# Patient Record
Sex: Male | Born: 1950
Health system: Southern US, Community
[De-identification: ages and names within clinical notes are randomized; demographics above are authoritative.]

## PROBLEM LIST (undated history)

## (undated) DIAGNOSIS — F329 Major depressive disorder, single episode, unspecified: Secondary | ICD-10-CM

## (undated) DIAGNOSIS — N189 Chronic kidney disease, unspecified: Secondary | ICD-10-CM

## (undated) DIAGNOSIS — Z9289 Personal history of other medical treatment: Secondary | ICD-10-CM

## (undated) DIAGNOSIS — E785 Hyperlipidemia, unspecified: Secondary | ICD-10-CM

## (undated) DIAGNOSIS — D472 Monoclonal gammopathy: Secondary | ICD-10-CM

## (undated) DIAGNOSIS — E669 Obesity, unspecified: Secondary | ICD-10-CM

## (undated) DIAGNOSIS — K589 Irritable bowel syndrome without diarrhea: Secondary | ICD-10-CM

## (undated) DIAGNOSIS — B009 Herpesviral infection, unspecified: Secondary | ICD-10-CM

## (undated) DIAGNOSIS — I10 Essential (primary) hypertension: Secondary | ICD-10-CM

## (undated) DIAGNOSIS — F419 Anxiety disorder, unspecified: Secondary | ICD-10-CM

## (undated) DIAGNOSIS — K219 Gastro-esophageal reflux disease without esophagitis: Secondary | ICD-10-CM

## (undated) DIAGNOSIS — C801 Malignant (primary) neoplasm, unspecified: Secondary | ICD-10-CM

## (undated) DIAGNOSIS — R519 Headache, unspecified: Secondary | ICD-10-CM

## (undated) DIAGNOSIS — R413 Other amnesia: Secondary | ICD-10-CM

## (undated) DIAGNOSIS — M109 Gout, unspecified: Secondary | ICD-10-CM

## (undated) DIAGNOSIS — F32A Depression, unspecified: Secondary | ICD-10-CM

## (undated) DIAGNOSIS — R350 Frequency of micturition: Secondary | ICD-10-CM

## (undated) DIAGNOSIS — I5030 Unspecified diastolic (congestive) heart failure: Secondary | ICD-10-CM

## (undated) DIAGNOSIS — T7840XA Allergy, unspecified, initial encounter: Secondary | ICD-10-CM

## (undated) DIAGNOSIS — I639 Cerebral infarction, unspecified: Secondary | ICD-10-CM

## (undated) DIAGNOSIS — R7303 Prediabetes: Secondary | ICD-10-CM

## (undated) DIAGNOSIS — R3915 Urgency of urination: Secondary | ICD-10-CM

## (undated) DIAGNOSIS — M1990 Unspecified osteoarthritis, unspecified site: Secondary | ICD-10-CM

## (undated) HISTORY — PX: VASECTOMY: SHX75

## (undated) HISTORY — PX: TONSILLECTOMY: SUR1361

## (undated) HISTORY — DX: Chronic kidney disease, unspecified: N18.9

## (undated) HISTORY — DX: Anxiety disorder, unspecified: F41.9

## (undated) HISTORY — DX: Gastro-esophageal reflux disease without esophagitis: K21.9

## (undated) HISTORY — DX: Hyperlipidemia, unspecified: E78.5

## (undated) HISTORY — PX: SPINAL FUSION: SHX223

## (undated) HISTORY — PX: WISDOM TOOTH EXTRACTION: SHX21

## (undated) HISTORY — PX: OTHER SURGICAL HISTORY: SHX169

## (undated) HISTORY — DX: Allergy, unspecified, initial encounter: T78.40XA

## (undated) HISTORY — DX: Obesity, unspecified: E66.9

## (undated) HISTORY — DX: Prediabetes: R73.03

## (undated) HISTORY — DX: Personal history of other medical treatment: Z92.89

## (undated) HISTORY — DX: Other amnesia: R41.3

## (undated) HISTORY — DX: Urgency of urination: R39.15

## (undated) HISTORY — DX: Frequency of micturition: R35.0

## (undated) HISTORY — DX: Monoclonal gammopathy: D47.2

## (undated) HISTORY — DX: Gout, unspecified: M10.9

---

## 2007-12-19 ENCOUNTER — Encounter: Admission: RE | Admit: 2007-12-19 | Discharge: 2007-12-19 | Payer: Self-pay | Admitting: Orthopedic Surgery

## 2008-01-30 ENCOUNTER — Ambulatory Visit: Payer: Self-pay | Admitting: *Deleted

## 2008-01-30 ENCOUNTER — Inpatient Hospital Stay (HOSPITAL_COMMUNITY): Admission: RE | Admit: 2008-01-30 | Discharge: 2008-02-01 | Payer: Self-pay | Admitting: Orthopedic Surgery

## 2008-01-31 ENCOUNTER — Encounter (INDEPENDENT_AMBULATORY_CARE_PROVIDER_SITE_OTHER): Payer: Self-pay | Admitting: Orthopedic Surgery

## 2009-12-19 ENCOUNTER — Inpatient Hospital Stay (HOSPITAL_COMMUNITY)
Admission: EM | Admit: 2009-12-19 | Discharge: 2009-12-23 | Payer: Self-pay | Source: Home / Self Care | Attending: Neurology | Admitting: Neurology

## 2009-12-21 ENCOUNTER — Encounter (INDEPENDENT_AMBULATORY_CARE_PROVIDER_SITE_OTHER): Payer: Self-pay | Admitting: Neurology

## 2010-02-08 ENCOUNTER — Encounter: Payer: Self-pay | Admitting: Orthopedic Surgery

## 2010-03-30 LAB — CK TOTAL AND CKMB (NOT AT ARMC)
CK, MB: 1.4 ng/mL (ref 0.3–4.0)
Relative Index: 0.8 (ref 0.0–2.5)
Total CK: 175 U/L (ref 7–232)

## 2010-03-30 LAB — GLUCOSE, CAPILLARY
Glucose-Capillary: 113 mg/dL — ABNORMAL HIGH (ref 70–99)
Glucose-Capillary: 94 mg/dL (ref 70–99)
Glucose-Capillary: 99 mg/dL (ref 70–99)

## 2010-03-30 LAB — COMPREHENSIVE METABOLIC PANEL
ALT: 32 U/L (ref 0–53)
AST: 28 U/L (ref 0–37)
Albumin: 4.2 g/dL (ref 3.5–5.2)
Alkaline Phosphatase: 45 U/L (ref 39–117)
BUN: 7 mg/dL (ref 6–23)
CO2: 22 mEq/L (ref 19–32)
Calcium: 8.7 mg/dL (ref 8.4–10.5)
Chloride: 100 mEq/L (ref 96–112)
Creatinine, Ser: 0.9 mg/dL (ref 0.4–1.5)
GFR calc Af Amer: >60 mL/min (ref >60)
GFR calc non Af Amer: >60 mL/min (ref >60)
Glucose, Bld: 100 mg/dL — ABNORMAL HIGH (ref 70–99)
Potassium: 3.7 mEq/L (ref 3.5–5.1)
Sodium: 136 mEq/L (ref 135–145)
Total Bilirubin: 1.1 mg/dL (ref 0.3–1.2)
Total Protein: 7.1 g/dL (ref 6.0–8.3)

## 2010-03-30 LAB — PROTIME-INR
INR: 1.04 (ref 0.00–1.49)
Prothrombin Time: 13.8 seconds (ref 11.6–15.2)

## 2010-03-30 LAB — LIPID PANEL
Cholesterol: 226 mg/dL — ABNORMAL HIGH (ref 0–200)
HDL: 28 mg/dL — ABNORMAL LOW (ref >39)
LDL Cholesterol: 149 mg/dL — ABNORMAL HIGH (ref 0–99)
Total CHOL/HDL Ratio: 8.1 RATIO
Triglycerides: 244 mg/dL — ABNORMAL HIGH (ref <150)
VLDL: 49 mg/dL — ABNORMAL HIGH (ref 0–40)

## 2010-03-30 LAB — CBC
HCT: 41.9 % (ref 39.0–52.0)
Hemoglobin: 14.7 g/dL (ref 13.0–17.0)
MCH: 30.6 pg (ref 26.0–34.0)
MCHC: 35.1 g/dL (ref 30.0–36.0)
MCV: 87.1 fL (ref 78.0–100.0)
Platelets: 298 10*3/uL (ref 150–400)
RBC: 4.81 MIL/uL (ref 4.22–5.81)
RDW: 12.5 % (ref 11.5–15.5)
WBC: 5.8 10*3/uL (ref 4.0–10.5)

## 2010-03-30 LAB — DIFFERENTIAL
Basophils Absolute: 0 10*3/uL (ref 0.0–0.1)
Basophils Relative: 0 % (ref 0–1)
Eosinophils Absolute: 0.1 10*3/uL (ref 0.0–0.7)
Eosinophils Relative: 2 % (ref 0–5)
Lymphocytes Relative: 34 % (ref 12–46)
Lymphs Abs: 2 10*3/uL (ref 0.7–4.0)
Monocytes Absolute: 0.6 10*3/uL (ref 0.1–1.0)
Monocytes Relative: 10 % (ref 3–12)
Neutro Abs: 3.2 10*3/uL (ref 1.7–7.7)
Neutrophils Relative %: 55 % (ref 43–77)

## 2010-03-30 LAB — MRSA PCR SCREENING: MRSA by PCR: NEGATIVE

## 2010-03-30 LAB — APTT: aPTT: 34 seconds (ref 24–37)

## 2010-03-30 LAB — TROPONIN I: Troponin I: 0.01 ng/mL (ref 0.00–0.06)

## 2010-03-30 LAB — HEMOGLOBIN A1C
Hgb A1c MFr Bld: 5.8 % — ABNORMAL HIGH (ref <5.7)
Mean Plasma Glucose: 120 mg/dL — ABNORMAL HIGH (ref <117)

## 2010-05-03 LAB — BASIC METABOLIC PANEL
BUN: 10 mg/dL (ref 6–23)
BUN: 11 mg/dL (ref 6–23)
BUN: 15 mg/dL (ref 6–23)
CO2: 26 mEq/L (ref 19–32)
CO2: 27 mEq/L (ref 19–32)
CO2: 28 mEq/L (ref 19–32)
Calcium: 8.3 mg/dL — ABNORMAL LOW (ref 8.4–10.5)
Calcium: 8.8 mg/dL (ref 8.4–10.5)
Calcium: 9.4 mg/dL (ref 8.4–10.5)
Chloride: 101 mEq/L (ref 96–112)
Chloride: 105 mEq/L (ref 96–112)
Chloride: 99 mEq/L (ref 96–112)
Creatinine, Ser: 0.93 mg/dL (ref 0.4–1.5)
Creatinine, Ser: 0.97 mg/dL (ref 0.4–1.5)
Creatinine, Ser: 0.98 mg/dL (ref 0.4–1.5)
GFR calc Af Amer: >60 mL/min (ref >60)
GFR calc Af Amer: >60 mL/min (ref >60)
GFR calc Af Amer: >60 mL/min (ref >60)
GFR calc non Af Amer: >60 mL/min (ref >60)
GFR calc non Af Amer: >60 mL/min (ref >60)
GFR calc non Af Amer: >60 mL/min (ref >60)
Glucose, Bld: 113 mg/dL — ABNORMAL HIGH (ref 70–99)
Glucose, Bld: 137 mg/dL — ABNORMAL HIGH (ref 70–99)
Glucose, Bld: 65 mg/dL — ABNORMAL LOW (ref 70–99)
Potassium: 3.5 mEq/L (ref 3.5–5.1)
Potassium: 3.9 mEq/L (ref 3.5–5.1)
Potassium: 4.1 mEq/L (ref 3.5–5.1)
Sodium: 137 mEq/L (ref 135–145)
Sodium: 137 mEq/L (ref 135–145)
Sodium: 138 mEq/L (ref 135–145)

## 2010-05-03 LAB — CBC
HCT: 35.1 % — ABNORMAL LOW (ref 39.0–52.0)
HCT: 35.5 % — ABNORMAL LOW (ref 39.0–52.0)
HCT: 46.7 % (ref 39.0–52.0)
Hemoglobin: 12.3 g/dL — ABNORMAL LOW (ref 13.0–17.0)
Hemoglobin: 12.4 g/dL — ABNORMAL LOW (ref 13.0–17.0)
Hemoglobin: 15.9 g/dL (ref 13.0–17.0)
MCHC: 34.1 g/dL (ref 30.0–36.0)
MCHC: 34.6 g/dL (ref 30.0–36.0)
MCHC: 35.2 g/dL (ref 30.0–36.0)
MCV: 90.1 fL (ref 78.0–100.0)
MCV: 90.9 fL (ref 78.0–100.0)
MCV: 90.9 fL (ref 78.0–100.0)
Platelets: 298 10*3/uL (ref 150–400)
Platelets: 305 10*3/uL (ref 150–400)
Platelets: 368 10*3/uL (ref 150–400)
RBC: 3.9 MIL/uL — ABNORMAL LOW (ref 4.22–5.81)
RBC: 3.9 MIL/uL — ABNORMAL LOW (ref 4.22–5.81)
RBC: 5.14 MIL/uL (ref 4.22–5.81)
RDW: 12.7 % (ref 11.5–15.5)
RDW: 12.7 % (ref 11.5–15.5)
RDW: 12.9 % (ref 11.5–15.5)
WBC: 11.1 10*3/uL — ABNORMAL HIGH (ref 4.0–10.5)
WBC: 5.5 10*3/uL (ref 4.0–10.5)
WBC: 9.9 10*3/uL (ref 4.0–10.5)

## 2010-05-03 LAB — TYPE AND SCREEN
ABO/RH(D): A POS
Antibody Screen: NEGATIVE

## 2010-05-03 LAB — ABO/RH: ABO/RH(D): A POS

## 2010-06-01 NOTE — Op Note (Signed)
[name], TOMPSON NO.:  [id number]   MEDICAL RECORD NO.:  [id number]          PATIENT TYPE:  INP   LOCATION:  5017                         FACILITY:  [facility]   PHYSICIAN:  Alvy Beal, MD    DATE OF BIRTH:  [birth year]   DATE OF PROCEDURE:  01/30/2008  DATE OF DISCHARGE:                               OPERATIVE REPORT   PREOPERATIVE DIAGNOSIS:  Degenerative lumbar disk disease, L5-S1.   POSTOPERATIVE DIAGNOSIS:  Degenerative lumbar disk disease, L5-S1.   OPERATIVE PROCEDURE:  Anterior lumbar interbody fusion.   APPROACH SURGEON:  Balinda Quails, MD   FIRST ASSISTANT:  Crissie Reese, PA   HISTORY:  This is a very pleasant 60 year old gentleman who presents to  my care suffering with longstanding low back pain.  Attempts at  conservative management consisting of various types of injection  therapy, chiropractic treatment, and physical therapy had failed to  alleviate his symptoms.  As a result of progression of his pain, he  elected to proceed with surgery.  All appropriate risks, benefits, and  alternatives of surgery were discussed with the patient and consent was  obtained.   OPERATIVE NOTE:  The patient was brought to the operating room and  placed supine on the operating table.  After successful induction of  general anesthesia and endotracheal intubation, TEDs, SCDs, and Foley  were applied.  The anterior abdomen was then properly positioned and the  abdomen was prepped and draped in appropriate standard fashion.  Please  refer to Dr. Madilyn Fireman' note for specifics on the approach.   Once the self-retaining Thompson retractor blades were positioned and  the retroperitoneal approach was completed, I scrubbed into the case.  At this point, the iliac vessels were properly mobilized and I could  clearly visualize the L5-S1 disk space.  I confirmed with lateral x-ray  that this was the appropriate level.  I then incised the annulus with a  10-blade  scalpel.  I then used a curette, Kerrison rongeurs, and  pituitary rongeurs to remove the bulk of the disk material.  Once I had  mostly diskectomy completed, I then distracted the interbody space and  used fine curettes to continue my diskectomy work posteriorly.  I then  identified the posterior annulus and the posterior aspect of the  vertebral bodies.  At this point, I used a fine curette to get behind  the posterior body of L5 and S1 and stripped the annulus.  I then used a  3-mm long-handle Kerrison rongeur to resect the posterior osteophytes  from the vertebral bodies of L5 and S1.  I then went into the lateral  gutter to ensure that I had a complete diskectomy and I resected any of  the overhanging osteophytes.  At this point with the diskectomy  complete, I irrigated copiously with normal saline.   At this point, I rasped the endplates appropriately and measured the  interbody space.  I elected to use based on the trial prosthesis a 14, 8-  degree lordotic Stryker interbody PEEK cage.  I then obtained the  appropriate size cage, packed with Actifuse and used the atraumatic  pusher to insert the device.  I was able to countersink at approximately  6 mm.  I then checked the posterior depth to ensure that it was not too  far posteriorly.   At this point with the graft properly positioned, I then irrigated  copiously with normal saline.  I then took the RSB size 15 anterior zero  profile plating system and trialed it over the prosthesis.  I then took  the remaining portion of Actifuse, packed it anterior to the graft, and  then put the plate on.  I then used an awl to pop through the endplate  and I placed a 2.5 screw.  I placed 2 superior and 1 inferiorly.  I had  excellent fixation and all screws had excellent purchase.  I then took  the locking caps and secured it over.   At this point in time, I irrigated it copiously and then sequentially  removed the self-retaining  retractors to ensure there was no  hemorrhaging.  With the wound being dry, I then closed the fascia of the  rectus sheath with a running #1 Vicryl suture, superficial with 2-0, and  a 3-0 Monocryl for the skin.  Steri-Strips and a dry dressing were  applied.  Intraoperative final AP and lateral x-rays showed satisfactory  position of the graft and hardware and a final AP digital view indicated  that there was no surgical equipment left in the wound other than the  appropriate hardware that I had placed.   At the end of the case, the patient was extubated and transferred to  PACU without incident.      Alvy Beal, MD  Electronically Signed     DDB/MEDQ  D:  01/30/2008  T:  01/30/2008  Job:  [id number]

## 2010-10-17 ENCOUNTER — Other Ambulatory Visit: Payer: Self-pay

## 2010-10-17 ENCOUNTER — Emergency Department (INDEPENDENT_AMBULATORY_CARE_PROVIDER_SITE_OTHER): Payer: BC Managed Care – PPO

## 2010-10-17 ENCOUNTER — Emergency Department (HOSPITAL_BASED_OUTPATIENT_CLINIC_OR_DEPARTMENT_OTHER)
Admission: EM | Admit: 2010-10-17 | Discharge: 2010-10-17 | Disposition: A | Discharge status: Home / Self Care | Payer: BC Managed Care – PPO | Attending: Emergency Medicine | Admitting: Emergency Medicine

## 2010-10-17 ENCOUNTER — Encounter: Payer: Self-pay | Admitting: *Deleted

## 2010-10-17 DIAGNOSIS — R209 Unspecified disturbances of skin sensation: Secondary | ICD-10-CM

## 2010-10-17 DIAGNOSIS — R4182 Altered mental status, unspecified: Secondary | ICD-10-CM

## 2010-10-17 DIAGNOSIS — I6789 Other cerebrovascular disease: Secondary | ICD-10-CM

## 2010-10-17 DIAGNOSIS — G319 Degenerative disease of nervous system, unspecified: Secondary | ICD-10-CM

## 2010-10-17 DIAGNOSIS — Z79899 Other long term (current) drug therapy: Secondary | ICD-10-CM | POA: Insufficient documentation

## 2010-10-17 DIAGNOSIS — G459 Transient cerebral ischemic attack, unspecified: Secondary | ICD-10-CM

## 2010-10-17 HISTORY — DX: Cerebral infarction, unspecified: I63.9

## 2010-10-17 HISTORY — DX: Depression, unspecified: F32.A

## 2010-10-17 HISTORY — DX: Herpesviral infection, unspecified: B00.9

## 2010-10-17 HISTORY — DX: Major depressive disorder, single episode, unspecified: F32.9

## 2010-10-17 HISTORY — DX: Essential (primary) hypertension: I10

## 2010-10-17 LAB — CBC
HCT: 38.8 % — ABNORMAL LOW (ref 39.0–52.0)
Hemoglobin: 13.5 g/dL (ref 13.0–17.0)
MCH: 30.3 pg (ref 26.0–34.0)
MCHC: 34.8 g/dL (ref 30.0–36.0)
MCV: 87.2 fL (ref 78.0–100.0)
Platelets: 295 10*3/uL (ref 150–400)
RBC: 4.45 MIL/uL (ref 4.22–5.81)
RDW: 11.9 % (ref 11.5–15.5)
WBC: 6.5 10*3/uL (ref 4.0–10.5)

## 2010-10-17 LAB — PROTIME-INR
INR: 1.03 (ref 0.00–1.49)
Prothrombin Time: 13.7 seconds (ref 11.6–15.2)

## 2010-10-17 LAB — BASIC METABOLIC PANEL
BUN: 17 mg/dL (ref 6–23)
CO2: 28 mEq/L (ref 19–32)
Calcium: 9.2 mg/dL (ref 8.4–10.5)
Chloride: 102 mEq/L (ref 96–112)
Creatinine, Ser: 1.2 mg/dL (ref 0.50–1.35)
GFR calc Af Amer: >60 mL/min (ref >60)
GFR calc non Af Amer: >60 mL/min (ref >60)
Glucose, Bld: 102 mg/dL — ABNORMAL HIGH (ref 70–99)
Potassium: 3.3 mEq/L — ABNORMAL LOW (ref 3.5–5.1)
Sodium: 139 mEq/L (ref 135–145)

## 2010-10-17 NOTE — ED Notes (Signed)
Tingling and numbness on right side, but more in his arm. Went to bed at 11pm and woke up appx 1am with symptoms.

## 2010-10-17 NOTE — ED Provider Notes (Signed)
History     CSN: [id number] Arrival date & time: 10/17/2010  2:56 AM  Chief Complaint  Patient presents with  . Numbness    (Consider location/radiation/quality/duration/timing/severity/associated sxs/prior treatment) HPI Patient reports the development of numbness and tingling of his right arm and his right leg 11 PM on 10/16/2010 approximately 4 hours prior to arrival.  He reports a history of a stroke affecting his left side and developed left-sided weakness in December 2011 at which point TPA was given with resolution of his symptoms.  He is an intolerance to aspirin therefore is on 75 mg of Plavix daily and has been tolerating this and taking it as prescribed.  He reports he had similar symptoms approximately 2 days ago they were transient in nature and resolved on their own.  Given the development of his symptoms again this evening he decided to seek care in the emergency department.  He reports a numbness and tingling is still present.  Family with him denies facial asymmetry speech difficulties or confusion.  Patient denies any weakness of his upper extremities or lower extremities.  His wife reports no facial droop.  No ataxia described the patient will report by spouse.  Patient denies headache or chest pain.  He denies shortness of breath or palpitations.  Denies neck pain denies abdominal pain.  Denies fever chills nausea vomiting or diarrhea.   Past Medical History  Diagnosis Date  . Stroke   . Depression   . Hypertension   . Herpes     Past Surgical History  Procedure Date  . Spinal fusion   . Vasectomy     No family history on file.  History  Substance Use Topics  . Smoking status: Never Smoker   . Smokeless tobacco: Not on file  . Alcohol Use: No      Review of Systems  All other systems reviewed and are negative.    Allergies  Aspirin and Oxycodone  Home Medications   Current Outpatient Rx  Name Route Sig Dispense Refill  . AMLODIPINE BESYLATE 5 MG  PO TABS Oral Take 5 mg by mouth daily.      . BUSPIRONE HCL 15 MG PO TABS Oral Take 15 mg by mouth 3 (three) times daily.      Marland Kitchen CLOPIDOGREL BISULFATE 75 MG PO TABS Oral Take 75 mg by mouth daily.      . ETONOGESTREL-ETHINYL ESTRADIOL 0.12-0.015 MG/24HR VA RING Vaginal Place 1 each vaginally every 28 (twenty-eight) days. Insert vaginally and leave in place for 3 consecutive weeks, then remove for 1 week.     Marland Kitchen HYDROXYZINE HCL 25 MG PO TABS Oral Take 25 mg by mouth 3 (three) times daily as needed.      Marland Kitchen OMEPRAZOLE 20 MG PO CPDR Oral Take 20 mg by mouth daily.      Marland Kitchen PAROXETINE HCL 40 MG PO TABS Oral Take 60 mg by mouth every morning.      Marland Kitchen QUETIAPINE FUMARATE 100 MG PO TABS Oral Take 150 mg by mouth at bedtime.      Marland Kitchen VALACYCLOVIR HCL 1 G PO TABS Oral Take 1,000 mg by mouth 2 (two) times daily.        BP 120/73  Pulse 74  Temp(Src) 97.7 F (36.5 C) (Oral)  Resp 19  SpO2 97%  Physical Exam  Nursing note and vitals reviewed. Constitutional: He is oriented to person, place, and time. He appears well-developed and well-nourished.  HENT:  Head: Normocephalic and atraumatic.  Eyes:  EOM are normal. Pupils are equal, round, and reactive to light.  Neck: Normal range of motion.  Cardiovascular: Normal rate and regular rhythm.   Pulmonary/Chest: Effort normal.  Abdominal: Soft.  Neurological: He is alert and oriented to person, place, and time.       5/5 strength in major muscle groups of  bilateral upper and lower extremities. Speech normal. No facial asymetry.  Normal finger to nose bilaterally.  Patient does report decreased sensation to fine touch on his right upper lower extremity  Skin: Skin is warm and dry.  Psychiatric: He has a normal mood and affect.    ED Course  Procedures (including critical care time)  Labs Reviewed  CBC - Abnormal; Notable for the following:    HCT 38.8 (*)    All other components within normal limits  BASIC METABOLIC PANEL - Abnormal; Notable for the  following:    Potassium 3.3 (*)    Glucose, Bld 102 (*)    All other components within normal limits  PROTIME-INR   Ct Head Wo Contrast  10/17/2010  *RADIOLOGY REPORT*  Clinical Data: Altered mental status.  Tingling and numbness on the right side.  CT HEAD WITHOUT CONTRAST  Technique:  Contiguous axial images were obtained from the base of the skull through the vertex without contrast.  Comparison: 12/19/2009  Findings: The ventricles and sulci appear symmetrical.  No mass effect or midline shift.  No significant ventricular dilatation. Mild diffuse cerebral atrophy.  Low attenuation changes in the deep white matter suggesting small vessel ischemia.  Gray-white matter junctions are distinct.  Basal cisterns are not effaced.  No abnormal extra-axial fluid collections.  No evidence of acute intracranial hemorrhage.  No depressed skull fractures.  Visualized paranasal sinuses are not opacified.  IMPRESSION: Mild atrophy and small vessel ischemic change.  No evidence of acute intracranial hemorrhage, mass lesion, or acute infarct.  Original Report Authenticated By: Marlon Pel, M.D.     1. TIA (transient ischemic attack)      Date: 10/17/2010  Rate: 68  Rhythm: normal sinus rhythm  QRS Axis: normal  Intervals: normal  ST/T Wave abnormalities: normal  Conduction Disutrbances:none  Narrative Interpretation:   Old EKG Reviewed: unchanged    MDM  This is just over 4 hours out from the onset of his possible strokelike symptoms.  He is not a candidate for TPA at this time given his NIH stroke scale of 1 and greater than 3 hours.  EKG demonstrates normal sinus rhythm.  We'll obtain labs and head CT.   These are they on Plavix at this time denies smoking.  He reports well controlled hypertension hyperlipidemia.   4:45 AM Labs and head CT normal.  Patient reports resolution of all of his symptoms including numbness tingling.  He has developed no new symptoms since arrival to the emergency  department.  I reviewed the patient's prior hospitalization.  I spoke with the patient and his wife at length regarding the possibility of a TIA.  They understand.  He is currently on Plavix.  His carotid ultrasound during his last hospitalization demonstrated just mild plaque disease.  The patient and his spouse arrange to go home.  He has access to a car and they were both informed and intelligent individuals.  They will return immediately to [facility] emergency department if he develops any new or worsening symptoms.         Lyanne Co, MD 10/17/10 [phone]

## 2010-10-29 ENCOUNTER — Emergency Department (HOSPITAL_COMMUNITY)
Admission: EM | Admit: 2010-10-29 | Discharge: 2010-10-30 | Disposition: A | Discharge status: Other Acute Inpatient Hospital | Payer: BC Managed Care – PPO | Attending: Emergency Medicine | Admitting: Emergency Medicine

## 2010-10-29 ENCOUNTER — Ambulatory Visit (HOSPITAL_COMMUNITY)
Admission: RE | Admit: 2010-10-29 | Discharge: 2010-10-29 | Disposition: A | Discharge status: Home / Self Care | Payer: BC Managed Care – PPO | Source: Ambulatory Visit | Attending: Psychiatry | Admitting: Psychiatry

## 2010-10-29 DIAGNOSIS — Z79899 Other long term (current) drug therapy: Secondary | ICD-10-CM | POA: Insufficient documentation

## 2010-10-29 DIAGNOSIS — F329 Major depressive disorder, single episode, unspecified: Secondary | ICD-10-CM | POA: Insufficient documentation

## 2010-10-29 DIAGNOSIS — F3289 Other specified depressive episodes: Secondary | ICD-10-CM | POA: Insufficient documentation

## 2010-10-29 DIAGNOSIS — Z8673 Personal history of transient ischemic attack (TIA), and cerebral infarction without residual deficits: Secondary | ICD-10-CM | POA: Insufficient documentation

## 2010-10-29 DIAGNOSIS — F39 Unspecified mood [affective] disorder: Secondary | ICD-10-CM | POA: Insufficient documentation

## 2010-10-29 DIAGNOSIS — Z7902 Long term (current) use of antithrombotics/antiplatelets: Secondary | ICD-10-CM | POA: Insufficient documentation

## 2010-10-29 DIAGNOSIS — I10 Essential (primary) hypertension: Secondary | ICD-10-CM | POA: Insufficient documentation

## 2010-10-29 DIAGNOSIS — R45851 Suicidal ideations: Secondary | ICD-10-CM | POA: Insufficient documentation

## 2010-10-29 LAB — COMPREHENSIVE METABOLIC PANEL
ALT: 20 U/L (ref 0–53)
ALT: 17 U/L (ref 0–53)
AST: 19 U/L (ref 0–37)
AST: 16 U/L (ref 0–37)
Albumin: 4.1 g/dL (ref 3.5–5.2)
Albumin: 3.5 g/dL (ref 3.5–5.2)
Alkaline Phosphatase: 38 U/L — ABNORMAL LOW (ref 39–117)
Alkaline Phosphatase: 33 U/L — ABNORMAL LOW (ref 39–117)
BUN: 10 mg/dL (ref 6–23)
BUN: 11 mg/dL (ref 6–23)
CO2: 29 mEq/L (ref 19–32)
CO2: 30 mEq/L (ref 19–32)
Calcium: 9.2 mg/dL (ref 8.4–10.5)
Calcium: 8.8 mg/dL (ref 8.4–10.5)
Chloride: 101 mEq/L (ref 96–112)
Chloride: 102 mEq/L (ref 96–112)
Creatinine, Ser: 1 mg/dL (ref 0.50–1.35)
Creatinine, Ser: 1.04 mg/dL (ref 0.50–1.35)
GFR calc Af Amer: >90 mL/min (ref >90)
GFR calc Af Amer: 90 mL/min — ABNORMAL LOW (ref >90)
GFR calc non Af Amer: 81 mL/min — ABNORMAL LOW (ref >90)
GFR calc non Af Amer: 77 mL/min — ABNORMAL LOW (ref >90)
Glucose, Bld: 91 mg/dL (ref 70–99)
Glucose, Bld: 89 mg/dL (ref 70–99)
Potassium: 3 mEq/L — ABNORMAL LOW (ref 3.5–5.1)
Potassium: 3.1 mEq/L — ABNORMAL LOW (ref 3.5–5.1)
Sodium: 139 mEq/L (ref 135–145)
Sodium: 139 mEq/L (ref 135–145)
Total Bilirubin: 0.2 mg/dL — ABNORMAL LOW (ref 0.3–1.2)
Total Bilirubin: 0.2 mg/dL — ABNORMAL LOW (ref 0.3–1.2)
Total Protein: 7.6 g/dL (ref 6.0–8.3)
Total Protein: 6.7 g/dL (ref 6.0–8.3)

## 2010-10-29 LAB — DIFFERENTIAL
Basophils Absolute: 0 10*3/uL (ref 0.0–0.1)
Basophils Relative: 0 % (ref 0–1)
Eosinophils Absolute: 0.2 10*3/uL (ref 0.0–0.7)
Eosinophils Relative: 2 % (ref 0–5)
Lymphocytes Relative: 22 % (ref 12–46)
Lymphs Abs: 1.6 10*3/uL (ref 0.7–4.0)
Monocytes Absolute: 0.6 10*3/uL (ref 0.1–1.0)
Monocytes Relative: 8 % (ref 3–12)
Neutro Abs: 4.9 10*3/uL (ref 1.7–7.7)
Neutrophils Relative %: 68 % (ref 43–77)

## 2010-10-29 LAB — RAPID URINE DRUG SCREEN, HOSP PERFORMED
Amphetamines: NOT DETECTED
Barbiturates: NOT DETECTED
Benzodiazepines: POSITIVE — AB
Cocaine: NOT DETECTED
Opiates: NOT DETECTED
Tetrahydrocannabinol: NOT DETECTED

## 2010-10-29 LAB — CBC
HCT: 41.9 % (ref 39.0–52.0)
Hemoglobin: 14.2 g/dL (ref 13.0–17.0)
MCH: 29.6 pg (ref 26.0–34.0)
MCHC: 33.9 g/dL (ref 30.0–36.0)
MCV: 87.5 fL (ref 78.0–100.0)
Platelets: 339 10*3/uL (ref 150–400)
RBC: 4.79 MIL/uL (ref 4.22–5.81)
RDW: 12.3 % (ref 11.5–15.5)
WBC: 7.2 10*3/uL (ref 4.0–10.5)

## 2010-10-29 LAB — ETHANOL: Alcohol, Ethyl (B): <11 mg/dL (ref 0–11)

## 2010-10-29 LAB — CARBAMAZEPINE LEVEL, TOTAL: Carbamazepine Lvl: 2 ug/mL — ABNORMAL LOW (ref 4.0–12.0)

## 2010-10-30 ENCOUNTER — Inpatient Hospital Stay (HOSPITAL_COMMUNITY)
Admission: RE | Admit: 2010-10-30 | Discharge: 2010-11-08 | DRG: 430 | Disposition: A | Discharge status: Home / Self Care | Payer: BC Managed Care – PPO | Source: Ambulatory Visit | Attending: Psychiatry | Admitting: Psychiatry

## 2010-10-30 DIAGNOSIS — Z79899 Other long term (current) drug therapy: Secondary | ICD-10-CM

## 2010-10-30 DIAGNOSIS — Z8673 Personal history of transient ischemic attack (TIA), and cerebral infarction without residual deficits: Secondary | ICD-10-CM

## 2010-10-30 DIAGNOSIS — Z7902 Long term (current) use of antithrombotics/antiplatelets: Secondary | ICD-10-CM

## 2010-10-30 DIAGNOSIS — E785 Hyperlipidemia, unspecified: Secondary | ICD-10-CM

## 2010-10-30 DIAGNOSIS — I10 Essential (primary) hypertension: Secondary | ICD-10-CM

## 2010-10-30 DIAGNOSIS — F339 Major depressive disorder, recurrent, unspecified: Principal | ICD-10-CM

## 2010-10-30 DIAGNOSIS — F411 Generalized anxiety disorder: Secondary | ICD-10-CM

## 2010-10-30 DIAGNOSIS — I739 Peripheral vascular disease, unspecified: Secondary | ICD-10-CM

## 2010-10-30 DIAGNOSIS — R45851 Suicidal ideations: Secondary | ICD-10-CM

## 2010-10-31 DIAGNOSIS — F339 Major depressive disorder, recurrent, unspecified: Secondary | ICD-10-CM

## 2010-10-31 DIAGNOSIS — F411 Generalized anxiety disorder: Secondary | ICD-10-CM

## 2010-10-31 LAB — BASIC METABOLIC PANEL
BUN: 9 mg/dL (ref 6–23)
CO2: 30 mEq/L (ref 19–32)
Calcium: 9.2 mg/dL (ref 8.4–10.5)
Chloride: 102 mEq/L (ref 96–112)
Creatinine, Ser: 1.01 mg/dL (ref 0.50–1.35)
GFR calc Af Amer: >90 mL/min (ref >90)
GFR calc non Af Amer: 80 mL/min — ABNORMAL LOW (ref >90)
Glucose, Bld: 94 mg/dL (ref 70–99)
Potassium: 3.4 mEq/L — ABNORMAL LOW (ref 3.5–5.1)
Sodium: 139 mEq/L (ref 135–145)

## 2010-11-01 LAB — CBC
HCT: 39.5 % (ref 39.0–52.0)
Hemoglobin: 13.3 g/dL (ref 13.0–17.0)
MCH: 30 pg (ref 26.0–34.0)
MCHC: 33.7 g/dL (ref 30.0–36.0)
MCV: 89.2 fL (ref 78.0–100.0)
Platelets: 346 10*3/uL (ref 150–400)
RBC: 4.43 MIL/uL (ref 4.22–5.81)
RDW: 12 % (ref 11.5–15.5)
WBC: 7.9 10*3/uL (ref 4.0–10.5)

## 2010-11-01 LAB — DIFFERENTIAL
Basophils Absolute: 0 10*3/uL (ref 0.0–0.1)
Basophils Relative: 0 % (ref 0–1)
Eosinophils Absolute: 0.2 10*3/uL (ref 0.0–0.7)
Eosinophils Relative: 2 % (ref 0–5)
Lymphocytes Relative: 26 % (ref 12–46)
Lymphs Abs: 2.1 10*3/uL (ref 0.7–4.0)
Monocytes Absolute: 0.7 10*3/uL (ref 0.1–1.0)
Monocytes Relative: 9 % (ref 3–12)
Neutro Abs: 5 10*3/uL (ref 1.7–7.7)
Neutrophils Relative %: 63 % (ref 43–77)

## 2010-11-01 LAB — COMPREHENSIVE METABOLIC PANEL
ALT: 15 U/L (ref 0–53)
AST: 14 U/L (ref 0–37)
Albumin: 3.6 g/dL (ref 3.5–5.2)
Alkaline Phosphatase: 38 U/L — ABNORMAL LOW (ref 39–117)
BUN: 8 mg/dL (ref 6–23)
CO2: 29 mEq/L (ref 19–32)
Calcium: 8.9 mg/dL (ref 8.4–10.5)
Chloride: 102 mEq/L (ref 96–112)
Creatinine, Ser: 1.04 mg/dL (ref 0.50–1.35)
GFR calc Af Amer: 90 mL/min — ABNORMAL LOW (ref >90)
GFR calc non Af Amer: 77 mL/min — ABNORMAL LOW (ref >90)
Glucose, Bld: 101 mg/dL — ABNORMAL HIGH (ref 70–99)
Potassium: 3.2 mEq/L — ABNORMAL LOW (ref 3.5–5.1)
Sodium: 140 mEq/L (ref 135–145)
Total Bilirubin: 0.1 mg/dL — ABNORMAL LOW (ref 0.3–1.2)
Total Protein: 7.2 g/dL (ref 6.0–8.3)

## 2010-11-01 LAB — TSH: TSH: 2.46 u[IU]/mL (ref 0.350–4.500)

## 2010-11-01 LAB — T4, FREE: Free T4: 0.99 ng/dL (ref 0.80–1.80)

## 2010-11-01 LAB — T3, FREE: T3, Free: 2.6 pg/mL (ref 2.3–4.2)

## 2010-11-01 NOTE — Assessment & Plan Note (Signed)
NAMEMARQUON, [name] NO.:  [id number]  MEDICAL RECORD NO.:  [id number]  LOCATION:  0504                          [facility]ILITY[facility]  [facility]  PHYSICIAN:  Franchot Gallo, MD     DATE OF BIRTH:  Oct 19, 1950  DATE OF ADMISSION:  10/30/2010 DATE OF DISCHARGE:                      PSYCHIATRIC ADMISSION ASSESSMENT   This is a voluntary admission to the services of Dr. Harvie Heck Fabricio Endsley. This is a 60 year old married African American male.  He has been in outpatient care with Dr. Betti Cruz.  He has had a variety of med changes, especially since he was diagnosed with a stroke, and this would have been December 2011.  He suffered a right basal ganglia and corona radiata infarct secondary to small vessel disease.  He apparently he told his wife "he knew how to do it" and his new plan was to overdose on his meds.  He had already given his firearms to a fraternity brother months ago.  He has been having increased sadness and crying despite med changes and this current exacerbation seems to have been triggered by the purchase of a vehicle that he now regrets.  PAST PSYCHIATRIC HISTORY:  It is a little bit difficult to get him pinned down.  He kind of wanders around and gets lost here, but apparently 10 years ago he was admitted to a hospital in [place] for suicidal threat.  It is unclear what happened after that, but for the past 3 years he has been in outpatient care with Dr. Betti Cruz.  SOCIAL HISTORY:  He does have his masters in education.  He has been married once.  He has a daughter, 49 and a stepson, 87.  He has been receiving SSDI since his stroke and he "hates it.Marland Kitchen  FAMILY HISTORY:  He says his father and brothers and his uncles were all alcoholics.  His own alcohol and drug history is not an issue.  MEDICAL PROBLEMS:  He is status post a right basal ganglia and corona radiata infarct secondary to small vessel disease.  This would have been this past December.  Hypertension,  hyperlipidemia.  He has a history for statin intolerance.  CURRENTLY PRESCRIBED MEDICINES:  He is prescribed: 1. Hydroxyzine 25 mg t.i.d. 2. Hydrochlorothiazide 25 mg p.o. q.a.m. 3. Equetro 300 mg at h.s. 4. Clopidogrel 75 mg 1 tab at h.s. 5. Clomid citrate 50 mg 1 tab p.o. q.a.m. 6. BuSpar 15 mg t.i.d. 7. Omeprazole 20 mg 1 p.o. daily . 8. Crestor 5 mg at h.s. 9. Valium 5 mg t.i.d. p.r.n. 10.Losartan 50 mg 1 p.o. q.a.m. 11.Mirtazapine 45 mg at h.s. 12.Paxil 60 mg at h.s.  DRUG ALLERGIES:  To aspirin; it causes shortness of breath.  POSITIVE PHYSICAL FINDINGS:  He was medically cleared in the ED at [facility].  He was afebrile, 98.1 to 98.7, his pulse was 61 to 75, respirations were 16 to 20, and blood pressure ranged from 103/61 to 112/76.  His UDS was positive for benzos, as expected, as he is prescribed.  His CBC had no abnormalities.  His BMET showed that his potassium was 3.0.  It was repeated; it was still 3.1 despite having been given a dose of potassium chloride.  He had no measurable alcohol.  MENTAL STATUS EXAMINATION:  He is seen in conjunction with Dr. Rogers Blocker.  He was well groomed, dressed, and nourished.  His speech was slow, vacillating with being somewhat hyperverbal at times.  His mood was very depressed.  His affect was congruent.  He was also easy to cry. Thought processes were somewhat clear, rational, goal oriented.  He wants to get better from the depression.  He wants to feel hopeful.  He would like to work again.  Judgment and insight are fair.  Concentration and memory superficially are intact.  Intelligence is at least average. He still feels that everybody would be better off without him.  He would still overdose on his prescribed meds if out of the hospital.  He is not homicidal and he is not having any auditory or visual hallucinations.  DIAGNOSES:  Axis I:  Mood disorder, not otherwise specified. Axis II:  None. Axis III:  History for stroke  December 2011, hypertension, hyperlipidemia, history for statin intolerance, and small vessel disease. Axis IV:  General, medical, and occupational issues. Axis V:  30.  PLAN:  To admit for safety and stabilization with medication adjustments.  Toward that end, Dr. Rogers Blocker wanted to decrease his Paxil to 20 mg p.o. daily, to start Wellbutrin 75 mg p.o. b.i.d., to stop the Equetro, and we will check his thyroid, TSH, T3, T4.  He is on medication for low T and he is not sure if he has ever had his thyroid checked.  Estimated length of stay is at least 4 to 5 days.     Mickie Leonarda Salon, P.A.-C.   ______________________________ Franchot Gallo, MD    MD/MEDQ  D:  10/31/2010  T:  11/01/2010  Job:  [id number]  Electronically Signed by Jaci Lazier ADAMS P.A.-C. on 11/01/2010 11:51:18 AM Electronically Signed by Franchot Gallo MD on 11/01/2010 01:25:44 PM

## 2010-11-02 LAB — TESTOSTERONE: Testosterone: 390.03 ng/dL (ref 250–890)

## 2010-11-06 LAB — COMPREHENSIVE METABOLIC PANEL
ALT: 10 U/L (ref 0–53)
AST: 11 U/L (ref 0–37)
Albumin: 3.2 g/dL — ABNORMAL LOW (ref 3.5–5.2)
Alkaline Phosphatase: 35 U/L — ABNORMAL LOW (ref 39–117)
BUN: 7 mg/dL (ref 6–23)
CO2: 31 mEq/L (ref 19–32)
Calcium: 8.9 mg/dL (ref 8.4–10.5)
Chloride: 104 mEq/L (ref 96–112)
Creatinine, Ser: 1.02 mg/dL (ref 0.50–1.35)
GFR calc Af Amer: >90 mL/min (ref >90)
GFR calc non Af Amer: 79 mL/min — ABNORMAL LOW (ref >90)
Glucose, Bld: 92 mg/dL (ref 70–99)
Potassium: 3.7 mEq/L (ref 3.5–5.1)
Sodium: 141 mEq/L (ref 135–145)
Total Bilirubin: 0.2 mg/dL — ABNORMAL LOW (ref 0.3–1.2)
Total Protein: 6.7 g/dL (ref 6.0–8.3)

## 2010-11-06 LAB — DIFFERENTIAL
Basophils Absolute: 0 10*3/uL (ref 0.0–0.1)
Basophils Relative: 0 % (ref 0–1)
Eosinophils Absolute: 0.2 10*3/uL (ref 0.0–0.7)
Eosinophils Relative: 3 % (ref 0–5)
Lymphocytes Relative: 30 % (ref 12–46)
Lymphs Abs: 2.1 10*3/uL (ref 0.7–4.0)
Monocytes Absolute: 0.7 10*3/uL (ref 0.1–1.0)
Monocytes Relative: 10 % (ref 3–12)
Neutro Abs: 3.9 10*3/uL (ref 1.7–7.7)
Neutrophils Relative %: 56 % (ref 43–77)

## 2010-11-06 LAB — CBC
HCT: 40.8 % (ref 39.0–52.0)
Hemoglobin: 13.7 g/dL (ref 13.0–17.0)
MCH: 30.2 pg (ref 26.0–34.0)
MCHC: 33.6 g/dL (ref 30.0–36.0)
MCV: 90.1 fL (ref 78.0–100.0)
Platelets: 375 10*3/uL (ref 150–400)
RBC: 4.53 MIL/uL (ref 4.22–5.81)
RDW: 11.9 % (ref 11.5–15.5)
WBC: 6.8 10*3/uL (ref 4.0–10.5)

## 2010-11-10 NOTE — Discharge Summary (Signed)
[name], [name] NO.:  [id number]  MEDICAL RECORD NO.:  [id number]  LOCATION:  0504                          [facility]ACILITY:  [facility]  PHYSICIAN:  Franchot Gallo, MD     DATE OF BIRTH:  12-14-1950  DATE OF ADMISSION:  10/30/2010 DATE OF DISCHARGE:  11/08/2010                              DISCHARGE SUMMARY   REASON FOR ADMISSION:  This was a 60 year old male who had recent medication changes and was having suicidal thoughts to overdose on his medications.  FINAL IMPRESSION:  Axis I:  Major depressive disorder, recurrent. Generalized anxiety disorder. Axis II:  Deferred. Axis III:  Status post right basal ganglia and __radiata infarct. Hypertension.  Hyperlipidemia. Axis IV:  Chronic health problems.  Limited social support. Axis V:  GAF at discharge is 70.  LABORATORIES:  Urine drug screen positive for benzodiazepines.  CBC:  No abnormalities.  BMET showed a potassium of 3.  No measurable alcohol.  SIGNIFICANT FINDINGS:  The patient was admitted for safety and stabilization.  Paxil was decreased.  We added Wellbutrin.  We stopped his Equetro and checked further labs.  The patient was participating in groups and reported paying for a car that he really did not want and it triggered his depression.  He was having problems with Wellbutrin as it was increasing his anxiety, and that was discontinued and we started Zoloft for depression and anxiety.  We also decreased his Valium as the patient was becoming over-sedated.  His sleep overall was good as well as his appetite, rating his depression a 3 on a scale of 1 to 10, his hopelessness a 1 on a scale of 1 to 10.  We increased his Zoloft at the time to 75 mg.  We added Neurontin for anxiety and continued to monitor his behavior and increased his Zoloft again to lessen his depression and anxiety.  The patient's potassium was 3.2, and the patient was given potassium supplements, Gatorade and vitamin C for cold  symptoms.  We had contact with the patient's wife to address safety issues and for [place] to provide information on suicide risk factors and prevention and intervention.  He was reporting crazy dreams, rating his hopelessness an 8 on a scale of 1 to 10.  He was also having some GERD symptoms and was started on Protonix.  He was beginning to improve, gaining the weight that he had lost prior.  Denied any suicidal or homicidal thoughts.  We also increased his Zoloft to 150 mg, discontinued his Valium and started him on Klonopin.  On the day of discharge, the patient was seen in the interdisciplinary treatment team.  We addressed his questions.  He had concerns about his insurance and payment.  He was reporting good sleep, however, good appetite, rating depressive symptoms a 4 on a scale of 1 to 10.  He adamantly denied any suicidal or homicidal thoughts or auditory hallucinations or delusional thinking, rating his anxiety a 6 on a scale of 1 to 10.  He was agreeable to going home and starting the IOP program.  His discharge medications included: 1. Klonopin 1 mg at 8:00, 2:00 and 10:00.  The patient was to  take one     last dose of his Valium before starting the Klonopin the following     day. 2. Gabapentin 300 mg t.i.d. 3. Remeron 15 mg q.h.s. 4. Zoloft 100 mg taking 1-1/2 tablets daily. 5. Clomid 50 mg 1 daily. 6. Plavix 75 mg daily. 7. Losartan 50 mg daily. 8. Omeprazole 20 mg for heartburn. 9. Crestor 5 mg daily.  The patient is to stop taking his Paxil and hydroxyzine.  His followup appointment was with Dr. Betti Cruz at [facility], phone number [phone].  He also had a therapy appointment with [facility] at [phone], the mental health IOP program with the outpatient clinic, to start on Wednesday, October 31st, at 8:30.     Landry Corporal, N.P.   ______________________________ Franchot Gallo, MD    JO/MEDQ  D:  11/09/2010  T:  11/09/2010  Job:   [id number]  Electronically Signed by Limmie PatriciaP. on 11/10/2010 10:20:22 AM Electronically Signed by Franchot Gallo MD on 11/10/2010 03:15:04 PM

## 2011-01-24 DIAGNOSIS — F331 Major depressive disorder, recurrent, moderate: Secondary | ICD-10-CM | POA: Diagnosis not present

## 2011-02-01 DIAGNOSIS — F331 Major depressive disorder, recurrent, moderate: Secondary | ICD-10-CM | POA: Diagnosis not present

## 2011-02-07 DIAGNOSIS — F331 Major depressive disorder, recurrent, moderate: Secondary | ICD-10-CM | POA: Diagnosis not present

## 2011-02-21 DIAGNOSIS — F331 Major depressive disorder, recurrent, moderate: Secondary | ICD-10-CM | POA: Diagnosis not present

## 2011-02-23 DIAGNOSIS — E785 Hyperlipidemia, unspecified: Secondary | ICD-10-CM | POA: Diagnosis not present

## 2011-02-23 DIAGNOSIS — F329 Major depressive disorder, single episode, unspecified: Secondary | ICD-10-CM | POA: Diagnosis not present

## 2011-02-23 DIAGNOSIS — F339 Major depressive disorder, recurrent, unspecified: Secondary | ICD-10-CM | POA: Diagnosis not present

## 2011-02-23 DIAGNOSIS — Z8673 Personal history of transient ischemic attack (TIA), and cerebral infarction without residual deficits: Secondary | ICD-10-CM | POA: Diagnosis not present

## 2011-02-23 DIAGNOSIS — I10 Essential (primary) hypertension: Secondary | ICD-10-CM | POA: Diagnosis not present

## 2011-02-23 DIAGNOSIS — I2510 Atherosclerotic heart disease of native coronary artery without angina pectoris: Secondary | ICD-10-CM | POA: Diagnosis not present

## 2011-02-23 DIAGNOSIS — F3289 Other specified depressive episodes: Secondary | ICD-10-CM | POA: Diagnosis not present

## 2011-03-02 DIAGNOSIS — F32A Depression, unspecified: Secondary | ICD-10-CM | POA: Insufficient documentation

## 2011-03-02 DIAGNOSIS — F603 Borderline personality disorder: Secondary | ICD-10-CM | POA: Insufficient documentation

## 2011-03-16 DIAGNOSIS — J019 Acute sinusitis, unspecified: Secondary | ICD-10-CM | POA: Diagnosis not present

## 2011-03-24 DIAGNOSIS — F0390 Unspecified dementia without behavioral disturbance: Secondary | ICD-10-CM | POA: Diagnosis not present

## 2011-03-28 DIAGNOSIS — F0390 Unspecified dementia without behavioral disturbance: Secondary | ICD-10-CM | POA: Diagnosis not present

## 2011-03-29 DIAGNOSIS — F0390 Unspecified dementia without behavioral disturbance: Secondary | ICD-10-CM | POA: Diagnosis not present

## 2011-03-31 DIAGNOSIS — R45851 Suicidal ideations: Secondary | ICD-10-CM | POA: Diagnosis not present

## 2011-03-31 DIAGNOSIS — F3289 Other specified depressive episodes: Secondary | ICD-10-CM | POA: Diagnosis not present

## 2011-03-31 DIAGNOSIS — Z87891 Personal history of nicotine dependence: Secondary | ICD-10-CM | POA: Diagnosis not present

## 2011-03-31 DIAGNOSIS — I10 Essential (primary) hypertension: Secondary | ICD-10-CM | POA: Diagnosis not present

## 2011-03-31 DIAGNOSIS — F329 Major depressive disorder, single episode, unspecified: Secondary | ICD-10-CM | POA: Diagnosis not present

## 2011-03-31 DIAGNOSIS — I2510 Atherosclerotic heart disease of native coronary artery without angina pectoris: Secondary | ICD-10-CM | POA: Diagnosis not present

## 2011-04-01 DIAGNOSIS — I10 Essential (primary) hypertension: Secondary | ICD-10-CM | POA: Diagnosis not present

## 2011-04-08 DIAGNOSIS — M79609 Pain in unspecified limb: Secondary | ICD-10-CM | POA: Diagnosis not present

## 2011-04-12 DIAGNOSIS — G589 Mononeuropathy, unspecified: Secondary | ICD-10-CM | POA: Diagnosis not present

## 2011-04-12 DIAGNOSIS — G9050 Complex regional pain syndrome I, unspecified: Secondary | ICD-10-CM | POA: Diagnosis not present

## 2011-04-21 DIAGNOSIS — J069 Acute upper respiratory infection, unspecified: Secondary | ICD-10-CM | POA: Diagnosis not present

## 2011-04-28 DIAGNOSIS — F411 Generalized anxiety disorder: Secondary | ICD-10-CM | POA: Diagnosis not present

## 2011-04-28 DIAGNOSIS — R259 Unspecified abnormal involuntary movements: Secondary | ICD-10-CM | POA: Diagnosis not present

## 2011-04-28 DIAGNOSIS — M545 Low back pain, unspecified: Secondary | ICD-10-CM | POA: Diagnosis not present

## 2011-04-28 DIAGNOSIS — R61 Generalized hyperhidrosis: Secondary | ICD-10-CM | POA: Diagnosis not present

## 2011-05-02 DIAGNOSIS — J309 Allergic rhinitis, unspecified: Secondary | ICD-10-CM | POA: Diagnosis not present

## 2011-05-02 DIAGNOSIS — J069 Acute upper respiratory infection, unspecified: Secondary | ICD-10-CM | POA: Diagnosis not present

## 2011-05-05 DIAGNOSIS — M545 Low back pain, unspecified: Secondary | ICD-10-CM | POA: Diagnosis not present

## 2011-05-05 DIAGNOSIS — G250 Essential tremor: Secondary | ICD-10-CM | POA: Diagnosis not present

## 2011-05-05 DIAGNOSIS — M542 Cervicalgia: Secondary | ICD-10-CM | POA: Diagnosis not present

## 2011-05-05 DIAGNOSIS — F411 Generalized anxiety disorder: Secondary | ICD-10-CM | POA: Diagnosis not present

## 2011-05-05 DIAGNOSIS — G252 Other specified forms of tremor: Secondary | ICD-10-CM | POA: Diagnosis not present

## 2011-05-05 DIAGNOSIS — R5383 Other fatigue: Secondary | ICD-10-CM | POA: Diagnosis not present

## 2011-05-05 DIAGNOSIS — R61 Generalized hyperhidrosis: Secondary | ICD-10-CM | POA: Diagnosis not present

## 2011-05-05 DIAGNOSIS — G609 Hereditary and idiopathic neuropathy, unspecified: Secondary | ICD-10-CM | POA: Diagnosis not present

## 2011-05-05 DIAGNOSIS — R5381 Other malaise: Secondary | ICD-10-CM | POA: Diagnosis not present

## 2011-05-09 DIAGNOSIS — I10 Essential (primary) hypertension: Secondary | ICD-10-CM | POA: Diagnosis not present

## 2011-05-09 DIAGNOSIS — K219 Gastro-esophageal reflux disease without esophagitis: Secondary | ICD-10-CM | POA: Diagnosis not present

## 2011-05-09 DIAGNOSIS — F329 Major depressive disorder, single episode, unspecified: Secondary | ICD-10-CM | POA: Diagnosis not present

## 2011-05-09 DIAGNOSIS — F3289 Other specified depressive episodes: Secondary | ICD-10-CM | POA: Diagnosis not present

## 2011-05-09 DIAGNOSIS — E785 Hyperlipidemia, unspecified: Secondary | ICD-10-CM | POA: Diagnosis not present

## 2011-05-23 DIAGNOSIS — D472 Monoclonal gammopathy: Secondary | ICD-10-CM | POA: Diagnosis not present

## 2011-08-04 DIAGNOSIS — F411 Generalized anxiety disorder: Secondary | ICD-10-CM | POA: Diagnosis not present

## 2011-08-04 DIAGNOSIS — G250 Essential tremor: Secondary | ICD-10-CM | POA: Diagnosis not present

## 2011-08-04 DIAGNOSIS — G609 Hereditary and idiopathic neuropathy, unspecified: Secondary | ICD-10-CM | POA: Diagnosis not present

## 2011-08-04 DIAGNOSIS — G252 Other specified forms of tremor: Secondary | ICD-10-CM | POA: Diagnosis not present

## 2011-08-04 DIAGNOSIS — M542 Cervicalgia: Secondary | ICD-10-CM | POA: Diagnosis not present

## 2011-08-31 DIAGNOSIS — F331 Major depressive disorder, recurrent, moderate: Secondary | ICD-10-CM | POA: Diagnosis not present

## 2011-09-07 DIAGNOSIS — F331 Major depressive disorder, recurrent, moderate: Secondary | ICD-10-CM | POA: Diagnosis not present

## 2011-09-21 DIAGNOSIS — F331 Major depressive disorder, recurrent, moderate: Secondary | ICD-10-CM | POA: Diagnosis not present

## 2011-09-28 DIAGNOSIS — F331 Major depressive disorder, recurrent, moderate: Secondary | ICD-10-CM | POA: Diagnosis not present

## 2011-10-06 DIAGNOSIS — E785 Hyperlipidemia, unspecified: Secondary | ICD-10-CM | POA: Diagnosis not present

## 2011-10-06 DIAGNOSIS — I10 Essential (primary) hypertension: Secondary | ICD-10-CM | POA: Diagnosis not present

## 2011-10-06 DIAGNOSIS — R259 Unspecified abnormal involuntary movements: Secondary | ICD-10-CM | POA: Diagnosis not present

## 2011-10-06 DIAGNOSIS — R498 Other voice and resonance disorders: Secondary | ICD-10-CM | POA: Diagnosis not present

## 2011-10-06 DIAGNOSIS — Z8673 Personal history of transient ischemic attack (TIA), and cerebral infarction without residual deficits: Secondary | ICD-10-CM | POA: Diagnosis not present

## 2011-10-06 DIAGNOSIS — G609 Hereditary and idiopathic neuropathy, unspecified: Secondary | ICD-10-CM | POA: Diagnosis not present

## 2011-10-10 DIAGNOSIS — F331 Major depressive disorder, recurrent, moderate: Secondary | ICD-10-CM | POA: Diagnosis not present

## 2011-10-19 DIAGNOSIS — I6350 Cerebral infarction due to unspecified occlusion or stenosis of unspecified cerebral artery: Secondary | ICD-10-CM | POA: Diagnosis not present

## 2011-10-19 DIAGNOSIS — Z8669 Personal history of other diseases of the nervous system and sense organs: Secondary | ICD-10-CM | POA: Diagnosis not present

## 2011-10-19 DIAGNOSIS — G250 Essential tremor: Secondary | ICD-10-CM | POA: Diagnosis not present

## 2011-10-24 DIAGNOSIS — F331 Major depressive disorder, recurrent, moderate: Secondary | ICD-10-CM | POA: Diagnosis not present

## 2011-10-31 DIAGNOSIS — F331 Major depressive disorder, recurrent, moderate: Secondary | ICD-10-CM | POA: Diagnosis not present

## 2011-11-16 DIAGNOSIS — F331 Major depressive disorder, recurrent, moderate: Secondary | ICD-10-CM | POA: Diagnosis not present

## 2011-11-30 DIAGNOSIS — F331 Major depressive disorder, recurrent, moderate: Secondary | ICD-10-CM | POA: Diagnosis not present

## 2011-12-06 DIAGNOSIS — M545 Low back pain, unspecified: Secondary | ICD-10-CM | POA: Diagnosis not present

## 2011-12-06 DIAGNOSIS — F329 Major depressive disorder, single episode, unspecified: Secondary | ICD-10-CM | POA: Diagnosis not present

## 2011-12-06 DIAGNOSIS — F3289 Other specified depressive episodes: Secondary | ICD-10-CM | POA: Diagnosis not present

## 2011-12-07 DIAGNOSIS — F331 Major depressive disorder, recurrent, moderate: Secondary | ICD-10-CM | POA: Diagnosis not present

## 2011-12-08 DIAGNOSIS — G252 Other specified forms of tremor: Secondary | ICD-10-CM | POA: Diagnosis not present

## 2011-12-08 DIAGNOSIS — G250 Essential tremor: Secondary | ICD-10-CM | POA: Diagnosis not present

## 2011-12-19 DIAGNOSIS — F331 Major depressive disorder, recurrent, moderate: Secondary | ICD-10-CM | POA: Diagnosis not present

## 2011-12-19 DIAGNOSIS — M5137 Other intervertebral disc degeneration, lumbosacral region: Secondary | ICD-10-CM | POA: Diagnosis not present

## 2011-12-30 DIAGNOSIS — M5137 Other intervertebral disc degeneration, lumbosacral region: Secondary | ICD-10-CM | POA: Diagnosis not present

## 2012-01-02 DIAGNOSIS — F331 Major depressive disorder, recurrent, moderate: Secondary | ICD-10-CM | POA: Diagnosis not present

## 2012-01-03 DIAGNOSIS — F331 Major depressive disorder, recurrent, moderate: Secondary | ICD-10-CM | POA: Diagnosis not present

## 2012-01-04 DIAGNOSIS — M5137 Other intervertebral disc degeneration, lumbosacral region: Secondary | ICD-10-CM | POA: Diagnosis not present

## 2012-01-09 DIAGNOSIS — M5137 Other intervertebral disc degeneration, lumbosacral region: Secondary | ICD-10-CM | POA: Diagnosis not present

## 2012-01-25 DIAGNOSIS — F331 Major depressive disorder, recurrent, moderate: Secondary | ICD-10-CM | POA: Diagnosis not present

## 2012-02-06 DIAGNOSIS — M5137 Other intervertebral disc degeneration, lumbosacral region: Secondary | ICD-10-CM | POA: Diagnosis not present

## 2012-02-06 DIAGNOSIS — F331 Major depressive disorder, recurrent, moderate: Secondary | ICD-10-CM | POA: Diagnosis not present

## 2012-02-13 DIAGNOSIS — F331 Major depressive disorder, recurrent, moderate: Secondary | ICD-10-CM | POA: Diagnosis not present

## 2012-02-20 DIAGNOSIS — F331 Major depressive disorder, recurrent, moderate: Secondary | ICD-10-CM | POA: Diagnosis not present

## 2012-03-05 DIAGNOSIS — F331 Major depressive disorder, recurrent, moderate: Secondary | ICD-10-CM | POA: Diagnosis not present

## 2012-03-12 DIAGNOSIS — M5137 Other intervertebral disc degeneration, lumbosacral region: Secondary | ICD-10-CM | POA: Diagnosis not present

## 2012-03-14 DIAGNOSIS — F331 Major depressive disorder, recurrent, moderate: Secondary | ICD-10-CM | POA: Diagnosis not present

## 2012-03-19 DIAGNOSIS — F332 Major depressive disorder, recurrent severe without psychotic features: Secondary | ICD-10-CM | POA: Diagnosis not present

## 2012-03-21 DIAGNOSIS — G252 Other specified forms of tremor: Secondary | ICD-10-CM | POA: Diagnosis not present

## 2012-03-21 DIAGNOSIS — G250 Essential tremor: Secondary | ICD-10-CM | POA: Diagnosis not present

## 2012-03-30 DIAGNOSIS — L2089 Other atopic dermatitis: Secondary | ICD-10-CM | POA: Diagnosis not present

## 2012-04-02 DIAGNOSIS — F332 Major depressive disorder, recurrent severe without psychotic features: Secondary | ICD-10-CM | POA: Diagnosis not present

## 2012-04-11 DIAGNOSIS — F332 Major depressive disorder, recurrent severe without psychotic features: Secondary | ICD-10-CM | POA: Diagnosis not present

## 2012-04-16 DIAGNOSIS — F331 Major depressive disorder, recurrent, moderate: Secondary | ICD-10-CM | POA: Diagnosis not present

## 2012-04-25 DIAGNOSIS — F331 Major depressive disorder, recurrent, moderate: Secondary | ICD-10-CM | POA: Diagnosis not present

## 2012-04-30 DIAGNOSIS — F331 Major depressive disorder, recurrent, moderate: Secondary | ICD-10-CM | POA: Diagnosis not present

## 2012-05-14 DIAGNOSIS — F331 Major depressive disorder, recurrent, moderate: Secondary | ICD-10-CM | POA: Diagnosis not present

## 2012-05-25 DIAGNOSIS — R110 Nausea: Secondary | ICD-10-CM | POA: Diagnosis not present

## 2012-05-28 DIAGNOSIS — IMO0002 Reserved for concepts with insufficient information to code with codable children: Secondary | ICD-10-CM | POA: Diagnosis not present

## 2012-05-28 DIAGNOSIS — M25569 Pain in unspecified knee: Secondary | ICD-10-CM | POA: Diagnosis not present

## 2012-05-28 DIAGNOSIS — M1710 Unilateral primary osteoarthritis, unspecified knee: Secondary | ICD-10-CM | POA: Diagnosis not present

## 2012-05-31 DIAGNOSIS — F331 Major depressive disorder, recurrent, moderate: Secondary | ICD-10-CM | POA: Diagnosis not present

## 2012-06-07 DIAGNOSIS — F331 Major depressive disorder, recurrent, moderate: Secondary | ICD-10-CM | POA: Diagnosis not present

## 2012-06-14 DIAGNOSIS — F331 Major depressive disorder, recurrent, moderate: Secondary | ICD-10-CM | POA: Diagnosis not present

## 2012-06-20 DIAGNOSIS — F331 Major depressive disorder, recurrent, moderate: Secondary | ICD-10-CM | POA: Diagnosis not present

## 2012-06-27 DIAGNOSIS — F331 Major depressive disorder, recurrent, moderate: Secondary | ICD-10-CM | POA: Diagnosis not present

## 2012-07-02 DIAGNOSIS — M1710 Unilateral primary osteoarthritis, unspecified knee: Secondary | ICD-10-CM | POA: Diagnosis not present

## 2012-07-05 DIAGNOSIS — F331 Major depressive disorder, recurrent, moderate: Secondary | ICD-10-CM | POA: Diagnosis not present

## 2012-07-09 DIAGNOSIS — F331 Major depressive disorder, recurrent, moderate: Secondary | ICD-10-CM | POA: Diagnosis not present

## 2012-07-19 DIAGNOSIS — F331 Major depressive disorder, recurrent, moderate: Secondary | ICD-10-CM | POA: Diagnosis not present

## 2012-07-23 ENCOUNTER — Other Ambulatory Visit (HOSPITAL_COMMUNITY): Payer: Self-pay | Admitting: Neurology

## 2012-07-23 NOTE — Telephone Encounter (Signed)
Medication discontinued per [facility]. Last OV says: Plan : Taper Inderal LA 60 mg alternate daily x 1 week then every third day x 1 week and stop.  

## 2012-07-24 DIAGNOSIS — F332 Major depressive disorder, recurrent severe without psychotic features: Secondary | ICD-10-CM | POA: Diagnosis not present

## 2012-08-01 DIAGNOSIS — F331 Major depressive disorder, recurrent, moderate: Secondary | ICD-10-CM | POA: Diagnosis not present

## 2012-08-10 DIAGNOSIS — M5412 Radiculopathy, cervical region: Secondary | ICD-10-CM | POA: Diagnosis not present

## 2012-08-18 ENCOUNTER — Other Ambulatory Visit (HOSPITAL_COMMUNITY): Payer: Self-pay | Admitting: Neurology

## 2012-08-20 NOTE — Telephone Encounter (Signed)
Medication discontinued per [facility]. Last OV says: Plan : Taper Inderal LA 60 mg alternate daily x 1 week then every third day x 1 week and stop.  

## 2012-08-22 ENCOUNTER — Other Ambulatory Visit (HOSPITAL_COMMUNITY): Payer: Self-pay | Admitting: Neurology

## 2012-08-22 DIAGNOSIS — F331 Major depressive disorder, recurrent, moderate: Secondary | ICD-10-CM | POA: Diagnosis not present

## 2012-08-23 NOTE — Telephone Encounter (Signed)
Medication discontinued per [facility]. Last OV says: Plan : Taper Inderal LA 60 mg alternate daily x 1 week then every third day x 1 week and stop.

## 2012-08-27 ENCOUNTER — Other Ambulatory Visit (HOSPITAL_COMMUNITY): Payer: Self-pay | Admitting: Neurology

## 2012-09-05 DIAGNOSIS — F331 Major depressive disorder, recurrent, moderate: Secondary | ICD-10-CM | POA: Diagnosis not present

## 2012-09-12 ENCOUNTER — Other Ambulatory Visit (HOSPITAL_COMMUNITY): Payer: Self-pay | Admitting: Neurology

## 2012-09-13 NOTE — Telephone Encounter (Signed)
Medication discontinued per [facility]. Last OV says: Plan : Taper Inderal LA 60 mg alternate daily x 1 week then every third day x 1 week and stop.  

## 2012-09-20 DIAGNOSIS — Z8673 Personal history of transient ischemic attack (TIA), and cerebral infarction without residual deficits: Secondary | ICD-10-CM | POA: Diagnosis not present

## 2012-09-20 DIAGNOSIS — E785 Hyperlipidemia, unspecified: Secondary | ICD-10-CM | POA: Diagnosis not present

## 2012-09-20 DIAGNOSIS — I10 Essential (primary) hypertension: Secondary | ICD-10-CM | POA: Diagnosis not present

## 2012-09-20 DIAGNOSIS — Z79899 Other long term (current) drug therapy: Secondary | ICD-10-CM | POA: Diagnosis not present

## 2012-09-21 ENCOUNTER — Ambulatory Visit (INDEPENDENT_AMBULATORY_CARE_PROVIDER_SITE_OTHER): Payer: BC Managed Care – PPO | Admitting: Nurse Practitioner

## 2012-09-21 ENCOUNTER — Encounter: Payer: Self-pay | Admitting: Nurse Practitioner

## 2012-09-21 VITALS — BP 145/97 | HR 67 | Temp 98.1°F | Ht 66.0 in | Wt 155.0 lb

## 2012-09-21 DIAGNOSIS — Z8669 Personal history of other diseases of the nervous system and sense organs: Secondary | ICD-10-CM

## 2012-09-21 DIAGNOSIS — G250 Essential tremor: Secondary | ICD-10-CM | POA: Diagnosis not present

## 2012-09-21 DIAGNOSIS — F411 Generalized anxiety disorder: Secondary | ICD-10-CM

## 2012-09-21 DIAGNOSIS — I6350 Cerebral infarction due to unspecified occlusion or stenosis of unspecified cerebral artery: Secondary | ICD-10-CM | POA: Diagnosis not present

## 2012-09-21 MED ORDER — CLOPIDOGREL BISULFATE 75 MG PO TABS: 75.0000 mg | ORAL_TABLET | Freq: Every day | ORAL | Status: DC

## 2012-09-21 NOTE — Progress Notes (Signed)
[facility]  PATIENT: [name] DOB: 12/16/1950   REASON FOR VISIT: follow up HISTORY FROM: patient   HISTORY OF PRESENT ILLNESS: 34 year African American male with upper extremity fine action tremors likely anxiety related. Benign essential tremor is possible though less likely. History of right brain subcortical infarct due to small vessel disease in December 2011 with no significant residual deficits. History of peripheral neuropathy diagnosis but no real clinical symptoms or signs on exam or on nerve conduction/EMG exam.. Vascular risk factors of hypertension and hyperlipidemia. New 50 llbs weightloss in 1 year without determined cause.   He returns for followup after his last visit on 12/08/11. He states his noticed some improvement in his tremors after starting the primidone which he takes 100 mg daily. He seems to be tolerating it well without any drowsiness, dryness of mouth or other side effects. He states payment on is helping him more than intra-and is wondering if he can come off the Inderal. He has no new complaints. He had lab work done on 12/08/11 which showed normal thyroid function tests and LFTs. He's had no new stroke or TIA symptoms. He remains on Plavix and is tolerating it well without side effects. He states his blood pressure is under good control and it is 131/80 today.  UPDATE 09/21/12 (LL): [name] returns for follow up today, last visit was on 03/21/12.  He was increased on Primidone 125 mg last visit for tremor and was supposed to taper off Propranolol, but he states that he was not aware that he was to stop it.  He states his tremor is about the same.  He is going through a tough time personally, he is separated from his wife.  He states that he was very depressed but is doing much better now.  He continues to take daily Plavix with no excessive bleeding or bruising.  HE states his blood pressure is usually well controlled but it is 145/97 in  office.  He states he is to have his labs checked at his PCP in 6 weeks.  He has no new complaints.  REVIEW OF SYSTEMS: Full 14 system review of systems performed and notable only for:  Constitutional: N/A  Cardiovascular: N/A  Ear/Nose/Throat: N/A  Skin: iching  Eyes: N/A  Respiratory: N/A  Gastroitestinal: N/A  Genitourinary: N/A Hematology/Lymphatic: N/A  Endocrine: N/A Musculoskeletal:N/A  Allergy/Immunology: N/A  Neurological: N/A Psychiatric: N/A Sleep: N/A   ALLERGIES: Allergies  Allergen Reactions  . Aspirin   . Lisinopril     critical  . Lithium     critical  . Oxycodone     HOME MEDICATIONS: Outpatient Prescriptions Prior to Visit  Medication Sig Dispense Refill  . amLODipine (NORVASC) 5 MG tablet Take 5 mg by mouth daily.        Marland Kitchen omeprazole (PRILOSEC) 20 MG capsule Take 20 mg by mouth daily.        . primidone (MYSOLINE) 250 MG tablet ONE HALF TABLET DAILY  50 tablet  0  . QUEtiapine (SEROQUEL) 100 MG tablet Take 150 mg by mouth at bedtime.        . valACYclovir (VALTREX) 1000 MG tablet Take 1,000 mg by mouth 2 (two) times daily.        . clopidogrel (PLAVIX) 75 MG tablet Take 75 mg by mouth daily.        . busPIRone (BUSPAR) 15 MG tablet Take 15 mg by mouth 3 (three) times daily.        Marland Kitchen  etonogestrel-ethinyl estradiol (NUVARING) 0.12-0.015 MG/24HR vaginal ring Place 1 each vaginally every 28 (twenty-eight) days. Insert vaginally and leave in place for 3 consecutive weeks, then remove for 1 week.       . hydrOXYzine (ATARAX/VISTARIL) 25 MG tablet Take 25 mg by mouth 3 (three) times daily as needed.        Marland Kitchen PARoxetine (PAXIL) 40 MG tablet Take 60 mg by mouth every morning.         No facility-administered medications prior to visit.    PAST MEDICAL HISTORY: Past Medical History  Diagnosis Date  . Stroke   . Depression   . Hypertension   . Herpes     PAST SURGICAL HISTORY: Past Surgical History  Procedure Laterality Date  . Spinal fusion    .  Vasectomy      FAMILY HISTORY: No family history on file.  SOCIAL HISTORY: History   Social History  . Marital Status: Married    Spouse Name: N/A    Number of Children: N/A  . Years of Education: N/A   Occupational History  . retired    Social History Main Topics  . Smoking status: Never Smoker   . Smokeless tobacco: Not on file  . Alcohol Use: No  . Drug Use: No  . Sexual Activity:    Other Topics Concern  . Not on file   Social History Narrative  . No narrative on file     PHYSICAL EXAM  Filed Vitals:   09/21/12 1532  BP: 145/97  Pulse: 67  Temp: 98.1 F (36.7 C)  TempSrc: Oral  Height: 5\' 6"  (1.676 m)  Weight: 155 lb (70.308 kg)   Body mass index is 25.03 kg/(m^2).  Generalized: Well developed, in no acute distress  Head: normocephalic and atraumatic,. Oropharynx benign  Neck: Supple, no carotid bruits  Cardiac: Regular rate rhythm, no murmur  Musculoskeletal: No deformity   Neurological examination  Mentation: Alert oriented to time, place, history taking. Follows all commands speech and language fluent Cranial nerve II-XII: Fundoscopic exam reveals sharp disc margins.Pupils were equal round reactive to light extraocular movements were full, visual field were full on confrontational test. Facial sensation and strength were normal. hearing was intact to finger rubbing bilaterally. Uvula tongue midline. head turning and shoulder shrug and were normal and symmetric.Tongue protrusion into cheek strength was normal. Motor: normal bulk and tone, full strength in the BUE, BLE, fine finger movements normal, no pronator drift. No focal weakness Sensory: normal and symmetric to light touch, pinprick, and  vibration  Coordination: finger-nose-finger, heel-to-shin bilaterally, no dysmetria Reflexes: Brachioradialis 2/2, biceps 2/2, triceps 2/2, patellar 2/2, Achilles 2/2, plantar responses were flexor bilaterally. Gait and Station: Rising up from seated position  without assistance, normal stance, without trunk ataxia, moderate stride, good arm swing, smooth turning, able to perform tiptoe, and heel walking without difficulty.   DIAGNOSTIC DATA (LABS, IMAGING, TESTING) - I reviewed patient records, labs, notes, testing and imaging myself where available.   ASSESSMENT AND PLAN 55 year African American male with upper extremity fine action tremors likely anxiety related. Benign essential tremor is possible though less likely. History of right brain subcortical infarct due to small vessel disease in December 2011 with no significant residual deficits. History of peripheral neuropathy diagnosis but no real clinical symptoms or signs on exam or on nerve conduction/EMG exam. Vascular risk factors of hypertension and hyperlipidemia.    PLAN: Taper Inderal LA 60 mg  alternate daily x 1 week then every  third day x 1 week and stop.  Continue primidone 125 mg daily.  Continue Plavix for secondary stroke prevention and maintain strict control of hypertension with blood pressure goal below 130/90   and lipids with LDL cholesterol goal below 100 mg percent.  We will check Carotid Dopplers. Return for followup in  6 months or call earlier if necessary.   Orders Placed This Encounter  Procedures  . US Carotid Duplex Bilateral    Standing Status: Future     Number of Occurrences:      Standing Expiration Date: 11/21/2013    Order Specific Question:  Reason for Exam (SYMPTOM  OR DIAGNOSIS REQUIRED)    Answer:  stroke    Order Specific Question:  Preferred imaging location?    Answer:  Internal   Tawny Asal LAM, MSN, NP-C 03:30 pm 09/21/2012  [facility] [address] [place], [place] [address] [phone]

## 2012-09-21 NOTE — Patient Instructions (Signed)
Continue clopidogrel 75 mg orally every day  for secondary stroke prevention and maintain strict control of hypertension with blood pressure goal below 130/90, diabetes with hemoglobin A1c goal below 6.5% and lipids with LDL cholesterol goal below 100 mg/dL.   Please get cholesterol checked with your primary doctor.  Followup in the future with me in 6 months.

## 2012-10-03 DIAGNOSIS — F331 Major depressive disorder, recurrent, moderate: Secondary | ICD-10-CM | POA: Diagnosis not present

## 2012-10-04 ENCOUNTER — Ambulatory Visit (INDEPENDENT_AMBULATORY_CARE_PROVIDER_SITE_OTHER): Payer: BC Managed Care – PPO

## 2012-10-04 DIAGNOSIS — I6350 Cerebral infarction due to unspecified occlusion or stenosis of unspecified cerebral artery: Secondary | ICD-10-CM | POA: Diagnosis not present

## 2012-10-09 ENCOUNTER — Telehealth: Payer: Self-pay | Admitting: Nurse Practitioner

## 2012-10-09 NOTE — Telephone Encounter (Signed)
Spoke to [name] by phone, letting him know that his carotid doppler study was normal.  He acknowledged results and had no questions.

## 2012-10-18 ENCOUNTER — Other Ambulatory Visit (HOSPITAL_COMMUNITY): Payer: Self-pay | Admitting: Neurology

## 2012-10-18 DIAGNOSIS — F331 Major depressive disorder, recurrent, moderate: Secondary | ICD-10-CM | POA: Diagnosis not present

## 2012-10-22 DIAGNOSIS — F331 Major depressive disorder, recurrent, moderate: Secondary | ICD-10-CM | POA: Diagnosis not present

## 2012-11-12 DIAGNOSIS — F331 Major depressive disorder, recurrent, moderate: Secondary | ICD-10-CM | POA: Diagnosis not present

## 2012-11-26 DIAGNOSIS — F331 Major depressive disorder, recurrent, moderate: Secondary | ICD-10-CM | POA: Diagnosis not present

## 2012-12-04 DIAGNOSIS — A6000 Herpesviral infection of urogenital system, unspecified: Secondary | ICD-10-CM | POA: Diagnosis not present

## 2012-12-04 DIAGNOSIS — N139 Obstructive and reflux uropathy, unspecified: Secondary | ICD-10-CM | POA: Diagnosis not present

## 2012-12-04 DIAGNOSIS — F529 Unspecified sexual dysfunction not due to a substance or known physiological condition: Secondary | ICD-10-CM | POA: Diagnosis not present

## 2012-12-04 DIAGNOSIS — N401 Enlarged prostate with lower urinary tract symptoms: Secondary | ICD-10-CM | POA: Diagnosis not present

## 2012-12-06 DIAGNOSIS — F331 Major depressive disorder, recurrent, moderate: Secondary | ICD-10-CM | POA: Diagnosis not present

## 2012-12-10 DIAGNOSIS — F331 Major depressive disorder, recurrent, moderate: Secondary | ICD-10-CM | POA: Diagnosis not present

## 2012-12-15 ENCOUNTER — Other Ambulatory Visit (HOSPITAL_COMMUNITY): Payer: Self-pay | Admitting: Neurology

## 2012-12-24 DIAGNOSIS — F331 Major depressive disorder, recurrent, moderate: Secondary | ICD-10-CM | POA: Diagnosis not present

## 2013-01-28 DIAGNOSIS — F331 Major depressive disorder, recurrent, moderate: Secondary | ICD-10-CM | POA: Diagnosis not present

## 2013-02-04 DIAGNOSIS — F331 Major depressive disorder, recurrent, moderate: Secondary | ICD-10-CM | POA: Diagnosis not present

## 2013-02-07 ENCOUNTER — Other Ambulatory Visit: Payer: Self-pay

## 2013-02-07 MED ORDER — PRIMIDONE 250 MG PO TABS: 125.0000 mg | ORAL_TABLET | Freq: Every day | ORAL | Status: DC

## 2013-02-11 DIAGNOSIS — F331 Major depressive disorder, recurrent, moderate: Secondary | ICD-10-CM | POA: Diagnosis not present

## 2013-02-25 DIAGNOSIS — F331 Major depressive disorder, recurrent, moderate: Secondary | ICD-10-CM | POA: Diagnosis not present

## 2013-03-11 DIAGNOSIS — F331 Major depressive disorder, recurrent, moderate: Secondary | ICD-10-CM | POA: Diagnosis not present

## 2013-03-20 DIAGNOSIS — E785 Hyperlipidemia, unspecified: Secondary | ICD-10-CM | POA: Diagnosis not present

## 2013-03-20 DIAGNOSIS — K219 Gastro-esophageal reflux disease without esophagitis: Secondary | ICD-10-CM | POA: Diagnosis not present

## 2013-03-20 DIAGNOSIS — I10 Essential (primary) hypertension: Secondary | ICD-10-CM | POA: Diagnosis not present

## 2013-03-25 ENCOUNTER — Ambulatory Visit: Payer: Medicare Other | Admitting: Nurse Practitioner

## 2013-03-25 DIAGNOSIS — F331 Major depressive disorder, recurrent, moderate: Secondary | ICD-10-CM | POA: Diagnosis not present

## 2013-04-04 ENCOUNTER — Encounter (INDEPENDENT_AMBULATORY_CARE_PROVIDER_SITE_OTHER): Payer: Self-pay

## 2013-04-04 ENCOUNTER — Ambulatory Visit (INDEPENDENT_AMBULATORY_CARE_PROVIDER_SITE_OTHER): Payer: Medicare Other | Admitting: Nurse Practitioner

## 2013-04-04 ENCOUNTER — Encounter: Payer: Self-pay | Admitting: Nurse Practitioner

## 2013-04-04 VITALS — BP 132/80 | HR 76 | Ht 65.5 in | Wt 182.0 lb

## 2013-04-04 DIAGNOSIS — G250 Essential tremor: Secondary | ICD-10-CM | POA: Diagnosis not present

## 2013-04-04 DIAGNOSIS — R251 Tremor, unspecified: Secondary | ICD-10-CM

## 2013-04-04 DIAGNOSIS — R259 Unspecified abnormal involuntary movements: Secondary | ICD-10-CM | POA: Diagnosis not present

## 2013-04-04 DIAGNOSIS — Z8673 Personal history of transient ischemic attack (TIA), and cerebral infarction without residual deficits: Secondary | ICD-10-CM | POA: Diagnosis not present

## 2013-04-04 DIAGNOSIS — G252 Other specified forms of tremor: Secondary | ICD-10-CM | POA: Diagnosis not present

## 2013-04-04 MED ORDER — PRIMIDONE 250 MG PO TABS: 125.0000 mg | ORAL_TABLET | Freq: Every day | ORAL | Status: DC

## 2013-04-04 MED ORDER — CLOPIDOGREL BISULFATE 75 MG PO TABS: 75.0000 mg | ORAL_TABLET | Freq: Every day | ORAL | Status: DC

## 2013-04-04 NOTE — Patient Instructions (Signed)
Continue primidone 125 mg daily for tremor. Continue Plavix for secondary stroke prevention and maintain strict control of hypertension with blood pressure goal below 130/90 and lipids with LDL cholesterol goal below 100 mg percent.  Return for followup in 6 months or call earlier if necessary.

## 2013-04-04 NOTE — Progress Notes (Signed)
PATIENT: [name] DOB: [birth year]  REASON FOR VISIT: follow up for essential tremor, history of stroke HISTORY FROM: patient  HISTORY OF PRESENT ILLNESS: 55 year African American male with upper extremity fine action tremors likely anxiety related. Benign essential tremor is possible though less likely. History of right brain subcortical infarct due to small vessel disease in December 2011 with no significant residual deficits. History of peripheral neuropathy diagnosis but no real clinical symptoms or signs on exam or on nerve conduction/EMG exam.. Vascular risk factors of hypertension and hyperlipidemia. New 50 llbs weightloss in 1 year without determined cause.  He returns for followup after his last visit on 12/08/11. He states his noticed some improvement in his tremors after starting the primidone which he takes 100 mg daily. He seems to be tolerating it well without any drowsiness, dryness of mouth or other side effects. He states payment on is helping him more than intra-and is wondering if he can come off the Inderal. He has no new complaints. He had lab work done on 12/08/11 which showed normal thyroid function tests and LFTs. He's had no new stroke or TIA symptoms. He remains on Plavix and is tolerating it well without side effects. He states his blood pressure is under good control and it is 131/80 today.   UPDATE 09/21/12 (LL): [name] returns for follow up today, last visit was on 03/21/12. He was increased on Primidone 125 mg last visit for tremor and was supposed to taper off Propranolol, but he states that he was not aware that he was to stop it. He states his tremor is about the same. He is going through a tough time personally, he is separated from his wife. He states that he was very depressed but is doing much better now. He continues to take daily Plavix with no excessive bleeding or bruising. HE states his blood pressure is usually well controlled but it is 145/97 in  office. He states he is to have his labs checked at his PCP in 6 weeks. He has no new complaints.   UPDATE 04/04/13 (LL):  [name] returns for follow up today, last visit was in September 2014.  He continues to take Primidone 125 mg daily for tremor.   He continues to take daily Plavix with no excessive bleeding or bruising. His last carotid doppler study in September was normal. BP today in office is 132/80, is better controlled with increase in BP medication.   He has notably gained almost 30 pounds since last visit, he explains he does not know why, not eating differently.  He has had changes though with his psychiatric meds though.  REVIEW OF SYSTEMS: Full 14 system review of systems performed and notable only for:   ALLERGIES: Allergies  Allergen Reactions  . Aspirin   . Lisinopril     critical  . Lithium     critical  . Oxycodone     HOME MEDICATIONS: Outpatient Prescriptions Prior to Visit  Medication Sig Dispense Refill  . amLODipine (NORVASC) 5 MG tablet Take 5 mg by mouth daily.        . diazepam (VALIUM) 5 MG tablet Take 5 mg by mouth every 6 (six) hours as needed for anxiety. Take one tab 4-5 times a day.      Marland Kitchen LOSARTAN POTASSIUM PO Take 1 tablet by mouth daily.       Marland Kitchen omeprazole (PRILOSEC) 20 MG capsule Take 20 mg by mouth daily.        Marland Kitchen  TraZODone HCl 300 MG TB24 Take by mouth once.      . valACYclovir (VALTREX) 1000 MG tablet Take 1,000 mg by mouth 2 (two) times daily.        . clopidogrel (PLAVIX) 75 MG tablet Take 1 tablet (75 mg total) by mouth daily.  90 tablet  3  . primidone (MYSOLINE) 250 MG tablet Take 0.5 tablets (125 mg total) by mouth daily.  50 tablet  0  . potassium chloride (KLOR-CON M10) 10 MEQ tablet Take 10 mEq by mouth once.      Marland Kitchen QUEtiapine (SEROQUEL) 100 MG tablet Take 150 mg by mouth at bedtime.        . Vortioxetine HBr (BRINTELLIX) 20 MG TABS Take by mouth daily.       No facility-administered medications prior to visit.     PHYSICAL  EXAM  Filed Vitals:   04/04/13 1456  BP: 132/80  Pulse: 76  Height: 5' 5.5" (1.664 m)  Weight: 182 lb (82.555 kg)   Body mass index is 29.82 kg/(m^2).  Generalized: Well developed, in no acute distress  Head: normocephalic and atraumatic, Oropharynx benign  Neck: Supple, no carotid bruits  Cardiac: Regular rate rhythm, no murmur  Musculoskeletal: No deformity   Neurological examination  Mentation: Alert oriented to time, place, history taking. Follows all commands speech and language fluent  Cranial nerve II-XII: Pupils were equal round reactive to light extraocular movements were full, visual field were full on confrontational test. Facial sensation and strength were normal. hearing was intact to finger rubbing bilaterally. Uvula tongue midline. head turning and shoulder shrug and were normal and symmetric.Tongue protrusion into cheek strength was normal.  Motor: normal bulk and tone, full strength in the BUE, BLE, fine finger movements normal, no pronator drift. No focal weakness. No tremor noted. Sensory: normal and symmetric to light touch, pinprick, and vibration  Coordination: finger-nose-finger, heel-to-shin bilaterally, no dysmetria  Gait and Station: Rising up from seated position without assistance, normal stance, without trunk ataxia, moderate stride, good arm swing, smooth turning, able to perform tiptoe, and heel walking without difficulty.  Reflexes: equal and symmetric  ASSESSMENT AND PLAN 58 year African American male with upper extremity fine action tremors likely anxiety related. Benign essential tremor is possible though less likely. History of right brain subcortical infarct due to small vessel disease in December 2011 with no significant residual deficits. History of peripheral neuropathy diagnosis but no real clinical symptoms or signs on exam or on nerve conduction/EMG exam. Vascular risk factors of hypertension and hyperlipidemia.   PLAN:  Continue primidone 125  mg daily for tremor. Continue Plavix for secondary stroke prevention and maintain strict control of hypertension with blood pressure goal below 130/90 and lipids with LDL cholesterol goal below 100 mg percent.  Return for followup in 6 months or call earlier if necessary.  Philmore Pali, MSN, NP-C 04/04/2013, 3:15 PM [facility] [address], [place],  [address] [phone]  Note: This document was prepared with digital dictation and possible smart phrase technology. Any transcriptional errors that result from this process are unintentional.

## 2013-04-05 ENCOUNTER — Ambulatory Visit: Payer: Medicare Other | Admitting: Nurse Practitioner

## 2013-04-08 DIAGNOSIS — F331 Major depressive disorder, recurrent, moderate: Secondary | ICD-10-CM | POA: Diagnosis not present

## 2013-04-10 DIAGNOSIS — F331 Major depressive disorder, recurrent, moderate: Secondary | ICD-10-CM | POA: Diagnosis not present

## 2013-04-24 DIAGNOSIS — R634 Abnormal weight loss: Secondary | ICD-10-CM | POA: Diagnosis not present

## 2013-04-24 DIAGNOSIS — I10 Essential (primary) hypertension: Secondary | ICD-10-CM | POA: Diagnosis not present

## 2013-04-24 DIAGNOSIS — L74519 Primary focal hyperhidrosis, unspecified: Secondary | ICD-10-CM | POA: Diagnosis not present

## 2013-04-24 DIAGNOSIS — E7800 Pure hypercholesterolemia, unspecified: Secondary | ICD-10-CM | POA: Diagnosis not present

## 2013-04-25 DIAGNOSIS — F331 Major depressive disorder, recurrent, moderate: Secondary | ICD-10-CM | POA: Diagnosis not present

## 2013-05-16 DIAGNOSIS — F331 Major depressive disorder, recurrent, moderate: Secondary | ICD-10-CM | POA: Diagnosis not present

## 2013-05-29 DIAGNOSIS — F331 Major depressive disorder, recurrent, moderate: Secondary | ICD-10-CM | POA: Diagnosis not present

## 2013-06-03 DIAGNOSIS — E7800 Pure hypercholesterolemia, unspecified: Secondary | ICD-10-CM | POA: Diagnosis not present

## 2013-06-03 DIAGNOSIS — I10 Essential (primary) hypertension: Secondary | ICD-10-CM | POA: Diagnosis not present

## 2013-06-03 DIAGNOSIS — L74519 Primary focal hyperhidrosis, unspecified: Secondary | ICD-10-CM | POA: Diagnosis not present

## 2013-06-04 DIAGNOSIS — A6000 Herpesviral infection of urogenital system, unspecified: Secondary | ICD-10-CM | POA: Diagnosis not present

## 2013-06-04 DIAGNOSIS — F529 Unspecified sexual dysfunction not due to a substance or known physiological condition: Secondary | ICD-10-CM | POA: Diagnosis not present

## 2013-06-12 DIAGNOSIS — F331 Major depressive disorder, recurrent, moderate: Secondary | ICD-10-CM | POA: Diagnosis not present

## 2013-06-26 DIAGNOSIS — F331 Major depressive disorder, recurrent, moderate: Secondary | ICD-10-CM | POA: Diagnosis not present

## 2013-07-08 DIAGNOSIS — F331 Major depressive disorder, recurrent, moderate: Secondary | ICD-10-CM | POA: Diagnosis not present

## 2013-07-10 DIAGNOSIS — F331 Major depressive disorder, recurrent, moderate: Secondary | ICD-10-CM | POA: Diagnosis not present

## 2013-07-11 ENCOUNTER — Telehealth: Payer: Self-pay | Admitting: Nurse Practitioner

## 2013-07-11 NOTE — Telephone Encounter (Signed)
A one year Rx for this drug was sent to [facility] at [place] on 03/19.  I called the pharmacy, spoke with Gus, he said there are plenty of refills remaining.  I called the patient back.  He is aware and will follow up with the pharmacy.

## 2013-07-11 NOTE — Telephone Encounter (Signed)
Pt called to request his medication primidone (MYSOLINE) 250 MG tablet, [facility] [place]. Please call pt when this has been called in. Thanks

## 2013-07-22 DIAGNOSIS — K5900 Constipation, unspecified: Secondary | ICD-10-CM | POA: Diagnosis not present

## 2013-07-22 DIAGNOSIS — R109 Unspecified abdominal pain: Secondary | ICD-10-CM | POA: Diagnosis not present

## 2013-07-22 DIAGNOSIS — R635 Abnormal weight gain: Secondary | ICD-10-CM | POA: Diagnosis not present

## 2013-08-15 DIAGNOSIS — K921 Melena: Secondary | ICD-10-CM | POA: Diagnosis not present

## 2013-08-15 DIAGNOSIS — R198 Other specified symptoms and signs involving the digestive system and abdomen: Secondary | ICD-10-CM | POA: Diagnosis not present

## 2013-08-15 DIAGNOSIS — R197 Diarrhea, unspecified: Secondary | ICD-10-CM | POA: Diagnosis not present

## 2013-08-21 DIAGNOSIS — R197 Diarrhea, unspecified: Secondary | ICD-10-CM | POA: Diagnosis not present

## 2013-08-21 DIAGNOSIS — D126 Benign neoplasm of colon, unspecified: Secondary | ICD-10-CM | POA: Diagnosis not present

## 2013-08-21 DIAGNOSIS — K648 Other hemorrhoids: Secondary | ICD-10-CM | POA: Diagnosis not present

## 2013-08-21 DIAGNOSIS — R195 Other fecal abnormalities: Secondary | ICD-10-CM | POA: Diagnosis not present

## 2013-08-29 DIAGNOSIS — F331 Major depressive disorder, recurrent, moderate: Secondary | ICD-10-CM | POA: Diagnosis not present

## 2013-09-13 DIAGNOSIS — I10 Essential (primary) hypertension: Secondary | ICD-10-CM | POA: Diagnosis not present

## 2013-09-13 DIAGNOSIS — Z23 Encounter for immunization: Secondary | ICD-10-CM | POA: Diagnosis not present

## 2013-09-13 DIAGNOSIS — K219 Gastro-esophageal reflux disease without esophagitis: Secondary | ICD-10-CM | POA: Diagnosis not present

## 2013-09-13 DIAGNOSIS — R197 Diarrhea, unspecified: Secondary | ICD-10-CM | POA: Diagnosis not present

## 2013-09-13 DIAGNOSIS — E785 Hyperlipidemia, unspecified: Secondary | ICD-10-CM | POA: Diagnosis not present

## 2013-09-16 DIAGNOSIS — F331 Major depressive disorder, recurrent, moderate: Secondary | ICD-10-CM | POA: Diagnosis not present

## 2013-09-19 DIAGNOSIS — F332 Major depressive disorder, recurrent severe without psychotic features: Secondary | ICD-10-CM | POA: Diagnosis not present

## 2013-09-26 DIAGNOSIS — F331 Major depressive disorder, recurrent, moderate: Secondary | ICD-10-CM | POA: Diagnosis not present

## 2013-09-30 DIAGNOSIS — L74519 Primary focal hyperhidrosis, unspecified: Secondary | ICD-10-CM | POA: Diagnosis not present

## 2013-09-30 DIAGNOSIS — I10 Essential (primary) hypertension: Secondary | ICD-10-CM | POA: Diagnosis not present

## 2013-09-30 DIAGNOSIS — E7800 Pure hypercholesterolemia, unspecified: Secondary | ICD-10-CM | POA: Diagnosis not present

## 2013-10-08 ENCOUNTER — Ambulatory Visit: Payer: Medicare Other | Admitting: Neurology

## 2013-10-09 ENCOUNTER — Telehealth: Payer: Self-pay | Admitting: Nurse Practitioner

## 2013-10-09 ENCOUNTER — Ambulatory Visit: Payer: Medicare Other | Admitting: Neurology

## 2013-10-09 DIAGNOSIS — R413 Other amnesia: Secondary | ICD-10-CM | POA: Diagnosis not present

## 2013-10-09 DIAGNOSIS — Z1389 Encounter for screening for other disorder: Secondary | ICD-10-CM | POA: Diagnosis not present

## 2013-10-09 DIAGNOSIS — I10 Essential (primary) hypertension: Secondary | ICD-10-CM | POA: Diagnosis not present

## 2013-10-09 NOTE — Telephone Encounter (Signed)
He should not increase his dose.  IF symptoms are not controlled on current dose, he should come in for follow up visit with MD.

## 2013-10-09 NOTE — Telephone Encounter (Signed)
Patient stated PCP, Dr. Jacqlyn Larsen informed him he should take 2 tabs a day of primidone (MYSOLINE) 250 MG tablet. The Rx Jeani Hawking prescribed instructed pt to take 0.5 tabs daily.  Please call and advise any time. May leave detailed message on voicemail.

## 2013-10-09 NOTE — Telephone Encounter (Signed)
Please advised previous note. Thanks

## 2013-10-10 ENCOUNTER — Other Ambulatory Visit: Payer: Self-pay | Admitting: Hematology & Oncology

## 2013-10-10 ENCOUNTER — Other Ambulatory Visit (HOSPITAL_BASED_OUTPATIENT_CLINIC_OR_DEPARTMENT_OTHER): Payer: Self-pay | Admitting: Family Medicine

## 2013-10-10 DIAGNOSIS — R413 Other amnesia: Secondary | ICD-10-CM

## 2013-10-10 NOTE — Telephone Encounter (Signed)
Called pt to inform her per Jeani Hawking, NP that the pt should not increase his dose and if symptoms are not controlled on current dose, he should com in for f/u with MD. Pt has an appt with Dr. [name] on 10/16/13 and pt wanting Dr. [name] to speak with him concerning this medication and why he is on this. I advised the pt that if he has any other problems, questions or concerns to call the office. Pt verbalized understanding.  FYI

## 2013-10-14 DIAGNOSIS — F331 Major depressive disorder, recurrent, moderate: Secondary | ICD-10-CM | POA: Diagnosis not present

## 2013-10-16 ENCOUNTER — Ambulatory Visit (HOSPITAL_BASED_OUTPATIENT_CLINIC_OR_DEPARTMENT_OTHER)
Admission: RE | Admit: 2013-10-16 | Discharge: 2013-10-16 | Disposition: A | Discharge status: Home / Self Care | Payer: Medicare Other | Source: Ambulatory Visit | Attending: Family Medicine | Admitting: Family Medicine

## 2013-10-16 ENCOUNTER — Ambulatory Visit (INDEPENDENT_AMBULATORY_CARE_PROVIDER_SITE_OTHER): Payer: Medicare Other | Admitting: Neurology

## 2013-10-16 ENCOUNTER — Encounter: Payer: Self-pay | Admitting: Neurology

## 2013-10-16 VITALS — BP 127/87 | HR 89 | Wt 183.0 lb

## 2013-10-16 DIAGNOSIS — Z8673 Personal history of transient ischemic attack (TIA), and cerebral infarction without residual deficits: Secondary | ICD-10-CM | POA: Insufficient documentation

## 2013-10-16 DIAGNOSIS — I679 Cerebrovascular disease, unspecified: Secondary | ICD-10-CM | POA: Insufficient documentation

## 2013-10-16 DIAGNOSIS — F3289 Other specified depressive episodes: Secondary | ICD-10-CM

## 2013-10-16 DIAGNOSIS — F32A Depression, unspecified: Secondary | ICD-10-CM | POA: Insufficient documentation

## 2013-10-16 DIAGNOSIS — R413 Other amnesia: Secondary | ICD-10-CM | POA: Insufficient documentation

## 2013-10-16 DIAGNOSIS — F329 Major depressive disorder, single episode, unspecified: Secondary | ICD-10-CM

## 2013-10-16 MED ORDER — GADOBENATE DIMEGLUMINE 529 MG/ML IV SOLN
17.0000 mL | Freq: Once | INTRAVENOUS | Status: AC | PRN
  Administered 2013-10-16: 17 mL via INTRAVENOUS

## 2013-10-16 NOTE — Progress Notes (Signed)
PATIENT: [name] DOB: 02-05-1950  REASON FOR VISIT: follow up for essential tremor, history of stroke HISTORY FROM: patient  HISTORY OF PRESENT ILLNESS: 69 year African American male with upper extremity fine action tremors likely anxiety related. Benign essential tremor is possible though less likely. History of right brain subcortical infarct due to small vessel disease in December 2011 with no significant residual deficits. History of peripheral neuropathy diagnosis but no real clinical symptoms or signs on exam or on nerve conduction/EMG exam.. Vascular risk factors of hypertension and hyperlipidemia. New 50 llbs weightloss in 1 year without determined cause.  He returns for followup after his last visit on 12/08/11. He states his noticed some improvement in his tremors after starting the primidone which he takes 100 mg daily. He seems to be tolerating it well without any drowsiness, dryness of mouth or other side effects. He states payment on is helping him more than intra-and is wondering if he can come off the Inderal. He has no new complaints. He had lab work done on 12/08/11 which showed normal thyroid function tests and LFTs. He's had no new stroke or TIA symptoms. He remains on Plavix and is tolerating it well without side effects. He states his blood pressure is under good control and it is 131/80 today.   UPDATE 09/21/12 (LL): [name] returns for follow up today, last visit was on 03/21/12. He was increased on Primidone 125 mg last visit for tremor and was supposed to taper off Propranolol, but he states that he was not aware that he was to stop it. He states his tremor is about the same. He is going through a tough time personally, he is separated from his wife. He states that he was very depressed but is doing much better now. He continues to take daily Plavix with no excessive bleeding or bruising. HE states his blood pressure is usually well controlled but it is 145/97 in  office. He states he is to have his labs checked at his PCP in 6 weeks. He has no new complaints.   UPDATE 04/04/13 (LL):  [name] returns for follow up today, last visit was in September 2014.  He continues to take Primidone 125 mg daily for tremor.   He continues to take daily Plavix with no excessive bleeding or bruising. His last carotid doppler study in September was normal. BP today in office is 132/80, is better controlled with increase in BP medication.   He has notably gained almost 30 pounds since last visit, he explains he does not know why, not eating differently.  He has had changes though with his psychiatric meds though. Update 10/16/2013 : He returns for followup after his last visit 6 months ago. He continues to have mild tremors in his hands and is tolerating premature 125 mg at night fairly well without significant side effects. He states the tremors do not affect his routine or date and activities. She has been complaining of some paresthesias and discomfort involving his head mainly on the left side for 3-4 weeks this is present mainly in the morning but does improve as the day goes along. He denies headache or discomfort. Denies any sharp shooting pain or any vision loss. He has been complaining of some progressive memory difficulties. He does get lost easily and has to rely on his GPS. He forgets appointments and even really writes things down. He does have long-standing depression and does take medications for that.  REVIEW OF  SYSTEMS: Full 14 system review of systems performed and notable only for: Activity and appetite change, fatigue, chills, drooling, constipation, diarrhea, cold intolerance, swollen abdomen, and frequent waking, daytime sleepiness, testicular pain frequent urination, speech difficulty, tremors, decreased concentration and depression.  ALLERGIES: Allergies  Allergen Reactions  . Aspirin   . Lisinopril     critical  . Lithium     critical  . Oxycodone      HOME MEDICATIONS: Outpatient Prescriptions Prior to Visit  Medication Sig Dispense Refill  . amLODipine (NORVASC) 10 MG tablet Take 10 mg by mouth daily.      . ARIPiprazole (ABILIFY) 10 MG tablet Take 10 mg by mouth at bedtime.      . clopidogrel (PLAVIX) 75 MG tablet Take 1 tablet (75 mg total) by mouth daily.  90 tablet  3  . LOSARTAN POTASSIUM PO Take 50 mg by mouth daily.       Marland Kitchen lovastatin (MEVACOR) 10 MG tablet Take 10 mg by mouth at bedtime.      . modafinil (PROVIGIL) 200 MG tablet Take 200 mg by mouth. Taking 1 1/2 tablet      . Multiple Vitamin (MULTI VITAMIN DAILY PO) Take by mouth.      Marland Kitchen omeprazole (PRILOSEC) 20 MG capsule Take 20 mg by mouth daily.        Marland Kitchen oxybutynin (DITROPAN-XL) 10 MG 24 hr tablet Take 10 mg by mouth at bedtime.      Marland Kitchen PARoxetine (PAXIL) 30 MG tablet Take 30 mg by mouth daily.      . primidone (MYSOLINE) 250 MG tablet Take 0.5 tablets (125 mg total) by mouth daily.  90 tablet  3  . tadalafil (CIALIS) 5 MG tablet Take 5 mg by mouth daily as needed for erectile dysfunction.      . TraZODone HCl 300 MG TB24 Take by mouth once.      . valACYclovir (VALTREX) 1000 MG tablet Take 1,000 mg by mouth 2 (two) times daily.       . Dietary Management Product (ENLYTE) CAPS Take 1 capsule by mouth daily.      . ondansetron (ZOFRAN-ODT) 4 MG disintegrating tablet Take 4 mg by mouth every 8 (eight) hours as needed for nausea or vomiting.       No facility-administered medications prior to visit.     PHYSICAL EXAM  Filed Vitals:   10/16/13 1054  BP: 127/87  Pulse: 89  Weight: 183 lb (83.008 kg)   Body mass index is 29.98 kg/(m^2).  Generalized: Well developed, in no acute distress  Head: normocephalic and atraumatic, Oropharynx benign  Neck: Supple, no carotid bruits  Cardiac: Regular rate rhythm, no murmur  Musculoskeletal: No deformity   Neurological examination  Mentation: Alert oriented to time, place, history taking. Follows all commands speech and  language fluent . Mini-Mental status exam scored 28/30 with 2 deficits in recall only. And naming test 6. Clock drawing 4/4. Geriatric depression scale suggest moderate depression Cranial nerve II-XII: Pupils were equal round reactive to light extraocular movements were full, visual field were full on confrontational test. Facial sensation and strength were normal. hearing was intact to finger rubbing bilaterally. Uvula tongue midline. head turning and shoulder shrug and were normal and symmetric.Tongue protrusion into cheek strength was normal.  Motor: normal bulk and tone, full strength in the BUE, BLE, fine finger movements normal, no pronator drift. No focal weakness. No tremor noted. Sensory: normal and symmetric to light touch, pinprick, and vibration  Coordination:  finger-nose-finger, heel-to-shin bilaterally, no dysmetria  Gait and Station: Rising up from seated position without assistance, normal stance, without trunk ataxia, moderate stride, good arm swing, smooth turning, able to perform tiptoe, and heel walking without difficulty.  Reflexes: equal and symmetric  ASSESSMENT AND PLAN 89 year African American male with upper extremity fine action tremors likely anxiety related. Benign essential tremor is possible though less likely. History of right brain subcortical infarct due to small vessel disease in December 2011 with no significant residual deficits. History of peripheral neuropathy diagnosis but no real clinical symptoms or signs on exam or on nerve conduction/EMG exam. Vascular risk factors of hypertension and hyperlipidemia. New complaints of mild memory difficulties likely due to underlying depression  PLAN:  I had a long discussion the patient in regards to his mild hand tremors which seem fairly stable on the current low dose of primidone 125 mg at night. He is having some gradual to memory difficulties and we will evaluate him for treatable causes of memory loss. Continue Paxil and  Aripripazole for his depression and followup with her psychiatrist Continue Plavix for stroke prevention and strict control of hypertension with blood pressure goal below 130/90 and lipids with LDL cholesterol goal below 130 mg percent. Check followup carotid ultrasound study. Return for followup in 2 months or call earlier if necessary.   Antony Contras, MD  10/16/2013, 12:21 PM [facility] [address] [place], [place] [address] [phone]  Note: This document was prepared with digital dictation and possible smart phrase technology. Any transcriptional errors that result from this process are unintentional.

## 2013-10-16 NOTE — Patient Instructions (Signed)
I had a long discussion the patient in regards to his mild hand tremors which seem fairly stable on the current low dose of primidone 125 mg at night. He is having some gradual to memory difficulties and we will evaluate him for treatable causes of memory loss. Continue Plavix for stroke prevention and strict control of hypertension with blood pressure goal below 130/90 and lipids with LDL cholesterol goal below 130 mg percent. Check followup carotid ultrasound study. Return for followup in 2 months or call earlier if necessary.

## 2013-10-17 LAB — HIV ANTIBODY (ROUTINE TESTING W REFLEX)
HIV 1/O/2 Abs-Index Value: <1 (ref <1.00)
HIV-1/HIV-2 Ab: NONREACTIVE

## 2013-10-17 LAB — TSH: TSH: 2.85 u[IU]/mL (ref 0.450–4.500)

## 2013-10-17 LAB — RPR: RPR: NONREACTIVE

## 2013-10-17 LAB — VITAMIN B12: Vitamin B-12: 886 pg/mL (ref 211–946)

## 2013-10-23 ENCOUNTER — Other Ambulatory Visit: Payer: Medicare Other | Admitting: Radiology

## 2013-10-30 ENCOUNTER — Ambulatory Visit (INDEPENDENT_AMBULATORY_CARE_PROVIDER_SITE_OTHER): Payer: Medicare Other

## 2013-10-30 DIAGNOSIS — I679 Cerebrovascular disease, unspecified: Secondary | ICD-10-CM | POA: Diagnosis not present

## 2013-10-30 DIAGNOSIS — R413 Other amnesia: Secondary | ICD-10-CM

## 2013-10-31 DIAGNOSIS — F332 Major depressive disorder, recurrent severe without psychotic features: Secondary | ICD-10-CM | POA: Diagnosis not present

## 2013-11-05 DIAGNOSIS — F332 Major depressive disorder, recurrent severe without psychotic features: Secondary | ICD-10-CM | POA: Diagnosis not present

## 2013-11-13 DIAGNOSIS — F332 Major depressive disorder, recurrent severe without psychotic features: Secondary | ICD-10-CM | POA: Diagnosis not present

## 2013-11-27 DIAGNOSIS — F332 Major depressive disorder, recurrent severe without psychotic features: Secondary | ICD-10-CM | POA: Diagnosis not present

## 2013-12-05 DIAGNOSIS — N529 Male erectile dysfunction, unspecified: Secondary | ICD-10-CM | POA: Diagnosis not present

## 2013-12-09 DIAGNOSIS — F332 Major depressive disorder, recurrent severe without psychotic features: Secondary | ICD-10-CM | POA: Diagnosis not present

## 2013-12-23 DIAGNOSIS — F332 Major depressive disorder, recurrent severe without psychotic features: Secondary | ICD-10-CM | POA: Diagnosis not present

## 2013-12-23 DIAGNOSIS — R61 Generalized hyperhidrosis: Secondary | ICD-10-CM | POA: Diagnosis not present

## 2013-12-25 DIAGNOSIS — F332 Major depressive disorder, recurrent severe without psychotic features: Secondary | ICD-10-CM | POA: Diagnosis not present

## 2014-01-27 DIAGNOSIS — F332 Major depressive disorder, recurrent severe without psychotic features: Secondary | ICD-10-CM | POA: Diagnosis not present

## 2014-02-18 ENCOUNTER — Ambulatory Visit (INDEPENDENT_AMBULATORY_CARE_PROVIDER_SITE_OTHER): Payer: Medicare Other | Admitting: Neurology

## 2014-02-18 ENCOUNTER — Encounter: Payer: Self-pay | Admitting: Neurology

## 2014-02-18 VITALS — BP 140/89 | HR 74 | Ht 65.5 in | Wt 189.4 lb

## 2014-02-18 DIAGNOSIS — R251 Tremor, unspecified: Secondary | ICD-10-CM | POA: Diagnosis not present

## 2014-02-18 MED ORDER — PRIMIDONE 250 MG PO TABS: 125.0000 mg | ORAL_TABLET | Freq: Two times a day (BID) | ORAL | Status: DC

## 2014-02-18 NOTE — Patient Instructions (Addendum)
I had a long discussion with the patient about his hand tremors and recommend increasing primidone to 125 mg twice daily if tolerated. I have discussed possible side effects with him and advised him to call me. I also personally reviewed his imaging studies and lab results with him and answered questions. Continue Plavix for stroke prevention and strict control of hypertension with blood pressure goal below 130/90, LDL goal below 100 mg percent. Continue follow-up with his psychiatrist for his depression. Return for follow-up in 3 months or call earlier if necessary.

## 2014-02-18 NOTE — Progress Notes (Signed)
PATIENT: [name] DOB: Mar 11, 1950  REASON FOR VISIT: follow up for essential tremor, history of stroke HISTORY FROM: patient  HISTORY OF PRESENT ILLNESS: 15 year African American male with upper extremity fine action tremors likely anxiety related. Benign essential tremor is possible though less likely. History of right brain subcortical infarct due to small vessel disease in December 2011 with no significant residual deficits. History of peripheral neuropathy diagnosis but no real clinical symptoms or signs on exam or on nerve conduction/EMG exam.. Vascular risk factors of hypertension and hyperlipidemia. New 50 llbs weightloss in 1 year without determined cause.  He returns for followup after his last visit on 12/08/11. He states his noticed some improvement in his tremors after starting the primidone which he takes 100 mg daily. He seems to be tolerating it well without any drowsiness, dryness of mouth or other side effects. He states payment on is helping him more than intra-and is wondering if he can come off the Inderal. He has no new complaints. He had lab work done on 12/08/11 which showed normal thyroid function tests and LFTs. He's had no new stroke or TIA symptoms. He remains on Plavix and is tolerating it well without side effects. He states his blood pressure is under good control and it is 131/80 today.   UPDATE 09/21/12 (LL): [name] returns for follow up today, last visit was on 03/21/12. He was increased on Primidone 125 mg last visit for tremor and was supposed to taper off Propranolol, but he states that he was not aware that he was to stop it. He states his tremor is about the same. He is going through a tough time personally, he is separated from his wife. He states that he was very depressed but is doing much better now. He continues to take daily Plavix with no excessive bleeding or bruising. HE states his blood pressure is usually well controlled but it is 145/97 in  office. He states he is to have his labs checked at his PCP in 6 weeks. He has no new complaints.   UPDATE 04/04/13 (LL):  [name] returns for follow up today, last visit was in September 2014.  He continues to take Primidone 125 mg daily for tremor.   He continues to take daily Plavix with no excessive bleeding or bruising. His last carotid doppler study in September was normal. BP today in office is 132/80, is better controlled with increase in BP medication.   He has notably gained almost 30 pounds since last visit, he explains he does not know why, not eating differently.  He has had changes though with his psychiatric meds though. Update 10/16/2013 : He returns for followup after his last visit 6 months ago. He continues to have mild tremors in his hands and is tolerating premature 125 mg at night fairly well without significant side effects. He states the tremors do not affect his routine or date and activities. She has been complaining of some paresthesias and discomfort involving his head mainly on the left side for 3-4 weeks this is present mainly in the morning but does improve as the day goes along. He denies headache or discomfort. Denies any sharp shooting pain or any vision loss. He has been complaining of some progressive memory difficulties. He does get lost easily and has to rely on his GPS. He forgets appointments and even really writes things down. He does have long-standing depression and does take medications for that. Update 02/18/14 :  He returns for follow-up after last visit 4 months ago. He reports slight worsening of his left hand tremor which at times now interferes with his activities of daily living during the day as well. He has not tried a higher dose of Mysoline be on 125 mg at bedtime yet but is willing to do so. He states his memory difficulties are unchanged. He remains compliant with his depression medications and sees a psychiatrist regularly. He had lab work done at last  visit on 10/16/13 which showed normal vitamin B12, TSH. RPR and HIV antibodies were negative. Carotid ultrasound done on 10/30/13 personally reviewed by me shows no significant extracranial stenosis. REVIEW OF SYSTEMS: Full 14 system review of systems performed and notable only for: Unexpected weight change, ear discharge, cold intolerance, frequent waking, sleep talking, bladder urgency, joint pain, muscle cramps, skin moles, depression, anxiety and nervousness  ALLERGIES: Allergies  Allergen Reactions  . Aspirin   . Lisinopril     critical  . Lithium     critical  . Oxycodone     HOME MEDICATIONS: Outpatient Prescriptions Prior to Visit  Medication Sig Dispense Refill  . amLODipine (NORVASC) 10 MG tablet Take 10 mg by mouth daily.    . ARIPiprazole (ABILIFY) 10 MG tablet Take 10 mg by mouth at bedtime.    . clopidogrel (PLAVIX) 75 MG tablet Take 1 tablet (75 mg total) by mouth daily. 90 tablet 3  . diazepam (VALIUM) 2 MG tablet Take 2 mg by mouth as needed.    Marland Kitchen LOSARTAN POTASSIUM PO Take 50 mg by mouth daily.     Marland Kitchen lovastatin (MEVACOR) 10 MG tablet Take 10 mg by mouth at bedtime.    . modafinil (PROVIGIL) 200 MG tablet Take 200 mg by mouth. Taking 1 1/2 tablet    . omeprazole (PRILOSEC) 20 MG capsule Take 20 mg by mouth daily.      Marland Kitchen oxybutynin (DITROPAN-XL) 10 MG 24 hr tablet Take 10 mg by mouth at bedtime.    Marland Kitchen PARoxetine (PAXIL) 30 MG tablet Take 30 mg by mouth daily.    . tadalafil (CIALIS) 5 MG tablet Take 5 mg by mouth daily as needed for erectile dysfunction.    . TraZODone HCl 300 MG TB24 Take by mouth once.    . valACYclovir (VALTREX) 1000 MG tablet Take 1,000 mg by mouth 2 (two) times daily.     . primidone (MYSOLINE) 250 MG tablet Take 0.5 tablets (125 mg total) by mouth daily. 90 tablet 3  . Multiple Vitamin (MULTI VITAMIN DAILY PO) Take by mouth.     No facility-administered medications prior to visit.     PHYSICAL EXAM  Filed Vitals:   02/18/14 1019  BP:  140/89  Pulse: 74  Height: 5' 5.5" (1.664 m)  Weight: 189 lb 6.4 oz (85.911 kg)   Body mass index is 31.03 kg/(m^2).  Generalized: Well developed, in no acute distress  Head: normocephalic and atraumatic, Oropharynx benign  Neck: Supple, no carotid bruits  Cardiac: Regular rate rhythm, no murmur  Musculoskeletal: No deformity   Neurological examination  Mentation: Alert oriented to time, place, history taking. Follows all commands speech and language fluent . Mini-Mental status exam scored 28/30 with 2 deficits in recall only. And naming test 10. Clock drawing 4/4. Geriatric depression scale 5 Cranial nerve II-XII: Pupils were equal round reactive to light extraocular movements were full, visual field were full on confrontational test. Facial sensation and strength were normal. hearing was intact to finger  rubbing bilaterally. Uvula tongue midline. head turning and shoulder shrug and were normal and symmetric.Tongue protrusion into cheek strength was normal.  Motor: normal bulk and tone, full strength in the BUE, BLE, fine finger movements normal, no pronator drift. No focal weakness. Mild LUE action tremor noted while drawing spirals and joining 2 points to make a straight line Sensory: normal and symmetric to light touch, pinprick, and vibration  Coordination: finger-nose-finger, heel-to-shin bilaterally, no dysmetria  Gait and Station: Rising up from seated position without assistance, normal stance, without trunk ataxia, moderate stride, good arm swing, smooth turning, able to perform tiptoe, and heel walking without difficulty.  Reflexes: equal and symmetric  ASSESSMENT AND PLAN 77 year African American male with upper extremity fine action tremors likely anxiety related. Benign essential tremor is possible though less likely. History of right brain subcortical infarct due to small vessel disease in December 2011 with no significant residual deficits. History of peripheral neuropathy  diagnosis but no real clinical symptoms or signs on exam or on nerve conduction/EMG exam. Vascular risk factors of hypertension and hyperlipidemia. Mild memory difficulties likely due to underlying  Suboptimally treated depression  PLAN:   I had a long discussion with the patient about his hand tremors and recommend increasing primidone to 125 mg twice daily if tolerated. I have discussed possible side effects with him and advised him to call me. I also personally reviewed his imaging studies and lab results with him and answered questions. Continue Plavix for stroke prevention and strict control of hypertension with blood pressure goal below 130/90, LDL goal below 100 mg percent. Continue follow-up with his psychiatrist for his depression. Return for follow-up in 3 months or call earlier if necessary.   Antony Contras, MD  02/18/2014, 10:49 AM [facility] [address], [place], [place] [address] [phone]  Note: This document was prepared with digital dictation and possible smart phrase technology. Any transcriptional errors that result from this process are unintentional.

## 2014-02-24 DIAGNOSIS — F332 Major depressive disorder, recurrent severe without psychotic features: Secondary | ICD-10-CM | POA: Diagnosis not present

## 2014-03-10 DIAGNOSIS — F332 Major depressive disorder, recurrent severe without psychotic features: Secondary | ICD-10-CM | POA: Diagnosis not present

## 2014-03-19 DIAGNOSIS — E780 Pure hypercholesterolemia: Secondary | ICD-10-CM | POA: Diagnosis not present

## 2014-03-19 DIAGNOSIS — R61 Generalized hyperhidrosis: Secondary | ICD-10-CM | POA: Diagnosis not present

## 2014-03-19 DIAGNOSIS — R51 Headache: Secondary | ICD-10-CM | POA: Diagnosis not present

## 2014-03-19 DIAGNOSIS — M797 Fibromyalgia: Secondary | ICD-10-CM | POA: Diagnosis not present

## 2014-03-19 DIAGNOSIS — I10 Essential (primary) hypertension: Secondary | ICD-10-CM | POA: Diagnosis not present

## 2014-03-26 DIAGNOSIS — F332 Major depressive disorder, recurrent severe without psychotic features: Secondary | ICD-10-CM | POA: Diagnosis not present

## 2014-04-07 DIAGNOSIS — F332 Major depressive disorder, recurrent severe without psychotic features: Secondary | ICD-10-CM | POA: Diagnosis not present

## 2014-04-21 DIAGNOSIS — F332 Major depressive disorder, recurrent severe without psychotic features: Secondary | ICD-10-CM | POA: Diagnosis not present

## 2014-05-07 DIAGNOSIS — F332 Major depressive disorder, recurrent severe without psychotic features: Secondary | ICD-10-CM | POA: Diagnosis not present

## 2014-05-19 DIAGNOSIS — F332 Major depressive disorder, recurrent severe without psychotic features: Secondary | ICD-10-CM | POA: Diagnosis not present

## 2014-05-29 ENCOUNTER — Encounter: Payer: Self-pay | Admitting: Neurology

## 2014-05-29 ENCOUNTER — Ambulatory Visit (INDEPENDENT_AMBULATORY_CARE_PROVIDER_SITE_OTHER): Payer: Medicare Other | Admitting: Neurology

## 2014-05-29 VITALS — BP 144/90 | HR 78 | Wt 190.6 lb

## 2014-05-29 DIAGNOSIS — E7849 Other hyperlipidemia: Secondary | ICD-10-CM

## 2014-05-29 DIAGNOSIS — E784 Other hyperlipidemia: Secondary | ICD-10-CM

## 2014-05-29 DIAGNOSIS — R7302 Impaired glucose tolerance (oral): Secondary | ICD-10-CM | POA: Diagnosis not present

## 2014-05-29 NOTE — Progress Notes (Signed)
PATIENT: [name] DOB: December 06, 1950  REASON FOR VISIT: follow up for essential tremor, history of stroke HISTORY FROM: patient  HISTORY OF PRESENT ILLNESS: 31 year African American male with upper extremity fine action tremors likely anxiety related. Benign essential tremor is possible though less likely. History of right brain subcortical infarct due to small vessel disease in December 2011 with no significant residual deficits. History of peripheral neuropathy diagnosis but no real clinical symptoms or signs on exam or on nerve conduction/EMG exam.. Vascular risk factors of hypertension and hyperlipidemia. New 50 llbs weightloss in 1 year without determined cause.  He returns for followup after his last visit on 12/08/11. He states his noticed some improvement in his tremors after starting the primidone which he takes 100 mg daily. He seems to be tolerating it well without any drowsiness, dryness of mouth or other side effects. He states payment on is helping him more than intra-and is wondering if he can come off the Inderal. He has no new complaints. He had lab work done on 12/08/11 which showed normal thyroid function tests and LFTs. He's had no new stroke or TIA symptoms. He remains on Plavix and is tolerating it well without side effects. He states his blood pressure is under good control and it is 131/80 today.   UPDATE 09/21/12 (LL): Mr. Wiechman returns for follow up today, last visit was on 03/21/12. He was increased on Primidone 125 mg last visit for tremor and was supposed to taper off Propranolol, but he states that he was not aware that he was to stop it. He states his tremor is about the same. He is going through a tough time personally, he is separated from his wife. He states that he was very depressed but is doing much better now. He continues to take daily Plavix with no excessive bleeding or bruising. HE states his blood pressure is usually well controlled but it is 145/97 in  office. He states he is to have his labs checked at his PCP in 6 weeks. He has no new complaints.   UPDATE 04/04/13 (LL):  Mr. Hetzer returns for follow up today, last visit was in September 2014.  He continues to take Primidone 125 mg daily for tremor.   He continues to take daily Plavix with no excessive bleeding or bruising. His last carotid doppler study in September was normal. BP today in office is 132/80, is better controlled with increase in BP medication.   He has notably gained almost 30 pounds since last visit, he explains he does not know why, not eating differently.  He has had changes though with his psychiatric meds though. Update 10/16/2013 : He returns for followup after his last visit 6 months ago. He continues to have mild tremors in his hands and is tolerating premature 125 mg at night fairly well without significant side effects. He states the tremors do not affect his routine or date and activities. She has been complaining of some paresthesias and discomfort involving his head mainly on the left side for 3-4 weeks this is present mainly in the morning but does improve as the day goes along. He denies headache or discomfort. Denies any sharp shooting pain or any vision loss. He has been complaining of some progressive memory difficulties. He does get lost easily and has to rely on his GPS. He forgets appointments and even really writes things down. He does have long-standing depression and does take medications for that. Update 02/18/14 :  He returns for follow-up after last visit 4 months ago. He reports slight worsening of his left hand tremor which at times now interferes with his activities of daily living during the day as well. He has not tried a higher dose of Mysoline be on 125 mg at bedtime yet but is willing to do so. He states his memory difficulties are unchanged. He remains compliant with his depression medications and sees a psychiatrist regularly. He had lab work done at last  visit on 10/16/13 which showed normal vitamin B12, TSH. RPR and HIV antibodies were negative. Carotid ultrasound done on 10/30/13 personally reviewed by me shows no significant extracranial stenosis. Update 05/29/2014 : He returns for follow-up after last visit 3 months ago. He has noted improvement in his tremor after increasing the dose of primidone to 125 mg twice daily which is tolerating without significant sleepiness or drowsiness but does complain of increased dryness of mouth. He actually informs me today that he did not realize at the last visit that he had run out of primidone and hence his tremor may have gotten worse at that time. He continues to tolerate Plavix well without significant bleeding or bruising and has not had any recurrent stroke or TIA symptoms. States his blood pressure is well controlled and is borderline today in office at 144/90. He has not had lipid profile checked for a while. REVIEW OF SYSTEMS: Full 14 system review of systems performed and notable only for: Appetite change, frequency of urination, bladder urgency, frequent waking, snoring, sleep talking, tremor and all other systems negative ALLERGIES: Allergies  Allergen Reactions  . Aspirin   . Lisinopril     critical  . Lithium     critical  . Oxycodone     HOME MEDICATIONS: Outpatient Prescriptions Prior to Visit  Medication Sig Dispense Refill  . amLODipine (NORVASC) 10 MG tablet Take 10 mg by mouth daily.    . ARIPiprazole (ABILIFY) 10 MG tablet Take 10 mg by mouth at bedtime.    . clindamycin (CLEOCIN) 150 MG capsule Take 150 mg by mouth as needed.  1  . clopidogrel (PLAVIX) 75 MG tablet Take 1 tablet (75 mg total) by mouth daily. 90 tablet 3  . diazepam (VALIUM) 2 MG tablet Take 2 mg by mouth as needed.    Marland Kitchen LOSARTAN POTASSIUM PO Take 50 mg by mouth daily.     Marland Kitchen lovastatin (MEVACOR) 10 MG tablet Take 10 mg by mouth at bedtime.    . modafinil (PROVIGIL) 200 MG tablet Take 200 mg by mouth. Taking 1 1/2  tablet    . omeprazole (PRILOSEC) 20 MG capsule Take 20 mg by mouth daily.      . primidone (MYSOLINE) 250 MG tablet Take 0.5 tablets (125 mg total) by mouth 2 (two) times daily. 90 tablet 3  . tadalafil (CIALIS) 5 MG tablet Take 5 mg by mouth daily as needed for erectile dysfunction.    . TraZODone HCl 300 MG TB24 Take by mouth once.    Marland Kitchen oxybutynin (DITROPAN-XL) 10 MG 24 hr tablet Take 10 mg by mouth at bedtime.    Marland Kitchen PARoxetine (PAXIL) 30 MG tablet Take 30 mg by mouth daily.    . valACYclovir (VALTREX) 1000 MG tablet Take 1,000 mg by mouth 2 (two) times daily.      No facility-administered medications prior to visit.     PHYSICAL EXAM  Filed Vitals:   05/29/14 1346  BP: 144/90  Pulse: 78  Weight: 190 lb  9.6 oz (86.456 kg)   Body mass index is 31.22 kg/(m^2).  Generalized: Well developed, in no acute distress  Head: normocephalic and atraumatic, Oropharynx benign  Neck: Supple, no carotid bruits  Cardiac: Regular rate rhythm, no murmur  Musculoskeletal: No deformity   Neurological examination  Mentation: Alert oriented to time, place, history taking. Follows all commands speech and language fluent . Mini-Mental status exam scored 28/30 with 2 deficits in recall only. And naming test 10. Clock drawing 4/4. Geriatric depression scale 5 Cranial nerve II-XII: Pupils were equal round reactive to light extraocular movements were full, visual field were full on confrontational test. Facial sensation and strength were normal. hearing was intact to finger rubbing bilaterally. Uvula tongue midline. head turning and shoulder shrug and were normal and symmetric.Tongue protrusion into cheek strength was normal.  Motor: normal bulk and tone, full strength in the BUE, BLE, fine finger movements normal, no pronator drift. No focal weakness. Mild L >R UE action tremor noted while drawing spirals and joining 2 points to make a straight line. No cogwheel rigidity or bradykinesia seen. Sensory: normal  and symmetric to light touch, pinprick, and vibration  Coordination: finger-nose-finger, heel-to-shin bilaterally, no dysmetria  Gait and Station: Rising up from seated position without assistance, normal stance, without trunk ataxia, moderate stride, good arm swing, smooth turning, able to perform tiptoe, and heel walking without difficulty.  Reflexes: equal and symmetric  ASSESSMENT AND PLAN 24 year African American male with upper extremity fine action tremors likely  Anxiety versus benign essential tremor . History of right brain subcortical infarct due to small vessel disease in December 2011 with no significant residual deficits. History of peripheral neuropathy diagnosis but no real clinical symptoms or signs on exam or on nerve conduction/EMG exam. Vascular risk factors of hypertension and hyperlipidemia. Mild memory difficulties likely due to underlying  Suboptimally treated depression  PLAN:   I had a long discussion with the patient with regards to his tremors which seem to have responded to increased dose of primidone to 125 mg twice daily. Continue the current dose and continue Plavix for stroke prevention with strict control of hypertension with blood pressure goal below 130/90, diabetes with hemoglobin A1c goal below 6.5 and lipids with LDL cholesterol goal below 70 mg percent. Check lipid profile, LFTs and hemoglobin A1c. Return for follow-up in 6 months or call earlier if necessary.  Antony Contras, MD  05/29/2014, 4:18 PM [facility] [address] [place], [place] [address] [phone]  Note: This document was prepared with digital dictation and possible smart phrase technology. Any transcriptional errors that result from this process are unintentional.

## 2014-05-29 NOTE — Patient Instructions (Signed)
I had a long discussion with the patient with regards to his tremors which seem to have responded to increased dose of primidone to 125 mg twice daily. Continue the current dose and continue Plavix for stroke prevention with strict control of hypertension with blood pressure goal below 130/90, diabetes with hemoglobin A1c goal below 6.5 and lipids with LDL cholesterol goal below 70 mg percent. Check lipid profile, LFTs and hemoglobin A1c. Return for follow-up in 6 months or call earlier if necessary.

## 2014-05-30 LAB — HEMOGLOBIN A1C
Est. average glucose Bld gHb Est-mCnc: 111 mg/dL
Hgb A1c MFr Bld: 5.5 % (ref 4.8–5.6)

## 2014-05-30 LAB — LIPID PANEL
Chol/HDL Ratio: 5 ratio units (ref 0.0–5.0)
Cholesterol, Total: 150 mg/dL (ref 100–199)
HDL: 30 mg/dL — ABNORMAL LOW (ref >39)
LDL Calculated: 63 mg/dL (ref 0–99)
Triglycerides: 284 mg/dL — ABNORMAL HIGH (ref 0–149)
VLDL Cholesterol Cal: 57 mg/dL — ABNORMAL HIGH (ref 5–40)

## 2014-05-30 LAB — HEPATIC FUNCTION PANEL
ALT: 10 IU/L (ref 0–44)
AST: 15 IU/L (ref 0–40)
Albumin: 4.7 g/dL (ref 3.6–4.8)
Alkaline Phosphatase: 41 IU/L (ref 39–117)
Bilirubin Total: 0.3 mg/dL (ref 0.0–1.2)
Bilirubin, Direct: 0.1 mg/dL (ref 0.00–0.40)
Total Protein: 7.3 g/dL (ref 6.0–8.5)

## 2014-06-02 ENCOUNTER — Telehealth: Payer: Self-pay | Admitting: *Deleted

## 2014-06-02 NOTE — Telephone Encounter (Signed)
Per Dr [name], spoke to patient and gave him Dr Clydene Fake detailed message, re: recent lab results. Advised [name] that he needs to see Dr Orpah Melter, PCP to discuss adding medication to lower it. Pt confirmed Dr Olen Pel as PCP, and he verbally agreed to call his PCP and schedule appointment to be seen for his high triglycerides level.

## 2014-06-02 NOTE — Telephone Encounter (Signed)
-----   Message from Garvin Fila, MD sent at 05/31/2014  1:17 PM EDT ----- [name] informed patient that total cholesterol and bad cholesterol LDL are both satisfactory but triglycerides is quite high and he needs to see his primary physician to consider adding a medication to lower it

## 2014-06-09 DIAGNOSIS — R350 Frequency of micturition: Secondary | ICD-10-CM | POA: Diagnosis not present

## 2014-06-09 DIAGNOSIS — R351 Nocturia: Secondary | ICD-10-CM | POA: Diagnosis not present

## 2014-06-09 DIAGNOSIS — N529 Male erectile dysfunction, unspecified: Secondary | ICD-10-CM | POA: Diagnosis not present

## 2014-06-09 DIAGNOSIS — N401 Enlarged prostate with lower urinary tract symptoms: Secondary | ICD-10-CM | POA: Diagnosis not present

## 2014-06-09 DIAGNOSIS — R3915 Urgency of urination: Secondary | ICD-10-CM | POA: Diagnosis not present

## 2014-06-18 DIAGNOSIS — F332 Major depressive disorder, recurrent severe without psychotic features: Secondary | ICD-10-CM | POA: Diagnosis not present

## 2014-06-20 DIAGNOSIS — S90222A Contusion of left lesser toe(s) with damage to nail, initial encounter: Secondary | ICD-10-CM | POA: Diagnosis not present

## 2014-06-20 DIAGNOSIS — R600 Localized edema: Secondary | ICD-10-CM | POA: Diagnosis not present

## 2014-06-20 DIAGNOSIS — R200 Anesthesia of skin: Secondary | ICD-10-CM | POA: Diagnosis not present

## 2014-06-20 DIAGNOSIS — M545 Low back pain: Secondary | ICD-10-CM | POA: Diagnosis not present

## 2014-06-20 DIAGNOSIS — K5900 Constipation, unspecified: Secondary | ICD-10-CM | POA: Diagnosis not present

## 2014-06-20 DIAGNOSIS — Z1389 Encounter for screening for other disorder: Secondary | ICD-10-CM | POA: Diagnosis not present

## 2014-06-27 DIAGNOSIS — J029 Acute pharyngitis, unspecified: Secondary | ICD-10-CM | POA: Diagnosis not present

## 2014-06-27 DIAGNOSIS — I10 Essential (primary) hypertension: Secondary | ICD-10-CM | POA: Diagnosis not present

## 2014-07-07 DIAGNOSIS — F332 Major depressive disorder, recurrent severe without psychotic features: Secondary | ICD-10-CM | POA: Diagnosis not present

## 2014-07-10 DIAGNOSIS — F902 Attention-deficit hyperactivity disorder, combined type: Secondary | ICD-10-CM | POA: Diagnosis not present

## 2014-07-10 DIAGNOSIS — F332 Major depressive disorder, recurrent severe without psychotic features: Secondary | ICD-10-CM | POA: Diagnosis not present

## 2014-07-16 DIAGNOSIS — R3915 Urgency of urination: Secondary | ICD-10-CM | POA: Diagnosis not present

## 2014-07-16 DIAGNOSIS — N529 Male erectile dysfunction, unspecified: Secondary | ICD-10-CM | POA: Diagnosis not present

## 2014-07-16 DIAGNOSIS — R350 Frequency of micturition: Secondary | ICD-10-CM | POA: Diagnosis not present

## 2014-07-16 DIAGNOSIS — R351 Nocturia: Secondary | ICD-10-CM | POA: Diagnosis not present

## 2014-07-16 DIAGNOSIS — N401 Enlarged prostate with lower urinary tract symptoms: Secondary | ICD-10-CM | POA: Diagnosis not present

## 2014-07-22 DIAGNOSIS — F332 Major depressive disorder, recurrent severe without psychotic features: Secondary | ICD-10-CM | POA: Diagnosis not present

## 2014-07-23 DIAGNOSIS — R61 Generalized hyperhidrosis: Secondary | ICD-10-CM | POA: Diagnosis not present

## 2014-07-23 DIAGNOSIS — E780 Pure hypercholesterolemia: Secondary | ICD-10-CM | POA: Diagnosis not present

## 2014-07-23 DIAGNOSIS — I10 Essential (primary) hypertension: Secondary | ICD-10-CM | POA: Diagnosis not present

## 2014-08-06 DIAGNOSIS — L608 Other nail disorders: Secondary | ICD-10-CM | POA: Diagnosis not present

## 2014-08-13 DIAGNOSIS — F332 Major depressive disorder, recurrent severe without psychotic features: Secondary | ICD-10-CM | POA: Diagnosis not present

## 2014-08-16 ENCOUNTER — Encounter (HOSPITAL_COMMUNITY): Payer: Self-pay | Admitting: Emergency Medicine

## 2014-08-16 ENCOUNTER — Emergency Department (HOSPITAL_COMMUNITY)
Admission: EM | Admit: 2014-08-16 | Discharge: 2014-08-16 | Disposition: A | Discharge status: Home / Self Care | Payer: Medicare Other | Attending: Emergency Medicine | Admitting: Emergency Medicine

## 2014-08-16 DIAGNOSIS — Z7902 Long term (current) use of antithrombotics/antiplatelets: Secondary | ICD-10-CM | POA: Diagnosis not present

## 2014-08-16 DIAGNOSIS — Z8673 Personal history of transient ischemic attack (TIA), and cerebral infarction without residual deficits: Secondary | ICD-10-CM | POA: Diagnosis not present

## 2014-08-16 DIAGNOSIS — R42 Dizziness and giddiness: Secondary | ICD-10-CM

## 2014-08-16 DIAGNOSIS — K219 Gastro-esophageal reflux disease without esophagitis: Secondary | ICD-10-CM | POA: Insufficient documentation

## 2014-08-16 DIAGNOSIS — Z87891 Personal history of nicotine dependence: Secondary | ICD-10-CM | POA: Diagnosis not present

## 2014-08-16 DIAGNOSIS — I10 Essential (primary) hypertension: Secondary | ICD-10-CM | POA: Insufficient documentation

## 2014-08-16 DIAGNOSIS — Z79899 Other long term (current) drug therapy: Secondary | ICD-10-CM | POA: Insufficient documentation

## 2014-08-16 DIAGNOSIS — R4182 Altered mental status, unspecified: Secondary | ICD-10-CM | POA: Diagnosis present

## 2014-08-16 DIAGNOSIS — E669 Obesity, unspecified: Secondary | ICD-10-CM | POA: Diagnosis not present

## 2014-08-16 DIAGNOSIS — Z8619 Personal history of other infectious and parasitic diseases: Secondary | ICD-10-CM | POA: Insufficient documentation

## 2014-08-16 DIAGNOSIS — R404 Transient alteration of awareness: Secondary | ICD-10-CM | POA: Diagnosis not present

## 2014-08-16 DIAGNOSIS — E785 Hyperlipidemia, unspecified: Secondary | ICD-10-CM | POA: Insufficient documentation

## 2014-08-16 LAB — I-STAT CHEM 8, ED
BUN: 18 mg/dL (ref 6–20)
Calcium, Ion: 1.2 mmol/L (ref 1.13–1.30)
Chloride: 102 mmol/L (ref 101–111)
Creatinine, Ser: 1.7 mg/dL — ABNORMAL HIGH (ref 0.61–1.24)
Glucose, Bld: 82 mg/dL (ref 65–99)
HCT: 42 % (ref 39.0–52.0)
Hemoglobin: 14.3 g/dL (ref 13.0–17.0)
Potassium: 3.5 mmol/L (ref 3.5–5.1)
Sodium: 142 mmol/L (ref 135–145)
TCO2: 26 mmol/L (ref 0–100)

## 2014-08-16 LAB — CBG MONITORING, ED: Glucose-Capillary: 81 mg/dL (ref 65–99)

## 2014-08-16 MED ORDER — SODIUM CHLORIDE 0.9 % IV BOLUS (SEPSIS): 1000.0000 mL | Freq: Once | INTRAVENOUS | Status: DC

## 2014-08-16 NOTE — ED Notes (Signed)
Pt left with all belongings and ambulated out of treatment area.  

## 2014-08-16 NOTE — Discharge Instructions (Signed)

## 2014-08-16 NOTE — ED Provider Notes (Signed)
CSN: [id number]     Arrival date & time 08/16/14  0143 History  This chart was scribed for Jola Schmidt, MD by Chester Holstein, ED Scribe. This patient was seen in room B17C/B17C and the patient's care was started at 2:01 AM.    Chief Complaint  Patient presents with  . Altered Mental Status     The history is provided by the patient. No language interpreter was used.   HPI Comments: [name] is a 64 y.o. male with PMHx of CVA, HTN, herpes, and HLD who presents to the Emergency Department complaining of weakness and lightheadedness with onset this evening. He also reports short term memory loss for several months. Pt denies changes in food and fluid intake. He states he has been eating once a day for last couple of months. Pt has been compliant with his medications (see list below). He denies fall, head injury, and LOC. Pt's PCP is Dr. Olen Pel. He states he has not discussed his symptoms with his PCP, reports "I forgot."   Past Medical History  Diagnosis Date  . Stroke   . Depression   . Hypertension   . Herpes   . Obesity   . GERD (gastroesophageal reflux disease)   . Hyperlipidemia   . Frequent urination   . Urgency of urination    Past Surgical History  Procedure Laterality Date  . Spinal fusion    . Vasectomy    . Disckec     Family History  Problem Relation Age of Onset  . Hypertension Mother   . Hypertension Father   . Hypertension Sister   . Hypertension Brother   . Cancer Maternal Aunt   . Cancer - Ovarian Maternal Grandmother   . Diabetes Mellitus I Paternal Grandmother   . Hypertension Paternal Grandfather   . Cancer Maternal Aunt    History  Substance Use Topics  . Smoking status: Former Research scientist (life sciences)  . Smokeless tobacco: Never Used  . Alcohol Use: 0.0 oz/week    0 Standard drinks or equivalent per week     Comment: socially    Review of Systems A complete 10 system review of systems was obtained and all systems are negative except as noted in the HPI  and PMH.     Allergies  Aspirin; Lisinopril; Lithium; and Oxycodone  Home Medications   Prior to Admission medications   Medication Sig Start Date End Date Taking? Authorizing Provider  aluminum chloride (DRYSOL) 20 % external solution Apply topically at bedtime.    Historical Provider, MD  amLODipine (NORVASC) 10 MG tablet Take 10 mg by mouth daily.    Historical Provider, MD  ARIPiprazole (ABILIFY) 10 MG tablet Take 10 mg by mouth at bedtime.    Historical Provider, MD  clindamycin (CLEOCIN) 150 MG capsule Take 150 mg by mouth as needed. 01/28/14   Historical Provider, MD  clopidogrel (PLAVIX) 75 MG tablet Take 1 tablet (75 mg total) by mouth daily. 04/04/13   Philmore Pali, NP  diazepam (VALIUM) 2 MG tablet Take 2 mg by mouth as needed. 10/14/13   Historical Provider, MD  LOSARTAN POTASSIUM PO Take 50 mg by mouth daily.     Historical Provider, MD  lovastatin (MEVACOR) 10 MG tablet Take 10 mg by mouth at bedtime.    Historical Provider, MD  modafinil (PROVIGIL) 200 MG tablet Take 200 mg by mouth. Taking 1 1/2 tablet    Historical Provider, MD  omeprazole (PRILOSEC) 20 MG capsule Take 20 mg by mouth  daily.      Historical Provider, MD  PARoxetine (PAXIL) 40 MG tablet Take 40 mg by mouth daily. 04/23/14   Historical Provider, MD  primidone (MYSOLINE) 250 MG tablet Take 0.5 tablets (125 mg total) by mouth 2 (two) times daily. 02/18/14   Garvin Fila, MD  REXULTI 1 MG TABS Take 1 tablet by mouth every morning. 03/27/14   Historical Provider, MD  REXULTI 3 MG TABS Take 1 tablet by mouth every morning. 05/19/14   Historical Provider, MD  tadalafil (CIALIS) 5 MG tablet Take 5 mg by mouth daily as needed for erectile dysfunction.    Historical Provider, MD  TraZODone HCl 300 MG TB24 Take by mouth once.    Historical Provider, MD  valACYclovir (VALTREX) 500 MG tablet Take 500 mg by mouth daily. 05/06/14   Historical Provider, MD   BP 144/88 mmHg  Pulse 64  Temp(Src) 98.1 F (36.7 C) (Oral)  SpO2  97% Physical Exam  Constitutional: He is oriented to person, place, and time. He appears well-developed and well-nourished.  HENT:  Head: Normocephalic and atraumatic.  Eyes: EOM are normal.  Neck: Normal range of motion.  Cardiovascular: Normal rate, regular rhythm, normal heart sounds and intact distal pulses.   Pulmonary/Chest: Effort normal and breath sounds normal. No respiratory distress.  Abdominal: Soft. He exhibits no distension. There is no tenderness.  Musculoskeletal: Normal range of motion.  Neurological: He is alert and oriented to person, place, and time.  Skin: Skin is warm and dry.  Psychiatric: He has a normal mood and affect. Judgment normal.  Nursing note and vitals reviewed.   ED Course  Procedures (including critical care time) DIAGNOSTIC STUDIES: Oxygen Saturation is 97% on room air, normal by my interpretation.    COORDINATION OF CARE: 2:06 AM Discussed treatment plan with patient at beside, the patient agrees with the plan and has no further questions at this time.   Labs Review Labs Reviewed  I-STAT CHEM 8, ED - Abnormal; Notable for the following:    Creatinine, Ser 1.70 (*)    All other components within normal limits  CBG MONITORING, ED   BUN  Date Value Ref Range Status  08/16/2014 18 6 - 20 mg/dL Final  11/06/2010 7 6 - 23 mg/dL Final  11/01/2010 8 6 - 23 mg/dL Final  10/31/2010 9 6 - 23 mg/dL Final   CREATININE, SER  Date Value Ref Range Status  08/16/2014 1.70* 0.61 - 1.24 mg/dL Final  11/06/2010 1.02 0.50 - 1.35 mg/dL Final  11/01/2010 1.04 0.50 - 1.35 mg/dL Final  10/31/2010 1.01 0.50 - 1.35 mg/dL Final       Imaging Review No results found.   EKG Interpretation   Date/Time:  Saturday August 16 2014 01:50:26 EDT Ventricular Rate:  66 PR Interval:  189 QRS Duration: 147 QT Interval:  441 QTC Calculation: 462 R Axis:   41 Text Interpretation:  Sinus rhythm Right bundle branch block No  significant change was found  Confirmed by Kamea Dacosta  MD, Vonne Mcdanel ([id number]) on  08/16/2014 2:55:01 AM      MDM   Final diagnoses:  Lightheaded   The patient is overall well-appearing.  He is compliant with his medications.  He is alert and oriented at this time.  He honestly seems more lonely than anything.  Last a follow-up with his primary care physician.  Medical screening examination completed.  Vital signs are normal.  Nontoxic appearing.  Discharge home in good condition.  Mild renal insufficiency  as compared to priors.  His suspect this is his new baseline.  I recommended oral hydration at home   I personally performed the services described in this documentation, which was scribed in my presence. The recorded information has been reviewed and is accurate.        Jola Schmidt, MD 08/16/14 0300

## 2014-08-16 NOTE — ED Notes (Signed)
Pt states his mental status has been altered for several days. Pt stated he can not keep his thoughts together and organized. Pt with mental health history, and states that he has been compliant with his meds.

## 2014-09-08 DIAGNOSIS — F332 Major depressive disorder, recurrent severe without psychotic features: Secondary | ICD-10-CM | POA: Diagnosis not present

## 2014-09-24 DIAGNOSIS — F902 Attention-deficit hyperactivity disorder, combined type: Secondary | ICD-10-CM | POA: Diagnosis not present

## 2014-09-24 DIAGNOSIS — F332 Major depressive disorder, recurrent severe without psychotic features: Secondary | ICD-10-CM | POA: Diagnosis not present

## 2014-09-29 DIAGNOSIS — F332 Major depressive disorder, recurrent severe without psychotic features: Secondary | ICD-10-CM | POA: Diagnosis not present

## 2014-10-15 DIAGNOSIS — F332 Major depressive disorder, recurrent severe without psychotic features: Secondary | ICD-10-CM | POA: Diagnosis not present

## 2014-10-23 DIAGNOSIS — F332 Major depressive disorder, recurrent severe without psychotic features: Secondary | ICD-10-CM | POA: Diagnosis not present

## 2014-10-23 DIAGNOSIS — F902 Attention-deficit hyperactivity disorder, combined type: Secondary | ICD-10-CM | POA: Diagnosis not present

## 2014-11-03 DIAGNOSIS — E785 Hyperlipidemia, unspecified: Secondary | ICD-10-CM | POA: Diagnosis not present

## 2014-11-03 DIAGNOSIS — R413 Other amnesia: Secondary | ICD-10-CM | POA: Diagnosis not present

## 2014-11-03 DIAGNOSIS — I10 Essential (primary) hypertension: Secondary | ICD-10-CM | POA: Diagnosis not present

## 2014-11-08 DIAGNOSIS — R1084 Generalized abdominal pain: Secondary | ICD-10-CM | POA: Diagnosis not present

## 2014-11-08 DIAGNOSIS — K5900 Constipation, unspecified: Secondary | ICD-10-CM | POA: Diagnosis not present

## 2014-11-08 DIAGNOSIS — R1032 Left lower quadrant pain: Secondary | ICD-10-CM | POA: Diagnosis not present

## 2014-11-10 DIAGNOSIS — F332 Major depressive disorder, recurrent severe without psychotic features: Secondary | ICD-10-CM | POA: Diagnosis not present

## 2014-11-14 ENCOUNTER — Encounter: Payer: Self-pay | Admitting: Neurology

## 2014-11-14 ENCOUNTER — Ambulatory Visit (INDEPENDENT_AMBULATORY_CARE_PROVIDER_SITE_OTHER): Payer: Medicare Other | Admitting: Neurology

## 2014-11-14 VITALS — BP 138/91 | HR 88 | Ht 65.5 in | Wt 181.6 lb

## 2014-11-14 DIAGNOSIS — F0390 Unspecified dementia without behavioral disturbance: Secondary | ICD-10-CM

## 2014-11-14 NOTE — Patient Instructions (Signed)
I had a long discussion with the patient and his friend regarding his worsening memory loss which needs further evaluation. Check MRI scan of the brain for structural brain lesions as well as EEG first silent seizures. I have advised him to see his psychiatrist for optimization of his depression medications. Continue Plavix for stroke prevention with strict control of hypertension with blood pressure goal below 130/90 and lipids with LDL cholesterol goal below 100 mg percent. Continue primidone in the current dose of 125 mg twice daily for his essential tremor which appears to be quite well controlled. I advised him to start taking fish oil daily and to increase participation in cognitively challenging activities like playing bridge, sudoku and crossword puzzles. I have also advised him memory compensation strategies. Return for follow-up in 2 months if no treatable etiologies found may consider trial of Aricept at that visit. Memory Compensation Strategies  1. Use "WARM" strategy.  W= write it down  A= associate it  R= repeat it  M= make a mental note  2.   You can keep a Social worker.  Use a 3-ring notebook with sections for the following: calendar, important names and phone numbers,  medications, doctors' names/phone numbers, lists/reminders, and a section to journal what you did  each day.   3.    Use a calendar to write appointments down.  4.    Write yourself a schedule for the day.  This can be placed on the calendar or in a separate section of the Memory Notebook.  Keeping a  regular schedule can help memory.  5.    Use medication organizer with sections for each day or morning/evening pills.  You may need help loading it  6.    Keep a basket, or pegboard by the door.  Place items that you need to take out with you in the basket or on the pegboard.  You may also want to  include a message board for reminders.  7.    Use sticky notes.  Place sticky notes with reminders in a place  where the task is performed.  For example: " turn off the  stove" placed by the stove, "lock the door" placed on the door at eye level, " take your medications" on  the bathroom mirror or by the place where you normally take your medications.  8.    Use alarms/timers.  Use while cooking to remind yourself to check on food or as a reminder to take your medicine, or as a  reminder to make a call, or as a reminder to perform another task, etc.

## 2014-11-14 NOTE — Progress Notes (Signed)
PATIENT: [name] DOB: Mar 11, 1950  REASON FOR VISIT: follow up for essential tremor, history of stroke HISTORY FROM: patient  HISTORY OF PRESENT ILLNESS: 15 year African American male with upper extremity fine action tremors likely anxiety related. Benign essential tremor is possible though less likely. History of right brain subcortical infarct due to small vessel disease in December 2011 with no significant residual deficits. History of peripheral neuropathy diagnosis but no real clinical symptoms or signs on exam or on nerve conduction/EMG exam.. Vascular risk factors of hypertension and hyperlipidemia. New 50 llbs weightloss in 1 year without determined cause.  He returns for followup after his last visit on 12/08/11. He states his noticed some improvement in his tremors after starting the primidone which he takes 100 mg daily. He seems to be tolerating it well without any drowsiness, dryness of mouth or other side effects. He states payment on is helping him more than intra-and is wondering if he can come off the Inderal. He has no new complaints. He had lab work done on 12/08/11 which showed normal thyroid function tests and LFTs. He's had no new stroke or TIA symptoms. He remains on Plavix and is tolerating it well without side effects. He states his blood pressure is under good control and it is 131/80 today.   UPDATE 09/21/12 (LL): [name] returns for follow up today, last visit was on 03/21/12. He was increased on Primidone 125 mg last visit for tremor and was supposed to taper off Propranolol, but he states that he was not aware that he was to stop it. He states his tremor is about the same. He is going through a tough time personally, he is separated from his wife. He states that he was very depressed but is doing much better now. He continues to take daily Plavix with no excessive bleeding or bruising. HE states his blood pressure is usually well controlled but it is 145/97 in  office. He states he is to have his labs checked at his PCP in 6 weeks. He has no new complaints.   UPDATE 04/04/13 (LL):  [name] returns for follow up today, last visit was in September 2014.  He continues to take Primidone 125 mg daily for tremor.   He continues to take daily Plavix with no excessive bleeding or bruising. His last carotid doppler study in September was normal. BP today in office is 132/80, is better controlled with increase in BP medication.   He has notably gained almost 30 pounds since last visit, he explains he does not know why, not eating differently.  He has had changes though with his psychiatric meds though. Update 10/16/2013 : He returns for followup after his last visit 6 months ago. He continues to have mild tremors in his hands and is tolerating premature 125 mg at night fairly well without significant side effects. He states the tremors do not affect his routine or date and activities. She has been complaining of some paresthesias and discomfort involving his head mainly on the left side for 3-4 weeks this is present mainly in the morning but does improve as the day goes along. He denies headache or discomfort. Denies any sharp shooting pain or any vision loss. He has been complaining of some progressive memory difficulties. He does get lost easily and has to rely on his GPS. He forgets appointments and even really writes things down. He does have long-standing depression and does take medications for that. Update 02/18/14 :  He returns for follow-up after last visit 4 months ago. He reports slight worsening of his left hand tremor which at times now interferes with his activities of daily living during the day as well. He has not tried a higher dose of Mysoline be on 125 mg at bedtime yet but is willing to do so. He states his memory difficulties are unchanged. He remains compliant with his depression medications and sees a psychiatrist regularly. He had lab work done at last  visit on 10/16/13 which showed normal vitamin B12, TSH. RPR and HIV antibodies were negative. Carotid ultrasound done on 10/30/13 personally reviewed by me shows no significant extracranial stenosis. Update 11/14/2014 : He returns for follow-up after last visit 8 months ago. Is accompanied by a friend [name]. He has noticed significant worsening of his memory particularly short-term. This has been gradual over several years but more recently. The patient states that he has been regularly seeing a psychiatrist [name] on a monthly basis and he is on Paxil 40 mg a day. On Mini-Mental status testing today scored 21/30 which is significant decline from 28/30 at last visit. Patient however remains independent in activities of daily living. He lives alone. He states his hand tremors are quite minimal and not bothersome. He remains on primidone 125 mg twice daily. He denies any recent headaches, falls or head injuries. Prior to last visit he had vitamin B12, TSH and RPR tested which were normal. REVIEW OF SYSTEMS: Full 14 system review of systems performed and notable only for: Abdominal swelling, constipation, vomiting, memory loss, speech difficulty, confusion, depression, memory loss, tremors and all other systems negative ALLERGIES: Allergies  Allergen Reactions  . Aspirin   . Lisinopril     critical  . Lithium     critical  . Oxycodone     HOME MEDICATIONS: Outpatient Prescriptions Prior to Visit  Medication Sig Dispense Refill  . amLODipine (NORVASC) 10 MG tablet Take 10 mg by mouth daily.    . diazepam (VALIUM) 2 MG tablet Take 2 mg by mouth as needed.    Marland Kitchen LOSARTAN POTASSIUM PO Take 50 mg by mouth daily.     Marland Kitchen lovastatin (MEVACOR) 10 MG tablet Take 10 mg by mouth at bedtime.    Marland Kitchen omeprazole (PRILOSEC) 20 MG capsule Take 20 mg by mouth daily.      Marland Kitchen PARoxetine (PAXIL) 40 MG tablet Take 40 mg by mouth daily.  1  . primidone (MYSOLINE) 250 MG tablet Take 0.5 tablets (125 mg total) by  mouth 2 (two) times daily. 90 tablet 3  . REXULTI 3 MG TABS Take 1 tablet by mouth every morning.  0  . tadalafil (CIALIS) 5 MG tablet Take 5 mg by mouth daily as needed for erectile dysfunction.    . valACYclovir (VALTREX) 500 MG tablet Take 500 mg by mouth daily.  11  . aluminum chloride (DRYSOL) 20 % external solution Apply topically at bedtime.    . ARIPiprazole (ABILIFY) 10 MG tablet Take 10 mg by mouth at bedtime.    . clindamycin (CLEOCIN) 150 MG capsule Take 150 mg by mouth as needed.  1  . clopidogrel (PLAVIX) 75 MG tablet Take 1 tablet (75 mg total) by mouth daily. 90 tablet 3  . modafinil (PROVIGIL) 200 MG tablet Take 200 mg by mouth. Taking 1 1/2 tablet    . REXULTI 1 MG TABS Take 1 tablet by mouth every morning.  0  . TraZODone HCl 300 MG TB24 Take by mouth  once.     No facility-administered medications prior to visit.     PHYSICAL EXAM  Filed Vitals:   11/14/14 1018  BP: 138/91  Pulse: 88  Height: 5' 5.5" (1.664 m)  Weight: 181 lb 9.6 oz (82.373 kg)   Body mass index is 29.75 kg/(m^2).  Generalized: Well developed middle aged african [place] male, in no acute distress  Head: normocephalic and atraumatic,   Neck: Supple, no carotid bruits  Cardiac: Regular rate rhythm, no murmur  Musculoskeletal: No deformity   Neurological examination  Mentation: Alert oriented to time, place, history taking. Follows all commands speech and language fluent . Mini-Mental status exam scored 21/30( last visit28/30 )with 2 deficits in recall only.  . Clock drawing 3/4. Geriatric depression scale 8 mildly depressed Cranial nerve II-XII: Pupils were equal round reactive to light extraocular movements were full, visual field were full on confrontational test. Facial sensation and strength were normal. hearing was intact to finger rubbing bilaterally. Uvula tongue midline. head turning and shoulder shrug and were normal and symmetric.Tongue protrusion into cheek strength was normal.  Motor:  normal bulk and tone, full strength in the BUE, BLE, fine finger movements normal, no pronator drift. No focal weakness. Mild LUE action tremor noted while drawing spirals and joining 2 points to make a straight line Sensory: normal and symmetric to light touch, pinprick, and vibration  Coordination: finger-nose-finger, heel-to-shin bilaterally, no dysmetria  Gait and Station: Rising up from seated position without assistance, normal stance, without trunk ataxia, moderate stride, good arm swing, smooth turning, able to perform tiptoe, and heel walking without difficulty.  Reflexes: equal and symmetric  ASSESSMENT AND PLAN 58 year African American male with upper extremity fine action tremors likely anxiety related. Benign essential tremor is possible though less likely. History of right brain subcortical infarct due to small vessel disease in December 2011 with no significant residual deficits. History of peripheral neuropathy diagnosis but no real clinical symptoms or signs on exam or on nerve conduction/EMG exam. Vascular risk factors of hypertension and hyperlipidemia. Mild memory difficulties likely due to underlying  Suboptimally treated depression versus early dementia which appear to have progressed  PLAN:  I had a long discussion with the patient and his friend regarding his worsening memory loss which needs further evaluation. Check MRI scan of the brain for structural brain lesions as well as EEG first silent seizures. I have advised him to see his psychiatrist for optimization of his depression medications. Continue Plavix for stroke prevention with strict control of hypertension with blood pressure goal below 130/90 and lipids with LDL cholesterol goal below 100 mg percent. Continue primidone in the current dose of 125 mg twice daily for his essential tremor which appears to be quite well controlled. I advised him to start taking fish oil daily and to increase participation in cognitively  challenging activities like playing bridge, sudoku and crossword puzzles. I have also advised him memory compensation strategies. Return for follow-up in 2 months if no treatable etiologies found may consider trial of Aricept at that visit.   Antony Contras, MD  11/14/2014, 12:48 PM [facility] [address] [place], [place] [address] [phone]  Note: This document was prepared with digital dictation and possible smart phrase technology. Any transcriptional errors that result from this process are unintentional.

## 2014-11-18 ENCOUNTER — Telehealth: Payer: Self-pay | Admitting: Neurology

## 2014-11-18 NOTE — Telephone Encounter (Signed)
Rn call patients sister Velvet back concerning her brothers test and appt. Velvet stated she is on the DPR form for her brother to talk to. She explain that her brother is unsafe to be at home alone. Pt is driving and should not be because of his memory. Rn change the contact numbers to Remo Lipps which is patients cousin. Velvet was given dates of patients next appt in January 2016. Rn explain to patients sister that he does have a MRI and EEG orders, and he will be schedule. Rn explain that a staff person does do the scheduling and authorization for the test. Rn also gave Velvet the date of the EEG for the patient. Rn explain pt has to arrive 15 min early for all his appts. Velvet stated she will be at the next appt in January 2017.

## 2014-11-18 NOTE — Telephone Encounter (Signed)
Patient's sister Haskell Riling is calling and would like to talk with you about her brother's condition and  be present for tests that the patient might be having soon according to [place].  Please call.  Thanks!

## 2014-11-20 DIAGNOSIS — F332 Major depressive disorder, recurrent severe without psychotic features: Secondary | ICD-10-CM | POA: Diagnosis not present

## 2014-11-26 ENCOUNTER — Telehealth: Payer: Self-pay | Admitting: Neurology

## 2014-11-26 NOTE — Telephone Encounter (Signed)
[facility] returned call. Pt was referred to this office and they would like the office notes from that visit. Please call [phone],Darla

## 2014-11-26 NOTE — Telephone Encounter (Signed)
Rn call [place] in [place] about office notes The office stated the administrator call and was another line. Rn gave name and contact for a return phone call. Rn stated that the patient has to sign a release form to have office notes fax. [place] stated they will call back after lunch.

## 2014-11-26 NOTE — Telephone Encounter (Signed)
Rn call Darla back at [place] office. Pt was referred by PCP for memory issues and decline. Rn talk to Hilda Blades in medical records, and she stated a release form is not needed because they referred patient. Office visits can be sent and fax to the referring office.Fax number is [phone].

## 2014-11-26 NOTE — Telephone Encounter (Signed)
Rn fax last office note to [place] from [place]. Dr. Christella Noa was the referring MD. [place] fax and confirm.

## 2014-11-26 NOTE — Telephone Encounter (Signed)
[place] in [place] would like pt latest office notes. Please call and advise fax:.[place]

## 2014-12-01 DIAGNOSIS — F332 Major depressive disorder, recurrent severe without psychotic features: Secondary | ICD-10-CM | POA: Diagnosis not present

## 2014-12-03 ENCOUNTER — Ambulatory Visit
Admission: RE | Admit: 2014-12-03 | Discharge: 2014-12-03 | Disposition: A | Discharge status: Home / Self Care | Payer: Medicare Other | Source: Ambulatory Visit | Attending: Neurology | Admitting: Neurology

## 2014-12-03 DIAGNOSIS — F0390 Unspecified dementia without behavioral disturbance: Secondary | ICD-10-CM | POA: Diagnosis not present

## 2014-12-04 ENCOUNTER — Telehealth: Payer: Self-pay | Admitting: Neurology

## 2014-12-04 NOTE — Telephone Encounter (Signed)
[employer] [phone] or cell [phone] will be faxing over notes on patient and would like for Dr. [name] to take a look at. Patient is having terrible memory issues, worse than usual. [name] will be adjusting medications.

## 2014-12-04 NOTE — Telephone Encounter (Signed)
Lft vm for [name] at [phone] about faxing over forms for patient.

## 2014-12-05 NOTE — Telephone Encounter (Signed)
-----   Message from Garvin Fila, MD sent at 12/05/2014 12:00 PM EST ----- Kindly inform patient that MRI brain shows mild changes of hardening of arteries which are unchanged from prior MRI from last year. No new or worrisome findings.

## 2014-12-05 NOTE — Telephone Encounter (Signed)
LFt another vm for Cathy at [phone] about patients meds.

## 2014-12-05 NOTE — Telephone Encounter (Signed)
LFt vm for [name] on pts DPR list. Pt does have memory issues, was trying to give MRi results.

## 2014-12-08 NOTE — Telephone Encounter (Signed)
[facility] returned call, please call [phone] before 12noon.

## 2014-12-09 NOTE — Telephone Encounter (Signed)
Okay see note thanks

## 2014-12-09 NOTE — Telephone Encounter (Signed)
LFt vm for [place] Reusing pts cousin to give MRi results to. [name] is on patients DPR list. Pt has memory issues.

## 2014-12-09 NOTE — Telephone Encounter (Signed)
-----   Message from Garvin Fila, MD sent at 12/05/2014 12:00 PM EST ----- Kindly inform patient that MRI brain shows mild changes of hardening of arteries which are unchanged from prior MRI from last year. No new or worrisome findings.

## 2014-12-09 NOTE — Telephone Encounter (Signed)
Rn talk to [name] pts cousin on DPR form about that patients MRi brain shows changes of hardening of arteries which are unchanged from prior MRI. No new findings. Pt has major issues and his cousin will relay the message to pt [name]. PTs cousin verbalized understanding. [name] stated [name] is the patients psychiatric doctor and will be adjusting his medications.

## 2014-12-09 NOTE — Telephone Encounter (Signed)
[facility] returned [name]'s call

## 2014-12-09 NOTE — Telephone Encounter (Signed)
[name] called: Thank you Katrina for getting back with me. Just wanted Dr [name] to know about pt's current decline in memory. Per progress note that was faxed last week. No need to call back. Just want Dr [name] to have info for next OV.

## 2014-12-09 NOTE — Telephone Encounter (Signed)
LFt message for [name] about adjustment of patients medications.

## 2014-12-10 ENCOUNTER — Ambulatory Visit: Payer: Medicare Other | Admitting: Neurology

## 2014-12-22 DIAGNOSIS — F332 Major depressive disorder, recurrent severe without psychotic features: Secondary | ICD-10-CM | POA: Diagnosis not present

## 2014-12-24 ENCOUNTER — Other Ambulatory Visit: Payer: Medicare Other

## 2015-01-01 DIAGNOSIS — F332 Major depressive disorder, recurrent severe without psychotic features: Secondary | ICD-10-CM | POA: Diagnosis not present

## 2015-01-08 ENCOUNTER — Ambulatory Visit (INDEPENDENT_AMBULATORY_CARE_PROVIDER_SITE_OTHER): Payer: Medicare Other | Admitting: Neurology

## 2015-01-08 DIAGNOSIS — F0390 Unspecified dementia without behavioral disturbance: Secondary | ICD-10-CM

## 2015-01-08 NOTE — Procedures (Signed)
    History:  [name] is a 64 year old patient with a history of a progressive memory disorder. The patient has had a significant decline in his mental status, he is being evaluated for this.  This is a routine EEG. No skull defects are noted. Medications include Norvasc, Valium, doxepin, losartan, Mevacor, metoprolol, Prilosec, Paxil, Prazocin, Primidone, Cialis, and Valtrex.   EEG classification: Normal awake  Description of the recording: The background rhythms of this recording consists of a fairly well modulated medium amplitude alpha rhythm of 8 Hz that is reactive to eye opening and closure. As the record progresses, the patient appears to remain in the waking state throughout the recording. Photic stimulation was performed, resulting in a bilateral and symmetric photic driving response. Hyperventilation was also performed, resulting in a minimal buildup of the background rhythm activities without significant slowing seen. At no time during the recording does there appear to be evidence of spike or spike wave discharges or evidence of focal slowing. EKG monitor shows no evidence of cardiac rhythm abnormalities with a heart rate of 78.  Impression: This is a normal EEG recording in the waking state. No evidence of ictal or interictal discharges are seen.

## 2015-01-15 ENCOUNTER — Telehealth: Payer: Self-pay

## 2015-01-15 NOTE — Telephone Encounter (Signed)
Rn call patients cousin [name] on his DPR that pts EEG was normal. Pts cousin verbalized understanding.

## 2015-01-15 NOTE — Telephone Encounter (Signed)
-----   Message from Garvin Fila, MD sent at 01/09/2015  4:51 PM EST ----- Kindly inform the patient that EEG study was normal

## 2015-01-20 DIAGNOSIS — F332 Major depressive disorder, recurrent severe without psychotic features: Secondary | ICD-10-CM | POA: Diagnosis not present

## 2015-01-22 ENCOUNTER — Ambulatory Visit (INDEPENDENT_AMBULATORY_CARE_PROVIDER_SITE_OTHER): Payer: Medicare Other | Admitting: Neurology

## 2015-01-22 ENCOUNTER — Encounter: Payer: Self-pay | Admitting: Neurology

## 2015-01-22 ENCOUNTER — Ambulatory Visit: Payer: Medicare Other | Admitting: Neurology

## 2015-01-22 VITALS — BP 129/85 | HR 90 | Ht 66.0 in | Wt 195.2 lb

## 2015-01-22 DIAGNOSIS — R413 Other amnesia: Secondary | ICD-10-CM

## 2015-01-22 NOTE — Progress Notes (Signed)
PATIENT: [name] DOB: Mar 11, 1950  REASON FOR VISIT: follow up for essential tremor, history of stroke HISTORY FROM: patient  HISTORY OF PRESENT ILLNESS: 15 year African American male with upper extremity fine action tremors likely anxiety related. Benign essential tremor is possible though less likely. History of right brain subcortical infarct due to small vessel disease in December 2011 with no significant residual deficits. History of peripheral neuropathy diagnosis but no real clinical symptoms or signs on exam or on nerve conduction/EMG exam.. Vascular risk factors of hypertension and hyperlipidemia. New 50 llbs weightloss in 1 year without determined cause.  He returns for followup after his last visit on 12/08/11. He states his noticed some improvement in his tremors after starting the primidone which he takes 100 mg daily. He seems to be tolerating it well without any drowsiness, dryness of mouth or other side effects. He states payment on is helping him more than intra-and is wondering if he can come off the Inderal. He has no new complaints. He had lab work done on 12/08/11 which showed normal thyroid function tests and LFTs. He's had no new stroke or TIA symptoms. He remains on Plavix and is tolerating it well without side effects. He states his blood pressure is under good control and it is 131/80 today.   UPDATE 09/21/12 (LL): [name] returns for follow up today, last visit was on 03/21/12. He was increased on Primidone 125 mg last visit for tremor and was supposed to taper off Propranolol, but he states that he was not aware that he was to stop it. He states his tremor is about the same. He is going through a tough time personally, he is separated from his wife. He states that he was very depressed but is doing much better now. He continues to take daily Plavix with no excessive bleeding or bruising. HE states his blood pressure is usually well controlled but it is 145/97 in  office. He states he is to have his labs checked at his PCP in 6 weeks. He has no new complaints.   UPDATE 04/04/13 (LL):  [name] returns for follow up today, last visit was in September 2014.  He continues to take Primidone 125 mg daily for tremor.   He continues to take daily Plavix with no excessive bleeding or bruising. His last carotid doppler study in September was normal. BP today in office is 132/80, is better controlled with increase in BP medication.   He has notably gained almost 30 pounds since last visit, he explains he does not know why, not eating differently.  He has had changes though with his psychiatric meds though. Update 10/16/2013 : He returns for followup after his last visit 6 months ago. He continues to have mild tremors in his hands and is tolerating premature 125 mg at night fairly well without significant side effects. He states the tremors do not affect his routine or date and activities. She has been complaining of some paresthesias and discomfort involving his head mainly on the left side for 3-4 weeks this is present mainly in the morning but does improve as the day goes along. He denies headache or discomfort. Denies any sharp shooting pain or any vision loss. He has been complaining of some progressive memory difficulties. He does get lost easily and has to rely on his GPS. He forgets appointments and even really writes things down. He does have long-standing depression and does take medications for that. Update 02/18/14 :  He returns for follow-up after last visit 4 months ago. He reports slight worsening of his left hand tremor which at times now interferes with his activities of daily living during the day as well. He has not tried a higher dose of Mysoline be on 125 mg at bedtime yet but is willing to do so. He states his memory difficulties are unchanged. He remains compliant with his depression medications and sees a psychiatrist regularly. He had lab work done at last  visit on 10/16/13 which showed normal vitamin B12, TSH. RPR and HIV antibodies were negative. Carotid ultrasound done on 10/30/13 personally reviewed by me shows no significant extracranial stenosis. Update 11/14/2014 : He returns for follow-up after last visit 8 months ago. Is accompanied by a friend [name]. He has noticed significant worsening of his memory particularly short-term. This has been gradual over several years but more recently. The patient states that he has been regularly seeing a psychiatrist [name] on a monthly basis and he is on Paxil 40 mg a day. On Mini-Mental status testing today scored 21/30 which is significant decline from 28/30 at last visit. Patient however remains independent in activities of daily living. He lives alone. He states his hand tremors are quite minimal and not bothersome. He remains on primidone 125 mg twice daily. He denies any recent headaches, falls or head injuries. Prior to last visit he had vitamin B12, TSH and RPR tested which were normal. Update 01/22/2015 : He returns for follow-up after last visit to 2 months ago. Is accompanied by his cousin [name]. Patient has noticed improvement in his memory and cognition. He has been compliant with his depression medication which is taking without fail. Today scored 27/30 compared to 21/30 at last visit. He is independent in activities of daily living. He was seen by a psychiatrist's [name] who felt his depression was adequately treated and was concerned about his memory was actually getting worse. Patient himself actually denies this. He had EEG done on 01/08/15 which was normal which I personally reviewed and MRI scan of the brain on 12/04/14 which showed mild changes of chronic microvascular ischemia and atrophy which is unchanged compared with previous MRI. REVIEW OF SYSTEMS: Full 14 system review of systems performed and notable only for:  Leg swelling, foot allergies, memory loss, depression, sleep talking and  all other systems negative ALLERGIES: Allergies  Allergen Reactions  . Aspirin   . Lisinopril     critical  . Lithium     critical  . Oxycodone     HOME MEDICATIONS: Outpatient Prescriptions Prior to Visit  Medication Sig Dispense Refill  . aluminum chloride (DRYSOL) 20 % external solution Apply topically at bedtime.    Marland Kitchen amLODipine (NORVASC) 10 MG tablet Take 10 mg by mouth daily.    . diazepam (VALIUM) 2 MG tablet Take 2 mg by mouth as needed.    . doxepin (SINEQUAN) 75 MG capsule Take 75 mg by mouth.    Marland Kitchen LOSARTAN POTASSIUM PO Take 50 mg by mouth daily.     Marland Kitchen lovastatin (MEVACOR) 10 MG tablet Take 10 mg by mouth at bedtime.    . metoprolol succinate (TOPROL-XL) 25 MG 24 hr tablet Take 25 mg by mouth daily.    Marland Kitchen omeprazole (PRILOSEC) 20 MG capsule Take 20 mg by mouth daily.      Marland Kitchen PARoxetine (PAXIL) 40 MG tablet Take 40 mg by mouth daily.  1  . prazosin (MINIPRESS) 2 MG capsule Take 2 mg  by mouth at bedtime.    . primidone (MYSOLINE) 250 MG tablet Take 0.5 tablets (125 mg total) by mouth 2 (two) times daily. 90 tablet 3  . REXULTI 3 MG TABS Take 1 tablet by mouth every morning.  0  . tadalafil (CIALIS) 5 MG tablet Take 5 mg by mouth daily as needed for erectile dysfunction.    . valACYclovir (VALTREX) 500 MG tablet Take 500 mg by mouth daily.  11   No facility-administered medications prior to visit.     PHYSICAL EXAM  Filed Vitals:   01/22/15 1121  BP: 129/85  Pulse: 90  Height: 5\' 6"  (1.676 m)  Weight: 195 lb 3.2 oz (88.542 kg)   Body mass index is 31.52 kg/(m^2).  Generalized: Well developed middle aged african [place] male, in no acute distress  Head: normocephalic and atraumatic,   Neck: Supple, no carotid bruits  Cardiac: Regular rate rhythm, no murmur  Musculoskeletal: No deformity   Neurological examination  Mentation: Alert oriented to time, place, history taking. Follows all commands speech and language fluent . Mini-Mental status exam scored 27/30(  last visit21/30 )with 2 deficits in recall only.  . Clock drawing 4/4. Geriatric depression scale not done Cranial nerve II-XII: Pupils were equal round reactive to light extraocular movements were full, visual field were full on confrontational test. Facial sensation and strength were normal. hearing was intact to finger rubbing bilaterally. Uvula tongue midline. head turning and shoulder shrug and were normal and symmetric.Tongue protrusion into cheek strength was normal.  Motor: normal bulk and tone, full strength in the BUE, BLE, fine finger movements normal, no pronator drift. No focal weakness. Mild LUE action tremor noted while drawing spirals and joining 2 points to make a straight line Sensory: normal and symmetric to light touch, pinprick, and vibration  Coordination: finger-nose-finger, heel-to-shin bilaterally, no dysmetria  Gait and Station: Rising up from seated position without assistance, normal stance, without trunk ataxia, moderate stride, good arm swing, smooth turning, able to perform tiptoe, and heel walking without difficulty.  Reflexes: equal and symmetric  ASSESSMENT AND PLAN 36 year African American male with upper extremity fine action tremors likely anxiety related. Benign essential tremor is possible though less likely. History of right brain subcortical infarct due to small vessel disease in December 2011 with no significant residual deficits. History of peripheral neuropathy diagnosis but no real clinical symptoms or signs on exam or on nerve conduction/EMG exam. Vascular risk factors of hypertension and hyperlipidemia. Mild memory difficulties likely due to underlying  Suboptimally treated depression versus early dementia which appear to have progressed  PLAN:  I had a long discussion with the patient regarding his memory loss and mild cognitive impairment which appears to be stable and somewhat improved with the more optimal treatment of his depression. I do not recommend  any further diagnostic testing in the present time. He was counseled to be compliant with his depression medications. He was asked to return for follow-up in 6 months or call earlier if necessary.   Antony Contras, MD  01/22/2015, 1:14 PM [facility] [address] [place], [place] [address] [phone]  Note: This document was prepared with digital dictation and possible smart phrase technology. Any transcriptional errors that result from this process are unintentional.

## 2015-01-22 NOTE — Patient Instructions (Signed)
I had a long discussion with the patient regarding his memory loss and mild cognitive impairment which appears to be stable and somewhat improved with the more optimal treatment of his depression. I do not recommend any further diagnostic testing in the present time. He was counseled to be compliant with his depression medications. He was asked to return for follow-up in 6 months or call earlier if necessary.

## 2015-01-30 DIAGNOSIS — Z125 Encounter for screening for malignant neoplasm of prostate: Secondary | ICD-10-CM | POA: Diagnosis not present

## 2015-01-30 DIAGNOSIS — N401 Enlarged prostate with lower urinary tract symptoms: Secondary | ICD-10-CM | POA: Diagnosis not present

## 2015-01-30 DIAGNOSIS — R351 Nocturia: Secondary | ICD-10-CM | POA: Diagnosis not present

## 2015-01-30 DIAGNOSIS — Z0000 Encounter for general adult medical examination without abnormal findings: Secondary | ICD-10-CM | POA: Diagnosis not present

## 2015-01-30 DIAGNOSIS — N5201 Erectile dysfunction due to arterial insufficiency: Secondary | ICD-10-CM | POA: Diagnosis not present

## 2015-02-02 DIAGNOSIS — F332 Major depressive disorder, recurrent severe without psychotic features: Secondary | ICD-10-CM | POA: Diagnosis not present

## 2015-02-09 ENCOUNTER — Other Ambulatory Visit: Payer: Self-pay | Admitting: Urology

## 2015-02-10 DIAGNOSIS — R42 Dizziness and giddiness: Secondary | ICD-10-CM | POA: Diagnosis not present

## 2015-02-10 DIAGNOSIS — R51 Headache: Secondary | ICD-10-CM | POA: Diagnosis not present

## 2015-02-10 DIAGNOSIS — R110 Nausea: Secondary | ICD-10-CM | POA: Diagnosis not present

## 2015-02-10 DIAGNOSIS — K5909 Other constipation: Secondary | ICD-10-CM | POA: Diagnosis not present

## 2015-02-16 DIAGNOSIS — F332 Major depressive disorder, recurrent severe without psychotic features: Secondary | ICD-10-CM | POA: Diagnosis not present

## 2015-02-20 DIAGNOSIS — Z8619 Personal history of other infectious and parasitic diseases: Secondary | ICD-10-CM | POA: Diagnosis not present

## 2015-02-20 DIAGNOSIS — R51 Headache: Secondary | ICD-10-CM | POA: Diagnosis not present

## 2015-03-02 DIAGNOSIS — I10 Essential (primary) hypertension: Secondary | ICD-10-CM | POA: Diagnosis not present

## 2015-03-02 DIAGNOSIS — F332 Major depressive disorder, recurrent severe without psychotic features: Secondary | ICD-10-CM | POA: Diagnosis not present

## 2015-03-16 DIAGNOSIS — F332 Major depressive disorder, recurrent severe without psychotic features: Secondary | ICD-10-CM | POA: Diagnosis not present

## 2015-03-26 DIAGNOSIS — F902 Attention-deficit hyperactivity disorder, combined type: Secondary | ICD-10-CM | POA: Diagnosis not present

## 2015-03-26 DIAGNOSIS — F332 Major depressive disorder, recurrent severe without psychotic features: Secondary | ICD-10-CM | POA: Diagnosis not present

## 2015-04-02 DIAGNOSIS — F332 Major depressive disorder, recurrent severe without psychotic features: Secondary | ICD-10-CM | POA: Diagnosis not present

## 2015-04-13 DIAGNOSIS — F902 Attention-deficit hyperactivity disorder, combined type: Secondary | ICD-10-CM | POA: Diagnosis not present

## 2015-04-13 DIAGNOSIS — F332 Major depressive disorder, recurrent severe without psychotic features: Secondary | ICD-10-CM | POA: Diagnosis not present

## 2015-04-28 DIAGNOSIS — F902 Attention-deficit hyperactivity disorder, combined type: Secondary | ICD-10-CM | POA: Diagnosis not present

## 2015-04-28 DIAGNOSIS — F332 Major depressive disorder, recurrent severe without psychotic features: Secondary | ICD-10-CM | POA: Diagnosis not present

## 2015-05-06 ENCOUNTER — Other Ambulatory Visit: Payer: Self-pay | Admitting: Neurology

## 2015-05-18 DIAGNOSIS — F332 Major depressive disorder, recurrent severe without psychotic features: Secondary | ICD-10-CM | POA: Diagnosis not present

## 2015-05-29 DIAGNOSIS — L03019 Cellulitis of unspecified finger: Secondary | ICD-10-CM | POA: Diagnosis not present

## 2015-05-29 DIAGNOSIS — R609 Edema, unspecified: Secondary | ICD-10-CM | POA: Diagnosis not present

## 2015-06-29 DIAGNOSIS — M79642 Pain in left hand: Secondary | ICD-10-CM | POA: Diagnosis not present

## 2015-07-06 DIAGNOSIS — F332 Major depressive disorder, recurrent severe without psychotic features: Secondary | ICD-10-CM | POA: Diagnosis not present

## 2015-07-14 DIAGNOSIS — M79642 Pain in left hand: Secondary | ICD-10-CM | POA: Diagnosis not present

## 2015-07-16 DIAGNOSIS — F332 Major depressive disorder, recurrent severe without psychotic features: Secondary | ICD-10-CM | POA: Diagnosis not present

## 2015-07-17 DIAGNOSIS — S62515A Nondisplaced fracture of proximal phalanx of left thumb, initial encounter for closed fracture: Secondary | ICD-10-CM | POA: Insufficient documentation

## 2015-07-17 DIAGNOSIS — M19042 Primary osteoarthritis, left hand: Secondary | ICD-10-CM | POA: Diagnosis not present

## 2015-07-17 DIAGNOSIS — M1812 Unilateral primary osteoarthritis of first carpometacarpal joint, left hand: Secondary | ICD-10-CM | POA: Insufficient documentation

## 2015-07-17 DIAGNOSIS — R52 Pain, unspecified: Secondary | ICD-10-CM | POA: Insufficient documentation

## 2015-07-22 DIAGNOSIS — M79645 Pain in left finger(s): Secondary | ICD-10-CM | POA: Diagnosis not present

## 2015-07-22 DIAGNOSIS — F332 Major depressive disorder, recurrent severe without psychotic features: Secondary | ICD-10-CM | POA: Diagnosis not present

## 2015-07-23 ENCOUNTER — Ambulatory Visit: Payer: Medicare Other | Admitting: Neurology

## 2015-07-27 ENCOUNTER — Encounter: Payer: Self-pay | Admitting: Neurology

## 2015-08-02 ENCOUNTER — Other Ambulatory Visit: Payer: Self-pay | Admitting: Neurology

## 2015-08-04 DIAGNOSIS — F902 Attention-deficit hyperactivity disorder, combined type: Secondary | ICD-10-CM | POA: Diagnosis not present

## 2015-08-04 DIAGNOSIS — F332 Major depressive disorder, recurrent severe without psychotic features: Secondary | ICD-10-CM | POA: Diagnosis not present

## 2015-08-14 DIAGNOSIS — M19042 Primary osteoarthritis, left hand: Secondary | ICD-10-CM | POA: Diagnosis not present

## 2015-08-14 DIAGNOSIS — M1812 Unilateral primary osteoarthritis of first carpometacarpal joint, left hand: Secondary | ICD-10-CM | POA: Diagnosis not present

## 2015-08-14 DIAGNOSIS — S62515A Nondisplaced fracture of proximal phalanx of left thumb, initial encounter for closed fracture: Secondary | ICD-10-CM | POA: Diagnosis not present

## 2015-08-21 ENCOUNTER — Ambulatory Visit (INDEPENDENT_AMBULATORY_CARE_PROVIDER_SITE_OTHER): Payer: Medicare Other | Admitting: Neurology

## 2015-08-21 ENCOUNTER — Encounter: Payer: Self-pay | Admitting: Neurology

## 2015-08-21 VITALS — BP 134/83 | HR 71 | Ht 67.0 in | Wt 199.2 lb

## 2015-08-21 DIAGNOSIS — G250 Essential tremor: Secondary | ICD-10-CM

## 2015-08-21 DIAGNOSIS — N401 Enlarged prostate with lower urinary tract symptoms: Secondary | ICD-10-CM | POA: Diagnosis not present

## 2015-08-21 DIAGNOSIS — R351 Nocturia: Secondary | ICD-10-CM | POA: Diagnosis not present

## 2015-08-21 DIAGNOSIS — N5201 Erectile dysfunction due to arterial insufficiency: Secondary | ICD-10-CM | POA: Diagnosis not present

## 2015-08-21 MED ORDER — PRIMIDONE 250 MG PO TABS: 250.0000 mg | ORAL_TABLET | Freq: Two times a day (BID) | ORAL | 2 refills | Status: DC

## 2015-08-21 NOTE — Progress Notes (Signed)
PATIENT: [name] DOB: Mar 11, 1950  REASON FOR VISIT: follow up for essential tremor, history of stroke HISTORY FROM: patient  HISTORY OF PRESENT ILLNESS: 15 year African American male with upper extremity fine action tremors likely anxiety related. Benign essential tremor is possible though less likely. History of right brain subcortical infarct due to small vessel disease in December 2011 with no significant residual deficits. History of peripheral neuropathy diagnosis but no real clinical symptoms or signs on exam or on nerve conduction/EMG exam.. Vascular risk factors of hypertension and hyperlipidemia. New 50 llbs weightloss in 1 year without determined cause.  He returns for followup after his last visit on 12/08/11. He states his noticed some improvement in his tremors after starting the primidone which he takes 100 mg daily. He seems to be tolerating it well without any drowsiness, dryness of mouth or other side effects. He states payment on is helping him more than intra-and is wondering if he can come off the Inderal. He has no new complaints. He had lab work done on 12/08/11 which showed normal thyroid function tests and LFTs. He's had no new stroke or TIA symptoms. He remains on Plavix and is tolerating it well without side effects. He states his blood pressure is under good control and it is 131/80 today.   UPDATE 09/21/12 (LL): [name] returns for follow up today, last visit was on 03/21/12. He was increased on Primidone 125 mg last visit for tremor and was supposed to taper off Propranolol, but he states that he was not aware that he was to stop it. He states his tremor is about the same. He is going through a tough time personally, he is separated from his wife. He states that he was very depressed but is doing much better now. He continues to take daily Plavix with no excessive bleeding or bruising. HE states his blood pressure is usually well controlled but it is 145/97 in  office. He states he is to have his labs checked at his PCP in 6 weeks. He has no new complaints.   UPDATE 04/04/13 (LL):  [name] returns for follow up today, last visit was in September 2014.  He continues to take Primidone 125 mg daily for tremor.   He continues to take daily Plavix with no excessive bleeding or bruising. His last carotid doppler study in September was normal. BP today in office is 132/80, is better controlled with increase in BP medication.   He has notably gained almost 30 pounds since last visit, he explains he does not know why, not eating differently.  He has had changes though with his psychiatric meds though. Update 10/16/2013 : He returns for followup after his last visit 6 months ago. He continues to have mild tremors in his hands and is tolerating premature 125 mg at night fairly well without significant side effects. He states the tremors do not affect his routine or date and activities. She has been complaining of some paresthesias and discomfort involving his head mainly on the left side for 3-4 weeks this is present mainly in the morning but does improve as the day goes along. He denies headache or discomfort. Denies any sharp shooting pain or any vision loss. He has been complaining of some progressive memory difficulties. He does get lost easily and has to rely on his GPS. He forgets appointments and even really writes things down. He does have long-standing depression and does take medications for that. Update 02/18/14 :  He returns for follow-up after last visit 4 months ago. He reports slight worsening of his left hand tremor which at times now interferes with his activities of daily living during the day as well. He has not tried a higher dose of Mysoline be on 125 mg at bedtime yet but is willing to do so. He states his memory difficulties are unchanged. He remains compliant with his depression medications and sees a psychiatrist regularly. He had lab work done at last  visit on 10/16/13 which showed normal vitamin B12, TSH. RPR and HIV antibodies were negative. Carotid ultrasound done on 10/30/13 personally reviewed by me shows no significant extracranial stenosis. Update 11/14/2014 : He returns for follow-up after last visit 8 months ago. Is accompanied by a friend [name]. He has noticed significant worsening of his memory particularly short-term. This has been gradual over several years but more recently. The patient states that he has been regularly seeing a psychiatrist [name] on a monthly basis and he is on Paxil 40 mg a day. On Mini-Mental status testing today scored 21/30 which is significant decline from 28/30 at last visit. Patient however remains independent in activities of daily living. He lives alone. He states his hand tremors are quite minimal and not bothersome. He remains on primidone 125 mg twice daily. He denies any recent headaches, falls or head injuries. Prior to last visit he had vitamin B12, TSH and RPR tested which were normal. Update 01/22/2015 : He returns for follow-up after last visit to 2 months ago. Is accompanied by his cousin [name]. Patient has noticed improvement in his memory and cognition. He has been compliant with his depression medication which is taking without fail. Today scored 27/30 compared to 21/30 at last visit. He is independent in activities of daily living. He was seen by a psychiatrist's [name] who felt his depression was adequately treated and was concerned about his memory was actually getting worse. Patient himself actually denies this. He had EEG done on 01/08/15 which was normal which I personally reviewed and MRI scan of the brain on 12/04/14 which showed mild changes of chronic microvascular ischemia and atrophy which is unchanged compared with previous MRI. Update 08/21/2015 : He returns for follow-up after last visit 7 months ago. He states his noticed worsening of his tremors. They involve mostly his hands but  also at times his feet. He has trouble doing activities like holding a cup or newspaper. He can get some concentration. His tremors. He has been taking Mysoline 125 mg twice daily for his long-standing essential tremor but feels he needs more medicine now. Patient was in fact stopped prior complicated driving and found to be asleep. He was arrested and currently has been on aspirin junction to limit his driving to daytime hours only. Patient states his memory difficulties remain unchanged and are not progressive. He feels his depression is adequately treated but on geriatric depression scale today scored 8 suggestive of mild depression. On Almond he scored23/30 with deficits in recall and orientation. REVIEW OF SYSTEMS: Full 14 system review of systems performed and notable only for:   Tremors, dizziness, daytime sleepiness, memory loss, depression,  and all other systems negative ALLERGIES: Allergies  Allergen Reactions  . Aspirin   . Lisinopril     critical  . Lithium     critical  . Oxycodone     HOME MEDICATIONS: Outpatient Medications Prior to Visit  Medication Sig Dispense Refill  . amLODipine (NORVASC) 10 MG tablet Take  10 mg by mouth daily.    . diazepam (VALIUM) 2 MG tablet Take 2 mg by mouth as needed.    Marland Kitchen losartan (COZAAR) 100 MG tablet     . lovastatin (MEVACOR) 10 MG tablet Take 10 mg by mouth at bedtime.    . metoprolol succinate (TOPROL-XL) 25 MG 24 hr tablet Take 25 mg by mouth daily.    Marland Kitchen PARoxetine (PAXIL) 40 MG tablet Take 40 mg by mouth daily.  1  . prazosin (MINIPRESS) 2 MG capsule Take 2 mg by mouth at bedtime.    Marland Kitchen REXULTI 3 MG TABS Take 1 tablet by mouth every morning.  0  . LOSARTAN POTASSIUM PO Take 50 mg by mouth daily.     . primidone (MYSOLINE) 250 MG tablet TAKE 0.5 TABLETS (125 MG TOTAL) BY MOUTH 2 (TWO) TIMES DAILY. 90 tablet 0  . valACYclovir (VALTREX) 500 MG tablet Take 500 mg by mouth daily.  11  . aluminum chloride (DRYSOL) 20 % external solution Apply  topically at bedtime.    Marland Kitchen doxepin (SINEQUAN) 75 MG capsule Take 75 mg by mouth.    Marland Kitchen omeprazole (PRILOSEC) 20 MG capsule Take 20 mg by mouth daily.      . tadalafil (CIALIS) 5 MG tablet Take 5 mg by mouth daily as needed for erectile dysfunction.     No facility-administered medications prior to visit.      PHYSICAL EXAM  Vitals:   08/21/15 1026  BP: 134/83  BP Location: Right Arm  Patient Position: Sitting  Cuff Size: Normal  Pulse: 71  Weight: 199 lb 3.2 oz (90.4 kg)  Height: 5\' 7"  (1.702 m)   Body mass index is 31.2 kg/m.  Generalized: Well developed middle aged african [place] male, in no acute distress  Head: normocephalic and atraumatic,   Neck: Supple, no carotid bruits  Cardiac: Regular rate rhythm, no murmur  Musculoskeletal: No deformity   Neurological examination  Mentation: Alert oriented to time, place, history taking. Follows all commands speech and language fluent .MOCA scored 23/30  Animal Naming 10.  Marland Kitchen Clock drawing 4/4. Geriatric depression scale 8 s/o mild depression Cranial nerve II-XII: Pupils were equal round reactive to light extraocular movements were full, visual field were full on confrontational test. Facial sensation and strength were normal. hearing was intact to finger rubbing bilaterally. Uvula tongue midline. head turning and shoulder shrug and were normal and symmetric.Tongue protrusion into cheek strength was normal.  Motor: normal bulk and tone, full strength in the BUE, BLE, fine finger movements normal, no pronator drift. No focal weakness. Mild LUE action tremor noted while drawing spirals and joining 2 points to make a straight line Sensory: normal and symmetric to light touch, pinprick, and vibration  Coordination: finger-nose-finger, heel-to-shin bilaterally, no dysmetria  Gait and Station: Rising up from seated position without assistance, normal stance, without trunk ataxia, moderate stride, good arm swing, smooth turning, able to  perform tiptoe, and heel walking without difficulty.  Reflexes: equal and symmetric  ASSESSMENT AND PLAN 10 year African American male with upper extremity fine action tremors likely anxiety related. Benign essential tremor is possible though less likely. History of right brain subcortical infarct due to small vessel disease in December 2011 with no significant residual deficits. History of peripheral neuropathy diagnosis but no real clinical symptoms or signs on exam or on nerve conduction/EMG exam. Vascular risk factors of hypertension and hyperlipidemia. Mild memory difficulties likely due to underlying  Suboptimally treated depression versus early dementia  which appear to have progressed  PLAN:  I had a long discussion with the patient regarding his memory loss and mild cognitive impairment which appears to be stable and somewhat improved with the more optimal treatment of his depression.   He was counseled to be compliant with his depression medications. He is complaining of increasing tremors which are interfering with his activities of daily living hence recommend increase dose of primidone to 125 mg the morning and 250 mg at night for 1 week followed by 250 mg twice daily if tolerated. Have counseled him to be careful and not to drive if he is feeling sleepy .He was asked to return for follow-up in 6 months or call earlier if necessary   Antony Contras, MD  08/21/2015, 11:05 AM [facility] [address] [place], [place] [address] [phone]  Note: This document was prepared with digital dictation and possible smart phrase technology. Any transcriptional errors that result from this process are unintentional.

## 2015-08-21 NOTE — Patient Instructions (Addendum)
I had a long discussion with the patient regarding his memory loss and mild cognitive impairment which appears to be stable and somewhat improved with the more optimal treatment of his depression.   He was counseled to be compliant with his depression medications. He is complaining of increasing tremors which are interfering with his activities of daily living hence recommend increase dose of primidone to 125 mg the morning and 250 mg at night for 1 week followed by 250 mg twice daily if tolerated. Have counseled him to be careful and not to drive if he is feeling sleepy .He was asked to return for follow-up in 6 months or call earlier if necessary.

## 2015-08-24 DIAGNOSIS — F902 Attention-deficit hyperactivity disorder, combined type: Secondary | ICD-10-CM | POA: Diagnosis not present

## 2015-08-24 DIAGNOSIS — F332 Major depressive disorder, recurrent severe without psychotic features: Secondary | ICD-10-CM | POA: Diagnosis not present

## 2015-08-28 DIAGNOSIS — M25332 Other instability, left wrist: Secondary | ICD-10-CM | POA: Insufficient documentation

## 2015-08-28 DIAGNOSIS — S62515D Nondisplaced fracture of proximal phalanx of left thumb, subsequent encounter for fracture with routine healing: Secondary | ICD-10-CM | POA: Diagnosis not present

## 2015-08-28 DIAGNOSIS — M79642 Pain in left hand: Secondary | ICD-10-CM | POA: Diagnosis not present

## 2015-08-29 ENCOUNTER — Other Ambulatory Visit: Payer: Self-pay | Admitting: Orthopedic Surgery

## 2015-08-29 DIAGNOSIS — M25532 Pain in left wrist: Secondary | ICD-10-CM

## 2015-09-07 DIAGNOSIS — F902 Attention-deficit hyperactivity disorder, combined type: Secondary | ICD-10-CM | POA: Diagnosis not present

## 2015-09-07 DIAGNOSIS — F332 Major depressive disorder, recurrent severe without psychotic features: Secondary | ICD-10-CM | POA: Diagnosis not present

## 2015-09-10 DIAGNOSIS — F332 Major depressive disorder, recurrent severe without psychotic features: Secondary | ICD-10-CM | POA: Diagnosis not present

## 2015-09-14 ENCOUNTER — Ambulatory Visit
Admission: RE | Admit: 2015-09-14 | Discharge: 2015-09-14 | Disposition: A | Discharge status: Home / Self Care | Payer: Medicare Other | Source: Ambulatory Visit | Attending: Orthopedic Surgery | Admitting: Orthopedic Surgery

## 2015-09-14 DIAGNOSIS — M25532 Pain in left wrist: Secondary | ICD-10-CM | POA: Diagnosis not present

## 2015-09-14 DIAGNOSIS — M19032 Primary osteoarthritis, left wrist: Secondary | ICD-10-CM | POA: Diagnosis not present

## 2015-09-14 MED ORDER — IOPAMIDOL (ISOVUE-M 200) INJECTION 41%
1.5000 mL | Freq: Once | INTRAMUSCULAR | Status: AC
  Administered 2015-09-14: 1.5 mL via INTRA_ARTICULAR

## 2015-09-16 DIAGNOSIS — M25332 Other instability, left wrist: Secondary | ICD-10-CM | POA: Diagnosis not present

## 2015-09-17 DIAGNOSIS — F902 Attention-deficit hyperactivity disorder, combined type: Secondary | ICD-10-CM | POA: Diagnosis not present

## 2015-09-17 DIAGNOSIS — F332 Major depressive disorder, recurrent severe without psychotic features: Secondary | ICD-10-CM | POA: Diagnosis not present

## 2015-09-30 DIAGNOSIS — F332 Major depressive disorder, recurrent severe without psychotic features: Secondary | ICD-10-CM | POA: Diagnosis not present

## 2015-09-30 DIAGNOSIS — F902 Attention-deficit hyperactivity disorder, combined type: Secondary | ICD-10-CM | POA: Diagnosis not present

## 2015-10-19 DIAGNOSIS — S62515D Nondisplaced fracture of proximal phalanx of left thumb, subsequent encounter for fracture with routine healing: Secondary | ICD-10-CM | POA: Diagnosis not present

## 2015-10-19 DIAGNOSIS — M25332 Other instability, left wrist: Secondary | ICD-10-CM | POA: Diagnosis not present

## 2015-10-19 DIAGNOSIS — M79642 Pain in left hand: Secondary | ICD-10-CM | POA: Diagnosis not present

## 2015-10-22 DIAGNOSIS — F902 Attention-deficit hyperactivity disorder, combined type: Secondary | ICD-10-CM | POA: Diagnosis not present

## 2015-10-22 DIAGNOSIS — F332 Major depressive disorder, recurrent severe without psychotic features: Secondary | ICD-10-CM | POA: Diagnosis not present

## 2015-11-11 ENCOUNTER — Encounter: Payer: Self-pay | Admitting: Nurse Practitioner

## 2015-11-11 ENCOUNTER — Ambulatory Visit (INDEPENDENT_AMBULATORY_CARE_PROVIDER_SITE_OTHER): Payer: Medicare Other | Admitting: Nurse Practitioner

## 2015-11-11 VITALS — BP 123/82 | HR 74 | Ht 67.0 in | Wt 196.0 lb

## 2015-11-11 DIAGNOSIS — G250 Essential tremor: Secondary | ICD-10-CM | POA: Diagnosis not present

## 2015-11-11 DIAGNOSIS — I10 Essential (primary) hypertension: Secondary | ICD-10-CM | POA: Diagnosis not present

## 2015-11-11 DIAGNOSIS — Z8673 Personal history of transient ischemic attack (TIA), and cerebral infarction without residual deficits: Secondary | ICD-10-CM | POA: Diagnosis not present

## 2015-11-11 DIAGNOSIS — R413 Other amnesia: Secondary | ICD-10-CM

## 2015-11-11 DIAGNOSIS — F332 Major depressive disorder, recurrent severe without psychotic features: Secondary | ICD-10-CM | POA: Diagnosis not present

## 2015-11-11 DIAGNOSIS — E785 Hyperlipidemia, unspecified: Secondary | ICD-10-CM | POA: Diagnosis not present

## 2015-11-11 DIAGNOSIS — R251 Tremor, unspecified: Secondary | ICD-10-CM

## 2015-11-11 MED ORDER — PRIMIDONE 250 MG PO TABS: 250.0000 mg | ORAL_TABLET | Freq: Two times a day (BID) | ORAL | 8 refills | Status: DC

## 2015-11-11 MED ORDER — CLOPIDOGREL BISULFATE 75 MG PO TABS: 75.0000 mg | ORAL_TABLET | Freq: Every day | ORAL | 11 refills | Status: DC

## 2015-11-11 NOTE — Progress Notes (Signed)
[facility]  PATIENT: [name] DOB: 1950/03/26   REASON FOR VISIT: Follow-up for essential tremor, memory loss, history of stroke HISTORY FROM: Patient    HISTORY OF PRESENT ILLNESS:65 year African American male with upper extremity fine action tremors likely anxiety related. Benign essential tremor is possible though less likely. History of right brain subcortical infarct due to small vessel disease in December 2011 with no significant residual deficits. History of peripheral neuropathy diagnosis but no real clinical symptoms or signs on exam or on nerve conduction/EMG exam.. Vascular risk factors of hypertension and hyperlipidemia. New 50 llbs weightloss in 1 year without determined cause.  He returns for followup after his last visit on 12/08/11. He states his noticed some improvement in his tremors after starting the primidone which he takes 100 mg daily. He seems to be tolerating it well without any drowsiness, dryness of mouth or other side effects. He states payment on is helping him more than intra-and is wondering if he can come off the Inderal. He has no new complaints. He had lab work done on 12/08/11 which showed normal thyroid function tests and LFTs. He's had no new stroke or TIA symptoms. He remains on Plavix and is tolerating it well without side effects. He states his blood pressure is under good control and it is 131/80 today.   UPDATE 09/21/12 (LL): [name] returns for follow up today, last visit was on 03/21/12. He was increased on Primidone 125 mg last visit for tremor and was supposed to taper off Propranolol, but he states that he was not aware that he was to stop it. He states his tremor is about the same. He is going through a tough time personally, he is separated from his wife. He states that he was very depressed but is doing much better now. He continues to take daily Plavix with no excessive bleeding or bruising. HE states his blood  pressure is usually well controlled but it is 145/97 in office. He states he is to have his labs checked at his PCP in 6 weeks. He has no new complaints.   UPDATE 04/04/13 (LL):  [name] returns for follow up today, last visit was in September 2014.  He continues to take Primidone 125 mg daily for tremor.   He continues to take daily Plavix with no excessive bleeding or bruising. His last carotid doppler study in September was normal. BP today in office is 132/80, is better controlled with increase in BP medication.   He has notably gained almost 30 pounds since last visit, he explains he does not know why, not eating differently.  He has had changes though with his psychiatric meds though. Update 10/16/2013 : He returns for followup after his last visit 6 months ago. He continues to have mild tremors in his hands and is tolerating premature 125 mg at night fairly well without significant side effects. He states the tremors do not affect his routine or date and activities. She has been complaining of some paresthesias and discomfort involving his head mainly on the left side for 3-4 weeks this is present mainly in the morning but does improve as the day goes along. He denies headache or discomfort. Denies any sharp shooting pain or any vision loss. He has been complaining of some progressive memory difficulties. He does get lost easily and has to rely on his GPS. He forgets appointments and even really writes things down. He does have long-standing depression and does take  medications for that. Update 02/18/14 : He returns for follow-up after last visit 4 months ago. He reports slight worsening of his left hand tremor which at times now interferes with his activities of daily living during the day as well. He has not tried a higher dose of Mysoline be on 125 mg at bedtime yet but is willing to do so. He states his memory difficulties are unchanged. He remains compliant with his depression medications and sees a  psychiatrist regularly. He had lab work done at last visit on 10/16/13 which showed normal vitamin B12, TSH. RPR and HIV antibodies were negative. Carotid ultrasound done on 10/30/13 personally reviewed by me shows no significant extracranial stenosis. Update 11/14/2014 : He returns for follow-up after last visit 8 months ago. Is accompanied by a friend [name]. He has noticed significant worsening of his memory particularly short-term. This has been gradual over several years but more recently. The patient states that he has been regularly seeing a psychiatrist [name] on a monthly basis and he is on Paxil 40 mg a day. On Mini-Mental status testing today scored 21/30 which is significant decline from 28/30 at last visit. Patient however remains independent in activities of daily living. He lives alone. He states his hand tremors are quite minimal and not bothersome. He remains on primidone 125 mg twice daily. He denies any recent headaches, falls or head injuries. Prior to last visit he had vitamin B12, TSH and RPR tested which were normal. Update 01/22/2015 : He returns for follow-up after last visit to 2 months ago. Is accompanied by his cousin [name]. Patient has noticed improvement in his memory and cognition. He has been compliant with his depression medication which is taking without fail. Today scored 27/30 compared to 21/30 at last visit. He is independent in activities of daily living. He was seen by a psychiatrist's [name] who felt his depression was adequately treated and was concerned about his memory was actually getting worse. Patient himself actually denies this. He had EEG done on 01/08/15 which was normal which I personally reviewed and MRI scan of the brain on 12/04/14 which showed mild changes of chronic microvascular ischemia and atrophy which is unchanged compared with previous MRI. Update 8/4/2017PS : He returns for follow-up after last visit 7 months ago. He states his noticed  worsening of his tremors. They involve mostly his hands but also at times his feet. He has trouble doing activities like holding a cup or newspaper. He can get some concentration. His tremors. He has been taking Mysoline 125 mg twice daily for his long-standing essential tremor but feels he needs more medicine now.  Patient states his memory difficulties remain unchanged and are not progressive. He feels his depression is adequately treated but on geriatric depression scale today scored 8 suggestive of mild depression. On Edmonson he scored23/30 with deficits in recall and orientation.  UPDATE 10/25/2017CM Mr. Naula 65 year old male returns for follow-up. He has a history of stroke event in 2011 and was on Plavix until about a year ago. When asked if he can take aspirin he claims he had nausea and vomiting balance issues and dizziness from aspirin. He also has a history of essential tremor and is currently on Mysoline 250 at night he is not taking the morning dose and  his tremor is worse during the day he claims however I see very little tremor on exam. His memory difficulties are stable and his MOCA was not repeated today. He returns for  reevaluation previous records reviewed REVIEW OF SYSTEMS: Full 14 system review of systems performed and notable only for those listed, all others are neg:  Constitutional: neg  Cardiovascular: neg Ear/Nose/Throat: neg  Skin: neg Eyes: neg Respiratory: neg Gastroitestinal: neg  Hematology/Lymphatic: neg  Endocrine: neg Musculoskeletal:neg Allergy/Immunology: neg Neurological: Tremors, history of stroke Psychiatric: neg Sleep : neg   ALLERGIES: Allergies  Allergen Reactions  . Aspirin   . Lisinopril     critical  . Lithium     critical  . Oxycodone     HOME MEDICATIONS: Outpatient Medications Prior to Visit  Medication Sig Dispense Refill  . amLODipine (NORVASC) 10 MG tablet Take 10 mg by mouth daily.    . Armodafinil 150 MG tablet Take 150 mg by  mouth daily.    . diazepam (VALIUM) 2 MG tablet Take 2 mg by mouth as needed.    Marland Kitchen losartan (COZAAR) 100 MG tablet     . lovastatin (MEVACOR) 10 MG tablet Take 10 mg by mouth at bedtime.    . metoprolol succinate (TOPROL-XL) 25 MG 24 hr tablet Take 25 mg by mouth daily.    Marland Kitchen PARoxetine (PAXIL) 40 MG tablet Take 40 mg by mouth daily.  1  . prazosin (MINIPRESS) 2 MG capsule Take 2 mg by mouth at bedtime.    . primidone (MYSOLINE) 250 MG tablet Take 1 tablet (250 mg total) by mouth 2 (two) times daily. 90 tablet 2  . REXULTI 3 MG TABS Take 1 tablet by mouth every morning.  0  . valACYclovir (VALTREX) 500 MG tablet Take 500 mg by mouth daily.  11   No facility-administered medications prior to visit.     PAST MEDICAL HISTORY: Past Medical History:  Diagnosis Date  . Depression   . Frequent urination   . GERD (gastroesophageal reflux disease)   . Herpes   . Hyperlipidemia   . Hypertension   . Memory loss   . Obesity   . Stroke ([place])   . Urgency of urination     PAST SURGICAL HISTORY: Past Surgical History:  Procedure Laterality Date  . disckec    . SPINAL FUSION    . VASECTOMY      FAMILY HISTORY: Family History  Problem Relation Age of Onset  . Hypertension Mother   . Hypertension Father   . Hypertension Sister   . Hypertension Brother   . Cancer Maternal Aunt   . Cancer - Ovarian Maternal Grandmother   . Diabetes Mellitus I Paternal Grandmother   . Hypertension Paternal Grandfather   . Cancer Maternal Aunt     SOCIAL HISTORY: Social History   Social History  . Marital status: Legally Separated    Spouse name: N/A  . Number of children: 1  . Years of education: Masters   Occupational History  . retired Retired   Social History Main Topics  . Smoking status: Former Research scientist (life sciences)  . Smokeless tobacco: Never Used  . Alcohol use 0.6 oz/week    1 Cans of beer per week     Comment: socially  . Drug use: No  . Sexual activity: No   Other Topics Concern  . Not on  file   Social History Narrative   Patient is separated.   Patient lives alone and has one child.   Patient is retired.   Patient is right-handed.   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   Patient does not drink any caffeine.     PHYSICAL EXAM  Vitals:  11/11/15 1520  BP: 123/82  Pulse: 74  Weight: 196 lb (88.9 kg)  Height: 5\' 7"  (1.702 m)   Body mass index is 30.7 kg/m. Generalized: Well developed middle aged african [place] male, in no acute distress  Head: normocephalic and atraumatic,   Neck: Supple, no carotid bruits  Cardiac: Regular rate rhythm, no murmur  Musculoskeletal: No deformity   Neurological examination  Mentation: Alert oriented to time, place, history taking. Follows all commands speech and language fluent . Cranial nerve II-XII: Pupils were equal round reactive to light extraocular movements were full, visual field were full on confrontational test. Facial sensation and strength were normal. hearing was intact to finger rubbing bilaterally. Uvula tongue midline. head turning and shoulder shrug and were normal and symmetric.Tongue protrusion into cheek strength was normal.  Motor: normal bulk and tone, full strength in the BUE, BLE, fine finger movements normal, no pronator drift. No focal weakness. Mild RUE action tremor noted no cogwheeling no bradykinesia Sensory: normal and symmetric to light touch, pinprick, and vibration in the upper and lower extremities Coordination: finger-nose-finger, heel-to-shin bilaterally, no dysmetria  Gait and Station: Rising up from seated position without assistance, normal stance, without trunk ataxia, moderate stride, good arm swing, smooth turning, able to perform tiptoe, and heel walking without difficulty.  Reflexes: equal and symmetric   DIAGNOSTIC DATA (LABS, IMAGING, TESTING) -     ASSESSMENT AND PLAN 22 year African American male with upper extremity fine action tremors likely anxiety related. Benign essential  tremor is possible though less likely. History of right brain subcortical infarct due to small vessel disease in December 2011 with no significant residual deficits. History of peripheral neuropathy diagnosis but no real clinical symptoms or signs on exam or on nerve conduction/EMG exam. Vascular risk factors of hypertension and hyperlipidemia. Mild memory difficulties likely due to underlying   depression versus early dementia.  PLAN:Restart Plavix 75 mg daily Continue Mysoline 250 twice daily for tremor may take Doses during the day Blood pressure in good control 123/82 continue blood pressure medications Continue lovastatin for lipid management Continue follow-up with Dr. Lin Landsman for psychiatric issues and depression Follow-up in 6-8 months will repeat memory testing at that time Dennie Bible, [facility], [facility], [facility] [address], [place], [place] [address] [phone]

## 2015-11-11 NOTE — Patient Instructions (Addendum)
Restart Plavix 75 mg daily Continue Mysoline 250 twice daily for tremor may take Doses during the day Blood pressure in good control 123/82 continue blood pressure medications Continue lovastatin for lipid management Follow-up in 6-8 months will repeat memory testing at that time

## 2015-11-12 DIAGNOSIS — F332 Major depressive disorder, recurrent severe without psychotic features: Secondary | ICD-10-CM | POA: Diagnosis not present

## 2015-11-12 DIAGNOSIS — F902 Attention-deficit hyperactivity disorder, combined type: Secondary | ICD-10-CM | POA: Diagnosis not present

## 2015-11-12 NOTE — Progress Notes (Signed)
I agree with the above plan 

## 2015-11-13 DIAGNOSIS — M79606 Pain in leg, unspecified: Secondary | ICD-10-CM | POA: Diagnosis not present

## 2015-11-24 DIAGNOSIS — F902 Attention-deficit hyperactivity disorder, combined type: Secondary | ICD-10-CM | POA: Diagnosis not present

## 2015-11-24 DIAGNOSIS — F332 Major depressive disorder, recurrent severe without psychotic features: Secondary | ICD-10-CM | POA: Diagnosis not present

## 2015-11-30 DIAGNOSIS — M25332 Other instability, left wrist: Secondary | ICD-10-CM | POA: Diagnosis not present

## 2015-12-03 ENCOUNTER — Encounter: Payer: Self-pay | Admitting: Nurse Practitioner

## 2015-12-03 ENCOUNTER — Ambulatory Visit (INDEPENDENT_AMBULATORY_CARE_PROVIDER_SITE_OTHER): Payer: Medicare Other | Admitting: Nurse Practitioner

## 2015-12-03 VITALS — BP 127/77 | HR 62 | Ht 67.0 in | Wt 194.4 lb

## 2015-12-03 DIAGNOSIS — R251 Tremor, unspecified: Secondary | ICD-10-CM

## 2015-12-03 DIAGNOSIS — Z8673 Personal history of transient ischemic attack (TIA), and cerebral infarction without residual deficits: Secondary | ICD-10-CM

## 2015-12-03 MED ORDER — CARBIDOPA-LEVODOPA 25-100 MG PO TABS: 1.0000 | ORAL_TABLET | Freq: Two times a day (BID) | ORAL | 4 refills | Status: DC

## 2015-12-03 NOTE — Progress Notes (Signed)
[facility]  PATIENT: [name] DOB: [birth year]   REASON FOR VISIT: Follow-up for  tremor, memory loss, history of stroke HISTORY FROM: Patient  HISTORY OF PRESENT ILLNESS:61 year African American male with upper extremity fine action tremors likely anxiety related. Benign essential tremor is possible though less likely. History of right brain subcortical infarct due to small vessel disease in December 2011 with no significant residual deficits. History of peripheral neuropathy diagnosis but no real clinical symptoms or signs on exam or on nerve conduction/EMG exam.. Vascular risk factors of hypertension and hyperlipidemia. New 50 llbs weightloss in 1 year without determined cause.  He returns for followup after his last visit on 12/08/11. He states his noticed some improvement in his tremors after starting the primidone which he takes 100 mg daily. He seems to be tolerating it well without any drowsiness, dryness of mouth or other side effects. He states payment on is helping him more than intra-and is wondering if he can come off the Inderal. He has no new complaints. He had lab work done on 12/08/11 which showed normal thyroid function tests and LFTs. He's had no new stroke or TIA symptoms. He remains on Plavix and is tolerating it well without side effects. He states his blood pressure is under good control and it is 131/80 today.   Update 10/16/2013 : He returns for followup after his last visit 6 months ago. He continues to have mild tremors in his hands and is tolerating primadone 125 mg at night fairly well without significant side effects. He states the tremors do not affect his routine or date and activities. She has been complaining of some paresthesias and discomfort involving his head mainly on the left side for 3-4 weeks this is present mainly in the morning but does improve as the day goes along. He denies headache or discomfort. Denies any sharp shooting pain  or any vision loss. He has been complaining of some progressive memory difficulties. He does get lost easily and has to rely on his GPS. He forgets appointments and even really writes things down. He does have long-standing depression and does take medications for that. Update 2/2/16PS : He returns for follow-up after last visit 4 months ago. He reports slight worsening of his left hand tremor which at times now interferes with his activities of daily living during the day as well. He has not tried a higher dose of Mysoline be on 125 mg at bedtime yet but is willing to do so. He states his memory difficulties are unchanged. He remains compliant with his depression medications and sees a psychiatrist regularly. He had lab work done at last visit on 10/16/13 which showed normal vitamin B12, TSH. RPR and HIV antibodies were negative. Carotid ultrasound done on 10/30/13 personally reviewed by me shows no significant extracranial stenosis. Update 10/28/2016PS : He returns for follow-up after last visit 8 months ago. Is accompanied by a friend [name]. He has noticed significant worsening of his memory particularly short-term. This has been gradual over several years but more recently. The patient states that he has been regularly seeing a psychiatrist [name] on a monthly basis and he is on Paxil 40 mg a day. On Mini-Mental status testing today scored 21/30 which is significant decline from 28/30 at last visit. Patient however remains independent in activities of daily living. He lives alone. He states his hand tremors are quite minimal and not bothersome. He remains on primidone 125 mg twice daily. He  denies any recent headaches, falls or head injuries. Prior to last visit he had vitamin B12, TSH and RPR tested which were normal. Update 1/5/2017PS : He returns for follow-up after last visit to 2 months ago. Is accompanied by his cousin [name]. Patient has noticed improvement in his memory and cognition. He has  been compliant with his depression medication which is taking without fail. Today scored 27/30 compared to 21/30 at last visit. He is independent in activities of daily living. He was seen by a psychiatrist's [name] who felt his depression was adequately treated and was concerned about his memory was actually getting worse. Patient himself actually denies this. He had EEG done on 01/08/15 which was normal which I personally reviewed and MRI scan of the brain on 12/04/14 which showed mild changes of chronic microvascular ischemia and atrophy which is unchanged compared with previous MRI. Update 8/4/2017PS : He returns for follow-up after last visit 7 months ago. He states his noticed worsening of his tremors. They involve mostly his hands but also at times his feet. He has trouble doing activities like holding a cup or newspaper. He can get some concentration. His tremors. He has been taking Mysoline 125 mg twice daily for his long-standing essential tremor but feels he needs more medicine now.  Patient states his memory difficulties remain unchanged and are not progressive. He feels his depression is adequately treated but on geriatric depression scale today scored 8 suggestive of mild depression. On [place] he scored23/30 with deficits in recall and orientation.  UPDATE 10/25/2017CM [name] 65 year old male returns for follow-up. He has a history of stroke event in 2011 and was on Plavix until about a year ago. When asked if he can take aspirin he claims he had nausea and vomiting balance issues and dizziness from aspirin. He also has a history of essential tremor and is currently on Mysoline 250 at night he is not taking the morning dose and  his tremor is worse during the day he claims however I see very little tremor on exam. Could be related to anxiety. His memory difficulties are stable and his MOCA was not repeated today. He returns for reevaluation previous records reviewed UPDATE 11/16/2017CM [name], 65 year old male returns for follow-up. He was just seen the end of October. He has history of stroke event in 2011 and tremor. He has been on Mysoline which was increased at his last visit however he cannot see much improvement. He thinks his tremor is worse when sitting still, no difficulty specifically with eating. Has history of depression and see psychiatry. Mild resting tremor noted today on the right very intermittent no essential tremor. Says his sense of smell, comes and goes. He has had no gait difficulty or falls. He returns for reevaluation REVIEW OF SYSTEMS: Full 14 system review of systems performed and notable only for those listed, all others are neg:  Constitutional: neg  Cardiovascular: neg Ear/Nose/Throat: neg  Skin: neg Eyes: neg Respiratory: neg Gastroitestinal: neg  Hematology/Lymphatic: neg  Endocrine: neg Musculoskeletal:neg Allergy/Immunology: neg Neurological: Tremors, history of stroke Psychiatric: Depression and anxiety followed by psych Sleep : neg   ALLERGIES: Allergies  Allergen Reactions  . Aspirin Other (See Comments)    Dizziness, nausea and vomiting Balance issues  . Lisinopril     critical  . Lithium     critical  . Oxycodone     HOME MEDICATIONS: Outpatient Medications Prior to Visit  Medication Sig Dispense Refill  . amLODipine (NORVASC) 10  MG tablet Take 10 mg by mouth daily.    . clopidogrel (PLAVIX) 75 MG tablet Take 1 tablet (75 mg total) by mouth daily. 30 tablet 11  . diazepam (VALIUM) 2 MG tablet Take 2 mg by mouth as needed (anxiety).     Marland Kitchen losartan (COZAAR) 100 MG tablet     . lovastatin (MEVACOR) 10 MG tablet Take 10 mg by mouth at bedtime.    . metoprolol succinate (TOPROL-XL) 25 MG 24 hr tablet Take 25 mg by mouth daily.    Marland Kitchen PARoxetine (PAXIL) 40 MG tablet Take 40 mg by mouth daily.  1  . prazosin (MINIPRESS) 2 MG capsule Take 2 mg by mouth at bedtime.    . primidone (MYSOLINE) 250 MG tablet Take 1 tablet (250 mg  total) by mouth 2 (two) times daily. 60 tablet 8  . valACYclovir (VALTREX) 500 MG tablet Take 500 mg by mouth daily.  11  . Armodafinil 150 MG tablet Take 150 mg by mouth daily.    Marland Kitchen REXULTI 3 MG TABS Take 1 tablet by mouth every morning.  0   No facility-administered medications prior to visit.     PAST MEDICAL HISTORY: Past Medical History:  Diagnosis Date  . Depression   . Frequent urination   . GERD (gastroesophageal reflux disease)   . Herpes   . Hyperlipidemia   . Hypertension   . Memory loss   . Obesity   . Stroke ([place])   . Urgency of urination     PAST SURGICAL HISTORY: Past Surgical History:  Procedure Laterality Date  . disckec    . SPINAL FUSION    . VASECTOMY      FAMILY HISTORY: Family History  Problem Relation Age of Onset  . Hypertension Mother   . Hypertension Father   . Hypertension Sister   . Hypertension Brother   . Cancer Maternal Aunt   . Cancer - Ovarian Maternal Grandmother   . Diabetes Mellitus I Paternal Grandmother   . Hypertension Paternal Grandfather   . Cancer Maternal Aunt     SOCIAL HISTORY: Social History   Social History  . Marital status: Legally Separated    Spouse name: N/A  . Number of children: 1  . Years of education: Masters   Occupational History  . retired Retired   Social History Main Topics  . Smoking status: Former Research scientist (life sciences)  . Smokeless tobacco: Never Used  . Alcohol use 0.6 oz/week    1 Cans of beer per week     Comment: socially  . Drug use: No  . Sexual activity: No   Other Topics Concern  . Not on file   Social History Narrative   Patient is separated.   Patient lives alone and has one child.   Patient is retired.   Patient is right-handed.   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   Patient does not drink any caffeine.     PHYSICAL EXAM  Vitals:   12/03/15 1305  BP: 127/77  Pulse: 62  Weight: 194 lb 6.4 oz (88.2 kg)  Height: 5\' 7"  (1.702 m)   Body mass index is 30.45  kg/m. Generalized: Well developed middle aged african [place] male, in no acute distress , mild masking of the face  Head: normocephalic and atraumatic,   Neck: Supple, no carotid bruits  Cardiac: Regular rate rhythm, no murmur  Musculoskeletal: No deformity   Neurological examination  Mentation: Alert oriented to time, place, history taking. Follows all commands speech  and language fluent . Cranial nerve II-XII: Pupils were equal round reactive to light extraocular movements were full, visual field were full on confrontational test. Facial sensation and strength were normal. hearing was intact to finger rubbing bilaterally. Uvula tongue midline. head turning and shoulder shrug and were normal and symmetric.Tongue protrusion into cheek strength was normal.  Motor: normal bulk and tone, full strength in the BUE, BLE, fine finger movements normal, no pronator drift. No focal weakness. Mild RUE resting  tremor noted,  no cogwheeling no bradykinesia, no increased tone Sensory: normal and symmetric to light touch, pinprick, and vibration in the upper and lower extremities Coordination: finger-nose-finger, heel-to-shin bilaterally, no dysmetria  Gait and Station: Rising up from seated position without assistance, normal stance, without trunk ataxia, moderate stride, good arm swing, smooth turning, able to perform tiptoe, and heel walking without difficulty.  Reflexes: equal and symmetric   DIAGNOSTIC DATA (LABS, IMAGING, TESTING) -ASSESSMENT AND PLAN 45 year African American male with upper extremity resting tremor intermittently,  likely anxiety related.  History of right brain subcortical infarct due to small vessel disease in December 2011 with no significant residual deficits. History of peripheral neuropathy diagnosis but no real clinical symptoms or signs on exam or on nerve conduction/EMG exam. Vascular risk factors of hypertension and hyperlipidemia. Mild memory difficulties likely due to  underlying   depression versus early dementia.The patient is a current patient of Dr. [name]  who is out of the office today . This note is sent to the work in doctor.     PLAN:Seen in consult with Dr. Jaynee Eagles Discontinue night time dose of Mysoline now wait 2 weeks then discontinue morning dose Begin Sinemet 25/100 twice daily 30 min before meals Continue Plavix daily Continue lovastatin for lipid management Continue follow-up with Dr. Lin Landsman for psychiatric issues and depression Follow up with Dr. [name] in 3 months Dennie Bible, [facility] [place], [facility], [facility] [address] [place], [place] [address] [phone]

## 2015-12-03 NOTE — Patient Instructions (Addendum)
Discontinue night time dose of Mysoline now wait 2 weeks then discontinue morning dose Begin Sinemet 25/100 twice daily 30 min before meals Continue Plavix daily Continue lovastatin for lipid management Continue follow-up with Dr. Lin Landsman for psychiatric issues and depression Follow up with Dr. [name] in 3 months

## 2015-12-12 NOTE — Progress Notes (Signed)
Personally  participated in and made any corrections needed to history, physical, neuro exam,assessment and plan as stated above.  I have personally obtained the history, evaluated lab date, reviewed imaging studies and agree with radiology interpretations.    Antonia Ahern, MD  

## 2015-12-16 ENCOUNTER — Telehealth: Payer: Self-pay | Admitting: Neurology

## 2015-12-16 NOTE — Telephone Encounter (Signed)
Patient is calling saying he has had shakes for a couple of months. He says he has been treated for them before but does not know what medication he was given. Please call and discuss. The patient uses [facility] in [place].

## 2015-12-17 DIAGNOSIS — F332 Major depressive disorder, recurrent severe without psychotic features: Secondary | ICD-10-CM | POA: Diagnosis not present

## 2015-12-17 DIAGNOSIS — F902 Attention-deficit hyperactivity disorder, combined type: Secondary | ICD-10-CM | POA: Diagnosis not present

## 2015-12-17 MED ORDER — TOPIRAMATE 25 MG PO TABS: ORAL_TABLET | ORAL | 3 refills | Status: DC

## 2015-12-17 NOTE — Telephone Encounter (Signed)
Spoke to pt.   He stopped taking the mysoline one week after last seen 12/03/15.  This was supposed to be done over 2 wks.  He started the sinemet 25/100mg  tabs bid (taking 30-17min prior meals) the day he stopped the mysoline.  He has noted worsening tremors in hands and arms, (but also his body shaking as well, but not as bad).  Taking the sinemet has not made a difference. Please advise.

## 2015-12-17 NOTE — Addendum Note (Signed)
Addended by: Evlyn Courier on: 12/17/2015 04:48 PM   Modules accepted: Orders

## 2015-12-17 NOTE — Telephone Encounter (Signed)
Called pt and he could not talk at that moment.  Will call back in 5 minutes.

## 2015-12-17 NOTE — Telephone Encounter (Signed)
I spoke to pt.   Relayed that CM had spoken to movement specialist in the office.  Recommended to stop sinemet.  Start topamax 25mg  po qhs for 1 wk, increase 2 tabs po qhs for one week then 3 tabs po qhs.  This is to be increased slowly.  Instructed that metoprolol is used for tremors as well.  Keep appt with Dr. [name] 03-15-15 at 1400.  Call back for problems, questions.   He verbalized understanding.  Relayed to read information relating to side effects (drowsiness, etc).

## 2015-12-17 NOTE — Telephone Encounter (Signed)
Discussed his tremor with movement specialist Dr. Rexene Alberts. She recommends stopping Sinemet.  Try Topamax 25 at hs for 1 week then increase by 1 tab weekly until you are taking 3 tabs. This medication must be titrated slowly. You are already on a beta blocker that treats tremor. Keep f/u appt with Dr. [name] in Feb. Med called in

## 2015-12-23 ENCOUNTER — Other Ambulatory Visit: Payer: Self-pay | Admitting: Neurology

## 2015-12-23 DIAGNOSIS — G250 Essential tremor: Secondary | ICD-10-CM

## 2016-01-25 DIAGNOSIS — F332 Major depressive disorder, recurrent severe without psychotic features: Secondary | ICD-10-CM | POA: Diagnosis not present

## 2016-01-26 DIAGNOSIS — F332 Major depressive disorder, recurrent severe without psychotic features: Secondary | ICD-10-CM | POA: Diagnosis not present

## 2016-01-28 DIAGNOSIS — I10 Essential (primary) hypertension: Secondary | ICD-10-CM | POA: Diagnosis not present

## 2016-01-28 DIAGNOSIS — L821 Other seborrheic keratosis: Secondary | ICD-10-CM | POA: Diagnosis not present

## 2016-01-28 DIAGNOSIS — Z8673 Personal history of transient ischemic attack (TIA), and cerebral infarction without residual deficits: Secondary | ICD-10-CM | POA: Diagnosis not present

## 2016-01-28 DIAGNOSIS — E785 Hyperlipidemia, unspecified: Secondary | ICD-10-CM | POA: Diagnosis not present

## 2016-01-28 DIAGNOSIS — A6000 Herpesviral infection of urogenital system, unspecified: Secondary | ICD-10-CM | POA: Diagnosis not present

## 2016-01-28 DIAGNOSIS — K219 Gastro-esophageal reflux disease without esophagitis: Secondary | ICD-10-CM | POA: Diagnosis not present

## 2016-01-28 DIAGNOSIS — R5383 Other fatigue: Secondary | ICD-10-CM | POA: Diagnosis not present

## 2016-02-08 DIAGNOSIS — F332 Major depressive disorder, recurrent severe without psychotic features: Secondary | ICD-10-CM | POA: Diagnosis not present

## 2016-02-10 DIAGNOSIS — M25332 Other instability, left wrist: Secondary | ICD-10-CM | POA: Diagnosis not present

## 2016-02-17 DIAGNOSIS — M25332 Other instability, left wrist: Secondary | ICD-10-CM | POA: Diagnosis not present

## 2016-02-17 DIAGNOSIS — M25532 Pain in left wrist: Secondary | ICD-10-CM | POA: Diagnosis not present

## 2016-02-22 ENCOUNTER — Ambulatory Visit: Payer: Medicare Other | Admitting: Nurse Practitioner

## 2016-02-22 DIAGNOSIS — F332 Major depressive disorder, recurrent severe without psychotic features: Secondary | ICD-10-CM | POA: Diagnosis not present

## 2016-03-07 DIAGNOSIS — F332 Major depressive disorder, recurrent severe without psychotic features: Secondary | ICD-10-CM | POA: Diagnosis not present

## 2016-03-14 ENCOUNTER — Ambulatory Visit (INDEPENDENT_AMBULATORY_CARE_PROVIDER_SITE_OTHER): Payer: Medicare Other | Admitting: Neurology

## 2016-03-14 ENCOUNTER — Encounter: Payer: Self-pay | Admitting: Neurology

## 2016-03-14 VITALS — BP 131/82 | HR 61 | Wt 198.4 lb

## 2016-03-14 DIAGNOSIS — G250 Essential tremor: Secondary | ICD-10-CM

## 2016-03-14 MED ORDER — TOPIRAMATE 25 MG PO TABS: ORAL_TABLET | ORAL | 3 refills | Status: DC

## 2016-03-14 NOTE — Patient Instructions (Signed)
I had a long discussion with the patient regarding his memory loss and mild cognitive impairment which appears to be stable and somewhat improved with the more optimal treatment of his depression.   He was counseled to be compliant with his depression medications. He is complaining of increasing tremors which are interfering with his activities of daily living despite the increase dose of primidone to   250 mg twice daily hence will increase Topamax to 75 mg twice daily if tolerated .He was asked to return for follow-up in 6 months or call earlier if necessary

## 2016-03-14 NOTE — Progress Notes (Signed)
[facility]  PATIENT: [name] DOB: 1950-10-25   REASON FOR VISIT: Follow-up for  tremor, memory loss, history of stroke HISTORY FROM: Patient  HISTORY OF PRESENT ILLNESS:61 year African American male with upper extremity fine action tremors likely anxiety related. Benign essential tremor is possible though less likely. History of right brain subcortical infarct due to small vessel disease in December 2011 with no significant residual deficits. History of peripheral neuropathy diagnosis but no real clinical symptoms or signs on exam or on nerve conduction/EMG exam.. Vascular risk factors of hypertension and hyperlipidemia. New 50 llbs weightloss in 1 year without determined cause.  He returns for followup after his last visit on 12/08/11. He states his noticed some improvement in his tremors after starting the primidone which he takes 100 mg daily. He seems to be tolerating it well without any drowsiness, dryness of mouth or other side effects. He states payment on is helping him more than intra-and is wondering if he can come off the Inderal. He has no new complaints. He had lab work done on 12/08/11 which showed normal thyroid function tests and LFTs. He's had no new stroke or TIA symptoms. He remains on Plavix and is tolerating it well without side effects. He states his blood pressure is under good control and it is 131/80 today.   Update 10/16/2013 : He returns for followup after his last visit 6 months ago. He continues to have mild tremors in his hands and is tolerating primadone 125 mg at night fairly well without significant side effects. He states the tremors do not affect his routine or date and activities. She has been complaining of some paresthesias and discomfort involving his head mainly on the left side for 3-4 weeks this is present mainly in the morning but does improve as the day goes along. He denies headache or discomfort. Denies any sharp shooting pain  or any vision loss. He has been complaining of some progressive memory difficulties. He does get lost easily and has to rely on his GPS. He forgets appointments and even really writes things down. He does have long-standing depression and does take medications for that. Update 2/2/16PS : He returns for follow-up after last visit 4 months ago. He reports slight worsening of his left hand tremor which at times now interferes with his activities of daily living during the day as well. He has not tried a higher dose of Mysoline be on 125 mg at bedtime yet but is willing to do so. He states his memory difficulties are unchanged. He remains compliant with his depression medications and sees a psychiatrist regularly. He had lab work done at last visit on 10/16/13 which showed normal vitamin B12, TSH. RPR and HIV antibodies were negative. Carotid ultrasound done on 10/30/13 personally reviewed by me shows no significant extracranial stenosis. Update 10/28/2016PS : He returns for follow-up after last visit 8 months ago. Is accompanied by a friend [name]. He has noticed significant worsening of his memory particularly short-term. This has been gradual over several years but more recently. The patient states that he has been regularly seeing a psychiatrist [name] on a monthly basis and he is on Paxil 40 mg a day. On Mini-Mental status testing today scored 21/30 which is significant decline from 28/30 at last visit. Patient however remains independent in activities of daily living. He lives alone. He states his hand tremors are quite minimal and not bothersome. He remains on primidone 125 mg twice daily. He  denies any recent headaches, falls or head injuries. Prior to last visit he had vitamin B12, TSH and RPR tested which were normal. Update 1/5/2017PS : He returns for follow-up after last visit to 2 months ago. Is accompanied by his cousin [name]. Patient has noticed improvement in his memory and cognition. He has  been compliant with his depression medication which is taking without fail. Today scored 27/30 compared to 21/30 at last visit. He is independent in activities of daily living. He was seen by a psychiatrist's [name] who felt his depression was adequately treated and was concerned about his memory was actually getting worse. Patient himself actually denies this. He had EEG done on 01/08/15 which was normal which I personally reviewed and MRI scan of the brain on 12/04/14 which showed mild changes of chronic microvascular ischemia and atrophy which is unchanged compared with previous MRI. Update 8/4/2017PS : He returns for follow-up after last visit 7 months ago. He states his noticed worsening of his tremors. They involve mostly his hands but also at times his feet. He has trouble doing activities like holding a cup or newspaper. He can get some concentration. His tremors. He has been taking Mysoline 125 mg twice daily for his long-standing essential tremor but feels he needs more medicine now.  Patient states his memory difficulties remain unchanged and are not progressive. He feels his depression is adequately treated but on geriatric depression scale today scored 8 suggestive of mild depression. On Flippin he scored23/30 with deficits in recall and orientation.  UPDATE 10/25/2017CM Mr. Blan 66 year old male returns for follow-up. He has a history of stroke event in 2011 and was on Plavix until about a year ago. When asked if he can take aspirin he claims he had nausea and vomiting balance issues and dizziness from aspirin. He also has a history of essential tremor and is currently on Mysoline 250 at night he is not taking the morning dose and  his tremor is worse during the day he claims however I see very little tremor on exam. Could be related to anxiety. His memory difficulties are stable and his MOCA was not repeated today. He returns for reevaluation previous records reviewed UPDATE 11/16/2017CM Mr.  Merenda, 66 year old male returns for follow-up. He was just seen the end of October. He has history of stroke event in 2011 and tremor. He has been on Mysoline which was increased at his last visit however he cannot see much improvement. He thinks his tremor is worse when sitting still, no difficulty specifically with eating. Has history of depression and see psychiatry. Mild resting tremor noted today on the right very intermittent no essential tremor. Says his sense of smell, comes and goes. He has had no gait difficulty or falls. He returns for reevaluation Update 03/14/2016 : He returns for follow-up after last visit 3 months ago. He had not noticed significant benefit of increasing the dose of Mysoline to 250 twice daily. He was asked to taper off and discontinue Mysoline instructed take Sinemet by Cecille Rubin nurse practitioner however the patient for unclear reason has chosen not to do it. He remains on Topamax 75 mg daily as well as Mysoline to 50 twice daily. Patient states that he caught driving under the influence of medications and has lost his driving license. He does complain of sleepiness and tiredness during the day. He remains on Plavix for stroke prevention is tolerating it well without side effects. He states his blood pressure well controlled today was  131/82. Hie has  no further stroke or TIA symptoms. He states his eye memory difficulties unchanged and not getting worse. REVIEW OF SYSTEMS: Full 14 system review of systems performed and notable only for those listed, all others are neg:   Tremors, depression, snoring, shaking and all other systems negative ALLERGIES: Allergies  Allergen Reactions  . Aspirin Other (See Comments)    Dizziness, nausea and vomiting Balance issues  . Lisinopril     critical  . Lithium     critical  . Oxycodone     HOME MEDICATIONS: Outpatient Medications Prior to Visit  Medication Sig Dispense Refill  . amLODipine (NORVASC) 10 MG tablet Take 10  mg by mouth daily.    . Armodafinil 200 MG TABS daily.     . clopidogrel (PLAVIX) 75 MG tablet Take 1 tablet (75 mg total) by mouth daily. 30 tablet 11  . diazepam (VALIUM) 5 MG tablet Take 5 mg by mouth at bedtime as needed for anxiety.    . INGREZZA 40 MG CAPS     . losartan (COZAAR) 100 MG tablet     . lovastatin (MEVACOR) 10 MG tablet Take 10 mg by mouth at bedtime.    . metoprolol succinate (TOPROL-XL) 25 MG 24 hr tablet Take 25 mg by mouth daily.    Marland Kitchen PARoxetine (PAXIL) 40 MG tablet Take 40 mg by mouth daily.  1  . prazosin (MINIPRESS) 2 MG capsule Take 2 mg by mouth at bedtime.    . primidone (MYSOLINE) 250 MG tablet TAKE 1/2 TABLET BY MOUTH IN THE MORNING 1 TAB AT NIGHT X1 WEEK THEN 1 TAB TWICE A DAY 90 tablet 2  . REXULTI 3 MG TABS Take 1 tablet by mouth every morning.  0  . valACYclovir (VALTREX) 500 MG tablet Take 500 mg by mouth daily.  11  . topiramate (TOPAMAX) 25 MG tablet 1 po at hs for 1 week then increase to 2 tabs at hs for 1 week then 3 tabs at hs 90 tablet 3  . VIAGRA 100 MG tablet     . diazepam (VALIUM) 2 MG tablet Take 2 mg by mouth as needed (anxiety).      No facility-administered medications prior to visit.     PAST MEDICAL HISTORY: Past Medical History:  Diagnosis Date  . Depression   . Frequent urination   . GERD (gastroesophageal reflux disease)   . Herpes   . Hyperlipidemia   . Hypertension   . Memory loss   . Obesity   . Stroke ([place])   . Urgency of urination     PAST SURGICAL HISTORY: Past Surgical History:  Procedure Laterality Date  . disckec    . SPINAL FUSION    . VASECTOMY      FAMILY HISTORY: Family History  Problem Relation Age of Onset  . Hypertension Mother   . Hypertension Father   . Hypertension Sister   . Hypertension Brother   . Cancer Maternal Aunt   . Cancer - Ovarian Maternal Grandmother   . Diabetes Mellitus I Paternal Grandmother   . Hypertension Paternal Grandfather   . Cancer Maternal Aunt     SOCIAL  HISTORY: Social History   Social History  . Marital status: Legally Separated    Spouse name: N/A  . Number of children: 1  . Years of education: Masters   Occupational History  . retired Retired   Social History Main Topics  . Smoking status: Former Research scientist (life sciences)  . Smokeless tobacco:  Never Used  . Alcohol use No     Comment: socially  . Drug use: No  . Sexual activity: No   Other Topics Concern  . Not on file   Social History Narrative   Patient is separated.   Patient lives alone and has one child.   Patient is retired.   Patient is right-handed.   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   Patient does not drink any caffeine.     PHYSICAL EXAM  Vitals:   03/14/16 1410  BP: 131/82  Pulse: 61  Weight: 198 lb 6.4 oz (90 kg)   Body mass index is 31.07 kg/m. Generalized: Well developed middle aged african [place] male, in no acute distress , mild masking of the face  Head: normocephalic and atraumatic,   Neck: Supple, no carotid bruits  Cardiac: Regular rate rhythm, no murmur  Musculoskeletal: No deformity   Neurological examination  Mentation: Alert oriented to time, place, history taking. Follows all commands speech and language fluent . Cranial nerve II-XII: Pupils were equal round reactive to light extraocular movements were full, visual field were full on confrontational test. Facial sensation and strength were normal. hearing was intact to finger rubbing bilaterally. Uvula tongue midline. head turning and shoulder shrug and were normal and symmetric.Tongue protrusion into cheek strength was normal.  Motor: normal bulk and tone, full strength in the BUE, BLE, fine finger movements normal, no pronator drift. No focal weakness. Mild RUE and right lower extremity resting  tremor noted,  no cogwheeling no bradykinesia, no increased tone Sensory: normal and symmetric to light touch, pinprick, and vibration in the upper and lower extremities Coordination: finger-nose-finger,  heel-to-shin bilaterally, no dysmetria  Gait and Station: Rising up from seated position without assistance, normal stance, without trunk ataxia, moderate stride, good arm swing, smooth turning, able to perform tiptoe, and heel walking without difficulty.  Reflexes: equal and symmetric   DIAGNOSTIC DATA (LABS, IMAGING, TESTING) -ASSESSMENT AND PLAN 31 year African American male with upper extremity resting tremor intermittently,  likely anxiety related.  History of right brain subcortical infarct due to small vessel disease in December 2011 with no significant residual deficits. History of peripheral neuropathy diagnosis but no real clinical symptoms or signs on exam or on nerve conduction/EMG exam. Vascular risk factors of hypertension and hyperlipidemia. Mild memory difficulties likely due to underlying   depression versus early dementia which appears stable  PLAN:  I had a long discussion with the patient regarding his memory loss and mild cognitive impairment which appears to be stable and somewhat improved with the more optimal treatment of his depression.   He was counseled to be compliant with his depression medications. He is complaining of increasing tremors which are interfering with his activities of daily living despite the increase dose of primidone to   250 mg twice daily hence will increase Topamax to 75 mg twice daily if tolerated .He was asked not to drive as per [place] law and  to return for follow-up in 6 months or call earlier if necessary Antony Contras, MD [facility] [address], [place], [place] [address] [phone]

## 2016-03-21 DIAGNOSIS — F332 Major depressive disorder, recurrent severe without psychotic features: Secondary | ICD-10-CM | POA: Diagnosis not present

## 2016-03-23 DIAGNOSIS — M1812 Unilateral primary osteoarthritis of first carpometacarpal joint, left hand: Secondary | ICD-10-CM | POA: Diagnosis not present

## 2016-03-23 DIAGNOSIS — M25332 Other instability, left wrist: Secondary | ICD-10-CM | POA: Diagnosis not present

## 2016-04-04 DIAGNOSIS — F332 Major depressive disorder, recurrent severe without psychotic features: Secondary | ICD-10-CM | POA: Diagnosis not present

## 2016-04-18 DIAGNOSIS — F332 Major depressive disorder, recurrent severe without psychotic features: Secondary | ICD-10-CM | POA: Diagnosis not present

## 2016-04-20 ENCOUNTER — Other Ambulatory Visit: Payer: Self-pay | Admitting: Neurology

## 2016-04-20 DIAGNOSIS — G250 Essential tremor: Secondary | ICD-10-CM

## 2016-05-02 DIAGNOSIS — F332 Major depressive disorder, recurrent severe without psychotic features: Secondary | ICD-10-CM | POA: Diagnosis not present

## 2016-05-11 ENCOUNTER — Ambulatory Visit: Payer: Medicare Other | Admitting: Nurse Practitioner

## 2016-05-16 ENCOUNTER — Telehealth: Payer: Self-pay | Admitting: Neurology

## 2016-05-16 DIAGNOSIS — F332 Major depressive disorder, recurrent severe without psychotic features: Secondary | ICD-10-CM | POA: Diagnosis not present

## 2016-05-16 NOTE — Telephone Encounter (Signed)
Rn call Cathy at [phone] about pts care. [name] states she has concerns about his memory and cognitive ability. She has been working with him for over 10 years. She would like to know if Dr. [name] can maybe refer the patient for neuro psych testing. Rn stated [facility] needs a release form sign by the patient to discuss pts neurological care with him. Rn unless she referred him here, a release form is needed. [name] will see pt in two weeks and provide a release form sign by patient. Rn gave [facility] fax number of [phone] attention Katrina Janalyn Rouse. Cathy verbalized understanding.

## 2016-05-16 NOTE — Telephone Encounter (Signed)
[facility] Practice Practical Nurse ) is pt's physc therapist, pt see's her every 2 weeks for about 10 years. Dr Ready is his perscriber at [phone], cathy has questions and would like a call on either her cell [phone] or office# [phone]

## 2016-05-30 DIAGNOSIS — F332 Major depressive disorder, recurrent severe without psychotic features: Secondary | ICD-10-CM | POA: Diagnosis not present

## 2016-06-06 ENCOUNTER — Other Ambulatory Visit: Payer: Self-pay | Admitting: *Deleted

## 2016-06-06 DIAGNOSIS — R413 Other amnesia: Secondary | ICD-10-CM

## 2016-06-06 NOTE — Telephone Encounter (Signed)
Pt. saw Dr. [name] in Feb. and memory was documented to be improved with tx. of depression. I have spoken with [name] (psych counselor, worked with Dr. Lin Landsman).  She reports seeing pt. every 2 weeks for 50 min. sessions.  Sts. she feels pt's memory is worsening, and she requests pt. be referred for neuropsych testing.  Dr. [name] is out of the office.  Will check with Hoyle Sauer to see if this is a decision she feels comfortable making, or if pt. needs ov with NP or MD prior to referral.

## 2016-06-06 NOTE — Telephone Encounter (Signed)
Order placed to Neuropsych.

## 2016-06-06 NOTE — Telephone Encounter (Signed)
[name] called said the pt needs a referral for memory testing and very severe depression. Is memory issues related to depression, what is the source of the memory problems. Please call at [phone]

## 2016-06-06 NOTE — Telephone Encounter (Signed)
Please set up for neuro psych with Olean Ree at [facility]

## 2016-06-07 ENCOUNTER — Encounter: Payer: Self-pay | Admitting: Psychology

## 2016-06-07 DIAGNOSIS — R350 Frequency of micturition: Secondary | ICD-10-CM | POA: Diagnosis not present

## 2016-06-08 DIAGNOSIS — F332 Major depressive disorder, recurrent severe without psychotic features: Secondary | ICD-10-CM | POA: Diagnosis not present

## 2016-06-27 DIAGNOSIS — M1812 Unilateral primary osteoarthritis of first carpometacarpal joint, left hand: Secondary | ICD-10-CM | POA: Diagnosis not present

## 2016-06-27 DIAGNOSIS — M25332 Other instability, left wrist: Secondary | ICD-10-CM | POA: Diagnosis not present

## 2016-06-27 DIAGNOSIS — F332 Major depressive disorder, recurrent severe without psychotic features: Secondary | ICD-10-CM | POA: Diagnosis not present

## 2016-07-18 DIAGNOSIS — F332 Major depressive disorder, recurrent severe without psychotic features: Secondary | ICD-10-CM | POA: Diagnosis not present

## 2016-08-01 DIAGNOSIS — F332 Major depressive disorder, recurrent severe without psychotic features: Secondary | ICD-10-CM | POA: Diagnosis not present

## 2016-08-03 DIAGNOSIS — G3184 Mild cognitive impairment, so stated: Secondary | ICD-10-CM | POA: Diagnosis not present

## 2016-08-03 DIAGNOSIS — K219 Gastro-esophageal reflux disease without esophagitis: Secondary | ICD-10-CM | POA: Diagnosis not present

## 2016-08-03 DIAGNOSIS — Z6831 Body mass index (BMI) 31.0-31.9, adult: Secondary | ICD-10-CM | POA: Diagnosis not present

## 2016-08-03 DIAGNOSIS — L309 Dermatitis, unspecified: Secondary | ICD-10-CM | POA: Diagnosis not present

## 2016-08-03 DIAGNOSIS — I10 Essential (primary) hypertension: Secondary | ICD-10-CM | POA: Diagnosis not present

## 2016-08-03 DIAGNOSIS — Z8673 Personal history of transient ischemic attack (TIA), and cerebral infarction without residual deficits: Secondary | ICD-10-CM | POA: Diagnosis not present

## 2016-08-03 DIAGNOSIS — A6000 Herpesviral infection of urogenital system, unspecified: Secondary | ICD-10-CM | POA: Diagnosis not present

## 2016-08-03 DIAGNOSIS — Z0000 Encounter for general adult medical examination without abnormal findings: Secondary | ICD-10-CM | POA: Diagnosis not present

## 2016-08-03 DIAGNOSIS — E785 Hyperlipidemia, unspecified: Secondary | ICD-10-CM | POA: Diagnosis not present

## 2016-08-03 DIAGNOSIS — E669 Obesity, unspecified: Secondary | ICD-10-CM | POA: Diagnosis not present

## 2016-08-04 ENCOUNTER — Other Ambulatory Visit: Payer: Self-pay

## 2016-08-04 MED ORDER — TOPIRAMATE 25 MG PO TABS: ORAL_TABLET | ORAL | 6 refills | Status: DC

## 2016-08-22 ENCOUNTER — Telehealth: Payer: Self-pay | Admitting: Nurse Practitioner

## 2016-08-22 DIAGNOSIS — F332 Major depressive disorder, recurrent severe without psychotic features: Secondary | ICD-10-CM | POA: Diagnosis not present

## 2016-08-22 NOTE — Telephone Encounter (Signed)
Juliann Pulse Showfety/Psych [phone] called request to speak with NP reg topiramate (TOPAMAX) 25 MG tablet. She said the pt has moderate short term memory issues, she is concerned about him using topamax. She is aware Hoyle Sauer is out of the office and this can wait until she returns.

## 2016-08-26 DIAGNOSIS — R351 Nocturia: Secondary | ICD-10-CM | POA: Diagnosis not present

## 2016-08-26 DIAGNOSIS — N401 Enlarged prostate with lower urinary tract symptoms: Secondary | ICD-10-CM | POA: Diagnosis not present

## 2016-09-05 DIAGNOSIS — F332 Major depressive disorder, recurrent severe without psychotic features: Secondary | ICD-10-CM | POA: Diagnosis not present

## 2016-09-05 NOTE — Telephone Encounter (Signed)
I did not place the patient on Topamax. His last 2 visits have been with  Dr. [name]

## 2016-09-05 NOTE — Telephone Encounter (Signed)
If [facility]  pts psych nurse calls back we need a release form to discuss pts medications with her. Pt has appt with neuropsychiatrist in September at [facility] neurology. This was discuss from telephone call 04/2016.

## 2016-09-12 ENCOUNTER — Ambulatory Visit (INDEPENDENT_AMBULATORY_CARE_PROVIDER_SITE_OTHER): Payer: Medicare Other | Admitting: Neurology

## 2016-09-12 ENCOUNTER — Encounter: Payer: Self-pay | Admitting: Neurology

## 2016-09-12 VITALS — BP 123/80 | HR 89 | Ht 67.0 in | Wt 194.6 lb

## 2016-09-12 DIAGNOSIS — G250 Essential tremor: Secondary | ICD-10-CM

## 2016-09-12 MED ORDER — TOPIRAMATE 25 MG PO TABS: ORAL_TABLET | ORAL | 6 refills | Status: DC

## 2016-09-12 NOTE — Progress Notes (Signed)
Of for of  [facility]  PATIENT: [name] DOB: 03-15-1950   REASON FOR VISIT: Follow-up for  tremor, memory loss, history of stroke HISTORY FROM: Patient  HISTORY OF PRESENT ILLNESS:61 year African American male with upper extremity fine action tremors likely anxiety related. Benign essential tremor is possible though less likely. History of right brain subcortical infarct due to small vessel disease in December 2011 with no significant residual deficits. History of peripheral neuropathy diagnosis but no real clinical symptoms or signs on exam or on nerve conduction/EMG exam.. Vascular risk factors of hypertension and hyperlipidemia. New 50 llbs weightloss in 1 year without determined cause.  He returns for followup after his last visit on 12/08/11. He states his noticed some improvement in his tremors after starting the primidone which he takes 100 mg daily. He seems to be tolerating it well without any drowsiness, dryness of mouth or other side effects. He states payment on is helping him more than intra-and is wondering if he can come off the Inderal. He has no new complaints. He had lab work done on 12/08/11 which showed normal thyroid function tests and LFTs. He's had no new stroke or TIA symptoms. He remains on Plavix and is tolerating it well without side effects. He states his blood pressure is under good control and it is 131/80 today.   Update 10/16/2013 : He returns for followup after his last visit 6 months ago. He continues to have mild tremors in his hands and is tolerating primadone 125 mg at night fairly well without significant side effects. He states the tremors do not affect his routine or date and activities. She has been complaining of some paresthesias and discomfort involving his head mainly on the left side for 3-4 weeks this is present mainly in the morning but does improve as the day goes along. He denies headache or discomfort. Denies any sharp  shooting pain or any vision loss. He has been complaining of some progressive memory difficulties. He does get lost easily and has to rely on his GPS. He forgets appointments and even really writes things down. He does have long-standing depression and does take medications for that. Update 2/2/16PS : He returns for follow-up after last visit 4 months ago. He reports slight worsening of his left hand tremor which at times now interferes with his activities of daily living during the day as well. He has not tried a higher dose of Mysoline be on 125 mg at bedtime yet but is willing to do so. He states his memory difficulties are unchanged. He remains compliant with his depression medications and sees a psychiatrist regularly. He had lab work done at last visit on 10/16/13 which showed normal vitamin B12, TSH. RPR and HIV antibodies were negative. Carotid ultrasound done on 10/30/13 personally reviewed by me shows no significant extracranial stenosis. Update 10/28/2016PS : He returns for follow-up after last visit 8 months ago. Is accompanied by a friend [name]. He has noticed significant worsening of his memory particularly short-term. This has been gradual over several years but more recently. The patient states that he has been regularly seeing a psychiatrist [name] on a monthly basis and he is on Paxil 40 mg a day. On Mini-Mental status testing today scored 21/30 which is significant decline from 28/30 at last visit. Patient however remains independent in activities of daily living. He lives alone. He states his hand tremors are quite minimal and not bothersome. He remains on primidone 125 mg  twice daily. He denies any recent headaches, falls or head injuries. Prior to last visit he had vitamin B12, TSH and RPR tested which were normal. Update 1/5/2017PS : He returns for follow-up after last visit to 2 months ago. Is accompanied by his cousin [name]. Patient has noticed improvement in his memory and  cognition. He has been compliant with his depression medication which is taking without fail. Today scored 27/30 compared to 21/30 at last visit. He is independent in activities of daily living. He was seen by a psychiatrist's [name] who felt his depression was adequately treated and was concerned about his memory was actually getting worse. Patient himself actually denies this. He had EEG done on 01/08/15 which was normal which I personally reviewed and MRI scan of the brain on 12/04/14 which showed mild changes of chronic microvascular ischemia and atrophy which is unchanged compared with previous MRI. Update 8/4/2017PS : He returns for follow-up after last visit 7 months ago. He states his noticed worsening of his tremors. They involve mostly his hands but also at times his feet. He has trouble doing activities like holding a cup or newspaper. He can get some concentration. His tremors. He has been taking Mysoline 125 mg twice daily for his long-standing essential tremor but feels he needs more medicine now.  Patient states his memory difficulties remain unchanged and are not progressive. He feels his depression is adequately treated but on geriatric depression scale today scored 8 suggestive of mild depression. On Home he scored23/30 with deficits in recall and orientation.  UPDATE 10/25/2017CM [name] 66 year old male returns for follow-up. He has a history of stroke event in 2011 and was on Plavix until about a year ago. When asked if he can take aspirin he claims he had nausea and vomiting balance issues and dizziness from aspirin. He also has a history of essential tremor and is currently on Mysoline 250 at night he is not taking the morning dose and  his tremor is worse during the day he claims however I see very little tremor on exam. Could be related to anxiety. His memory difficulties are stable and his MOCA was not repeated today. He returns for reevaluation previous records reviewed UPDATE  11/16/2017CM [name], 66 year old male returns for follow-up. He was just seen the end of October. He has history of stroke event in 2011 and tremor. He has been on Mysoline which was increased at his last visit however he cannot see much improvement. He thinks his tremor is worse when sitting still, no difficulty specifically with eating. Has history of depression and see psychiatry. Mild resting tremor noted today on the right very intermittent no essential tremor. Says his sense of smell, comes and goes. He has had no gait difficulty or falls. He returns for reevaluation Update 03/14/2016 : He returns for follow-up after last visit 3 months ago. He had not noticed significant benefit of increasing the dose of Mysoline to 250 twice daily. He was asked to taper off and discontinue Mysoline instructed take Sinemet by Cecille Rubin nurse practitioner however the patient for unclear reason has chosen not to do it. He remains on Topamax 75 mg daily as well as Mysoline to 50 twice daily. Patient states that he caught driving under the influence of medications and has lost his driving license. He does complain of sleepiness and tiredness during the day. He remains on Plavix for stroke prevention is tolerating it well without side effects. He states his blood pressure well  controlled today was 131/82. Hie has  no further stroke or TIA symptoms. He states his eye memory difficulties unchanged and not getting worse. Update 09/12/2016 ; he returns for follow-up after last visit 6 months ago. Patient states that increasing the Topamax has not helped and in fact he feels his memory difficulties have gotten worse. He does have an appointment to do neuropsych testing next month. He was supposed to be on Topamax 75 twice daily but in fact has been taking it only once a day. He states his tremors are also worse particularly when he is anxious. He was meant to be on Mysoline 250 mg twice daily but has been taking it only once  daily. Patient does see a psychiatrist [name] for his depression which he feels is adequately controlled. He denies any recurrent symptoms of stroke or TIAs. He is tolerating Plavix well without bleeding or bruising. He states his blood pressure is well controlled and today it is 123/80. He is tolerating Mevacor well without muscle aches and pains. He cannot tell me when her last lipid profile was checked. REVIEW OF SYSTEMS: Full 14 system review of systems performed and notable only for those listed, all others are neg:   Tremors, depression, , anxiety, nervousness, depression and all other systems negative   ALLERGIES: Allergies  Allergen Reactions  . Aspirin Other (See Comments)    Dizziness, nausea and vomiting Balance issues  . Lisinopril     critical  . Lithium     critical  . Oxycodone     HOME MEDICATIONS: Outpatient Medications Prior to Visit  Medication Sig Dispense Refill  . amLODipine (NORVASC) 10 MG tablet Take 10 mg by mouth daily.    . Armodafinil 200 MG TABS daily.     . clopidogrel (PLAVIX) 75 MG tablet Take 1 tablet (75 mg total) by mouth daily. 30 tablet 11  . diazepam (VALIUM) 5 MG tablet Take 5 mg by mouth at bedtime as needed for anxiety.    Marland Kitchen losartan (COZAAR) 100 MG tablet     . lovastatin (MEVACOR) 10 MG tablet Take 10 mg by mouth at bedtime.    . metoprolol succinate (TOPROL-XL) 25 MG 24 hr tablet Take 25 mg by mouth daily.    Marland Kitchen PARoxetine (PAXIL) 40 MG tablet Take 40 mg by mouth daily.  1  . prazosin (MINIPRESS) 2 MG capsule Take 2 mg by mouth at bedtime.    . primidone (MYSOLINE) 250 MG tablet Take 1 tablet (250 mg total) by mouth 2 (two) times daily. 60 tablet 11  . REXULTI 3 MG TABS Take 1 tablet by mouth every morning.  0  . valACYclovir (VALTREX) 500 MG tablet Take 500 mg by mouth daily.  11  . VIAGRA 100 MG tablet     . topiramate (TOPAMAX) 25 MG tablet Take 3 tablets in the morning and 1 tablet at night for 1 week followed by 3 tablets in the  morning and 2 tablets at night for 1 more week and then 3 tablets twice daily as tolerated. 180 tablet 6  . INGREZZA 40 MG CAPS     . primidone (MYSOLINE) 250 MG tablet TAKE 1/2 TABLET BY MOUTH IN THE MORNING 1 TAB AT NIGHT X1 WEEK THEN 1 TAB TWICE A DAY (Patient not taking: Reported on 09/12/2016) 90 tablet 2   No facility-administered medications prior to visit.     PAST MEDICAL HISTORY: Past Medical History:  Diagnosis Date  . Depression   .  Frequent urination   . GERD (gastroesophageal reflux disease)   . Herpes   . Hyperlipidemia   . Hypertension   . Memory loss   . Obesity   . Stroke ([place])   . Urgency of urination     PAST SURGICAL HISTORY: Past Surgical History:  Procedure Laterality Date  . disckec    . SPINAL FUSION    . VASECTOMY      FAMILY HISTORY: Family History  Problem Relation Age of Onset  . Hypertension Mother   . Hypertension Father   . Hypertension Sister   . Hypertension Brother   . Cancer Maternal Aunt   . Cancer - Ovarian Maternal Grandmother   . Diabetes Mellitus I Paternal Grandmother   . Hypertension Paternal Grandfather   . Cancer Maternal Aunt     SOCIAL HISTORY: Social History   Social History  . Marital status: Legally Separated    Spouse name: N/A  . Number of children: 1  . Years of education: Masters   Occupational History  . retired Retired   Social History Main Topics  . Smoking status: Former Research scientist (life sciences)  . Smokeless tobacco: Never Used  . Alcohol use No     Comment: socially  . Drug use: No  . Sexual activity: No   Other Topics Concern  . Not on file   Social History Narrative   Patient is separated.   Patient lives alone and has one child.   Patient is retired.   Patient is right-handed.   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   Patient does not drink any caffeine.     PHYSICAL EXAM  Vitals:   09/12/16 0903  BP: 123/80  Pulse: 89  Weight: 194 lb 9.6 oz (88.3 kg)  Height: 5\' 7"  (1.702 m)   Body mass  index is 30.48 kg/m. Generalized: Well developed middle aged african [place] male, in no acute distress , mild masking of the face  Head: normocephalic and atraumatic,   Neck: Supple, no carotid bruits  Cardiac: Regular rate rhythm, no murmur  Musculoskeletal: No deformity   Neurological examination  Mentation: Alert oriented to time, place, history taking. Follows all commands speech and language fluent .MMSE 26/30 with deficits in orientation and attention. Able to name 10 animals with 4 legs. Unable to do clock drawing. Cranial nerve II-XII: Pupils were equal round reactive to light extraocular movements were full, visual field were full on confrontational test. Facial sensation and strength were normal. hearing was intact to finger rubbing bilaterally. Uvula tongue midline. head turning and shoulder shrug and were normal and symmetric.Tongue protrusion into cheek strength was normal.  Motor: normal bulk and tone, full strength in the BUE, BLE, fine finger movements normal, no pronator drift. No focal weakness. Mild RUE and right lower extremity resting  tremor noted,  no cogwheeling no bradykinesia, no increased tone Sensory: normal and symmetric to light touch, pinprick, and vibration in the upper and lower extremities Coordination: finger-nose-finger, heel-to-shin bilaterally, no dysmetria  Gait and Station: Rising up from seated position without assistance, normal stance, without trunk ataxia, moderate stride, good arm swing, smooth turning, able to perform tiptoe, and heel walking without difficulty.  Reflexes: equal and symmetric   DIAGNOSTIC DATA (LABS, IMAGING, TESTING) -ASSESSMENT AND PLAN 66 year African American male with upper extremity resting tremor intermittently,  likely anxiety related.  History of right brain subcortical infarct due to small vessel disease in December 2011 with no significant residual deficits. History of peripheral neuropathy diagnosis  but no real clinical  symptoms or signs on exam or on nerve conduction/EMG exam. Vascular risk factors of hypertension and hyperlipidemia. Mild memory difficulties likely due to underlying   depression versus early dementia which appears stable  PLAN:  I had a long discussion with the patient regarding his memory loss and mild cognitive impairment which appears to be stable and somewhat improved with the more optimal treatment of his depression.   He was counseled to be compliant with his depression medications. He is complaining of increasing tremors which are interfering with his activities of daily living but has been taking primidone 250 mg only once a day instead of twice daily. I counseled him to take it twice daily. I  Recommend he reduce dose of Topamax to 50 mg daily as he is having cognitive side effects. He was advised to keep his upcoming appointment with neuropsychologist next month. Continue Plavix for stroke prevention with strict control of hypertension but blood pressure goal below 130/90 and lipids with LDL cholesterol goal below 70 mg percent. Greater than 50% time during this 25 minute visit was spent on counseling and coordination of care about his cognitive difficulties, tremors, stroke and answering questions. Return for follow-up in 3 months with my nurse practitioner   or call earlier if necessary  Antony Contras, MD [facility] [address], [place], [place] [address] [phone]

## 2016-09-12 NOTE — Patient Instructions (Addendum)
I had a long discussion with the patient regarding his memory loss and mild cognitive impairment which appears to be stable and somewhat improved with the more optimal treatment of his depression.   He was counseled to be compliant with his depression medications. He is complaining of increasing tremors which are interfering with his activities of daily living but has been taking primidone 250 mg only once a day instead of twice daily. I counseled him to take it twice daily. I  Recommend he reduce dose of Topamax to 50 mg daily as he is having cognitive side effects. He was advised to keep his upcoming appointment with neuropsychologist next month. Continue Plavix for stroke prevention with strict control of hypertension but blood pressure goal below 130/90 and lipids with LDL cholesterol goal below 70 mg percent. Return for follow-up in 3 months with my nurse practitioner   or call earlier if necessary

## 2016-09-13 ENCOUNTER — Ambulatory Visit: Payer: Medicare Other | Admitting: Neurology

## 2016-09-20 ENCOUNTER — Encounter: Payer: Self-pay | Admitting: Psychology

## 2016-09-20 ENCOUNTER — Ambulatory Visit (INDEPENDENT_AMBULATORY_CARE_PROVIDER_SITE_OTHER): Payer: Medicare Other | Admitting: Psychology

## 2016-09-20 DIAGNOSIS — F329 Major depressive disorder, single episode, unspecified: Secondary | ICD-10-CM

## 2016-09-20 DIAGNOSIS — F32A Depression, unspecified: Secondary | ICD-10-CM

## 2016-09-20 DIAGNOSIS — R413 Other amnesia: Secondary | ICD-10-CM | POA: Diagnosis not present

## 2016-09-20 NOTE — Progress Notes (Signed)
NEUROPSYCHOLOGICAL INTERVIEW (CPT: [id number])  Name: [name] Date of Birth: 12-29-1950 Date of Interview: 09/20/2016  Reason for Referral:  [name] is a 66 y.o. right-handed male who is referred for neuropsychological evaluation by Christopher Rubin, NP and Christopher Contras, MD, of [facility] due to concerns about memory loss. This patient is unaccompanied for today's visit.  History of Presenting Problem:  [name] has been followed by [facility] since 2015 for UE action tremor and subjective memory complaints. Records note a history of right brain subcortical infarct due to small vessel disease in December 2011 with no significant residual deficits. According to record review, it appears that the patient complained of worsening memory at some visits and reported improved memory at other visits. MoCA was 23/30 on 08/21/2015. In February 2018, he reported that he was caught driving under the influence of medication and had lost his driver's license. In March 2018, his psychotherapist, [name], requested that [facility] refer him for neuropsychological testing as she had been working with him for 10 years and was increasingly concerned about worsening memory issues. He is also followed by Dr. Reece Leon, psychiatrist, for medication management. The patient was last seen by Dr. Leonie Leon on 09/12/2016. He was taking Topamax and primidone differently than prescribed (taking less than instructed). He reported worsening memory and worsening tremor. He scored 26/30 on MMSE. He was unable to do the clock drawing test. He was advised to decrease Topamax to 50 mg daily and advised to increase primidone to 250 mg twice daily. At today's visit, the patient reports that he is complying with these medication instructions from Dr. Leonie Leon.   At today's visit, the patient reports that he has had memory difficulties for the past 7 years or so. He initially says he does not think this coincided with his history of  TIA/stroke, but then states that maybe it does. He feels the memory difficulties have been gradually worsening over time. Nervousness makes it worse. Nervousness also makes his tremor worse, but he still experiences the tremor even when he is not nervous. He reported that Nuvigil helps "open up my brain", but it does not help as much as Provigil which he was prescribe din the past.  He complains of difficulty concentrating and remembering things. Today he cannot recall the name of a medication he is taking even though he could remember it earlier this morning.  Upon direct questioning, the patient reported the following with regard to current cognitive functioning:   Forgetting recent conversations/events: Yes Misplacing/losing items: No Forgetting appointments or other obligations: No, I write them down (Note: he was 20 min late to appointment today) Forgetting to take medications: No, I write them down and use pillbox Starting but not finishing tasks: No Processing information more slowly: Yes Word-finding difficulty: Yes Spelling difficulty: Yes Comprehension difficulty: Yes Getting lost when driving: Yes this has happened Uncertain about directions when driving or passenger: Yes, relies on navigation system in his car.  There is no family history of dementia. There is no family history of tremor or Parkinson's disease.   [name] lives alone. He manages all complex ADLs independently including driving, medications, finances, appointments and meals.    He denies any difficulty with walking or balance.   He denies history of head injury.   He reports that he has longstanding sleep difficulty. He has never been diagnosed with sleep apnea or REM sleep behavior disorder. He sleeps alone and does not know if he acts out  dreams or moves around a lot in his sleep.  His appetite is reduced and he has been eating less. He has lost weight.    Psychiatric History: The patient reported a  long history of depression with onset immediately following a neck surgery. He denied any psychiatric history prior to that surgery. He reported that he has had multiple inpatient psychiatry hospitalizations due to suicidal ideation. He reported he has never attempted suicide. He denied history of psychosis. He denied any recent suicidal ideation. He sees his therapist and psychiatrist regularly. He reported that his current mood is "good". He admitted that his patience is "thin" now, "especially with this whole evaluation process".   He denied history of substance abuse or dependence.   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  Marital history: Divorced with 60yo daughter Alcohol: None Tobacco: Former, quit >20 years ago Denies any current substance abuse.   Medical History: Past Medical History:  Diagnosis Date  . Depression   . Frequent urination   . GERD (gastroesophageal reflux disease)   . Herpes   . Hyperlipidemia   . Hypertension   . Memory loss   . Obesity   . Stroke ([place])   . Urgency of urination      Current Medications:  Outpatient Encounter Prescriptions as of 09/20/2016  Medication Sig  . amLODipine (NORVASC) 10 MG tablet Take 10 mg by mouth daily.  . Armodafinil 200 MG TABS daily.   . clopidogrel (PLAVIX) 75 MG tablet Take 1 tablet (75 mg total) by mouth daily.  . diazepam (VALIUM) 5 MG tablet Take 5 mg by mouth at bedtime as needed for anxiety.  . Fluocinonide 0.1 % CREA APPLY 2-3 GRAMS TO AFFECTED AREAS 3-4 TIMES DAILY (1 GRAM = 1 DIME SIZE)  . losartan (COZAAR) 100 MG tablet   . lovastatin (MEVACOR) 10 MG tablet Take 10 mg by mouth at bedtime.  . metoprolol succinate (TOPROL-XL) 25 MG 24 hr  tablet Take 25 mg by mouth daily.  Marland Kitchen PARoxetine (PAXIL) 40 MG tablet Take 40 mg by mouth daily.  . prazosin (MINIPRESS) 2 MG capsule Take 2 mg by mouth at bedtime.  . primidone (MYSOLINE) 250 MG tablet Take 1 tablet (250 mg total) by mouth 2 (two) times daily.  Marland Kitchen REXULTI 3 MG TABS Take 1 tablet by mouth every morning.  . topiramate (TOPAMAX) 25 MG tablet Take 2 tablets in the morning  . valACYclovir (VALTREX) 500 MG tablet Take 500 mg by mouth daily.  Marland Kitchen VIAGRA 100 MG tablet    No facility-administered encounter medications on file as of 09/20/2016.     Behavioral Observations:   Appearance: Casually dressed and appropriately groomed Gait: Ambulated independently, no gross abnormalities observed Speech: Fluent; normal rate, rhythm and volume. Increased response latencies at times. No significant word finding difficulty. Thought process: Generally linear Affect: Full, generally euthymic Interpersonal: Pleasant, appropriate.  Patient arrived 20 minutes late to the appointment.   TESTING: There is medical necessity to proceed with neuropsychological assessment as the results will be used to aid in differential diagnosis and clinical decision-making and to inform specific treatment recommendations. Per the patient and medical records reviewed, there has been a change in cognitive functioning and a reasonable suspicion of neurocognitive disorder.  PLAN: The patient will return for a full battery of neuropsychological testing with a psychometrician under my supervision. Education regarding testing procedures was provided. Subsequently, the patient will see this provider for a follow-up session at which time his test performances and my impressions and treatment recommendations will be reviewed in detail.  Full neuropsychological evaluation report to follow.

## 2016-09-21 DIAGNOSIS — F332 Major depressive disorder, recurrent severe without psychotic features: Secondary | ICD-10-CM | POA: Diagnosis not present

## 2016-10-03 DIAGNOSIS — F332 Major depressive disorder, recurrent severe without psychotic features: Secondary | ICD-10-CM | POA: Diagnosis not present

## 2016-10-04 DIAGNOSIS — F332 Major depressive disorder, recurrent severe without psychotic features: Secondary | ICD-10-CM | POA: Diagnosis not present

## 2016-10-04 DIAGNOSIS — F902 Attention-deficit hyperactivity disorder, combined type: Secondary | ICD-10-CM | POA: Diagnosis not present

## 2016-10-06 ENCOUNTER — Encounter: Payer: Medicare Other | Admitting: Psychology

## 2016-10-26 ENCOUNTER — Ambulatory Visit (INDEPENDENT_AMBULATORY_CARE_PROVIDER_SITE_OTHER): Payer: Medicare Other | Admitting: Psychology

## 2016-10-26 DIAGNOSIS — R413 Other amnesia: Secondary | ICD-10-CM | POA: Diagnosis not present

## 2016-10-26 DIAGNOSIS — Z8673 Personal history of transient ischemic attack (TIA), and cerebral infarction without residual deficits: Secondary | ICD-10-CM

## 2016-10-26 NOTE — Progress Notes (Signed)
   Neuropsychology Note  [name] returned today for 3 hours of neuropsychological testing with technician, Milana Kidney, BS, under the supervision of Dr. Macarthur Critchley. The patient did not appear overtly distressed by the testing session, per behavioral observation or via self-report to the technician. Rest breaks were offered. [name] will return within 2 weeks for a feedback session with Dr. Si Raider at which time his test performances, clinical impressions and treatment recommendations will be reviewed in detail. The patient understands he can contact our office should he require our assistance before this time.  Full report to follow.

## 2016-10-31 DIAGNOSIS — F332 Major depressive disorder, recurrent severe without psychotic features: Secondary | ICD-10-CM | POA: Diagnosis not present

## 2016-11-14 DIAGNOSIS — F332 Major depressive disorder, recurrent severe without psychotic features: Secondary | ICD-10-CM | POA: Diagnosis not present

## 2016-11-27 ENCOUNTER — Emergency Department (HOSPITAL_COMMUNITY): Payer: Medicare Other

## 2016-11-27 ENCOUNTER — Other Ambulatory Visit: Payer: Self-pay

## 2016-11-27 ENCOUNTER — Encounter (HOSPITAL_COMMUNITY): Payer: Self-pay | Admitting: Emergency Medicine

## 2016-11-27 ENCOUNTER — Emergency Department (HOSPITAL_COMMUNITY)
Admission: EM | Admit: 2016-11-27 | Discharge: 2016-11-27 | Disposition: A | Discharge status: Home / Self Care | Payer: Medicare Other | Attending: Emergency Medicine | Admitting: Emergency Medicine

## 2016-11-27 DIAGNOSIS — Z79899 Other long term (current) drug therapy: Secondary | ICD-10-CM | POA: Insufficient documentation

## 2016-11-27 DIAGNOSIS — R519 Headache, unspecified: Secondary | ICD-10-CM

## 2016-11-27 DIAGNOSIS — Z7901 Long term (current) use of anticoagulants: Secondary | ICD-10-CM | POA: Diagnosis not present

## 2016-11-27 DIAGNOSIS — I10 Essential (primary) hypertension: Secondary | ICD-10-CM

## 2016-11-27 DIAGNOSIS — R51 Headache: Secondary | ICD-10-CM | POA: Insufficient documentation

## 2016-11-27 DIAGNOSIS — Z87891 Personal history of nicotine dependence: Secondary | ICD-10-CM | POA: Insufficient documentation

## 2016-11-27 LAB — CBC WITH DIFFERENTIAL/PLATELET
Basophils Absolute: 0 10*3/uL (ref 0.0–0.1)
Basophils Relative: 0 %
Eosinophils Absolute: 0.1 10*3/uL (ref 0.0–0.7)
Eosinophils Relative: 1 %
HCT: 43.8 % (ref 39.0–52.0)
Hemoglobin: 15.2 g/dL (ref 13.0–17.0)
Lymphocytes Relative: 25 %
Lymphs Abs: 1.6 10*3/uL (ref 0.7–4.0)
MCH: 32.6 pg (ref 26.0–34.0)
MCHC: 34.7 g/dL (ref 30.0–36.0)
MCV: 94 fL (ref 78.0–100.0)
Monocytes Absolute: 0.4 10*3/uL (ref 0.1–1.0)
Monocytes Relative: 6 %
Neutro Abs: 4.2 10*3/uL (ref 1.7–7.7)
Neutrophils Relative %: 68 %
Platelets: 299 10*3/uL (ref 150–400)
RBC: 4.66 MIL/uL (ref 4.22–5.81)
RDW: 13.3 % (ref 11.5–15.5)
WBC: 6.3 10*3/uL (ref 4.0–10.5)

## 2016-11-27 LAB — BASIC METABOLIC PANEL
Anion gap: 6 (ref 5–15)
BUN: 6 mg/dL (ref 6–20)
CO2: 24 mmol/L (ref 22–32)
Calcium: 8.9 mg/dL (ref 8.9–10.3)
Chloride: 108 mmol/L (ref 101–111)
Creatinine, Ser: 0.9 mg/dL (ref 0.61–1.24)
GFR calc Af Amer: >60 mL/min (ref >60)
GFR calc non Af Amer: >60 mL/min (ref >60)
Glucose, Bld: 100 mg/dL — ABNORMAL HIGH (ref 65–99)
Potassium: 3.5 mmol/L (ref 3.5–5.1)
Sodium: 138 mmol/L (ref 135–145)

## 2016-11-27 LAB — I-STAT CHEM 8, ED
BUN: 6 mg/dL (ref 6–20)
Calcium, Ion: 1.14 mmol/L — ABNORMAL LOW (ref 1.15–1.40)
Chloride: 106 mmol/L (ref 101–111)
Creatinine, Ser: 0.9 mg/dL (ref 0.61–1.24)
Glucose, Bld: 101 mg/dL — ABNORMAL HIGH (ref 65–99)
HCT: 45 % (ref 39.0–52.0)
Hemoglobin: 15.3 g/dL (ref 13.0–17.0)
Potassium: 3.4 mmol/L — ABNORMAL LOW (ref 3.5–5.1)
Sodium: 141 mmol/L (ref 135–145)
TCO2: 24 mmol/L (ref 22–32)

## 2016-11-27 MED ORDER — AMLODIPINE BESYLATE 5 MG PO TABS
5.0000 mg | ORAL_TABLET | Freq: Once | ORAL | Status: AC
  Administered 2016-11-27: 5 mg via ORAL
  Filled 2016-11-27: qty 1

## 2016-11-27 NOTE — ED Provider Notes (Signed)
[facility] Provider Note   CSN: [id number] Arrival date & time: 11/27/16  1748 History   Chief Complaint Chief Complaint  Patient presents with  . Hypertension  . Headache   HPI [name] is a 66 y.o. male with PMH of hypertension who presents with complaint of headache and hypertension.  Patient states that his headache started earlier today and hurt directly behind his eyes.  He states that he never has headaches and has never had a headache like this before.  He states that at this time he feels better and does not have a headache but that his blood pressure is still high.  He is poorly compliant with his hypertension medication.  He denies that anything makes his symptoms worse including smells, light, or sounds.  He denies that he has had any associated nausea, vomiting, or diarrhea.  He has not had any recent fever or chills.  He denies any associated neck pain.  He has not had any associated weakness or numbness.   HPI  Past Medical History:  Diagnosis Date  . Depression   . Frequent urination   . GERD (gastroesophageal reflux disease)   . Herpes   . Hyperlipidemia   . Hypertension   . Memory loss   . Obesity   . Stroke ([place])   . Urgency of urination     Patient Active Problem List   Diagnosis Date Noted  . History of stroke 11/11/2015  . Depression 10/16/2013  . Memory loss 10/16/2013  . Tremor 04/04/2013   Past Surgical History:  Procedure Laterality Date  . disckec    . SPINAL FUSION    . VASECTOMY      Home Medications    Prior to Admission medications   Medication Sig Start Date End Date Taking? Authorizing Provider  amLODipine (NORVASC) 10 MG tablet Take 10 mg by mouth daily.   Yes [provider]  Armodafinil 200 MG TABS Take 200 mg daily by mouth.  11/12/15  Yes [provider]  benztropine (COGENTIN) 0.5 MG tablet Take 0.5 mg 2 (two) times daily by mouth. 10/04/16  Yes [provider]  clindamycin (CLEOCIN) 150 MG capsule Take 600 mg See admin instructions by mouth. One hour prior to dental appointment 11/16/16  Yes [provider]  clopidogrel (PLAVIX) 75 MG tablet Take 1 tablet (75 mg total) by mouth daily. 11/11/15  Yes Dennie Bible, NP  diazepam (VALIUM) 5 MG tablet Take 5 mg daily as needed by mouth for anxiety.    Yes [provider]  Fluocinonide 0.1 % CREA APPLY 2-3 GRAMS TO AFFECTED AREAS 3-4 TIMES DAILY (1 GRAM = 1 DIME SIZE) 08/04/16  Yes [provider]  losartan (COZAAR) 100 MG tablet Take 100 mg daily by mouth.  11/24/14  Yes [provider]  lovastatin (MEVACOR) 10 MG tablet Take 10 mg by mouth at bedtime.   Yes [provider]  metoprolol succinate (TOPROL-XL) 25 MG 24 hr tablet Take 25 mg by mouth daily.   Yes [provider]  PARoxetine (PAXIL) 40 MG tablet Take 40 mg by mouth daily. 04/23/14  Yes [provider]  prazosin (MINIPRESS) 2 MG capsule Take 2 mg by mouth at bedtime.   Yes [provider]  primidone (MYSOLINE) 250 MG tablet Take 1 tablet (250 mg total) by mouth 2 (two) times daily. 04/20/16  Yes Garvin Fila, MD  REXULTI 3 MG TABS Take 3 mg every  morning by mouth.  05/19/14  Yes [provider]  topiramate (TOPAMAX) 25 MG tablet Take 2 tablets in the morning 09/12/16  Yes Garvin Fila, MD  valACYclovir (VALTREX) 500 MG tablet Take 500 mg by mouth daily. 05/06/14  Yes [provider]  VIAGRA 100 MG tablet Take 100 mg daily as needed by mouth for erectile dysfunction.  11/01/15  Yes [provider]   Family History Family History  Problem Relation Age of Onset  . Hypertension Mother   . Hypertension Father   . Hypertension Sister   . Hypertension Brother   . Cancer Maternal Aunt   . Cancer - Ovarian Maternal Grandmother   . Diabetes Mellitus I Paternal Grandmother   . Hypertension Paternal Grandfather   . Cancer Maternal Aunt     Social History Social History   Tobacco Use  . Smoking status: Former Research scientist (life sciences)  . Smokeless tobacco: Never Used  Substance Use Topics  . Alcohol use: No    Alcohol/week: 0.6 oz    Types: 1 Cans of beer per week    Comment: socially  . Drug use: No   Allergies   Aspirin; Lisinopril; Lithium; and Oxycodone  Review of Systems Review of Systems  Constitutional: Negative for chills and fever.  HENT: Negative for ear pain and sore throat.   Eyes: Negative for pain and visual disturbance.  Respiratory: Negative for cough and shortness of breath.   Cardiovascular: Negative for chest pain and palpitations.  Gastrointestinal: Negative for abdominal pain and vomiting.  Genitourinary: Negative for dysuria and hematuria.  Musculoskeletal: Negative for arthralgias and back pain.  Skin: Negative for color change and rash.  Neurological: Positive for headaches. Negative for seizures and syncope.  All other systems reviewed and are negative.  Physical Exam Updated Vital Signs BP (!) 156/104 (BP Location: Right Arm)   Pulse 61   Temp 98.6 F (37 C) (Oral)   Resp 15   Ht 5\' 7"  (1.702 m)   Wt 89.8 kg (198 lb)   SpO2 94%   BMI 31.01 kg/m   Physical Exam  Constitutional: He is oriented to person, place, and time. He appears well-developed and well-nourished.  HENT:  Head: Normocephalic and atraumatic.  Eyes: Conjunctivae are normal.  Neck: Neck supple.  Cardiovascular: Normal rate and regular rhythm.  No murmur heard. Pulmonary/Chest: Effort normal and breath sounds normal. No respiratory distress.  Abdominal: Soft. There is no tenderness.  Musculoskeletal: He exhibits no edema.  Neurological: He is alert and oriented to person, place, and time. He has normal strength. He displays normal reflexes. No cranial nerve deficit or sensory deficit. Coordination and gait normal. GCS eye subscore is 4. GCS verbal subscore is 5. GCS motor subscore is 6.  CN 2-12 normal, no facial droop 5/5  bilateral grip strength 5/5 bilateral flexion and extension of the wrist 5/5 bilateral bicep and triceps extension Normal strength and ROM of the bilateral lower extremities  Normal gait  Skin: Skin is warm and dry.  Psychiatric: He has a normal mood and affect.  Nursing note and vitals reviewed.  ED Treatments / Results  Labs (all labs ordered are listed, but only abnormal results are displayed) Labs Reviewed  BASIC METABOLIC PANEL - Abnormal; Notable for the following components:      Result Value   Glucose, Bld 100 (*)    All other components within normal limits  I-STAT CHEM 8, ED - Abnormal; Notable for the following components:   Potassium 3.4 (*)  Glucose, Bld 101 (*)    Calcium, Ion 1.14 (*)    All other components within normal limits  CBC WITH DIFFERENTIAL/PLATELET   EKG  EKG Interpretation None      Radiology Ct Head Wo Contrast  Result Date: 11/27/2016 CLINICAL DATA:  Headache and eye pain. No neurologic deficits. Hypertension. EXAM: CT HEAD WITHOUT CONTRAST TECHNIQUE: Contiguous axial images were obtained from the base of the skull through the vertex without intravenous contrast. COMPARISON:  None. FINDINGS: Brain: Ventricles, cisterns and other CSF spaces are normal. There is no mass, mass effect, shift of midline structures or acute hemorrhage. There is mild chronic ischemic microvascular disease. There is no evidence of acute infarction. Vascular: No hyperdense vessel or unexpected calcification. Skull: Normal. Negative for fracture or focal lesion. Sinuses/Orbits: Sinuses are well developed and well aerated without air-fluid level. Minimal mucosal membrane thickening over the right maxillary sinus. Mastoid air cells are clear. Orbits are normal. Other: None. IMPRESSION: No acute intracranial findings. Mild chronic ischemic microvascular disease. Very minimal chronic sinus inflammatory change. Electronically Signed   By: Marin Olp M.D.   On: 11/27/2016 19:46    Procedures Procedures (including critical care time)  Medications Ordered in ED Medications  amLODipine (NORVASC) tablet 5 mg (5 mg Oral Given 11/27/16 2132)   Initial Impression / Assessment and Plan / ED Course  I have reviewed the triage vital signs and the nursing notes.  Pertinent labs & imaging results that were available during my care of the patient were reviewed by me and considered in my medical decision making (see chart for details).  [name] is a 66 y.o. male who presented with headache and hypertension.   My initial assessment the patient has a normal neurological exam.  He is alert and oriented x3.  He is hemodynamically stable borderline bradycardic and hypertensive.   Is he is denying pain at this time we will not give anything for pain.  Due to patient's complaint of  new headache without history of prior will obtain CT scan of the head.  Lab studies CBC within normal limits, BMP within normal limits, normal renal function. CT head reveals no acute findings.  At this time we will not order chest x-ray as the patient does not have any associated shortness of breath, cough, or fevers.  At this time have no concerns for hypertensive emergency.  Patient's blood pressure has improved.  He is not currently symptomatic.  He has no signs of endorgan damage.  No concerns for meningitis or other infection as the patient has been afebrile and does not have signs or symptoms consistent with this.  Feel that subarachnoid is of low likelihood as the patient's symptoms came on gradually, and his CT scan is negative, and his symptoms have resolved.  At this time no concerns for TIA or stroke as patient's history and physical are not consistent with this.  He denies any associated chest pain or shortness of breath therefore we will not obtain troponins or EKG.  Given amlodipine in the emergency department with improvement of his blood pressure.  Discussed the patient's  imaging and lab studies with him and at this time feel that he is stable for discharge.  Advised patient to follow-up with his primary care provider and to take his medications as directed.  Patient states his understanding and feels safe and comfortable going home.  Final Clinical Impressions(s) / ED Diagnoses   Final diagnoses:  Hypertension, unspecified type  Acute nonintractable headache,  unspecified headache type   ED Discharge Orders    None       Fenton Foy, MD 11/29/16 [id number]    Dorie Rank, MD 11/30/16 [phone]

## 2016-11-27 NOTE — ED Triage Notes (Addendum)
Pt states he has hx of HTN does not take medication for this. Pt states he began having a head ache and eye pain. No neurological deficits, no weakness. Pt has hx of stroke in 2011 and states his short term memory has suffered since then. No speech changes states this is his normal speech.

## 2016-11-28 DIAGNOSIS — F332 Major depressive disorder, recurrent severe without psychotic features: Secondary | ICD-10-CM | POA: Diagnosis not present

## 2016-11-28 NOTE — ED Provider Notes (Signed)
Pt presented to the ED for evaluation of HTN and headache.   NO focal deficits on exam.  CT scan without signs of hemorrhage, mass, edema.   Oral hypertensive given.  Pt discharged in stable condition.  I saw and evaluated the patient, reviewed the resident's note and I agree with the findings and plan.     Dorie Rank, MD 11/28/16 [phone]

## 2016-12-04 DIAGNOSIS — I10 Essential (primary) hypertension: Secondary | ICD-10-CM | POA: Diagnosis not present

## 2016-12-04 DIAGNOSIS — R51 Headache: Secondary | ICD-10-CM | POA: Diagnosis not present

## 2016-12-12 DIAGNOSIS — F332 Major depressive disorder, recurrent severe without psychotic features: Secondary | ICD-10-CM | POA: Diagnosis not present

## 2016-12-13 NOTE — Progress Notes (Signed)
[facility]  PATIENT: [name] DOB: 1950-06-19   REASON FOR VISIT: Follow-up for essential tremor, history of stroke, mild cognitive impairment, depression HISTORY FROM: Patient alone at visit    [name] Leon with upper extremity fine action tremors likely anxiety related. Benign essential tremor is possible though less likely. History of right brain subcortical infarct due to small vessel disease in December 2011 with no significant residual deficits. History of peripheral neuropathy diagnosis but no real clinical symptoms or signs on exam or on nerve conduction/EMG exam.. Vascular risk factors of hypertension and hyperlipidemia. New 50 llbs weightloss in 1 year without determined cause.  He returns for followup after his last visit on 12/08/11. He states his noticed some improvement in his tremors after starting the primidone which he takes 100 mg daily. He seems to be tolerating it well without any drowsiness, dryness of mouth or other side effects. He states payment on is helping him more than intra-and is wondering if he can come off the Inderal. He has no new complaints. He had lab work done on 12/08/11 which showed normal thyroid function tests and LFTs. He's had no new stroke or TIA symptoms. He remains on Plavix and is tolerating it well without side effects. He states his blood pressure is under good control and it is 131/80 today.   Update 10/16/2013 : He returns for followup after his last visit 6 months ago. He continues to have mild tremors in his hands and is tolerating primadone 125 mg at night fairly well without significant side effects. He states the tremors do not affect his routine or date and activities. She has been complaining of some paresthesias and discomfort involving his head mainly on the left side for 3-4 weeks this is present mainly in the morning but does improve as the day goes along. He denies  headache or discomfort. Denies any sharp shooting pain or any vision loss. He has been complaining of some progressive memory difficulties. He does get lost easily and has to rely on his GPS. He forgets appointments and even really writes things down. He does have long-standing depression and does take medications for that. Update 2/2/16PS :He returns for follow-up after last visit 4 months ago. He reports slight worsening of his left hand tremor which at times now interferes with his activities of daily living during the day as well. He has not tried a higher dose of Mysoline be on 125 mg at bedtime yet but is willing to do so. He states his memory difficulties are unchanged. He remains compliant with his depression medications and sees a psychiatrist regularly. He had lab work done at last visit on 10/16/13 which showed normal vitamin B12, TSH. RPR and HIV antibodies were negative. Carotid ultrasound done on 10/30/13 personally reviewed by me shows no significant extracranial stenosis. Update 10/28/2016PS : He returns for follow-up after last visit 8 months ago. Is accompanied by a friend [name] Leon. He has noticed significant worsening of his memory particularly short-term. This has been gradual over several years but more recently. The patient states that he has been regularly seeing a psychiatrist [name] Leon on a monthly basis and he is on Paxil 40 mg a day. On Mini-Mental status testing today scored 21/30 which is significant decline from 28/30 at last visit. Patient however remains independent in activities of daily living. He lives alone. He states his hand tremors are quite minimal and not bothersome. He remains on  primidone 125 mg twice daily. He denies any recent headaches, falls or head injuries. Prior to last visit he had vitamin B12, TSH and RPR tested which were normal. Update 1/5/2017PS: He returns for follow-up after last visit to 2 months ago. Is accompanied by his cousin [name] Leon. Patient has  noticed improvement in his memory and cognition. He has been compliant with his depression medication which is taking without fail. Today scored 27/30 compared to 21/30 at last visit. He is independent in activities of daily living. He was seen by a psychiatrist's [name] Leon who felt his depression was adequately treated and was concerned about his memory was actually getting worse. Patient himself actually denies this. He had EEG done on 01/08/15 which was normal which I personally reviewed and MRI scan of the brain on 12/04/14 which showed mild changes of chronic microvascular ischemia and atrophy which is unchanged compared with previous MRI. Update 8/4/2017PS : He returns for follow-up after last visit 7 months ago. He states his noticed worsening of his tremors. They involve mostly his hands but also at times his feet. He has trouble doing activities like holding a cup or newspaper. He can get some concentration. His tremors. He has been taking Mysoline 125 mg twice daily for his long-standing essential tremor but feels he needs more medicine now.  Patient states his memory difficulties remain unchanged and are not progressive. He feels his depression is adequately treated but on geriatric depression scale today scored 8 suggestive of mild depression. On [place] he scored23/30 with deficits in recall and orientation.  UPDATE 10/25/2017CM [name] 66 year old Leon returns for follow-up. He has a history of stroke event in 2011 and was on Plavix until about a year ago. When asked if he can take aspirin he claims he had nausea and vomiting balance issues and dizziness from aspirin. He also has a history of essential tremor and is currently on Mysoline 250 at night he is not taking the morning dose and  his tremor is worse during the day he claims however I see very little tremor on exam. Could be related to anxiety. His memory difficulties are stable and his MOCA was not repeated today. He returns for  reevaluation previous records reviewed UPDATE 11/16/2017CM [name] Leon, 66 year old Leon returns for follow-up. He was just seen the end of October. He has history of stroke event in 2011 and tremor. He has been on Mysoline which was increased at his last visit however he cannot see much improvement. He thinks his tremor is worse when sitting still, no difficulty specifically with eating. Has history of depression and see psychiatry. Mild resting tremor noted today on the right very intermittent no essential tremor. Says his sense of smell, comes and goes. He has had no gait difficulty or falls. He returns for reevaluation Update 03/14/2016 : He returns for follow-up after last visit 3 months ago. He had not noticed significant benefit of increasing the dose of Mysoline to 250 twice daily. He was asked to taper off and discontinue Mysoline instructed take Sinemet by Cecille Rubin nurse practitioner however the patient for unclear reason has chosen not to do it. He remains on Topamax 75 mg daily as well as Mysoline to 50 twice daily. Patient states that he caught driving under the influence of medications and has lost his driving license. He does complain of sleepiness and tiredness during the day. He remains on Plavix for stroke prevention is tolerating it well without side effects. He states his blood  pressure well controlled today was 131/82. Hie has  no further stroke or TIA symptoms. He states his eye memory difficulties unchanged and not getting worse. Update 09/12/2016 PS; he returns for follow-up after last visit 6 months ago. Patient states that increasing the Topamax has not helped and in fact he feels his memory difficulties have gotten worse. He does have an appointment to do neuropsych testing next month. He was supposed to be on Topamax 75 twice daily but in fact has been taking it only once a day. He states his tremors are also worse particularly when he is anxious. He was meant to be on Mysoline 250  mg twice daily but has been taking it only once daily. Patient does see a psychiatrist [name] Leon for his depression which he feels is adequately controlled. He denies any recurrent symptoms of stroke or TIAs. He is tolerating Plavix well without bleeding or bruising. He states his blood pressure is well controlled and today it is 123/80. He is tolerating Mevacor well without muscle aches and pains. He cannot tell me when her last lipid profile was checked. UPDATE 11/28/2018CM Mr. Maltos, 66 year old Leon returns for follow-up.  He has a history of mild cognitive impairment which he feels is better.  He has been evaluated by neuropsych but has not had follow-up appointments for results.  He sees [name] Leon for depression.  He is currently on Mysoline 250 twice daily for tremor which is well controlled.  He had tapered down on his Topamax at last visit to 50 mg due to cognitive difficulties.  He remains on Plavix for secondary stroke prevention.  He has no bruising and no bleeding.  He has not had further stroke or TIA symptoms.  Blood pressure in the office today 146/92 he claims he is compliant with his blood pressure medications.  He is tolerating Mevacor without muscle aches.  He returns for reevaluation  REVIEW OF SYSTEMS: Full 14 system review of systems performed and notable only for those listed, all others are neg:  Constitutional: neg  Cardiovascular: neg Ear/Nose/Throat: neg  Skin: neg Eyes: neg Respiratory: neg Gastroitestinal: neg  Hematology/Lymphatic: neg  Endocrine: neg Musculoskeletal:neg Allergy/Immunology: neg Neurological: History of stroke, essential tremor Psychiatric: Depression Sleep : neg   ALLERGIES: Allergies  Allergen Reactions  . Aspirin Other (See Comments)    Dizziness, nausea and vomiting Balance issues  . Lisinopril Other (See Comments)    Critical Cough  . Lithium Other (See Comments)    Critical Per pt reaction unknown  . Oxycodone Nausea And Vomiting      HOME MEDICATIONS: Outpatient Medications Prior to Visit  Medication Sig Dispense Refill  . amLODipine (NORVASC) 10 MG tablet Take 10 mg by mouth daily.    . Armodafinil 200 MG TABS Take 200 mg daily by mouth.     . benztropine (COGENTIN) 0.5 MG tablet Take 0.5 mg 2 (two) times daily by mouth.  0  . clopidogrel (PLAVIX) 75 MG tablet Take 1 tablet (75 mg total) by mouth daily. 30 tablet 11  . diazepam (VALIUM) 5 MG tablet Take 5 mg daily as needed by mouth for anxiety.     Marland Kitchen losartan (COZAAR) 100 MG tablet Take 100 mg daily by mouth.     . lovastatin (MEVACOR) 10 MG tablet Take 10 mg by mouth at bedtime.    . metoprolol succinate (TOPROL-XL) 25 MG 24 hr tablet Take 25 mg by mouth daily.    Marland Kitchen PARoxetine (PAXIL) 40 MG tablet Take  40 mg by mouth daily.  1  . prazosin (MINIPRESS) 2 MG capsule Take 2 mg by mouth at bedtime.    . primidone (MYSOLINE) 250 MG tablet Take 1 tablet (250 mg total) by mouth 2 (two) times daily. 60 tablet 11  . REXULTI 3 MG TABS Take 3 mg every morning by mouth.   0  . topiramate (TOPAMAX) 25 MG tablet Take 2 tablets in the morning 180 tablet 6  . Fluocinonide 0.1 % CREA APPLY 2-3 GRAMS TO AFFECTED AREAS 3-4 TIMES DAILY (1 GRAM = 1 DIME SIZE)  3  . valACYclovir (VALTREX) 500 MG tablet Take 500 mg by mouth daily.  11  . VIAGRA 100 MG tablet Take 100 mg daily as needed by mouth for erectile dysfunction.     . clindamycin (CLEOCIN) 150 MG capsule Take 600 mg See admin instructions by mouth. One hour prior to dental appointment  0   No facility-administered medications prior to visit.     PAST MEDICAL HISTORY: Past Medical History:  Diagnosis Date  . Depression   . Frequent urination   . GERD (gastroesophageal reflux disease)   . Herpes   . Hyperlipidemia   . Hypertension   . Memory loss   . Obesity   . Stroke ([place])   . Urgency of urination     PAST SURGICAL HISTORY: Past Surgical History:  Procedure Laterality Date  . disckec    . SPINAL FUSION    .  VASECTOMY      FAMILY HISTORY: Family History  Problem Relation Age of Onset  . Hypertension Mother   . Hypertension Father   . Hypertension Sister   . Hypertension Brother   . Cancer Maternal Aunt   . Cancer - Ovarian Maternal Grandmother   . Diabetes Mellitus I Paternal Grandmother   . Hypertension Paternal Grandfather   . Cancer Maternal Aunt     SOCIAL HISTORY: Social History   Socioeconomic History  . Marital status: Legally Separated    Spouse name: Not on file  . Number of children: 1  . Years of education: Masters  . Highest education level: Not on file  Social Needs  . Financial resource strain: Not on file  . Food insecurity - worry: Not on file  . Food insecurity - inability: Not on file  . Transportation needs - medical: Not on file  . Transportation needs - non-medical: Not on file  Occupational History  . Occupation: retired    Fish farm manager: RETIRED  Tobacco Use  . Smoking status: Former Research scientist (life sciences)  . Smokeless tobacco: Never Used  Substance and Sexual Activity  . Alcohol use: No    Alcohol/week: 0.6 oz    Types: 1 Cans of beer per week    Comment: socially  . Drug use: No  . Sexual activity: No  Other Topics Concern  . Not on file  Social History Narrative   Patient is separated.   Patient lives alone and has one child.   Patient is retired.   Patient is right-handed.   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   Patient does not drink any caffeine.     PHYSICAL EXAM  Vitals:   12/14/16 1055  BP: (!) 146/92  Pulse: 83  Weight: 210 lb (95.3 kg)   Body mass index is 32.89 kg/m.  Generalized: Well developed, obese Leon in no acute distress  Head: normocephalic and atraumatic,. Oropharynx benign  Neck: Supple, no carotid bruits  Cardiac: Regular rate rhythm, no murmur  Musculoskeletal: No deformity   Neurological examination   Mentation: Alert AFT 12. Clock drawing 4/4 MMSE - Mini Mental State Exam 12/14/2016 09/12/2016 11/14/2014  Orientation  to time 4 4 3   Orientation to Place 5 5 5   Registration 3 3 3   Attention/ Calculation 5 2 2   Recall 3 3 1   Language- name 2 objects 2 2 2   Language- repeat 1 1 1   Language- follow 3 step command 2 3 3   Language- follow 3 step command-comments used left hand - -  Language- read & follow direction 1 1 1   Write a sentence 1 1 0  Copy design 1 1 0  Total score 28 26 21     Follows all commands speech and language fluent.   Cranial nerve II-XII: Pupils were equal round reactive to light extraocular movements were full, visual field were full on confrontational test. Facial sensation and strength were normal. hearing was intact to finger rubbing bilaterally. Uvula tongue midline. head turning and shoulder shrug were normal and symmetric.Tongue protrusion into cheek strength was normal. Motor: normal bulk and tone, full strength in the BUE, BLE, fine finger movements normal, no pronator drift. No focal weakness, no tremor Sensory: normal and symmetric to light touch, in the upper and lower extremities   Coordination: finger-nose-finger, heel-to-shin bilaterally, no dysmetria Reflexes: 1+ upper lower and symmetric plantar responses were flexor bilaterally. Gait and Station: Rising up from seated position without assistance, normal stance,  moderate stride, good arm swing, smooth turning, able to perform tiptoe, and heel walking without difficulty. Tandem gait is steady  DIAGNOSTIC DATA (LABS, IMAGING, TESTING) - I reviewed patient records, labs, notes, testing and imaging myself where available.  Lab Results  Component Value Date   WBC 6.3 11/27/2016   HGB 15.3 11/27/2016   HCT 45.0 11/27/2016   MCV 94.0 11/27/2016   PLT 299 11/27/2016      Component Value Date/Time   NA 141 11/27/2016 2004   K 3.4 (L) 11/27/2016 2004   CL 106 11/27/2016 2004   CO2 24 11/27/2016 1949   GLUCOSE 101 (H) 11/27/2016 2004   BUN 6 11/27/2016 2004   CREATININE 0.90 11/27/2016 2004   CALCIUM 8.9 11/27/2016  1949   PROT 7.3 05/29/2014 1503   ALBUMIN 4.7 05/29/2014 1503   AST 15 05/29/2014 1503   ALT 10 05/29/2014 1503   ALKPHOS 41 05/29/2014 1503   BILITOT 0.3 05/29/2014 1503   GFRNONAA >60 11/27/2016 1949   GFRAA >60 11/27/2016 1949   Lab Results  Component Value Date   CHOL 150 05/29/2014   HDL 30 (L) 05/29/2014   LDLCALC 63 05/29/2014   TRIG 284 (H) 05/29/2014   CHOLHDL 5.0 05/29/2014   Lab Results  Component Value Date   HGBA1C 5.5 05/29/2014   Lab Results  Component Value Date   VITAMINB12 886 10/16/2013   Lab Results  Component Value Date   TSH 2.850 10/16/2013      ASSESSMENT AND PLAN 42 year African American Leon with upper extremity resting tremor intermittently,  likely anxiety related.  History of right brain subcortical infarct due to small vessel disease in December 2011 with no significant residual deficits. History of peripheral neuropathy diagnosis but no real clinical symptoms or signs on exam or on nerve conduction/EMG exam. Vascular risk factors of hypertension and hyperlipidemia. Mild memory difficulties likely due to underlying  depression versus early dementia which appears stable.  Neuropsych testing has been done but he has not returned for the results.The  patient is a current patient of Dr. [name]  who is out of the office today . This note is sent to the work in doctor.    PLAN:  Tremor is well controlled continue primidone 250 mg twice daily May decrease Topamax to 25 mg(1 tab) for 2 weeks then discontinue Continue follow-up with psychiatry and compliance with depressive medications Continue Mevacor for hyperlipidemia, labs followed by primary care Continue Plavix for secondary stroke prevention Blood pressure in the office today 146/92 continue antihypertensive medications with goal of blood pressure below 130/90 Follow-up appointment for neuropsych testing results on December 4 Follow-up in 6 months Dennie Bible, [facility], [facility], [facility] [address], [place], [place] [address] [phone]

## 2016-12-14 ENCOUNTER — Ambulatory Visit (INDEPENDENT_AMBULATORY_CARE_PROVIDER_SITE_OTHER): Payer: Medicare Other | Admitting: Nurse Practitioner

## 2016-12-14 ENCOUNTER — Encounter: Payer: Self-pay | Admitting: Nurse Practitioner

## 2016-12-14 VITALS — BP 146/92 | HR 83 | Wt 210.0 lb

## 2016-12-14 DIAGNOSIS — Z8673 Personal history of transient ischemic attack (TIA), and cerebral infarction without residual deficits: Secondary | ICD-10-CM

## 2016-12-14 DIAGNOSIS — R413 Other amnesia: Secondary | ICD-10-CM | POA: Diagnosis not present

## 2016-12-14 DIAGNOSIS — R251 Tremor, unspecified: Secondary | ICD-10-CM | POA: Diagnosis not present

## 2016-12-14 NOTE — Patient Instructions (Signed)
Tremor is well controlled continue primidone 250 mg twice daily May decrease Topamax to 25 mg(1 tab) for 2 weeks then discontinue Continue follow-up with psychiatry and compliance with depressive medications Continue Plavix for secondary stroke prevention Blood pressure in the office today 146/92 continue antihypertensive medications with goal of blood pressure below 130/90 Follow-up in 6 months

## 2016-12-18 NOTE — Progress Notes (Signed)
NEUROPSYCHOLOGICAL EVALUATION   Name:    [name]  Date of Birth:   05/29/1950 Date of Interview:  09/20/2016 Date of Testing:  10/26/2016   Date of Feedback:  12/20/2016       Background Information:  Reason for Referral:  [name] is a 66 y.o. male referred by Cecille Rubin, NP, and Dr. Antony Contras of [facility] to assess his current level of cognitive functioning and assist in differential diagnosis. The current evaluation consisted of a review of available medical records, an interview with the patient and the completion of a neuropsychological testing battery. Informed consent was obtained.  History of Presenting Problem:  [name] has been followed by [facility] since 2015 for UE action tremor and subjective memory complaints. Records note a history of right brain subcortical infarct due to small vessel disease in December 2011 with no significant residual deficits. According to record review, it appears that the patient complained of worsening memory at some visits and reported improved memory at other visits. MoCA was 23/30 on 08/21/2015. In February 2018, he reported that he was caught driving under the influence of medication and had lost his driver's license. In March 2018, his psychotherapist, [name], requested that [facility] refer him for neuropsychological testing as she had been working with him for 10 years and was increasingly concerned about worsening memory issues. He is also followed by [name], psychiatrist, for medication management. The patient was last seen by Dr. [name] on 09/12/2016. He was taking Topamax and primidone differently than prescribed (taking less than instructed). He reported worsening memory and worsening tremor. He scored 26/30 on MMSE. He was unable to do the clock drawing test. He was advised to decrease Topamax to 50 mg daily and advised to increase primidone to 250 mg twice daily. At today's visit (09/20/2016), the patient reports that  he is complying with these medication instructions from Dr. [name].   At today's visit (09/20/2016), the patient reports that he has had memory difficulties for the past 7 years or so. He initially says he does not think this coincided with his history of TIA/stroke, but then states that maybe it does. He feels the memory difficulties have been gradually worsening over time. Nervousness makes it worse. Nervousness also makes his tremor worse, but he still experiences the tremor even when he is not nervous. He reported that Nuvigil helps "open up my brain", but it does not help as much as Provigil which he was prescribed in the past.  He complains of difficulty concentrating and remembering things. Today he cannot recall the name of a medication he is taking even though he could remember it earlier this morning.  Upon direct questioning, the patient reported the following with regard to current cognitive functioning:   Forgetting recent conversations/events: Yes Misplacing/losing items: No Forgetting appointments or other obligations: No, I write them down (Note: he was 20 min late to appointment today) Forgetting to take medications: No, I write them down and use pillbox Starting but not finishing tasks: No Processing information more slowly: Yes Word-finding difficulty: Yes Spelling difficulty: Yes Comprehension difficulty: Yes Getting lost when driving: Yes this has happened Uncertain about directions when driving or passenger: Yes, relies on navigation system in his car.  There is no family history of dementia. There is no family history of tremor or Parkinson's disease.   [name] lives alone. He manages all complex ADLs independently including driving, medications, finances, appointments and meals.  He denies any difficulty with walking or balance.   He denies history of head injury.   He reports that he has longstanding sleep difficulty. He has never been diagnosed with  sleep apnea or REM sleep behavior disorder. He sleeps alone and does not know if he acts out dreams or moves around a lot in his sleep.  His appetite is reduced and he has been eating less. He has lost weight.    Psychiatric History: The patient reported a long history of depression with onset immediately following a neck surgery. He denied any psychiatric history prior to that surgery. He reported that he has had multiple inpatient psychiatry hospitalizations due to suicidal ideation. He reported he has never attempted suicide. He denied history of psychosis. He denied any recent suicidal ideation. He sees his therapist and psychiatrist regularly. He reported that his current mood is "good". He admitted that his patience is "thin" now, "especially with this whole evaluation process".   He denied history of substance abuse or dependence.   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  Marital history: Divorced with 60yo daughter Alcohol: None Tobacco: Former, quit >20 years ago Denies any current substance abuse.   Medical History:  Past Medical History:  Diagnosis Date  . Depression   . Frequent urination   . GERD (gastroesophageal reflux disease)   . Herpes   . Hyperlipidemia   . Hypertension   . Memory loss   . Obesity   . Stroke ([place])   . Urgency of urination     Current medications:  Outpatient Encounter Medications as of 12/20/2016  Medication Sig  . amLODipine (NORVASC) 10 MG tablet Take 10 mg by mouth daily.  . Armodafinil 200 MG TABS Take 200 mg daily by mouth.   . benztropine (COGENTIN) 0.5 MG tablet Take 0.5 mg 2 (two) times daily by mouth.  . clopidogrel (PLAVIX) 75 MG tablet Take 1 tablet (75  mg total) by mouth daily.  . diazepam (VALIUM) 5 MG tablet Take 5 mg daily as needed by mouth for anxiety.   . Fluocinonide 0.1 % CREA APPLY 2-3 GRAMS TO AFFECTED AREAS 3-4 TIMES DAILY (1 GRAM = 1 DIME SIZE)  . losartan (COZAAR) 100 MG tablet Take 100 mg daily by mouth.   . lovastatin (MEVACOR) 10 MG tablet Take 10 mg by mouth at bedtime.  . metoprolol succinate (TOPROL-XL) 25 MG 24 hr tablet Take 25 mg by mouth daily.  Marland Kitchen PARoxetine (PAXIL) 40 MG tablet Take 40 mg by mouth daily.  . prazosin (MINIPRESS) 2 MG capsule Take 2 mg by mouth at bedtime.  . primidone (MYSOLINE) 250 MG tablet Take 1 tablet (250 mg total) by mouth 2 (two) times daily.  Marland Kitchen REXULTI 3 MG TABS Take 3 mg every morning by mouth.   . topiramate (TOPAMAX) 25 MG tablet Take 2 tablets in the morning (Patient taking differently: 1 tab for 2 weeks then d/c)  . valACYclovir (VALTREX) 500 MG tablet Take 500 mg by mouth daily.  Marland Kitchen VIAGRA 100 MG tablet Take 100 mg daily as needed by mouth for erectile dysfunction.    No facility-administered encounter medications on file as of 12/20/2016.      Current Examination:  Behavioral Observations:  Appearance: Casually dressed  and appropriately groomed. At his follow up appointment on 12/20/2016, he demonstrated repetitive mouth movement suggestive of tardive dyskinesia.  Gait: Ambulated independently, no gross abnormalities observed Speech: Fluent; normal rate, rhythm and volume. Increased response latencies at times. No significant word finding difficulty. Thought process: Generally linear Affect: Full, generally euthymic Interpersonal: Pleasant, appropriate.  Patient arrived 20 minutes late to the interview appointment and did not show for his first scheduled testing appointment. Orientation: Oriented to person, place and most aspects of time (did not know the current date). Accurately named the current President but could not recall his predecessor.  Tests Administered: . Test of  Premorbid Functioning (TOPF) . Wechsler Adult Intelligence Scale-Fourth Edition (WAIS-IV): Similarities, Block Design, Arithmetic, Matrix Reasoning, Coding, Symbol Search and Digit Span subtests . Wechsler Memory Scale-Fourth Edition (WMS-IV)  Adult Version (ages 44-69): Logical Memory I, II and Recognition subtests  . [employer] Verbal Learning Test - 2nd Edition (CVLT-2) Short Form . [employer] (WCST) . Repeatable Battery for the Assessment of Neuropsychological Status (RBANS) Form A:  Figure Copy and Recall subtests and Semantic Fluency subtest . Boston Naming Test (BNT) . Boston Diagnostic Aphasia Examination: Commands subtest . Controlled Oral Word Association Test (COWAT) . Trail Making Test A and B . Symbol Digit Modalities Test (SDMT) - Oral only . Clock drawing test . Beck Depression Inventory - 2nd Edition (BDI-II) . Generalized Anxiety Disorder - 7 item screener (GAD-7) . Green's WMT  Test Results: Note: Standardized scores are presented only for use by appropriately trained professionals and to allow for any future test-retest comparison. These scores should not be interpreted without consideration of all the information that is contained in the rest of the report. The most recent standardization samples from the test publisher or other sources were used whenever possible to derive standard scores; scores were corrected for age, gender, ethnicity and education when available.  Also note: The following test performances may not provide a completely valid estimate of Mr. Levenhagen's current neuropsychological functioning as there was evidence of variable effort to engage in the cognitive tests. True abilities are thought to be of at least the level reported here and deficits cannot be assumed to be genuine.  Test Scores:  Test Name Raw Score Standardized Score Descriptor  TOPF 34/70 SS= 93 Average  WAIS-IV Subtests     Similarities 20/36 ss= 8 Low end of average    Block Design 24/66 ss= 7 Low average  Arithmetic 9/22 ss= 6 Low average  Matrix Reasoning 10/26 ss= 7 Low average  Coding 40/135 ss= 6 Low average  Symbol Search 11/60 ss= 3 Impaired  Digit Span  20/48 ss= 7 Low average  WAIS-IV Index Scores     Working Memory  SS= 80 Low average  Processing Speed  SS= 71 Borderline  WMS-IV Subtests     LM I 18/50 ss= 7 Low average  LM II 14/50 ss= 7 Low average  LM II Recognition 19/30 Cum %: 3-9 Impaired  RBANS Subtests     Figure Copy 20/20 Z= 1.1 High average  Figure Recall 6/20 Z= -1.9 Borderline  Semantic Fluency 6/40 Z= -3.3 Severely impaired  CVLT-II Scores     Trial 1 4/9 Z= -1.5 Borderline  Trial 4 6/9 Z= -1 Low average  Trials 1-4 total 19/36 T= 36 Borderline  SD Free Recall 4/9 Z= -1.5 Borderline  LD Free Recall 1/9 Z= -2 Impaired  LD Cued Recall 1/9 Z= -3 Severely impaired  Recognition Hits 2/9 Z= -5  Severely impaired  Recognition False Positives 1 Z=0 Average  Forced Choice Recognition 9/9  WNL  WCST     Total errors 34 T= 29 Impaired  Perseverative Responses 14 T= 37 Low average  Perseverative errors 12 T= 39 Low average  Conceptual Level Responses 13 T= 25 Impaired  Categories completed 1 6-10% Below expectation  Trials to complete 1st category 14 >16% WNL  Failure to Maintain Set 0  WNL  BNT 49/60 T= 31 Borderline  BDAE Subtest     Commands 15/15  WNL  COWAT-FAS 16 T= 29 Impaired  COWAT-Animals 10 T= 30 Impaired  Trail Making Test A  49" 1 error T= 39 Low average  Trail Making Test B  233" 1 error T= 11 Severely impaired  SDMT     Oral  Z= -2.1 Impaired  Clock Drawing   WNL  BDI-II 6/63  WNL  GAD-7 5/21  Mild      Description of Test Results:  Embedded performance validity indicators revealed variable levels of effort, and the patient demonstrated below chance performances on a test of memory malingering. As such, the patient's current performance on neurocognitive testing may not accurately reflect his true  cognitive abilities. The following description is provided only to characterize his performances on the tests administered, but interpretation of such results is limited given the possibility of diminished effort.  Premorbid verbal intellectual abilities were estimated to have been within the average range based on a test of word reading. Psychomotor processing speed was borderline impaired. Removing the motor component did not improve performance. Auditory attention and working memory were low average. Visual-spatial construction was variable but within normal limits. Specifically, his manipulation of three dimensional blocks to match two dimensional stimuli was low average, while his drawn copy of a complex geometric figure was high average. Language abilities were variable. Specifically, confrontation naming was borderline, and semantic verbal fluency was impaired to severely impaired, while auditory comprehension of multi-step commands was intact. With regard to verbal memory, encoding and acquisition of non-contextual information (i.e., word list) was borderline. After a brief distracter task, free recall was borderline (4/9 items recalled). After a delay, free recall was impaired (1/9 items recalled). Cued recall was severely impaired (1/9 items recalled). Performance on a yes/no recognition task was severely impaired due to very low recognition of target items. On another verbal memory test, encoding and acquisition of contextual auditory information (i.e., short stories) was low average. After a delay, free recall was low average. Performance on a yes/no recognition task was impaired. With regard to non-verbal memory, delayed free recall of visual information was borderline. Performances across tests measuring multiple aspects of executive functioning were somewhat variable. Mental flexibility and set-shifting were severely impaired on Trails B. Verbal fluency with phonemic search restrictions was  impaired. Verbal abstract reasoning was average. Non-verbal abstract reasoning was low average. Deductive reasoning and problem solving was impaired. Performance on a clock drawing task was normal.   On a self-report measure of mood, the patient's responses were not indicative of clinically significant depression at the present time. On a self-report measure of anxiety, the patient endorsed mild generalized anxiety.    Clinical Impressions: Diagnosis deferred. The patient's performance on a stand-alone test of memory malingering suggested the possibility of poor effort, and he demonstrated variable performances on embedded effort measures. Cognitive testing results were globally below expectation based on estimated premorbid abilities. However, given the possibility of poor effort, it is difficult to know whether these test performances  accurately reflect his genuine abilities and daily functioning. These test results are certainly inconsistent with cognitive screening completed at [facility] just recently on 12/14/2016 (MMSE was 28/30 on that date), suggesting that he is not experiencing gross or severe neurocognitive dysfunction. It would be very unusual for a patient with dementia to perform as poorly as he did on neurocognitive testing and then achieve 28/30 on a subsequent MMSE. While I cannot entirely rule out an underlying neurocognitive disorder, this unfortunately cannot be definitively ascertained from the current evaluation. I suspect a primary psychiatric disorder may be playing a role in his cognitive complaints. The patient denied significant depression or anxiety at the present time, but he does have a history of severe depression with multiple inpatient psychiatric hospitalizations. Personality testing was not completed due to the patient's reticence to do this type of testing, but further exploration of primary psychiatric disorder contributing to his current complaints is certainly warranted if  possible.    Feedback to Patient: [name] returned for a feedback appointment on 12/20/2016 to review the results of his neuropsychological evaluation with this provider. 20 minutes face-to-face time was spent reviewing his test results and my impressions as detailed above. I explained that clinical impressions are limited due to variable levels of attention/effort during the testing process, but that based on all the available data at this time, it is most likely that he does not have dementia at this time. He was encouraged to continue mental health treatment. He signed a release for me to send this report to his therapist, [employer].   Total time spent on this patient's case: [id number] unit for interview with psychologist; [phone] units of testing by psychometrician under psychologist's supervision; [phone] units for medical record review, scoring of neuropsychological tests, interpretation of test results, preparation of this report, and review of results to the patient by psychologist.      Thank you for your referral of [facility]. Please feel free to contact me if you have any questions or concerns regarding this report.

## 2016-12-19 ENCOUNTER — Other Ambulatory Visit: Payer: Self-pay | Admitting: Neurology

## 2016-12-19 DIAGNOSIS — G250 Essential tremor: Secondary | ICD-10-CM

## 2016-12-20 ENCOUNTER — Encounter: Payer: Self-pay | Admitting: Psychology

## 2016-12-20 ENCOUNTER — Ambulatory Visit (INDEPENDENT_AMBULATORY_CARE_PROVIDER_SITE_OTHER): Payer: Medicare Other | Admitting: Psychology

## 2016-12-20 DIAGNOSIS — R413 Other amnesia: Secondary | ICD-10-CM | POA: Diagnosis not present

## 2016-12-20 NOTE — Patient Instructions (Addendum)
As we discussed, I don't see evidence of an underlying dementia or Alzheimer's disease. It is possible that your high blood pressure is contributing to the cognitive problems you are noticing. Also, if your blood sugars are getting too high or too low at any point, that can cause acute confusion.  Finally, depression and anxiety can contribute to memory difficulties as well. I definitely recommend that you continue mental health treatment. If you feel that your memory problems worsen over time or you have new cognitive concerns, we can do a re-evaluation in the future.

## 2017-01-03 DIAGNOSIS — H6982 Other specified disorders of Eustachian tube, left ear: Secondary | ICD-10-CM | POA: Diagnosis not present

## 2017-01-04 ENCOUNTER — Telehealth: Payer: Self-pay | Admitting: Nurse Practitioner

## 2017-01-04 NOTE — Telephone Encounter (Signed)
I called and spoke with patient and he is aware that he is to continue taking the primidone per last OV note.

## 2017-01-04 NOTE — Telephone Encounter (Signed)
Patient calling to find out if he is still supposed to take primidone (MYSOLINE) 250 MG tablet.

## 2017-01-18 DIAGNOSIS — F332 Major depressive disorder, recurrent severe without psychotic features: Secondary | ICD-10-CM | POA: Diagnosis not present

## 2017-01-19 DIAGNOSIS — M26609 Unspecified temporomandibular joint disorder, unspecified side: Secondary | ICD-10-CM | POA: Diagnosis not present

## 2017-01-30 DIAGNOSIS — F332 Major depressive disorder, recurrent severe without psychotic features: Secondary | ICD-10-CM | POA: Diagnosis not present

## 2017-01-30 DIAGNOSIS — H9203 Otalgia, bilateral: Secondary | ICD-10-CM | POA: Diagnosis not present

## 2017-01-30 DIAGNOSIS — H903 Sensorineural hearing loss, bilateral: Secondary | ICD-10-CM | POA: Diagnosis not present

## 2017-01-31 ENCOUNTER — Encounter: Payer: Medicare Other | Admitting: Psychology

## 2017-02-08 DIAGNOSIS — M25532 Pain in left wrist: Secondary | ICD-10-CM | POA: Diagnosis not present

## 2017-02-13 DIAGNOSIS — F332 Major depressive disorder, recurrent severe without psychotic features: Secondary | ICD-10-CM | POA: Diagnosis not present

## 2017-02-22 DIAGNOSIS — M25332 Other instability, left wrist: Secondary | ICD-10-CM | POA: Diagnosis not present

## 2017-02-22 DIAGNOSIS — M19031 Primary osteoarthritis, right wrist: Secondary | ICD-10-CM | POA: Insufficient documentation

## 2017-02-22 DIAGNOSIS — M1812 Unilateral primary osteoarthritis of first carpometacarpal joint, left hand: Secondary | ICD-10-CM | POA: Diagnosis not present

## 2017-02-27 DIAGNOSIS — F332 Major depressive disorder, recurrent severe without psychotic features: Secondary | ICD-10-CM | POA: Diagnosis not present

## 2017-03-13 DIAGNOSIS — F332 Major depressive disorder, recurrent severe without psychotic features: Secondary | ICD-10-CM | POA: Diagnosis not present

## 2017-03-27 DIAGNOSIS — F332 Major depressive disorder, recurrent severe without psychotic features: Secondary | ICD-10-CM | POA: Diagnosis not present

## 2017-04-05 DIAGNOSIS — M1812 Unilateral primary osteoarthritis of first carpometacarpal joint, left hand: Secondary | ICD-10-CM | POA: Diagnosis not present

## 2017-04-05 DIAGNOSIS — M19031 Primary osteoarthritis, right wrist: Secondary | ICD-10-CM | POA: Diagnosis not present

## 2017-04-05 DIAGNOSIS — M25332 Other instability, left wrist: Secondary | ICD-10-CM | POA: Diagnosis not present

## 2017-04-10 DIAGNOSIS — F332 Major depressive disorder, recurrent severe without psychotic features: Secondary | ICD-10-CM | POA: Diagnosis not present

## 2017-04-24 DIAGNOSIS — M1812 Unilateral primary osteoarthritis of first carpometacarpal joint, left hand: Secondary | ICD-10-CM | POA: Diagnosis not present

## 2017-04-24 DIAGNOSIS — M25332 Other instability, left wrist: Secondary | ICD-10-CM | POA: Diagnosis not present

## 2017-04-24 DIAGNOSIS — M19042 Primary osteoarthritis, left hand: Secondary | ICD-10-CM | POA: Diagnosis not present

## 2017-04-24 DIAGNOSIS — F332 Major depressive disorder, recurrent severe without psychotic features: Secondary | ICD-10-CM | POA: Diagnosis not present

## 2017-04-26 ENCOUNTER — Other Ambulatory Visit: Payer: Self-pay | Admitting: Orthopedic Surgery

## 2017-04-26 DIAGNOSIS — M25332 Other instability, left wrist: Secondary | ICD-10-CM

## 2017-05-04 DIAGNOSIS — Z86010 Personal history of colonic polyps: Secondary | ICD-10-CM | POA: Diagnosis not present

## 2017-05-04 DIAGNOSIS — K640 First degree hemorrhoids: Secondary | ICD-10-CM | POA: Diagnosis not present

## 2017-05-04 DIAGNOSIS — D126 Benign neoplasm of colon, unspecified: Secondary | ICD-10-CM | POA: Diagnosis not present

## 2017-05-09 DIAGNOSIS — F332 Major depressive disorder, recurrent severe without psychotic features: Secondary | ICD-10-CM | POA: Diagnosis not present

## 2017-05-09 DIAGNOSIS — D126 Benign neoplasm of colon, unspecified: Secondary | ICD-10-CM | POA: Diagnosis not present

## 2017-05-10 DIAGNOSIS — E785 Hyperlipidemia, unspecified: Secondary | ICD-10-CM | POA: Diagnosis not present

## 2017-05-10 DIAGNOSIS — A6000 Herpesviral infection of urogenital system, unspecified: Secondary | ICD-10-CM | POA: Diagnosis not present

## 2017-05-10 DIAGNOSIS — G3184 Mild cognitive impairment, so stated: Secondary | ICD-10-CM | POA: Diagnosis not present

## 2017-05-10 DIAGNOSIS — I10 Essential (primary) hypertension: Secondary | ICD-10-CM | POA: Diagnosis not present

## 2017-05-10 DIAGNOSIS — Z8673 Personal history of transient ischemic attack (TIA), and cerebral infarction without residual deficits: Secondary | ICD-10-CM | POA: Diagnosis not present

## 2017-05-10 DIAGNOSIS — K219 Gastro-esophageal reflux disease without esophagitis: Secondary | ICD-10-CM | POA: Diagnosis not present

## 2017-05-10 DIAGNOSIS — Z125 Encounter for screening for malignant neoplasm of prostate: Secondary | ICD-10-CM | POA: Diagnosis not present

## 2017-05-11 ENCOUNTER — Other Ambulatory Visit: Payer: Self-pay | Admitting: Orthopedic Surgery

## 2017-05-11 ENCOUNTER — Ambulatory Visit
Admission: RE | Admit: 2017-05-11 | Discharge: 2017-05-11 | Disposition: A | Discharge status: Home / Self Care | Payer: Medicare Other | Source: Ambulatory Visit | Attending: Orthopedic Surgery | Admitting: Orthopedic Surgery

## 2017-05-11 DIAGNOSIS — M25332 Other instability, left wrist: Secondary | ICD-10-CM

## 2017-05-11 MED ORDER — IOPAMIDOL (ISOVUE-M 200) INJECTION 41%: 2.0000 mL | Freq: Once | INTRAMUSCULAR | Status: DC

## 2017-05-22 DIAGNOSIS — F332 Major depressive disorder, recurrent severe without psychotic features: Secondary | ICD-10-CM | POA: Diagnosis not present

## 2017-05-25 ENCOUNTER — Ambulatory Visit
Admission: RE | Admit: 2017-05-25 | Discharge: 2017-05-25 | Disposition: A | Discharge status: Home / Self Care | Payer: Medicare Other | Source: Ambulatory Visit | Attending: Orthopedic Surgery | Admitting: Orthopedic Surgery

## 2017-05-25 DIAGNOSIS — M19032 Primary osteoarthritis, left wrist: Secondary | ICD-10-CM | POA: Diagnosis not present

## 2017-05-25 DIAGNOSIS — M25332 Other instability, left wrist: Secondary | ICD-10-CM

## 2017-05-25 DIAGNOSIS — M25532 Pain in left wrist: Secondary | ICD-10-CM | POA: Diagnosis not present

## 2017-05-25 MED ORDER — IOPAMIDOL (ISOVUE-M 200) INJECTION 41%
3.0000 mL | Freq: Once | INTRAMUSCULAR | Status: AC
  Administered 2017-05-25: 3 mL via INTRA_ARTICULAR

## 2017-05-29 DIAGNOSIS — M19042 Primary osteoarthritis, left hand: Secondary | ICD-10-CM | POA: Diagnosis not present

## 2017-05-29 DIAGNOSIS — M25332 Other instability, left wrist: Secondary | ICD-10-CM | POA: Diagnosis not present

## 2017-06-05 DIAGNOSIS — F332 Major depressive disorder, recurrent severe without psychotic features: Secondary | ICD-10-CM | POA: Diagnosis not present

## 2017-06-13 NOTE — Progress Notes (Signed)
[facility]  PATIENT: [name] DOB: [birth year]   REASON FOR VISIT: Follow-up for essential tremor, history of stroke, mild cognitive impairment, depression HISTORY FROM: Patient alone at visit    [place] year African American male with upper extremity fine action tremors likely anxiety related. Benign essential tremor is possible though less likely. History of right brain subcortical infarct due to small vessel disease in December 2011 with no significant residual deficits. History of peripheral neuropathy diagnosis but no real clinical symptoms or signs on exam or on nerve conduction/EMG exam.. Vascular risk factors of hypertension and hyperlipidemia. New 50 llbs weightloss in 1 year without determined cause.  He returns for followup after his last visit on 12/08/11. He states his noticed some improvement in his tremors after starting the primidone which he takes 100 mg daily. He seems to be tolerating it well without any drowsiness, dryness of mouth or other side effects. He states payment on is helping him more than intra-and is wondering if he can come off the Inderal. He has no new complaints. He had lab work done on 12/08/11 which showed normal thyroid function tests and LFTs. He's had no new stroke or TIA symptoms. He remains on Plavix and is tolerating it well without side effects. He states his blood pressure is under good control and it is 131/80 today.   Update 10/16/2013 : He returns for followup after his last visit 6 months ago. He continues to have mild tremors in his hands and is tolerating primadone 125 mg at night fairly well without significant side effects. He states the tremors do not affect his routine or date and activities. She has been complaining of some paresthesias and discomfort involving his head mainly on the left side for 3-4 weeks this is present mainly in the morning but does improve as the day goes along. He denies  headache or discomfort. Denies any sharp shooting pain or any vision loss. He has been complaining of some progressive memory difficulties. He does get lost easily and has to rely on his GPS. He forgets appointments and even really writes things down. He does have long-standing depression and does take medications for that. Update 2/2/16PS :He returns for follow-up after last visit 4 months ago. He reports slight worsening of his left hand tremor which at times now interferes with his activities of daily living during the day as well. He has not tried a higher dose of Mysoline be on 125 mg at bedtime yet but is willing to do so. He states his memory difficulties are unchanged. He remains compliant with his depression medications and sees a psychiatrist regularly. He had lab work done at last visit on 10/16/13 which showed normal vitamin B12, TSH. RPR and HIV antibodies were negative. Carotid ultrasound done on 10/30/13 personally reviewed by me shows no significant extracranial stenosis. Update 10/28/2016PS : He returns for follow-up after last visit 8 months ago. Is accompanied by a friend [name]. He has noticed significant worsening of his memory particularly short-term. This has been gradual over several years but more recently. The patient states that he has been regularly seeing a psychiatrist [name] on a monthly basis and he is on Paxil 40 mg a day. On Mini-Mental status testing today scored 21/30 which is significant decline from 28/30 at last visit. Patient however remains independent in activities of daily living. He lives alone. He states his hand tremors are quite minimal and not bothersome. He remains on  primidone 125 mg twice daily. He denies any recent headaches, falls or head injuries. Prior to last visit he had vitamin B12, TSH and RPR tested which were normal. Update 1/5/2017PS: He returns for follow-up after last visit to 2 months ago. Is accompanied by his cousin [name]. Patient has  noticed improvement in his memory and cognition. He has been compliant with his depression medication which is taking without fail. Today scored 27/30 compared to 21/30 at last visit. He is independent in activities of daily living. He was seen by a psychiatrist's [name] who felt his depression was adequately treated and was concerned about his memory was actually getting worse. Patient himself actually denies this. He had EEG done on 01/08/15 which was normal which I personally reviewed and MRI scan of the brain on 12/04/14 which showed mild changes of chronic microvascular ischemia and atrophy which is unchanged compared with previous MRI. Update 8/4/2017PS : He returns for follow-up after last visit 7 months ago. He states his noticed worsening of his tremors. They involve mostly his hands but also at times his feet. He has trouble doing activities like holding a cup or newspaper. He can get some concentration. His tremors. He has been taking Mysoline 125 mg twice daily for his long-standing essential tremor but feels he needs more medicine now.  Patient states his memory difficulties remain unchanged and are not progressive. He feels his depression is adequately treated but on geriatric depression scale today scored 8 suggestive of mild depression. On Springer he scored23/30 with deficits in recall and orientation.  UPDATE 10/25/2017CM [name] 67 year old male returns for follow-up. He has a history of stroke event in 2011 and was on Plavix until about a year ago. When asked if he can take aspirin he claims he had nausea and vomiting balance issues and dizziness from aspirin. He also has a history of essential tremor and is currently on Mysoline 250 at night he is not taking the morning dose and  his tremor is worse during the day he claims however I see very little tremor on exam. Could be related to anxiety. His memory difficulties are stable and his MOCA was not repeated today. He returns for  reevaluation previous records reviewed UPDATE 11/16/2017CM [name], 67 year old male returns for follow-up. He was just seen the end of October. He has history of stroke event in 2011 and tremor. He has been on Mysoline which was increased at his last visit however he cannot see much improvement. He thinks his tremor is worse when sitting still, no difficulty specifically with eating. Has history of depression and see psychiatry. Mild resting tremor noted today on the right very intermittent no essential tremor. Says his sense of smell, comes and goes. He has had no gait difficulty or falls. He returns for reevaluation Update 03/14/2016 : He returns for follow-up after last visit 3 months ago. He had not noticed significant benefit of increasing the dose of Mysoline to 250 twice daily. He was asked to taper off and discontinue Mysoline instructed take Sinemet by Cecille Rubin nurse practitioner however the patient for unclear reason has chosen not to do it. He remains on Topamax 75 mg daily as well as Mysoline to 50 twice daily. Patient states that he caught driving under the influence of medications and has lost his driving license. He does complain of sleepiness and tiredness during the day. He remains on Plavix for stroke prevention is tolerating it well without side effects. He states his blood  pressure well controlled today was 131/82. Hie has  no further stroke or TIA symptoms. He states his eye memory difficulties unchanged and not getting worse. Update 09/12/2016 PS; he returns for follow-up after last visit 6 months ago. Patient states that increasing the Topamax has not helped and in fact he feels his memory difficulties have gotten worse. He does have an appointment to do neuropsych testing next month. He was supposed to be on Topamax 75 twice daily but in fact has been taking it only once a day. He states his tremors are also worse particularly when he is anxious. He was meant to be on Mysoline 250  mg twice daily but has been taking it only once daily. Patient does see a psychiatrist [name] for his depression which he feels is adequately controlled. He denies any recurrent symptoms of stroke or TIAs. He is tolerating Plavix well without bleeding or bruising. He states his blood pressure is well controlled and today it is 123/80. He is tolerating Mevacor well without muscle aches and pains. He cannot tell me when her last lipid profile was checked. UPDATE 11/28/2018CM [name], 67 year old male returns for follow-up.  He has a history of mild cognitive impairment which he feels is better.  He has been evaluated by neuropsych but has not had follow-up appointments for results.  He sees [name] for depression.  He is currently on Mysoline 250 twice daily for tremor which is well controlled.  He had tapered down on his Topamax at last visit to 50 mg due to cognitive difficulties.  He remains on Plavix for secondary stroke prevention.  He has no bruising and no bleeding.  He has not had further stroke or TIA symptoms.  Blood pressure in the office today 146/92 he claims he is compliant with his blood pressure medications.  He is tolerating Mevacor without muscle aches.  He returns for reevaluation UPDATE 06/14/2017 CM [name], 67 year old male returns for follow-up with a history of mild cognitive impairment.  Memory score today 27 out of 30.  He has been evaluated by neuropsych with the conclusion that he demonstrated variable performances.  Given the possibility of poor effort it was difficult to determine if the performances were accurate.  Given the fact that MMSE was within normal range this suggests that he is not experiencing gross or severe neurocognitive dysfunction but more likely a primary psychiatric disorder may be playing a role in his cognitive complaints.  He does have a history of severe depression with multiple inpatient psych hospitalizations.  Tremor is stable on primidone.  He has  stopped his Topamax.  He remains on Plavix for secondary stroke prevention without further stroke or TIA symptoms.  Blood pressure in the office 128/79.  He is tolerating Mevacor without muscle aches.  He has no exercise and was encouraged to do so.  He returns for reevaluation REVIEW OF SYSTEMS: Full 14 system review of systems performed and notable only for those listed, all others are neg:  Constitutional: neg  Cardiovascular: neg Ear/Nose/Throat: neg  Skin: neg Eyes: neg Respiratory: neg Gastroitestinal: neg  Hematology/Lymphatic: neg  Endocrine: neg Musculoskeletal:neg Allergy/Immunology: neg Neurological: History of stroke, essential tremor,  Psychiatric: Depression followed by psychiatry Sleep : neg   ALLERGIES: Allergies  Allergen Reactions  . Aspirin Other (See Comments)    Dizziness, nausea and vomiting Balance issues  . Lisinopril Other (See Comments)    Critical Cough  . Lithium Other (See Comments)    Critical Per  pt reaction unknown  . Oxycodone Nausea And Vomiting  . Oxycodone-Acetaminophen Nausea And Vomiting    Also causes dizziness    HOME MEDICATIONS: Outpatient Medications Prior to Visit  Medication Sig Dispense Refill  . amLODipine (NORVASC) 10 MG tablet Take 10 mg by mouth daily.    . benztropine (COGENTIN) 0.5 MG tablet Take 0.5 mg 2 (two) times daily by mouth.  0  . clopidogrel (PLAVIX) 75 MG tablet Take 1 tablet (75 mg total) by mouth daily. 30 tablet 11  . diazepam (VALIUM) 5 MG tablet Take 5 mg daily as needed by mouth for anxiety.     Marland Kitchen losartan (COZAAR) 100 MG tablet Take 100 mg daily by mouth.     . lovastatin (MEVACOR) 10 MG tablet Take 10 mg by mouth at bedtime.    . metoprolol succinate (TOPROL-XL) 25 MG 24 hr tablet Take 25 mg by mouth daily.    . modafinil (PROVIGIL) 200 MG tablet Take 200 mg by mouth daily.  0  . PARoxetine (PAXIL) 40 MG tablet Take 40 mg by mouth daily.  1  . prazosin (MINIPRESS) 2 MG capsule Take 2 mg by mouth at  bedtime.    . primidone (MYSOLINE) 250 MG tablet TAKE 1 TABLET (250 MG TOTAL) BY MOUTH 2 (TWO) TIMES DAILY. 60 tablet 5  . REXULTI 3 MG TABS Take 3 mg every morning by mouth.   0  . Armodafinil 200 MG TABS Take 200 mg daily by mouth.     . Fluocinonide 0.1 % CREA APPLY 2-3 GRAMS TO AFFECTED AREAS 3-4 TIMES DAILY (1 GRAM = 1 DIME SIZE)  3  . topiramate (TOPAMAX) 25 MG tablet Take 2 tablets in the morning (Patient not taking: Reported on 06/14/2017) 180 tablet 6  . valACYclovir (VALTREX) 500 MG tablet Take 500 mg by mouth daily.  11  . VIAGRA 100 MG tablet Take 100 mg daily as needed by mouth for erectile dysfunction.      No facility-administered medications prior to visit.     PAST MEDICAL HISTORY: Past Medical History:  Diagnosis Date  . Depression   . Frequent urination   . GERD (gastroesophageal reflux disease)   . Herpes   . Hyperlipidemia   . Hypertension   . Memory loss   . Obesity   . Stroke ([place])   . Urgency of urination     PAST SURGICAL HISTORY: Past Surgical History:  Procedure Laterality Date  . disckec    . SPINAL FUSION    . VASECTOMY      FAMILY HISTORY: Family History  Problem Relation Age of Onset  . Hypertension Mother   . Hypertension Father   . Hypertension Sister   . Hypertension Brother   . Cancer Maternal Aunt   . Cancer - Ovarian Maternal Grandmother   . Diabetes Mellitus I Paternal Grandmother   . Hypertension Paternal Grandfather   . Cancer Maternal Aunt     SOCIAL HISTORY: Social History   Socioeconomic History  . Marital status: Legally Separated    Spouse name: Not on file  . Number of children: 1  . Years of education: Masters  . Highest education level: Not on file  Occupational History  . Occupation: retired    Fish farm manager: RETIRED  Social Needs  . Financial resource strain: Not on file  . Food insecurity:    Worry: Not on file    Inability: Not on file  . Transportation needs:    Medical: Not on file  Non-medical: Not  on file  Tobacco Use  . Smoking status: Former Research scientist (life sciences)  . Smokeless tobacco: Never Used  Substance and Sexual Activity  . Alcohol use: No    Alcohol/week: 0.6 oz    Types: 1 Cans of beer per week    Comment: socially  . Drug use: No  . Sexual activity: Never  Lifestyle  . Physical activity:    Days per week: Not on file    Minutes per session: Not on file  . Stress: Not on file  Relationships  . Social connections:    Talks on phone: Not on file    Gets together: Not on file    Attends religious service: Not on file    Active member of club or organization: Not on file    Attends meetings of clubs or organizations: Not on file    Relationship status: Not on file  . Intimate partner violence:    Fear of current or ex partner: Not on file    Emotionally abused: Not on file    Physically abused: Not on file    Forced sexual activity: Not on file  Other Topics Concern  . Not on file  Social History Narrative   Patient is separated.   Patient lives alone and has one child.   Patient is retired.   Patient is right-handed.   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   Patient does not drink any caffeine.     PHYSICAL EXAM  Vitals:   06/14/17 1000  BP: 128/79  Pulse: 68  Weight: 211 lb (95.7 kg)  Height: 5\' 7"  (1.702 m)   Body mass index is 33.05 kg/m.  Generalized: Well developed, obese male in no acute distress  Head: normocephalic and atraumatic,. Oropharynx benign  Neck: Supple, no carotid bruits  Cardiac: Regular rate rhythm, no murmur  Musculoskeletal: No deformity   Neurological examination   Mentation: Alert AFT 13. Clock drawing 4/4 MMSE - Mini Mental State Exam 06/14/2017 12/14/2016 09/12/2016  Orientation to time 3 4 4   Orientation to Place 5 5 5   Registration 3 3 3   Attention/ Calculation 5 5 2   Recall 2 3 3   Language- name 2 objects 2 2 2   Language- repeat 1 1 1   Language- follow 3 step command 3 2 3   Language- follow 3 step command-comments - used  left hand -  Language- read & follow direction 1 1 1   Write a sentence 1 1 1   Copy design 1 1 1   Total score 27 28 26   Follows all commands speech and language fluent.   Cranial nerve II-XII: Pupils were equal round reactive to light extraocular movements were full, visual field were full on confrontational test. Facial sensation and strength were normal. hearing was intact to finger rubbing bilaterally. Uvula tongue midline. head turning and shoulder shrug were normal and symmetric.Tongue protrusion into cheek strength was normal. Motor: normal bulk and tone, full strength in the BUE, BLE, fine finger movements normal, no pronator drift. No focal weakness, no tremor Sensory: normal and symmetric to light touch, in the upper and lower extremities   Coordination: finger-nose-finger, heel-to-shin bilaterally, no dysmetria Reflexes: 1+ upper lower and symmetric plantar responses were flexor bilaterally. Gait and Station: Rising up from seated position without assistance, normal stance,  moderate stride, good arm swing, smooth turning, able to perform tiptoe, and heel walking without difficulty. Tandem gait is unsteady  DIAGNOSTIC DATA (LABS, IMAGING, TESTING) - I reviewed patient records, labs, notes,  testing and imaging myself where available.  Lab Results  Component Value Date   WBC 6.3 11/27/2016   HGB 15.3 11/27/2016   HCT 45.0 11/27/2016   MCV 94.0 11/27/2016   PLT 299 11/27/2016      Component Value Date/Time   NA 141 11/27/2016 2004   K 3.4 (L) 11/27/2016 2004   CL 106 11/27/2016 2004   CO2 24 11/27/2016 1949   GLUCOSE 101 (H) 11/27/2016 2004   BUN 6 11/27/2016 2004   CREATININE 0.90 11/27/2016 2004   CALCIUM 8.9 11/27/2016 1949   PROT 7.3 05/29/2014 1503   ALBUMIN 4.7 05/29/2014 1503   AST 15 05/29/2014 1503   ALT 10 05/29/2014 1503   ALKPHOS 41 05/29/2014 1503   BILITOT 0.3 05/29/2014 1503   GFRNONAA >60 11/27/2016 1949   GFRAA >60 11/27/2016 1949   Lab Results    Component Value Date   CHOL 150 05/29/2014   HDL 30 (L) 05/29/2014   LDLCALC 63 05/29/2014   TRIG 284 (H) 05/29/2014   CHOLHDL 5.0 05/29/2014   Lab Results  Component Value Date   HGBA1C 5.5 05/29/2014   Lab Results  Component Value Date   VITAMINB12 886 10/16/2013   Lab Results  Component Value Date   TSH 2.850 10/16/2013      ASSESSMENT AND PLAN 35 year African American male with upper extremity resting tremor intermittently,  likely anxiety related.  History of right brain subcortical infarct due to small vessel disease in December 2011 with no significant residual deficits. History of peripheral neuropathy diagnosis but no real clinical symptoms or signs on exam or on nerve conduction/EMG exam. Vascular risk factors of hypertension and hyperlipidemia. Mild memory difficulties likely due to underlying  depression versus early dementia which appears stable.  The patient is a current patient of Dr. [name]  who is out of the office today . This note is sent to the work in doctor.    PLAN:  Tremor is well controlled continue primidone 250 mg twice daily will refill Continue follow-up with psychiatry and compliance with depressive medications Continue Mevacor for hyperlipidemia, labs followed by primary care Continue Plavix for secondary stroke prevention Blood pressure in the office today 128/79 continue antihypertensive medications with goal of blood pressure below 130/90 MMSE is stable Follow-up yearly Dennie Bible, [facility], [facility], [facility] [address] [place], [place] [address] [phone]

## 2017-06-14 ENCOUNTER — Encounter: Payer: Self-pay | Admitting: Nurse Practitioner

## 2017-06-14 ENCOUNTER — Ambulatory Visit (INDEPENDENT_AMBULATORY_CARE_PROVIDER_SITE_OTHER): Payer: Medicare HMO | Admitting: Nurse Practitioner

## 2017-06-14 VITALS — BP 128/79 | HR 68 | Ht 67.0 in | Wt 211.0 lb

## 2017-06-14 DIAGNOSIS — R413 Other amnesia: Secondary | ICD-10-CM | POA: Diagnosis not present

## 2017-06-14 DIAGNOSIS — G250 Essential tremor: Secondary | ICD-10-CM

## 2017-06-14 DIAGNOSIS — E785 Hyperlipidemia, unspecified: Secondary | ICD-10-CM

## 2017-06-14 DIAGNOSIS — F329 Major depressive disorder, single episode, unspecified: Secondary | ICD-10-CM

## 2017-06-14 DIAGNOSIS — F32A Depression, unspecified: Secondary | ICD-10-CM

## 2017-06-14 DIAGNOSIS — Z8673 Personal history of transient ischemic attack (TIA), and cerebral infarction without residual deficits: Secondary | ICD-10-CM

## 2017-06-14 DIAGNOSIS — E7800 Pure hypercholesterolemia, unspecified: Secondary | ICD-10-CM | POA: Insufficient documentation

## 2017-06-14 MED ORDER — PRIMIDONE 250 MG PO TABS: 250.0000 mg | ORAL_TABLET | Freq: Two times a day (BID) | ORAL | 11 refills | Status: DC

## 2017-06-14 NOTE — Patient Instructions (Signed)
Tremor is well controlled continue primidone 250 mg twice daily will refill Continue follow-up with psychiatry and compliance with depressive medications Continue Mevacor for hyperlipidemia, labs followed by primary care Continue Plavix for secondary stroke prevention Blood pressure in the office today 146/92 continue antihypertensive medications with goal of blood pressure below 130/90 MMSE is stable Follow-up yearly

## 2017-06-15 DIAGNOSIS — F332 Major depressive disorder, recurrent severe without psychotic features: Secondary | ICD-10-CM | POA: Diagnosis not present

## 2017-06-15 NOTE — Progress Notes (Signed)
I have reviewed and agreed above plan. 

## 2017-06-26 DIAGNOSIS — F332 Major depressive disorder, recurrent severe without psychotic features: Secondary | ICD-10-CM | POA: Diagnosis not present

## 2017-07-06 DIAGNOSIS — M25561 Pain in right knee: Secondary | ICD-10-CM | POA: Diagnosis not present

## 2017-07-06 DIAGNOSIS — M7651 Patellar tendinitis, right knee: Secondary | ICD-10-CM | POA: Diagnosis not present

## 2017-07-10 DIAGNOSIS — M19032 Primary osteoarthritis, left wrist: Secondary | ICD-10-CM | POA: Diagnosis not present

## 2017-07-10 DIAGNOSIS — M25332 Other instability, left wrist: Secondary | ICD-10-CM | POA: Diagnosis not present

## 2017-07-10 DIAGNOSIS — M19031 Primary osteoarthritis, right wrist: Secondary | ICD-10-CM | POA: Diagnosis not present

## 2017-07-10 DIAGNOSIS — M19042 Primary osteoarthritis, left hand: Secondary | ICD-10-CM | POA: Diagnosis not present

## 2017-07-19 DIAGNOSIS — F902 Attention-deficit hyperactivity disorder, combined type: Secondary | ICD-10-CM | POA: Diagnosis not present

## 2017-07-19 DIAGNOSIS — F332 Major depressive disorder, recurrent severe without psychotic features: Secondary | ICD-10-CM | POA: Diagnosis not present

## 2017-07-24 DIAGNOSIS — F332 Major depressive disorder, recurrent severe without psychotic features: Secondary | ICD-10-CM | POA: Diagnosis not present

## 2017-08-07 DIAGNOSIS — F332 Major depressive disorder, recurrent severe without psychotic features: Secondary | ICD-10-CM | POA: Diagnosis not present

## 2017-08-17 DIAGNOSIS — F332 Major depressive disorder, recurrent severe without psychotic features: Secondary | ICD-10-CM | POA: Diagnosis not present

## 2017-08-21 DIAGNOSIS — M25332 Other instability, left wrist: Secondary | ICD-10-CM | POA: Diagnosis not present

## 2017-08-21 DIAGNOSIS — F332 Major depressive disorder, recurrent severe without psychotic features: Secondary | ICD-10-CM | POA: Diagnosis not present

## 2017-08-21 DIAGNOSIS — M19032 Primary osteoarthritis, left wrist: Secondary | ICD-10-CM | POA: Diagnosis not present

## 2017-08-23 ENCOUNTER — Telehealth: Payer: Self-pay | Admitting: Nurse Practitioner

## 2017-08-23 NOTE — Telephone Encounter (Signed)
Error

## 2017-08-25 DIAGNOSIS — F332 Major depressive disorder, recurrent severe without psychotic features: Secondary | ICD-10-CM | POA: Diagnosis not present

## 2017-09-04 DIAGNOSIS — F332 Major depressive disorder, recurrent severe without psychotic features: Secondary | ICD-10-CM | POA: Diagnosis not present

## 2017-09-07 ENCOUNTER — Encounter (INDEPENDENT_AMBULATORY_CARE_PROVIDER_SITE_OTHER): Payer: Self-pay

## 2017-09-14 ENCOUNTER — Ambulatory Visit: Payer: Medicare HMO | Admitting: Neurology

## 2017-09-25 DIAGNOSIS — R69 Illness, unspecified: Secondary | ICD-10-CM | POA: Diagnosis not present

## 2017-09-25 DIAGNOSIS — F332 Major depressive disorder, recurrent severe without psychotic features: Secondary | ICD-10-CM | POA: Diagnosis not present

## 2017-09-26 ENCOUNTER — Ambulatory Visit (INDEPENDENT_AMBULATORY_CARE_PROVIDER_SITE_OTHER): Payer: Medicare HMO | Admitting: Neurology

## 2017-09-26 ENCOUNTER — Encounter: Payer: Self-pay | Admitting: Neurology

## 2017-09-26 VITALS — BP 135/84 | HR 72 | Ht 67.0 in | Wt 200.4 lb

## 2017-09-26 DIAGNOSIS — G250 Essential tremor: Secondary | ICD-10-CM

## 2017-09-26 DIAGNOSIS — G3184 Mild cognitive impairment, so stated: Secondary | ICD-10-CM

## 2017-09-26 MED ORDER — PRIMIDONE 250 MG PO TABS: 250.0000 mg | ORAL_TABLET | Freq: Every day | ORAL | 11 refills | Status: DC

## 2017-09-26 NOTE — Patient Instructions (Addendum)
I had a long discussion with the patient and his daughter regarding his long-standing short-term memory difficulties and mild cognitive impairment which is likely multifactorial due to combination of his suboptimally treated depression, anxiety, remote stroke, being on multiple psychoactive medications. I have assured them that this is not to progressive degenerative disorder like Alzheimer's. I recommend he do memory compensation strategies. Taper primidone dose to 250 mg daily for 2 months and then discontinue his tremors do not get worse.ICA discrepancy between Topamax in his electronic medication record sources the printed once. He is on a small dose and is taking it asked him to stop that as well. I recommend he keep his appointment with his psychiatrist [name] to discuss optimal treatment of his anxiety/depression and to reduce his medicines if possible. Continue Plavix for stroke prevention and maintain aggressive risk factor modification.He will return for follow-up in 2 months with my nurse practitioner call earlier if necessary  Memory Compensation Strategies  1. Use "WARM" strategy.  W= write it down  A= associate it  R= repeat it  M= make a mental note  2.   You can keep a Social worker.  Use a 3-ring notebook with sections for the following: calendar, important names and phone numbers,  medications, doctors' names/phone numbers, lists/reminders, and a section to journal what you did  each day.   3.    Use a calendar to write appointments down.  4.    Write yourself a schedule for the day.  This can be placed on the calendar or in a separate section of the Memory Notebook.  Keeping a  regular schedule can help memory.  5.    Use medication organizer with sections for each day or morning/evening pills.  You may need help loading it  6.    Keep a basket, or pegboard by the door.  Place items that you need to take out with you in the basket or on the pegboard.  You may also want  to  include a message board for reminders.  7.    Use sticky notes.  Place sticky notes with reminders in a place where the task is performed.  For example: " turn off the  stove" placed by the stove, "lock the door" placed on the door at eye level, " take your medications" on  the bathroom mirror or by the place where you normally take your medications.  8.    Use alarms/timers.  Use while cooking to remind yourself to check on food or as a reminder to take your medicine, or as a  reminder to make a call, or as a reminder to perform another task, etc.

## 2017-09-26 NOTE — Progress Notes (Signed)
Of for of  [facility]  PATIENT: [name] DOB: 03-15-1950   REASON FOR VISIT: Follow-up for  tremor, memory loss, history of stroke HISTORY FROM: Patient  HISTORY OF PRESENT ILLNESS:61 year African American male with upper extremity fine action tremors likely anxiety related. Benign essential tremor is possible though less likely. History of right brain subcortical infarct due to small vessel disease in December 2011 with no significant residual deficits. History of peripheral neuropathy diagnosis but no real clinical symptoms or signs on exam or on nerve conduction/EMG exam.. Vascular risk factors of hypertension and hyperlipidemia. New 50 llbs weightloss in 1 year without determined cause.  He returns for followup after his last visit on 12/08/11. He states his noticed some improvement in his tremors after starting the primidone which he takes 100 mg daily. He seems to be tolerating it well without any drowsiness, dryness of mouth or other side effects. He states payment on is helping him more than intra-and is wondering if he can come off the Inderal. He has no new complaints. He had lab work done on 12/08/11 which showed normal thyroid function tests and LFTs. He's had no new stroke or TIA symptoms. He remains on Plavix and is tolerating it well without side effects. He states his blood pressure is under good control and it is 131/80 today.   Update 10/16/2013 : He returns for followup after his last visit 6 months ago. He continues to have mild tremors in his hands and is tolerating primadone 125 mg at night fairly well without significant side effects. He states the tremors do not affect his routine or date and activities. She has been complaining of some paresthesias and discomfort involving his head mainly on the left side for 3-4 weeks this is present mainly in the morning but does improve as the day goes along. He denies headache or discomfort. Denies any sharp  shooting pain or any vision loss. He has been complaining of some progressive memory difficulties. He does get lost easily and has to rely on his GPS. He forgets appointments and even really writes things down. He does have long-standing depression and does take medications for that. Update 2/2/16PS : He returns for follow-up after last visit 4 months ago. He reports slight worsening of his left hand tremor which at times now interferes with his activities of daily living during the day as well. He has not tried a higher dose of Mysoline be on 125 mg at bedtime yet but is willing to do so. He states his memory difficulties are unchanged. He remains compliant with his depression medications and sees a psychiatrist regularly. He had lab work done at last visit on 10/16/13 which showed normal vitamin B12, TSH. RPR and HIV antibodies were negative. Carotid ultrasound done on 10/30/13 personally reviewed by me shows no significant extracranial stenosis. Update 10/28/2016PS : He returns for follow-up after last visit 8 months ago. Is accompanied by a friend [name]. He has noticed significant worsening of his memory particularly short-term. This has been gradual over several years but more recently. The patient states that he has been regularly seeing a psychiatrist [name] on a monthly basis and he is on Paxil 40 mg a day. On Mini-Mental status testing today scored 21/30 which is significant decline from 28/30 at last visit. Patient however remains independent in activities of daily living. He lives alone. He states his hand tremors are quite minimal and not bothersome. He remains on primidone 125 mg  twice daily. He denies any recent headaches, falls or head injuries. Prior to last visit he had vitamin B12, TSH and RPR tested which were normal. Update 1/5/2017PS : He returns for follow-up after last visit to 2 months ago. Is accompanied by his cousin [name]. Patient has noticed improvement in his memory and  cognition. He has been compliant with his depression medication which is taking without fail. Today scored 27/30 compared to 21/30 at last visit. He is independent in activities of daily living. He was seen by a psychiatrist's [name] who felt his depression was adequately treated and was concerned about his memory was actually getting worse. Patient himself actually denies this. He had EEG done on 01/08/15 which was normal which I personally reviewed and MRI scan of the brain on 12/04/14 which showed mild changes of chronic microvascular ischemia and atrophy which is unchanged compared with previous MRI. Update 8/4/2017PS : He returns for follow-up after last visit 7 months ago. He states his noticed worsening of his tremors. They involve mostly his hands but also at times his feet. He has trouble doing activities like holding a cup or newspaper. He can get some concentration. His tremors. He has been taking Mysoline 125 mg twice daily for his long-standing essential tremor but feels he needs more medicine now.  Patient states his memory difficulties remain unchanged and are not progressive. He feels his depression is adequately treated but on geriatric depression scale today scored 8 suggestive of mild depression. On Home he scored23/30 with deficits in recall and orientation.  UPDATE 10/25/2017CM [name] 67 year old male returns for follow-up. He has a history of stroke event in 2011 and was on Plavix until about a year ago. When asked if he can take aspirin he claims he had nausea and vomiting balance issues and dizziness from aspirin. He also has a history of essential tremor and is currently on Mysoline 250 at night he is not taking the morning dose and  his tremor is worse during the day he claims however I see very little tremor on exam. Could be related to anxiety. His memory difficulties are stable and his MOCA was not repeated today. He returns for reevaluation previous records reviewed UPDATE  11/16/2017CM [name], 67 year old male returns for follow-up. He was just seen the end of October. He has history of stroke event in 2011 and tremor. He has been on Mysoline which was increased at his last visit however he cannot see much improvement. He thinks his tremor is worse when sitting still, no difficulty specifically with eating. Has history of depression and see psychiatry. Mild resting tremor noted today on the right very intermittent no essential tremor. Says his sense of smell, comes and goes. He has had no gait difficulty or falls. He returns for reevaluation Update 03/14/2016 : He returns for follow-up after last visit 3 months ago. He had not noticed significant benefit of increasing the dose of Mysoline to 250 twice daily. He was asked to taper off and discontinue Mysoline instructed take Sinemet by Cecille Rubin nurse practitioner however the patient for unclear reason has chosen not to do it. He remains on Topamax 75 mg daily as well as Mysoline to 50 twice daily. Patient states that he caught driving under the influence of medications and has lost his driving license. He does complain of sleepiness and tiredness during the day. He remains on Plavix for stroke prevention is tolerating it well without side effects. He states his blood pressure well  controlled today was 131/82. Hie has  no further stroke or TIA symptoms. He states his eye memory difficulties unchanged and not getting worse. Update 09/12/2016 ; he returns for follow-up after last visit 6 months ago. Patient states that increasing the Topamax has not helped and in fact he feels his memory difficulties have gotten worse. He does have an appointment to do neuropsych testing next month. He was supposed to be on Topamax 75 twice daily but in fact has been taking it only once a day. He states his tremors are also worse particularly when he is anxious. He was meant to be on Mysoline 250 mg twice daily but has been taking it only once  daily. Patient does see a psychiatrist [name] for his depression which he feels is adequately controlled. He denies any recurrent symptoms of stroke or TIAs. He is tolerating Plavix well without bleeding or bruising. He states his blood pressure is well controlled and today it is 123/80. He is tolerating Mevacor well without muscle aches and pains. He cannot tell me when her last lipid profile was checked. Update 09/26/2017 : he returns for follow-up after last visit 5 months ago. He is accompanied by his daughter who is now his health power of attorney and is noted here from [place] of [place]. She is concerned about his short-term memory being quite poor. He has trouble remembering his medications and recent appointments. He had a tooth implant in June this year and following that case she notices significant worsening of his short-term memory and attention. Patient is trying some compensation strategies which are not helping. Patient is seeing his psychiatrist feels changes medications with some short-term benefit in his attention and memory but it seems to be bothering him again. Patient is quite disoriented at times and the daughter feels that he does much better during the morning but by the end of the day is more confused and disoriented. He has not had any falls or injuries. He denies any significant headaches. There has been no witnessed seizure activity. REVIEW OF SYSTEMS: Full 14 system review of systems performed and notable only for those listed, all others are neg:   T,Appetite change, memory loss, tremors, confusion, decreased concentration depression, , anxiety, nervousness, depression and all other systems negative   ALLERGIES: Allergies  Allergen Reactions  . Aspirin Other (See Comments)    Dizziness, nausea and vomiting Balance issues  . Lisinopril Other (See Comments)    Critical Cough  . Lithium Other (See Comments)    Critical Per pt reaction unknown  . Oxycodone Nausea And Vomiting   . Oxycodone-Acetaminophen Nausea And Vomiting    Also causes dizziness    HOME MEDICATIONS: Outpatient Medications Prior to Visit  Medication Sig Dispense Refill  . amLODipine (NORVASC) 10 MG tablet Take 10 mg by mouth daily.    . benztropine (COGENTIN) 0.5 MG tablet Take 0.5 mg 2 (two) times daily by mouth.  0  . carbidopa-levodopa (SINEMET IR) 25-100 MG tablet TAKE 1 TABLET BY MOUTH 2 (TWO) TIMES DAILY. 30 MIN BEFORE MEALS    . clopidogrel (PLAVIX) 75 MG tablet Take 1 tablet (75 mg total) by mouth daily. 30 tablet 11  . diazepam (VALIUM) 5 MG tablet Take 5 mg daily as needed by mouth for anxiety.     . Fluocinonide 0.1 % CREA APPLY 2-3 GRAMS TO AFFECTED AREAS 3-4 TIMES DAILY (1 GRAM = 1 DIME SIZE)  3  . losartan (COZAAR) 100 MG tablet Take 100 mg  daily by mouth.     . lovastatin (MEVACOR) 10 MG tablet Take 10 mg by mouth at bedtime.    . meloxicam (MOBIC) 15 MG tablet TAKE 1 TABLET BY MOUTH EVERY DAY FOR 14 DAYS  0  . metoprolol succinate (TOPROL-XL) 25 MG 24 hr tablet Take 25 mg by mouth daily.    . modafinil (PROVIGIL) 200 MG tablet Take 200 mg by mouth daily.  0  . PARoxetine (PAXIL) 40 MG tablet Take 40 mg by mouth daily.  1  . prazosin (MINIPRESS) 2 MG capsule Take 2 mg by mouth at bedtime.    Marland Kitchen REXULTI 3 MG TABS Take 3 mg every morning by mouth.   0  . valACYclovir (VALTREX) 500 MG tablet Take 500 mg by mouth daily.  11  . VIAGRA 100 MG tablet Take 100 mg daily as needed by mouth for erectile dysfunction.     . primidone (MYSOLINE) 250 MG tablet Take 1 tablet (250 mg total) by mouth 2 (two) times daily. 60 tablet 11  . topiramate (TOPAMAX) 25 MG tablet Take 3 tablets in the morning and 1 tablet at night for 1 week followed by 3 tablets in the morning and 2 tablets at night for 1 more week and then 3 tablets twice daily as tolerated.    . Armodafinil 200 MG TABS Take 200 mg daily by mouth.     . diazepam (VALIUM) 5 MG tablet     . losartan (COZAAR) 100 MG tablet     . PARoxetine  (PAXIL) 40 MG tablet Take by mouth.    . primidone (MYSOLINE) 250 MG tablet Take by mouth.     No facility-administered medications prior to visit.     PAST MEDICAL HISTORY: Past Medical History:  Diagnosis Date  . Depression   . Frequent urination   . GERD (gastroesophageal reflux disease)   . Herpes   . Hyperlipidemia   . Hypertension   . Memory loss   . Obesity   . Stroke ([place])   . Urgency of urination     PAST SURGICAL HISTORY: Past Surgical History:  Procedure Laterality Date  . disckec    . SPINAL FUSION    . VASECTOMY      FAMILY HISTORY: Family History  Problem Relation Age of Onset  . Hypertension Mother   . Hypertension Father   . Hypertension Sister   . Hypertension Brother   . Cancer Maternal Aunt   . Cancer - Ovarian Maternal Grandmother   . Diabetes Mellitus I Paternal Grandmother   . Hypertension Paternal Grandfather   . Cancer Maternal Aunt     SOCIAL HISTORY: Social History   Socioeconomic History  . Marital status: Legally Separated    Spouse name: Not on file  . Number of children: 1  . Years of education: Masters  . Highest education level: Not on file  Occupational History  . Occupation: retired    Fish farm manager: RETIRED  Social Needs  . Financial resource strain: Not on file  . Food insecurity:    Worry: Not on file    Inability: Not on file  . Transportation needs:    Medical: Not on file    Non-medical: Not on file  Tobacco Use  . Smoking status: Former Research scientist (life sciences)  . Smokeless tobacco: Never Used  Substance and Sexual Activity  . Alcohol use: No    Alcohol/week: 1.0 standard drinks    Types: 1 Cans of beer per week    Comment:  socially  . Drug use: No  . Sexual activity: Never  Lifestyle  . Physical activity:    Days per week: Not on file    Minutes per session: Not on file  . Stress: Not on file  Relationships  . Social connections:    Talks on phone: Not on file    Gets together: Not on file    Attends religious  service: Not on file    Active member of club or organization: Not on file    Attends meetings of clubs or organizations: Not on file    Relationship status: Not on file  . Intimate partner violence:    Fear of current or ex partner: Not on file    Emotionally abused: Not on file    Physically abused: Not on file    Forced sexual activity: Not on file  Other Topics Concern  . Not on file  Social History Narrative   Patient is separated.   Patient lives alone and has one child.   Patient is retired.   Patient is right-handed.   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   Patient does not drink any caffeine.     PHYSICAL EXAM  Vitals:   09/26/17 0821  BP: 135/84  Pulse: 72  Weight: 200 lb 6.4 oz (90.9 kg)  Height: 5\' 7"  (1.702 m)   Body mass index is 31.39 kg/m. Generalized: Well developed middle aged african [place] male, in no acute distress   Head: normocephalic and atraumatic,   Neck: Supple, no carotid bruits  Cardiac: Regular rate rhythm, no murmur  Musculoskeletal: No deformity   Neurological examination  Mentation: Alert oriented to time, place, history taking. Follows all commands speech and language fluent .MMSE  28/30( last visit 26/30)  with deficits in orientation onlyn. Able to name 7 animals with 4 legs. 4/4 on clock drawing. Cranial nerve II-XII: Pupils were equal round reactive to light extraocular movements were full, visual field were full on confrontational test. Facial sensation and strength were normal. hearing was intact to finger rubbing bilaterally. Uvula tongue midline. head turning and shoulder shrug and were normal and symmetric.Tongue protrusion into cheek strength was normal.  Motor: normal bulk and tone, full strength in the BUE, BLE, fine finger movements normal, no pronator drift. No focal weakness.  No tremor noted today Sensory: normal and symmetric to light touch, pinprick, and vibration in the upper and lower extremities Coordination:  finger-nose-finger, heel-to-shin bilaterally, no dysmetria  Gait and Station: Rising up from seated position without assistance, normal stance, without trunk ataxia, moderate stride, good arm swing, smooth turning, able to perform tiptoe, and heel walking without difficulty.  Reflexes: equal and symmetric   DIAGNOSTIC DATA (LABS, IMAGING, TESTING) -ASSESSMENT AND PLAN 89 year African American male with upper extremity resting tremor intermittently,  likely anxiety related.  History of right brain subcortical infarct due to small vessel disease in December 2011 with no significant residual deficits. History of peripheral neuropathy diagnosis but no real clinical symptoms or signs on exam or on nerve conduction/EMG exam. Vascular risk factors of hypertension and hyperlipidemia. Mild memory difficulties likely due to underlying   depression versus mild cognitive impairment from remote stroke and multiple psychoactive medications. which appears stable  PLAN:  I had a long discussion with the patient and his daughter regarding his long-standing short-term memory difficulties and mild cognitive impairment which is likely multifactorial due to combination of his suboptimally treated depression, anxiety, remote stroke, being on multiple psychoactive medications.  I have assured them that this is not to progressive degenerative disorder like Alzheimer's. I recommend he do memory compensation strategies. Taper primidone dose to 250 mg daily for 2 months and then discontinue his tremors do not get worse.ICA discrepancy between Topamax in his electronic medication record sources the printed once. He is on a small dose and is taking it asked him to stop that as well. I recommend he keep his appointment with his psychiatrist [name] to discuss optimal treatment of his anxiety/depression and to reduce his medicines if possible. Continue Plavix for stroke prevention and maintain aggressive risk factor modification.He will  return for follow-up in 2 months with my nurse practitioner call earlier if necessary. Greater than 50% time during this 25 minute visit was spent on counseling and coordination of care about his cognitive difficulties, tremors, stroke and answering questions.     Antony Contras, MD [facility] [address] [place], [place] [address] [phone]

## 2017-09-27 ENCOUNTER — Telehealth: Payer: Self-pay

## 2017-09-27 NOTE — Telephone Encounter (Signed)
RN fax last office notes to [facility] psychiatric clinical nurse specialist. Release form from their office attach,and scan in.

## 2017-10-02 DIAGNOSIS — M25332 Other instability, left wrist: Secondary | ICD-10-CM | POA: Diagnosis not present

## 2017-10-02 DIAGNOSIS — M19031 Primary osteoarthritis, right wrist: Secondary | ICD-10-CM | POA: Diagnosis not present

## 2017-10-02 DIAGNOSIS — M19032 Primary osteoarthritis, left wrist: Secondary | ICD-10-CM | POA: Diagnosis not present

## 2017-10-06 DIAGNOSIS — N401 Enlarged prostate with lower urinary tract symptoms: Secondary | ICD-10-CM | POA: Diagnosis not present

## 2017-10-09 DIAGNOSIS — F332 Major depressive disorder, recurrent severe without psychotic features: Secondary | ICD-10-CM | POA: Diagnosis not present

## 2017-10-09 DIAGNOSIS — R69 Illness, unspecified: Secondary | ICD-10-CM | POA: Diagnosis not present

## 2017-10-13 DIAGNOSIS — R351 Nocturia: Secondary | ICD-10-CM | POA: Diagnosis not present

## 2017-10-13 DIAGNOSIS — N5201 Erectile dysfunction due to arterial insufficiency: Secondary | ICD-10-CM | POA: Diagnosis not present

## 2017-10-13 DIAGNOSIS — N401 Enlarged prostate with lower urinary tract symptoms: Secondary | ICD-10-CM | POA: Diagnosis not present

## 2017-10-18 DIAGNOSIS — M25561 Pain in right knee: Secondary | ICD-10-CM | POA: Diagnosis not present

## 2017-10-26 DIAGNOSIS — R69 Illness, unspecified: Secondary | ICD-10-CM | POA: Diagnosis not present

## 2017-10-31 DIAGNOSIS — R69 Illness, unspecified: Secondary | ICD-10-CM | POA: Diagnosis not present

## 2017-11-02 DIAGNOSIS — M25561 Pain in right knee: Secondary | ICD-10-CM | POA: Diagnosis not present

## 2017-11-06 DIAGNOSIS — R69 Illness, unspecified: Secondary | ICD-10-CM | POA: Diagnosis not present

## 2017-11-10 DIAGNOSIS — M25561 Pain in right knee: Secondary | ICD-10-CM | POA: Diagnosis not present

## 2017-11-20 DIAGNOSIS — F332 Major depressive disorder, recurrent severe without psychotic features: Secondary | ICD-10-CM | POA: Diagnosis not present

## 2017-11-20 DIAGNOSIS — R69 Illness, unspecified: Secondary | ICD-10-CM | POA: Diagnosis not present

## 2017-11-20 DIAGNOSIS — F5101 Primary insomnia: Secondary | ICD-10-CM | POA: Diagnosis not present

## 2017-11-20 DIAGNOSIS — G2401 Drug induced subacute dyskinesia: Secondary | ICD-10-CM | POA: Diagnosis not present

## 2017-11-20 DIAGNOSIS — G218 Other secondary parkinsonism: Secondary | ICD-10-CM | POA: Diagnosis not present

## 2017-11-22 DIAGNOSIS — M19032 Primary osteoarthritis, left wrist: Secondary | ICD-10-CM | POA: Diagnosis not present

## 2017-11-22 DIAGNOSIS — M25332 Other instability, left wrist: Secondary | ICD-10-CM | POA: Diagnosis not present

## 2017-11-27 DIAGNOSIS — I10 Essential (primary) hypertension: Secondary | ICD-10-CM | POA: Diagnosis not present

## 2017-11-27 DIAGNOSIS — R69 Illness, unspecified: Secondary | ICD-10-CM | POA: Diagnosis not present

## 2017-11-27 DIAGNOSIS — Z8673 Personal history of transient ischemic attack (TIA), and cerebral infarction without residual deficits: Secondary | ICD-10-CM | POA: Diagnosis not present

## 2017-11-27 DIAGNOSIS — K219 Gastro-esophageal reflux disease without esophagitis: Secondary | ICD-10-CM | POA: Diagnosis not present

## 2017-11-27 DIAGNOSIS — G3184 Mild cognitive impairment, so stated: Secondary | ICD-10-CM | POA: Diagnosis not present

## 2017-11-27 DIAGNOSIS — Z0000 Encounter for general adult medical examination without abnormal findings: Secondary | ICD-10-CM | POA: Diagnosis not present

## 2017-11-27 DIAGNOSIS — A6000 Herpesviral infection of urogenital system, unspecified: Secondary | ICD-10-CM | POA: Diagnosis not present

## 2017-11-27 DIAGNOSIS — E785 Hyperlipidemia, unspecified: Secondary | ICD-10-CM | POA: Diagnosis not present

## 2017-12-04 ENCOUNTER — Ambulatory Visit (INDEPENDENT_AMBULATORY_CARE_PROVIDER_SITE_OTHER): Payer: Medicare HMO | Admitting: Neurology

## 2017-12-04 ENCOUNTER — Encounter: Payer: Self-pay | Admitting: Neurology

## 2017-12-04 VITALS — BP 148/96 | HR 87 | Ht 67.0 in | Wt 200.0 lb

## 2017-12-04 DIAGNOSIS — R251 Tremor, unspecified: Secondary | ICD-10-CM

## 2017-12-04 NOTE — Progress Notes (Signed)
Of for of  [facility]  PATIENT: [name] DOB: 03-15-1950   REASON FOR VISIT: Follow-up for  tremor, memory loss, history of stroke HISTORY FROM: Patient  HISTORY OF PRESENT ILLNESS:61 year African American male with upper extremity fine action tremors likely anxiety related. Benign essential tremor is possible though less likely. History of right brain subcortical infarct due to small vessel disease in December 2011 with no significant residual deficits. History of peripheral neuropathy diagnosis but no real clinical symptoms or signs on exam or on nerve conduction/EMG exam.. Vascular risk factors of hypertension and hyperlipidemia. New 50 llbs weightloss in 1 year without determined cause.  He returns for followup after Christopher last visit on 12/08/11. He states Christopher noticed some improvement in Christopher tremors after starting the primidone which he takes 100 mg daily. He seems to be tolerating it well without any drowsiness, dryness of mouth or other side effects. He states payment on is helping him more than intra-and is wondering if he can come off the Inderal. He has no new complaints. He had lab work done on 12/08/11 which showed normal thyroid function tests and LFTs. He's had no new stroke or TIA symptoms. He remains on Plavix and is tolerating it well without side effects. He states Christopher blood pressure is under good control and it is 131/80 today.   Update 10/16/2013 : He returns for followup after Christopher last visit 6 months ago. He continues to have mild tremors in Christopher hands and is tolerating primadone 125 mg at night fairly well without significant side effects. He states the tremors do not affect Christopher routine or date and activities. She has been complaining of some paresthesias and discomfort involving Christopher head mainly on the left side for 3-4 weeks this is present mainly in the morning but does improve as the day goes along. He denies headache or discomfort. Denies any sharp  shooting pain or any vision loss. He has been complaining of some progressive memory difficulties. He does get lost easily and has to rely on Christopher GPS. He forgets appointments and even really writes things down. He does have long-standing depression and does take medications for that. Update 2/2/16PS : He returns for follow-up after last visit 4 months ago. He reports slight worsening of Christopher left hand tremor which at times now interferes with Christopher activities of daily living during the day as well. He has not tried a higher dose of Mysoline be on 125 mg at bedtime yet but is willing to do so. He states Christopher memory difficulties are unchanged. He remains compliant with Christopher depression medications and sees a psychiatrist regularly. He had lab work done at last visit on 10/16/13 which showed normal vitamin B12, TSH. RPR and HIV antibodies were negative. Carotid ultrasound done on 10/30/13 personally reviewed by me shows no significant extracranial stenosis. Update 10/28/2016PS : He returns for follow-up after last visit 8 months ago. Is accompanied by a friend [name]. He has noticed significant worsening of Christopher memory particularly short-term. This has been gradual over several years but more recently. The patient states that he has been regularly seeing a psychiatrist Dr. Reece Leon on a monthly basis and he is on Paxil 40 mg a day. On Mini-Mental status testing today scored 21/30 which is significant decline from 28/30 at last visit. Patient however remains independent in activities of daily living. He lives alone. He states Christopher hand tremors are quite minimal and not bothersome. He remains on primidone 125 mg  twice daily. He denies any recent headaches, falls or head injuries. Prior to last visit he had vitamin B12, TSH and RPR tested which were normal. Update 1/5/2017PS : He returns for follow-up after last visit to 2 months ago. Is accompanied by Christopher cousin [name]. Patient has noticed improvement in Christopher memory and  cognition. He has been compliant with Christopher depression medication which is taking without fail. Today scored 27/30 compared to 21/30 at last visit. He is independent in activities of daily living. He was seen by a psychiatrist's Dr. Reece Leon who felt Christopher depression was adequately treated and was concerned about Christopher memory was actually getting worse. Patient himself actually denies this. He had EEG done on 01/08/15 which was normal which I personally reviewed and MRI scan of the brain on 12/04/14 which showed mild changes of chronic microvascular ischemia and atrophy which is unchanged compared with previous MRI. Update 8/4/2017PS : He returns for follow-up after last visit 7 months ago. He states Christopher noticed worsening of Christopher tremors. They involve mostly Christopher hands but also at times Christopher feet. He has trouble doing activities like holding a cup or newspaper. He can get some concentration. Christopher tremors. He has been taking Mysoline 125 mg twice daily for Christopher long-standing essential tremor but feels he needs more medicine now.  Patient states Christopher memory difficulties remain unchanged and are not progressive. He feels Christopher depression is adequately treated but on geriatric depression scale today scored 8 suggestive of mild depression. On [place] he scored23/30 with deficits in recall and orientation.  UPDATE 10/25/2017CM [name] 67 year old male returns for follow-up. He has a history of stroke event in 2011 and was on Plavix until about a year ago. When asked if he can take aspirin he claims he had nausea and vomiting balance issues and dizziness from aspirin. He also has a history of essential tremor and is currently on Mysoline 250 at night he is not taking the morning dose and  Christopher tremor is worse during the day he claims however I see very little tremor on exam. Could be related to anxiety. Christopher memory difficulties are stable and Christopher MOCA was not repeated today. He returns for reevaluation previous records reviewed UPDATE  11/16/2017CM [name], 67 year old male returns for follow-up. He was just seen the end of October. He has history of stroke event in 2011 and tremor. He has been on Mysoline which was increased at Christopher last visit however he cannot see much improvement. He thinks Christopher tremor is worse when sitting still, no difficulty specifically with eating. Has history of depression and see psychiatry. Mild resting tremor noted today on the right very intermittent no essential tremor. Says Christopher sense of smell, comes and goes. He has had no gait difficulty or falls. He returns for reevaluation Update 03/14/2016 : He returns for follow-up after last visit 3 months ago. He had not noticed significant benefit of increasing the dose of Mysoline to 250 twice daily. He was asked to taper off and discontinue Mysoline instructed take Sinemet by [name] however the patient for unclear reason has chosen not to do it. He remains on Topamax 75 mg daily as well as Mysoline to 50 twice daily. Patient states that he caught driving under the influence of medications and has lost Christopher driving license. He does complain of sleepiness and tiredness during the day. He remains on Plavix for stroke prevention is tolerating it well without side effects. He states Christopher blood pressure well  controlled today was 131/82. Hie has  no further stroke or TIA symptoms. He states Christopher eye memory difficulties unchanged and not getting worse. Update 09/12/2016 ; he returns for follow-up after last visit 6 months ago. Patient states that increasing the Topamax has not helped and in fact he feels Christopher memory difficulties have gotten worse. He does have an appointment to do neuropsych testing next month. He was supposed to be on Topamax 75 twice daily but in fact has been taking it only once a day. He states Christopher tremors are also worse particularly when he is anxious. He was meant to be on Mysoline 250 mg twice daily but has been taking it only once  daily. Patient does see a psychiatrist Dr. Reece Leon for Christopher depression which he feels is adequately controlled. He denies any recurrent symptoms of stroke or TIAs. He is tolerating Plavix well without bleeding or bruising. He states Christopher blood pressure is well controlled and today it is 123/80. He is tolerating Mevacor well without muscle aches and pains. He cannot tell me when her last lipid profile was checked. Update 09/26/2017 : he returns for follow-up after last visit 5 months ago. He is accompanied by [name] who is now Christopher health power of attorney and is noted here from out of state. She is concerned about Christopher short-term memory being quite poor. He has trouble remembering Christopher medications and recent appointments. He had a tooth implant in June this year and following that case she notices significant worsening of Christopher short-term memory and attention. Patient is trying some compensation strategies which are not helping. Patient is seeing Christopher psychiatrist feels changes medications with some short-term benefit in Christopher attention and memory but it seems to be bothering him again. Patient is quite disoriented at times and the Leon feels that he does much better during the morning but by the end of the day is more confused and disoriented. He has not had any falls or injuries. He denies any significant headaches. There has been no witnessed seizure activity. Update 12/05/2017 :He returns for follow-up after last visit 2 months ago.  He is accompanied by [name].  They report slight worsening of Christopher hand tremors after Christopher reduce the dose of primidone.  However he has also been started on new medication increase for tardive dyskinesia.  He has changes psychiatrist from Dr. Reece Leon to [name].  He also plans to reduce Christopher psychoactive medications at next visit.  Patient had noticed some lip and hand and vomiting movements which may be associated less but he has been on this new medication for 2 weeks only.   The patient continues to have mild cognitive impairment which appears to be unchanged.  He has not had stroke or TIA symptoms for many years.  He has no other new complaints. REVIEW OF SYSTEMS: Full 14 system review of systems performed and notable only for those listed, all others are neg:   , memory loss, tremors,   decreased concentration depression, , anxiety, nervousness, depression and all other systems negative   ALLERGIES: Allergies  Allergen Reactions  . Acyclovir And Related   . Aspirin Other (See Comments)    Dizziness, nausea and vomiting Balance issues  . Lisinopril Other (See Comments)    Critical Cough  . Lithium Other (See Comments)    Critical Per pt reaction unknown  . Oxycodone Nausea And Vomiting  . Oxycodone-Acetaminophen Nausea And Vomiting    Also causes dizziness    HOME  MEDICATIONS: Outpatient Medications Prior to Visit  Medication Sig Dispense Refill  . amLODipine (NORVASC) 10 MG tablet Take 10 mg by mouth daily.    . benztropine (COGENTIN) 0.5 MG tablet Take 0.5 mg 2 (two) times daily by mouth.  0  . clopidogrel (PLAVIX) 75 MG tablet Take 1 tablet (75 mg total) by mouth daily. 30 tablet 11  . diazepam (VALIUM) 5 MG tablet Take 5 mg daily as needed by mouth for anxiety.     . Fluocinonide 0.1 % CREA APPLY 2-3 GRAMS TO AFFECTED AREAS 3-4 TIMES DAILY (1 GRAM = 1 DIME SIZE)  3  . losartan (COZAAR) 100 MG tablet Take 100 mg daily by mouth.     . lovastatin (MEVACOR) 10 MG tablet Take 10 mg by mouth at bedtime.    . modafinil (PROVIGIL) 200 MG tablet Take 200 mg by mouth daily.  0  . PARoxetine (PAXIL) 40 MG tablet Take 40 mg by mouth daily.  1  . prazosin (MINIPRESS) 2 MG capsule Take 2 mg by mouth at bedtime.    . primidone (MYSOLINE) 250 MG tablet Take 1 tablet (250 mg total) by mouth at bedtime. 60 tablet 11  . REXULTI 3 MG TABS Take 3 mg every morning by mouth.   0  . Valbenazine Tosylate (INGREZZA) 40 MG CAPS Take by mouth.    Marland Kitchen VIAGRA 100 MG tablet  Take 100 mg daily as needed by mouth for erectile dysfunction.     . carbidopa-levodopa (SINEMET IR) 25-100 MG tablet TAKE 1 TABLET BY MOUTH 2 (TWO) TIMES DAILY. 30 MIN BEFORE MEALS    . meloxicam (MOBIC) 15 MG tablet TAKE 1 TABLET BY MOUTH EVERY DAY FOR 14 DAYS  0  . metoprolol succinate (TOPROL-XL) 25 MG 24 hr tablet Take 25 mg by mouth daily.    . valACYclovir (VALTREX) 500 MG tablet Take 500 mg by mouth daily.  11   No facility-administered medications prior to visit.     PAST MEDICAL HISTORY: Past Medical History:  Diagnosis Date  . Depression   . Frequent urination   . GERD (gastroesophageal reflux disease)   . Herpes   . Hyperlipidemia   . Hypertension   . Memory loss   . Obesity   . Stroke ([place])   . Urgency of urination     PAST SURGICAL HISTORY: Past Surgical History:  Procedure Laterality Date  . disckec    . SPINAL FUSION    . VASECTOMY      FAMILY HISTORY: Family History  Problem Relation Age of Onset  . Hypertension Mother   . Hypertension Father   . Hypertension Sister   . Hypertension Brother   . Cancer Maternal Aunt   . Cancer - Ovarian Maternal Grandmother   . Diabetes Mellitus I Paternal Grandmother   . Hypertension Paternal Grandfather   . Cancer Maternal Aunt     SOCIAL HISTORY: Social History   Socioeconomic History  . Marital status: Legally Separated    Spouse name: Not on file  . Number of children: 1  . Years of education: Masters  . Highest education level: Not on file  Occupational History  . Occupation: retired    Fish farm manager: RETIRED  Social Needs  . Financial resource strain: Not on file  . Food insecurity:    Worry: Not on file    Inability: Not on file  . Transportation needs:    Medical: Not on file    Non-medical: Not on file  Tobacco  Use  . Smoking status: Former Smoker  . Smokeless tobacco: Never Used  Substance and Sexual Activity  . Alcohol use: No    Alcohol/week: 1.0 standard drinks    Types: 1 Cans of beer  per week    Comment: socially  . Drug use: No  . Sexual activity: Never  Lifestyle  . Physical activity:    Days per week: Not on file    Minutes per session: Not on file  . Stress: Not on file  Relationships  . Social connections:    Talks on phone: Not on file    Gets together: Not on file    Attends religious service: Not on file    Active member of club or organization: Not on file    Attends meetings of clubs or organizations: Not on file    Relationship status: Not on file  . Intimate partner violence:    Fear of current or ex partner: Not on file    Emotionally abused: Not on file    Physically abused: Not on file    Forced sexual activity: Not on file  Other Topics Concern  . Not on file  Social History Narrative   Patient is separated.   Patient lives alone and has one child.   Patient is retired.   Patient is right-handed.   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   Patient does not drink any caffeine.     PHYSICAL EXAM  Vitals:   12/04/17 1008  BP: (!) 148/96  Pulse: 87  Weight: 200 lb (90.7 kg)  Height: 5\' 7"  (1.702 m)   Body mass index is 31.32 kg/m. Generalized: Well developed middle aged african [place] male, in no acute distress   Head: normocephalic and atraumatic,   Neck: Supple, no carotid bruits  Cardiac: Regular rate rhythm, no murmur  Musculoskeletal: No deformity   Neurological examination  Mentation: Alert oriented to time, place, history taking. Follows all commands speech and language fluent .MMSE  28/30( last visit 26/30)  with deficits in orientation onlyn. Able to name 7 animals with 4 legs. 4/4 on clock drawing. Cranial nerve II-XII: Pupils were equal round reactive to light extraocular movements were full, visual field were full on confrontational test. Facial sensation and strength were normal. hearing was intact to finger rubbing bilaterally. Uvula tongue midline. head turning and shoulder shrug and were normal and symmetric.Tongue  protrusion into cheek strength was normal.  Motor: normal bulk and tone, full strength in the BUE, BLE, fine finger movements normal, no pronator drift. No focal weakness. Mild action upper extremities tremor noted today Sensory: normal and symmetric to light touch, pinprick, and vibration in the upper and lower extremities Coordination: finger-nose-finger, heel-to-shin bilaterally, no dysmetria  Gait and Station: Rising up from seated position without assistance, normal stance, without trunk ataxia, moderate stride, good arm swing, smooth turning,.Unable able to perform tiptoe, and heel walking without difficulty.  Reflexes: equal and symmetric   DIAGNOSTIC DATA (LABS, IMAGING, TESTING) -ASSESSMENT AND PLAN 89 year African American male with upper extremity resting tremor intermittently,  likely anxiety related.  History of right brain subcortical infarct due to small vessel disease in December 2011 with no significant residual deficits. History of peripheral neuropathy diagnosis but no real clinical symptoms or signs on exam or on nerve conduction/EMG exam. Vascular risk factors of hypertension and hyperlipidemia. Mild memory difficulties likely due to underlying   depression versus mild cognitive impairment from remote stroke and multiple psychoactive medications. which appears  stable  PLAN:  I had a long discussion with the patient and [name] regarding Christopher tremors, mild cognitive impairment remote stroke, tardive dyskinesia and suboptimally treated depression and answered questions.  I recommend we continue primidone 250 mg daily in the current dose and not reduce it further as he is noticed some worsening in Christopher tremors but I am not sure whether this is related to starting new medication Ingreeza for tardive dyskinesia or not.  Continue follow-up with Christopher new psychiatrist [name] for optimization of Christopher anxiety/depression medications.  Continue Plavix for stroke prevention and maintain  aggressive risk factor modification.  Return for follow-up in 3 months or call earlier if necessary.Greater than 50% time during this 25 minute visit was spent on counseling and coordination of care about Christopher cognitive difficulties, tremors, stroke and answering questions.     Antony Contras, MD [facility] [address] [place], [place] [address] [phone]

## 2017-12-04 NOTE — Patient Instructions (Signed)
I had a long discussion with the patient and his daughter regarding his tremors, mild cognitive impairment remote stroke, tardive dyskinesia and suboptimally treated depression and answered questions.  I recommend we continue primidone 250 mg daily in the current dose and not reduce it further as he is noticed some worsening in his tremors but I am not sure whether this is related to starting new medication Ingreeza for tardive dyskinesia or not.  Continue follow-up with his new psychiatrist Dr. Daron Offer for optimization of his anxiety/depression medications.  Return for follow-up in 3 months or call earlier if necessary.

## 2017-12-07 DIAGNOSIS — F332 Major depressive disorder, recurrent severe without psychotic features: Secondary | ICD-10-CM | POA: Diagnosis not present

## 2017-12-07 DIAGNOSIS — R69 Illness, unspecified: Secondary | ICD-10-CM | POA: Diagnosis not present

## 2017-12-07 DIAGNOSIS — F5101 Primary insomnia: Secondary | ICD-10-CM | POA: Diagnosis not present

## 2017-12-07 DIAGNOSIS — G218 Other secondary parkinsonism: Secondary | ICD-10-CM | POA: Diagnosis not present

## 2017-12-07 DIAGNOSIS — G2401 Drug induced subacute dyskinesia: Secondary | ICD-10-CM | POA: Diagnosis not present

## 2017-12-11 DIAGNOSIS — S6010XA Contusion of unspecified finger with damage to nail, initial encounter: Secondary | ICD-10-CM | POA: Diagnosis not present

## 2017-12-11 DIAGNOSIS — M25561 Pain in right knee: Secondary | ICD-10-CM | POA: Diagnosis not present

## 2017-12-18 DIAGNOSIS — R69 Illness, unspecified: Secondary | ICD-10-CM | POA: Diagnosis not present

## 2017-12-22 DIAGNOSIS — M25561 Pain in right knee: Secondary | ICD-10-CM | POA: Diagnosis not present

## 2018-01-01 DIAGNOSIS — R69 Illness, unspecified: Secondary | ICD-10-CM | POA: Diagnosis not present

## 2018-01-02 DIAGNOSIS — S83241D Other tear of medial meniscus, current injury, right knee, subsequent encounter: Secondary | ICD-10-CM | POA: Diagnosis not present

## 2018-01-02 DIAGNOSIS — M1711 Unilateral primary osteoarthritis, right knee: Secondary | ICD-10-CM | POA: Diagnosis not present

## 2018-01-31 DIAGNOSIS — M1711 Unilateral primary osteoarthritis, right knee: Secondary | ICD-10-CM | POA: Diagnosis not present

## 2018-02-07 DIAGNOSIS — M1711 Unilateral primary osteoarthritis, right knee: Secondary | ICD-10-CM | POA: Diagnosis not present

## 2018-02-08 DIAGNOSIS — F332 Major depressive disorder, recurrent severe without psychotic features: Secondary | ICD-10-CM | POA: Diagnosis not present

## 2018-02-14 DIAGNOSIS — F5101 Primary insomnia: Secondary | ICD-10-CM | POA: Diagnosis not present

## 2018-02-14 DIAGNOSIS — M1711 Unilateral primary osteoarthritis, right knee: Secondary | ICD-10-CM | POA: Diagnosis not present

## 2018-02-14 DIAGNOSIS — G2401 Drug induced subacute dyskinesia: Secondary | ICD-10-CM | POA: Diagnosis not present

## 2018-02-14 DIAGNOSIS — G218 Other secondary parkinsonism: Secondary | ICD-10-CM | POA: Diagnosis not present

## 2018-02-14 DIAGNOSIS — F0390 Unspecified dementia without behavioral disturbance: Secondary | ICD-10-CM | POA: Diagnosis not present

## 2018-02-14 DIAGNOSIS — F332 Major depressive disorder, recurrent severe without psychotic features: Secondary | ICD-10-CM | POA: Diagnosis not present

## 2018-02-21 DIAGNOSIS — F332 Major depressive disorder, recurrent severe without psychotic features: Secondary | ICD-10-CM | POA: Diagnosis not present

## 2018-02-24 DIAGNOSIS — F331 Major depressive disorder, recurrent, moderate: Secondary | ICD-10-CM | POA: Diagnosis not present

## 2018-03-05 DIAGNOSIS — M1711 Unilateral primary osteoarthritis, right knee: Secondary | ICD-10-CM | POA: Diagnosis not present

## 2018-03-07 DIAGNOSIS — M4180 Other forms of scoliosis, site unspecified: Secondary | ICD-10-CM | POA: Diagnosis not present

## 2018-03-07 DIAGNOSIS — Z981 Arthrodesis status: Secondary | ICD-10-CM | POA: Diagnosis not present

## 2018-03-07 DIAGNOSIS — M545 Low back pain: Secondary | ICD-10-CM | POA: Diagnosis not present

## 2018-03-07 DIAGNOSIS — F332 Major depressive disorder, recurrent severe without psychotic features: Secondary | ICD-10-CM | POA: Diagnosis not present

## 2018-03-12 ENCOUNTER — Ambulatory Visit (INDEPENDENT_AMBULATORY_CARE_PROVIDER_SITE_OTHER): Payer: Medicare Other | Admitting: Neurology

## 2018-03-12 ENCOUNTER — Encounter: Payer: Self-pay | Admitting: Neurology

## 2018-03-12 VITALS — BP 146/91 | HR 91 | Ht 67.0 in | Wt 185.6 lb

## 2018-03-12 DIAGNOSIS — F331 Major depressive disorder, recurrent, moderate: Secondary | ICD-10-CM | POA: Diagnosis not present

## 2018-03-12 DIAGNOSIS — G3184 Mild cognitive impairment, so stated: Secondary | ICD-10-CM

## 2018-03-12 DIAGNOSIS — G250 Essential tremor: Secondary | ICD-10-CM | POA: Diagnosis not present

## 2018-03-12 NOTE — Patient Instructions (Signed)
I had a long discussion with the patient and his daughter regarding his tremors, mild cognitive impairment, remote stroke, tardive dyskinesia and suboptimally treated depression and answered questions.  I recommend we continue primidone 250 mg daily in the current dose  .  Continue follow-up with his new psychiatrist Dr. Daron Offer for optimization of his anxiety/depression medications.  Namenda start taking fish oil 1200 mg  daily as well as increase participation in cognitively challenging activities like solving crossword puzzles, playing bridge and sudoku.  He was also advised to do regular physical exercises daily.   We also discussed memory compensation strategies. Continue Plavix for stroke prevention and maintain aggressive risk factor modification.  Return for follow-up in 3 months with my nurse practitioner Janett Billow or call earlier if necessary. Memory Compensation Strategies  1. Use "WARM" strategy.  W= write it down  A= associate it  R= repeat it  M= make a mental note  2.   You can keep a Social worker.  Use a 3-ring notebook with sections for the following: calendar, important names and phone numbers,  medications, doctors' names/phone numbers, lists/reminders, and a section to journal what you did  each day.   3.    Use a calendar to write appointments down.  4.    Write yourself a schedule for the day.  This can be placed on the calendar or in a separate section of the Memory Notebook.  Keeping a  regular schedule can help memory.  5.    Use medication organizer with sections for each day or morning/evening pills.  You may need help loading it  6.    Keep a basket, or pegboard by the door.  Place items that you need to take out with you in the basket or on the pegboard.  You may also want to  include a message board for reminders.  7.    Use sticky notes.  Place sticky notes with reminders in a place where the task is performed.  For example: " turn off the  stove" placed by the  stove, "lock the door" placed on the door at eye level, " take your medications" on  the bathroom mirror or by the place where you normally take your medications.  8.    Use alarms/timers.  Use while cooking to remind yourself to check on food or as a reminder to take your medicine, or as a  reminder to make a call, or as a reminder to perform another task, etc.

## 2018-03-12 NOTE — Progress Notes (Signed)
Of for of  [facility]  PATIENT: [name] DOB: 1950/05/06   REASON FOR VISIT: Follow-up for  tremor, memory loss, history of stroke HISTORY FROM: Patient  HISTORY OF PRESENT ILLNESS:61 year African American male with upper extremity fine action tremors likely anxiety related. Benign essential tremor is possible though less likely. History of right brain subcortical infarct due to small vessel disease in December 2011 with no significant residual deficits. History of peripheral neuropathy diagnosis but no real clinical symptoms or signs on exam or on nerve conduction/EMG exam.. Vascular risk factors of hypertension and hyperlipidemia. New 50 llbs weightloss in 1 year without determined cause.  He returns for followup after his last visit on 12/08/11. He states his noticed some improvement in his tremors after starting the primidone which he takes 100 mg daily. He seems to be tolerating it well without any drowsiness, dryness of mouth or other side effects. He states payment on is helping him more than intra-and is wondering if he can come off the Inderal. He has no new complaints. He had lab work done on 12/08/11 which showed normal thyroid function tests and LFTs. He's had no new stroke or TIA symptoms. He remains on Plavix and is tolerating it well without side effects. He states his blood pressure is under good control and it is 131/80 today.   Update 10/16/2013 : He returns for followup after his last visit 6 months ago. He continues to have mild tremors in his hands and is tolerating primadone 125 mg at night fairly well without significant side effects. He states the tremors do not affect his routine or date and activities. She has been complaining of some paresthesias and discomfort involving his head mainly on the left side for 3-4 weeks this is present mainly in the morning but does improve as the day goes along. He denies headache or discomfort. Denies any sharp  shooting pain or any vision loss. He has been complaining of some progressive memory difficulties. He does get lost easily and has to rely on his GPS. He forgets appointments and even really writes things down. He does have long-standing depression and does take medications for that. Update 2/2/16PS : He returns for follow-up after last visit 4 months ago. He reports slight worsening of his left hand tremor which at times now interferes with his activities of daily living during the day as well. He has not tried a higher dose of Mysoline be on 125 mg at bedtime yet but is willing to do so. He states his memory difficulties are unchanged. He remains compliant with his depression medications and sees a psychiatrist regularly. He had lab work done at last visit on 10/16/13 which showed normal vitamin B12, TSH. RPR and HIV antibodies were negative. Carotid ultrasound done on 10/30/13 personally reviewed by me shows no significant extracranial stenosis. Update 10/28/2016PS : He returns for follow-up after last visit 8 months ago. Is accompanied by a friend [name]. He has noticed significant worsening of his memory particularly short-term. This has been gradual over several years but more recently. The patient states that he has been regularly seeing a psychiatrist [name] on a monthly basis and he is on Paxil 40 mg a day. On Mini-Mental status testing today scored 21/30 which is significant decline from 28/30 at last visit. Patient however remains independent in activities of daily living. He lives alone. He states his hand tremors are quite minimal and not bothersome. He remains on primidone 125 mg  twice daily. He denies any recent headaches, falls or head injuries. Prior to last visit he had vitamin B12, TSH and RPR tested which were normal. Update 1/5/2017PS : He returns for follow-up after last visit to 2 months ago. Is accompanied by his cousin [name]. Patient has noticed improvement in his memory and  cognition. He has been compliant with his depression medication which is taking without fail. Today scored 27/30 compared to 21/30 at last visit. He is independent in activities of daily living. He was seen by a psychiatrist's [name] who felt his depression was adequately treated and was concerned about his memory was actually getting worse. Patient himself actually denies this. He had EEG done on 01/08/15 which was normal which I personally reviewed and MRI scan of the brain on 12/04/14 which showed mild changes of chronic microvascular ischemia and atrophy which is unchanged compared with previous MRI. Update 8/4/2017PS : He returns for follow-up after last visit 7 months ago. He states his noticed worsening of his tremors. They involve mostly his hands but also at times his feet. He has trouble doing activities like holding a cup or newspaper. He can get some concentration. His tremors. He has been taking Mysoline 125 mg twice daily for his long-standing essential tremor but feels he needs more medicine now.  Patient states his memory difficulties remain unchanged and are not progressive. He feels his depression is adequately treated but on geriatric depression scale today scored 8 suggestive of mild depression. On Home he scored23/30 with deficits in recall and orientation.  UPDATE 10/25/2017CM [name] 68 year old male returns for follow-up. He has a history of stroke event in 2011 and was on Plavix until about a year ago. When asked if he can take aspirin he claims he had nausea and vomiting balance issues and dizziness from aspirin. He also has a history of essential tremor and is currently on Mysoline 250 at night he is not taking the morning dose and  his tremor is worse during the day he claims however I see very little tremor on exam. Could be related to anxiety. His memory difficulties are stable and his MOCA was not repeated today. He returns for reevaluation previous records reviewed UPDATE  11/16/2017CM [name], 68 year old male returns for follow-up. He was just seen the end of October. He has history of stroke event in 2011 and tremor. He has been on Mysoline which was increased at his last visit however he cannot see much improvement. He thinks his tremor is worse when sitting still, no difficulty specifically with eating. Has history of depression and see psychiatry. Mild resting tremor noted today on the right very intermittent no essential tremor. Says his sense of smell, comes and goes. He has had no gait difficulty or falls. He returns for reevaluation Update 03/14/2016 : He returns for follow-up after last visit 3 months ago. He had not noticed significant benefit of increasing the dose of Mysoline to 250 twice daily. He was asked to taper off and discontinue Mysoline instructed take Sinemet by Cecille Rubin nurse practitioner however the patient for unclear reason has chosen not to do it. He remains on Topamax 75 mg daily as well as Mysoline to 50 twice daily. Patient states that he caught driving under the influence of medications and has lost his driving license. He does complain of sleepiness and tiredness during the day. He remains on Plavix for stroke prevention is tolerating it well without side effects. He states his blood pressure well  controlled today was 131/82. Hie has  no further stroke or TIA symptoms. He states his eye memory difficulties unchanged and not getting worse. Update 09/12/2016 ; he returns for follow-up after last visit 6 months ago. Patient states that increasing the Topamax has not helped and in fact he feels his memory difficulties have gotten worse. He does have an appointment to do neuropsych testing next month. He was supposed to be on Topamax 75 twice daily but in fact has been taking it only once a day. He states his tremors are also worse particularly when he is anxious. He was meant to be on Mysoline 250 mg twice daily but has been taking it only once  daily. Patient does see a psychiatrist [name] for his depression which he feels is adequately controlled. He denies any recurrent symptoms of stroke or TIAs. He is tolerating Plavix well without bleeding or bruising. He states his blood pressure is well controlled and today it is 123/80. He is tolerating Mevacor well without muscle aches and pains. He cannot tell me when her last lipid profile was checked. Update 09/26/2017 : he returns for follow-up after last visit 5 months ago. He is accompanied by his daughter who is now his health power of attorney and is noted here from out of [place]. She is concerned about his short-term memory being quite poor. He has trouble remembering his medications and recent appointments. He had a tooth implant in June this year and following that case she notices significant worsening of his short-term memory and attention. Patient is trying some compensation strategies which are not helping. Patient is seeing his psychiatrist feels changes medications with some short-term benefit in his attention and memory but it seems to be bothering him again. Patient is quite disoriented at times and the daughter feels that he does much better during the morning but by the end of the day is more confused and disoriented. He has not had any falls or injuries. He denies any significant headaches. There has been no witnessed seizure activity. Update 12/05/2017 :He returns for follow-up after last visit 2 months ago.  He is accompanied by his daughter.  They report slight worsening of his hand tremors after his reduce the dose of primidone.  However he has also been started on new medication increase for tardive dyskinesia.  He has changes psychiatrist from [name] to Dr. Daron Offer.  He also plans to reduce his psychoactive medications at next visit.  Patient had noticed some lip and hand and vomiting movements which may be associated less but he has been on this new medication for 2 weeks only.   The patient continues to have mild cognitive impairment which appears to be unchanged.  He has not had stroke or TIA symptoms for many years.  He has no other new complaints. Update 03/12/2018 : He returns for follow-up after last visit 3 months ago.  He is accompanied by his daughter.  They have noticed improvement in his tremors and facial dyskinesias after starting increased for tardive dyskinesia.  They feel his depression and anxiety appear to be better controlled after recent medication changes by his new psychiatrist Dr. Daron Offer.  He continues to have short-term memory difficulties which appear mostly unchanged.  He has not had any recurrent stroke or TIA symptoms now for several years.  He recently underwent a MOCA test in his psychiatrist office 3 to 4 weeks ago and scored 23/30. REVIEW OF SYSTEMS: Full 14 system review of systems performed  and notable only for those listed, all others are neg:   , memory loss, tremors, frequent waking, depression and all other systems negative   ALLERGIES: Allergies  Allergen Reactions  . Acyclovir And Related   . Aspirin Other (See Comments)    Dizziness, nausea and vomiting Balance issues  . Lisinopril Other (See Comments)    Critical Cough  . Lithium Other (See Comments)    Critical Per pt reaction unknown  . Oxycodone Nausea And Vomiting  . Oxycodone-Acetaminophen Nausea And Vomiting    Also causes dizziness    HOME MEDICATIONS: Outpatient Medications Prior to Visit  Medication Sig Dispense Refill  . amLODipine (NORVASC) 10 MG tablet Take 10 mg by mouth daily.    . ARIPiprazole (ABILIFY) 5 MG tablet Take 5 mg by mouth daily.    . benztropine (COGENTIN) 0.5 MG tablet Take 0.5 mg 2 (two) times daily by mouth.  0  . clopidogrel (PLAVIX) 75 MG tablet Take 1 tablet (75 mg total) by mouth daily. 30 tablet 11  . diazepam (VALIUM) 5 MG tablet Take 5 mg daily as needed by mouth for anxiety.     . Fluocinonide 0.1 % CREA APPLY 2-3 GRAMS TO AFFECTED  AREAS 3-4 TIMES DAILY (1 GRAM = 1 DIME SIZE)  3  . losartan (COZAAR) 100 MG tablet Take 100 mg daily by mouth.     . lovastatin (MEVACOR) 10 MG tablet Take 10 mg by mouth at bedtime.    . modafinil (PROVIGIL) 200 MG tablet Take 200 mg by mouth daily.  0  . PARoxetine (PAXIL) 40 MG tablet Take 40 mg by mouth daily.  1  . prazosin (MINIPRESS) 2 MG capsule Take 2 mg by mouth at bedtime.    . primidone (MYSOLINE) 250 MG tablet Take 1 tablet (250 mg total) by mouth at bedtime. 60 tablet 11  . Valbenazine Tosylate (INGREZZA) 40 MG CAPS Take by mouth.    Marland Kitchen VIAGRA 100 MG tablet Take 100 mg daily as needed by mouth for erectile dysfunction.     Marland Kitchen REXULTI 3 MG TABS Take 3 mg every morning by mouth.   0   No facility-administered medications prior to visit.     PAST MEDICAL HISTORY: Past Medical History:  Diagnosis Date  . Depression   . Frequent urination   . GERD (gastroesophageal reflux disease)   . Herpes   . Hyperlipidemia   . Hypertension   . Memory loss   . Obesity   . Stroke ([place])   . Urgency of urination     PAST SURGICAL HISTORY: Past Surgical History:  Procedure Laterality Date  . disckec    . SPINAL FUSION    . VASECTOMY      FAMILY HISTORY: Family History  Problem Relation Age of Onset  . Hypertension Mother   . Hypertension Father   . Hypertension Sister   . Hypertension Brother   . Cancer Maternal Aunt   . Cancer - Ovarian Maternal Grandmother   . Diabetes Mellitus I Paternal Grandmother   . Hypertension Paternal Grandfather   . Cancer Maternal Aunt     SOCIAL HISTORY: Social History   Socioeconomic History  . Marital status: Legally Separated    Spouse name: Not on file  . Number of children: 1  . Years of education: Masters  . Highest education level: Not on file  Occupational History  . Occupation: retired    Fish farm manager: RETIRED  Social Needs  . Financial resource strain: Not on  file  . Food insecurity:    Worry: Not on file    Inability: Not on  file  . Transportation needs:    Medical: Not on file    Non-medical: Not on file  Tobacco Use  . Smoking status: Former Research scientist (life sciences)  . Smokeless tobacco: Never Used  Substance and Sexual Activity  . Alcohol use: No    Alcohol/week: 1.0 standard drinks    Types: 1 Cans of beer per week    Comment: socially  . Drug use: No  . Sexual activity: Never  Lifestyle  . Physical activity:    Days per week: Not on file    Minutes per session: Not on file  . Stress: Not on file  Relationships  . Social connections:    Talks on phone: Not on file    Gets together: Not on file    Attends religious service: Not on file    Active member of club or organization: Not on file    Attends meetings of clubs or organizations: Not on file    Relationship status: Not on file  . Intimate partner violence:    Fear of current or ex partner: Not on file    Emotionally abused: Not on file    Physically abused: Not on file    Forced sexual activity: Not on file  Other Topics Concern  . Not on file  Social History Narrative   Patient is separated.   Patient lives alone and has one child.   Patient is retired.   Patient is right-handed.   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   Patient does not drink any caffeine.     PHYSICAL EXAM  Vitals:   03/12/18 0935  BP: (!) 146/91  Pulse: 91  Weight: 185 lb 9.6 oz (84.2 kg)  Height: 5\' 7"  (1.702 m)   Body mass index is 29.07 kg/m. Generalized: Well developed middle aged african [place] male, in no acute distress   Head: normocephalic and atraumatic,   Neck: Supple, no carotid bruits  Cardiac: Regular rate rhythm, no murmur  Musculoskeletal: No deformity   Neurological examination  Mentation: Alert oriented to time, place, history taking. Follows all commands speech and language fluent .MMSE 28/30   ( last visit 28/30)  with deficits in recall only . Able to name 7 animals with 4 legs. 4/4 on clock drawing. Cranial nerve II-XII: Pupils were equal  round reactive to light extraocular movements were full, visual field were full on confrontational test. Facial sensation and strength were normal. hearing was intact to finger rubbing bilaterally. Uvula tongue midline. head turning and shoulder shrug and were normal and symmetric.Tongue protrusion into cheek strength was normal.  Motor: normal bulk and tone, full strength in the BUE, BLE, fine finger movements normal, no pronator drift. No focal weakness. Mild fine action upper extremities tremor noted today.  No cogwheel rigidity or bradykinesia.  Able to easily get up from the chair without assistance Sensory: normal and symmetric to light touch, pinprick, and vibration in the upper and lower extremities Coordination: finger-nose-finger, heel-to-shin bilaterally, no dysmetria  Gait and Station: Rising up from seated position without assistance, normal stance, without trunk ataxia, moderate stride, good arm swing, smooth turning,.Unable able to perform tiptoe, and heel walking without difficulty.  Reflexes: equal and symmetric   DIAGNOSTIC DATA (LABS, IMAGING, TESTING) -ASSESSMENT AND PLAN 87 year African American male with upper extremity resting tremor intermittently,  likely anxiety related.  History of right brain subcortical infarct  due to small vessel disease in December 2011 with no significant residual deficits. History of peripheral neuropathy diagnosis but no real clinical symptoms or signs on exam or on nerve conduction/EMG exam. Vascular risk factors of hypertension and hyperlipidemia. Mild memory difficulties likely due to underlying   depression versus mild cognitive impairment from remote stroke and multiple psychoactive medications. which appears stable  PLAN:  I had a long discussion with the patient and his daughter regarding his tremors, mild cognitive impairment, remote stroke, tardive dyskinesia and suboptimally treated depression and answered questions.  I recommend we continue  primidone 250 mg daily in the current dose  .  Continue follow-up with his new psychiatrist Dr. Daron Offer for optimization of his anxiety/depression medications.  Namenda start taking fish oil 1200 mg  daily as well as increase participation in cognitively challenging activities like solving crossword puzzles, playing bridge and sudoku.  He was also advised to do regular physical exercises daily.   We also discussed memory compensation strategies. Continue Plavix for stroke prevention and maintain aggressive risk factor modification.  Return for follow-up in 3 months with my nurse practitioner Janett Billow or call earlier if necessary..Greater than 50% time during this 25 minute visit was spent on counseling and coordination of care about his cognitive difficulties, tremors, stroke and answering questions.     Antony Contras, MD [facility] [address], [place], [place] [address] [phone]

## 2018-03-26 DIAGNOSIS — M19032 Primary osteoarthritis, left wrist: Secondary | ICD-10-CM | POA: Diagnosis not present

## 2018-03-29 DIAGNOSIS — F331 Major depressive disorder, recurrent, moderate: Secondary | ICD-10-CM | POA: Diagnosis not present

## 2018-04-05 DIAGNOSIS — M545 Low back pain: Secondary | ICD-10-CM | POA: Diagnosis not present

## 2018-04-06 DIAGNOSIS — M545 Low back pain: Secondary | ICD-10-CM | POA: Diagnosis not present

## 2018-04-13 DIAGNOSIS — M545 Low back pain: Secondary | ICD-10-CM | POA: Diagnosis not present

## 2018-04-16 DIAGNOSIS — F331 Major depressive disorder, recurrent, moderate: Secondary | ICD-10-CM | POA: Diagnosis not present

## 2018-04-17 DIAGNOSIS — M545 Low back pain: Secondary | ICD-10-CM | POA: Diagnosis not present

## 2018-04-19 DIAGNOSIS — M545 Low back pain: Secondary | ICD-10-CM | POA: Diagnosis not present

## 2018-04-25 DIAGNOSIS — M545 Low back pain: Secondary | ICD-10-CM | POA: Diagnosis not present

## 2018-04-26 DIAGNOSIS — M545 Low back pain: Secondary | ICD-10-CM | POA: Diagnosis not present

## 2018-04-30 DIAGNOSIS — F331 Major depressive disorder, recurrent, moderate: Secondary | ICD-10-CM | POA: Diagnosis not present

## 2018-04-30 DIAGNOSIS — M545 Low back pain: Secondary | ICD-10-CM | POA: Diagnosis not present

## 2018-05-03 DIAGNOSIS — M545 Low back pain: Secondary | ICD-10-CM | POA: Diagnosis not present

## 2018-05-07 DIAGNOSIS — M545 Low back pain: Secondary | ICD-10-CM | POA: Diagnosis not present

## 2018-05-10 DIAGNOSIS — M545 Low back pain: Secondary | ICD-10-CM | POA: Diagnosis not present

## 2018-05-11 DIAGNOSIS — F331 Major depressive disorder, recurrent, moderate: Secondary | ICD-10-CM | POA: Diagnosis not present

## 2018-05-14 DIAGNOSIS — M545 Low back pain: Secondary | ICD-10-CM | POA: Diagnosis not present

## 2018-05-17 DIAGNOSIS — M545 Low back pain: Secondary | ICD-10-CM | POA: Diagnosis not present

## 2018-05-18 DIAGNOSIS — R05 Cough: Secondary | ICD-10-CM | POA: Diagnosis not present

## 2018-05-18 DIAGNOSIS — F330 Major depressive disorder, recurrent, mild: Secondary | ICD-10-CM | POA: Diagnosis not present

## 2018-05-23 DIAGNOSIS — M545 Low back pain: Secondary | ICD-10-CM | POA: Diagnosis not present

## 2018-05-28 DIAGNOSIS — E785 Hyperlipidemia, unspecified: Secondary | ICD-10-CM | POA: Diagnosis not present

## 2018-05-28 DIAGNOSIS — F322 Major depressive disorder, single episode, severe without psychotic features: Secondary | ICD-10-CM | POA: Diagnosis not present

## 2018-05-28 DIAGNOSIS — G3184 Mild cognitive impairment, so stated: Secondary | ICD-10-CM | POA: Diagnosis not present

## 2018-05-28 DIAGNOSIS — I10 Essential (primary) hypertension: Secondary | ICD-10-CM | POA: Diagnosis not present

## 2018-05-28 DIAGNOSIS — F330 Major depressive disorder, recurrent, mild: Secondary | ICD-10-CM | POA: Diagnosis not present

## 2018-05-28 DIAGNOSIS — Z8673 Personal history of transient ischemic attack (TIA), and cerebral infarction without residual deficits: Secondary | ICD-10-CM | POA: Diagnosis not present

## 2018-05-28 DIAGNOSIS — A6000 Herpesviral infection of urogenital system, unspecified: Secondary | ICD-10-CM | POA: Diagnosis not present

## 2018-05-30 DIAGNOSIS — M545 Low back pain: Secondary | ICD-10-CM | POA: Diagnosis not present

## 2018-06-04 DIAGNOSIS — M545 Low back pain: Secondary | ICD-10-CM | POA: Diagnosis not present

## 2018-06-06 DIAGNOSIS — M545 Low back pain: Secondary | ICD-10-CM | POA: Diagnosis not present

## 2018-06-11 DIAGNOSIS — F330 Major depressive disorder, recurrent, mild: Secondary | ICD-10-CM | POA: Diagnosis not present

## 2018-06-12 ENCOUNTER — Telehealth: Payer: Self-pay

## 2018-06-12 DIAGNOSIS — M545 Low back pain: Secondary | ICD-10-CM | POA: Diagnosis not present

## 2018-06-12 NOTE — Telephone Encounter (Signed)
Left vm for patients daughter [name] that appt with Janett Billow NP on May 29 will be cancel due to office being closed. I stated on vm to r/s with Janett Billow NP. When they call back to r/s it will be a video visit due to [place] 19 pandemic. Pt needs to be r/s with Janett Billow NP thanks.

## 2018-06-14 DIAGNOSIS — M545 Low back pain: Secondary | ICD-10-CM | POA: Diagnosis not present

## 2018-06-15 ENCOUNTER — Ambulatory Visit: Payer: Medicare Other | Admitting: Adult Health

## 2018-06-20 ENCOUNTER — Ambulatory Visit: Payer: Medicare HMO | Admitting: Nurse Practitioner

## 2018-06-20 DIAGNOSIS — M545 Low back pain: Secondary | ICD-10-CM | POA: Diagnosis not present

## 2018-06-27 DIAGNOSIS — M19032 Primary osteoarthritis, left wrist: Secondary | ICD-10-CM | POA: Diagnosis not present

## 2018-06-27 DIAGNOSIS — M545 Low back pain: Secondary | ICD-10-CM | POA: Diagnosis not present

## 2018-06-27 DIAGNOSIS — M25332 Other instability, left wrist: Secondary | ICD-10-CM | POA: Diagnosis not present

## 2018-07-02 DIAGNOSIS — M545 Low back pain: Secondary | ICD-10-CM | POA: Diagnosis not present

## 2018-07-05 DIAGNOSIS — M545 Low back pain: Secondary | ICD-10-CM | POA: Diagnosis not present

## 2018-07-09 DIAGNOSIS — M545 Low back pain: Secondary | ICD-10-CM | POA: Diagnosis not present

## 2018-07-12 DIAGNOSIS — M545 Low back pain: Secondary | ICD-10-CM | POA: Diagnosis not present

## 2018-07-14 DIAGNOSIS — F331 Major depressive disorder, recurrent, moderate: Secondary | ICD-10-CM | POA: Diagnosis not present

## 2018-07-16 DIAGNOSIS — M545 Low back pain: Secondary | ICD-10-CM | POA: Diagnosis not present

## 2018-07-19 DIAGNOSIS — M545 Low back pain: Secondary | ICD-10-CM | POA: Diagnosis not present

## 2018-07-30 DIAGNOSIS — G2401 Drug induced subacute dyskinesia: Secondary | ICD-10-CM | POA: Diagnosis not present

## 2018-07-30 DIAGNOSIS — F332 Major depressive disorder, recurrent severe without psychotic features: Secondary | ICD-10-CM | POA: Diagnosis not present

## 2018-07-30 DIAGNOSIS — F0390 Unspecified dementia without behavioral disturbance: Secondary | ICD-10-CM | POA: Diagnosis not present

## 2018-07-30 DIAGNOSIS — F5101 Primary insomnia: Secondary | ICD-10-CM | POA: Diagnosis not present

## 2018-07-30 DIAGNOSIS — G218 Other secondary parkinsonism: Secondary | ICD-10-CM | POA: Diagnosis not present

## 2018-08-20 DIAGNOSIS — G218 Other secondary parkinsonism: Secondary | ICD-10-CM | POA: Diagnosis not present

## 2018-08-20 DIAGNOSIS — F0390 Unspecified dementia without behavioral disturbance: Secondary | ICD-10-CM | POA: Diagnosis not present

## 2018-08-20 DIAGNOSIS — F5101 Primary insomnia: Secondary | ICD-10-CM | POA: Diagnosis not present

## 2018-08-20 DIAGNOSIS — G2401 Drug induced subacute dyskinesia: Secondary | ICD-10-CM | POA: Diagnosis not present

## 2018-08-20 DIAGNOSIS — F332 Major depressive disorder, recurrent severe without psychotic features: Secondary | ICD-10-CM | POA: Diagnosis not present

## 2018-08-23 DIAGNOSIS — F422 Mixed obsessional thoughts and acts: Secondary | ICD-10-CM | POA: Diagnosis not present

## 2018-09-04 DIAGNOSIS — F422 Mixed obsessional thoughts and acts: Secondary | ICD-10-CM | POA: Diagnosis not present

## 2018-09-06 ENCOUNTER — Telehealth: Payer: Self-pay | Admitting: Neurology

## 2018-09-06 ENCOUNTER — Other Ambulatory Visit: Payer: Self-pay

## 2018-09-06 DIAGNOSIS — G250 Essential tremor: Secondary | ICD-10-CM

## 2018-09-06 MED ORDER — PRIMIDONE 250 MG PO TABS: 250.0000 mg | ORAL_TABLET | Freq: Every day | ORAL | 12 refills | Status: DC

## 2018-09-06 NOTE — Telephone Encounter (Signed)
I called pt and sent a refill for primidone for his tremors/shaking. PT verbalized understanding. I schedule a follow up with Christopher Billow NP due to pt appt being cancel due to office being close.

## 2018-09-06 NOTE — Telephone Encounter (Signed)
Pt does not recall the name of the medication for his shaking but he states he is in need of it.  Please call pt to discuss

## 2018-09-07 NOTE — Telephone Encounter (Signed)
Pt has called to inform that he no longer needs RN to call in the primidone, he states he has plenty of it.  No call back requested.  This is Pharmacist, hospital

## 2018-09-11 ENCOUNTER — Telehealth: Payer: Self-pay | Admitting: Neurology

## 2018-09-11 NOTE — Telephone Encounter (Signed)
Pt daughter(on DPR) has called to inform she will not make the appointment with pt but she wants Dr [name] and RN to know that pt's Dr Eksir(psychiatrist) has advised pt he needs to be placed in a retirement facility due to mental state.  Daughter is also asking for a call from RN after the appointment to discuss the appointment and what information was relayed to pt.

## 2018-09-11 NOTE — Telephone Encounter (Signed)
Noted! Thank you

## 2018-09-12 ENCOUNTER — Other Ambulatory Visit: Payer: Self-pay

## 2018-09-12 ENCOUNTER — Encounter: Payer: Self-pay | Admitting: Adult Health

## 2018-09-12 ENCOUNTER — Ambulatory Visit (INDEPENDENT_AMBULATORY_CARE_PROVIDER_SITE_OTHER): Payer: Medicare Other | Admitting: Adult Health

## 2018-09-12 VITALS — BP 156/88 | HR 80 | Temp 97.7°F | Ht 67.0 in | Wt 192.4 lb

## 2018-09-12 DIAGNOSIS — G250 Essential tremor: Secondary | ICD-10-CM

## 2018-09-12 DIAGNOSIS — G3184 Mild cognitive impairment, so stated: Secondary | ICD-10-CM

## 2018-09-12 DIAGNOSIS — Z8673 Personal history of transient ischemic attack (TIA), and cerebral infarction without residual deficits: Secondary | ICD-10-CM

## 2018-09-12 NOTE — Patient Instructions (Addendum)
Your Plan:  Continue current dose of primidone 250 mg daily  Continue to follow with your psychiatrist for ongoing anxiety and depression  Please let us know if you experience any worsening in your tremors such as interfering with daily activities or any worsening in memory/cognition   Follow-up in 6 months or call earlier if needed       Thank you for coming to see [place] at [facility]. I hope we have been able to provide you high quality care today.  You may receive a patient satisfaction survey over the next few weeks. We would appreciate your feedback and comments so that we may continue to improve ourselves and the health of our patients.

## 2018-09-12 NOTE — Progress Notes (Signed)
Of for of  [facility]  PATIENT: [name] DOB: [birth year]   REASON FOR VISIT: Follow-up for  tremor, memory loss, history of stroke HISTORY FROM: Patient  Chief Complaint  Patient presents with  . Follow-up    Room 9, Tremors and memory. "feels lost"       HISTORY OF PRESENT ILLNESS  [name] is a 68 year old male with prior history of stroke 2011, HTN, HLD, MCI and tremors who is being seen today for 38-month follow-up unaccompanied.  He has been being followed in this office since 2015.  He continues on primidone 250 mg daily with only mild tremors that do not interfere with daily activity.  He is questioning potential need of increasing dose due to residual tremors.  He endorses occasional increased anxiety for which she will take Valium for but overall anxiety and depression stable with ongoing follow-up by psychiatry.  MMSE slightly worse today than prior visit at 24/30 (prior 28/30) but subjectively feels as though his memory has been stable.  As daughter was unable to accompany patient at today's visit, she called yesterday to inform us that his current psychiatrist recommended patient to be placed in a retirement facility due to mental state.  Patient did attempt to call his daughter at beginning of visit but unable to reach.  He does feel as though he has recently been feeling "brain fog" but is able to function without difficulty.  Unable to determine if recent medication changes have been made through epic by psychiatry but patient denies recent changes.  Denies recurrent stroke/TIA symptoms.  Continues on clopidogrel and omega-3 without side effects for secondary stroke prevention.  No further concerns at this time.     REVIEW OF SYSTEMS: Full 14 system review of systems performed and notable only for those listed, all others are neg: Urgency and memory loss and all other systems negative      ALLERGIES: Allergies  Allergen Reactions  .  Acyclovir And Related   . Aspirin Other (See Comments)    Dizziness, nausea and vomiting Balance issues  . Lisinopril Other (See Comments)    Critical Cough  . Lithium Other (See Comments)    Critical Per pt reaction unknown  . Oxycodone Nausea And Vomiting  . Oxycodone-Acetaminophen Nausea And Vomiting    Also causes dizziness    HOME MEDICATIONS: Outpatient Medications Prior to Visit  Medication Sig Dispense Refill  . amLODipine (NORVASC) 10 MG tablet Take 10 mg by mouth daily.    . ARIPiprazole (ABILIFY) 5 MG tablet Take 5 mg by mouth daily.    . clopidogrel (PLAVIX) 75 MG tablet Take 1 tablet (75 mg total) by mouth daily. 30 tablet 11  . diazepam (VALIUM) 5 MG tablet Take 5 mg daily as needed by mouth for anxiety.     . Fluocinonide 0.1 % CREA APPLY 2-3 GRAMS TO AFFECTED AREAS 3-4 TIMES DAILY (1 GRAM = 1 DIME SIZE)  3  . losartan (COZAAR) 100 MG tablet Take 100 mg daily by mouth.     . lovastatin (MEVACOR) 10 MG tablet Take 10 mg by mouth at bedtime.    . modafinil (PROVIGIL) 200 MG tablet Take 200 mg by mouth daily.  0  . Omega-3 Fatty Acids (FISH OIL PO) Take 1,200 mg by mouth.    Marland Kitchen PARoxetine (PAXIL) 40 MG tablet Take 40 mg by mouth daily.  1  . prazosin (MINIPRESS) 2 MG capsule Take 2 mg by mouth at bedtime.    Marland Kitchen  primidone (MYSOLINE) 250 MG tablet Take 1 tablet (250 mg total) by mouth at bedtime. 60 tablet 12  . Valbenazine Tosylate (INGREZZA) 40 MG CAPS Take by mouth.    . benztropine (COGENTIN) 0.5 MG tablet Take 0.5 mg 2 (two) times daily by mouth.  0  . VIAGRA 100 MG tablet Take 100 mg daily as needed by mouth for erectile dysfunction.      No facility-administered medications prior to visit.     PAST MEDICAL HISTORY: Past Medical History:  Diagnosis Date  . Depression   . Frequent urination   . GERD (gastroesophageal reflux disease)   . Herpes   . Hyperlipidemia   . Hypertension   . Memory loss   . Obesity   . Stroke ([place])   . Urgency of urination      PAST SURGICAL HISTORY: Past Surgical History:  Procedure Laterality Date  . disckec    . SPINAL FUSION    . VASECTOMY      FAMILY HISTORY: Family History  Problem Relation Age of Onset  . Hypertension Mother   . Hypertension Father   . Hypertension Sister   . Hypertension Brother   . Cancer Maternal Aunt   . Cancer - Ovarian Maternal Grandmother   . Diabetes Mellitus I Paternal Grandmother   . Hypertension Paternal Grandfather   . Cancer Maternal Aunt     SOCIAL HISTORY: Social History   Socioeconomic History  . Marital status: Legally Separated    Spouse name: Not on file  . Number of children: 1  . Years of education: Masters  . Highest education level: Not on file  Occupational History  . Occupation: retired    Fish farm manager: RETIRED  Social Needs  . Financial resource strain: Not on file  . Food insecurity    Worry: Not on file    Inability: Not on file  . Transportation needs    Medical: Not on file    Non-medical: Not on file  Tobacco Use  . Smoking status: Former Research scientist (life sciences)  . Smokeless tobacco: Never Used  Substance and Sexual Activity  . Alcohol use: No    Alcohol/week: 1.0 standard drinks    Types: 1 Cans of beer per week    Comment: socially  . Drug use: No  . Sexual activity: Never  Lifestyle  . Physical activity    Days per week: Not on file    Minutes per session: Not on file  . Stress: Not on file  Relationships  . Social Herbalist on phone: Not on file    Gets together: Not on file    Attends religious service: Not on file    Active member of club or organization: Not on file    Attends meetings of clubs or organizations: Not on file    Relationship status: Not on file  . Intimate partner violence    Fear of current or ex partner: Not on file    Emotionally abused: Not on file    Physically abused: Not on file    Forced sexual activity: Not on file  Other Topics Concern  . Not on file  Social History Narrative   Patient is  separated.   Patient lives alone and has one child.   Patient is retired.   Patient is right-handed.   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   Patient does not drink any caffeine.     PHYSICAL EXAM  Vitals:   09/12/18 1009  BP: Marland Kitchen)  156/88  Pulse: 80  Temp: 97.7 F (36.5 C)  Weight: 192 lb 6.4 oz (87.3 kg)  Height: 5\' 7"  (1.702 m)   Body mass index is 30.13 kg/m. Generalized: Well developed middle aged african [place] male, in no acute distress   Head: normocephalic and atraumatic,   Neck: Supple, no carotid bruits  Cardiac: Regular rate rhythm, no murmur  Musculoskeletal: No deformity   MMSE - Mini Mental State Exam 09/12/2018 03/12/2018 09/26/2017  Orientation to time 3 4 3   Orientation to Place 4 5 5   Registration 3 3 3   Attention/ Calculation 2 5 5   Recall 3 2 3   Language- name 2 objects 2 2 2   Language- repeat 1 1 1   Language- follow 3 step command 3 3 3   Language- follow 3 step command-comments - - -  Language- read & follow direction 1 1 1   Write a sentence 1 1 1   Copy design 1 1 1   Copy design-comments named 3 animals - -  Total score 24 28 28     Neurological examination  Mentation: Alert oriented to time, place, history taking. Follows all commands speech and language fluent .MMSE 24/30 ( last visit 28/30)  Cranial nerve II-XII: Pupils were equal round reactive to light extraocular movements were full, visual field were full on confrontational test. Facial sensation and strength were normal. hearing was intact to finger rubbing bilaterally. Uvula tongue midline. head turning and shoulder shrug and were normal and symmetric.Tongue protrusion into cheek strength was normal.  Motor: normal bulk and tone, full strength in the BUE, BLE, fine finger movements normal, no pronator drift. No focal weakness. Mild fine action upper extremity tremor noted today.  No cogwheel rigidity or bradykinesia.  Able to easily get up from the chair without assistance Sensory: normal  and symmetric to light touch, pinprick, and vibration in the upper and lower extremities Coordination: finger-nose-finger, heel-to-shin bilaterally, no dysmetria  Gait and Station: Rising up from seated position without assistance, normal stance, without trunk ataxia, moderate stride, without evidence of balance difficulties Reflexes: equal and symmetric   DIAGNOSTIC DATA (LABS, IMAGING, TESTING) -ASSESSMENT AND PLAN 81 year African American male with upper extremity mild action tremor improved with use of primidone.  History of right brain subcortical infarct due to small vessel disease in December 2011 with no significant residual deficits. History of peripheral neuropathy diagnosis but no real clinical symptoms or signs on exam or on nerve conduction/EMG exam. Vascular risk factors of hypertension and hyperlipidemia. Mild memory difficulties likely due to underlying   depression versus mild cognitive impairment from remote stroke and multiple psychoactive medications.  MMSE slightly decreased at today's visit but subjectively patient reports stable.   PLAN:  -Continuation of primidone 250 mg daily without recommendation of increasing his mild residual tremors do not interfere with daily activity.  He was agreeable to this and aware to call office with any worsening -MMSE slightly decreased but subjectively denies.  He does report recently feeling "brain fog" with daughter reporting psychiatry recommended patient be placed in "retirement facility" but unable to view this recommendation or psychiatry documentation personally.  He does endorse continuing to perform daily activities and functioning without difficulty -He does not do memory exercises as recommended and this was highly encouraged to do so -Continue clopidogrel and omega-3 for secondary stroke prevention -He will continue to follow with psychiatry for ongoing anxiety and depression management  Follow-up in 6 months or call earlier if  needed   Greater than 50% time  during this 25 minute visit was spent on counseling and coordination of care about his cognitive difficulties, tremors, stroke and answering questions.     Frann Rider, AGNP-BC  [facility] [address] [place], [place] [address]  Phone [phone] Fax [phone] Note: This document was prepared with digital dictation and possible smart phrase technology. Any transcriptional errors that result from this process are unintentional.

## 2018-09-12 NOTE — Progress Notes (Signed)
I agree with the above plan 

## 2018-09-13 DIAGNOSIS — F422 Mixed obsessional thoughts and acts: Secondary | ICD-10-CM | POA: Diagnosis not present

## 2018-09-19 ENCOUNTER — Telehealth: Payer: Self-pay | Admitting: Adult Health

## 2018-09-19 DIAGNOSIS — R6889 Other general symptoms and signs: Secondary | ICD-10-CM | POA: Diagnosis not present

## 2018-09-19 DIAGNOSIS — G250 Essential tremor: Secondary | ICD-10-CM

## 2018-09-19 NOTE — Telephone Encounter (Signed)
I called pt and he was seen recently with JM/NP on 09/12/18. He states that he feels like the shaking, is worsening.  R hand and L leg.  This comes and goes.  No matter time of day.  It does affect his daily living (eating sometimes).  Taking the prmidone 250mg  po daily.  States that he bites his lip sometimes too. Related??  I will see what JM/NP recommends.

## 2018-09-19 NOTE — Telephone Encounter (Signed)
Pt states he is still experiencing the shaking, he is asking for a call to discuss increasing his dosage of his primidone (MYSOLINE) 250 MG tablet.  Please call

## 2018-09-20 NOTE — Telephone Encounter (Signed)
I called pt and relayed per Janett Billow, NP that he needs to discuss with psychiatrist first to see if this related to psych meds and if not then can look into changing dose of primidone.  He stated he understood.  Had episode this am increased shaking with R leg and accelerator of car.  I relayed that safety concern, and he is aware.  Will get back to [place] when after speaking with psych.

## 2018-09-20 NOTE — Telephone Encounter (Signed)
I would advise him to speak with his psychiatrist to ensure that he has worsening of tremors or tardive dyskinesia are not associated with current medication use through psychiatry.  If they feel as though his essential tremor symptoms are in fact worsening and not related to medication use or tardive dyskinesia, we may consider increasing dosage at that time

## 2018-09-27 ENCOUNTER — Telehealth: Payer: Self-pay | Admitting: Adult Health

## 2018-09-27 DIAGNOSIS — G250 Essential tremor: Secondary | ICD-10-CM

## 2018-09-27 MED ORDER — PRIMIDONE 250 MG PO TABS: 250.0000 mg | ORAL_TABLET | Freq: Two times a day (BID) | ORAL | 12 refills | Status: DC

## 2018-09-27 NOTE — Telephone Encounter (Signed)
I called pt, he had not called his psychiatrist regarding possibly other medications causing increased tremors or TD.  He states that his medications were checked by psychiatry prior to him getting primidone from Dr. [name].  He took extra  primidone this am and it helped calm the tremors.  He would like to know what can be done with primidone (increase dose) to help him.

## 2018-09-27 NOTE — Telephone Encounter (Signed)
Pt called stating that he was prescribed primidone (MYSOLINE) 250 MG tablet for the tremors he was having and now all of a sudden the tremors are back "with a vengeance". Pt would like to know if he is needing to change medications or dosage or what can be advised to him.

## 2018-10-01 NOTE — Telephone Encounter (Signed)
I called pt to make sure he did received email.  He stated that he did not.  He relayed that he had been taking primidone po bid for 1.5 weeks, then back tracked stated that it had not been that long (when I relayed that he did not tell me this when I spoke to him last).  He stated that he wrote in his journal that he was stopping the primidone.  He then stated that he was taking ingrezza for TD.  Given by Dr. Modena Morrow (psych) who was not taking medicare pts anymore that he was to find another psych.  I encouraged that he work on this, and as per recently discussed (speaking with psych about medications and possiblity of causing TD, ( I stated that he was taking primidone daily, he may want to continue this and then once speaking with psych to seek there recommendations).  Encouraged to find another psych but to f/u with current one right now.  He stated he would. Send to JM/NP as FYI.

## 2018-10-03 ENCOUNTER — Other Ambulatory Visit: Payer: Self-pay

## 2018-10-03 ENCOUNTER — Ambulatory Visit (INDEPENDENT_AMBULATORY_CARE_PROVIDER_SITE_OTHER): Payer: Medicare Other | Admitting: Adult Health

## 2018-10-03 ENCOUNTER — Encounter: Payer: Self-pay | Admitting: Adult Health

## 2018-10-03 VITALS — BP 141/91 | HR 85 | Temp 97.8°F | Ht 67.0 in | Wt 191.6 lb

## 2018-10-03 DIAGNOSIS — F329 Major depressive disorder, single episode, unspecified: Secondary | ICD-10-CM

## 2018-10-03 DIAGNOSIS — G250 Essential tremor: Secondary | ICD-10-CM

## 2018-10-03 DIAGNOSIS — Z8673 Personal history of transient ischemic attack (TIA), and cerebral infarction without residual deficits: Secondary | ICD-10-CM

## 2018-10-03 DIAGNOSIS — G3184 Mild cognitive impairment, so stated: Secondary | ICD-10-CM | POA: Diagnosis not present

## 2018-10-03 DIAGNOSIS — F32A Depression, unspecified: Secondary | ICD-10-CM

## 2018-10-03 MED ORDER — PRIMIDONE 250 MG PO TABS: 250.0000 mg | ORAL_TABLET | Freq: Two times a day (BID) | ORAL | 12 refills | Status: DC

## 2018-10-03 NOTE — Progress Notes (Signed)
Of for of  [facility]  PATIENT: [name] DOB: January 26, 1950   REASON FOR VISIT: Follow-up for  tremor, memory loss, history of stroke HISTORY FROM: Patient  Chief Complaint  Patient presents with   Follow-up    Worsening tremor. Alone. Treatment room. Patient mentioned that his tremors started to get worse 2-3 weeks ago. He stated that he has the "shakes" in his hands and feet really bad.       HISTORY OF PRESENT ILLNESS  Update 09/12/2018: [name] is a 68 year old male with prior history of stroke 2011, HTN, HLD, MCI and tremors who is being seen today for 31-month follow-up unaccompanied.  He has been being followed in this office since 2015.  He continues on primidone 250 mg daily with only mild tremors that do not interfere with daily activity.  He is questioning potential need of increasing dose due to residual tremors.  He endorses occasional increased anxiety for which she will take Valium for but overall anxiety and depression stable with ongoing follow-up by psychiatry.  MMSE slightly worse today than prior visit at 24/30 (prior 28/30) but subjectively feels as though his memory has been stable.  As daughter was unable to accompany patient at today's visit, she called yesterday to inform us that his current psychiatrist recommended patient to be placed in a retirement facility due to mental state.  Patient did attempt to call his daughter at beginning of visit but unable to reach.  He does feel as though he has recently been feeling "brain fog" but is able to function without difficulty.  Unable to determine if recent medication changes have been made through epic by psychiatry but patient denies recent changes.  Denies recurrent stroke/TIA symptoms.  Continues on clopidogrel and omega-3 without side effects for secondary stroke prevention.  No further concerns at this time.  Update 10/03/2018: [name] is being seen today per patient request due to  worsening of tremors.  Patient initially presented alone he states he increase primidone dose to 250 mg twice daily which he took for 1.5 weeks but discontinued 2 days ago as he did not have any improvement.  He also endorses not currently being followed by psychiatry and is in the process of looking for a new psychiatrist.  He denies any changes in mood or increased depression.  He does endorse ongoing compliance with psychiatric medications.  Daughter then presented to visit who was unaware of patient discontinuing primidone and after further investigating, he was actually only taking primidone for 4 days prior to discontinuing.  Daughter does endorse patient stating worsening depression with decreased appetite, decreased energy and history of worsening depression with season changes.  He does have follow-up scheduled with his current psychiatrist on 10/15/2018 but is currently in the process of finding a new psychiatrist as he will not be continuing to accept Medicare insurance.  Daughter is currently in the process of finding a retirement/independent/assisted living facility for patient as recommended by his psychiatrist due to slowly worsening of memory/cognition.  He currently lives alone.  Daughter is in the process of changing pharmacies to obtain pill packs to assist patient with medication compliance.       REVIEW OF SYSTEMS: Full 14 system review of systems performed and notable only for those listed, all others are neg: Memory loss, tremors, depression and all other systems negative      ALLERGIES: Allergies  Allergen Reactions   Acyclovir And Related    Aspirin Other (See Comments)  Dizziness, nausea and vomiting Balance issues   Lisinopril Other (See Comments)    Critical Cough   Lithium Other (See Comments)    Critical Per pt reaction unknown   Oxycodone Nausea And Vomiting   Oxycodone-Acetaminophen Nausea And Vomiting    Also causes dizziness    HOME  MEDICATIONS: Outpatient Medications Prior to Visit  Medication Sig Dispense Refill   amLODipine (NORVASC) 10 MG tablet Take 10 mg by mouth daily.     ARIPiprazole (ABILIFY) 5 MG tablet Take 5 mg by mouth daily.     clopidogrel (PLAVIX) 75 MG tablet Take 1 tablet (75 mg total) by mouth daily. 30 tablet 11   diazepam (VALIUM) 5 MG tablet Take 5 mg daily as needed by mouth for anxiety.      Fluocinonide 0.1 % CREA APPLY 2-3 GRAMS TO AFFECTED AREAS 3-4 TIMES DAILY (1 GRAM = 1 DIME SIZE)  3   losartan (COZAAR) 100 MG tablet Take 100 mg daily by mouth.      lovastatin (MEVACOR) 10 MG tablet Take 10 mg by mouth at bedtime.     modafinil (PROVIGIL) 200 MG tablet Take 200 mg by mouth daily.  0   Omega-3 Fatty Acids (FISH OIL PO) Take 1,200 mg by mouth.     PARoxetine (PAXIL) 40 MG tablet Take 40 mg by mouth daily.  1   prazosin (MINIPRESS) 2 MG capsule Take 2 mg by mouth at bedtime.     Valbenazine Tosylate (INGREZZA) 40 MG CAPS Take by mouth.     primidone (MYSOLINE) 250 MG tablet Take 1 tablet (250 mg total) by mouth 2 (two) times daily. (Patient not taking: Reported on 10/03/2018) 60 tablet 12   No facility-administered medications prior to visit.     PAST MEDICAL HISTORY: Past Medical History:  Diagnosis Date   Depression    Frequent urination    GERD (gastroesophageal reflux disease)    Herpes    Hyperlipidemia    Hypertension    Memory loss    Obesity    Stroke ([place])    Urgency of urination     PAST SURGICAL HISTORY: Past Surgical History:  Procedure Laterality Date   disckec     SPINAL FUSION     VASECTOMY      FAMILY HISTORY: Family History  Problem Relation Age of Onset   Hypertension Mother    Hypertension Father    Hypertension Sister    Hypertension Brother    Cancer Maternal Aunt    Cancer - Ovarian Maternal Grandmother    Diabetes Mellitus I Paternal Grandmother    Hypertension Paternal Grandfather    Cancer Maternal Aunt      SOCIAL HISTORY: Social History   Socioeconomic History   Marital status: Legally Separated    Spouse name: Not on file   Number of children: 1   Years of education: Masters   Highest education level: Not on file  Occupational History   Occupation: retired    Fish farm manager: RETIRED  Scientist, product/process development strain: Not on file   Food insecurity    Worry: Not on file    Inability: Not on file   Transportation needs    Medical: Not on file    Non-medical: Not on file  Tobacco Use   Smoking status: Former Smoker   Smokeless tobacco: Never Used  Substance and Sexual Activity   Alcohol use: No    Alcohol/week: 1.0 standard drinks    Types: 1 Cans of  beer per week    Comment: socially   Drug use: No   Sexual activity: Never  Lifestyle   Physical activity    Days per week: Not on file    Minutes per session: Not on file   Stress: Not on file  Relationships   Social connections    Talks on phone: Not on file    Gets together: Not on file    Attends religious service: Not on file    Active member of club or organization: Not on file    Attends meetings of clubs or organizations: Not on file    Relationship status: Not on file   Intimate partner violence    Fear of current or ex partner: Not on file    Emotionally abused: Not on file    Physically abused: Not on file    Forced sexual activity: Not on file  Other Topics Concern   Not on file  Social History Narrative   Patient is separated.   Patient lives alone and has one child.   Patient is retired.   Patient is right-handed.   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   Patient does not drink any caffeine.     PHYSICAL EXAM  Vitals:   10/03/18 0923  BP: (!) 141/91  Pulse: 85  Temp: 97.8 F (36.6 C)  TempSrc: Oral  Weight: 191 lb 9.6 oz (86.9 kg)  Height: 5\' 7"  (1.702 m)   Body mass index is 30.01 kg/m. Generalized: Well developed middle aged african [place] male, in no acute  distress   Head: normocephalic and atraumatic,   Neck: Supple, no carotid bruits  Cardiac: Regular rate rhythm, no murmur  Musculoskeletal: No deformity   Neurological examination  Mentation: Alert oriented to time, place, history taking. Follows all commands speech and language fluent.  Knowledge of information diminished.  Short-term memory diminished. Cranial nerve II-XII: Pupils were equal round reactive to light extraocular movements were full, visual field were full on confrontational test. Facial sensation and strength were normal. hearing was intact to finger rubbing bilaterally. Uvula tongue midline. head turning and shoulder shrug and were normal and symmetric.Tongue protrusion into cheek strength was normal.  Motor: normal bulk and tone, full strength in the BUE, BLE, fine finger movements normal, no pronator drift. No focal weakness.  Moderate action tremors noted in bilateral upper extremities and bilateral lower extremities with mild tremoring at rest Sensory: normal and symmetric to light touch, pinprick, and vibration in the upper and lower extremities Gait and Station: Rising up from seated position without assistance, normal stance, without trunk ataxia, moderate stride, without evidence of balance difficulties Reflexes: equal and symmetric   DIAGNOSTIC DATA (LABS, IMAGING, TESTING) -ASSESSMENT AND PLAN  22 year African American male with upper extremity action tremor with recent onset of worsening likely due to increased depression/stress and discontinuation of primidone.  History of right brain subcortical infarct due to small vessel disease in December 2011 with no significant residual deficits. Vascular risk factors of hypertension and hyperlipidemia. Mild memory difficulties likely due to underlying   depression versus mild cognitive impairment from remote stroke and multiple psychoactive medications.  Discussion regarding potential worsening of tremors likely due to worsening  depression and increased stress with the family discussing moving patient to a retirement community versus independent/assisted living facility due to worsening memory as well as discontinuing primidone   PLAN:  -Advised to restart primidone 250 mg twice daily -He does have scheduled follow-up with psychiatry at  the end of this month -Discussion regarding importance of continuation of medications and did not discontinue or take extra dose without discussion with provider.  If worsening tremors continue, may consider addition of propranolol or evaluation by movement specialist as he does not believe his symptoms are tremors but potentially related to something else -Continue clopidogrel and omega-3 for secondary stroke prevention -He will continue to follow with psychiatry for ongoing anxiety and depression management  Follow-up as scheduled   Greater than 50% time during this 25 minute visit was spent on counseling and coordination of care about his cognitive difficulties, tremors, stroke and answering questions along with discussion regarding importance of medication compliance and following up with psychiatry for worsening depression.     Frann Rider, AGNP-BC  [facility] [address] [place], [place] [address]  Phone [phone] Fax [phone] Note: This document was prepared with digital dictation and possible smart phrase technology. Any transcriptional errors that result from this process are unintentional.

## 2018-10-03 NOTE — Patient Instructions (Addendum)
Your Plan:  Restart Primidone 250mg  twice daily  Follow up with your psychiatrist on 9/28 for further evalatuion of potential underlying depression/anxiety causing worsening of tremors  If you continue to have worsening, let us know and we can consider initiating propranolol for further benefit          Thank you for coming to see [place] at [facility]. I hope we have been able to provide you high quality care today.  You may receive a patient satisfaction survey over the next few weeks. We would appreciate your feedback and comments so that we may continue to improve ourselves and the health of our patients.

## 2018-10-03 NOTE — Progress Notes (Signed)
I agree with the above plan 

## 2018-10-09 NOTE — Telephone Encounter (Signed)
error 

## 2018-10-15 ENCOUNTER — Telehealth: Payer: Self-pay

## 2018-10-15 DIAGNOSIS — G2401 Drug induced subacute dyskinesia: Secondary | ICD-10-CM | POA: Diagnosis not present

## 2018-10-15 DIAGNOSIS — F5101 Primary insomnia: Secondary | ICD-10-CM | POA: Diagnosis not present

## 2018-10-15 DIAGNOSIS — F332 Major depressive disorder, recurrent severe without psychotic features: Secondary | ICD-10-CM | POA: Diagnosis not present

## 2018-10-15 DIAGNOSIS — G218 Other secondary parkinsonism: Secondary | ICD-10-CM | POA: Diagnosis not present

## 2018-10-15 DIAGNOSIS — F0390 Unspecified dementia without behavioral disturbance: Secondary | ICD-10-CM | POA: Diagnosis not present

## 2018-10-15 NOTE — Telephone Encounter (Signed)
I called pt.  He stated that his tremors are really bad, arms/hands all the time, both legs too now since starting the primidone 250mg  po bid.  He wanted you to know.

## 2018-10-15 NOTE — Telephone Encounter (Signed)
Patient called and stated that he would like to discuss some tremors that his is experiencing. Call back number [phone].

## 2018-10-16 MED ORDER — PROPRANOLOL HCL 60 MG PO TABS: 60.0000 mg | ORAL_TABLET | Freq: Every day | ORAL | 3 refills | Status: DC

## 2018-10-16 NOTE — Telephone Encounter (Signed)
He is interested in taking the propranolol.

## 2018-10-16 NOTE — Telephone Encounter (Signed)
Order placed to initiate propranolol 60mg  daily in addition to primidone 250mg  BID due to ongoing tremors

## 2018-10-16 NOTE — Telephone Encounter (Signed)
I called pt back and relayed that propranolol was called in for him to help with worsening tremors.   This was called in already.  He will call back if problems, SE, questions.

## 2018-10-16 NOTE — Telephone Encounter (Signed)
As discussed with him and his daughter during appointment, recommend initiating propranolol 60 mg daily in addition to his primidone 250 mg twice daily.  It will be important to ensure adequate compliance with these 2 medications combined.  If he is interested in initiating propranolol, please let me know and order will be placed.

## 2018-10-16 NOTE — Addendum Note (Signed)
Addended by: Mal Misty on: 10/16/2018 12:39 PM   Modules accepted: Orders

## 2018-10-19 ENCOUNTER — Telehealth: Payer: Self-pay | Admitting: Adult Health

## 2018-10-19 NOTE — Telephone Encounter (Signed)
Pt called stating that the tremors or not getting any better and would like to discuss with RN. Please advise.

## 2018-10-22 NOTE — Telephone Encounter (Signed)
Pt called in and stated he would like to speak with someone about his tremors they are getting worse. Pt is requesting a call back

## 2018-10-22 NOTE — Telephone Encounter (Signed)
I called daughter, [name].  She stated that they did speak (VIDEO VISIT) 10-15-18 that he did change some medications to try to assist with the tremors, (stop modafinil, decrease abilify 2mg  daily-do for 3 wks, and remeron 15mg  po daily). Last Thursday she went with pt to another appt and did notice tremors were less.  I relayed that pt is stating he is on propranolol 60mg  po daily and she stated that she last went to pharmacy on 10-15-18 and he is not taking this right now.   She is doing his pill box.  They are trying to get [place] to do pillpack (bubblepacks).  Right now will see how pt will do for the next 2 wks from what the psychiatrist changes, if tremors worsening, can add propranolol 60mg  po daily and see how he does (for 2wks and see how these meds changes affect tremors).  DAUGHTER IS DOING MED BOXES.  ;I will relay to pt the plan.

## 2018-10-22 NOTE — Telephone Encounter (Signed)
Recommended initiating propranolol on 10/15/2018 and patient called into office on 10/19/2018.  Potential use of propranolol in addition to primidone total of 4 days.  As discussed at prior visit, he needs to give these medications more time to take effect in his system.  May need to reach out to his daughter to ensure he is taking these medications consistently as well as ensuring he followed up with his psychiatrist as it was discussed at prior visit potential worsening of depression/anxiety.

## 2018-10-22 NOTE — Telephone Encounter (Signed)
I explained the plan to pt, per below (monitor for 2wk, any med changes that are made).  He verbalized understanding.

## 2018-10-22 NOTE — Telephone Encounter (Signed)
I called pt and he states that they propranolol has not helped.  (they are affecting his life now).

## 2018-10-23 DIAGNOSIS — F332 Major depressive disorder, recurrent severe without psychotic features: Secondary | ICD-10-CM | POA: Diagnosis not present

## 2018-10-30 NOTE — Telephone Encounter (Signed)
Pt daughter(on DPR) is asking for a call from RN to discuss pt telling her that his tremors are unbearable.  Daughter is wanting to discuss the new medication previously discussed

## 2018-10-31 ENCOUNTER — Telehealth: Payer: Self-pay

## 2018-10-31 NOTE — Telephone Encounter (Signed)
I would hold off on starting the propranolol until all the other changes are made because his tremor may be better after the other med changes an he may not need propranolol. thanks

## 2018-11-01 NOTE — Telephone Encounter (Signed)
Returned call regarding their questions after speaking with the fill in Doctor, Dr.Ahern.  Gave them her suggestions.   [name] informed me that [name] tremors are worsening and would like to see [name] sooner than the February appointment. [name] feels as though [name] memory has worsened and she is concerned about him living alone.  She also informed me that psychiatry suggests that he no longer drives. Also that psychiatry states that he go into assisted living.  [name] [name] also informed me that he is no longer eating like he should.

## 2018-11-05 ENCOUNTER — Telehealth: Payer: Self-pay | Admitting: Adult Health

## 2018-11-05 NOTE — Telephone Encounter (Signed)
Pt called stating he is having uncontrollable tremors and he is needing to discuss what can be done with the provider or RN. Please advise.

## 2018-11-06 NOTE — Telephone Encounter (Signed)
I called to f/u on the pt.  Spoke to daughter.  Pt has appt 12-20-18.  She was ok with this.  Pt is continuing with decreasing the abilify per psych.  Last 2 days will be this sun and mon 10-25/ 10-26.  No modafinil.  Has tardive dyskinesia for the last 20 yrs.  Psych wanted to do transcranial magnetic stimulation but due to his memory and was not comfortable doing this.  I relayed JM/NP last message.  She is aware.   He should not drive, per daughter or live alone. Pt resisting.  Speak with pcp (has appt in nov).  She will call back as needed.  Wanted [place] to be aware of what was going on.

## 2018-11-06 NOTE — Telephone Encounter (Signed)
See other note

## 2018-11-06 NOTE — Telephone Encounter (Signed)
Please advise daughter that they have to give more time for the medication changes to take effect.  His worsening tremors are likely multifactorial in regards to medications, worsening memory and possible increased depression/anxiety as discussed during visit.  Please advise to continue current medication regimen as recommended by his PCP.  PCP/psychology can assist further regarding assisted living

## 2018-11-15 DIAGNOSIS — F332 Major depressive disorder, recurrent severe without psychotic features: Secondary | ICD-10-CM | POA: Diagnosis not present

## 2018-11-19 DIAGNOSIS — Z8673 Personal history of transient ischemic attack (TIA), and cerebral infarction without residual deficits: Secondary | ICD-10-CM | POA: Diagnosis not present

## 2018-11-19 DIAGNOSIS — N289 Disorder of kidney and ureter, unspecified: Secondary | ICD-10-CM | POA: Diagnosis not present

## 2018-11-19 DIAGNOSIS — Z125 Encounter for screening for malignant neoplasm of prostate: Secondary | ICD-10-CM | POA: Diagnosis not present

## 2018-11-19 DIAGNOSIS — G3184 Mild cognitive impairment, so stated: Secondary | ICD-10-CM | POA: Diagnosis not present

## 2018-11-19 DIAGNOSIS — I10 Essential (primary) hypertension: Secondary | ICD-10-CM | POA: Diagnosis not present

## 2018-11-19 DIAGNOSIS — E785 Hyperlipidemia, unspecified: Secondary | ICD-10-CM | POA: Diagnosis not present

## 2018-11-26 DIAGNOSIS — F332 Major depressive disorder, recurrent severe without psychotic features: Secondary | ICD-10-CM | POA: Diagnosis not present

## 2018-11-27 NOTE — Telephone Encounter (Signed)
Phone rep checked office voicemails; pt left a voicemail @11 :31 stating that his shakes are getting worse.  Please call

## 2018-11-28 NOTE — Addendum Note (Signed)
Addended by: Brandon Melnick on: 11/28/2018 10:33 AM   Modules accepted: Orders

## 2018-11-28 NOTE — Telephone Encounter (Signed)
Spoke to daughter updated list of medications.  She noted that pt will see Dr. Toy Care on Monday.  Pt has tardive dykinesia. Other options other then medications.  She states that hopefully other options will be addressed at this appt (movement d/o specialist).

## 2018-11-29 DIAGNOSIS — F332 Major depressive disorder, recurrent severe without psychotic features: Secondary | ICD-10-CM | POA: Diagnosis not present

## 2018-12-03 ENCOUNTER — Ambulatory Visit (INDEPENDENT_AMBULATORY_CARE_PROVIDER_SITE_OTHER): Payer: Medicare Other | Admitting: Psychiatry

## 2018-12-03 ENCOUNTER — Encounter: Payer: Self-pay | Admitting: Psychiatry

## 2018-12-03 ENCOUNTER — Other Ambulatory Visit: Payer: Self-pay

## 2018-12-03 DIAGNOSIS — G2401 Drug induced subacute dyskinesia: Secondary | ICD-10-CM

## 2018-12-03 DIAGNOSIS — F331 Major depressive disorder, recurrent, moderate: Secondary | ICD-10-CM | POA: Diagnosis not present

## 2018-12-03 MED ORDER — QUETIAPINE FUMARATE 50 MG PO TABS: 50.0000 mg | ORAL_TABLET | Freq: Every day | ORAL | 1 refills | Status: DC

## 2018-12-03 MED ORDER — PAROXETINE HCL 40 MG PO TABS: 40.0000 mg | ORAL_TABLET | Freq: Every day | ORAL | 1 refills | Status: DC

## 2018-12-03 MED ORDER — INGREZZA 80 MG PO CAPS: 80.0000 mg | ORAL_CAPSULE | Freq: Every day | ORAL | 1 refills | Status: DC

## 2018-12-03 NOTE — Progress Notes (Signed)
Psychiatric Initial Adult Assessment   I connected with  [name] on 12/03/18 by a video enabled telemedicine application and verified that I am speaking with the correct person using two identifiers.   I discussed the limitations of evaluation and management by telemedicine. The patient and his daughter (power of attorney) expressed understanding and agreed to proceed.   Patient Identification: [name] MRN:  [id number] Date of Evaluation:  12/03/2018   Referral Source: Dr. Daron Offer, Psychiatrist  Chief Complaint:   " I am depressed and can you do something about these tremors?"  Visit Diagnosis:    ICD-10-CM   1. Moderate episode of recurrent major depressive disorder (HCC)  F33.1 QUEtiapine (SEROQUEL) 50 MG tablet    PARoxetine (PAXIL) 40 MG tablet  2. Tardive dyskinesia  G24.01 Valbenazine Tosylate (INGREZZA) 80 MG CAPS    History of Present Illness:  This is a 68 y/o male with hx of MDD and Tardive Dyskinesia and mild cognitive impairment in addition to s/p CVA (2011), HTN, HLD now seen for evaluation. He was being seen by psychiatrist Dr. Daron Offer. His case was referred out after his insurance was no longer accepted by the previous psychiatry clinic. Pt stated that he has been suffering from depression for a long time. He was on Paxil and Abilify was added to his regimen for optimal control of symptoms. He eventually developed Tardive Dyskinesia. Abilify was subsequently discontinued which did not improve the bilateral hand tremors much. Subsequently, Ingrezza 40 mg was added to his regimen.  Daughter informed that ingrezza helped initially, however, over last 3 months his tremors in bilateral upper extremities have returned back.  Pt stated that the tremors are distressing to him. They wake him up from sleep. He reported he has been sleeping poorly and has lost his appetite. Mirtazapine was added to paxil a few weeks ago which the daughter and pt reported did not help  much. Pt reported being on 60 mg of Paxil in the past but stated he was unable to tolerate it. He has taken trazodone with minimal effect in the past. He denied any hallucinations or delusions. He denied any suicidal ideations or plans to hurt himself.  Daughter informed that lately there have been concerns about his recent memory. His previous psychiatrist has advised him not to drive anymore.  He was also been followed by neurology. Was prescribed Primidone for essential tremors in the past. Does was reduced eventually but the dose was increased back to 250 mg BID at the time of last visit in September 2020. Daughter informed he was also prescribed Valium for anxiety as needed which he found helpful to some degree.  Daughter stated that she has been encouraging her father to go for walks on a regular basis to increase his level of physical activity. She also informed that he lives by himself and due to him not taking his medications correctly she has the pharmacy packs them in wraps for daily use basis. She showed the writer the bag during the session.   Associated Signs/Symptoms: Depression Symptoms:  Present, See HPI (Hypo) Manic Symptoms:  Denied Anxiety Symptoms:  Anxious about tremors Psychotic Symptoms:  Denied PTSD Symptoms: Denied  Past Psychiatric History: Long hx of   Previous Psychotropic Medications: Yes   Substance Abuse History in the last 12 months:  No.  Consequences of Substance Abuse: Negative  Past Medical History:  Past Medical History:  Diagnosis Date  . Depression   . Frequent urination   .  GERD (gastroesophageal reflux disease)   . Herpes   . Hyperlipidemia   . Hypertension   . Memory loss   . Obesity   . Stroke ([place])   . Urgency of urination     Past Surgical History:  Procedure Laterality Date  . disckec    . SPINAL FUSION    . VASECTOMY      Family Psychiatric History: denied  Family History:  Family History  Problem Relation Age of  Onset  . Hypertension Mother   . Hypertension Father   . Hypertension Sister   . Hypertension Brother   . Cancer Maternal Aunt   . Cancer - Ovarian Maternal Grandmother   . Diabetes Mellitus I Paternal Grandmother   . Hypertension Paternal Grandfather   . Cancer Maternal Aunt     Social History:   Social History   Socioeconomic History  . Marital status: Legally Separated    Spouse name: Not on file  . Number of children: 1  . Years of education: Masters  . Highest education level: Not on file  Occupational History  . Occupation: retired    Fish farm manager: RETIRED  Social Needs  . Financial resource strain: Not on file  . Food insecurity    Worry: Not on file    Inability: Not on file  . Transportation needs    Medical: Not on file    Non-medical: Not on file  Tobacco Use  . Smoking status: Former Research scientist (life sciences)  . Smokeless tobacco: Never Used  Substance and Sexual Activity  . Alcohol use: No    Alcohol/week: 1.0 standard drinks    Types: 1 Cans of beer per week    Comment: socially  . Drug use: No  . Sexual activity: Never  Lifestyle  . Physical activity    Days per week: Not on file    Minutes per session: Not on file  . Stress: Not on file  Relationships  . Social Herbalist on phone: Not on file    Gets together: Not on file    Attends religious service: Not on file    Active member of club or organization: Not on file    Attends meetings of clubs or organizations: Not on file    Relationship status: Not on file  Other Topics Concern  . Not on file  Social History Narrative   Patient is separated.   Patient lives alone and has one child.   Patient is retired.   Patient is right-handed.   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   Patient does not drink any caffeine.    Additional Social History: Lives alone, daughter is supportive and his legal POA  Allergies:   Allergies  Allergen Reactions  . Acyclovir And Related   . Aspirin Other (See Comments)     Dizziness, nausea and vomiting Balance issues  . Lisinopril Other (See Comments)    Critical Cough  . Lithium Other (See Comments)    Critical Per pt reaction unknown  . Oxycodone Nausea And Vomiting  . Oxycodone-Acetaminophen Nausea And Vomiting    Also causes dizziness    Metabolic Disorder Labs: Lab Results  Component Value Date   HGBA1C 5.5 05/29/2014   MPG 120 (H) 12/20/2009   No results found for: PROLACTIN Lab Results  Component Value Date   CHOL 150 05/29/2014   TRIG 284 (H) 05/29/2014   HDL 30 (L) 05/29/2014   CHOLHDL 5.0 05/29/2014   VLDL 49 (H) 12/20/2009  LDLCALC 63 05/29/2014   LDLCALC (H) 12/20/2009    149        Total Cholesterol/HDL:CHD Risk Coronary Heart Disease Risk Table                     Men   Women  1/2 Average Risk   3.4   3.3  Average Risk       5.0   4.4  2 X Average Risk   9.6   7.1  3 X Average Risk  23.4   11.0        Use the calculated Patient Ratio above and the CHD Risk Table to determine the patient's CHD Risk.        ATP III CLASSIFICATION (LDL):  <100     mg/dL   Optimal  100-129  mg/dL   Near or Above                    Optimal  130-159  mg/dL   Borderline  160-189  mg/dL   High  >190     mg/dL   Very High   Lab Results  Component Value Date   TSH 2.850 10/16/2013    Therapeutic Level Labs: No results found for: LITHIUM Lab Results  Component Value Date   CBMZ 2.0 (L) 10/29/2010   No results found for: VALPROATE  Current Medications: Current Outpatient Medications  Medication Sig Dispense Refill  . amLODipine (NORVASC) 10 MG tablet Take 10 mg by mouth daily.    . candesartan (ATACAND) 16 MG tablet Take 32 mg by mouth daily.     . clopidogrel (PLAVIX) 75 MG tablet Take 1 tablet (75 mg total) by mouth daily. 30 tablet 11  . Fluocinonide 0.1 % CREA APPLY 2-3 GRAMS TO AFFECTED AREAS 3-4 TIMES DAILY (1 GRAM = 1 DIME SIZE)  3  . lovastatin (MEVACOR) 10 MG tablet Take 10 mg by mouth at bedtime.    . metoprolol  succinate (TOPROL-XL) 25 MG 24 hr tablet 25 mg.     . PARoxetine (PAXIL) 40 MG tablet Take 1 tablet (40 mg total) by mouth at bedtime. 30 tablet 1  . primidone (MYSOLINE) 250 MG tablet Take 1 tablet (250 mg total) by mouth 2 (two) times daily. 60 tablet 12  . QUEtiapine (SEROQUEL) 50 MG tablet Take 1 tablet (50 mg total) by mouth at bedtime. 30 tablet 1  . valACYclovir (VALTREX) 1000 MG tablet Take 1,000 mg by mouth daily.    Minus Liberty Tosylate (INGREZZA) 80 MG CAPS Take 80 mg by mouth daily. 30 capsule 1   No current facility-administered medications for this visit.     Musculoskeletal: Strength & Muscle Tone: unable to assess due to telemed visit Gait & Station: unable to assess due to telemed visit Patient leans: unable to assess due to telemed visit  Psychiatric Specialty Exam: ROS  There were no vitals taken for this visit.There is no height or weight on file to calculate BMI.  General Appearance: Well Groomed  Eye Contact:  Good  Speech:  Clear and Coherent and Normal Rate  Volume:  Normal  Mood:  Depressed  Affect:  Depressed  Thought Process:  Goal Directed, Linear and Descriptions of Associations: Intact  Orientation:  Full (Time, Place, and Person)  Thought Content:  Logical  Suicidal Thoughts:  No  Homicidal Thoughts:  No  Memory:  Recent;   Good Remote;   Good  Judgement:  Fair  Insight:  Fair  Psychomotor Activity:  Normal  Concentration:  Concentration: Good and Attention Span: Good  Recall:  Good  Fund of Knowledge:Good  Language: Good  Akathisia:  Negative  Handed:  Right  AIMS (if indicated):  Gross resting and intentional tremors of bilateral extremities noted.  Assets:  Communication Skills Desire for Improvement Financial Resources/Insurance Housing Social Support  ADL's:  Intact  Cognition: Impaired,  Mild  Sleep:  Poor   Screenings: Mini-Mental     Office Visit from 09/12/2018 in [facility] Office Visit from 03/12/2018 in  [facility] Office Visit from 09/26/2017 in [facility] Office Visit from 06/14/2017 in [facility] Office Visit from 12/14/2016 in [facility]  Total Score (max 30 points )  24  28  28  27  28       Assessment and Plan: 68 y/o male with hx of MDD and Tardive Dyskinesia and mild cognitive impairment in addition to s/p CVA (2011), HTN, HLD now seen for psychiatric evaluation. Pt reported being in distress due to ongoing bilateral upper extremity tremors and poor sleep. He and his daughter Henrietta Dine) were agreeable to increasing his dose of Ingrezza to 80 mg for optimal effect. He was also agreeable to trying Seroquel for sleep.  1. Moderate episode of recurrent major depressive disorder (HCC)  - Start QUEtiapine (SEROQUEL) 50 MG tablet; Take 1 tablet (50 mg total) by mouth at bedtime.  Dispense: 30 tablet; Refill: 1. Seroquel was chosen due to its impact on improving sleep and appetite as well as its lower propensity to worsen tremors or cause EPS. - Continue PARoxetine (PAXIL) 40 MG tablet; Take 1 tablet (40 mg total) by mouth at bedtime.  Dispense: 30 tablet; Refill: 1 -Discontinue Mirtazapine due to lack of efficacy and risk of serotonin syndrome with paxil.  2. Tardive dyskinesia - Increase Valbenazine Tosylate (INGREZZA) 80 MG CAPS; Take 80 mg by mouth daily.  Dispense: 30 capsule; Refill: 1  F/up in 6 weeks.  Nev[place] Crane, MD 11/16/20203:35 PM

## 2018-12-20 ENCOUNTER — Ambulatory Visit: Payer: Self-pay | Admitting: Adult Health

## 2018-12-20 ENCOUNTER — Encounter: Payer: Self-pay | Admitting: Adult Health

## 2018-12-20 ENCOUNTER — Ambulatory Visit (INDEPENDENT_AMBULATORY_CARE_PROVIDER_SITE_OTHER): Payer: Medicare Other | Admitting: Adult Health

## 2018-12-20 ENCOUNTER — Other Ambulatory Visit: Payer: Self-pay

## 2018-12-20 ENCOUNTER — Telehealth: Payer: Self-pay | Admitting: Adult Health

## 2018-12-20 VITALS — BP 138/88 | HR 65 | Temp 97.4°F | Ht 67.0 in | Wt 186.0 lb

## 2018-12-20 DIAGNOSIS — G2401 Drug induced subacute dyskinesia: Secondary | ICD-10-CM | POA: Diagnosis not present

## 2018-12-20 DIAGNOSIS — Z8673 Personal history of transient ischemic attack (TIA), and cerebral infarction without residual deficits: Secondary | ICD-10-CM

## 2018-12-20 DIAGNOSIS — R4189 Other symptoms and signs involving cognitive functions and awareness: Secondary | ICD-10-CM

## 2018-12-20 NOTE — Progress Notes (Signed)
Of for of  [facility]  PATIENT: [name] DOB: [birth year]   REASON FOR VISIT: Follow-up for  tremor, memory loss, history of stroke HISTORY FROM: Patient  Chief Complaint  Patient presents with  . Follow-up    Tx room, with daughter. Tremors, and short term memory. Marland Kitchen       HISTORY OF PRESENT ILLNESS  Update 09/12/2018: [name] is a 68 year old male with prior history of stroke 2011, HTN, HLD, MCI and tremors who is being seen today for 78-month follow-up unaccompanied.  He has been being followed in this office since 2015 with [name] for ongoing tardive dyskinesia and cognitive impairment.  He continues on primidone 250 mg daily with only mild tremors that do not interfere with daily activity.  He is questioning potential need of increasing dose due to residual tremors.  He endorses occasional increased anxiety for which she will take Valium for but overall anxiety and depression stable with ongoing follow-up by psychiatry.  MMSE slightly worse today than prior visit at 24/30 (prior 28/30) but subjectively feels as though his memory has been stable.  As daughter was unable to accompany patient at today's visit, she called yesterday to inform us that his current psychiatrist recommended patient to be placed in a retirement facility due to mental state.  Patient did attempt to call his daughter at beginning of visit but unable to reach.  He does feel as though he has recently been feeling "brain fog" but is able to function without difficulty.  Unable to determine if recent medication changes have been made through epic by psychiatry but patient denies recent changes.  Denies recurrent stroke/TIA symptoms.  Continues on clopidogrel and omega-3 without side effects for secondary stroke prevention.  No further concerns at this time.  Update 10/03/2018: [name] is being seen today per patient request due to worsening of tremors.  Patient initially presented alone he  states he increase primidone dose to 250 mg twice daily which he took for 1.5 weeks but discontinued 2 days ago as he did not have any improvement.  He also endorses not currently being followed by psychiatry and is in the process of looking for a new psychiatrist.  He denies any changes in mood or increased depression.  He does endorse ongoing compliance with psychiatric medications.  Daughter then presented to visit who was unaware of patient discontinuing primidone and after further investigating, he was actually only taking primidone for 4 days prior to discontinuing.  Daughter does endorse patient stating worsening depression with decreased appetite, decreased energy and history of worsening depression with season changes.  He does have follow-up scheduled with his current psychiatrist on 10/15/2018 but is currently in the process of finding a new psychiatrist as he will not be continuing to accept Medicare insurance.  Daughter is currently in the process of finding a retirement/independent/assisted living facility for patient as recommended by his psychiatrist due to slowly worsening of memory/cognition.  He currently lives alone.  Daughter is in the process of changing pharmacies to obtain pill packs to assist patient with medication compliance.   Update 12/20/2018: [name] is a 68 year old male who is being seen today for management regarding ongoing tremors secondary to tardiative dyskinsesia as well as ongoing cognitive impairment.  There are prior visits, discussion regarding use of primidone occasionally and was not taking daily.  Prior visit, primidone increased to twice daily usage of 250 mg per dose.  He recently had visit with psychiatrist who  recommended increasing Ingrezza and initiating Seroquel to help with ongoing tremors secondary to tardive dyskinesia.  He was also initiated on Seroquel with continuing on Paxil and discontinuing mirtazapine due to ongoing depression management.  Per  daughter, recent medication changes by psychiatry which is initiated on Sunday as he receives medications through pill packs.  Daughter has noticed slight improvement since initiating medications.  He continues to have memory and cognitive difficulties and daughter believes slowly declining.  He continues to drive despite prior recommendations and daughter concerned regarding ongoing safety regarding continue driving and living independently. He continues to have difficulty with delayed/difficulty processing information, not remembering certain dates such as doctor appointments and unable to remember recent conversations.  Daughter brings in multiple old medication bottles showing patient not adequately taking medications appropriately in the past likely due to memory/cognitive impairment hence the reason for pill packs at this time.  He did undergo neuropsych evaluation in 2018, felt as though variable effort to engage in cognitive testing virtual abilities thought to be at least level reported and deficits cannot be assumed to be genuine.  Results were globally below expectation based on estimated premorbid abilities and resulting consistent with prior cognitive screening during office visit with [name] with MMSE 28/30 suggesting that he is not experiencing gross or severe neurocognitive dysfunction and potentially psychiatric disorder potentially playing a role cognitive complaints.      REVIEW OF SYSTEMS: Full 14 system review of systems performed and notable only for those listed, all others are neg: Memory loss, tremors, depression and all other systems negative      ALLERGIES: Allergies  Allergen Reactions  . Acyclovir And Related   . Aspirin Other (See Comments)    Dizziness, nausea and vomiting Balance issues  . Lisinopril Other (See Comments)    Critical Cough  . Lithium Other (See Comments)    Critical Per pt reaction unknown  . Oxycodone Nausea And Vomiting  .  Oxycodone-Acetaminophen Nausea And Vomiting    Also causes dizziness    HOME MEDICATIONS: Outpatient Medications Prior to Visit  Medication Sig Dispense Refill  . amLODipine (NORVASC) 10 MG tablet Take 10 mg by mouth daily.    . candesartan (ATACAND) 16 MG tablet Take 32 mg by mouth daily.     . clopidogrel (PLAVIX) 75 MG tablet Take 1 tablet (75 mg total) by mouth daily. 30 tablet 11  . Fluocinonide 0.1 % CREA APPLY 2-3 GRAMS TO AFFECTED AREAS 3-4 TIMES DAILY (1 GRAM = 1 DIME SIZE)  3  . lovastatin (MEVACOR) 10 MG tablet Take 10 mg by mouth at bedtime.    . metoprolol succinate (TOPROL-XL) 25 MG 24 hr tablet 25 mg.     . PARoxetine (PAXIL) 40 MG tablet Take 1 tablet (40 mg total) by mouth at bedtime. 30 tablet 1  . primidone (MYSOLINE) 250 MG tablet Take 1 tablet (250 mg total) by mouth 2 (two) times daily. 60 tablet 12  . QUEtiapine (SEROQUEL) 50 MG tablet Take 1 tablet (50 mg total) by mouth at bedtime. 30 tablet 1  . valACYclovir (VALTREX) 1000 MG tablet Take 1,000 mg by mouth daily.    Minus Liberty Tosylate (INGREZZA) 80 MG CAPS Take 80 mg by mouth daily. 30 capsule 1   No facility-administered medications prior to visit.     PAST MEDICAL HISTORY: Past Medical History:  Diagnosis Date  . Depression   . Frequent urination   . GERD (gastroesophageal reflux disease)   . Herpes   .  Hyperlipidemia   . Hypertension   . Memory loss   . Obesity   . Stroke ([place])   . Urgency of urination     PAST SURGICAL HISTORY: Past Surgical History:  Procedure Laterality Date  . disckec    . SPINAL FUSION    . VASECTOMY      FAMILY HISTORY: Family History  Problem Relation Age of Onset  . Hypertension Mother   . Hypertension Father   . Hypertension Sister   . Hypertension Brother   . Cancer Maternal Aunt   . Cancer - Ovarian Maternal Grandmother   . Diabetes Mellitus I Paternal Grandmother   . Hypertension Paternal Grandfather   . Cancer Maternal Aunt     SOCIAL HISTORY:  Social History   Socioeconomic History  . Marital status: Legally Separated    Spouse name: Not on file  . Number of children: 1  . Years of education: Masters  . Highest education level: Not on file  Occupational History  . Occupation: retired    Fish farm manager: RETIRED  Social Needs  . Financial resource strain: Not on file  . Food insecurity    Worry: Not on file    Inability: Not on file  . Transportation needs    Medical: Not on file    Non-medical: Not on file  Tobacco Use  . Smoking status: Former Research scientist (life sciences)  . Smokeless tobacco: Never Used  Substance and Sexual Activity  . Alcohol use: No    Alcohol/week: 1.0 standard drinks    Types: 1 Cans of beer per week    Comment: socially  . Drug use: No  . Sexual activity: Never  Lifestyle  . Physical activity    Days per week: Not on file    Minutes per session: Not on file  . Stress: Not on file  Relationships  . Social Herbalist on phone: Not on file    Gets together: Not on file    Attends religious service: Not on file    Active member of club or organization: Not on file    Attends meetings of clubs or organizations: Not on file    Relationship status: Not on file  . Intimate partner violence    Fear of current or ex partner: Not on file    Emotionally abused: Not on file    Physically abused: Not on file    Forced sexual activity: Not on file  Other Topics Concern  . Not on file  Social History Narrative   Patient is separated.   Patient lives alone and has one child.   Patient is retired.   Patient is right-handed.   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   Patient does not drink any caffeine.     PHYSICAL EXAM  Vitals:   12/20/18 0821  BP: 138/88  Pulse: 65  Temp: (!) 97.4 F (36.3 C)  Weight: 186 lb (84.4 kg)  Height: 5\' 7"  (1.702 m)   Body mass index is 29.13 kg/m. Generalized: Well developed middle aged african [place] male, in no acute distress   Head: normocephalic and atraumatic,    Neck: Supple, no carotid bruits  Cardiac: Regular rate rhythm, no murmur  Musculoskeletal: No deformity   Neurological examination  Mentation: Alert oriented to time, place, history taking. Follows all commands speech and language fluent.  Knowledge of information diminished.  Short-term memory diminished as well as evidence of difficulty processing information. Cranial nerve II-XII: Pupils were equal round reactive  to light extraocular movements were full, visual field were full on confrontational test. Facial sensation and strength were normal. hearing was intact to finger rubbing bilaterally. Uvula tongue midline. head turning and shoulder shrug and were normal and symmetric.Tongue protrusion into cheek strength was normal.  Motor: normal bulk and tone, full strength in the BUE, BLE, fine finger movements normal, no pronator drift. No focal weakness.  Moderate action tremors noted in bilateral upper extremities and mild tremoring at rest.  No tremors noted in bilateral lower extremities Sensory: normal and symmetric to light touch, pinprick, and vibration in the upper and lower extremities Gait and Station: Rising up from seated position without assistance, normal stance, without trunk ataxia, moderate stride, without evidence of balance difficulties Reflexes: equal and symmetric    ASSESSMENT AND PLAN  77 year African American male with history of tardive dyskinesia due to prior medication side effect as well as mild cognitive impairment.  History of right brain subcortical infarct due to small vessel disease in December 2011 with no significant residual deficits. Vascular risk factors of hypertension and hyperlipidemia. Mild memory difficulties likely due to underlying   depression versus mild cognitive impairment from remote stroke and multiple psychoactive medications.  Prior difficulty with medication compliance (taking less than prescribed) at multiple follow-up visits.  Due to ongoing  tremors despite increasing primidone to 250 mg twice daily, recommended initiating propranolol 60 mg daily.  He was recently seen by psychiatry who increased Ingrezza and initiated Seroquel due to ongoing tardive dyskinesia and ongoing follow-up for depression with continuation of Paxil.  New regimen initiated 4 days ago.  Daughter also reports worsening cognition and memory with safety concerns regarding patient continuing to drive and living independently    PLAN:  -Long discussion regarding ongoing tardive dyskinesia which has been found to worsen in the fall.  Recent adjustments made by new psychiatrist therefore no changes recommended at this time.  Request ongoing management by psychiatry with explanation of not wanting "too many hands in the pot" which daughter was agreeable with.  May consider need to movement disorder specialist in the future if indicated -Due to ongoing cognitive impairment and prior neuropsych evaluation difficulty fully assessing due to lack of engagement, referral placed for repeat neurocognitive evaluation for further recommendations regarding continued living arrangements and ongoing driving along with possible influence by ongoing psychiatric disorders.  Prior extensive work-up regarding memory performed at prior visits largely unremarkable.  Due to time constraints during today's visit, unable to perform MMSE -Continue clopidogrel and omega-3 for secondary stroke prevention   Follow-up appointment scheduled in 02/2019 and advised to keep current appointment for follow-up visit   Greater than 50% time during this prolonged 45 minute visit was spent on counseling and coordination of care about his ongoing/worsening cognitive difficulties, ongoing tremors secondary to tardive dyskinesia, discussion regarding prior stroke and answering questions to patient and daughter satisfaction   Frann Rider, AGNP-BC  [facility] [address] [place]  [place], [place] [address]  Phone [phone] Fax [phone] Note: This document was prepared with digital dictation and possible smart phrase technology. Any transcriptional errors that result from this process are unintentional.

## 2018-12-20 NOTE — Telephone Encounter (Signed)
Please advise patient to speak to his PCP or psychiatrist in regards to concerns of swelling behind his knees as this can be potentially caused from a variety mechanisms along with potential medication use

## 2018-12-20 NOTE — Telephone Encounter (Signed)
Pt called wanting to inform provider that he sweats profusely behind his legs and is wanting to know if this has to do with his tremors. Please advise.

## 2018-12-20 NOTE — Progress Notes (Signed)
I agree with the above plan 

## 2018-12-20 NOTE — Progress Notes (Deleted)
Of for of  [facility]  PATIENT: [name] DOB: 11/17/1950   REASON FOR VISIT: Follow-up for  tremor, memory loss, history of stroke HISTORY FROM: Patient  No chief complaint on file.     HISTORY OF PRESENT ILLNESS  Update 09/12/2018: [name] is a 68 year old male with prior history of stroke 2011, HTN, HLD, MCI and tremors who is being seen today for 25-month follow-up unaccompanied.  He has been being followed in this office since 2015.  He continues on primidone 250 mg daily with only mild tremors that do not interfere with daily activity.  He is questioning potential need of increasing dose due to residual tremors.  He endorses occasional increased anxiety for which she will take Valium for but overall anxiety and depression stable with ongoing follow-up by psychiatry.  MMSE slightly worse today than prior visit at 24/30 (prior 28/30) but subjectively feels as though his memory has been stable.  As daughter was unable to accompany patient at today's visit, she called yesterday to inform us that his current psychiatrist recommended patient to be placed in a retirement facility due to mental state.  Patient did attempt to call his daughter at beginning of visit but unable to reach.  He does feel as though he has recently been feeling "brain fog" but is able to function without difficulty.  Unable to determine if recent medication changes have been made through epic by psychiatry but patient denies recent changes.  Denies recurrent stroke/TIA symptoms.  Continues on clopidogrel and omega-3 without side effects for secondary stroke prevention.  No further concerns at this time.  Update 10/03/2018: [name] is being seen today per patient request due to worsening of tremors.  Patient initially presented alone he states he increase primidone dose to 250 mg twice daily which he took for 1.5 weeks but discontinued 2 days ago as he did not have any improvement.  He also  endorses not currently being followed by psychiatry and is in the process of looking for a new psychiatrist.  He denies any changes in mood or increased depression.  He does endorse ongoing compliance with psychiatric medications.  Daughter then presented to visit who was unaware of patient discontinuing primidone and after further investigating, he was actually only taking primidone for 4 days prior to discontinuing.  Daughter does endorse patient stating worsening depression with decreased appetite, decreased energy and history of worsening depression with season changes.  He does have follow-up scheduled with his current psychiatrist on 10/15/2018 but is currently in the process of finding a new psychiatrist as he will not be continuing to accept Medicare insurance.  Daughter is currently in the process of finding a retirement/independent/assisted living facility for patient as recommended by his psychiatrist due to slowly worsening of memory/cognition.  He currently lives alone.  Daughter is in the process of changing pharmacies to obtain pill packs to assist patient with medication compliance.   Update 12/20/2018: [name] is a 68 year old male who is being seen today for management regarding ongoing tremors.  He recently had visit with psychiatrist who recommended increasing Ingrezza and initiating Seroquel to help with ongoing tremors secondary to tardive dyskinesia.  He was also initiated on Seroquel with continuing on Paxil and discontinuing mirtazapine due to ongoing depression management.  Tremors have been ***.       REVIEW OF SYSTEMS: Full 14 system review of systems performed and notable only for those listed, all others are neg: Memory loss, tremors, depression  and all other systems negative      ALLERGIES: Allergies  Allergen Reactions   Acyclovir And Related    Aspirin Other (See Comments)    Dizziness, nausea and vomiting Balance issues   Lisinopril Other (See Comments)     Critical Cough   Lithium Other (See Comments)    Critical Per pt reaction unknown   Oxycodone Nausea And Vomiting   Oxycodone-Acetaminophen Nausea And Vomiting    Also causes dizziness    HOME MEDICATIONS: Outpatient Medications Prior to Visit  Medication Sig Dispense Refill   amLODipine (NORVASC) 10 MG tablet Take 10 mg by mouth daily.     candesartan (ATACAND) 16 MG tablet Take 32 mg by mouth daily.      clopidogrel (PLAVIX) 75 MG tablet Take 1 tablet (75 mg total) by mouth daily. 30 tablet 11   Fluocinonide 0.1 % CREA APPLY 2-3 GRAMS TO AFFECTED AREAS 3-4 TIMES DAILY (1 GRAM = 1 DIME SIZE)  3   lovastatin (MEVACOR) 10 MG tablet Take 10 mg by mouth at bedtime.     metoprolol succinate (TOPROL-XL) 25 MG 24 hr tablet 25 mg.      PARoxetine (PAXIL) 40 MG tablet Take 1 tablet (40 mg total) by mouth at bedtime. 30 tablet 1   primidone (MYSOLINE) 250 MG tablet Take 1 tablet (250 mg total) by mouth 2 (two) times daily. 60 tablet 12   QUEtiapine (SEROQUEL) 50 MG tablet Take 1 tablet (50 mg total) by mouth at bedtime. 30 tablet 1   valACYclovir (VALTREX) 1000 MG tablet Take 1,000 mg by mouth daily.     Valbenazine Tosylate (INGREZZA) 80 MG CAPS Take 80 mg by mouth daily. 30 capsule 1   No facility-administered medications prior to visit.     PAST MEDICAL HISTORY: Past Medical History:  Diagnosis Date   Depression    Frequent urination    GERD (gastroesophageal reflux disease)    Herpes    Hyperlipidemia    Hypertension    Memory loss    Obesity    Stroke ([place])    Urgency of urination     PAST SURGICAL HISTORY: Past Surgical History:  Procedure Laterality Date   disckec     SPINAL FUSION     VASECTOMY      FAMILY HISTORY: Family History  Problem Relation Age of Onset   Hypertension Mother    Hypertension Father    Hypertension Sister    Hypertension Brother    Cancer Maternal Aunt    Cancer - Ovarian Maternal Grandmother     Diabetes Mellitus I Paternal Grandmother    Hypertension Paternal Grandfather    Cancer Maternal Aunt     SOCIAL HISTORY: Social History   Socioeconomic History   Marital status: Legally Separated    Spouse name: Not on file   Number of children: 1   Years of education: Masters   Highest education level: Not on file  Occupational History   Occupation: retired    Fish farm manager: RETIRED  Scientist, product/process development strain: Not on file   Food insecurity    Worry: Not on file    Inability: Not on file   Transportation needs    Medical: Not on file    Non-medical: Not on file  Tobacco Use   Smoking status: Former Smoker   Smokeless tobacco: Never Used  Substance and Sexual Activity   Alcohol use: No    Alcohol/week: 1.0 standard drinks    Types: 1  Cans of beer per week    Comment: socially   Drug use: No   Sexual activity: Never  Lifestyle   Physical activity    Days per week: Not on file    Minutes per session: Not on file   Stress: Not on file  Relationships   Social connections    Talks on phone: Not on file    Gets together: Not on file    Attends religious service: Not on file    Active member of club or organization: Not on file    Attends meetings of clubs or organizations: Not on file    Relationship status: Not on file   Intimate partner violence    Fear of current or ex partner: Not on file    Emotionally abused: Not on file    Physically abused: Not on file    Forced sexual activity: Not on file  Other Topics Concern   Not on file  Social History Narrative   Patient is separated.   Patient lives alone and has one child.   Patient is retired.   Patient is right-handed.   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   Patient does not drink any caffeine.     PHYSICAL EXAM  There were no vitals filed for this visit. There is no height or weight on file to calculate BMI. Generalized: Well developed middle aged african [place] male,  in no acute distress   Head: normocephalic and atraumatic,   Neck: Supple, no carotid bruits  Cardiac: Regular rate rhythm, no murmur  Musculoskeletal: No deformity   Neurological examination  Mentation: Alert oriented to time, place, history taking. Follows all commands speech and language fluent.  Knowledge of information diminished.  Short-term memory diminished. Cranial nerve II-XII: Pupils were equal round reactive to light extraocular movements were full, visual field were full on confrontational test. Facial sensation and strength were normal. hearing was intact to finger rubbing bilaterally. Uvula tongue midline. head turning and shoulder shrug and were normal and symmetric.Tongue protrusion into cheek strength was normal.  Motor: normal bulk and tone, full strength in the BUE, BLE, fine finger movements normal, no pronator drift. No focal weakness.  Moderate action tremors noted in bilateral upper extremities and bilateral lower extremities with mild tremoring at rest Sensory: normal and symmetric to light touch, pinprick, and vibration in the upper and lower extremities Gait and Station: Rising up from seated position without assistance, normal stance, without trunk ataxia, moderate stride, without evidence of balance difficulties Reflexes: equal and symmetric   DIAGNOSTIC DATA (LABS, IMAGING, TESTING) -ASSESSMENT AND PLAN  12 year African American male with upper extremity action tremor with recent onset of worsening likely due to increased depression/stress and discontinuation of primidone.  History of right brain subcortical infarct due to small vessel disease in December 2011 with no significant residual deficits. Vascular risk factors of hypertension and hyperlipidemia. Mild memory difficulties likely due to underlying   depression versus mild cognitive impairment from remote stroke and multiple psychoactive medications.  Discussion regarding potential worsening of tremors likely due  to worsening depression and increased stress with the family discussing moving patient to a retirement community versus independent/assisted living facility due to worsening memory as well as discontinuing primidone   PLAN:  -Advised to restart primidone 250 mg twice daily -He does have scheduled follow-up with psychiatry at the end of this month -Discussion regarding importance of continuation of medications and did not discontinue or take extra dose without discussion  with provider.  If worsening tremors continue, may consider addition of propranolol or evaluation by movement specialist as he does not believe his symptoms are tremors but potentially related to something else -Continue clopidogrel and omega-3 for secondary stroke prevention -He will continue to follow with psychiatry for ongoing anxiety and depression management  Follow-up as scheduled   Greater than 50% time during this 25 minute visit was spent on counseling and coordination of care about his cognitive difficulties, tremors, stroke and answering questions along with discussion regarding importance of medication compliance and following up with psychiatry for worsening depression.     Frann Rider, AGNP-BC  [facility] [address] [place], [place] [address]  Phone [phone] Fax [phone] Note: This document was prepared with digital dictation and possible smart phrase technology. Any transcriptional errors that result from this process are unintentional.

## 2018-12-24 NOTE — Telephone Encounter (Signed)
(  Please see previous note from Frann Rider NP)   Called and spoke with pt and his daughter. Relayed the message from NP.  Both demonstrated understanding.

## 2018-12-26 ENCOUNTER — Encounter: Payer: Self-pay | Admitting: Psychology

## 2019-01-01 ENCOUNTER — Telehealth: Payer: Self-pay

## 2019-01-01 NOTE — Telephone Encounter (Signed)
I called the number but the call went straight to his voice mail.

## 2019-01-01 NOTE — Telephone Encounter (Signed)
received a message from [name] , [employer] employee that pt he needs a cb abt his meds... said can't sleep and shaking and sweating on his legs which is new

## 2019-01-01 NOTE — Telephone Encounter (Signed)
left message for patient to call office back to get more details of what is going on.

## 2019-01-15 ENCOUNTER — Ambulatory Visit (INDEPENDENT_AMBULATORY_CARE_PROVIDER_SITE_OTHER): Payer: Medicare Other | Admitting: Psychiatry

## 2019-01-15 ENCOUNTER — Encounter: Payer: Self-pay | Admitting: Psychiatry

## 2019-01-15 ENCOUNTER — Other Ambulatory Visit: Payer: Self-pay

## 2019-01-15 DIAGNOSIS — G2401 Drug induced subacute dyskinesia: Secondary | ICD-10-CM | POA: Diagnosis not present

## 2019-01-15 DIAGNOSIS — F3341 Major depressive disorder, recurrent, in partial remission: Secondary | ICD-10-CM | POA: Diagnosis not present

## 2019-01-15 DIAGNOSIS — F331 Major depressive disorder, recurrent, moderate: Secondary | ICD-10-CM

## 2019-01-15 MED ORDER — QUETIAPINE FUMARATE 50 MG PO TABS: 50.0000 mg | ORAL_TABLET | Freq: Every day | ORAL | 1 refills | Status: DC

## 2019-01-15 MED ORDER — INGREZZA 80 MG PO CAPS: 80.0000 mg | ORAL_CAPSULE | Freq: Every day | ORAL | 1 refills | Status: DC

## 2019-01-15 MED ORDER — PAROXETINE HCL 40 MG PO TABS: 40.0000 mg | ORAL_TABLET | Freq: Every day | ORAL | 1 refills | Status: DC

## 2019-01-15 NOTE — Progress Notes (Signed)
[facility] MD OP Progress Note  I connected with  [name] on 01/15/19 by a video enabled telemedicine application and verified that I am speaking with the correct person using two identifiers.   I discussed the limitations of evaluation and management by telemedicine. The patient expressed understanding and agreed to proceed.    01/15/2019 1:37 PM [name]  MRN:  [id number]  Chief Complaint: " My tremors- can you stop them."  HPI: Patient reported that he has been doing fine overall however he still distressed due to his tremors.  He asked if his medication Ingrezza could be increased so that his tremors can stop.  He informed his tremors are still distressing and he does not know what to do.  He was seen by neurology a few weeks ago.  As per the note their recommendations continue primidone to 50 mg twice daily.  His dose of Ingrezza was increased to 80 mg a few weeks ago and patient has not noticed much of a difference in his tremors. Regarding his mood, patient stated that his mood has been fine except for a few times due to him getting in to argument with his daughter (also his POA).  He stated that his daughter is insisting that he agrees to go to the assisted living facility however he does not think he needs to do that at this point.  He stated that his daughter is fixated on this and seems to be overwhelmed due to him.  He stated that she keeps saying that he is not capable of taking care of himself and has significant memory issues.  He stated that 2 days ago his daughter told him that she is never contacted him again and is not accompanying him for any of his appointments.  He informed that she has done this in the past and did not speak to him for a time span of 5 years when she was living in [place].  He kept saying that his daughter gets her stubborn nature from her mother. He informed that he has been able to sleep better with Seroquel and generally gets about 7 hours of sleep every  night.  He has also been exercising regularly.  By exercise he implied taking walks around the block or in the house when the weather is cold.  He stated that he is able to take care of his routine chores and does not believe he needs to go to the residential facility. He did mention that last night was rough for him as his daughter refused to join him for his appointment today.  He is feeling better now and is planning to go out for a walk after finishing with the appointment. He denied any suicidal ideations or plans. Patient was encouraged to call his daughter when he feels up to it.  Visit Diagnosis:    ICD-10-CM   1. MDD (major depressive disorder), recurrent, in partial remission ([place])  F33.41   2. Tardive dyskinesia  G24.01     Past Psychiatric History: depression, TD  Past Medical History:  Past Medical History:  Diagnosis Date  . Depression   . Frequent urination   . GERD (gastroesophageal reflux disease)   . Herpes   . Hyperlipidemia   . Hypertension   . Memory loss   . Obesity   . Stroke ([place])   . Urgency of urination     Past Surgical History:  Procedure Laterality Date  . disckec    . SPINAL  FUSION    . VASECTOMY        Family History:  Family History  Problem Relation Age of Onset  . Hypertension Mother   . Hypertension Father   . Hypertension Sister   . Hypertension Brother   . Cancer Maternal Aunt   . Cancer - Ovarian Maternal Grandmother   . Diabetes Mellitus I Paternal Grandmother   . Hypertension Paternal Grandfather   . Cancer Maternal Aunt     Social History:  Social History   Socioeconomic History  . Marital status: Legally Separated    Spouse name: Not on file  . Number of children: 1  . Years of education: Masters  . Highest education level: Not on file  Occupational History  . Occupation: retired    Fish farm manager: RETIRED  Tobacco Use  . Smoking status: Former Research scientist (life sciences)  . Smokeless tobacco: Never Used  Substance and Sexual Activity   . Alcohol use: No    Alcohol/week: 1.0 standard drinks    Types: 1 Cans of beer per week    Comment: socially  . Drug use: No  . Sexual activity: Never  Other Topics Concern  . Not on file  Social History Narrative   Patient is separated.   Patient lives alone and has one child.   Patient is retired.   Patient is right-handed.   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   Patient does not drink any caffeine.   Social Determinants of Health   Financial Resource Strain:   . Difficulty of Paying Living Expenses: Not on file  Food Insecurity:   . Worried About Charity fundraiser in the Last Year: Not on file  . Ran Out of Food in the Last Year: Not on file  Transportation Needs:   . Lack of Transportation (Medical): Not on file  . Lack of Transportation (Non-Medical): Not on file  Physical Activity:   . Days of Exercise per Week: Not on file  . Minutes of Exercise per Session: Not on file  Stress:   . Feeling of Stress : Not on file  Social Connections:   . Frequency of Communication with Friends and Family: Not on file  . Frequency of Social Gatherings with Friends and Family: Not on file  . Attends Religious Services: Not on file  . Active Member of Clubs or Organizations: Not on file  . Attends Archivist Meetings: Not on file  . Marital Status: Not on file    Allergies:  Allergies  Allergen Reactions  . Acyclovir And Related   . Aspirin Other (See Comments)    Dizziness, nausea and vomiting Balance issues  . Lisinopril Other (See Comments)    Critical Cough  . Lithium Other (See Comments)    Critical Per pt reaction unknown  . Oxycodone Nausea And Vomiting  . Oxycodone-Acetaminophen Nausea And Vomiting    Also causes dizziness    Metabolic Disorder Labs: Lab Results  Component Value Date   HGBA1C 5.5 05/29/2014   MPG 120 (H) 12/20/2009   No results found for: PROLACTIN Lab Results  Component Value Date   CHOL 150 05/29/2014   TRIG 284 (H)  05/29/2014   HDL 30 (L) 05/29/2014   CHOLHDL 5.0 05/29/2014   VLDL 49 (H) 12/20/2009   LDLCALC 63 05/29/2014   LDLCALC (H) 12/20/2009    149        Total Cholesterol/HDL:CHD Risk Coronary Heart Disease Risk Table  Men   Women  1/2 Average Risk   3.4   3.3  Average Risk       5.0   4.4  2 X Average Risk   9.6   7.1  3 X Average Risk  23.4   11.0        Use the calculated Patient Ratio above and the CHD Risk Table to determine the patient's CHD Risk.        ATP III CLASSIFICATION (LDL):  <100     mg/dL   Optimal  100-129  mg/dL   Near or Above                    Optimal  130-159  mg/dL   Borderline  160-189  mg/dL   High  >190     mg/dL   Very High   Lab Results  Component Value Date   TSH 2.850 10/16/2013   TSH 2.460 10/31/2010    Therapeutic Level Labs: No results found for: LITHIUM No results found for: VALPROATE No components found for:  CBMZ  Current Medications: Current Outpatient Medications  Medication Sig Dispense Refill  . amLODipine (NORVASC) 10 MG tablet Take 10 mg by mouth daily.    . candesartan (ATACAND) 16 MG tablet Take 32 mg by mouth daily.     . clopidogrel (PLAVIX) 75 MG tablet Take 1 tablet (75 mg total) by mouth daily. 30 tablet 11  . Fluocinonide 0.1 % CREA APPLY 2-3 GRAMS TO AFFECTED AREAS 3-4 TIMES DAILY (1 GRAM = 1 DIME SIZE)  3  . lovastatin (MEVACOR) 10 MG tablet Take 10 mg by mouth at bedtime.    . metoprolol succinate (TOPROL-XL) 25 MG 24 hr tablet 25 mg.     . PARoxetine (PAXIL) 40 MG tablet Take 1 tablet (40 mg total) by mouth at bedtime. 30 tablet 1  . primidone (MYSOLINE) 250 MG tablet Take 1 tablet (250 mg total) by mouth 2 (two) times daily. 60 tablet 12  . QUEtiapine (SEROQUEL) 50 MG tablet Take 1 tablet (50 mg total) by mouth at bedtime. 30 tablet 1  . valACYclovir (VALTREX) 1000 MG tablet Take 1,000 mg by mouth daily.    [place] Tosylate (INGREZZA) 80 MG CAPS Take 80 mg by mouth daily. 30 capsule 1   No  current facility-administered medications for this visit.     Musculoskeletal: Strength & Muscle Tone: unable to assess due to telemed visit Gait & Station: unable to assess due to telemed visit Patient leans: unable to assess due to telemed visit  Psychiatric Specialty Exam: Review of Systems  There were no vitals taken for this visit.There is no height or weight on file to calculate BMI.  General Appearance: Fairly Groomed  Eye Contact:  Good  Speech:  Clear and Coherent and Normal Rate  Volume:  Normal  Mood:  Euthymic  Affect:  Restricted  Thought Process:  Goal Directed and Descriptions of Associations: Intact  Orientation:  Full (Time, Place, and Person)  Thought Content: Logical   Suicidal Thoughts:  No  Homicidal Thoughts:  No  Memory:  Recent;   Good  Judgement:  Fair  Insight:  Fair  Psychomotor Activity:  Coarse bilateral upper extremity tremors noted  Concentration:  Concentration: Good and Attention Span: Good  Recall:  Good  Fund of Knowledge: Good  Language: Good  Akathisia:  Negative  Handed:  Right  AIMS (if indicated): not done  Assets:  Communication Skills Desire for Improvement  Financial Resources/Insurance Housing  ADL's:  Intact  Cognition: Impaired,  Mild  Sleep:  Good   Screenings: Mini-Mental     Office Visit from 09/12/2018 in [facility] Office Visit from 03/12/2018 in [facility] Office Visit from 09/26/2017 in [facility] Office Visit from 06/14/2017 in [facility] Office Visit from 12/14/2016 in [facility]  Total Score (max 30 points )  24  28  28  27  28        Assessment and Plan: Patient was noted to be less depressed today.  He is still in mild distress due to his ongoing bilateral tremors of upper extremities.  He is on Ingrezza 80 mg for tardive dyskinesia and is also on primidone to 250 mg twice daily prescribed by neurology.  Due to  improvement in mood and sleep I recommend that he continues combination of Paxil 40 mg and Seroquel 50 mg daily.  We will continue Ingrezza 80 mg.  1. MDD (major depressive disorder), recurrent, in partial remission ([facility])  - Continue PARoxetine (PAXIL) 40 MG tablet; Take 1 tablet (40 mg total) by mouth at bedtime.  Dispense: 30 tablet; Refill: 1 -Continue QUEtiapine (SEROQUEL) 50 MG tablet; Take 1 tablet (50 mg total) by mouth at bedtime.  Dispense: 30 tablet; Refill: 1  2. Tardive dyskinesia  -Continue valbenazine Tosylate (INGREZZA) 80 MG CAPS; Take 80 mg by mouth daily.  Dispense: 30 capsule; Refill: 1  Continue follow-up with neurology. Follow-up in 6 weeks.  N[place] Crane, MD 01/15/2019, 1:37 PM

## 2019-01-22 ENCOUNTER — Telehealth: Payer: Self-pay

## 2019-01-22 DIAGNOSIS — G2401 Drug induced subacute dyskinesia: Secondary | ICD-10-CM

## 2019-01-22 NOTE — Telephone Encounter (Signed)
spoke with pt because pt left a message on tamara voicemail. pt stated that he as TD and he wanted to try Austedo. Pt was told that dr. Toy Care was out of the office until Wednesday and pt was fine with that.

## 2019-01-23 MED ORDER — AUSTEDO 12 MG PO TABS: 12.0000 mg | ORAL_TABLET | Freq: Two times a day (BID) | ORAL | 1 refills | Status: DC

## 2019-01-23 NOTE — Telephone Encounter (Signed)
I called and spoke with the patient.  Patient stated that he read about Austedo for tardive dyskinesia on Internet and wants to give it a try.  He has not noticed any improvement on higher dose of Ingrezza.  He denies any side effects. I discussed trial of Austedo with him.  Patient was explained that he will be started on starting dose of 12 mg twice a day.  He was informed I will send the prescription to your pharmacy and he was reminded of his follow-up appointment on February 10 with me. I asked if he made any contact with his daughter, patient replied that he has not called her yet.  He also informed that she has been coughing either. He denied any acute distress at this time and seem to be happy about trying any medication to help tardive dyskinesia. Patient was explained to discontinue Ingrezza when he receives the [facility] prescription in his mail.  And then to start a Austedo after a 24-hour washout.

## 2019-01-30 DIAGNOSIS — F332 Major depressive disorder, recurrent severe without psychotic features: Secondary | ICD-10-CM | POA: Diagnosis not present

## 2019-02-02 ENCOUNTER — Other Ambulatory Visit: Payer: Self-pay

## 2019-02-02 ENCOUNTER — Encounter (HOSPITAL_COMMUNITY): Payer: Self-pay | Admitting: Psychiatry

## 2019-02-02 ENCOUNTER — Observation Stay (HOSPITAL_COMMUNITY)
Admission: RE | Admit: 2019-02-02 | Discharge: 2019-02-03 | Disposition: A | Discharge status: Home / Self Care | Payer: Medicare Other | Attending: Psychiatry | Admitting: Psychiatry

## 2019-02-02 DIAGNOSIS — R45851 Suicidal ideations: Secondary | ICD-10-CM | POA: Diagnosis present

## 2019-02-02 DIAGNOSIS — Z8673 Personal history of transient ischemic attack (TIA), and cerebral infarction without residual deficits: Secondary | ICD-10-CM | POA: Insufficient documentation

## 2019-02-02 DIAGNOSIS — Z7902 Long term (current) use of antithrombotics/antiplatelets: Secondary | ICD-10-CM | POA: Insufficient documentation

## 2019-02-02 DIAGNOSIS — I10 Essential (primary) hypertension: Secondary | ICD-10-CM | POA: Insufficient documentation

## 2019-02-02 DIAGNOSIS — E669 Obesity, unspecified: Secondary | ICD-10-CM | POA: Insufficient documentation

## 2019-02-02 DIAGNOSIS — G629 Polyneuropathy, unspecified: Secondary | ICD-10-CM | POA: Diagnosis not present

## 2019-02-02 DIAGNOSIS — Z79899 Other long term (current) drug therapy: Secondary | ICD-10-CM | POA: Diagnosis not present

## 2019-02-02 DIAGNOSIS — K219 Gastro-esophageal reflux disease without esophagitis: Secondary | ICD-10-CM | POA: Diagnosis not present

## 2019-02-02 DIAGNOSIS — E785 Hyperlipidemia, unspecified: Secondary | ICD-10-CM | POA: Insufficient documentation

## 2019-02-02 DIAGNOSIS — Z791 Long term (current) use of non-steroidal anti-inflammatories (NSAID): Secondary | ICD-10-CM | POA: Diagnosis not present

## 2019-02-02 DIAGNOSIS — F332 Major depressive disorder, recurrent severe without psychotic features: Secondary | ICD-10-CM | POA: Diagnosis not present

## 2019-02-02 DIAGNOSIS — Z87891 Personal history of nicotine dependence: Secondary | ICD-10-CM | POA: Insufficient documentation

## 2019-02-02 DIAGNOSIS — G2401 Drug induced subacute dyskinesia: Secondary | ICD-10-CM | POA: Diagnosis not present

## 2019-02-02 DIAGNOSIS — Z20822 Contact with and (suspected) exposure to covid-19: Secondary | ICD-10-CM | POA: Diagnosis not present

## 2019-02-02 LAB — RESPIRATORY PANEL BY RT PCR (FLU A&B, COVID)
Influenza A by PCR: NEGATIVE
Influenza B by PCR: NEGATIVE
SARS Coronavirus 2 by RT PCR: NEGATIVE

## 2019-02-02 MED ORDER — PAROXETINE HCL 10 MG PO TABS
40.0000 mg | ORAL_TABLET | Freq: Every day | ORAL | Status: DC
  Administered 2019-02-02: 40 mg via ORAL
  Filled 2019-02-02: qty 4

## 2019-02-02 MED ORDER — IRBESARTAN 75 MG PO TABS
300.0000 mg | ORAL_TABLET | Freq: Every day | ORAL | Status: DC
  Administered 2019-02-03: 300 mg via ORAL
  Filled 2019-02-02: qty 4

## 2019-02-02 MED ORDER — DEUTETRABENAZINE 12 MG PO TABS: 12.0000 mg | ORAL_TABLET | Freq: Two times a day (BID) | ORAL | Status: DC

## 2019-02-02 MED ORDER — METOPROLOL SUCCINATE ER 25 MG PO TB24
25.0000 mg | ORAL_TABLET | Freq: Every day | ORAL | Status: DC
  Administered 2019-02-03: 25 mg via ORAL
  Filled 2019-02-02: qty 1

## 2019-02-02 MED ORDER — PRAVASTATIN SODIUM 10 MG PO TABS: 10.0000 mg | ORAL_TABLET | Freq: Every day | ORAL | Status: DC

## 2019-02-02 MED ORDER — ALUM & MAG HYDROXIDE-SIMETH 200-200-20 MG/5ML PO SUSP: 30.0000 mL | ORAL | Status: DC | PRN

## 2019-02-02 MED ORDER — MAGNESIUM HYDROXIDE 400 MG/5ML PO SUSP: 30.0000 mL | Freq: Every day | ORAL | Status: DC | PRN

## 2019-02-02 MED ORDER — AMLODIPINE BESYLATE 5 MG PO TABS
10.0000 mg | ORAL_TABLET | Freq: Every day | ORAL | Status: DC
  Administered 2019-02-03: 10 mg via ORAL
  Filled 2019-02-02: qty 2

## 2019-02-02 MED ORDER — PRIMIDONE 50 MG PO TABS
250.0000 mg | ORAL_TABLET | Freq: Two times a day (BID) | ORAL | Status: DC
  Administered 2019-02-02: 250 mg via ORAL
  Filled 2019-02-02 (×7): qty 5

## 2019-02-02 MED ORDER — QUETIAPINE FUMARATE 50 MG PO TABS
50.0000 mg | ORAL_TABLET | Freq: Every day | ORAL | Status: DC
  Administered 2019-02-02: 50 mg via ORAL
  Filled 2019-02-02: qty 1

## 2019-02-02 MED ORDER — CLOPIDOGREL BISULFATE 75 MG PO TABS
75.0000 mg | ORAL_TABLET | Freq: Every day | ORAL | Status: DC
  Administered 2019-02-03: 75 mg via ORAL
  Filled 2019-02-02 (×4): qty 1

## 2019-02-02 NOTE — H&P (Signed)
Behavioral Health Medical Screening Exam  [name] is an 69 y.o. male.  Presents with suicidal ideations due to recent medication adjustments. Patient expressed concerns with current medication regimen.  Reported previous inpatient admissions.  Patient to be admitted for overnight observation.  Support, encouragement and reassurance was provided.  Total Time spent with patient: 15 minutes  Psychiatric Specialty Exam: Physical Exam  Vitals reviewed. Constitutional: He appears well-developed.  Psychiatric: He has a normal mood and affect.    Review of Systems  Psychiatric/Behavioral: The patient is nervous/anxious.   All other systems reviewed and are negative.   There were no vitals taken for this visit.There is no height or weight on file to calculate BMI.  General Appearance: Casual  Eye Contact:  Good  Speech:  Clear and Coherent  Volume:  Normal  Mood:  Anxious and Depressed  Affect:  Congruent  Thought Process:  Coherent  Orientation:  Full (Time, Place, and Person)  Thought Content:  Logical  Suicidal Thoughts:  Yes.  without intent/plan  Homicidal Thoughts:  No  Memory:  Immediate;   Fair Recent;   Fair  Judgement:  Fair  Insight:  Fair  Psychomotor Activity:  Restlessness and Tremor  Concentration: Concentration: Fair  Recall:  [place]: Fair  Akathisia:  No  Handed:  Right  AIMS (if indicated):     Assets:  Communication Skills Desire for Improvement Resilience Social Support  Sleep:       Musculoskeletal: Strength & Muscle Tone: within normal limits Gait & Station: normal Patient leans: N/A  There were no vitals taken for this visit.  Recommendations: Overnight observation recommended Based on my evaluation the patient does not appear to have an emergency medical condition.  Derrill Center, NP 02/02/2019, 6:07 PM

## 2019-02-02 NOTE — BH Assessment (Addendum)
Assessment Note  [name] is an 69 y.o. male that presents this date with S/I. Patient voices a plan to overdose on medical medications. Patient denies any H/I or AVH. Patient denies any previous attempts or gestures at self harm. Patient states he was diagnosed with depression over 20 years ago and his current medication regimen is managed by [facility] MD. Patient states when he last met with that provider medication changes were made at that time to address tremors.    HPI note from [facility] MD on 01/23/19. "I called and spoke with the patient. Patient stated that he read about Austedo for tardive dyskinesia on Internet and wants to give it a try. He has not noticed any improvement on higher dose of Ingrezza. He denies any side effects. I discussed trial of Austedo with him. Patient was explained that he will be started on starting dose of 12 mg twice a day. He was informed I will send the prescription to your pharmacy and he was reminded of his follow-up appointment on February 10 with me. I asked if he made any contact with his daughter, patient replied that he has not called her yet.  He also informed that she has been coughing either. He denied any acute distress at this time and seem to be happy about trying any medication to help tardive dyskinesia. Patient was explained to discontinue Ingrezza when he receives the [facility] prescription in his mail. And then to start a Austedo after a 24-hour washout."  Patient states since he has started implemented above medication changes his depression has worsened and brought on thoughts of self harm in the last week. Patient stated he has not noticed any change in his tremors and reports ongoing symptoms of depression to include: feeling useless, isolating and excessive fatigue. Patient states he feels his thoughts of self harm were brought on by the changes in medication/s. Patient also reports his relationship with his daughter who assist with his care has  deteriorated in the last two months. Patient reports prior mental health hospitalizations in the past although cannot recall his last admission. Patient is also followed by [facility] for tremors and peripheral neuropathy. Patient denies any history of SA use. Patient denies any prior attempts or gestures at self harm although reports ongoing S/I for the last week because "it's been years and everything just keeps getting worse." Patient states he currently resides alone and lacks social support. Patient per chart review has a lengthy history of OP visits although inpatient history is limited. Patient is oriented x 4 and presents with a depressed affect. Patient's thought process is organized and does not seem to be responding to any internal stimuli. Patient speaks in a low soft voice and is tearful at times as this Probation officer attempts to assess. Patient is exhibiting tremors of both hands. Patient did not appear to be responding to internal stimuli. Case was staffed with Bobby Rumpf NP who recommended patient be observed and monitored. Patient will be seen by psychiatry in the a.m.        Diagnosis: F33.2 MDD recurrent without psychotic features, severe  Past Medical History:  Past Medical History:  Diagnosis Date  . Depression   . Frequent urination   . GERD (gastroesophageal reflux disease)   . Herpes   . Hyperlipidemia   . Hypertension   . Memory loss   . Obesity   . Stroke ([place])   . Urgency of urination     Past Surgical History:  Procedure Laterality Date  . disckec    . SPINAL FUSION    . VASECTOMY      Family History:  Family History  Problem Relation Age of Onset  . Hypertension Mother   . Hypertension Father   . Hypertension Sister   . Hypertension Brother   . Cancer Maternal Aunt   . Cancer - Ovarian Maternal Grandmother   . Diabetes Mellitus I Paternal Grandmother   . Hypertension Paternal Grandfather   . Cancer Maternal Aunt     Social History:   reports that he has quit smoking. He has never used smokeless tobacco. He reports that he does not drink alcohol or use drugs.  Additional Social History:  Alcohol / Drug Use Pain Medications: See MAR Prescriptions: See MAR Over the Counter: See MAR History of alcohol / drug use?: No history of alcohol / drug abuse  CIWA:   COWS:    Allergies:  Allergies  Allergen Reactions  . Acyclovir And Related   . Aspirin Other (See Comments)    Dizziness, nausea and vomiting Balance issues  . Lisinopril Other (See Comments)    Critical Cough  . Lithium Other (See Comments)    Critical Per pt reaction unknown  . Oxycodone Nausea And Vomiting  . Oxycodone-Acetaminophen Nausea And Vomiting    Also causes dizziness    Home Medications: (Not in a hospital admission)   OB/GYN Status:  No LMP for male patient.  General Assessment Data Location of Assessment: [facility] Assessment Services TTS Assessment: In system Is this a Tele or Face-to-Face Assessment?: Face-to-Face Is this an Initial Assessment or a Re-assessment for this encounter?: Initial Assessment Patient Accompanied by:: N/A Language Other than English: No Living Arrangements: Other (Comment) What gender do you identify as?: Male Marital status: Single Living Arrangements: Alone Can pt return to current living arrangement?: Yes Admission Status: Voluntary Is patient capable of signing voluntary admission?: Yes Referral Source: Self/Family/Friend Insurance type: BC/BS/Medicare     Crisis Care Plan Living Arrangements: Alone Legal Guardian: (NA) Name of Psychiatrist: [facility] MD Name of Therapist: None  Education Status Is patient currently in school?: No Is the patient employed, unemployed or receiving disability?: Unemployed  Risk to self with the past 6 months Suicidal Ideation: Yes-Currently Present Has patient been a risk to self within the past 6 months prior to admission? : No Suicidal Intent: Yes-Currently  Present Has patient had any suicidal intent within the past 6 months prior to admission? : No Is patient at risk for suicide?: Yes Suicidal Plan?: Yes-Currently Present Has patient had any suicidal plan within the past 6 months prior to admission? : No Specify Current Suicidal Plan: Overdose Access to Means: Yes Specify Access to Suicidal Means: Pt states they have medications What has been your use of drugs/alcohol within the last 12 months?: Denies Previous Attempts/Gestures: No How many times?: 0 Other Self Harm Risks: (NA) Triggers for Past Attempts: (NA) Intentional Self Injurious Behavior: None Family Suicide History: No Recent stressful life event(s): Other (Comment)(Medication changes) Persecutory voices/beliefs?: No Depression: Yes Depression Symptoms: Feeling angry/irritable Substance abuse history and/or treatment for substance abuse?: No Suicide prevention information given to non-admitted patients: Not applicable  Risk to Others within the past 6 months Homicidal Ideation: No Does patient have any lifetime risk of violence toward others beyond the six months prior to admission? : No Thoughts of Harm to Others: No Current Homicidal Intent: No Current Homicidal Plan: No Access to Homicidal Means: No Identified Victim: NA History of  harm to others?: No Assessment of Violence: None Noted Violent Behavior Description: NA Does patient have access to weapons?: No Criminal Charges Pending?: No Does patient have a court date: No Is patient on probation?: No  Psychosis Hallucinations: None noted Delusions: None noted  Mental Status Report Appearance/Hygiene: Unremarkable Eye Contact: Good Motor Activity: Freedom of movement Speech: Logical/coherent Level of Consciousness: Quiet/awake Mood: Depressed Affect: Appropriate to circumstance Anxiety Level: Moderate Thought Processes: Coherent, Relevant Judgement: Partial Orientation: Person, Place, Time Obsessive  Compulsive Thoughts/Behaviors: None  Cognitive Functioning Concentration: Normal Memory: Recent Intact, Remote Intact Is patient IDD: No Insight: Fair Impulse Control: Fair Appetite: Good Have you had any weight changes? : No Change Sleep: No Change Total Hours of Sleep: 7 Vegetative Symptoms: None  ADLScreening [facility]) Patient's cognitive ability adequate to safely complete daily activities?: Yes Patient able to express need for assistance with ADLs?: Yes Independently performs ADLs?: Yes (appropriate for developmental age)  Prior Inpatient Therapy Prior Inpatient Therapy: Yes Prior Therapy Dates: Pt is unsure Prior Therapy Facilty/Provider(s): Pt cannot recall Reason for Treatment: MH issues  Prior Outpatient Therapy Prior Outpatient Therapy: Yes Prior Therapy Dates: Ongoing Prior Therapy Facilty/Provider(s): [facility] MD Reason for Treatment: Med mang Does patient have an ACCT team?: No Does patient have Intensive In-House Services?  : No Does patient have [facility] services? : No Does patient have P4CC services?: No  ADL Screening (condition at time of admission) Patient's cognitive ability adequate to safely complete daily activities?: Yes Is the patient deaf or have difficulty hearing?: No Does the patient have difficulty seeing, even when wearing glasses/contacts?: No Does the patient have difficulty concentrating, remembering, or making decisions?: No Patient able to express need for assistance with ADLs?: Yes Does the patient have difficulty dressing or bathing?: No Independently performs ADLs?: Yes (appropriate for developmental age) Does the patient have difficulty walking or climbing stairs?: No Weakness of Legs: None Weakness of Arms/Hands: None  Home Assistive Devices/Equipment Home Assistive Devices/Equipment: None  Therapy Consults (therapy consults require a physician order) PT Evaluation Needed: No OT Evalulation Needed: No SLP  Evaluation Needed: No Abuse/Neglect Assessment (Assessment to be complete while patient is alone) Abuse/Neglect Assessment Can Be Completed: Yes Physical Abuse: Denies Verbal Abuse: Denies Sexual Abuse: Denies Exploitation of patient/patient's resources: Denies Self-Neglect: Denies Values / Beliefs Cultural Requests During Hospitalization: None Spiritual Requests During Hospitalization: None Consults Spiritual Care Consult Needed: No Transition of Care Team Consult Needed: No Advance Directives (For Healthcare) Does Patient Have a Medical Advance Directive?: No Would patient like information on creating a medical advance directive?: No - Patient declined          Disposition: Case was staffed with Bobby Rumpf NP who recommended patient be observed and monitored. Patient will be seen by psychiatry in the a.m.    Disposition Initial Assessment Completed for this Encounter: Yes Disposition of Patient: Admit Type of inpatient treatment program: Adult Patient refused recommended treatment: No  On Site Evaluation by:   Reviewed with Physician:    Mamie Nick 02/02/2019 6:13 PM

## 2019-02-02 NOTE — Progress Notes (Signed)
Macai ESKER AGARD is a 69 y.o. male voluntary admitted as a walk in for suicidal ideations and increased depression. Pt live by himself, has a brother and a sister but does not live here in [place]. Pt has a daughter whom patient say does not want anything to do with him. Pt has a hx  Pt stated this history of tardive dyskinesia and is very tremulous. Pt stated his suicidal thoughts started when he stared this new medication (Austedo) for tremors. Pt still endorse passive SI at this time with plan to OD on his medications, but stated he will not act on it. Pt has been calm and cooperative, put on high fall risks due to tremulous.Pt transferred to 400 hall for further observations. Consents signed, skin/belongings search completed and pt oriented to unit. Pt stable at this time. Pt given the opportunity to express concerns and ask questions. Pt given toiletries. Will continue to monitor.

## 2019-02-02 NOTE — Plan of Care (Signed)
Interior Observation Crisis Plan  Reason for Crisis Plan:  Crisis Stabilization   Plan of Care:  Referral for IOP  Family Support:      Current Living Environment:  Living Arrangements: Alone  Insurance:   Hospital Account    Name Acct ID Class Status Primary Coverage   [name], [name] [id number] [place] - MEDICARE PART A AND B        Guarantor Account (for Hospital Account [id number])    Name Relation to Pt Service Area Active? Acct Type   [name] Self [facility] Yes [facility]   Address Phone       [address] [place], [place] [id number] [phone])          Coverage Information (for Hospital Account [id number])    1. [name] PART A AND B    F/O Payor/Plan Precert #   MEDICARE/MEDICARE PART A AND B    Subscriber Subscriber #   [name], [name] [id number]   Address Phone   [address] [place], [place] [address]        2. BLUE CROSS BLUE SHIELD/BCBS COMM PPO    F/O Payor/Plan Precert #   BLUE CROSS BLUE SHIELD/BCBS COMM PPO    Subscriber Subscriber #   [name], [name] [id number]   Address Phone   [address] [place], [place] [address] [phone]          Legal Guardian:  Legal Guardian: (NA)  Primary Care Provider:  Orpah Leon, Leon  Current Outpatient Providers:  [facility]  Psychiatrist:  Name of Psychiatrist: [name]  Counselor/Therapist:  Name of Therapist: None  Compliant with Medications:  Yes  Additional Information:   [name] 1/16/202111:48 PM

## 2019-02-02 NOTE — BH Assessment (Signed)
[facility] Assessment Progress Note   Case was staffed with Bobby Rumpf NP who recommended patient be observed and monitored. Patient will be seen by psychiatry in the a.m.

## 2019-02-03 DIAGNOSIS — F332 Major depressive disorder, recurrent severe without psychotic features: Secondary | ICD-10-CM | POA: Diagnosis not present

## 2019-02-03 DIAGNOSIS — E785 Hyperlipidemia, unspecified: Secondary | ICD-10-CM | POA: Diagnosis not present

## 2019-02-03 DIAGNOSIS — Z20822 Contact with and (suspected) exposure to covid-19: Secondary | ICD-10-CM | POA: Diagnosis not present

## 2019-02-03 DIAGNOSIS — G2401 Drug induced subacute dyskinesia: Secondary | ICD-10-CM | POA: Diagnosis not present

## 2019-02-03 DIAGNOSIS — I10 Essential (primary) hypertension: Secondary | ICD-10-CM | POA: Diagnosis not present

## 2019-02-03 DIAGNOSIS — K219 Gastro-esophageal reflux disease without esophagitis: Secondary | ICD-10-CM | POA: Diagnosis not present

## 2019-02-03 LAB — COMPREHENSIVE METABOLIC PANEL
ALT: 15 U/L (ref 0–44)
AST: 15 U/L (ref 15–41)
Albumin: 4.1 g/dL (ref 3.5–5.0)
Alkaline Phosphatase: 40 U/L (ref 38–126)
Anion gap: 10 (ref 5–15)
BUN: 11 mg/dL (ref 8–23)
CO2: 29 mmol/L (ref 22–32)
Calcium: 8.9 mg/dL (ref 8.9–10.3)
Chloride: 100 mmol/L (ref 98–111)
Creatinine, Ser: 1.01 mg/dL (ref 0.61–1.24)
GFR calc Af Amer: >60 mL/min (ref >60)
GFR calc non Af Amer: >60 mL/min (ref >60)
Glucose, Bld: 97 mg/dL (ref 70–99)
Potassium: 3.5 mmol/L (ref 3.5–5.1)
Sodium: 139 mmol/L (ref 135–145)
Total Bilirubin: 0.8 mg/dL (ref 0.3–1.2)
Total Protein: 7 g/dL (ref 6.5–8.1)

## 2019-02-03 LAB — CBC
HCT: 40.3 % (ref 39.0–52.0)
Hemoglobin: 13.9 g/dL (ref 13.0–17.0)
MCH: 33.7 pg (ref 26.0–34.0)
MCHC: 34.5 g/dL (ref 30.0–36.0)
MCV: 97.6 fL (ref 80.0–100.0)
Platelets: 328 10*3/uL (ref 150–400)
RBC: 4.13 MIL/uL — ABNORMAL LOW (ref 4.22–5.81)
RDW: 12.6 % (ref 11.5–15.5)
WBC: 6.6 10*3/uL (ref 4.0–10.5)
nRBC: 0 % (ref 0.0–0.2)

## 2019-02-03 NOTE — Progress Notes (Signed)
[place] NOVEL CORONAVIRUS (COVID-19) DAILY CHECK-OFF SYMPTOMS - answer yes or no to each - every day NO YES  Have you had a fever in the past 24 hours?  . Fever (Temp > 37.80C / 100F) X   Have you had any of these symptoms in the past 24 hours? . New Cough .  Sore Throat  .  Shortness of Breath .  Difficulty Breathing .  Unexplained Body Aches   X   Have you had any one of these symptoms in the past 24 hours not related to allergies?   . Runny Nose .  Nasal Congestion .  Sneezing   X   If you have had runny nose, nasal congestion, sneezing in the past 24 hours, has it worsened?  X   EXPOSURES - check yes or no X   Have you traveled outside the state in the past 14 days?  X   Have you been in contact with someone with a confirmed diagnosis of COVID-19 or PUI in the past 14 days without wearing appropriate PPE?  X   Have you been living in the same home as a person with confirmed diagnosis of COVID-19 or a PUI (household contact)?    X   Have you been diagnosed with COVID-19?    X              What to do next: Answered NO to all: Answered YES to anything:   Proceed with unit schedule Follow the [facility] Inpatient Flowsheet.   

## 2019-02-03 NOTE — Progress Notes (Signed)
   Discharge:  Pt is a black male of 68 years. Pt is alert and oriented X 4. Pt given AVS packet to include medication information update, future appointment information, and additional resource information. Pt given all belongings and signature obtained. Pt discharged to private resident with friend. Transportation provided my friend.      Lolly Mustache. Bobby Rumpf MSN, Protection, [facility] [phone]

## 2019-02-03 NOTE — H&P (Addendum)
[facility] Provider Admission PAA/H&P  Patient Identification: [name] MRN:  [id number] Date of Evaluation:  02/03/2019 Chief Complaint:  Severe recurrent major depression without psychotic features ([place]) [F33.2] Principal Diagnosis: Severe recurrent major depression without psychotic features ([place]) Diagnosis:  Principal Problem:   Severe recurrent major depression without psychotic features ([place]) Active Problems:   Tardive dyskinesia  History of Present Illness:  [name] is an 69 y.o. male that presents this date with S/I. Patient voices a plan to overdose on medical medications. Patient denies any H/I or AVH. Patient denies any previous attempts or gestures at self harm. Patient states he was diagnosed with depression over 20 years ago and his current medication regimen is managed by [facility]. Patient states when he last met with that provider medication changes were made at that time to address tremors.    HPI note from [facility] on 01/23/19. "I called and spoke with the patient. Patient stated that he read about Austedo for tardive dyskinesia on Internet and wants to give it a try. He has not noticed any improvement on higher dose of Ingrezza. He denies any side effects. I discussed trial ofAustedowith him. Patient was explained that he will be started on starting dose of 12 mg twice a day. He was informed I will send the prescription to your pharmacy and he was reminded of his follow-up appointment on February 10 with me. I asked if he made any contact with his daughter, patient replied that he has not called her yet. He also informed that she has been coughing either. He denied any acute distress at this time and seem to be happy about trying any medication to help tardive dyskinesia. Patient was explained to discontinue Ingrezza when he receives the [place] prescription in his mail. And then to start a Austedoafter a 24-hour washout."  Patient states since he has  started implemented above medication changes his depression has worsened and brought on thoughts of self harm in the last week. Patient stated he has not noticed any change in his tremors and reports ongoing symptoms of depression to include: feeling useless, isolating and excessive fatigue. Patient states he feels his thoughts of self harm were brought on by the changes in medication/s. Patient also reports his relationship with his daughter who assist with his care has deteriorated in the last two months. Patient reports prior mental health hospitalizations in the past although cannot recall his last admission. Patient is also followed by [facility] for tremors and peripheral neuropathy. Patient denies any history of SA use. Patient denies any prior attempts or gestures at self harm although reports ongoing S/I for the last week because "it's been years and everything just keeps getting worse." Patient states he currently resides alone and lacks social support. Patient per chart review has a lengthy history of OP visits although inpatient history is limited. Patient is oriented x 4 and presents with a depressed affect. Patient's thought process is organized and does not seem to be responding to any internal stimuli. Patient speaks in a low soft voice and is tearful at times as this Probation officer attempts to assess. Patient is exhibiting tremors of both hands. Patient did not appear to be responding to internal stimuli. Case was staffed with Bobby Rumpf NP who recommended patient be observed and monitored. Patient will be seen by psychiatry in the a.m.     Associated Signs/Symptoms: Depression Symptoms:  depressed mood, anhedonia, insomnia, feelings of worthlessness/guilt, hopelessness, suicidal thoughts without  plan, anxiety, (Hypo) Manic Symptoms:  denies, none noted Anxiety Symptoms:  Excessive Worry, Psychotic Symptoms:  Denies, none noted PTSD Symptoms: Negative Total Time spent with  patient: 20 minutes  Past Psychiatric History: MDD  Is the patient at risk to self? Yes.    Has the patient been a risk to self in the past 6 months? Yes.    Has the patient been a risk to self within the distant past? Yes.    Is the patient a risk to others? No.  Has the patient been a risk to others in the past 6 months? No.  Has the patient been a risk to others within the distant past? No.   Prior Inpatient Therapy: Prior Inpatient Therapy: Yes Prior Therapy Dates: Pt is unsure Prior Therapy Facilty/Provider(s): Pt cannot recall Reason for Treatment: MH issues Prior Outpatient Therapy: Prior Outpatient Therapy: Yes Prior Therapy Dates: Ongoing Prior Therapy Facilty/Provider(s): [facility] Reason for Treatment: Med mang Does patient have an ACCT team?: No Does patient have Intensive In-House Services?  : No Does patient have [facility] services? : No Does patient have P4CC services?: No  Alcohol Screening: 1. How often do you have a drink containing alcohol?: Never 2. How many drinks containing alcohol do you have on a typical day when you are drinking?: 1 or 2 3. How often do you have six or more drinks on one occasion?: Never AUDIT-C Score: 0 4. How often during the last year have you found that you were not able to stop drinking once you had started?: Never 5. How often during the last year have you failed to do what was normally expected from you becasue of drinking?: Never 6. How often during the last year have you needed a first drink in the morning to get yourself going after a heavy drinking session?: Never 7. How often during the last year have you had a feeling of guilt of remorse after drinking?: Never 8. How often during the last year have you been unable to remember what happened the night before because you had been drinking?: Never 9. Have you or someone else been injured as a result of your drinking?: No 10. Has a relative or friend or a doctor or another health worker  been concerned about your drinking or suggested you cut down?: No Alcohol Use Disorder Identification Test Final Score (AUDIT): 0 Substance Abuse History in the last 12 months:  No. Consequences of Substance Abuse: NA Previous Psychotropic Medications: Yes  Psychological Evaluations: Yes  Past Medical History:  Past Medical History:  Diagnosis Date  . Depression   . Frequent urination   . GERD (gastroesophageal reflux disease)   . Herpes   . Hyperlipidemia   . Hypertension   . Memory loss   . Obesity   . Stroke ([place])   . Urgency of urination     Past Surgical History:  Procedure Laterality Date  . disckec    . SPINAL FUSION    . VASECTOMY     Family History:  Family History  Problem Relation Age of Onset  . Hypertension Mother   . Hypertension Father   . Hypertension Sister   . Hypertension Brother   . Cancer Maternal Aunt   . Cancer - Ovarian Maternal Grandmother   . Diabetes Mellitus I Paternal Grandmother   . Hypertension Paternal Grandfather   . Cancer Maternal Aunt    Family Psychiatric History: no pertinent history Tobacco Screening:   Social History:  Social History  Substance and Sexual Activity  Alcohol Use No  . Alcohol/week: 1.0 standard drinks  . Types: 1 Cans of beer per week   Comment: socially     Social History   Substance and Sexual Activity  Drug Use No    Additional Social History: Marital status: Single    Pain Medications: See MAR Prescriptions: See MAR Over the Counter: See MAR History of alcohol / drug use?: No history of alcohol / drug abuse                    Allergies:   Allergies  Allergen Reactions  . Acyclovir And Related   . Aspirin Other (See Comments)    Dizziness, nausea and vomiting Balance issues  . Lisinopril Other (See Comments)    Critical Cough  . Lithium Other (See Comments)    Critical Per pt reaction unknown  . Oxycodone Nausea And Vomiting  . Oxycodone-Acetaminophen Nausea And Vomiting     Also causes dizziness   Lab Results:  Results for orders placed or performed during the hospital encounter of 02/02/19 (from the past 48 hour(s))  Respiratory Panel by RT PCR (Flu A&B, Covid) - Nasopharyngeal Swab     Status: None   Collection Time: 02/02/19  6:42 PM   Specimen: Nasopharyngeal Swab  Result Value Ref Range   SARS Coronavirus 2 by RT PCR NEGATIVE NEGATIVE    Comment: (NOTE) SARS-CoV-2 target nucleic acids are NOT DETECTED. The SARS-CoV-2 RNA is generally detectable in upper respiratoy specimens during the acute phase of infection. The lowest concentration of SARS-CoV-2 viral copies this assay can detect is 131 copies/mL. A negative result does not preclude SARS-Cov-2 infection and should not be used as the sole basis for treatment or other patient management decisions. A negative result may occur with  improper specimen collection/handling, submission of specimen other than nasopharyngeal swab, presence of viral mutation(s) within the areas targeted by this assay, and inadequate number of viral copies (<131 copies/mL). A negative result must be combined with clinical observations, patient history, and epidemiological information. The expected result is Negative. Fact Sheet for Patients:  PinkCheek.be Fact Sheet for Healthcare Providers:  GravelBags.it This test is not yet ap proved or cleared by the [place] FDA and  has been authorized for detection and/or diagnosis of SARS-CoV-2 by FDA under an Emergency Use Authorization (EUA). This EUA will remain  in effect (meaning this test can be used) for the duration of the COVID-19 declaration under Section 564(b)(1) of the Act, 21 U.S.C. section 360bbb-3(b)(1), unless the authorization is terminated or revoked sooner.    Influenza A by PCR NEGATIVE NEGATIVE   Influenza B by PCR NEGATIVE NEGATIVE    Comment: (NOTE) The Xpert Xpress SARS-CoV-2/FLU/RSV  assay is intended as an aid in  the diagnosis of influenza from Nasopharyngeal swab specimens and  should not be used as a sole basis for treatment. Nasal washings and  aspirates are unacceptable for Xpert Xpress SARS-CoV-2/FLU/RSV  testing. Fact Sheet for Patients: PinkCheek.be Fact Sheet for Healthcare Providers: GravelBags.it This test is not yet approved or cleared by the [place] FDA and  has been authorized for detection and/or diagnosis of SARS-CoV-2 by  FDA under an Emergency Use Authorization (EUA). This EUA will remain  in effect (meaning this test can be used) for the duration of the  Covid-19 declaration under Section 564(b)(1) of the Act, 21  U.S.C. section 360bbb-3(b)(1), unless the authorization is  terminated or revoked. Performed at [facility], [place] [address], [place], [place] [address]     Blood Alcohol level:  Lab Results  Component Value Date   [facility] <11 73/53/2992    Metabolic Disorder Labs:  Lab Results  Component Value Date   HGBA1C 5.5 05/29/2014   MPG 120 (H) 12/20/2009   No results found for: PROLACTIN Lab Results  Component Value Date   CHOL 150 05/29/2014   TRIG 284 (H) 05/29/2014   HDL 30 (L) 05/29/2014   CHOLHDL 5.0 05/29/2014   VLDL 49 (H) 12/20/2009   LDLCALC 63 05/29/2014   LDLCALC (H) 12/20/2009    149        Total Cholesterol/HDL:CHD Risk Coronary Heart Disease Risk Table                     Men   Women  1/2 Average Risk   3.4   3.3  Average Risk       5.0   4.4  2 X Average Risk   9.6   7.1  3 X Average Risk  23.4   11.0        Use the calculated Patient Ratio above and the CHD Risk Table to determine the patient's CHD Risk.        ATP III CLASSIFICATION (LDL):  <100     mg/dL   Optimal  100-129  mg/dL   Near or Above                    Optimal  130-159  mg/dL   Borderline  160-189  mg/dL   High  >190     mg/dL   Very High    Current  Medications: Current Facility-Administered Medications  Medication Dose Route Frequency Provider Last Rate Last Admin  . alum & mag hydroxide-simeth (MAALOX/MYLANTA) 200-200-20 MG/5ML suspension 30 mL  30 mL Oral Q4H PRN Lindon Romp A, NP      . amLODipine (NORVASC) tablet 10 mg  10 mg Oral Daily Lindon Romp A, NP      . clopidogrel (PLAVIX) tablet 75 mg  75 mg Oral Daily Rozetta Nunnery, NP      . Deutetrabenazine TABS 12 mg  12 mg Oral BID Lindon Romp A, NP      . irbesartan (AVAPRO) tablet 300 mg  300 mg Oral Daily Lindon Romp A, NP      . magnesium hydroxide (MILK OF MAGNESIA) suspension 30 mL  30 mL Oral Daily PRN Lindon Romp A, NP      . metoprolol succinate (TOPROL-XL) 24 hr tablet 25 mg  25 mg Oral Daily Rozetta Nunnery, NP      . PARoxetine (PAXIL) tablet 40 mg  40 mg Oral QHS Lindon Romp A, NP   40 mg at 02/02/19 2334  . pravastatin (PRAVACHOL) tablet 10 mg  10 mg Oral q1800 Lindon Romp A, NP      . primidone (MYSOLINE) tablet 250 mg  250 mg Oral BID Lindon Romp A, NP   250 mg at 02/02/19 2334  . QUEtiapine (SEROQUEL) tablet 50 mg  50 mg Oral QHS Lindon Romp A, NP   50 mg at 02/02/19 2334   PTA Medications: Medications Prior to Admission  Medication Sig Dispense Refill Last Dose  . amLODipine (NORVASC) 10 MG tablet Take 10 mg by mouth daily.     . candesartan (ATACAND) 16 MG tablet Take 32 mg by mouth daily.      . clopidogrel (  PLAVIX) 75 MG tablet Take 1 tablet (75 mg total) by mouth daily. 30 tablet 11   . Deutetrabenazine (AUSTEDO) 12 MG TABS Take 12 mg by mouth 2 (two) times daily. 60 tablet 1   . Fluocinonide 0.1 % CREA APPLY 2-3 GRAMS TO AFFECTED AREAS 3-4 TIMES DAILY (1 GRAM = 1 DIME SIZE)  3   . lovastatin (MEVACOR) 10 MG tablet Take 10 mg by mouth at bedtime.     . meloxicam (MOBIC) 7.5 MG tablet Take 7.5 mg by mouth daily.     . metoprolol succinate (TOPROL-XL) 25 MG 24 hr tablet 25 mg.      . PARoxetine (PAXIL) 40 MG tablet Take 1 tablet (40 mg total) by mouth at  bedtime. 30 tablet 1   . primidone (MYSOLINE) 250 MG tablet Take 1 tablet (250 mg total) by mouth 2 (two) times daily. 60 tablet 12   . QUEtiapine (SEROQUEL) 50 MG tablet Take 1 tablet (50 mg total) by mouth at bedtime. 30 tablet 1   . valACYclovir (VALTREX) 1000 MG tablet Take 1,000 mg by mouth daily.       Musculoskeletal: Strength & Muscle Tone: within normal limits Gait & Station: normal Patient leans: N/A  Psychiatric Specialty Exam: Physical Exam  Constitutional: He is oriented to person, place, and time. He appears well-developed and well-nourished. No distress.  HENT:  Head: Normocephalic.  Respiratory: Effort normal. No respiratory distress.  Musculoskeletal:        General: Normal range of motion.  Neurological: He is alert and oriented to person, place, and time.  Skin: He is not diaphoretic.  Psychiatric: His mood appears anxious. He exhibits a depressed mood. He expresses suicidal ideation. He expresses no suicidal plans.    Review of Systems  Constitutional: Positive for appetite change. Negative for activity change, chills, diaphoresis, fatigue, fever and unexpected weight change.  Respiratory: Negative.   Cardiovascular: Negative.   Gastrointestinal: Negative.   Neurological: Positive for tremors.  Psychiatric/Behavioral: Positive for dysphoric mood, sleep disturbance and suicidal ideas. The patient is nervous/anxious.     Blood pressure (!) 150/101, pulse 62, temperature 97.8 F (36.6 C), temperature source Oral, resp. rate 18.There is no height or weight on file to calculate BMI.  General Appearance: Casual  Eye Contact:  Good  Speech:  Clear and Coherent and Normal Rate  Volume:  Normal  Mood:  Anxious and Depressed  Affect:  Congruent  Thought Process:  Coherent  Orientation:  Full (Time, Place, and Person)  Thought Content:  Logical  Suicidal Thoughts:  Yes.  without intent/plan  Homicidal Thoughts:  No  Memory:  Immediate;   Fair Recent;   Fair   Judgement:  Fair  Insight:  Fair  Psychomotor Activity:  Restlessness and Tremor  Concentration:  Concentration: Fair  Recall:  [employer] of Knowledge:  Fair  Language:  Fair  Akathisia:  Negative  Handed:  Right  AIMS (if indicated):     Assets:  Communication Skills Desire for Improvement Resilience Social Support  ADL's:  Intact  Cognition:  WNL  Sleep:         Treatment Plan Summary: Daily contact with patient to assess and evaluate symptoms and progress in treatment and Medication management  Observation Level/Precautions:  15 minute checks Laboratory:  CBC Chemistry Profile UDS Psychotherapy:  Individual Medications:   Resumed home medications. Austedo12 mg BID for TD Paxil 40 mg QHS for depression Primidone 250 mg BID for tremors Seroquel 50 mg QHS for  insomnia Norvasc 10 mg daily for HTN irbesatan 300 mg daily for HTN Metoprolol-XL 25 mg daily for HTN Plavix 75 mg daily for stroke history/prevention pravastatin 10 mg daily for hyperlipidemia   Consultations:   Discharge Concerns:  safety Estimated LOS: Other:      Rozetta Nunnery, NP 1/17/202112:32 AM

## 2019-02-03 NOTE — Discharge Summary (Addendum)
Physician Discharge Summary Note  Patient:  [name] is an 69 y.o., male MRN:  [id number] DOB:  1950-12-18 Patient phone:  [phone] (home)  Patient address:   [address] [place] [id number],  Total Time spent with patient: 45 minutes  Date of Admission:  02/02/2019 Date of Discharge: 02/03/2019  Reason for Admission: Suicidal ideations, request medication change  Principal Problem: Severe recurrent major depression without psychotic features [facility]) Discharge Diagnoses: Principal Problem:   Severe recurrent major depression without psychotic features ([place]) Active Problems:   Tardive dyskinesia   Past Psychiatric History: Major depressive disorder  Past Medical History:  Past Medical History:  Diagnosis Date  . Depression   . Frequent urination   . GERD (gastroesophageal reflux disease)   . Herpes   . Hyperlipidemia   . Hypertension   . Memory loss   . Obesity   . Stroke ([place])   . Urgency of urination     Past Surgical History:  Procedure Laterality Date  . disckec    . SPINAL FUSION    . VASECTOMY     Family History:  Family History  Problem Relation Age of Onset  . Hypertension Mother   . Hypertension Father   . Hypertension Sister   . Hypertension Brother   . Cancer Maternal Aunt   . Cancer - Ovarian Maternal Grandmother   . Diabetes Mellitus I Paternal Grandmother   . Hypertension Paternal Grandfather   . Cancer Maternal Aunt    Family Psychiatric  History: Denies Social History:  Social History   Substance and Sexual Activity  Alcohol Use No  . Alcohol/week: 1.0 standard drinks  . Types: 1 Cans of beer per week   Comment: socially     Social History   Substance and Sexual Activity  Drug Use No    Social History   Socioeconomic History  . Marital status: Legally Separated    Spouse name: Not on file  . Number of children: 1  . Years of education: Masters  . Highest education level: Not on file  Occupational History  .  Occupation: retired    Fish farm manager: RETIRED  Tobacco Use  . Smoking status: Former Research scientist (life sciences)  . Smokeless tobacco: Never Used  Substance and Sexual Activity  . Alcohol use: No    Alcohol/week: 1.0 standard drinks    Types: 1 Cans of beer per week    Comment: socially  . Drug use: No  . Sexual activity: Never  Other Topics Concern  . Not on file  Social History Narrative   Patient is separated.   Patient lives alone and has one child.   Patient is retired.   Patient is right-handed.   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   Patient does not drink any caffeine.   Social Determinants of Health   Financial Resource Strain:   . Difficulty of Paying Living Expenses: Not on file  Food Insecurity:   . Worried About Charity fundraiser in the Last Year: Not on file  . Ran Out of Food in the Last Year: Not on file  Transportation Needs:   . Lack of Transportation (Medical): Not on file  . Lack of Transportation (Non-Medical): Not on file  Physical Activity:   . Days of Exercise per Week: Not on file  . Minutes of Exercise per Session: Not on file  Stress:   . Feeling of Stress : Not on file  Social Connections:   . Frequency of Communication with  Friends and Family: Not on file  . Frequency of Social Gatherings with Friends and Family: Not on file  . Attends Religious Services: Not on file  . Active Member of Clubs or Organizations: Not on file  . Attends Archivist Meetings: Not on file  . Marital Status: Not on file    Hospital Course: Patient admitted for observation, complained of suicidal ideations and tremors.  Patient observed overnight, patient reports average sleep and average appetite.  Patient seen by nurse practitioner.  Patient alert and oriented, answers appropriately.  Patient with visible tremor.  Patient denies suicidal and homicidal ideations.  Patient denies history of self-harm.  Patient lives alone, denies access to weapons.  Patient denies auditory and  visual hallucinations.  Patient reports concerns "my daughter has become estranged, she wants me in an assisted living home and she does not want me driving a car with tremors." Patient followed outpatient by psychiatry and neurology, patient plans to follow-up with both the psychiatry and neurology upon discharge.  Patient seen along with Dr. Darleene Cleaver.  Physical Findings: AIMS: Facial and Oral Movements Muscles of Facial Expression: None, normal Lips and Perioral Area: None, normal Jaw: None, normal Tongue: None, normal,Extremity Movements Upper (arms, wrists, hands, fingers): Severe Lower (legs, knees, ankles, toes): None, normal, Trunk Movements Neck, shoulders, hips: None, normal, Overall Severity Severity of abnormal movements (highest score from questions above): Severe Incapacitation due to abnormal movements: Minimal Patient's awareness of abnormal movements (rate only patient's report): Aware, moderate distress, Dental Status Current problems with teeth and/or dentures?: Yes(inplant to left lower teeth.) Does patient usually wear dentures?: No  CIWA:  CIWA-Ar Total: 2 COWS:  COWS Total Score: 7  Musculoskeletal: Strength & Muscle Tone: within normal limits Gait & Station: normal Patient leans: N/A  Psychiatric Specialty Exam: Physical Exam  Nursing note and vitals reviewed. Constitutional: He is oriented to person, place, and time. He appears well-developed.  HENT:  Head: Normocephalic.  Cardiovascular: Normal rate.  Respiratory: Effort normal.  Neurological: He is alert and oriented to person, place, and time.  Psychiatric: His speech is normal and behavior is normal. Judgment and thought content normal. Cognition and memory are normal. He exhibits a depressed mood.    Review of Systems  Constitutional: Negative.   HENT: Negative.   Eyes: Negative.   Respiratory: Negative.   Cardiovascular: Negative.   Gastrointestinal: Negative.   Genitourinary: Negative.    Musculoskeletal: Negative.   Skin: Negative.   Neurological: Negative.     Blood pressure (!) 159/87, pulse (!) 58, temperature 98.3 F (36.8 C), temperature source Oral, resp. rate 18, SpO2 98 %.There is no height or weight on file to calculate BMI.  General Appearance: Casual and Fairly Groomed  Eye Contact:  Good  Speech:  Clear and Coherent and Normal Rate  Volume:  Decreased  Mood:  Depressed  Affect:  Appropriate and Congruent  Thought Process:  Coherent, Goal Directed and Descriptions of Associations: Intact  Orientation:  Full (Time, Place, and Person)  Thought Content:  WDL and Logical  Suicidal Thoughts:  No  Homicidal Thoughts:  No  Memory:  Immediate;   Good Recent;   Good Remote;   Good  Judgement:  Good  Insight:  Fair  Psychomotor Activity:  Normal  Concentration:  Concentration: Good and Attention Span: Good  Recall:  Good  Fund of Knowledge:  Good  Language:  Good  Akathisia:  No  Handed:  Right  AIMS (if indicated):  Assets:  Communication Skills Desire for Improvement Financial Resources/Insurance Housing Intimacy Leisure Time Physical Health Resilience Social Support  ADL's:  Intact  Cognition:  WNL  Sleep:           Has this patient used any form of tobacco in the last 30 days? (Cigarettes, Smokeless Tobacco, Cigars, and/or Pipes) Yes, No  Blood Alcohol level:  Lab Results  Component Value Date   ETH <11 [id number]    Metabolic Disorder Labs:  Lab Results  Component Value Date   HGBA1C 5.5 05/29/2014   MPG 120 (H) 12/20/2009   No results found for: PROLACTIN Lab Results  Component Value Date   CHOL 150 05/29/2014   TRIG 284 (H) 05/29/2014   HDL 30 (L) 05/29/2014   CHOLHDL 5.0 05/29/2014   VLDL 49 (H) 12/20/2009   LDLCALC 63 05/29/2014   LDLCALC (H) 12/20/2009    149        Total Cholesterol/HDL:CHD Risk Coronary Heart Disease Risk Table                     Men   Women  1/2 Average Risk   3.4   3.3  Average Risk        5.0   4.4  2 X Average Risk   9.6   7.1  3 X Average Risk  23.4   11.0        Use the calculated Patient Ratio above and the CHD Risk Table to determine the patient's CHD Risk.        ATP III CLASSIFICATION (LDL):  <100     mg/dL   Optimal  100-129  mg/dL   Near or Above                    Optimal  130-159  mg/dL   Borderline  160-189  mg/dL   High  >190     mg/dL   Very High    See Psychiatric Specialty Exam and Suicide Risk Assessment completed by Attending Physician prior to discharge.  Discharge destination:  Home  Is patient on multiple antipsychotic therapies at discharge:  No   Has Patient had three or more failed trials of antipsychotic monotherapy by history:  No  Recommended Plan for Multiple Antipsychotic Therapies: NA   Allergies as of 02/03/2019      Reactions   Acyclovir And Related    Aspirin Other (See Comments)   Dizziness, nausea and vomiting Balance issues   Lisinopril Other (See Comments)   Critical Cough   Lithium Other (See Comments)   Critical Per pt reaction unknown   Oxycodone Nausea And Vomiting   Oxycodone-acetaminophen Nausea And Vomiting   Also causes dizziness      Medication List    TAKE these medications     Indication  amLODipine 10 MG tablet Commonly known as: NORVASC Take 10 mg by mouth daily.  Indication: High Blood Pressure Disorder   Austedo 12 MG Tabs Generic drug: Deutetrabenazine Take 12 mg by mouth 2 (two) times daily.  Indication: Tardive Dyskinesia   candesartan 16 MG tablet Commonly known as: ATACAND Take 32 mg by mouth daily.  Indication: High Blood Pressure Disorder   clopidogrel 75 MG tablet Commonly known as: PLAVIX Take 1 tablet (75 mg total) by mouth daily.  Indication: Disease of the Peripheral Arteries   Fluocinonide 0.1 % Crea APPLY 2-3 GRAMS TO AFFECTED AREAS 3-4 TIMES DAILY (1 GRAM = 1 DIME SIZE)  Indication:  Atopic Dermatitis   lovastatin 10 MG tablet Commonly known as: MEVACOR Take 10  mg by mouth at bedtime.  Indication: High Amount of Fats in the Blood   meloxicam 7.5 MG tablet Commonly known as: MOBIC Take 7.5 mg by mouth daily.  Indication: Joint Damage causing Pain and Loss of Function   metoprolol succinate 25 MG 24 hr tablet Commonly known as: TOPROL-XL 25 mg.  Indication: High Blood Pressure Disorder   PARoxetine 40 MG tablet Commonly known as: PAXIL Take 1 tablet (40 mg total) by mouth at bedtime.  Indication: Major Depressive Disorder   primidone 250 MG tablet Commonly known as: MYSOLINE Take 1 tablet (250 mg total) by mouth 2 (two) times daily.  Indication: Fine to Coarse Slow Tremor affecting Head, Hands & Voice   QUEtiapine 50 MG tablet Commonly known as: SEROQUEL Take 1 tablet (50 mg total) by mouth at bedtime.  Indication: Major Depressive Disorder   valACYclovir 1000 MG tablet Commonly known as: VALTREX Take 1,000 mg by mouth daily.  Indication: Herpes Simplex Infection        Follow-up recommendations:  Other:  Follow-up with outpatient psychiatrist and outpatient neurologist  Comments: Discharge  Signed: Emmaline Kluver, [place] 02/03/2019, 11:02 AM  Patient seen face-to-face for psychiatric evaluation, chart reviewed and case discussed with the physician extender and developed treatment plan. Reviewed the information documented and agree with the treatment plan. Corena Pilgrim, MD

## 2019-02-03 NOTE — Progress Notes (Addendum)
Pt is black male of 55 year, presents voluntarily with hx of SI,  HTN, and moderate to heavy tremors.    D- Patient alert and oriented x 4. Patient presents in a pleasant mood on assessment and stating that he slept "good" last night  denies using medication to sleep.  Appetite reported as "poor" and energy level reported "low". Patient endorsed depression at 9 of 10 and anxiety at 8of 10. he reports no pain today and last BM this morning.    Patient denies SI, HI, and AVH at this time.  A- Scheduled medications administered to patient, per MD orders.  Vital signs out of range, being monitored. Pt has changed his mind and does not want his daughter notified of his progress.  Support and encouragement provided.  Routine safety checks conducted every 15 minutes.  Patient agreed to notify staff with problems or concerns.   R- No adverse reactions to medication observed. Patient compliant with medications and treatment plan. Patient receptive, calm, and cooperative. Patient interacts well with others on the unit.  Patient remains safe at this time.    Lolly Mustache. Bobby Rumpf MSN, [place], [facility] [phone]

## 2019-02-03 NOTE — [facility] Suicide Risk Assessment (Cosign Needed)
Suicide Risk Assessment  Discharge Assessment   [facility] Discharge Suicide Risk Assessment   Principal Problem: Severe recurrent major depression without psychotic features [facility]) Discharge Diagnoses: Principal Problem:   Severe recurrent major depression without psychotic features ([place]) Active Problems:   Tardive dyskinesia   Total Time spent with patient: 30 minutes  Musculoskeletal: Strength & Muscle Tone: within normal limits Gait & Station: normal Patient leans: N/A  Psychiatric Specialty Exam:   Blood pressure (!) 159/87, pulse (!) 58, temperature 98.3 F (36.8 C), temperature source Oral, resp. rate 18, SpO2 98 %.There is no height or weight on file to calculate BMI.  General Appearance: Casual and Fairly Groomed  Eye Contact::  Good  Speech:  Clear and Coherent and Normal Rate409  Volume:  Normal  Mood:  Depressed  Affect:  Congruent and Depressed  Thought Process:  Coherent, Goal Directed and Descriptions of Associations: Intact  Orientation:  Full (Time, Place, and Person)  Thought Content:  WDL and Logical  Suicidal Thoughts:  No  Homicidal Thoughts:  No  Memory:  Immediate;   Good Recent;   Good Remote;   Good  Judgement:  Fair  Insight:  Fair  Psychomotor Activity:  Normal  Concentration:  Good  Recall:  Good  Fund of Knowledge:Good  Language: Good  Akathisia:  No  Handed:  Right  AIMS (if indicated):     Assets:  Communication Skills Desire for Improvement Financial Resources/Insurance Housing Leisure Time Resilience Social Support  Sleep:     Cognition: WNL  ADL's:  Intact   Mental Status Per Nursing Assessment::   On Admission:  Self-harm thoughts  Demographic Factors:  Male, Age 35 or older and Living alone  Loss Factors: Decline in physical health  Historical Factors: NA  Risk Reduction Factors:   Living with another person, especially a relative, Positive social support, Positive therapeutic relationship and Positive coping skills or  problem solving skills  Continued Clinical Symptoms:  Depression:   Anhedonia  Cognitive Features That Contribute To Risk:  None    Suicide Risk:  Minimal: No identifiable suicidal ideation.  Patients presenting with no risk factors but with morbid ruminations; may be classified as minimal risk based on the severity of the depressive symptoms    Plan Of Care/Follow-up recommendations:  Other:  Follow up with outpatient psychiatrist and outpatient neurologist  Emmaline Kluver, [place] 02/03/2019, 10:56 AM

## 2019-02-07 ENCOUNTER — Encounter: Payer: Self-pay | Admitting: Psychiatry

## 2019-02-07 ENCOUNTER — Other Ambulatory Visit: Payer: Self-pay

## 2019-02-07 ENCOUNTER — Ambulatory Visit (INDEPENDENT_AMBULATORY_CARE_PROVIDER_SITE_OTHER): Payer: Medicare Other | Admitting: Psychiatry

## 2019-02-07 DIAGNOSIS — G2401 Drug induced subacute dyskinesia: Secondary | ICD-10-CM

## 2019-02-07 DIAGNOSIS — F3341 Major depressive disorder, recurrent, in partial remission: Secondary | ICD-10-CM

## 2019-02-07 MED ORDER — BUSPIRONE HCL 10 MG PO TABS: 10.0000 mg | ORAL_TABLET | Freq: Three times a day (TID) | ORAL | 1 refills | Status: DC

## 2019-02-07 NOTE — Progress Notes (Signed)
[facility] Progress Note  I connected with  [name] on 02/07/19 by a video enabled telemedicine application and verified that I am speaking with the correct person using two identifiers.   I discussed the limitations of evaluation and management by telemedicine. The patient expressed understanding and agreed to proceed.    02/07/2019 2:14 PM [name]  MRN:  [id number]  Chief Complaint: " My tremors are really bad."  HPI: Patient recently went into the psychiatry emergency room for onset of suicidal ideations.  He was observed overnight and discharged after his thoughts dissipated. Today patient stated that he feels the new medication Austedo for tardive dyskinesia is what caused him to have the suicidal ideations.  He stopped taking it since his brief stay in hospital.  He informed that since he stopped taking that he has noticed increase in his numbers in his extremities and also his lips and mouth is having recurring tremors. For the first time I noticed patient having significant orotardive dyskinesia in addition to coarse tremors of upper bilateral extremities. Patient stated he is ready to go back to taking Ingrezza but was waiting to discuss this with me first.  I informed the patient that he could go ahead and restart Ingrezza and it seems like it was helping more than he thought.  Patient agreed with the same.  Patient stated that he has been feeling more depressed and anxious lately.  He has been thinking and recently died to going to an assisted living facility.  He stated he is given start looking and has already checked out to facilities.  He is trying to go and check out a few more early next week. Patient was asked if he made this decision for the sake of his daughter or on his own.  He stated he made this decision on his own as he feels he will do better in a environment where someone can help him.  Regarding talking to his daughter, patient stated that neither one of  them have initiated a phone call to the other person.  He has been thinking about calling her to tell her that he has changed his decision and is agreeable to go to the assisted living facility.  Patient asked for medication adjustment to help his depression and anxiety.  He has taken higher dose of Paxil 60 mg in the past but was unable to tolerate it.  Patient was asked if he remembered what it did to him he cannot recall.  He was offered BuSpar to help his anxiety.  Patient recall that he has tried that in the past as well but was not sure if it helped him much.  Patient was advised to try BuSpar to see if that helps.  He reported that Seroquel helps partially but he has began to have difficulty with sleep.  I advised the patient to speak to his daughter as that could be attributing to his underlying anxiety.  I advised that we start BuSpar 10 mg 3 times a day and restart Ingrezza.  I advised not to make too many changes at the same time and to keep the follow-up appointment on February 10 for close monitoring.  Patient verbalizes understanding.  Visit Diagnosis:    ICD-10-CM   1. MDD (major depressive disorder), recurrent, in partial remission ([place])  F33.41   2. Tardive dyskinesia  G24.01     Past Psychiatric History: depression, TD  Past Medical History:  Past Medical History:  Diagnosis Date  . Depression   . Frequent urination   . GERD (gastroesophageal reflux disease)   . Herpes   . Hyperlipidemia   . Hypertension   . Memory loss   . Obesity   . Stroke ([place])   . Urgency of urination     Past Surgical History:  Procedure Laterality Date  . disckec    . SPINAL FUSION    . VASECTOMY        Family History:  Family History  Problem Relation Age of Onset  . Hypertension Mother   . Hypertension Father   . Hypertension Sister   . Hypertension Brother   . Cancer Maternal Aunt   . Cancer - Ovarian Maternal Grandmother   . Diabetes Mellitus I Paternal Grandmother   .  Hypertension Paternal Grandfather   . Cancer Maternal Aunt     Social History:  Social History   Socioeconomic History  . Marital status: Legally Separated    Spouse name: Not on file  . Number of children: 1  . Years of education: Masters  . Highest education level: Not on file  Occupational History  . Occupation: retired    Fish farm manager: RETIRED  Tobacco Use  . Smoking status: Former Research scientist (life sciences)  . Smokeless tobacco: Never Used  Substance and Sexual Activity  . Alcohol use: No    Alcohol/week: 1.0 standard drinks    Types: 1 Cans of beer per week    Comment: socially  . Drug use: No  . Sexual activity: Never  Other Topics Concern  . Not on file  Social History Narrative   Patient is separated.   Patient lives alone and has one child.   Patient is retired.   Patient is right-handed.   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   Patient does not drink any caffeine.   Social Determinants of Health   Financial Resource Strain:   . Difficulty of Paying Living Expenses: Not on file  Food Insecurity:   . Worried About Charity fundraiser in the Last Year: Not on file  . Ran Out of Food in the Last Year: Not on file  Transportation Needs:   . Lack of Transportation (Medical): Not on file  . Lack of Transportation (Non-Medical): Not on file  Physical Activity:   . Days of Exercise per Week: Not on file  . Minutes of Exercise per Session: Not on file  Stress:   . Feeling of Stress : Not on file  Social Connections:   . Frequency of Communication with Friends and Family: Not on file  . Frequency of Social Gatherings with Friends and Family: Not on file  . Attends Religious Services: Not on file  . Active Member of Clubs or Organizations: Not on file  . Attends Archivist Meetings: Not on file  . Marital Status: Not on file    Allergies:  Allergies  Allergen Reactions  . Acyclovir And Related   . Aspirin Other (See Comments)    Dizziness, nausea and vomiting Balance  issues  . Lisinopril Other (See Comments)    Critical Cough  . Lithium Other (See Comments)    Critical Per pt reaction unknown  . Oxycodone Nausea And Vomiting  . Oxycodone-Acetaminophen Nausea And Vomiting    Also causes dizziness    Metabolic Disorder Labs: Lab Results  Component Value Date   HGBA1C 5.5 05/29/2014   MPG 120 (H) 12/20/2009   No results found for: PROLACTIN Lab Results  Component  Value Date   CHOL 150 05/29/2014   TRIG 284 (H) 05/29/2014   HDL 30 (L) 05/29/2014   CHOLHDL 5.0 05/29/2014   VLDL 49 (H) 12/20/2009   LDLCALC 63 05/29/2014   LDLCALC (H) 12/20/2009    149        Total Cholesterol/HDL:CHD Risk Coronary Heart Disease Risk Table                     Men   Women  1/2 Average Risk   3.4   3.3  Average Risk       5.0   4.4  2 X Average Risk   9.6   7.1  3 X Average Risk  23.4   11.0        Use the calculated Patient Ratio above and the CHD Risk Table to determine the patient's CHD Risk.        ATP III CLASSIFICATION (LDL):  <100     mg/dL   Optimal  100-129  mg/dL   Near or Above                    Optimal  130-159  mg/dL   Borderline  160-189  mg/dL   High  >190     mg/dL   Very High   Lab Results  Component Value Date   TSH 2.850 10/16/2013   TSH 2.460 10/31/2010    Therapeutic Level Labs: No results found for: LITHIUM No results found for: VALPROATE No components found for:  CBMZ  Current Medications: Current Outpatient Medications  Medication Sig Dispense Refill  . amLODipine (NORVASC) 10 MG tablet Take 10 mg by mouth daily.    . candesartan (ATACAND) 16 MG tablet Take 32 mg by mouth daily.     . clopidogrel (PLAVIX) 75 MG tablet Take 1 tablet (75 mg total) by mouth daily. 30 tablet 11  . Deutetrabenazine (AUSTEDO) 12 MG TABS Take 12 mg by mouth 2 (two) times daily. 60 tablet 1  . Fluocinonide 0.1 % CREA APPLY 2-3 GRAMS TO AFFECTED AREAS 3-4 TIMES DAILY (1 GRAM = 1 DIME SIZE)  3  . lovastatin (MEVACOR) 10 MG tablet Take 10  mg by mouth at bedtime.    . meloxicam (MOBIC) 7.5 MG tablet Take 7.5 mg by mouth daily.    . metoprolol succinate (TOPROL-XL) 25 MG 24 hr tablet 25 mg.     . PARoxetine (PAXIL) 40 MG tablet Take 1 tablet (40 mg total) by mouth at bedtime. 30 tablet 1  . primidone (MYSOLINE) 250 MG tablet Take 1 tablet (250 mg total) by mouth 2 (two) times daily. 60 tablet 12  . QUEtiapine (SEROQUEL) 50 MG tablet Take 1 tablet (50 mg total) by mouth at bedtime. 30 tablet 1  . valACYclovir (VALTREX) 1000 MG tablet Take 1,000 mg by mouth daily.     No current facility-administered medications for this visit.     Musculoskeletal: Strength & Muscle Tone: unable to assess due to telemed visit Gait & Station: unable to assess due to telemed visit Patient leans: unable to assess due to telemed visit  Psychiatric Specialty Exam: Review of Systems  There were no vitals taken for this visit.There is no height or weight on file to calculate BMI.  General Appearance: Fairly Groomed  Eye Contact:  Good  Speech:  Clear and Coherent and Normal Rate  Volume:  Normal  Mood:  Depressed  Affect:  Sad  Thought Process:  Goal Directed  and Descriptions of Associations: Intact  Orientation:  Full (Time, Place, and Person)  Thought Content: Logical   Suicidal Thoughts:  No  Homicidal Thoughts:  No  Memory:  Recent;   Good  Judgement:  Fair  Insight:  Fair  Psychomotor Activity: Orotardive dyskinesia and  Coarse bilateral upper extremity tremors noted  Concentration:  Concentration: Good and Attention Span: Good  Recall:  Good  Fund of Knowledge: Good  Language: Good  Akathisia:  Negative  Handed:  Right  AIMS (if indicated): Orotardive dyskinesia and coarse bilateral tremors of upper extremities noted  Assets:  Communication Skills Desire for Improvement Financial Resources/Insurance Housing  ADL's:  Intact  Cognition: Impaired,  Mild  Sleep:  Fair   Screenings: AIMS     Admission (Discharged) from OP  Visit from 02/02/2019 in [facility] Total Score  12    AUDIT     Admission (Discharged) from OP Visit from 02/02/2019 in [facility]  Alcohol Use Disorder Identification Test Final Score (AUDIT)  0    Mini-Mental     Office Visit from 09/12/2018 in [facility] Office Visit from 03/12/2018 in [facility] Office Visit from 09/26/2017 in [facility] Office Visit from 06/14/2017 in [facility] Office Visit from 12/14/2016 in [facility]  Total Score (max 30 points )  24  28  28  27  28        Assessment and Plan: Patient seen after recent hospital discharge for suicidal ideations.  Patient believes Austedo caused him to have suicidal thoughts which have now resolved since he is not on it.  He is agreeable to go back to [facility].  He reported feeling anxious and depressed.  He is willing to try buspirone 10 mg 3 times a day for anxiety.  He reported his sleep is not as good as it used to be.  He agreed to continue the Seroquel 50 mg dose for now and will adjust it if we need to at the time of his next appointment. Potential side effects of medication and risks vs benefits of treatment vs non-treatment were explained and discussed. All questions were answered.   1. MDD (major depressive disorder), recurrent, in partial remission ([facility])  - Continue PARoxetine (PAXIL) 40 MG tablet; Take 1 tablet (40 mg total) by mouth at bedtime.  Dispense: 30 tablet; Refill: 1 -Continue QUEtiapine (SEROQUEL) 50 MG tablet; Take 1 tablet (50 mg total) by mouth at bedtime.  Dispense: 30 tablet; Refill: 1 - Start Buspar 10 mg TID.  2. Tardive dyskinesia  -Resume valbenazine Tosylate (INGREZZA) 80 MG CAPS; Take 80 mg by mouth daily.  Dispense: 30 capsule; Refill: 1  Continue follow-up with neurology. Follow-up in 3 weeks for close monitoring.  [place] Crane, MD 02/07/2019, 2:14  PM

## 2019-02-13 DIAGNOSIS — F332 Major depressive disorder, recurrent severe without psychotic features: Secondary | ICD-10-CM | POA: Diagnosis not present

## 2019-02-20 ENCOUNTER — Telehealth: Payer: Self-pay

## 2019-02-20 NOTE — Telephone Encounter (Signed)
Patient called and stated that he has TD but what he's noticing is that when he's calm he doesn't shake but when he's nervous he shakes more excessively. He would like to know what he could do to help with that. Please review and advise. Thank you.

## 2019-02-20 NOTE — Telephone Encounter (Signed)
Please reassure him that I understand his concern. I will be happy to discuss regarding this with him at the time of his next appointment with me in a week from now on 02/27/2019. Until then try to avoid unnecessary stress.  Remind him of his next appt. Thanks.

## 2019-02-22 DIAGNOSIS — Z23 Encounter for immunization: Secondary | ICD-10-CM | POA: Diagnosis not present

## 2019-02-27 ENCOUNTER — Other Ambulatory Visit: Payer: Self-pay

## 2019-02-27 ENCOUNTER — Ambulatory Visit (INDEPENDENT_AMBULATORY_CARE_PROVIDER_SITE_OTHER): Payer: Medicare Other | Admitting: Psychiatry

## 2019-02-27 ENCOUNTER — Telehealth: Payer: Self-pay | Admitting: Adult Health

## 2019-02-27 ENCOUNTER — Encounter: Payer: Self-pay | Admitting: Psychiatry

## 2019-02-27 DIAGNOSIS — F419 Anxiety disorder, unspecified: Secondary | ICD-10-CM

## 2019-02-27 DIAGNOSIS — F332 Major depressive disorder, recurrent severe without psychotic features: Secondary | ICD-10-CM | POA: Diagnosis not present

## 2019-02-27 DIAGNOSIS — G2401 Drug induced subacute dyskinesia: Secondary | ICD-10-CM

## 2019-02-27 DIAGNOSIS — F3341 Major depressive disorder, recurrent, in partial remission: Secondary | ICD-10-CM

## 2019-02-27 MED ORDER — BUSPIRONE HCL 10 MG PO TABS: 10.0000 mg | ORAL_TABLET | Freq: Three times a day (TID) | ORAL | 1 refills | Status: DC

## 2019-02-27 MED ORDER — INGREZZA 80 MG PO CAPS: 80.0000 mg | ORAL_CAPSULE | Freq: Every day | ORAL | 1 refills | Status: DC

## 2019-02-27 MED ORDER — QUETIAPINE FUMARATE 50 MG PO TABS: 50.0000 mg | ORAL_TABLET | Freq: Every day | ORAL | 1 refills | Status: DC

## 2019-02-27 MED ORDER — PAROXETINE HCL ER 37.5 MG PO TB24: 37.5000 mg | ORAL_TABLET | Freq: Every day | ORAL | 1 refills | Status: DC

## 2019-02-27 NOTE — Telephone Encounter (Signed)
I called patient and LVM to reschedule 2/25 appointment due to NP out of office. Requested patient call back to reschedule.

## 2019-02-27 NOTE — Progress Notes (Signed)
[facility] MD OP Progress Note  I connected with  [name] on 02/27/19 by a video enabled telemedicine application and verified that I am speaking with the correct person using two identifiers.   I discussed the limitations of evaluation and management by telemedicine. The patient expressed understanding and agreed to proceed.    02/27/2019 10:26 AM [name]  MRN:  [id number]  Chief Complaint: " I am moving to an independent living facility this weekend."  HPI: Patient reported that he has made the decision to move into an independent living facility this weekend.  He informed that he spoke to his daughter about this and informed her.  He stated that his daughter and ex-wife are helping him packing and moving this weekend.  He stated that the whole idea of him moving to this facility is so that he can interact with others and is not isolating like he is at home.  However he is concerned that he may move into an apartment in the facility and then just stay closed in his apartment.  He stated that his main concern is that most of the residents of the facility are above 66 years of age and everything is designed based on them.  He knows that there are staff members that engage the residents and he is concerned that there may be nothing that can keep him engaged as everything will be designed for above 90 year old residents.  He stated that he is putting up his house for sale on the market as he cannot afford to pay the house mortgage and also the rent for department in the facility.  He stated that he did not want to move back to her apartment after living in a house for 30 years.  He is concerned about not liking the facility. He stated that he is still depressed and his mood is not better.  His tremors have improved after being back on Ingrezza and his anxiety is maybe a little better.  Writer advised the patient to be open minded and try the facility and maybe hold off son's house for couple of  months.  He stated that he possibly can do that however he knows that as well as his house on the market it was helpful to quickly.  Patient is doing a bit dilemma about whether he is making the right decision to move into an assisted living facility or not.  Provider asked the patient for permission to speak to his daughter regarding so situation.  He gave verbal permission for the writer to contact his daughter.  Much supportive psychotherapy was provided to the patient and he was advised to do a trial of Paxil CR for extended release version.  He was unable to tolerate higher dose of Paxil in the past so would switch to extended release version and see how he does on that.  I contacted his daughter Ms. Gretchen Portela after session was done.  She informed that everything has been his decision and she is trying to just be there to support him.  She stated that in the past helping him caused a lot of mental and physical health issues for her.  She stated that she hopes that he understands that he may be younger than the residents however his level of functioning is way over his age.  She stated that she has not personally look into this facility and everything has been set up by him personally.  She stated that she agrees  with the idea of trying for 2 months and then making the decision to sell the house.  However she is not aware of his exact financial situation and also she is not sure if 2 months would be enough for him to understand how he is liking the place.  She also verbalized that many of his friends have been feeding him the notion that he will be confined to his apartment once he moves to the living facility.  She stated that she is not sure why he keeps listening to his friends and how they keep giving him negative advice.   Visit Diagnosis:    ICD-10-CM   1. MDD (major depressive disorder), recurrent, in partial remission (HCC)  F33.41 PARoxetine (PAXIL CR) 37.5 MG 24 hr tablet     QUEtiapine (SEROQUEL) 50 MG tablet  2. Tardive dyskinesia  G24.01 Valbenazine Tosylate (INGREZZA) 80 MG CAPS  3. Anxiety  F41.9 busPIRone (BUSPAR) 10 MG tablet    Past Psychiatric History: MDD, TD  Past Medical History:  Past Medical History:  Diagnosis Date  . Depression   . Frequent urination   . GERD (gastroesophageal reflux disease)   . Herpes   . Hyperlipidemia   . Hypertension   . Memory loss   . Obesity   . Stroke ([place])   . Urgency of urination     Past Surgical History:  Procedure Laterality Date  . disckec    . SPINAL FUSION    . VASECTOMY        Family History:  Family History  Problem Relation Age of Onset  . Hypertension Mother   . Hypertension Father   . Hypertension Sister   . Hypertension Brother   . Cancer Maternal Aunt   . Cancer - Ovarian Maternal Grandmother   . Diabetes Mellitus I Paternal Grandmother   . Hypertension Paternal Grandfather   . Cancer Maternal Aunt     Social History:  Social History   Socioeconomic History  . Marital status: Legally Separated    Spouse name: Not on file  . Number of children: 1  . Years of education: Masters  . Highest education level: Not on file  Occupational History  . Occupation: retired    Fish farm manager: RETIRED  Tobacco Use  . Smoking status: Former Research scientist (life sciences)  . Smokeless tobacco: Never Used  Substance and Sexual Activity  . Alcohol use: No    Alcohol/week: 1.0 standard drinks    Types: 1 Cans of beer per week    Comment: socially  . Drug use: No  . Sexual activity: Never  Other Topics Concern  . Not on file  Social History Narrative   Patient is separated.   Patient lives alone and has one child.   Patient is retired.   Patient is right-handed.   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   Patient does not drink any caffeine.   Social Determinants of Health   Financial Resource Strain:   . Difficulty of Paying Living Expenses: Not on file  Food Insecurity:   . Worried About Sales executive in the Last Year: Not on file  . Ran Out of Food in the Last Year: Not on file  Transportation Needs:   . Lack of Transportation (Medical): Not on file  . Lack of Transportation (Non-Medical): Not on file  Physical Activity:   . Days of Exercise per Week: Not on file  . Minutes of Exercise per Session: Not on file  Stress:   .  Feeling of Stress : Not on file  Social Connections:   . Frequency of Communication with Friends and Family: Not on file  . Frequency of Social Gatherings with Friends and Family: Not on file  . Attends Religious Services: Not on file  . Active Member of Clubs or Organizations: Not on file  . Attends Archivist Meetings: Not on file  . Marital Status: Not on file    Allergies:  Allergies  Allergen Reactions  . Acyclovir And Related   . Aspirin Other (See Comments)    Dizziness, nausea and vomiting Balance issues  . Lisinopril Other (See Comments)    Critical Cough  . Lithium Other (See Comments)    Critical Per pt reaction unknown  . Oxycodone Nausea And Vomiting  . Oxycodone-Acetaminophen Nausea And Vomiting    Also causes dizziness    Metabolic Disorder Labs: Lab Results  Component Value Date   HGBA1C 5.5 05/29/2014   MPG 120 (H) 12/20/2009   No results found for: PROLACTIN Lab Results  Component Value Date   CHOL 150 05/29/2014   TRIG 284 (H) 05/29/2014   HDL 30 (L) 05/29/2014   CHOLHDL 5.0 05/29/2014   VLDL 49 (H) 12/20/2009   LDLCALC 63 05/29/2014   LDLCALC (H) 12/20/2009    149        Total Cholesterol/HDL:CHD Risk Coronary Heart Disease Risk Table                     Men   Women  1/2 Average Risk   3.4   3.3  Average Risk       5.0   4.4  2 X Average Risk   9.6   7.1  3 X Average Risk  23.4   11.0        Use the calculated Patient Ratio above and the CHD Risk Table to determine the patient's CHD Risk.        ATP III CLASSIFICATION (LDL):  <100     mg/dL   Optimal  100-129  mg/dL   Near or Above                     Optimal  130-159  mg/dL   Borderline  160-189  mg/dL   High  >190     mg/dL   Very High   Lab Results  Component Value Date   TSH 2.850 10/16/2013   TSH 2.460 10/31/2010    Therapeutic Level Labs: No results found for: LITHIUM No results found for: VALPROATE No components found for:  CBMZ  Current Medications: Current Outpatient Medications  Medication Sig Dispense Refill  . amLODipine (NORVASC) 10 MG tablet Take 10 mg by mouth daily.    . busPIRone (BUSPAR) 10 MG tablet Take 1 tablet (10 mg total) by mouth 3 (three) times daily. 90 tablet 1  . candesartan (ATACAND) 16 MG tablet Take 32 mg by mouth daily.     . clopidogrel (PLAVIX) 75 MG tablet Take 1 tablet (75 mg total) by mouth daily. 30 tablet 11  . Fluocinonide 0.1 % CREA APPLY 2-3 GRAMS TO AFFECTED AREAS 3-4 TIMES DAILY (1 GRAM = 1 DIME SIZE)  3  . lovastatin (MEVACOR) 10 MG tablet Take 10 mg by mouth at bedtime.    . meloxicam (MOBIC) 7.5 MG tablet Take 7.5 mg by mouth daily.    . metoprolol succinate (TOPROL-XL) 25 MG 24 hr tablet 25 mg.     Marland Kitchen  PARoxetine (PAXIL CR) 37.5 MG 24 hr tablet Take 1 tablet (37.5 mg total) by mouth at bedtime. 30 tablet 1  . primidone (MYSOLINE) 250 MG tablet Take 1 tablet (250 mg total) by mouth 2 (two) times daily. 60 tablet 12  . QUEtiapine (SEROQUEL) 50 MG tablet Take 1 tablet (50 mg total) by mouth at bedtime. 30 tablet 1  . valACYclovir (VALTREX) 1000 MG tablet Take 1,000 mg by mouth daily.    Minus Liberty Tosylate (INGREZZA) 80 MG CAPS Take 80 mg by mouth daily. 30 capsule 1   No current facility-administered medications for this visit.     Musculoskeletal: Strength & Muscle Tone: unable to assess due to telemed visit Gait & Station: unable to assess due to telemed visit Patient leans: unable to assess due to telemed visit  Psychiatric Specialty Exam: Review of Systems  There were no vitals taken for this visit.There is no height or weight on file to calculate BMI.   General Appearance: Fairly Groomed  Eye Contact:  Good  Speech:  Clear and Coherent and Normal Rate  Volume:  Normal  Mood:  Depressed  Affect:  Sad  Thought Process:  Goal Directed and Descriptions of Associations: Intact  Orientation:  Full (Time, Place, and Person)  Thought Content: Logical   Suicidal Thoughts:  No  Homicidal Thoughts:  No  Memory:  Recent;   Good  Judgement:  Fair  Insight:  Fair  Psychomotor Activity: Orotardive dyskinesia and  Coarse bilateral upper extremity tremors noted  Concentration:  Concentration: Good and Attention Span: Good  Recall:  Good  Fund of Knowledge: Good  Language: Good  Akathisia:  Negative  Handed:  Right  AIMS (if indicated): Orotardive dyskinesia and coarse bilateral tremors of upper extremities noted  Assets:  Communication Skills Desire for Improvement Financial Resources/Insurance Housing  ADL's:  Intact  Cognition: Impaired,  Mild  Sleep:  Fair   Screenings: AIMS     Admission (Discharged) from OP Visit from 02/02/2019 in [facility] Total Score  12    AUDIT     Admission (Discharged) from OP Visit from 02/02/2019 in [facility]  Alcohol Use Disorder Identification Test Final Score (AUDIT)  0    Mini-Mental     Office Visit from 09/12/2018 in [facility] Office Visit from 03/12/2018 in [facility] Office Visit from 09/26/2017 in [facility] Office Visit from 06/14/2017 in [facility] Office Visit from 12/14/2016 in [facility]  Total Score (max 30 points )  24  28  28  27  28        Assessment and Plan: Patient   1. MDD (major depressive disorder), recurrent, in partial remission (HCC) - Discontinue Paxil 40 mg and switch to Paxil CR 37.5 mf HS. - Switch to PARoxetine (PAXIL CR) 37.5 MG 24 hr tablet; Take 1 tablet (37.5 mg total) by mouth at bedtime.  Dispense: 30 tablet;  Refill: 1 - Continue QUEtiapine (SEROQUEL) 50 MG tablet; Take 1 tablet (50 mg total) by mouth at bedtime.  Dispense: 30 tablet; Refill: 1  2. Tardive dyskinesia  - Continue Valbenazine Tosylate (INGREZZA) 80 MG CAPS; Take 80 mg by mouth daily.  Dispense: 30 capsule; Refill: 1  3. Anxiety  - Continue busPIRone (BUSPAR) 10 MG tablet; Take 1 tablet (10 mg total) by mouth 3 (three) times daily.  Dispense: 90 tablet; Refill: 1   Continue follow-up with neurology. He has resumed individual therapy recently.  Follow-up in 3 weeks for close monitoring.  Nevada Crane, MD 02/27/2019, 10:26 AM

## 2019-03-06 ENCOUNTER — Telehealth: Payer: Self-pay

## 2019-03-06 NOTE — Telephone Encounter (Signed)
pt called left message that he is having some shaking and he beieve it is the  ingrezza

## 2019-03-06 NOTE — Telephone Encounter (Signed)
I called and spoke with the patient on the phone. He stated that he felt that Ingrezza was making him shake real bad so he stopped taking it.  Writer reminded him that in the recent past when he stopped taking the Ingrezza his tremors had worsened so maybe it was not a good idea for him to discontinue Ingrezza. However, patient replied that his tremors have now improved so now he does not want to take it anymore.  Writer informed him that if that is the case that he can temporarily discontinue Ingrezza however if his tremors return back then he needs to restart the medication.  Patient verbalized his understanding.  Writer reminded him of his follow up appointment on March 3rd.

## 2019-03-06 NOTE — Telephone Encounter (Signed)
Dr.Kaur's patient.

## 2019-03-13 DIAGNOSIS — F332 Major depressive disorder, recurrent severe without psychotic features: Secondary | ICD-10-CM | POA: Diagnosis not present

## 2019-03-14 ENCOUNTER — Ambulatory Visit: Payer: Medicare Other | Admitting: Adult Health

## 2019-03-20 ENCOUNTER — Other Ambulatory Visit: Payer: Self-pay

## 2019-03-20 ENCOUNTER — Encounter: Payer: Self-pay | Admitting: Psychiatry

## 2019-03-20 ENCOUNTER — Ambulatory Visit (INDEPENDENT_AMBULATORY_CARE_PROVIDER_SITE_OTHER): Payer: Medicare Other | Admitting: Psychiatry

## 2019-03-20 DIAGNOSIS — F419 Anxiety disorder, unspecified: Secondary | ICD-10-CM | POA: Diagnosis not present

## 2019-03-20 DIAGNOSIS — F3341 Major depressive disorder, recurrent, in partial remission: Secondary | ICD-10-CM

## 2019-03-20 DIAGNOSIS — G2401 Drug induced subacute dyskinesia: Secondary | ICD-10-CM | POA: Diagnosis not present

## 2019-03-20 NOTE — Progress Notes (Signed)
[facility] MD OP Progress Note  I connected with  [name] on 03/20/19 by a video enabled telemedicine application and verified that I am speaking with the correct person using two identifiers.   I discussed the limitations of evaluation and management by telemedicine. The patient expressed understanding and agreed to proceed.    03/20/2019 2:04 PM [name]  MRN:  [id number]  Chief Complaint: " I am not liking it here."  HPI: Patient reported that he he recently moved to an assisted living facility and he is not liking it here.  Patient was asked what order things that he did not like, he replied that since he is not 69 years old he can tell all the activities are designed according to elderly frail patients.  He complained that everybody goes to bed at 8 PM here which is too early for him.  He stated that he has not been able to make any friends here so far and is hoping he can make some acquaintances.   Patient was reminded that one of the biggest reasons why he moved into this facility was the opportunity for interaction with others.  He stated that he is trying to make the best of it.  He spoke about how he played bingo yesterday and how the staff members use his life story to inspire others.  He stated that he does not have much options left as his house has been cleaned out.  He stated that he gave away a lot of his stuff and is not sure if he can ever return back to living on his own. He stated that he wonders how his ex-wife introduces him to others and wonders if she is telling others that her ex-husband is now living in facility for all people.  Patient was asked why he cares about what she has to say and he said he is not sure why he thinks about it. Regarding him discontinuing Ingrezza, patient stated that he has noticed that is hand tremors have improved so he does not think he needs it anymore.   Regarding him being switched to Paxil CR, he said he has noticed that it has helped  his mood as he feels less depressed now. He has been taking buspirone 10 mg 3 times a day regularly as he has noticed that when he misses a dose he feels very anxious. He stated he does not have any active suicidal ideations however passive suicidal thoughts have crossed his mind.  He stated he does want to stay busy so that he does not have any active thoughts of hurting himself.   Visit Diagnosis:    ICD-10-CM   1. MDD (major depressive disorder), recurrent, in partial remission ([place])  F33.41   2. Tardive dyskinesia  G24.01   3. Anxiety  F41.9     Past Psychiatric History: MDD, TD  Past Medical History:  Past Medical History:  Diagnosis Date  . Depression   . Frequent urination   . GERD (gastroesophageal reflux disease)   . Herpes   . Hyperlipidemia   . Hypertension   . Memory loss   . Obesity   . Stroke ([place])   . Urgency of urination     Past Surgical History:  Procedure Laterality Date  . disckec    . SPINAL FUSION    . VASECTOMY        Family History:  Family History  Problem Relation Age of Onset  . Hypertension Mother   .  Hypertension Father   . Hypertension Sister   . Hypertension Brother   . Cancer Maternal Aunt   . Cancer - Ovarian Maternal Grandmother   . Diabetes Mellitus I Paternal Grandmother   . Hypertension Paternal Grandfather   . Cancer Maternal Aunt     Social History:  Social History   Socioeconomic History  . Marital status: Legally Separated    Spouse name: Not on file  . Number of children: 1  . Years of education: Masters  . Highest education level: Not on file  Occupational History  . Occupation: retired    Fish farm manager: RETIRED  Tobacco Use  . Smoking status: Former Research scientist (life sciences)  . Smokeless tobacco: Never Used  Substance and Sexual Activity  . Alcohol use: No    Alcohol/week: 1.0 standard drinks    Types: 1 Cans of beer per week    Comment: socially  . Drug use: No  . Sexual activity: Never  Other Topics Concern  . Not on file   Social History Narrative   Patient is separated.   Patient lives alone and has one child.   Patient is retired.   Patient is right-handed.   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   Patient does not drink any caffeine.   Social Determinants of Health   Financial Resource Strain:   . Difficulty of Paying Living Expenses: Not on file  Food Insecurity:   . Worried About Charity fundraiser in the Last Year: Not on file  . Ran Out of Food in the Last Year: Not on file  Transportation Needs:   . Lack of Transportation (Medical): Not on file  . Lack of Transportation (Non-Medical): Not on file  Physical Activity:   . Days of Exercise per Week: Not on file  . Minutes of Exercise per Session: Not on file  Stress:   . Feeling of Stress : Not on file  Social Connections:   . Frequency of Communication with Friends and Family: Not on file  . Frequency of Social Gatherings with Friends and Family: Not on file  . Attends Religious Services: Not on file  . Active Member of Clubs or Organizations: Not on file  . Attends Archivist Meetings: Not on file  . Marital Status: Not on file    Allergies:  Allergies  Allergen Reactions  . Acyclovir And Related   . Aspirin Other (See Comments)    Dizziness, nausea and vomiting Balance issues  . Lisinopril Other (See Comments)    Critical Cough  . Lithium Other (See Comments)    Critical Per pt reaction unknown  . Oxycodone Nausea And Vomiting  . Oxycodone-Acetaminophen Nausea And Vomiting    Also causes dizziness    Metabolic Disorder Labs: Lab Results  Component Value Date   HGBA1C 5.5 05/29/2014   MPG 120 (H) 12/20/2009   No results found for: PROLACTIN Lab Results  Component Value Date   CHOL 150 05/29/2014   TRIG 284 (H) 05/29/2014   HDL 30 (L) 05/29/2014   CHOLHDL 5.0 05/29/2014   VLDL 49 (H) 12/20/2009   LDLCALC 63 05/29/2014   LDLCALC (H) 12/20/2009    149        Total Cholesterol/HDL:CHD Risk Coronary Heart  Disease Risk Table                     Men   Women  1/2 Average Risk   3.4   3.3  Average Risk  5.0   4.4  2 X Average Risk   9.6   7.1  3 X Average Risk  23.4   11.0        Use the calculated Patient Ratio above and the CHD Risk Table to determine the patient's CHD Risk.        ATP III CLASSIFICATION (LDL):  <100     mg/dL   Optimal  100-129  mg/dL   Near or Above                    Optimal  130-159  mg/dL   Borderline  160-189  mg/dL   High  >190     mg/dL   Very High   Lab Results  Component Value Date   TSH 2.850 10/16/2013   TSH 2.460 10/31/2010    Therapeutic Level Labs: No results found for: LITHIUM No results found for: VALPROATE No components found for:  CBMZ  Current Medications: Current Outpatient Medications  Medication Sig Dispense Refill  . amLODipine (NORVASC) 10 MG tablet Take 10 mg by mouth daily.    . busPIRone (BUSPAR) 10 MG tablet Take 1 tablet (10 mg total) by mouth 3 (three) times daily. 90 tablet 1  . candesartan (ATACAND) 16 MG tablet Take 32 mg by mouth daily.     . clopidogrel (PLAVIX) 75 MG tablet Take 1 tablet (75 mg total) by mouth daily. 30 tablet 11  . Fluocinonide 0.1 % CREA APPLY 2-3 GRAMS TO AFFECTED AREAS 3-4 TIMES DAILY (1 GRAM = 1 DIME SIZE)  3  . lovastatin (MEVACOR) 10 MG tablet Take 10 mg by mouth at bedtime.    . meloxicam (MOBIC) 7.5 MG tablet Take 7.5 mg by mouth daily.    . metoprolol succinate (TOPROL-XL) 25 MG 24 hr tablet 25 mg.     . PARoxetine (PAXIL CR) 37.5 MG 24 hr tablet Take 1 tablet (37.5 mg total) by mouth at bedtime. 30 tablet 1  . primidone (MYSOLINE) 250 MG tablet Take 1 tablet (250 mg total) by mouth 2 (two) times daily. 60 tablet 12  . QUEtiapine (SEROQUEL) 50 MG tablet Take 1 tablet (50 mg total) by mouth at bedtime. 30 tablet 1  . valACYclovir (VALTREX) 1000 MG tablet Take 1,000 mg by mouth daily.    Minus Liberty Tosylate (INGREZZA) 80 MG CAPS Take 80 mg by mouth daily. 30 capsule 1   No current  facility-administered medications for this visit.     Musculoskeletal: Strength & Muscle Tone: unable to assess due to telemed visit Gait & Station: unable to assess due to telemed visit Patient leans: unable to assess due to telemed visit  Psychiatric Specialty Exam: Review of Systems  There were no vitals taken for this visit.There is no height or weight on file to calculate BMI.  General Appearance: Fairly Groomed  Eye Contact:  Good  Speech:  Clear and Coherent and Normal Rate  Volume:  Normal  Mood:  Depressed  Affect:  Sad  Thought Process:  Goal Directed and Descriptions of Associations: Intact  Orientation:  Full (Time, Place, and Person)  Thought Content: Logical   Suicidal Thoughts:  No  Homicidal Thoughts:  No  Memory:  Recent;   Good  Judgement:  Fair  Insight:  Fair  Psychomotor Activity: Orotardive dyskinesia noted  Concentration:  Concentration: Good and Attention Span: Good  Recall:  Good  Fund of Knowledge: Good  Language: Good  Akathisia:  Negative  Handed:  Right  AIMS (if indicated): Orotardive dyskinesia and coarse bilateral tremors of upper extremities noted  Assets:  Communication Skills Desire for Improvement Financial Resources/Insurance Housing  ADL's:  Intact  Cognition: Impaired,  Mild  Sleep:  Fair   Screenings: AIMS     Admission (Discharged) from OP Visit from 02/02/2019 in [facility] Total Score  12    AUDIT     Admission (Discharged) from OP Visit from 02/02/2019 in [facility]  Alcohol Use Disorder Identification Test Final Score (AUDIT)  0    Mini-Mental     Office Visit from 09/12/2018 in [facility] Office Visit from 03/12/2018 in [facility] Office Visit from 09/26/2017 in [facility] Office Visit from 06/14/2017 in [facility] Office Visit from 12/14/2016 in [facility]  Total Score  (max 30 points )  24  28  28  27  28        Assessment and Plan: Patient   1. MDD (major depressive disorder), recurrent, in partial remission (HCC)  - Continue PARoxetine (PAXIL CR) 37.5 MG 24 hr tablet; Take 1 tablet (37.5 mg total) by mouth at bedtime.  Dispense: 30 tablet; Refill: 1 - Continue QUEtiapine (SEROQUEL) 50 MG tablet; Take 1 tablet (50 mg total) by mouth at bedtime.  Dispense: 30 tablet; Refill: 1  2. Tardive dyskinesia  - Pt has stopped taking Valbenazine Tosylate (INGREZZA) 80 MG CAPS; Take 80 mg by mouth daily.  Dispense: 30 capsule; Refill: 1. However, pt was advised to restart it if he changes his mind.  3. Anxiety  - Continue busPIRone (BUSPAR) 10 MG tablet; Take 1 tablet (10 mg total) by mouth 3 (three) times daily.  Dispense: 90 tablet; Refill: 1   Continue follow-up with neurology. Continue same regimen. Continue individual therapy. Follow-up in 4 weeks.  [place] Crane, MD 03/20/2019, 2:04 PM

## 2019-03-22 DIAGNOSIS — Z23 Encounter for immunization: Secondary | ICD-10-CM | POA: Diagnosis not present

## 2019-03-25 ENCOUNTER — Ambulatory Visit (INDEPENDENT_AMBULATORY_CARE_PROVIDER_SITE_OTHER): Payer: Medicare Other | Admitting: Adult Health

## 2019-03-25 ENCOUNTER — Encounter: Payer: Self-pay | Admitting: Adult Health

## 2019-03-25 ENCOUNTER — Other Ambulatory Visit: Payer: Self-pay

## 2019-03-25 VITALS — BP 121/77 | HR 70 | Temp 96.5°F | Ht 67.0 in | Wt 170.6 lb

## 2019-03-25 DIAGNOSIS — G2401 Drug induced subacute dyskinesia: Secondary | ICD-10-CM | POA: Diagnosis not present

## 2019-03-25 DIAGNOSIS — Z8673 Personal history of transient ischemic attack (TIA), and cerebral infarction without residual deficits: Secondary | ICD-10-CM | POA: Diagnosis not present

## 2019-03-25 DIAGNOSIS — R4189 Other symptoms and signs involving cognitive functions and awareness: Secondary | ICD-10-CM

## 2019-03-25 NOTE — Progress Notes (Signed)
Of for of  [facility]  PATIENT: [name] DOB: August 12, 1950   REASON FOR VISIT: Follow-up for  tremor, memory loss, history of stroke HISTORY FROM: Patient    Chief Complaint  Patient presents with  . Follow-up    6 mon f/u. Alone. Rm 9. Patient mentioned that he has been having tremors in his hands. Its worse in the right hand.       HISTORY OF PRESENT ILLNESS   [name] is a 69 year old male who is being seen today, 03/25/2019, for follow-up regarding tremors and cognitive impairment.  Since prior visit, he recently moved into an independent living facility which she has had difficulty adjusting to.  He did have hospitalization in 01/2019 due to suicidal ideation with patient believing suicidal thoughts were due to [facility] which were initiated by psychiatry to help with tardive dyskinesia.  Restarted on Ingrezza as well as initiating BuSpar and switching to Paxil CR by psychiatry for MDD, anxiety and tardive dyskinesia.  Per review of psychiatry notes, patient stopped taking Ingrezza due to improvement of tremors.  He states that he has noticed worsening of his tremors recently and is considering restarting Ingrezza.  He does continue on primidone 250 mg twice daily.  He does report great improvement of anxiety with initiation of BuSpar.  He has scheduled follow-up visit with psychiatry at the end of this month.  In regards to cognitive impairment, daughter voiced concerns at prior visits as well as previously evaluated but unable to further evaluate at recent visits due to length of visit for tardive dyskinesia concerns.  MMSE today 24/30.  He believes memory has been stable.  Daughter did not accompany patient at today's visit.  Continues on Plavix and lovastatin for secondary stroke prevention without side effects.  He also voices concerns regarding sweating behind his legs present upon awakening and subside around 6:30pm as well as consistent cold feeling.  No  further concerns at this time.     History provided for reference purposes only Update 12/20/2018: [name] is a 69 year old male who is being seen today for management regarding ongoing tremors secondary to tardiative dyskinsesia as well as ongoing cognitive impairment.  There are prior visits, discussion regarding use of primidone occasionally and was not taking daily.  Prior visit, primidone increased to twice daily usage of 250 mg per dose.  He recently had visit with psychiatrist who recommended increasing Ingrezza and initiating Seroquel to help with ongoing tremors secondary to tardive dyskinesia.  He was also initiated on Seroquel with continuing on Paxil and discontinuing mirtazapine due to ongoing depression management.  Per daughter, recent medication changes by psychiatry which is initiated on Sunday as he receives medications through pill packs.  Daughter has noticed slight improvement since initiating medications.  He continues to have memory and cognitive difficulties and daughter believes slowly declining.  He continues to drive despite prior recommendations and daughter concerned regarding ongoing safety regarding continue driving and living independently. He continues to have difficulty with delayed/difficulty processing information, not remembering certain dates such as doctor appointments and unable to remember recent conversations.  Daughter brings in multiple old medication bottles showing patient not adequately taking medications appropriately in the past likely due to memory/cognitive impairment hence the reason for pill packs at this time.  He did undergo neuropsych evaluation in 2018, felt as though variable effort to engage in cognitive testing virtual abilities thought to be at least level reported and deficits cannot be assumed to be genuine.  Results were globally below expectation based on estimated premorbid abilities and resulting consistent with prior cognitive screening  during office visit with Dr. Leonie Leon with MMSE 28/30 suggesting that he is not experiencing gross or severe neurocognitive dysfunction and potentially psychiatric disorder potentially playing a role cognitive complaints.  Update 10/03/2018: [name] is being seen today per patient request due to worsening of tremors.  Patient initially presented alone he states he increase primidone dose to 250 mg twice daily which he took for 1.5 weeks but discontinued 2 days ago as he did not have any improvement.  He also endorses not currently being followed by psychiatry and is in the process of looking for a new psychiatrist.  He denies any changes in mood or increased depression.  He does endorse ongoing compliance with psychiatric medications.  Daughter then presented to visit who was unaware of patient discontinuing primidone and after further investigating, he was actually only taking primidone for 4 days prior to discontinuing.  Daughter does endorse patient stating worsening depression with decreased appetite, decreased energy and history of worsening depression with season changes.  He does have follow-up scheduled with his current psychiatrist on 10/15/2018 but is currently in the process of finding a new psychiatrist as he will not be continuing to accept Medicare insurance.  Daughter is currently in the process of finding a retirement/independent/assisted living facility for patient as recommended by his psychiatrist due to slowly worsening of memory/cognition.  He currently lives alone.  Daughter is in the process of changing pharmacies to obtain pill packs to assist patient with medication compliance.   Update 09/12/2018: [name] is a 69 year old male with prior history of stroke 2011, HTN, HLD, MCI and tremors who is being seen today for 39-month follow-up unaccompanied.  He has been being followed in this office since 2015 with Dr. Leonie Leon for ongoing tardive dyskinesia and cognitive impairment.  He continues on  primidone 250 mg daily with only mild tremors that do not interfere with daily activity.  He is questioning potential need of increasing dose due to residual tremors.  He endorses occasional increased anxiety for which she will take Valium for but overall anxiety and depression stable with ongoing follow-up by psychiatry.  MMSE slightly worse today than prior visit at 24/30 (prior 28/30) but subjectively feels as though his memory has been stable.  As daughter was unable to accompany patient at today's visit, she called yesterday to inform us that his current psychiatrist recommended patient to be placed in a retirement facility due to mental state.  Patient did attempt to call his daughter at beginning of visit but unable to reach.  He does feel as though he has recently been feeling "brain fog" but is able to function without difficulty.  Unable to determine if recent medication changes have been made through epic by psychiatry but patient denies recent changes.  Denies recurrent stroke/TIA symptoms.  Continues on clopidogrel and omega-3 without side effects for secondary stroke prevention.  No further concerns at this time.        REVIEW OF SYSTEMS: Full 14 system review of systems performed and notable only for those listed, all others are neg: Memory loss, tremors, depression, anxiety, sweating, cold and all other systems negative      ALLERGIES: Allergies  Allergen Reactions  . Acyclovir And Related   . Aspirin Other (See Comments)    Dizziness, nausea and vomiting Balance issues  . Lisinopril Other (See Comments)    Critical Cough  .  Lithium Other (See Comments)    Critical Per pt reaction unknown  . Oxycodone Nausea And Vomiting  . Oxycodone-Acetaminophen Nausea And Vomiting    Also causes dizziness    HOME MEDICATIONS: Outpatient Medications Prior to Visit  Medication Sig Dispense Refill  . amLODipine (NORVASC) 10 MG tablet Take 10 mg by mouth daily.    . busPIRone (BUSPAR)  10 MG tablet Take 1 tablet (10 mg total) by mouth 3 (three) times daily. 90 tablet 1  . candesartan (ATACAND) 16 MG tablet Take 32 mg by mouth daily.     . clopidogrel (PLAVIX) 75 MG tablet Take 1 tablet (75 mg total) by mouth daily. 30 tablet 11  . Fluocinonide 0.1 % CREA APPLY 2-3 GRAMS TO AFFECTED AREAS 3-4 TIMES DAILY (1 GRAM = 1 DIME SIZE)  3  . lovastatin (MEVACOR) 10 MG tablet Take 10 mg by mouth at bedtime.    . meloxicam (MOBIC) 7.5 MG tablet Take 7.5 mg by mouth daily.    . metoprolol succinate (TOPROL-XL) 25 MG 24 hr tablet 25 mg.     . PARoxetine (PAXIL CR) 37.5 MG 24 hr tablet Take 1 tablet (37.5 mg total) by mouth at bedtime. 30 tablet 1  . primidone (MYSOLINE) 250 MG tablet Take 1 tablet (250 mg total) by mouth 2 (two) times daily. 60 tablet 12  . QUEtiapine (SEROQUEL) 50 MG tablet Take 1 tablet (50 mg total) by mouth at bedtime. 30 tablet 1  . valACYclovir (VALTREX) 1000 MG tablet Take 1,000 mg by mouth daily.    Minus Liberty Tosylate (INGREZZA) 80 MG CAPS Take 80 mg by mouth daily. 30 capsule 1   No facility-administered medications prior to visit.    PAST MEDICAL HISTORY: Past Medical History:  Diagnosis Date  . Depression   . Frequent urination   . GERD (gastroesophageal reflux disease)   . Herpes   . Hyperlipidemia   . Hypertension   . Memory loss   . Obesity   . Stroke ([place])   . Urgency of urination     PAST SURGICAL HISTORY: Past Surgical History:  Procedure Laterality Date  . disckec    . SPINAL FUSION    . VASECTOMY      FAMILY HISTORY: Family History  Problem Relation Age of Onset  . Hypertension Mother   . Hypertension Father   . Hypertension Sister   . Hypertension Brother   . Cancer Maternal Aunt   . Cancer - Ovarian Maternal Grandmother   . Diabetes Mellitus I Paternal Grandmother   . Hypertension Paternal Grandfather   . Cancer Maternal Aunt     SOCIAL HISTORY: Social History   Socioeconomic History  . Marital status: Legally  Separated    Spouse name: Not on file  . Number of children: 1  . Years of education: Masters  . Highest education level: Not on file  Occupational History  . Occupation: retired    Fish farm manager: RETIRED  Tobacco Use  . Smoking status: Former Research scientist (life sciences)  . Smokeless tobacco: Never Used  Substance and Sexual Activity  . Alcohol use: No    Alcohol/week: 1.0 standard drinks    Types: 1 Cans of beer per week    Comment: socially  . Drug use: No  . Sexual activity: Never  Other Topics Concern  . Not on file  Social History Narrative   Patient is separated.   Patient lives alone and has one child.   Patient is retired.   Patient is right-handed.  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   Patient does not drink any caffeine.   Social Determinants of Health   Financial Resource Strain:   . Difficulty of Paying Living Expenses: Not on file  Food Insecurity:   . Worried About Charity fundraiser in the Last Year: Not on file  . Ran Out of Food in the Last Year: Not on file  Transportation Needs:   . Lack of Transportation (Medical): Not on file  . Lack of Transportation (Non-Medical): Not on file  Physical Activity:   . Days of Exercise per Week: Not on file  . Minutes of Exercise per Session: Not on file  Stress:   . Feeling of Stress : Not on file  Social Connections:   . Frequency of Communication with Friends and Family: Not on file  . Frequency of Social Gatherings with Friends and Family: Not on file  . Attends Religious Services: Not on file  . Active Member of Clubs or Organizations: Not on file  . Attends Archivist Meetings: Not on file  . Marital Status: Not on file  Intimate Partner Violence:   . Fear of Current or Ex-Partner: Not on file  . Emotionally Abused: Not on file  . Physically Abused: Not on file  . Sexually Abused: Not on file     PHYSICAL EXAM  Vitals:   03/25/19 1121  BP: 121/77  Pulse: 70  Temp: (!) 96.5 F (35.8 C)  TempSrc: Oral   Weight: 170 lb 9.6 oz (77.4 kg)  Height: 5\' 7"  (1.702 m)   Body mass index is 26.72 kg/m. Generalized: Well developed middle aged african [place] male, in no acute distress   Head: normocephalic and atraumatic,   Neck: Supple, no carotid bruits  Cardiac: Regular rate rhythm, no murmur  Musculoskeletal: No deformity   Neurological examination  Mentation: Alert oriented to time, place, history taking. Follows all commands speech and language fluent. Knowledge of information appropriate today MMSE - Mini Mental State Exam 03/25/2019 09/12/2018 03/12/2018  Not completed: (No Data) - -  Orientation to time 3 3 4   Orientation to Place 4 4 5   Registration 3 3 3   Attention/ Calculation 2 2 5   Recall 3 3 2   Language- name 2 objects 2 2 2   Language- repeat 1 1 1   Language- follow 3 step command 3 3 3   Language- follow 3 step command-comments - - -  Language- read & follow direction 1 1 1   Write a sentence 1 1 1   Copy design 1 1 1   Copy design-comments - named 3 animals -  Total score 24 24 28     Cranial nerve II-XII: Pupils were equal round reactive to light extraocular movements were full, visual field were full on confrontational test. Facial sensation and strength were normal. hearing was intact to finger rubbing bilaterally. Uvula tongue midline. head turning and shoulder shrug and were normal and symmetric.Tongue protrusion into cheek strength was normal.  Motor: normal bulk and tone, full strength in the BUE, BLE, fine finger movements normal, no pronator drift. No focal weakness.  Mild action tremors noted in bilateral upper extremities R>L - improved from prior visit; no evidence of tremors at rest. No tremors noted in bilateral lower extremities Sensory: normal and symmetric to light touch, pinprick, and vibration in the upper and lower extremities Gait and Station: Rising up from seated position without assistance, normal stance, without trunk ataxia, moderate stride, without evidence  of balance difficulties Reflexes: equal  and symmetric    ASSESSMENT AND PLAN  62 year African American male with history of tardive dyskinesia due to prior medication side effect as well as mild cognitive impairment.  History of right brain subcortical infarct due to small vessel disease in December 2011 with no significant residual deficits. Vascular risk factors of hypertension and hyperlipidemia. Mild memory difficulties likely due to underlying depression versus mild cognitive impairment from remote stroke and multiple psychoactive medications.  Prior difficulty with medication compliance (taking less than prescribed) at multiple follow-up visits.  Continues to follow with psychiatry regularly for ongoing monitoring and management of tardive dyskinesia, MDD, and anxiety.  Cognition has been stable.    PLAN:  -Ongoing use of primidone 250 mg twice daily for tardive dyskinesia -ongoing management by psychiatry -Previously stopped discontinued Ingrezza due to improvement of tremors but due to recent worsening per patient, advised to restart Ingrezza or speak further with psychiatrist for potentially other options.  Continue to follow with psychiatry for MDD and anxiety management -Evaluation by Dr. Sima Matas for neurocognitive evaluation in the near future.  Previously underwent neurocognitive evaluation and requested reevaluation due to daughter's concerns of worsening and question more related to underlying depression vs vascular -Continue clopidogrel and omega-3 for secondary stroke prevention   Follow-up in 6 months or call earlier if needed  I spent 32 minutes of face-to-face and non-face-to-face time with patient.  This included previsit chart review, lab review, study review, order entry, electronic health record documentation, patient education    Frann Rider, [facility]  [facility] [address] [place], [place] [address]  Phone [phone] Fax  [phone] Note: This document was prepared with digital dictation and possible smart phrase technology. Any transcriptional errors that result from this process are unintentional.

## 2019-03-25 NOTE — Patient Instructions (Signed)
Continuation of primidone 250 mg twice daily for tremors related to tardive dyskinesia  Discuss further options with your psychiatrist including possibly restarting Ingrezza or use of a different type medication    Follow-up in 6 months or call earlier if needed

## 2019-03-25 NOTE — Progress Notes (Signed)
I agree with the above plan 

## 2019-03-27 DIAGNOSIS — F332 Major depressive disorder, recurrent severe without psychotic features: Secondary | ICD-10-CM | POA: Diagnosis not present

## 2019-04-10 DIAGNOSIS — F332 Major depressive disorder, recurrent severe without psychotic features: Secondary | ICD-10-CM | POA: Diagnosis not present

## 2019-04-11 ENCOUNTER — Encounter: Payer: Self-pay | Admitting: Psychology

## 2019-04-11 ENCOUNTER — Encounter: Payer: Medicare Other | Admitting: Psychology

## 2019-04-16 ENCOUNTER — Other Ambulatory Visit: Payer: Self-pay

## 2019-04-16 ENCOUNTER — Encounter: Payer: Self-pay | Admitting: Psychiatry

## 2019-04-16 ENCOUNTER — Ambulatory Visit (INDEPENDENT_AMBULATORY_CARE_PROVIDER_SITE_OTHER): Payer: Medicare Other | Admitting: Psychiatry

## 2019-04-16 DIAGNOSIS — F419 Anxiety disorder, unspecified: Secondary | ICD-10-CM | POA: Diagnosis not present

## 2019-04-16 DIAGNOSIS — G2401 Drug induced subacute dyskinesia: Secondary | ICD-10-CM | POA: Diagnosis not present

## 2019-04-16 DIAGNOSIS — F3341 Major depressive disorder, recurrent, in partial remission: Secondary | ICD-10-CM

## 2019-04-16 MED ORDER — QUETIAPINE FUMARATE 25 MG PO TABS: ORAL_TABLET | ORAL | 1 refills | Status: DC

## 2019-04-16 MED ORDER — PAROXETINE HCL ER 37.5 MG PO TB24: 37.5000 mg | ORAL_TABLET | Freq: Every day | ORAL | 1 refills | Status: DC

## 2019-04-16 MED ORDER — BUSPIRONE HCL 10 MG PO TABS: 10.0000 mg | ORAL_TABLET | Freq: Three times a day (TID) | ORAL | 1 refills | Status: DC

## 2019-04-16 NOTE — Progress Notes (Signed)
[facility] MD OP Progress Note  I connected with  Draylen Cecilio Leon on 04/16/19 by a video enabled telemedicine application and verified that I am speaking with the correct person using two identifiers.   I discussed the limitations of evaluation and management by telemedicine. The patient expressed understanding and agreed to proceed.    04/16/2019 2:45 PM [name]  MRN:  [id number]  Chief Complaint: " I am settling in here."  HPI: Patient informed that he is doing okay right now and is getting settled in the new residential facility.  He stated that he is meeting new people.  He now knows a lot of new names.  He also informed that a lot of people now know him.  He got the first place in the walking club.  He is participating in all the activities.  He informed he was playing bingo before he came into talk to Careers information officer.  He also stated that he is finding himself helping more more people as many people have mental problems and dementia in the facility. He stated that for some reason he has been obsessing about his wife and thinking about her a lot.  He stated that he is not sure why he is doing that.  He stated that maybe he longs for a companion.  He stated he texted his wife what goes on comes around a few days ago.  That made his wife upset and she threatened to block his number but she did not.  Patient stated he is not sure why he believes that she has a role to play in him being in this facility. He stated that his sister recently told him that he should give this place at least 6 months before he makes any decision. He stated that he is hoping that he can find someone special and if there were that he may think about buying a new house and moving out with them. Writer provided patient with much supportive therapy and told him to take 1 day at a time.  He informed that he has sporadic nights when he does not get good night sleep and he wakes up around 2 or 3 AM.  He stated he is scared to go up  on the dose of Seroquel but then thinks maybe he can use some increase in the dose.  He was agreeable to going up on the dose by 25 mg. He confirmed that he is no longer taking Ingrezza. He is about to call his pharmacy to make sure that they have is correct new address so that they can keep clearing his medications to him.  Visit Diagnosis:    ICD-10-CM   1. MDD (major depressive disorder), recurrent, in partial remission ([place])  F33.41   2. Tardive dyskinesia  G24.01   3. Anxiety  F41.9     Past Psychiatric History: MDD, TD, anxiety  Past Medical History:  Past Medical History:  Diagnosis Date  . Depression   . Frequent urination   . GERD (gastroesophageal reflux disease)   . Herpes   . Hyperlipidemia   . Hypertension   . Memory loss   . Obesity   . Stroke ([place])   . Urgency of urination     Past Surgical History:  Procedure Laterality Date  . disckec    . SPINAL FUSION    . VASECTOMY        Family History:  Family History  Problem Relation Age of Onset  . Hypertension  Mother   . Hypertension Father   . Hypertension Sister   . Hypertension Brother   . Cancer Maternal Aunt   . Cancer - Ovarian Maternal Grandmother   . Diabetes Mellitus I Paternal Grandmother   . Hypertension Paternal Grandfather   . Cancer Maternal Aunt     Social History:  Social History   Socioeconomic History  . Marital status: Legally Separated    Spouse name: Not on file  . Number of children: 1  . Years of education: Masters  . Highest education level: Not on file  Occupational History  . Occupation: retired    Fish farm manager: RETIRED  Tobacco Use  . Smoking status: Former Research scientist (life sciences)  . Smokeless tobacco: Never Used  Substance and Sexual Activity  . Alcohol use: No    Alcohol/week: 1.0 standard drinks    Types: 1 Cans of beer per week    Comment: socially  . Drug use: No  . Sexual activity: Never  Other Topics Concern  . Not on file  Social History Narrative   Patient is  separated.   Patient lives alone and has one child.   Patient is retired.   Patient is right-handed.   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   Patient does not drink any caffeine.   Social Determinants of Health   Financial Resource Strain:   . Difficulty of Paying Living Expenses:   Food Insecurity:   . Worried About Charity fundraiser in the Last Year:   . Arboriculturist in the Last Year:   Transportation Needs:   . Film/video editor (Medical):   Marland Kitchen Lack of Transportation (Non-Medical):   Physical Activity:   . Days of Exercise per Week:   . Minutes of Exercise per Session:   Stress:   . Feeling of Stress :   Social Connections:   . Frequency of Communication with Friends and Family:   . Frequency of Social Gatherings with Friends and Family:   . Attends Religious Services:   . Active Member of Clubs or Organizations:   . Attends Archivist Meetings:   Marland Kitchen Marital Status:     Allergies:  Allergies  Allergen Reactions  . Acyclovir And Related   . Aspirin Other (See Comments)    Dizziness, nausea and vomiting Balance issues  . Lisinopril Other (See Comments)    Critical Cough  . Lithium Other (See Comments)    Critical Per pt reaction unknown  . Oxycodone Nausea And Vomiting  . Oxycodone-Acetaminophen Nausea And Vomiting    Also causes dizziness    Metabolic Disorder Labs: Lab Results  Component Value Date   HGBA1C 5.5 05/29/2014   MPG 120 (H) 12/20/2009   No results found for: PROLACTIN Lab Results  Component Value Date   CHOL 150 05/29/2014   TRIG 284 (H) 05/29/2014   HDL 30 (L) 05/29/2014   CHOLHDL 5.0 05/29/2014   VLDL 49 (H) 12/20/2009   LDLCALC 63 05/29/2014   LDLCALC (H) 12/20/2009    149        Total Cholesterol/HDL:CHD Risk Coronary Heart Disease Risk Table                     Men   Women  1/2 Average Risk   3.4   3.3  Average Risk       5.0   4.4  2 X Average Risk   9.6   7.1  3 X Average Risk  23.4  11.0        Use the  calculated Patient Ratio above and the CHD Risk Table to determine the patient's CHD Risk.        ATP III CLASSIFICATION (LDL):  <100     mg/dL   Optimal  100-129  mg/dL   Near or Above                    Optimal  130-159  mg/dL   Borderline  160-189  mg/dL   High  >190     mg/dL   Very High   Lab Results  Component Value Date   TSH 2.850 10/16/2013   TSH 2.460 10/31/2010    Therapeutic Level Labs: No results found for: LITHIUM No results found for: VALPROATE No components found for:  CBMZ  Current Medications: Current Outpatient Medications  Medication Sig Dispense Refill  . amLODipine (NORVASC) 10 MG tablet Take 10 mg by mouth daily.    . busPIRone (BUSPAR) 10 MG tablet Take 1 tablet (10 mg total) by mouth 3 (three) times daily. 90 tablet 1  . candesartan (ATACAND) 16 MG tablet Take 32 mg by mouth daily.     . clopidogrel (PLAVIX) 75 MG tablet Take 1 tablet (75 mg total) by mouth daily. 30 tablet 11  . Fluocinonide 0.1 % CREA APPLY 2-3 GRAMS TO AFFECTED AREAS 3-4 TIMES DAILY (1 GRAM = 1 DIME SIZE)  3  . lovastatin (MEVACOR) 10 MG tablet Take 10 mg by mouth at bedtime.    . meloxicam (MOBIC) 7.5 MG tablet Take 7.5 mg by mouth daily.    . metoprolol succinate (TOPROL-XL) 25 MG 24 hr tablet 25 mg.     . PARoxetine (PAXIL CR) 37.5 MG 24 hr tablet Take 1 tablet (37.5 mg total) by mouth at bedtime. 30 tablet 1  . primidone (MYSOLINE) 250 MG tablet Take 1 tablet (250 mg total) by mouth 2 (two) times daily. 60 tablet 12  . QUEtiapine (SEROQUEL) 50 MG tablet Take 1 tablet (50 mg total) by mouth at bedtime. 30 tablet 1  . valACYclovir (VALTREX) 1000 MG tablet Take 1,000 mg by mouth daily.     No current facility-administered medications for this visit.     Musculoskeletal: Strength & Muscle Tone: unable to assess due to telemed visit Gait & Station: unable to assess due to telemed visit Patient leans: unable to assess due to telemed visit  Psychiatric Specialty Exam: Review  of Systems  There were no vitals taken for this visit.There is no height or weight on file to calculate BMI.  General Appearance: Fairly Groomed  Eye Contact:  Good  Speech:  Clear and Coherent and Normal Rate  Volume:  Normal  Mood:  Depressed  Affect:  Sad  Thought Process:  Goal Directed and Descriptions of Associations: Intact  Orientation:  Full (Time, Place, and Person)  Thought Content: Logical   Suicidal Thoughts:  No  Homicidal Thoughts:  No  Memory:  Recent;   Good  Judgement:  Fair  Insight:  Fair  Psychomotor Activity: Orotardive dyskinesia noted  Concentration:  Concentration: Good and Attention Span: Good  Recall:  Good  Fund of Knowledge: Good  Language: Good  Akathisia:  Negative  Handed:  Right  AIMS (if indicated): Orotardive dyskinesia and coarse bilateral tremors of upper extremities noted  Assets:  Communication Skills Desire for Improvement Financial Resources/Insurance Housing  ADL's:  Intact  Cognition: Impaired,  Mild  Sleep:  Fair   Screenings:  AIMS     Admission (Discharged) from OP Visit from 02/02/2019 in [facility] Total Score  12    AUDIT     Admission (Discharged) from OP Visit from 02/02/2019 in [facility]  Alcohol Use Disorder Identification Test Final Score (AUDIT)  0    Mini-Mental     Office Visit from 03/25/2019 in [facility] Office Visit from 09/12/2018 in [facility] Office Visit from 03/12/2018 in [facility] Office Visit from 09/26/2017 in [facility] Office Visit from 06/14/2017 in [facility]  Total Score (max 30 points )  24  24  28  28  27        Assessment and Plan: Patient   1. MDD (major depressive disorder), recurrent, in partial remission (HCC)  - Continue PARoxetine (PAXIL CR) 37.5 MG 24 hr tablet; Take 1 tablet (37.5 mg total) by mouth at bedtime.  Dispense: 30 tablet;  Refill: 1 - Increase QUEtiapine (SEROQUEL) 50 MG tablet; Take 3 tablets (75 mg total) by mouth at bedtime.  Dispense: 90 tablet; Refill: 1  2. Tardive dyskinesia  - Pt has stopped taking Valbenazine Tosylate (INGREZZA) 80 MG CAPS; Take 80 mg by mouth daily.  Dispense: 30 capsule; Refill: 1. However, pt was advised to restart it if he changes his mind.  3. Anxiety  - Continue busPIRone (BUSPAR) 10 MG tablet; Take 1 tablet (10 mg total) by mouth 3 (three) times daily.  Dispense: 90 tablet; Refill: 1   Continue follow-up with neurology.  Follow-up in 4 weeks.  Nevada Crane, MD 04/16/2019, 2:45 PM

## 2019-04-24 DIAGNOSIS — Z20828 Contact with and (suspected) exposure to other viral communicable diseases: Secondary | ICD-10-CM | POA: Diagnosis not present

## 2019-04-24 DIAGNOSIS — Z1159 Encounter for screening for other viral diseases: Secondary | ICD-10-CM | POA: Diagnosis not present

## 2019-05-01 DIAGNOSIS — Z1159 Encounter for screening for other viral diseases: Secondary | ICD-10-CM | POA: Diagnosis not present

## 2019-05-01 DIAGNOSIS — Z20828 Contact with and (suspected) exposure to other viral communicable diseases: Secondary | ICD-10-CM | POA: Diagnosis not present

## 2019-05-08 DIAGNOSIS — Z1159 Encounter for screening for other viral diseases: Secondary | ICD-10-CM | POA: Diagnosis not present

## 2019-05-08 DIAGNOSIS — Z20828 Contact with and (suspected) exposure to other viral communicable diseases: Secondary | ICD-10-CM | POA: Diagnosis not present

## 2019-05-08 DIAGNOSIS — F332 Major depressive disorder, recurrent severe without psychotic features: Secondary | ICD-10-CM | POA: Diagnosis not present

## 2019-05-15 DIAGNOSIS — Z20828 Contact with and (suspected) exposure to other viral communicable diseases: Secondary | ICD-10-CM | POA: Diagnosis not present

## 2019-05-15 DIAGNOSIS — Z1159 Encounter for screening for other viral diseases: Secondary | ICD-10-CM | POA: Diagnosis not present

## 2019-05-16 ENCOUNTER — Telehealth (INDEPENDENT_AMBULATORY_CARE_PROVIDER_SITE_OTHER): Payer: Medicare Other | Admitting: Psychiatry

## 2019-05-16 ENCOUNTER — Encounter: Payer: Self-pay | Admitting: Psychiatry

## 2019-05-16 ENCOUNTER — Other Ambulatory Visit: Payer: Self-pay

## 2019-05-16 DIAGNOSIS — F419 Anxiety disorder, unspecified: Secondary | ICD-10-CM | POA: Diagnosis not present

## 2019-05-16 DIAGNOSIS — G2401 Drug induced subacute dyskinesia: Secondary | ICD-10-CM | POA: Diagnosis not present

## 2019-05-16 DIAGNOSIS — F3341 Major depressive disorder, recurrent, in partial remission: Secondary | ICD-10-CM

## 2019-05-16 MED ORDER — DOXEPIN HCL 6 MG PO TABS: 6.0000 mg | ORAL_TABLET | Freq: Every day | ORAL | 1 refills | Status: DC

## 2019-05-16 NOTE — Progress Notes (Signed)
[facility] MD OP Progress Note  I connected with  [name] on 05/16/19 by a video enabled telemedicine application and verified that I am speaking with the correct person using two identifiers.   I discussed the limitations of evaluation and management by telemedicine. The patient expressed understanding and agreed to proceed.    05/16/2019 11:53 AM [name]  MRN:  [id number]  Chief Complaint: " I am adjusting."  HPI: Patient reported that he is doing okay.  He stated that maybe things are not as bad as he thought as he has made 2 friends here.  He briefly spoke about them.  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 He informed that there is a delay in the start date as he did not realize his driver's license had expired.  He is working on getting it renewed.  He stated that he still waking up several times during the night and increase in Seroquel dose did not help much with sleep.  However he would like to continue the same dose for now.  He asked for an alternative sleeping option.  He was agreeable to try doxepin at bedtime. He was asked regarding his relationship with his daughter and he informed that he has been staying in touch with her.  He stated that his daughter still cannot believe that he is still sticking to his previous statement that he does not think this residential facility is the permanent option for him.   Visit Diagnosis:    ICD-10-CM   1. MDD (major depressive disorder), recurrent, in partial remission ([place])  F33.41   2. Tardive dyskinesia  G24.01   3. Anxiety  F41.9     Past Psychiatric History: MDD, TD, anxiety  Past Medical History:  Past Medical History:  Diagnosis Date  . Depression   . Frequent urination   . GERD (gastroesophageal reflux disease)   . Herpes   . Hyperlipidemia   . Hypertension   . Memory loss   . Obesity   . Stroke ([place])   . Urgency of urination     Past Surgical History:  Procedure Laterality Date  .  disckec    . SPINAL FUSION    . VASECTOMY        Family History:  Family History  Problem Relation Age of Onset  . Hypertension Mother   . Hypertension Father   . Hypertension Sister   . Hypertension Brother   . Cancer Maternal Aunt   . Cancer - Ovarian Maternal Grandmother   . Diabetes Mellitus I Paternal Grandmother   . Hypertension Paternal Grandfather   . Cancer Maternal Aunt     Social History:  Social History   Socioeconomic History  . Marital status: Legally Separated    Spouse name: Not on file  . Number of children: 1  . Years of education: Masters  . Highest education level: Not on file  Occupational History  . Occupation: retired    Fish farm manager: RETIRED  Tobacco Use  . Smoking status: Former Research scientist (life sciences)  . Smokeless tobacco: Never Used  Substance and Sexual Activity  . Alcohol use: No    Alcohol/week: 1.0 standard drinks    Types: 1 Cans of beer per week    Comment: socially  . Drug use: No  . Sexual activity: Never  Other Topics Concern  . Not on file  Social History Narrative   Patient is separated.   Patient lives alone and has one child.  Patient is retired.   Patient is right-handed.   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   Patient does not drink any caffeine.   Social Determinants of Health   Financial Resource Strain:   . Difficulty of Paying Living Expenses:   Food Insecurity:   . Worried About Charity fundraiser in the Last Year:   . Arboriculturist in the Last Year:   Transportation Needs:   . Film/video editor (Medical):   Marland Kitchen Lack of Transportation (Non-Medical):   Physical Activity:   . Days of Exercise per Week:   . Minutes of Exercise per Session:   Stress:   . Feeling of Stress :   Social Connections:   . Frequency of Communication with Friends and Family:   . Frequency of Social Gatherings with Friends and Family:   . Attends Religious Services:   . Active Member of Clubs or Organizations:   . Attends Theatre manager Meetings:   Marland Kitchen Marital Status:     Allergies:  Allergies  Allergen Reactions  . Acyclovir And Related   . Aspirin Other (See Comments)    Dizziness, nausea and vomiting Balance issues  . Lisinopril Other (See Comments)    Critical Cough  . Lithium Other (See Comments)    Critical Per pt reaction unknown  . Oxycodone Nausea And Vomiting  . Oxycodone-Acetaminophen Nausea And Vomiting    Also causes dizziness    Metabolic Disorder Labs: Lab Results  Component Value Date   HGBA1C 5.5 05/29/2014   MPG 120 (H) 12/20/2009   No results found for: PROLACTIN Lab Results  Component Value Date   CHOL 150 05/29/2014   TRIG 284 (H) 05/29/2014   HDL 30 (L) 05/29/2014   CHOLHDL 5.0 05/29/2014   VLDL 49 (H) 12/20/2009   LDLCALC 63 05/29/2014   LDLCALC (H) 12/20/2009    149        Total Cholesterol/HDL:CHD Risk Coronary Heart Disease Risk Table                     Men   Women  1/2 Average Risk   3.4   3.3  Average Risk       5.0   4.4  2 X Average Risk   9.6   7.1  3 X Average Risk  23.4   11.0        Use the calculated Patient Ratio above and the CHD Risk Table to determine the patient's CHD Risk.        ATP III CLASSIFICATION (LDL):  <100     mg/dL   Optimal  100-129  mg/dL   Near or Above                    Optimal  130-159  mg/dL   Borderline  160-189  mg/dL   High  >190     mg/dL   Very High   Lab Results  Component Value Date   TSH 2.850 10/16/2013   TSH 2.460 10/31/2010    Therapeutic Level Labs: No results found for: LITHIUM No results found for: VALPROATE No components found for:  CBMZ  Current Medications: Current Outpatient Medications  Medication Sig Dispense Refill  . amLODipine (NORVASC) 10 MG tablet Take 10 mg by mouth daily.    . busPIRone (BUSPAR) 10 MG tablet Take 1 tablet (10 mg total) by mouth 3 (three) times daily. 90 tablet 1  . candesartan (ATACAND) 16  MG tablet Take 32 mg by mouth daily.     . clopidogrel (PLAVIX) 75 MG  tablet Take 1 tablet (75 mg total) by mouth daily. 30 tablet 11  . Fluocinonide 0.1 % CREA APPLY 2-3 GRAMS TO AFFECTED AREAS 3-4 TIMES DAILY (1 GRAM = 1 DIME SIZE)  3  . lovastatin (MEVACOR) 10 MG tablet Take 10 mg by mouth at bedtime.    . meloxicam (MOBIC) 7.5 MG tablet Take 7.5 mg by mouth daily.    . metoprolol succinate (TOPROL-XL) 25 MG 24 hr tablet 25 mg.     . PARoxetine (PAXIL CR) 37.5 MG 24 hr tablet Take 1 tablet (37.5 mg total) by mouth at bedtime. 30 tablet 1  . primidone (MYSOLINE) 250 MG tablet Take 1 tablet (250 mg total) by mouth 2 (two) times daily. 60 tablet 12  . QUEtiapine (SEROQUEL) 25 MG tablet Take 3 tablets at bedtime (75 mg) 90 tablet 1  . valACYclovir (VALTREX) 1000 MG tablet Take 1,000 mg by mouth daily.     No current facility-administered medications for this visit.     Musculoskeletal: Strength & Muscle Tone: unable to assess due to telemed visit Gait & Station: unable to assess due to telemed visit Patient leans: unable to assess due to telemed visit  Psychiatric Specialty Exam: Review of Systems  There were no vitals taken for this visit.There is no height or weight on file to calculate BMI.  General Appearance: Fairly Groomed  Eye Contact:  Good  Speech:  Clear and Coherent and Normal Rate  Volume:  Normal  Mood: Less Depressed  Affect: Smiling more today  Thought Process:  Goal Directed and Descriptions of Associations: Intact  Orientation:  Full (Time, Place, and Person)  Thought Content: Logical   Suicidal Thoughts:  No  Homicidal Thoughts:  No  Memory:  Recent;   Good  Judgement:  Fair  Insight:  Fair  Psychomotor Activity: Orotardive dyskinesia noted  Concentration:  Concentration: Good and Attention Span: Good  Recall:  Good  Fund of Knowledge: Good  Language: Good  Akathisia:  Negative  Handed:  Right  AIMS (if indicated): Orotardive dyskinesia and coarse bilateral tremors of upper extremities noted  Assets:  Communication  Skills Desire for Improvement Financial Resources/Insurance Housing  ADL's:  Intact  Cognition: Impaired,  Mild  Sleep:  Fair   Screenings: AIMS     Admission (Discharged) from OP Visit from 02/02/2019 in [facility] Total Score  12    AUDIT     Admission (Discharged) from OP Visit from 02/02/2019 in [facility]  Alcohol Use Disorder Identification Test Final Score (AUDIT)  0    Mini-Mental     Office Visit from 03/25/2019 in [facility] Office Visit from 09/12/2018 in [facility] Office Visit from 03/12/2018 in [facility] Office Visit from 09/26/2017 in [facility] Office Visit from 06/14/2017 in [facility]  Total Score (max 30 points )  24  24  28  28  27        Assessment and Plan: Patient   1. MDD (major depressive disorder), recurrent, in partial remission (HCC)  - Continue PARoxetine (PAXIL CR) 37.5 MG 24 hr tablet; Take 1 tablet (37.5 mg total) by mouth at bedtime.  Dispense: 30 tablet; Refill: 1 - Increase QUEtiapine (SEROQUEL) 50 MG tablet; Take 3 tablets (75 mg total) by mouth at bedtime.  Dispense: 90 tablet; Refill: 1 -Start Silenor 6 mg  at bedtime.  2. Tardive dyskinesia  - Pt has stopped taking Valbenazine Tosylate (INGREZZA) 80 MG CAPS; Take 80 mg by mouth daily.  Dispense: 30 capsule; Refill: 1. However, pt was advised to restart it if he changes his mind.  3. Anxiety  - Continue busPIRone (BUSPAR) 10 MG tablet; Take 1 tablet (10 mg total) by mouth 3 (three) times daily.  Dispense: 90 tablet; Refill: 1   Continue follow-up with neurology.  Follow-up in 4 weeks.  [place] Crane, MD 05/16/2019, 11:53 AM

## 2019-05-20 ENCOUNTER — Telehealth: Payer: Self-pay

## 2019-05-20 NOTE — Telephone Encounter (Signed)
[name] called from pharmacy states that the need to speak with you about the doxepin.

## 2019-05-20 NOTE — Telephone Encounter (Signed)
I called and spoke with Sharyn Lull, pharmacist from [facility].  She informed that patient is allergic to doxepin and that is why she wanted to reach out to the Probation officer.  I asked if his insurance would cover [place] and when she ran it through the system she found that it will be covered by his pharmacy.  Janett Billow, can you please call the patient tell him that he is allergic to doxepin and therefore cannot take doxepin for sleep.  I am sending a prescription for a different sleeping medicine called Belsomra to his pharmacy. Thanks.

## 2019-05-22 ENCOUNTER — Telehealth: Payer: Self-pay | Admitting: Adult Health

## 2019-05-22 DIAGNOSIS — Z20828 Contact with and (suspected) exposure to other viral communicable diseases: Secondary | ICD-10-CM | POA: Diagnosis not present

## 2019-05-22 DIAGNOSIS — Z1159 Encounter for screening for other viral diseases: Secondary | ICD-10-CM | POA: Diagnosis not present

## 2019-05-22 DIAGNOSIS — F332 Major depressive disorder, recurrent severe without psychotic features: Secondary | ICD-10-CM | POA: Diagnosis not present

## 2019-05-22 NOTE — Telephone Encounter (Signed)
[name], [name]  I tried to get this patient in office sooner and he said he didn't know anything about the appointment we set up with him. He states he doesn't have any cognitive impairment and wanted to cancel the appointment. He also stated he didn't know who [name] was either.     Called patient and tried to get him to scheduled he refused . I asked him if he wanted me to call [name] he relayed yes. I called [name] she relayed she can't make him go .

## 2019-05-29 DIAGNOSIS — Z20828 Contact with and (suspected) exposure to other viral communicable diseases: Secondary | ICD-10-CM | POA: Diagnosis not present

## 2019-05-29 DIAGNOSIS — Z1159 Encounter for screening for other viral diseases: Secondary | ICD-10-CM | POA: Diagnosis not present

## 2019-05-30 ENCOUNTER — Telehealth: Payer: Self-pay

## 2019-05-30 DIAGNOSIS — F3341 Major depressive disorder, recurrent, in partial remission: Secondary | ICD-10-CM

## 2019-05-30 NOTE — Telephone Encounter (Signed)
Dr.Kaur's patient

## 2019-05-30 NOTE — Telephone Encounter (Signed)
received request for refills for ingrezza 80mg , paroxetine er 37.5mg  and quetiapine 25mg 

## 2019-05-31 MED ORDER — INGREZZA 80 MG PO CAPS: 80.0000 mg | ORAL_CAPSULE | Freq: Every morning | ORAL | 1 refills | Status: DC

## 2019-05-31 MED ORDER — PAROXETINE HCL ER 37.5 MG PO TB24: 37.5000 mg | ORAL_TABLET | Freq: Every day | ORAL | 1 refills | Status: DC

## 2019-05-31 MED ORDER — QUETIAPINE FUMARATE 25 MG PO TABS: ORAL_TABLET | ORAL | 1 refills | Status: DC

## 2019-05-31 NOTE — Telephone Encounter (Signed)
Prescriptions sent

## 2019-06-05 DIAGNOSIS — F332 Major depressive disorder, recurrent severe without psychotic features: Secondary | ICD-10-CM | POA: Diagnosis not present

## 2019-06-11 ENCOUNTER — Encounter: Payer: Self-pay | Admitting: Psychiatry

## 2019-06-11 ENCOUNTER — Telehealth (INDEPENDENT_AMBULATORY_CARE_PROVIDER_SITE_OTHER): Payer: Medicare Other | Admitting: Psychiatry

## 2019-06-11 ENCOUNTER — Other Ambulatory Visit: Payer: Self-pay

## 2019-06-11 DIAGNOSIS — F419 Anxiety disorder, unspecified: Secondary | ICD-10-CM

## 2019-06-11 DIAGNOSIS — F3341 Major depressive disorder, recurrent, in partial remission: Secondary | ICD-10-CM | POA: Diagnosis not present

## 2019-06-11 DIAGNOSIS — G2401 Drug induced subacute dyskinesia: Secondary | ICD-10-CM | POA: Diagnosis not present

## 2019-06-11 MED ORDER — QUETIAPINE FUMARATE 25 MG PO TABS: ORAL_TABLET | ORAL | 1 refills | Status: DC

## 2019-06-11 MED ORDER — INGREZZA 80 MG PO CAPS: 80.0000 mg | ORAL_CAPSULE | Freq: Every morning | ORAL | 1 refills | Status: DC

## 2019-06-11 MED ORDER — BELSOMRA 10 MG PO TABS: 10.0000 mg | ORAL_TABLET | Freq: Every day | ORAL | 1 refills | Status: DC

## 2019-06-11 MED ORDER — BUSPIRONE HCL 10 MG PO TABS: 10.0000 mg | ORAL_TABLET | Freq: Three times a day (TID) | ORAL | 1 refills | Status: DC

## 2019-06-11 MED ORDER — PAROXETINE HCL ER 37.5 MG PO TB24: 37.5000 mg | ORAL_TABLET | Freq: Every day | ORAL | 1 refills | Status: DC

## 2019-06-11 NOTE — Progress Notes (Addendum)
[place] MD OP Progress Note Virtual Visit via Video Note  I connected with [name] on 06/11/19 at  2:30 PM EDT by a video enabled telemedicine application and verified that I am speaking with the correct person using two identifiers.  Location: Patient: Home Provider: Clinic   I discussed the limitations of evaluation and management by telemedicine and the availability of in person appointments. The patient expressed understanding and agreed to proceed.  I provided 15 minutes of non-face-to-face time during this encounter.      06/11/2019 3:01 PM [name]  MRN:  [id number]  Chief Complaint: " Things are going okay."  HPI: Patient reported that things are falling into place and he is doing well overall. 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 Patient reports that he is going to attempt to have his work schedule reduced to two days a week. He reports that he does not feel accomplished but that he is thankful that he has something to do.At times he reports that he feels lonely and is in search of a girlfriend for companionship.   Patient endorses poor sleep. He notes that he did not receive prescription for sleep. Provider informed patient that new perscription of Belsomra would be sent. He is agreeable to continue current regimen. No other concerns noted at this time.       Visit Diagnosis:    ICD-10-CM   1. MDD (major depressive disorder), recurrent, in partial remission (HCC)  F33.41 PARoxetine (PAXIL CR) 37.5 MG 24 hr tablet    QUEtiapine (SEROQUEL) 25 MG tablet    Suvorexant (BELSOMRA) 10 MG TABS  2. Tardive dyskinesia  G24.01 Valbenazine Tosylate (INGREZZA) 80 MG CAPS  3. Anxiety  F41.9 busPIRone (BUSPAR) 10 MG tablet    Past Psychiatric History: MDD, TD, anxiety  Past Medical History:  Past Medical History:  Diagnosis Date  . Depression   . Frequent urination   . GERD  (gastroesophageal reflux disease)   . Herpes   . Hyperlipidemia   . Hypertension   . Memory loss   . Obesity   . Stroke ([place])   . Urgency of urination     Past Surgical History:  Procedure Laterality Date  . disckec    . SPINAL FUSION    . VASECTOMY        Family History:  Family History  Problem Relation Age of Onset  . Hypertension Mother   . Hypertension Father   . Hypertension Sister   . Hypertension Brother   . Cancer Maternal Aunt   . Cancer - Ovarian Maternal Grandmother   . Diabetes Mellitus I Paternal Grandmother   . Hypertension Paternal Grandfather   . Cancer Maternal Aunt     Social History:  Social History   Socioeconomic History  . Marital status: Legally Separated    Spouse name: Not on file  . Number of children: 1  . Years of education: Masters  . Highest education level: Not on file  Occupational History  . Occupation: retired    Fish farm manager: RETIRED  Tobacco Use  . Smoking status: Former Research scientist (life sciences)  . Smokeless tobacco: Never Used  Substance and Sexual Activity  . Alcohol use: No    Alcohol/week: 1.0 standard drinks    Types: 1 Cans of beer per week    Comment: socially  . Drug use: No  . Sexual activity: Never  Other Topics Concern  . Not on file  Social History Narrative   Patient is separated.   Patient lives alone and has one child.   Patient is retired.   Patient is right-handed.   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   Patient does not drink any caffeine.   Social Determinants of Health   Financial Resource Strain:   . Difficulty of Paying Living Expenses:   Food Insecurity:   . Worried About Charity fundraiser in the Last Year:   . Arboriculturist in the Last Year:   Transportation Needs:   . Film/video editor (Medical):   Marland Kitchen Lack of Transportation (Non-Medical):   Physical Activity:   . Days of Exercise per Week:   . Minutes of Exercise per Session:   Stress:   . Feeling of Stress :   Social Connections:   .  Frequency of Communication with Friends and Family:   . Frequency of Social Gatherings with Friends and Family:   . Attends Religious Services:   . Active Member of Clubs or Organizations:   . Attends Archivist Meetings:   Marland Kitchen Marital Status:     Allergies:  Allergies  Allergen Reactions  . Acyclovir And Related   . Aspirin Other (See Comments)    Dizziness, nausea and vomiting Balance issues  . Lisinopril Other (See Comments)    Critical Cough  . Lithium Other (See Comments)    Critical Per pt reaction unknown  . Oxycodone Nausea And Vomiting  . Oxycodone-Acetaminophen Nausea And Vomiting    Also causes dizziness    Metabolic Disorder Labs: Lab Results  Component Value Date   HGBA1C 5.5 05/29/2014   MPG 120 (H) 12/20/2009   No results found for: PROLACTIN Lab Results  Component Value Date   CHOL 150 05/29/2014   TRIG 284 (H) 05/29/2014   HDL 30 (L) 05/29/2014   CHOLHDL 5.0 05/29/2014   VLDL 49 (H) 12/20/2009   LDLCALC 63 05/29/2014   LDLCALC (H) 12/20/2009    149        Total Cholesterol/HDL:CHD Risk Coronary Heart Disease Risk Table                     Men   Women  1/2 Average Risk   3.4   3.3  Average Risk       5.0   4.4  2 X Average Risk   9.6   7.1  3 X Average Risk  23.4   11.0        Use the calculated Patient Ratio above and the CHD Risk Table to determine the patient's CHD Risk.        ATP III CLASSIFICATION (LDL):  <100     mg/dL   Optimal  100-129  mg/dL   Near or Above                    Optimal  130-159  mg/dL   Borderline  160-189  mg/dL   High  >190     mg/dL   Very High   Lab Results  Component Value Date   TSH 2.850 10/16/2013   TSH 2.460 10/31/2010    Therapeutic Level Labs: No results found for: LITHIUM No results found for: VALPROATE No components found for:  CBMZ  Current Medications: Current Outpatient Medications  Medication Sig Dispense Refill  . amLODipine (NORVASC) 10 MG tablet Take 10 mg by mouth daily.     Marland Kitchen  busPIRone (BUSPAR) 10 MG tablet Take 1 tablet (10 mg total) by mouth 3 (three) times daily. 90 tablet 1  . candesartan (ATACAND) 16 MG tablet Take 32 mg by mouth daily.     . clopidogrel (PLAVIX) 75 MG tablet Take 1 tablet (75 mg total) by mouth daily. 30 tablet 11  . Fluocinonide 0.1 % CREA APPLY 2-3 GRAMS TO AFFECTED AREAS 3-4 TIMES DAILY (1 GRAM = 1 DIME SIZE)  3  . lovastatin (MEVACOR) 10 MG tablet Take 10 mg by mouth at bedtime.    . meloxicam (MOBIC) 7.5 MG tablet Take 7.5 mg by mouth daily.    . metoprolol succinate (TOPROL-XL) 25 MG 24 hr tablet 25 mg.     . PARoxetine (PAXIL CR) 37.5 MG 24 hr tablet Take 1 tablet (37.5 mg total) by mouth at bedtime. 30 tablet 1  . primidone (MYSOLINE) 250 MG tablet Take 1 tablet (250 mg total) by mouth 2 (two) times daily. 60 tablet 12  . QUEtiapine (SEROQUEL) 25 MG tablet Take 3 tablets at bedtime (75 mg) 90 tablet 1  . Suvorexant (BELSOMRA) 10 MG TABS Take 10 mg by mouth at bedtime. 30 tablet 1  . valACYclovir (VALTREX) 1000 MG tablet Take 1,000 mg by mouth daily.    Minus Liberty Tosylate (INGREZZA) 80 MG CAPS Take 80 mg by mouth in the morning. 30 capsule 1   No current facility-administered medications for this visit.     Musculoskeletal: Strength & Muscle Tone: unable to assess due to telemed visit Gait & Station: unable to assess due to telemed visit Patient leans: unable to assess due to telemed visit  Psychiatric Specialty Exam: Review of Systems  There were no vitals taken for this visit.There is no height or weight on file to calculate BMI.  General Appearance: Fairly Groomed  Eye Contact:  Good  Speech:  Clear and Coherent and Normal Rate  Volume:  Normal  Mood:  Euphoric  Affect: congruent  Thought Process:  Goal Directed and Descriptions of Associations: Intact  Orientation:  Full (Time, Place, and Person)  Thought Content: Logical   Suicidal Thoughts:  No  Homicidal Thoughts:  No  Memory:  Recent;   Good  Judgement:   Fair  Insight:  Fair  Psychomotor Activity: Orotardive dyskinesia noted  Concentration:  Concentration: Good and Attention Span: Good  Recall:  Good  Fund of Knowledge: Good  Language: Good  Akathisia:  Negative  Handed:  Right  AIMS (if indicated): Orotardive dyskinesia and coarse bilateral tremors of upper extremities noted  Assets:  Communication Skills Desire for Improvement Financial Resources/Insurance Housing  ADL's:  Intact  Cognition: Impaired,  Mild  Sleep:  poor   Screenings: AIMS     Admission (Discharged) from OP Visit from 02/02/2019 in [facility] Total Score  12    AUDIT     Admission (Discharged) from OP Visit from 02/02/2019 in [facility]  Alcohol Use Disorder Identification Test Final Score (AUDIT)  0    Mini-Mental     Office Visit from 03/25/2019 in [facility] Office Visit from 09/12/2018 in [facility] Office Visit from 03/12/2018 in [facility] Office Visit from 09/26/2017 in [facility] Office Visit from 06/14/2017 in [facility]  Total Score (max 30 points )  24  24  28  28  27        Assessment and Plan: Patient reports that he is doing well overall however  notes poor sleep. Provider informed patient that perscription refills would be sent.He is agreeable to continue medications as prescribed.   1. MDD (major depressive disorder), recurrent, in partial remission (HCC)  - PARoxetine (PAXIL CR) 37.5 MG 24 hr tablet; Take 1 tablet (37.5 mg total) by mouth at bedtime.  Dispense: 30 tablet; Refill: 1 - QUEtiapine (SEROQUEL) 25 MG tablet; Take 3 tablets at bedtime (75 mg)  Dispense: 90 tablet; Refill: 1 - Start Suvorexant (BELSOMRA) 10 MG TABS; Take 10 mg by mouth at bedtime.  Dispense: 30 tablet; Refill: 1  2. Tardive dyskinesia - Valbenazine Tosylate (INGREZZA) 80 MG CAPS; Take 80 mg by mouth in the morning.   Dispense: 30 capsule; Refill: 1  3. Anxiety  - busPIRone (BUSPAR) 10 MG tablet; Take 1 tablet (10 mg total) by mouth 3 (three) times daily.  Dispense: 90 tablet; Refill: 1   Follow-up in 4 weeks.  Salley Slaughter, NP 06/11/2019, 3:01 PM   I saw and managed this patient with NP B. Ronne Binning.  [place] Crane, MD 06/11/2019 3:11 PM

## 2019-06-19 DIAGNOSIS — Z1159 Encounter for screening for other viral diseases: Secondary | ICD-10-CM | POA: Diagnosis not present

## 2019-06-19 DIAGNOSIS — Z20828 Contact with and (suspected) exposure to other viral communicable diseases: Secondary | ICD-10-CM | POA: Diagnosis not present

## 2019-06-19 DIAGNOSIS — F332 Major depressive disorder, recurrent severe without psychotic features: Secondary | ICD-10-CM | POA: Diagnosis not present

## 2019-07-15 ENCOUNTER — Telehealth (INDEPENDENT_AMBULATORY_CARE_PROVIDER_SITE_OTHER): Payer: Medicare Other | Admitting: Psychiatry

## 2019-07-15 ENCOUNTER — Other Ambulatory Visit: Payer: Self-pay

## 2019-07-15 ENCOUNTER — Encounter (HOSPITAL_COMMUNITY): Payer: Self-pay | Admitting: Psychiatry

## 2019-07-15 DIAGNOSIS — F3341 Major depressive disorder, recurrent, in partial remission: Secondary | ICD-10-CM | POA: Diagnosis not present

## 2019-07-15 DIAGNOSIS — F419 Anxiety disorder, unspecified: Secondary | ICD-10-CM | POA: Diagnosis not present

## 2019-07-15 DIAGNOSIS — G2401 Drug induced subacute dyskinesia: Secondary | ICD-10-CM

## 2019-07-15 MED ORDER — BUSPIRONE HCL 10 MG PO TABS: 10.0000 mg | ORAL_TABLET | Freq: Three times a day (TID) | ORAL | 1 refills | Status: DC

## 2019-07-15 MED ORDER — BELSOMRA 10 MG PO TABS: 10.0000 mg | ORAL_TABLET | Freq: Every day | ORAL | 1 refills | Status: DC

## 2019-07-15 MED ORDER — PAROXETINE HCL ER 37.5 MG PO TB24: 37.5000 mg | ORAL_TABLET | Freq: Every day | ORAL | 1 refills | Status: DC

## 2019-07-15 MED ORDER — QUETIAPINE FUMARATE 25 MG PO TABS: ORAL_TABLET | ORAL | 1 refills | Status: DC

## 2019-07-15 MED ORDER — INGREZZA 80 MG PO CAPS: 80.0000 mg | ORAL_CAPSULE | Freq: Every morning | ORAL | 1 refills | Status: DC

## 2019-07-15 NOTE — Progress Notes (Signed)
[name] OP Progress Note Virtual Visit via Video Note  I connected with [name] on 07/15/19 at  3:30 PM EDT by a video enabled telemedicine application and verified that I am speaking with the correct person using two identifiers.  Location: Patient: Home Provider: Clinic   I discussed the limitations of evaluation and management by telemedicine and the availability of in person appointments. The patient expressed understanding and agreed to proceed.  I provided 16 minutes of non-face-to-face time during this encounter.    07/15/2019 3:22 PM [name]  MRN:  [id number]  Chief Complaint: " Everything is going well."  HPI: Patient reported that everything is going well.  He was noted to be in physical therapy session.  He informed that he has been playing golf regularly and as result his shoulder has been hurting and his PCP advised him to do physical therapy.  Also working part-time at [employer] course.  He is enjoying his work.  He informed that he is sleeping better with the help of the Bemsomra.  He is not waking up frequently during the night. He stated that he is getting himself involved in everything and as result he is staying busy and happier. With some hesitation he informed that he has found a special lady friend who is 72 years old.  He informed that she is currently hospitalized due to a choking episode that happened in the dining area a few days ago.  She was supposed to undergo some surgery today however the risks outweighed the benefits therefore the procedure was canceled.  He has been in regular touch with her via text and is hoping that she will get better and come back soon.  He denied any other concerns and was agreeable to follow-up in 6 weeks and instead of a month this time.     Visit Diagnosis:    ICD-10-CM   1. MDD (major depressive disorder), recurrent, in partial remission ([place])  F33.41   2. Tardive dyskinesia  G24.01   3. Anxiety  F41.9     Past  Psychiatric History: MDD, TD, anxiety  Past Medical History:  Past Medical History:  Diagnosis Date  . Depression   . Frequent urination   . GERD (gastroesophageal reflux disease)   . Herpes   . Hyperlipidemia   . Hypertension   . Memory loss   . Obesity   . Stroke ([place])   . Urgency of urination     Past Surgical History:  Procedure Laterality Date  . disckec    . SPINAL FUSION    . VASECTOMY        Family History:  Family History  Problem Relation Age of Onset  . Hypertension Mother   . Hypertension Father   . Hypertension Sister   . Hypertension Brother   . Cancer Maternal Aunt   . Cancer - Ovarian Maternal Grandmother   . Diabetes Mellitus I Paternal Grandmother   . Hypertension Paternal Grandfather   . Cancer Maternal Aunt     Social History:  Social History   Socioeconomic History  . Marital status: Legally Separated    Spouse name: Not on file  . Number of children: 1  . Years of education: Masters  . Highest education level: Not on file  Occupational History  . Occupation: retired    Fish farm manager: RETIRED  Tobacco Use  . Smoking status: Former Research scientist (life sciences)  . Smokeless tobacco: Never Used  Substance and Sexual Activity  . Alcohol  use: No    Alcohol/week: 1.0 standard drink    Types: 1 Cans of beer per week    Comment: socially  . Drug use: No  . Sexual activity: Never  Other Topics Concern  . Not on file  Social History Narrative   Patient is separated.   Patient lives alone and has one child.   Patient is retired.   Patient is right-handed.   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   Patient does not drink any caffeine.   Social Determinants of Health   Financial Resource Strain:   . Difficulty of Paying Living Expenses:   Food Insecurity:   . Worried About Charity fundraiser in the Last Year:   . Arboriculturist in the Last Year:   Transportation Needs:   . Film/video editor (Medical):   Marland Kitchen Lack of Transportation (Non-Medical):    Physical Activity:   . Days of Exercise per Week:   . Minutes of Exercise per Session:   Stress:   . Feeling of Stress :   Social Connections:   . Frequency of Communication with Friends and Family:   . Frequency of Social Gatherings with Friends and Family:   . Attends Religious Services:   . Active Member of Clubs or Organizations:   . Attends Archivist Meetings:   Marland Kitchen Marital Status:     Allergies:  Allergies  Allergen Reactions  . Acyclovir And Related   . Aspirin Other (See Comments)    Dizziness, nausea and vomiting Balance issues  . Lisinopril Other (See Comments)    Critical Cough  . Lithium Other (See Comments)    Critical Per pt reaction unknown  . Oxycodone Nausea And Vomiting  . Oxycodone-Acetaminophen Nausea And Vomiting    Also causes dizziness    Metabolic Disorder Labs: Lab Results  Component Value Date   HGBA1C 5.5 05/29/2014   MPG 120 (H) 12/20/2009   No results found for: PROLACTIN Lab Results  Component Value Date   CHOL 150 05/29/2014   TRIG 284 (H) 05/29/2014   HDL 30 (L) 05/29/2014   CHOLHDL 5.0 05/29/2014   VLDL 49 (H) 12/20/2009   LDLCALC 63 05/29/2014   LDLCALC (H) 12/20/2009    149        Total Cholesterol/HDL:CHD Risk Coronary Heart Disease Risk Table                     Men   Women  1/2 Average Risk   3.4   3.3  Average Risk       5.0   4.4  2 X Average Risk   9.6   7.1  3 X Average Risk  23.4   11.0        Use the calculated Patient Ratio above and the CHD Risk Table to determine the patient's CHD Risk.        ATP III CLASSIFICATION (LDL):  <100     mg/dL   Optimal  100-129  mg/dL   Near or Above                    Optimal  130-159  mg/dL   Borderline  160-189  mg/dL   High  >190     mg/dL   Very High   Lab Results  Component Value Date   TSH 2.850 10/16/2013   TSH 2.460 10/31/2010    Therapeutic Level Labs: No results found for: LITHIUM No  results found for: VALPROATE No components found for:   CBMZ  Current Medications: Current Outpatient Medications  Medication Sig Dispense Refill  . amLODipine (NORVASC) 10 MG tablet Take 10 mg by mouth daily.    . busPIRone (BUSPAR) 10 MG tablet Take 1 tablet (10 mg total) by mouth 3 (three) times daily. 90 tablet 1  . candesartan (ATACAND) 16 MG tablet Take 32 mg by mouth daily.     . clopidogrel (PLAVIX) 75 MG tablet Take 1 tablet (75 mg total) by mouth daily. 30 tablet 11  . Fluocinonide 0.1 % CREA APPLY 2-3 GRAMS TO AFFECTED AREAS 3-4 TIMES DAILY (1 GRAM = 1 DIME SIZE)  3  . lovastatin (MEVACOR) 10 MG tablet Take 10 mg by mouth at bedtime.    . meloxicam (MOBIC) 7.5 MG tablet Take 7.5 mg by mouth daily.    . metoprolol succinate (TOPROL-XL) 25 MG 24 hr tablet 25 mg.     . PARoxetine (PAXIL CR) 37.5 MG 24 hr tablet Take 1 tablet (37.5 mg total) by mouth at bedtime. 30 tablet 1  . primidone (MYSOLINE) 250 MG tablet Take 1 tablet (250 mg total) by mouth 2 (two) times daily. 60 tablet 12  . QUEtiapine (SEROQUEL) 25 MG tablet Take 3 tablets at bedtime (75 mg) 90 tablet 1  . Suvorexant (BELSOMRA) 10 MG TABS Take 10 mg by mouth at bedtime. 30 tablet 1  . valACYclovir (VALTREX) 1000 MG tablet Take 1,000 mg by mouth daily.    Minus Liberty Tosylate (INGREZZA) 80 MG CAPS Take 80 mg by mouth in the morning. 30 capsule 1   No current facility-administered medications for this visit.     Musculoskeletal: Strength & Muscle Tone: unable to assess due to telemed visit Gait & Station: unable to assess due to telemed visit Patient leans: unable to assess due to telemed visit  Psychiatric Specialty Exam: Review of Systems  There were no vitals taken for this visit.There is no height or weight on file to calculate BMI.  General Appearance: Fairly Groomed  Eye Contact:  Good  Speech:  Clear and Coherent and Normal Rate  Volume:  Normal  Mood:  Euphoric  Affect: congruent  Thought Process:  Goal Directed and Descriptions of Associations: Intact   Orientation:  Full (Time, Place, and Person)  Thought Content: Logical   Suicidal Thoughts:  No  Homicidal Thoughts:  No  Memory:  Recent;   Good  Judgement:  Fair  Insight:  Fair  Psychomotor Activity: Orotardive dyskinesia noted  Concentration:  Concentration: Good and Attention Span: Good  Recall:  Good  Fund of Knowledge: Good  Language: Good  Akathisia:  Negative  Handed:  Right  AIMS (if indicated): Orotardive dyskinesia and coarse bilateral tremors of upper extremities noted  Assets:  Communication Skills Desire for Improvement Financial Resources/Insurance Housing  ADL's:  Intact  Cognition: Impaired,  Mild  Sleep:  poor   Screenings: AIMS     Admission (Discharged) from OP Visit from 02/02/2019 in [facility] Total Score 12    AUDIT     Admission (Discharged) from OP Visit from 02/02/2019 in [facility]  Alcohol Use Disorder Identification Test Final Score (AUDIT) 0    Mini-Mental     Office Visit from 03/25/2019 in [facility] Office Visit from 09/12/2018 in [facility] Office Visit from 03/12/2018 in [facility] Office Visit from 09/26/2017 in [facility] Office Visit from 06/14/2017 in [facility]  Total Score (max 30 points ) 24 24 28 28 27        Assessment and Plan: Patient appears to be stable on the current regimen.  We will continue the same medications for now.   1. MDD (major depressive disorder), recurrent, in partial remission (HCC)  - PARoxetine (PAXIL CR) 37.5 MG 24 hr tablet; Take 1 tablet (37.5 mg total) by mouth at bedtime.  Dispense: 30 tablet; Refill: 1 - QUEtiapine (SEROQUEL) 25 MG tablet; Take 3 tablets at bedtime (75 mg)  Dispense: 90 tablet; Refill: 1 - Suvorexant (BELSOMRA) 10 MG TABS; Take 10 mg by mouth at bedtime.  Dispense: 30 tablet; Refill: 1  2. Tardive dyskinesia - Valbenazine Tosylate  (INGREZZA) 80 MG CAPS; Take 80 mg by mouth in the morning.  Dispense: 30 capsule; Refill: 1  3. Anxiety  - busPIRone (BUSPAR) 10 MG tablet; Take 1 tablet (10 mg total) by mouth 3 (three) times daily.  Dispense: 90 tablet; Refill: 1  Continue same regimen. Follow-up in 6 weeks.  [place] Crane, MD 07/15/2019, 3:22 PM

## 2019-07-18 DIAGNOSIS — M25512 Pain in left shoulder: Secondary | ICD-10-CM | POA: Diagnosis not present

## 2019-07-18 DIAGNOSIS — M1711 Unilateral primary osteoarthritis, right knee: Secondary | ICD-10-CM | POA: Diagnosis not present

## 2019-07-18 DIAGNOSIS — M25561 Pain in right knee: Secondary | ICD-10-CM | POA: Diagnosis not present

## 2019-07-18 DIAGNOSIS — M6281 Muscle weakness (generalized): Secondary | ICD-10-CM | POA: Diagnosis not present

## 2019-07-19 DIAGNOSIS — M6281 Muscle weakness (generalized): Secondary | ICD-10-CM | POA: Diagnosis not present

## 2019-07-19 DIAGNOSIS — M25561 Pain in right knee: Secondary | ICD-10-CM | POA: Diagnosis not present

## 2019-07-19 DIAGNOSIS — M1711 Unilateral primary osteoarthritis, right knee: Secondary | ICD-10-CM | POA: Diagnosis not present

## 2019-07-19 DIAGNOSIS — M25512 Pain in left shoulder: Secondary | ICD-10-CM | POA: Diagnosis not present

## 2019-07-24 DIAGNOSIS — Z20828 Contact with and (suspected) exposure to other viral communicable diseases: Secondary | ICD-10-CM | POA: Diagnosis not present

## 2019-07-24 DIAGNOSIS — Z1159 Encounter for screening for other viral diseases: Secondary | ICD-10-CM | POA: Diagnosis not present

## 2019-07-24 DIAGNOSIS — M25561 Pain in right knee: Secondary | ICD-10-CM | POA: Diagnosis not present

## 2019-07-24 DIAGNOSIS — M1711 Unilateral primary osteoarthritis, right knee: Secondary | ICD-10-CM | POA: Diagnosis not present

## 2019-07-24 DIAGNOSIS — M25512 Pain in left shoulder: Secondary | ICD-10-CM | POA: Diagnosis not present

## 2019-07-24 DIAGNOSIS — M6281 Muscle weakness (generalized): Secondary | ICD-10-CM | POA: Diagnosis not present

## 2019-07-25 DIAGNOSIS — M6281 Muscle weakness (generalized): Secondary | ICD-10-CM | POA: Diagnosis not present

## 2019-07-25 DIAGNOSIS — M25512 Pain in left shoulder: Secondary | ICD-10-CM | POA: Diagnosis not present

## 2019-07-25 DIAGNOSIS — M1711 Unilateral primary osteoarthritis, right knee: Secondary | ICD-10-CM | POA: Diagnosis not present

## 2019-07-25 DIAGNOSIS — M25561 Pain in right knee: Secondary | ICD-10-CM | POA: Diagnosis not present

## 2019-07-26 DIAGNOSIS — M25512 Pain in left shoulder: Secondary | ICD-10-CM | POA: Diagnosis not present

## 2019-07-26 DIAGNOSIS — M1711 Unilateral primary osteoarthritis, right knee: Secondary | ICD-10-CM | POA: Diagnosis not present

## 2019-07-26 DIAGNOSIS — M6281 Muscle weakness (generalized): Secondary | ICD-10-CM | POA: Diagnosis not present

## 2019-07-26 DIAGNOSIS — M25561 Pain in right knee: Secondary | ICD-10-CM | POA: Diagnosis not present

## 2019-07-30 DIAGNOSIS — M1711 Unilateral primary osteoarthritis, right knee: Secondary | ICD-10-CM | POA: Diagnosis not present

## 2019-07-30 DIAGNOSIS — M25561 Pain in right knee: Secondary | ICD-10-CM | POA: Diagnosis not present

## 2019-07-30 DIAGNOSIS — M6281 Muscle weakness (generalized): Secondary | ICD-10-CM | POA: Diagnosis not present

## 2019-07-30 DIAGNOSIS — M25512 Pain in left shoulder: Secondary | ICD-10-CM | POA: Diagnosis not present

## 2019-07-31 DIAGNOSIS — M25561 Pain in right knee: Secondary | ICD-10-CM | POA: Diagnosis not present

## 2019-07-31 DIAGNOSIS — M25512 Pain in left shoulder: Secondary | ICD-10-CM | POA: Diagnosis not present

## 2019-07-31 DIAGNOSIS — Z1159 Encounter for screening for other viral diseases: Secondary | ICD-10-CM | POA: Diagnosis not present

## 2019-07-31 DIAGNOSIS — M6281 Muscle weakness (generalized): Secondary | ICD-10-CM | POA: Diagnosis not present

## 2019-07-31 DIAGNOSIS — Z20828 Contact with and (suspected) exposure to other viral communicable diseases: Secondary | ICD-10-CM | POA: Diagnosis not present

## 2019-07-31 DIAGNOSIS — M1711 Unilateral primary osteoarthritis, right knee: Secondary | ICD-10-CM | POA: Diagnosis not present

## 2019-07-31 DIAGNOSIS — F332 Major depressive disorder, recurrent severe without psychotic features: Secondary | ICD-10-CM | POA: Diagnosis not present

## 2019-08-01 DIAGNOSIS — Z79899 Other long term (current) drug therapy: Secondary | ICD-10-CM | POA: Diagnosis not present

## 2019-08-01 DIAGNOSIS — M1711 Unilateral primary osteoarthritis, right knee: Secondary | ICD-10-CM | POA: Diagnosis not present

## 2019-08-01 DIAGNOSIS — A6000 Herpesviral infection of urogenital system, unspecified: Secondary | ICD-10-CM | POA: Diagnosis not present

## 2019-08-01 DIAGNOSIS — I10 Essential (primary) hypertension: Secondary | ICD-10-CM | POA: Diagnosis not present

## 2019-08-01 DIAGNOSIS — M25561 Pain in right knee: Secondary | ICD-10-CM | POA: Diagnosis not present

## 2019-08-01 DIAGNOSIS — M25512 Pain in left shoulder: Secondary | ICD-10-CM | POA: Diagnosis not present

## 2019-08-01 DIAGNOSIS — Z8673 Personal history of transient ischemic attack (TIA), and cerebral infarction without residual deficits: Secondary | ICD-10-CM | POA: Diagnosis not present

## 2019-08-01 DIAGNOSIS — Z0000 Encounter for general adult medical examination without abnormal findings: Secondary | ICD-10-CM | POA: Diagnosis not present

## 2019-08-01 DIAGNOSIS — M6281 Muscle weakness (generalized): Secondary | ICD-10-CM | POA: Diagnosis not present

## 2019-08-01 DIAGNOSIS — E785 Hyperlipidemia, unspecified: Secondary | ICD-10-CM | POA: Diagnosis not present

## 2019-08-06 DIAGNOSIS — M25561 Pain in right knee: Secondary | ICD-10-CM | POA: Diagnosis not present

## 2019-08-06 DIAGNOSIS — M25512 Pain in left shoulder: Secondary | ICD-10-CM | POA: Diagnosis not present

## 2019-08-06 DIAGNOSIS — M6281 Muscle weakness (generalized): Secondary | ICD-10-CM | POA: Diagnosis not present

## 2019-08-06 DIAGNOSIS — M1711 Unilateral primary osteoarthritis, right knee: Secondary | ICD-10-CM | POA: Diagnosis not present

## 2019-08-07 DIAGNOSIS — M25512 Pain in left shoulder: Secondary | ICD-10-CM | POA: Diagnosis not present

## 2019-08-07 DIAGNOSIS — Z20828 Contact with and (suspected) exposure to other viral communicable diseases: Secondary | ICD-10-CM | POA: Diagnosis not present

## 2019-08-07 DIAGNOSIS — Z1159 Encounter for screening for other viral diseases: Secondary | ICD-10-CM | POA: Diagnosis not present

## 2019-08-07 DIAGNOSIS — M25561 Pain in right knee: Secondary | ICD-10-CM | POA: Diagnosis not present

## 2019-08-07 DIAGNOSIS — M6281 Muscle weakness (generalized): Secondary | ICD-10-CM | POA: Diagnosis not present

## 2019-08-07 DIAGNOSIS — M1711 Unilateral primary osteoarthritis, right knee: Secondary | ICD-10-CM | POA: Diagnosis not present

## 2019-08-08 DIAGNOSIS — M6281 Muscle weakness (generalized): Secondary | ICD-10-CM | POA: Diagnosis not present

## 2019-08-08 DIAGNOSIS — M25561 Pain in right knee: Secondary | ICD-10-CM | POA: Diagnosis not present

## 2019-08-08 DIAGNOSIS — M1711 Unilateral primary osteoarthritis, right knee: Secondary | ICD-10-CM | POA: Diagnosis not present

## 2019-08-08 DIAGNOSIS — M25512 Pain in left shoulder: Secondary | ICD-10-CM | POA: Diagnosis not present

## 2019-08-12 ENCOUNTER — Ambulatory Visit: Payer: Medicare Other | Admitting: Psychology

## 2019-08-13 DIAGNOSIS — M25512 Pain in left shoulder: Secondary | ICD-10-CM | POA: Diagnosis not present

## 2019-08-13 DIAGNOSIS — M6281 Muscle weakness (generalized): Secondary | ICD-10-CM | POA: Diagnosis not present

## 2019-08-13 DIAGNOSIS — M1711 Unilateral primary osteoarthritis, right knee: Secondary | ICD-10-CM | POA: Diagnosis not present

## 2019-08-13 DIAGNOSIS — M25561 Pain in right knee: Secondary | ICD-10-CM | POA: Diagnosis not present

## 2019-08-14 DIAGNOSIS — M25512 Pain in left shoulder: Secondary | ICD-10-CM | POA: Diagnosis not present

## 2019-08-14 DIAGNOSIS — M6281 Muscle weakness (generalized): Secondary | ICD-10-CM | POA: Diagnosis not present

## 2019-08-14 DIAGNOSIS — Z20828 Contact with and (suspected) exposure to other viral communicable diseases: Secondary | ICD-10-CM | POA: Diagnosis not present

## 2019-08-14 DIAGNOSIS — Z1159 Encounter for screening for other viral diseases: Secondary | ICD-10-CM | POA: Diagnosis not present

## 2019-08-14 DIAGNOSIS — M25561 Pain in right knee: Secondary | ICD-10-CM | POA: Diagnosis not present

## 2019-08-14 DIAGNOSIS — M1711 Unilateral primary osteoarthritis, right knee: Secondary | ICD-10-CM | POA: Diagnosis not present

## 2019-08-15 DIAGNOSIS — M1711 Unilateral primary osteoarthritis, right knee: Secondary | ICD-10-CM | POA: Diagnosis not present

## 2019-08-15 DIAGNOSIS — M25512 Pain in left shoulder: Secondary | ICD-10-CM | POA: Diagnosis not present

## 2019-08-15 DIAGNOSIS — M25561 Pain in right knee: Secondary | ICD-10-CM | POA: Diagnosis not present

## 2019-08-15 DIAGNOSIS — M6281 Muscle weakness (generalized): Secondary | ICD-10-CM | POA: Diagnosis not present

## 2019-08-20 DIAGNOSIS — M25512 Pain in left shoulder: Secondary | ICD-10-CM | POA: Diagnosis not present

## 2019-08-20 DIAGNOSIS — M1711 Unilateral primary osteoarthritis, right knee: Secondary | ICD-10-CM | POA: Diagnosis not present

## 2019-08-20 DIAGNOSIS — M6281 Muscle weakness (generalized): Secondary | ICD-10-CM | POA: Diagnosis not present

## 2019-08-21 DIAGNOSIS — M67912 Unspecified disorder of synovium and tendon, left shoulder: Secondary | ICD-10-CM | POA: Diagnosis not present

## 2019-08-21 DIAGNOSIS — M1711 Unilateral primary osteoarthritis, right knee: Secondary | ICD-10-CM | POA: Diagnosis not present

## 2019-08-21 DIAGNOSIS — M25512 Pain in left shoulder: Secondary | ICD-10-CM | POA: Diagnosis not present

## 2019-08-21 DIAGNOSIS — M6281 Muscle weakness (generalized): Secondary | ICD-10-CM | POA: Diagnosis not present

## 2019-08-22 DIAGNOSIS — M1711 Unilateral primary osteoarthritis, right knee: Secondary | ICD-10-CM | POA: Diagnosis not present

## 2019-08-22 DIAGNOSIS — M6281 Muscle weakness (generalized): Secondary | ICD-10-CM | POA: Diagnosis not present

## 2019-08-22 DIAGNOSIS — M25512 Pain in left shoulder: Secondary | ICD-10-CM | POA: Diagnosis not present

## 2019-08-28 DIAGNOSIS — M1711 Unilateral primary osteoarthritis, right knee: Secondary | ICD-10-CM | POA: Diagnosis not present

## 2019-08-28 DIAGNOSIS — M6281 Muscle weakness (generalized): Secondary | ICD-10-CM | POA: Diagnosis not present

## 2019-08-28 DIAGNOSIS — F332 Major depressive disorder, recurrent severe without psychotic features: Secondary | ICD-10-CM | POA: Diagnosis not present

## 2019-08-28 DIAGNOSIS — M25512 Pain in left shoulder: Secondary | ICD-10-CM | POA: Diagnosis not present

## 2019-08-29 ENCOUNTER — Telehealth (INDEPENDENT_AMBULATORY_CARE_PROVIDER_SITE_OTHER): Payer: Medicare Other | Admitting: Psychiatry

## 2019-08-29 ENCOUNTER — Encounter (HOSPITAL_COMMUNITY): Payer: Self-pay | Admitting: Psychiatry

## 2019-08-29 ENCOUNTER — Other Ambulatory Visit: Payer: Self-pay

## 2019-08-29 DIAGNOSIS — F3342 Major depressive disorder, recurrent, in full remission: Secondary | ICD-10-CM

## 2019-08-29 DIAGNOSIS — M6281 Muscle weakness (generalized): Secondary | ICD-10-CM | POA: Diagnosis not present

## 2019-08-29 DIAGNOSIS — F419 Anxiety disorder, unspecified: Secondary | ICD-10-CM | POA: Diagnosis not present

## 2019-08-29 DIAGNOSIS — G2401 Drug induced subacute dyskinesia: Secondary | ICD-10-CM | POA: Diagnosis not present

## 2019-08-29 DIAGNOSIS — F3341 Major depressive disorder, recurrent, in partial remission: Secondary | ICD-10-CM | POA: Diagnosis not present

## 2019-08-29 DIAGNOSIS — M25512 Pain in left shoulder: Secondary | ICD-10-CM | POA: Diagnosis not present

## 2019-08-29 DIAGNOSIS — M1711 Unilateral primary osteoarthritis, right knee: Secondary | ICD-10-CM | POA: Diagnosis not present

## 2019-08-29 MED ORDER — BELSOMRA 10 MG PO TABS: 10.0000 mg | ORAL_TABLET | Freq: Every day | ORAL | 1 refills | Status: DC

## 2019-08-29 MED ORDER — BUSPIRONE HCL 10 MG PO TABS: 10.0000 mg | ORAL_TABLET | Freq: Three times a day (TID) | ORAL | 1 refills | Status: DC

## 2019-08-29 MED ORDER — INGREZZA 80 MG PO CAPS: 80.0000 mg | ORAL_CAPSULE | Freq: Every morning | ORAL | 1 refills | Status: DC

## 2019-08-29 MED ORDER — QUETIAPINE FUMARATE 25 MG PO TABS: ORAL_TABLET | ORAL | 1 refills | Status: DC

## 2019-08-29 NOTE — Progress Notes (Signed)
[facility] MD OP Progress Note Virtual Visit via Video Note  I connected with [name] on 08/29/19 at  2:00 PM EDT by a video enabled telemedicine application and verified that I am speaking with the correct person using two identifiers.  Location: Patient: Home Provider: Clinic   I discussed the limitations of evaluation and management by telemedicine and the availability of in person appointments. The patient expressed understanding and agreed to proceed.  I provided 15 minutes of non-face-to-face time during this encounter.    08/29/2019 2:13 PM [name]  MRN:  [id number]  Chief Complaint: " I am psychologically and mentally well."  HPI: Pt reported that he is doing very well. His significant other is doing well too and is out of the hospital and back in the facility now.  He stated that he has been in good relationship with her.  He stated that things are going really well now.  He stated that he has not thought about the future of the relationship and he is just enjoying every minute right now.  He is still playing golf and working at [employer] course part-time.  He stated that he is in great shape psychologically and mentally and emotionally. He requested for an in office appointment for next session.  Visit Diagnosis:    ICD-10-CM   1. Recurrent major depressive disorder, in full remission ([place])  F33.42   2. Tardive dyskinesia  G24.01     Past Psychiatric History: MDD, TD, anxiety  Past Medical History:  Past Medical History:  Diagnosis Date  . Depression   . Frequent urination   . GERD (gastroesophageal reflux disease)   . Herpes   . Hyperlipidemia   . Hypertension   . Memory loss   . Obesity   . Stroke ([place])   . Urgency of urination     Past Surgical History:  Procedure Laterality Date  . disckec    . SPINAL FUSION    . VASECTOMY        Family History:  Family History  Problem Relation Age of Onset  . Hypertension Mother   . Hypertension Father    . Hypertension Sister   . Hypertension Brother   . Cancer Maternal Aunt   . Cancer - Ovarian Maternal Grandmother   . Diabetes Mellitus I Paternal Grandmother   . Hypertension Paternal Grandfather   . Cancer Maternal Aunt     Social History:  Social History   Socioeconomic History  . Marital status: Legally Separated    Spouse name: Not on file  . Number of children: 1  . Years of education: Masters  . Highest education level: Not on file  Occupational History  . Occupation: retired    Fish farm manager: RETIRED  Tobacco Use  . Smoking status: Former Research scientist (life sciences)  . Smokeless tobacco: Never Used  Substance and Sexual Activity  . Alcohol use: No    Alcohol/week: 1.0 standard drink    Types: 1 Cans of beer per week    Comment: socially  . Drug use: No  . Sexual activity: Never  Other Topics Concern  . Not on file  Social History Narrative   Patient is separated.   Patient lives alone and has one child.   Patient is retired.   Patient is right-handed.   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   Patient does not drink any caffeine.   Social Determinants of Health   Financial Resource Strain:   . Difficulty of Paying Living  Expenses:   Food Insecurity:   . Worried About Charity fundraiser in the Last Year:   . Arboriculturist in the Last Year:   Transportation Needs:   . Film/video editor (Medical):   Marland Kitchen Lack of Transportation (Non-Medical):   Physical Activity:   . Days of Exercise per Week:   . Minutes of Exercise per Session:   Stress:   . Feeling of Stress :   Social Connections:   . Frequency of Communication with Friends and Family:   . Frequency of Social Gatherings with Friends and Family:   . Attends Religious Services:   . Active Member of Clubs or Organizations:   . Attends Archivist Meetings:   Marland Kitchen Marital Status:     Allergies:  Allergies  Allergen Reactions  . Acyclovir And Related   . Aspirin Other (See Comments)    Dizziness, nausea and  vomiting Balance issues  . Lisinopril Other (See Comments)    Critical Cough  . Lithium Other (See Comments)    Critical Per pt reaction unknown  . Oxycodone Nausea And Vomiting  . Oxycodone-Acetaminophen Nausea And Vomiting    Also causes dizziness    Metabolic Disorder Labs: Lab Results  Component Value Date   HGBA1C 5.5 05/29/2014   MPG 120 (H) 12/20/2009   No results found for: PROLACTIN Lab Results  Component Value Date   CHOL 150 05/29/2014   TRIG 284 (H) 05/29/2014   HDL 30 (L) 05/29/2014   CHOLHDL 5.0 05/29/2014   VLDL 49 (H) 12/20/2009   LDLCALC 63 05/29/2014   LDLCALC (H) 12/20/2009    149        Total Cholesterol/HDL:CHD Risk Coronary Heart Disease Risk Table                     Men   Women  1/2 Average Risk   3.4   3.3  Average Risk       5.0   4.4  2 X Average Risk   9.6   7.1  3 X Average Risk  23.4   11.0        Use the calculated Patient Ratio above and the CHD Risk Table to determine the patient's CHD Risk.        ATP III CLASSIFICATION (LDL):  <100     mg/dL   Optimal  100-129  mg/dL   Near or Above                    Optimal  130-159  mg/dL   Borderline  160-189  mg/dL   High  >190     mg/dL   Very High   Lab Results  Component Value Date   TSH 2.850 10/16/2013   TSH 2.460 10/31/2010    Therapeutic Level Labs: No results found for: LITHIUM No results found for: VALPROATE No components found for:  CBMZ  Current Medications: Current Outpatient Medications  Medication Sig Dispense Refill  . amLODipine (NORVASC) 10 MG tablet Take 10 mg by mouth daily.    . busPIRone (BUSPAR) 10 MG tablet Take 1 tablet (10 mg total) by mouth 3 (three) times daily. 90 tablet 1  . candesartan (ATACAND) 16 MG tablet Take 32 mg by mouth daily.     . clopidogrel (PLAVIX) 75 MG tablet Take 1 tablet (75 mg total) by mouth daily. 30 tablet 11  . Fluocinonide 0.1 % CREA APPLY 2-3 GRAMS TO AFFECTED AREAS  3-4 TIMES DAILY (1 GRAM = 1 DIME SIZE)  3  . lovastatin  (MEVACOR) 10 MG tablet Take 10 mg by mouth at bedtime.    . meloxicam (MOBIC) 7.5 MG tablet Take 7.5 mg by mouth daily.    . metoprolol succinate (TOPROL-XL) 25 MG 24 hr tablet 25 mg.     . PARoxetine (PAXIL CR) 37.5 MG 24 hr tablet Take 1 tablet (37.5 mg total) by mouth at bedtime. 30 tablet 1  . primidone (MYSOLINE) 250 MG tablet Take 1 tablet (250 mg total) by mouth 2 (two) times daily. 60 tablet 12  . QUEtiapine (SEROQUEL) 25 MG tablet Take 3 tablets at bedtime (75 mg) 90 tablet 1  . Suvorexant (BELSOMRA) 10 MG TABS Take 10 mg by mouth at bedtime. 30 tablet 1  . valACYclovir (VALTREX) 1000 MG tablet Take 1,000 mg by mouth daily.    Min[place] Tosylate (INGREZZA) 80 MG CAPS Take 80 mg by mouth in the morning. 30 capsule 1   No current facility-administered medications for this visit.     Musculoskeletal: Strength & Muscle Tone: unable to assess due to telemed visit Gait & Station: unable to assess due to telemed visit Patient leans: unable to assess due to telemed visit  Psychiatric Specialty Exam: Review of Systems  There were no vitals taken for this visit.There is no height or weight on file to calculate BMI.  General Appearance: Fairly Groomed  Eye Contact:  Good  Speech:  Clear and Coherent and Normal Rate  Volume:  Normal  Mood:  Euthymic  Affect: congruent  Thought Process:  Goal Directed and Descriptions of Associations: Intact  Orientation:  Full (Time, Place, and Person)  Thought Content: Logical   Suicidal Thoughts:  No  Homicidal Thoughts:  No  Memory:  Recent;   Good  Judgement:  Fair  Insight:  Fair  Psychomotor Activity: Mild Orotardive dyskinesia noted  Concentration:  Concentration: Good and Attention Span: Good  Recall:  Good  Fund of Knowledge: Good  Language: Good  Akathisia:  Negative  Handed:  Right  AIMS (if indicated): Orotardive dyskinesia   Assets:  Communication Skills Desire for Improvement Financial Resources/Insurance Housing  ADL's:   Intact  Cognition: Impaired,  Mild  Sleep:  Good, improved   Screenings: AIMS     Admission (Discharged) from OP Visit from 02/02/2019 in [facility] Total Score 12    AUDIT     Admission (Discharged) from OP Visit from 02/02/2019 in [facility]  Alcohol Use Disorder Identification Test Final Score (AUDIT) 0    Mini-Mental     Office Visit from 03/25/2019 in [facility] Office Visit from 09/12/2018 in [facility] Office Visit from 03/12/2018 in [facility] Office Visit from 09/26/2017 in [facility] Office Visit from 06/14/2017 in [facility]  Total Score (max 30 points ) 24 24 28 28 27        Assessment and Plan: Patient appears to be stable on the current regimen.  We will continue the same medications for now.   1. MDD (major depressive disorder), recurrent, in full remission ([place])  - PARoxetine (PAXIL CR) 37.5 MG 24 hr tablet; Take 1 tablet (37.5 mg total) by mouth at bedtime.  Dispense: 30 tablet; Refill: 1 - QUEtiapine (SEROQUEL) 25 MG tablet; Take 3 tablets at bedtime (75 mg)  Dispense: 90 tablet; Refill: 1 - Suvorexant (BELSOMRA) 10 MG TABS; Take 10 mg by mouth at  bedtime.  Dispense: 30 tablet; Refill: 1  2. Tardive dyskinesia - Valbenazine Tosylate (INGREZZA) 80 MG CAPS; Take 80 mg by mouth in the morning.  Dispense: 30 capsule; Refill: 1  3. Anxiety  - busPIRone (BUSPAR) 10 MG tablet; Take 1 tablet (10 mg total) by mouth 3 (three) times daily.  Dispense: 90 tablet; Refill: 1  Continue same regimen. Follow-up in 8 weeks.  Nevada Crane, MD 08/29/2019, 2:13 PM

## 2019-08-30 DIAGNOSIS — M1711 Unilateral primary osteoarthritis, right knee: Secondary | ICD-10-CM | POA: Diagnosis not present

## 2019-08-30 DIAGNOSIS — M25512 Pain in left shoulder: Secondary | ICD-10-CM | POA: Diagnosis not present

## 2019-08-30 DIAGNOSIS — M6281 Muscle weakness (generalized): Secondary | ICD-10-CM | POA: Diagnosis not present

## 2019-09-03 DIAGNOSIS — M25512 Pain in left shoulder: Secondary | ICD-10-CM | POA: Diagnosis not present

## 2019-09-03 DIAGNOSIS — M1711 Unilateral primary osteoarthritis, right knee: Secondary | ICD-10-CM | POA: Diagnosis not present

## 2019-09-03 DIAGNOSIS — M6281 Muscle weakness (generalized): Secondary | ICD-10-CM | POA: Diagnosis not present

## 2019-09-04 DIAGNOSIS — M25512 Pain in left shoulder: Secondary | ICD-10-CM | POA: Diagnosis not present

## 2019-09-04 DIAGNOSIS — M1711 Unilateral primary osteoarthritis, right knee: Secondary | ICD-10-CM | POA: Diagnosis not present

## 2019-09-04 DIAGNOSIS — M6281 Muscle weakness (generalized): Secondary | ICD-10-CM | POA: Diagnosis not present

## 2019-09-05 DIAGNOSIS — M1711 Unilateral primary osteoarthritis, right knee: Secondary | ICD-10-CM | POA: Diagnosis not present

## 2019-09-05 DIAGNOSIS — M25512 Pain in left shoulder: Secondary | ICD-10-CM | POA: Diagnosis not present

## 2019-09-05 DIAGNOSIS — M6281 Muscle weakness (generalized): Secondary | ICD-10-CM | POA: Diagnosis not present

## 2019-09-10 DIAGNOSIS — M6281 Muscle weakness (generalized): Secondary | ICD-10-CM | POA: Diagnosis not present

## 2019-09-10 DIAGNOSIS — M25512 Pain in left shoulder: Secondary | ICD-10-CM | POA: Diagnosis not present

## 2019-09-10 DIAGNOSIS — M1711 Unilateral primary osteoarthritis, right knee: Secondary | ICD-10-CM | POA: Diagnosis not present

## 2019-09-11 DIAGNOSIS — F332 Major depressive disorder, recurrent severe without psychotic features: Secondary | ICD-10-CM | POA: Diagnosis not present

## 2019-09-11 DIAGNOSIS — M67912 Unspecified disorder of synovium and tendon, left shoulder: Secondary | ICD-10-CM | POA: Diagnosis not present

## 2019-09-12 DIAGNOSIS — M25512 Pain in left shoulder: Secondary | ICD-10-CM | POA: Diagnosis not present

## 2019-09-12 DIAGNOSIS — M1711 Unilateral primary osteoarthritis, right knee: Secondary | ICD-10-CM | POA: Diagnosis not present

## 2019-09-12 DIAGNOSIS — M6281 Muscle weakness (generalized): Secondary | ICD-10-CM | POA: Diagnosis not present

## 2019-09-13 DIAGNOSIS — M25512 Pain in left shoulder: Secondary | ICD-10-CM | POA: Diagnosis not present

## 2019-09-13 DIAGNOSIS — M6281 Muscle weakness (generalized): Secondary | ICD-10-CM | POA: Diagnosis not present

## 2019-09-13 DIAGNOSIS — M1711 Unilateral primary osteoarthritis, right knee: Secondary | ICD-10-CM | POA: Diagnosis not present

## 2019-09-18 DIAGNOSIS — Z1159 Encounter for screening for other viral diseases: Secondary | ICD-10-CM | POA: Diagnosis not present

## 2019-09-18 DIAGNOSIS — Z20828 Contact with and (suspected) exposure to other viral communicable diseases: Secondary | ICD-10-CM | POA: Diagnosis not present

## 2019-09-19 DIAGNOSIS — M25512 Pain in left shoulder: Secondary | ICD-10-CM | POA: Diagnosis not present

## 2019-09-20 ENCOUNTER — Telehealth: Payer: Self-pay | Admitting: Adult Health

## 2019-09-20 NOTE — Telephone Encounter (Signed)
[name], Pt had an MRI this past week and he had a 66m fu 9/8 that he can't make due to work. He needs to be rescheduled for a Wed or Thurs to review the MRI results. I scheduled him for 12/23 and waitlisted. Can a HFU be opened for this pt to get him in sooner?  Thank you

## 2019-09-25 ENCOUNTER — Ambulatory Visit: Payer: Medicare Other | Admitting: Adult Health

## 2019-09-25 DIAGNOSIS — F332 Major depressive disorder, recurrent severe without psychotic features: Secondary | ICD-10-CM | POA: Diagnosis not present

## 2019-10-09 DIAGNOSIS — F332 Major depressive disorder, recurrent severe without psychotic features: Secondary | ICD-10-CM | POA: Diagnosis not present

## 2019-10-16 DIAGNOSIS — N5201 Erectile dysfunction due to arterial insufficiency: Secondary | ICD-10-CM | POA: Diagnosis not present

## 2019-10-18 ENCOUNTER — Other Ambulatory Visit: Payer: Self-pay | Admitting: Adult Health

## 2019-10-18 DIAGNOSIS — G2401 Drug induced subacute dyskinesia: Secondary | ICD-10-CM

## 2019-10-18 DIAGNOSIS — G250 Essential tremor: Secondary | ICD-10-CM

## 2019-10-22 DIAGNOSIS — Z1159 Encounter for screening for other viral diseases: Secondary | ICD-10-CM | POA: Diagnosis not present

## 2019-10-22 DIAGNOSIS — Z20828 Contact with and (suspected) exposure to other viral communicable diseases: Secondary | ICD-10-CM | POA: Diagnosis not present

## 2019-10-23 DIAGNOSIS — F332 Major depressive disorder, recurrent severe without psychotic features: Secondary | ICD-10-CM | POA: Diagnosis not present

## 2019-10-24 ENCOUNTER — Ambulatory Visit (HOSPITAL_COMMUNITY): Payer: Medicare Other | Admitting: Psychiatry

## 2019-10-25 DIAGNOSIS — Z20828 Contact with and (suspected) exposure to other viral communicable diseases: Secondary | ICD-10-CM | POA: Diagnosis not present

## 2019-10-25 DIAGNOSIS — R635 Abnormal weight gain: Secondary | ICD-10-CM | POA: Diagnosis not present

## 2019-10-25 DIAGNOSIS — M67912 Unspecified disorder of synovium and tendon, left shoulder: Secondary | ICD-10-CM | POA: Diagnosis not present

## 2019-10-25 DIAGNOSIS — Z1159 Encounter for screening for other viral diseases: Secondary | ICD-10-CM | POA: Diagnosis not present

## 2019-10-25 DIAGNOSIS — R600 Localized edema: Secondary | ICD-10-CM | POA: Diagnosis not present

## 2019-10-29 DIAGNOSIS — Z1159 Encounter for screening for other viral diseases: Secondary | ICD-10-CM | POA: Diagnosis not present

## 2019-10-29 DIAGNOSIS — Z20828 Contact with and (suspected) exposure to other viral communicable diseases: Secondary | ICD-10-CM | POA: Diagnosis not present

## 2019-11-06 DIAGNOSIS — M25612 Stiffness of left shoulder, not elsewhere classified: Secondary | ICD-10-CM | POA: Diagnosis not present

## 2019-11-06 DIAGNOSIS — M67912 Unspecified disorder of synovium and tendon, left shoulder: Secondary | ICD-10-CM | POA: Diagnosis not present

## 2019-11-06 DIAGNOSIS — M6281 Muscle weakness (generalized): Secondary | ICD-10-CM | POA: Diagnosis not present

## 2019-11-06 DIAGNOSIS — F332 Major depressive disorder, recurrent severe without psychotic features: Secondary | ICD-10-CM | POA: Diagnosis not present

## 2019-11-07 DIAGNOSIS — M67912 Unspecified disorder of synovium and tendon, left shoulder: Secondary | ICD-10-CM | POA: Diagnosis not present

## 2019-11-07 DIAGNOSIS — M6281 Muscle weakness (generalized): Secondary | ICD-10-CM | POA: Diagnosis not present

## 2019-11-07 DIAGNOSIS — M25612 Stiffness of left shoulder, not elsewhere classified: Secondary | ICD-10-CM | POA: Diagnosis not present

## 2019-11-08 DIAGNOSIS — Z1159 Encounter for screening for other viral diseases: Secondary | ICD-10-CM | POA: Diagnosis not present

## 2019-11-08 DIAGNOSIS — Z20828 Contact with and (suspected) exposure to other viral communicable diseases: Secondary | ICD-10-CM | POA: Diagnosis not present

## 2019-11-12 DIAGNOSIS — Z20828 Contact with and (suspected) exposure to other viral communicable diseases: Secondary | ICD-10-CM | POA: Diagnosis not present

## 2019-11-12 DIAGNOSIS — Z1159 Encounter for screening for other viral diseases: Secondary | ICD-10-CM | POA: Diagnosis not present

## 2019-11-14 DIAGNOSIS — N5201 Erectile dysfunction due to arterial insufficiency: Secondary | ICD-10-CM | POA: Diagnosis not present

## 2019-11-14 DIAGNOSIS — R351 Nocturia: Secondary | ICD-10-CM | POA: Diagnosis not present

## 2019-11-19 DIAGNOSIS — M25612 Stiffness of left shoulder, not elsewhere classified: Secondary | ICD-10-CM | POA: Diagnosis not present

## 2019-11-19 DIAGNOSIS — M67912 Unspecified disorder of synovium and tendon, left shoulder: Secondary | ICD-10-CM | POA: Diagnosis not present

## 2019-11-19 DIAGNOSIS — M6281 Muscle weakness (generalized): Secondary | ICD-10-CM | POA: Diagnosis not present

## 2019-11-20 ENCOUNTER — Other Ambulatory Visit (HOSPITAL_COMMUNITY): Payer: Self-pay | Admitting: Psychiatry

## 2019-11-20 DIAGNOSIS — F3341 Major depressive disorder, recurrent, in partial remission: Secondary | ICD-10-CM

## 2019-11-21 DIAGNOSIS — M67912 Unspecified disorder of synovium and tendon, left shoulder: Secondary | ICD-10-CM | POA: Diagnosis not present

## 2019-11-21 DIAGNOSIS — M6281 Muscle weakness (generalized): Secondary | ICD-10-CM | POA: Diagnosis not present

## 2019-11-21 DIAGNOSIS — M25612 Stiffness of left shoulder, not elsewhere classified: Secondary | ICD-10-CM | POA: Diagnosis not present

## 2019-11-26 DIAGNOSIS — Z1159 Encounter for screening for other viral diseases: Secondary | ICD-10-CM | POA: Diagnosis not present

## 2019-11-26 DIAGNOSIS — Z20828 Contact with and (suspected) exposure to other viral communicable diseases: Secondary | ICD-10-CM | POA: Diagnosis not present

## 2019-11-27 DIAGNOSIS — M67912 Unspecified disorder of synovium and tendon, left shoulder: Secondary | ICD-10-CM | POA: Diagnosis not present

## 2019-11-27 DIAGNOSIS — M6281 Muscle weakness (generalized): Secondary | ICD-10-CM | POA: Diagnosis not present

## 2019-11-27 DIAGNOSIS — M25612 Stiffness of left shoulder, not elsewhere classified: Secondary | ICD-10-CM | POA: Diagnosis not present

## 2019-11-29 DIAGNOSIS — N5201 Erectile dysfunction due to arterial insufficiency: Secondary | ICD-10-CM | POA: Diagnosis not present

## 2019-11-29 DIAGNOSIS — Z23 Encounter for immunization: Secondary | ICD-10-CM | POA: Diagnosis not present

## 2019-12-04 DIAGNOSIS — F332 Major depressive disorder, recurrent severe without psychotic features: Secondary | ICD-10-CM | POA: Diagnosis not present

## 2019-12-06 DIAGNOSIS — M67912 Unspecified disorder of synovium and tendon, left shoulder: Secondary | ICD-10-CM | POA: Diagnosis not present

## 2019-12-10 DIAGNOSIS — Z1159 Encounter for screening for other viral diseases: Secondary | ICD-10-CM | POA: Diagnosis not present

## 2019-12-10 DIAGNOSIS — R5382 Chronic fatigue, unspecified: Secondary | ICD-10-CM | POA: Diagnosis not present

## 2019-12-10 DIAGNOSIS — Z20828 Contact with and (suspected) exposure to other viral communicable diseases: Secondary | ICD-10-CM | POA: Diagnosis not present

## 2019-12-10 DIAGNOSIS — Z79899 Other long term (current) drug therapy: Secondary | ICD-10-CM | POA: Diagnosis not present

## 2019-12-14 ENCOUNTER — Other Ambulatory Visit: Payer: Self-pay

## 2019-12-14 ENCOUNTER — Inpatient Hospital Stay (HOSPITAL_COMMUNITY)
Admission: EM | Admit: 2019-12-14 | Discharge: 2019-12-18 | DRG: 418 | Disposition: A | Discharge status: Home / Self Care | Payer: Medicare Other | Attending: Family Medicine | Admitting: Family Medicine

## 2019-12-14 ENCOUNTER — Emergency Department (HOSPITAL_COMMUNITY): Payer: Medicare Other

## 2019-12-14 DIAGNOSIS — Z981 Arthrodesis status: Secondary | ICD-10-CM

## 2019-12-14 DIAGNOSIS — R072 Precordial pain: Secondary | ICD-10-CM | POA: Diagnosis present

## 2019-12-14 DIAGNOSIS — Z833 Family history of diabetes mellitus: Secondary | ICD-10-CM

## 2019-12-14 DIAGNOSIS — E669 Obesity, unspecified: Secondary | ICD-10-CM | POA: Diagnosis not present

## 2019-12-14 DIAGNOSIS — Z8673 Personal history of transient ischemic attack (TIA), and cerebral infarction without residual deficits: Secondary | ICD-10-CM

## 2019-12-14 DIAGNOSIS — B009 Herpesviral infection, unspecified: Secondary | ICD-10-CM | POA: Diagnosis present

## 2019-12-14 DIAGNOSIS — Z6832 Body mass index (BMI) 32.0-32.9, adult: Secondary | ICD-10-CM

## 2019-12-14 DIAGNOSIS — Z20822 Contact with and (suspected) exposure to covid-19: Secondary | ICD-10-CM | POA: Diagnosis not present

## 2019-12-14 DIAGNOSIS — K219 Gastro-esophageal reflux disease without esophagitis: Secondary | ICD-10-CM | POA: Diagnosis present

## 2019-12-14 DIAGNOSIS — G2401 Drug induced subacute dyskinesia: Secondary | ICD-10-CM | POA: Diagnosis present

## 2019-12-14 DIAGNOSIS — Z885 Allergy status to narcotic agent status: Secondary | ICD-10-CM

## 2019-12-14 DIAGNOSIS — R7401 Elevation of levels of liver transaminase levels: Secondary | ICD-10-CM | POA: Diagnosis not present

## 2019-12-14 DIAGNOSIS — K8012 Calculus of gallbladder with acute and chronic cholecystitis without obstruction: Secondary | ICD-10-CM | POA: Diagnosis not present

## 2019-12-14 DIAGNOSIS — Z886 Allergy status to analgesic agent status: Secondary | ICD-10-CM

## 2019-12-14 DIAGNOSIS — F3341 Major depressive disorder, recurrent, in partial remission: Secondary | ICD-10-CM | POA: Diagnosis present

## 2019-12-14 DIAGNOSIS — Z8041 Family history of malignant neoplasm of ovary: Secondary | ICD-10-CM

## 2019-12-14 DIAGNOSIS — Z7902 Long term (current) use of antithrombotics/antiplatelets: Secondary | ICD-10-CM

## 2019-12-14 DIAGNOSIS — R079 Chest pain, unspecified: Secondary | ICD-10-CM | POA: Diagnosis not present

## 2019-12-14 DIAGNOSIS — Z91018 Allergy to other foods: Secondary | ICD-10-CM

## 2019-12-14 DIAGNOSIS — Z79899 Other long term (current) drug therapy: Secondary | ICD-10-CM

## 2019-12-14 DIAGNOSIS — Z8249 Family history of ischemic heart disease and other diseases of the circulatory system: Secondary | ICD-10-CM

## 2019-12-14 DIAGNOSIS — Z888 Allergy status to other drugs, medicaments and biological substances status: Secondary | ICD-10-CM

## 2019-12-14 DIAGNOSIS — N179 Acute kidney failure, unspecified: Secondary | ICD-10-CM | POA: Diagnosis not present

## 2019-12-14 DIAGNOSIS — E876 Hypokalemia: Secondary | ICD-10-CM | POA: Diagnosis not present

## 2019-12-14 DIAGNOSIS — R0789 Other chest pain: Secondary | ICD-10-CM | POA: Diagnosis not present

## 2019-12-14 DIAGNOSIS — R1011 Right upper quadrant pain: Secondary | ICD-10-CM | POA: Diagnosis present

## 2019-12-14 DIAGNOSIS — I10 Essential (primary) hypertension: Secondary | ICD-10-CM | POA: Diagnosis not present

## 2019-12-14 DIAGNOSIS — Z87891 Personal history of nicotine dependence: Secondary | ICD-10-CM

## 2019-12-14 DIAGNOSIS — Z791 Long term (current) use of non-steroidal anti-inflammatories (NSAID): Secondary | ICD-10-CM

## 2019-12-14 DIAGNOSIS — F419 Anxiety disorder, unspecified: Secondary | ICD-10-CM | POA: Diagnosis present

## 2019-12-14 DIAGNOSIS — E785 Hyperlipidemia, unspecified: Secondary | ICD-10-CM | POA: Diagnosis present

## 2019-12-14 LAB — CBC
HCT: 43.1 % (ref 39.0–52.0)
Hemoglobin: 14.9 g/dL (ref 13.0–17.0)
MCH: 31.8 pg (ref 26.0–34.0)
MCHC: 34.6 g/dL (ref 30.0–36.0)
MCV: 92.1 fL (ref 80.0–100.0)
Platelets: 273 10*3/uL (ref 150–400)
RBC: 4.68 MIL/uL (ref 4.22–5.81)
RDW: 12.2 % (ref 11.5–15.5)
WBC: 8.3 10*3/uL (ref 4.0–10.5)
nRBC: 0 % (ref 0.0–0.2)

## 2019-12-14 MED ORDER — NITROGLYCERIN 0.4 MG SL SUBL
0.4000 mg | SUBLINGUAL_TABLET | SUBLINGUAL | Status: DC | PRN
  Administered 2019-12-14 – 2019-12-15 (×4): 0.4 mg via SUBLINGUAL
  Filled 2019-12-14 (×2): qty 1

## 2019-12-14 NOTE — ED Provider Notes (Signed)
[facility] EMERGENCY DEPARTMENT Provider Note   CSN: [id number] Arrival date & time: 12/14/19  2307     History Chief Complaint  Patient presents with  . Chest Pain    [name] is a 69 y.o. male with a history of hypertension, hyperlipidemia, prior stroke, GERD, and former tobacco use who presents to the emergency department via EMS with complaints of chest pain that began at 2100 this evening.  Patient states that the discomfort woke him from sleep, it is to the left sided and central chest, feels like an aching discomfort.  Initially no alleviating or aggravating factors, called EMS due to concern, was given nitroglycerin in route which improved his discomfort to a 5 out of 10 from a 9 out of 10 in severity.  He states that he is allergic to aspirin and cannot take this.  He denies nausea, vomiting, diaphoresis, shortness of breath, dizziness, lightheadedness, or syncope.  He denies prior CAD.  He had a stress test in [place] many years ago.  Does not see a cardiologist locally. Denies leg pain/swelling, hemoptysis, recent surgery/trauma, recent long travel, hormone use, personal hx of cancer, or hx of DVT/PE. He did not have dinner before bed or eat anything greasy/fatty just prior to sleep.   HPI     Past Medical History:  Diagnosis Date  . Depression   . Frequent urination   . GERD (gastroesophageal reflux disease)   . Herpes   . Hyperlipidemia   . Hypertension   . Memory loss   . Obesity   . Stroke ([place])   . Urgency of urination     Patient Active Problem List   Diagnosis Date Noted  . Anxiety 02/27/2019  . MDD (major depressive disorder), recurrent, in partial remission ([place]) 02/07/2019  . Severe recurrent major depression without psychotic features ([place]) 02/02/2019  . Moderate episode of recurrent major depressive disorder ([place]) 12/03/2018  . Tardive dyskinesia 12/03/2018  . Hyperlipidemia 06/14/2017  . History of stroke 11/11/2015    . Depression 10/16/2013  . Memory loss 10/16/2013  . Tremor 04/04/2013    Past Surgical History:  Procedure Laterality Date  . disckec    . SPINAL FUSION    . VASECTOMY         Family History  Problem Relation Age of Onset  . Hypertension Mother   . Hypertension Father   . Hypertension Sister   . Hypertension Brother   . Cancer Maternal Aunt   . Cancer - Ovarian Maternal Grandmother   . Diabetes Mellitus I Paternal Grandmother   . Hypertension Paternal Grandfather   . Cancer Maternal Aunt     Social History   Tobacco Use  . Smoking status: Former Research scientist (life sciences)  . Smokeless tobacco: Never Used  Substance Use Topics  . Alcohol use: No    Alcohol/week: 1.0 standard drink    Types: 1 Cans of beer per week    Comment: socially  . Drug use: No    Home Medications Prior to Admission medications   Medication Sig Start Date End Date Taking? Authorizing Provider  amLODipine (NORVASC) 10 MG tablet Take 10 mg by mouth daily.    [provider]  busPIRone (BUSPAR) 10 MG tablet Take 1 tablet (10 mg total) by mouth 3 (three) times daily. 08/29/19   Nevada Crane, MD  candesartan (ATACAND) 16 MG tablet Take 32 mg by mouth daily.  10/04/18   [provider]  clopidogrel (PLAVIX) 75 MG tablet Take  1 tablet (75 mg total) by mouth daily. 11/11/15   Dennie Bible, NP  Fluocinonide 0.1 % CREA APPLY 2-3 GRAMS TO AFFECTED AREAS 3-4 TIMES DAILY (1 GRAM = 1 DIME SIZE) 08/04/16   [provider]  lovastatin (MEVACOR) 10 MG tablet Take 10 mg by mouth at bedtime.    [provider]  meloxicam (MOBIC) 7.5 MG tablet Take 7.5 mg by mouth daily. 08/16/18   [provider]  metoprolol succinate (TOPROL-XL) 25 MG 24 hr tablet 25 mg.  10/04/18   [provider]  PARoxetine (PAXIL-CR) 37.5 MG 24 hr tablet TAKE 1 TABLET (37.5 MG TOTAL) BY MOUTH AT BEDTIME. 11/20/19   Nevada Crane, MD  primidone (MYSOLINE) 250 MG tablet TAKE 1 TABLET BY MOUTH TWICE A  DAY 10/21/19   Frann Rider, NP  QUEtiapine (SEROQUEL) 25 MG tablet Take 3 tablets at bedtime (75 mg) 08/29/19   Nevada Crane, MD  Suvorexant (BELSOMRA) 10 MG TABS Take 10 mg by mouth at bedtime. 08/29/19   Nevada Crane, MD  valACYclovir (VALTREX) 1000 MG tablet Take 1,000 mg by mouth daily.    [provider]  Valbenazine Tosylate (INGREZZA) 80 MG CAPS Take 80 mg by mouth in the morning. 08/29/19   Nevada Crane, MD    Allergies    Acyclovir and related, Aspirin, Lisinopril, Lithium, Oxycodone, and Oxycodone-acetaminophen  Review of Systems   Review of Systems  Constitutional: Negative for chills, diaphoresis and fever.  Respiratory: Negative for shortness of breath.   Cardiovascular: Positive for chest pain. Negative for leg swelling.  Gastrointestinal: Negative for abdominal pain, nausea and vomiting.  Neurological: Negative for syncope, facial asymmetry and light-headedness.  All other systems reviewed and are negative.   Physical Exam Updated Vital Signs BP 140/80   Pulse 66   Temp 98.7 F (37.1 C) (Oral)   Resp 18   Ht 5\' 10"  (1.778 m)   Wt 77.1 kg   SpO2 97%   BMI 24.39 kg/m   Physical Exam Vitals and nursing note reviewed.  Constitutional:      General: He is not in acute distress.    Appearance: He is well-developed. He is not toxic-appearing.  HENT:     Head: Normocephalic and atraumatic.  Eyes:     General:        Right eye: No discharge.        Left eye: No discharge.     Conjunctiva/sclera: Conjunctivae normal.  Cardiovascular:     Rate and Rhythm: Normal rate and regular rhythm.     Pulses:          Radial pulses are 2+ on the right side and 2+ on the left side.  Pulmonary:     Effort: Pulmonary effort is normal. No respiratory distress.     Breath sounds: Normal breath sounds. No wheezing, rhonchi or rales.  Chest:     Chest wall: No tenderness.  Abdominal:     General: There is no distension.     Palpations: Abdomen is soft.      Tenderness: There is no abdominal tenderness.  Musculoskeletal:     Cervical back: Neck supple.     Comments: No asymmetric pitting edema noted.  Skin:    General: Skin is warm and dry.     Findings: No rash.  Neurological:     Mental Status: He is alert.     Comments: Clear speech.   Psychiatric:        Behavior: Behavior normal.  ED Results / Procedures / Treatments   Labs (all labs ordered are listed, but only abnormal results are displayed) Labs Reviewed  CBC  BASIC METABOLIC PANEL  TROPONIN I (HIGH SENSITIVITY)    EKG EKG Interpretation  Date/Time:  Saturday December 14 2019 23:09:58 EST Ventricular Rate:  65 PR Interval:    QRS Duration: 137 QT Interval:  429 QTC Calculation: 447 R Axis:   -79 Text Interpretation: Sinus rhythm RBBB and LAFB LVH by voltage Confirmed by Ripley Fraise [phone]) on 12/14/2019 11:38:02 PM   Radiology DG Chest 2 View  Result Date: 12/14/2019 CLINICAL DATA:  Chest pain, left lower. EXAM: CHEST - 2 VIEW COMPARISON:  Chest x-ray 12/28/2009 FINDINGS: The heart size and mediastinal contours are within normal limits. Aortic arch calcifications. Right base airspace opacity. No focal consolidation. No pulmonary edema. No pleural effusion. No pneumothorax. Persistent elevation of the left hemidiaphragm. No acute osseous abnormality. Multilevel degenerative changes of the spine with a midthoracic vertebral body demonstrating vertebral body height loss. IMPRESSION: 1. Right base airspace opacity likely represents compressive changes. Overlying infection/inflammation not excluded. 2. Age-indeterminate midthoracic vertebral body compression fracture. Correlate with point tenderness to evaluate for acute component. Electronically Signed   By: Iven Finn M.D.   On: 12/14/2019 23:45    Procedures Procedures (including critical care time)  Medications Ordered in ED Medications  nitroGLYCERIN (NITROSTAT) SL tablet 0.4 mg (0.4 mg Sublingual  Given 12/14/19 2347)    ED Course  I have reviewed the triage vital signs and the nursing notes.  Pertinent labs & imaging results that were available during my care of the patient were reviewed by me and considered in my medical decision making (see chart for details).    MDM Rules/Calculators/A&P                         Patient presents to the ED with complaints of chest pain.  He is nontoxic, resting comfortably, his vitals are within normal limits.  He has a benign physical exam.Aspirin not given as patient reports allergy. Additional nitroglycerin ordered. Heart RRR, lungs clear, abdomen nontender.   DDx: ACS, pulmonary embolism, dissection, pneumothorax, Boerhaave syndrome, musculoskeletal pain, GERD/abdominal process, anxiety.   Additional history obtained:  Additional history obtained from chart review nursing note reviewed.  EKG: Sinus rhythm RBBB and LAFB LVH by voltage  Lab Tests:  I Ordered, reviewed, and interpreted labs, which included:  CBC: Unremarkable CMP: Elevated LFTs: 2:1 AST:ALT pattern.  Troponin: 6  Imaging Studies ordered:  I ordered imaging studies which included CXR, I independently visualized and interpreted imaging which showed right base airspace opacity likely represents compressive changes. Overlying infection/inflammation not excluded- no cough/dyspnea/fevers to suggest infectious process. Age-indeterminate midthoracic vertebral body compression fracture.   Low risk wells- doubt PE No widened mediastinum on CXR, symmetric pulses, doubt dissection.  HEAR Score via heart pathway is 6- will plan for admission at this time.   02:40: Re-eval: updated on results & plan of care- in agreement, currently chest pain free. Having some anxiety & requesting his buspar which will be ordered.   Findings and plan of care discussed with supervising physician Dr. Christy Gentles who has evaluated patient & is in agreement.   03:04: CONSULT: Discussed with hospitalist Dr.  Myna Hidalgo, requesting add on lipase to broaden work-up- I am in agreement with this - has been ordered, accepts admission.   Portions of this note were generated with Lobbyist. Dictation errors may occur  despite best attempts at proofreading.  Final Clinical Impression(s) / ED Diagnoses Final diagnoses:  Chest pain, unspecified type    Rx / DC Orders ED Discharge Orders    None       Amaryllis Dyke, PA-C 12/15/19 0523    Ripley Fraise, MD 12/15/19 [phone]

## 2019-12-14 NOTE — ED Notes (Signed)
Pt transferred to [place]

## 2019-12-14 NOTE — ED Triage Notes (Addendum)
Pt was at home and about 2 hr ago the pain in his chest woke him up/ Pt said pain was in the left side and radiated into the center of his chest. 9/10 pain ems gave 1 nitro and pain went to 5/10 and dull. NO n/v Pt took 4 tums thinking it was acid reflux

## 2019-12-15 ENCOUNTER — Observation Stay (HOSPITAL_COMMUNITY): Payer: Medicare Other

## 2019-12-15 ENCOUNTER — Encounter (HOSPITAL_COMMUNITY): Payer: Self-pay | Admitting: Family Medicine

## 2019-12-15 DIAGNOSIS — R072 Precordial pain: Secondary | ICD-10-CM | POA: Diagnosis present

## 2019-12-15 DIAGNOSIS — Z8673 Personal history of transient ischemic attack (TIA), and cerebral infarction without residual deficits: Secondary | ICD-10-CM | POA: Diagnosis not present

## 2019-12-15 DIAGNOSIS — K8020 Calculus of gallbladder without cholecystitis without obstruction: Secondary | ICD-10-CM | POA: Diagnosis not present

## 2019-12-15 DIAGNOSIS — K7689 Other specified diseases of liver: Secondary | ICD-10-CM | POA: Diagnosis not present

## 2019-12-15 DIAGNOSIS — R079 Chest pain, unspecified: Secondary | ICD-10-CM | POA: Diagnosis not present

## 2019-12-15 DIAGNOSIS — G2401 Drug induced subacute dyskinesia: Secondary | ICD-10-CM | POA: Diagnosis not present

## 2019-12-15 DIAGNOSIS — N179 Acute kidney failure, unspecified: Secondary | ICD-10-CM | POA: Diagnosis not present

## 2019-12-15 DIAGNOSIS — E669 Obesity, unspecified: Secondary | ICD-10-CM | POA: Diagnosis not present

## 2019-12-15 DIAGNOSIS — I10 Essential (primary) hypertension: Secondary | ICD-10-CM | POA: Diagnosis not present

## 2019-12-15 DIAGNOSIS — R1011 Right upper quadrant pain: Secondary | ICD-10-CM | POA: Diagnosis not present

## 2019-12-15 DIAGNOSIS — Z20822 Contact with and (suspected) exposure to covid-19: Secondary | ICD-10-CM | POA: Diagnosis not present

## 2019-12-15 DIAGNOSIS — Z6832 Body mass index (BMI) 32.0-32.9, adult: Secondary | ICD-10-CM | POA: Diagnosis not present

## 2019-12-15 DIAGNOSIS — F419 Anxiety disorder, unspecified: Secondary | ICD-10-CM | POA: Diagnosis not present

## 2019-12-15 DIAGNOSIS — K8012 Calculus of gallbladder with acute and chronic cholecystitis without obstruction: Secondary | ICD-10-CM | POA: Diagnosis not present

## 2019-12-15 DIAGNOSIS — F3341 Major depressive disorder, recurrent, in partial remission: Secondary | ICD-10-CM | POA: Diagnosis not present

## 2019-12-15 DIAGNOSIS — R7401 Elevation of levels of liver transaminase levels: Secondary | ICD-10-CM | POA: Diagnosis not present

## 2019-12-15 DIAGNOSIS — R0789 Other chest pain: Secondary | ICD-10-CM | POA: Diagnosis not present

## 2019-12-15 DIAGNOSIS — E785 Hyperlipidemia, unspecified: Secondary | ICD-10-CM | POA: Diagnosis not present

## 2019-12-15 DIAGNOSIS — E876 Hypokalemia: Secondary | ICD-10-CM | POA: Diagnosis not present

## 2019-12-15 DIAGNOSIS — K219 Gastro-esophageal reflux disease without esophagitis: Secondary | ICD-10-CM | POA: Diagnosis not present

## 2019-12-15 LAB — COMPREHENSIVE METABOLIC PANEL
ALT: 75 U/L — ABNORMAL HIGH (ref 0–44)
ALT: 79 U/L — ABNORMAL HIGH (ref 0–44)
AST: 176 U/L — ABNORMAL HIGH (ref 15–41)
AST: 148 U/L — ABNORMAL HIGH (ref 15–41)
Albumin: 3.7 g/dL (ref 3.5–5.0)
Albumin: 3.9 g/dL (ref 3.5–5.0)
Alkaline Phosphatase: 52 U/L (ref 38–126)
Alkaline Phosphatase: 55 U/L (ref 38–126)
Anion gap: 9 (ref 5–15)
Anion gap: 10 (ref 5–15)
BUN: 13 mg/dL (ref 8–23)
BUN: 14 mg/dL (ref 8–23)
CO2: 25 mmol/L (ref 22–32)
CO2: 24 mmol/L (ref 22–32)
Calcium: 9.2 mg/dL (ref 8.9–10.3)
Calcium: 9.4 mg/dL (ref 8.9–10.3)
Chloride: 101 mmol/L (ref 98–111)
Chloride: 100 mmol/L (ref 98–111)
Creatinine, Ser: 0.97 mg/dL (ref 0.61–1.24)
Creatinine, Ser: 1.02 mg/dL (ref 0.61–1.24)
GFR, Estimated: >60 mL/min (ref >60)
GFR, Estimated: >60 mL/min (ref >60)
Glucose, Bld: 105 mg/dL — ABNORMAL HIGH (ref 70–99)
Glucose, Bld: 100 mg/dL — ABNORMAL HIGH (ref 70–99)
Potassium: 4.1 mmol/L (ref 3.5–5.1)
Potassium: 4.2 mmol/L (ref 3.5–5.1)
Sodium: 135 mmol/L (ref 135–145)
Sodium: 134 mmol/L — ABNORMAL LOW (ref 135–145)
Total Bilirubin: 0.5 mg/dL (ref 0.3–1.2)
Total Bilirubin: 0.8 mg/dL (ref 0.3–1.2)
Total Protein: 6.4 g/dL — ABNORMAL LOW (ref 6.5–8.1)
Total Protein: 6.9 g/dL (ref 6.5–8.1)

## 2019-12-15 LAB — ECHOCARDIOGRAM COMPLETE
Area-P 1/2: 3.83 cm2
Height: 67 in
S' Lateral: 3.2 cm
Weight: 3291.03 oz

## 2019-12-15 LAB — URINALYSIS, ROUTINE W REFLEX MICROSCOPIC
Bilirubin Urine: NEGATIVE
Glucose, UA: NEGATIVE mg/dL
Hgb urine dipstick: NEGATIVE
Ketones, ur: NEGATIVE mg/dL
Leukocytes,Ua: NEGATIVE
Nitrite: NEGATIVE
Protein, ur: NEGATIVE mg/dL
Specific Gravity, Urine: 1.005 (ref 1.005–1.030)
pH: 7 (ref 5.0–8.0)

## 2019-12-15 LAB — HEPATITIS PANEL, ACUTE
HCV Ab: NONREACTIVE
Hep A IgM: NONREACTIVE
Hep B C IgM: NONREACTIVE
Hepatitis B Surface Ag: NONREACTIVE

## 2019-12-15 LAB — RESP PANEL BY RT-PCR (FLU A&B, COVID) ARPGX2
Influenza A by PCR: NEGATIVE
Influenza B by PCR: NEGATIVE
SARS Coronavirus 2 by RT PCR: NEGATIVE

## 2019-12-15 LAB — TROPONIN I (HIGH SENSITIVITY)
Troponin I (High Sensitivity): 6 ng/L (ref <18)
Troponin I (High Sensitivity): 7 ng/L (ref <18)
Troponin I (High Sensitivity): 5 ng/L (ref <18)

## 2019-12-15 LAB — LIPASE, BLOOD: Lipase: 45 U/L (ref 11–51)

## 2019-12-15 LAB — TSH: TSH: 2.605 u[IU]/mL (ref 0.350–4.500)

## 2019-12-15 LAB — CK: Total CK: 155 U/L (ref 49–397)

## 2019-12-15 LAB — BRAIN NATRIURETIC PEPTIDE: B Natriuretic Peptide: 19.2 pg/mL (ref 0.0–100.0)

## 2019-12-15 LAB — GLUCOSE, CAPILLARY: Glucose-Capillary: 89 mg/dL (ref 70–99)

## 2019-12-15 LAB — HIV ANTIBODY (ROUTINE TESTING W REFLEX): HIV Screen 4th Generation wRfx: NONREACTIVE

## 2019-12-15 MED ORDER — AMLODIPINE BESYLATE 10 MG PO TABS
10.0000 mg | ORAL_TABLET | Freq: Every day | ORAL | Status: DC
  Administered 2019-12-15: 10 mg via ORAL
  Filled 2019-12-15: qty 1

## 2019-12-15 MED ORDER — METOPROLOL SUCCINATE ER 25 MG PO TB24
25.0000 mg | ORAL_TABLET | Freq: Every day | ORAL | Status: DC
  Administered 2019-12-15 – 2019-12-18 (×4): 25 mg via ORAL
  Filled 2019-12-15 (×4): qty 1

## 2019-12-15 MED ORDER — ALUM & MAG HYDROXIDE-SIMETH 200-200-20 MG/5ML PO SUSP: 30.0000 mL | ORAL | Status: DC | PRN

## 2019-12-15 MED ORDER — PANTOPRAZOLE SODIUM 40 MG PO TBEC
40.0000 mg | DELAYED_RELEASE_TABLET | Freq: Every day | ORAL | Status: DC
  Administered 2019-12-15 – 2019-12-18 (×4): 40 mg via ORAL
  Filled 2019-12-15 (×4): qty 1

## 2019-12-15 MED ORDER — PAROXETINE HCL ER 37.5 MG PO TB24
37.5000 mg | ORAL_TABLET | Freq: Every day | ORAL | Status: DC
  Administered 2019-12-15 – 2019-12-17 (×3): 37.5 mg via ORAL
  Filled 2019-12-15 (×4): qty 1

## 2019-12-15 MED ORDER — CLOPIDOGREL BISULFATE 75 MG PO TABS
75.0000 mg | ORAL_TABLET | Freq: Every day | ORAL | Status: DC
  Administered 2019-12-15: 75 mg via ORAL
  Filled 2019-12-15: qty 1

## 2019-12-15 MED ORDER — ONDANSETRON HCL 4 MG/2ML IJ SOLN
4.0000 mg | Freq: Four times a day (QID) | INTRAMUSCULAR | Status: DC | PRN
  Administered 2019-12-15 – 2019-12-17 (×3): 4 mg via INTRAVENOUS
  Filled 2019-12-15 (×3): qty 2

## 2019-12-15 MED ORDER — ASPIRIN 300 MG RE SUPP: 300.0000 mg | RECTAL | Status: DC

## 2019-12-15 MED ORDER — SUVOREXANT 10 MG PO TABS
10.0000 mg | ORAL_TABLET | Freq: Every day | ORAL | Status: DC
  Administered 2019-12-16 – 2019-12-17 (×2): 10 mg via ORAL
  Filled 2019-12-15: qty 1

## 2019-12-15 MED ORDER — FUROSEMIDE 10 MG/ML IJ SOLN
40.0000 mg | Freq: Once | INTRAMUSCULAR | Status: AC
  Administered 2019-12-15: 40 mg via INTRAVENOUS
  Filled 2019-12-15: qty 4

## 2019-12-15 MED ORDER — IRBESARTAN 150 MG PO TABS
150.0000 mg | ORAL_TABLET | Freq: Every day | ORAL | Status: DC
  Administered 2019-12-15 – 2019-12-16 (×2): 150 mg via ORAL
  Filled 2019-12-15 (×2): qty 1

## 2019-12-15 MED ORDER — BUSPIRONE HCL 5 MG PO TABS
10.0000 mg | ORAL_TABLET | Freq: Three times a day (TID) | ORAL | Status: DC
  Administered 2019-12-15 – 2019-12-18 (×9): 10 mg via ORAL
  Filled 2019-12-15 (×9): qty 2

## 2019-12-15 MED ORDER — ASPIRIN EC 81 MG PO TBEC: 81.0000 mg | DELAYED_RELEASE_TABLET | Freq: Every day | ORAL | Status: DC

## 2019-12-15 MED ORDER — VALBENAZINE TOSYLATE 80 MG PO CAPS: 80.0000 mg | ORAL_CAPSULE | Freq: Every morning | ORAL | Status: DC

## 2019-12-15 MED ORDER — ASPIRIN 81 MG PO CHEW: 324.0000 mg | CHEWABLE_TABLET | ORAL | Status: DC

## 2019-12-15 MED ORDER — ENOXAPARIN SODIUM 40 MG/0.4ML ~~LOC~~ SOLN
40.0000 mg | SUBCUTANEOUS | Status: DC
  Administered 2019-12-15: 40 mg via SUBCUTANEOUS
  Filled 2019-12-15 (×2): qty 0.4

## 2019-12-15 MED ORDER — QUETIAPINE FUMARATE 25 MG PO TABS
75.0000 mg | ORAL_TABLET | Freq: Every day | ORAL | Status: DC
  Administered 2019-12-15 – 2019-12-17 (×3): 75 mg via ORAL
  Filled 2019-12-15 (×3): qty 3

## 2019-12-15 MED ORDER — ACETAMINOPHEN 325 MG PO TABS
650.0000 mg | ORAL_TABLET | ORAL | Status: DC | PRN
  Administered 2019-12-16 (×2): 650 mg via ORAL
  Filled 2019-12-15 (×3): qty 2

## 2019-12-15 MED ORDER — BUSPIRONE HCL 10 MG PO TABS
10.0000 mg | ORAL_TABLET | Freq: Once | ORAL | Status: AC
  Administered 2019-12-15: 10 mg via ORAL
  Filled 2019-12-15: qty 1

## 2019-12-15 MED ORDER — PRIMIDONE 250 MG PO TABS
250.0000 mg | ORAL_TABLET | Freq: Two times a day (BID) | ORAL | Status: DC
  Administered 2019-12-15 – 2019-12-18 (×6): 250 mg via ORAL
  Filled 2019-12-15 (×8): qty 1

## 2019-12-15 NOTE — Progress Notes (Signed)
  Echocardiogram 2D Echocardiogram has been performed.  [name] F 12/15/2019, 9:55 AM

## 2019-12-15 NOTE — ED Notes (Signed)
Report given to RN

## 2019-12-15 NOTE — Consult Note (Signed)
[facility] Consult Note  [name] December 29, 1950  [id number].    Requesting MD: Cyndia Skeeters, MD Chief Complaint/Reason for Consult: cholelithiasis, r/o cholecystitis  HPI:  [name] is a 69 y/o M with a PMH HTN, HLD, GERD, CVA in 2015 on Plavix, tardive dyskinesia, major depressive disorder, and erectile dysfunction who presented to the ED via EMS 22/27/21 with a cc chest pain. He reports stabbing, left-sided chest pain that woke him up from his sleep at 9:00 PM. Took Tums without relief of symptoms. He reports a history of similar epigastric/left-sided chest pain pain in the past, not necessarily associated with meals, that resolves with tums or nitroglycerin. This time the pain moved across his lower chest/upper abdomen to his right side and his girlfriend convinced him to call EMS for medical attention. States the pain resolved with 3 doses of nitroglycerin.   Patient denies a history of exertional chest pain or dyspnea. Denies a history of heart attack or cardiac arrhythmia. States he is active and plays golf. He states he is able to walk a mile and/or up a flight of stairs without stopping. He denies a known history of gallstones. Has a history of a gastric ulcer when he was in college. Denies a history of abdominal surgery. He is a former smoker who quit over 30 years. Reports drinking alcohol very rarely (less than monthly), and denies illicit drug use. He is retired.   ROS: as above Review of Systems  Constitutional: Negative for chills and fever.  Respiratory: Negative for cough, hemoptysis, sputum production and shortness of breath.   Gastrointestinal: Negative for blood in stool, constipation, diarrhea, melena, nausea and vomiting.  All other systems reviewed and are negative.   Family History  Problem Relation Age of Onset  . Hypertension Mother   . Hypertension Father   . Hypertension Sister   . Hypertension Brother   . Cancer Maternal Aunt   . Cancer -  Ovarian Maternal Grandmother   . Diabetes Mellitus I Paternal Grandmother   . Hypertension Paternal Grandfather   . Cancer Maternal Aunt     Past Medical History:  Diagnosis Date  . Depression   . Frequent urination   . GERD (gastroesophageal reflux disease)   . Herpes   . Hyperlipidemia   . Hypertension   . Memory loss   . Obesity   . Stroke ([place])   . Urgency of urination     Past Surgical History:  Procedure Laterality Date  . disckec    . SPINAL FUSION    . VASECTOMY      Social History:  reports that he has quit smoking. He has never used smokeless tobacco. He reports that he does not drink alcohol and does not use drugs.  Allergies:  Allergies  Allergen Reactions  . Aspirin Other (See Comments)    Dizziness, nausea and vomiting Balance issues  . Lisinopril Other (See Comments)    Critical Cough  . Lithium Other (See Comments)    Critical Per pt reaction unknown  . Oxycodone Nausea And Vomiting  . Oxycodone-Acetaminophen Nausea And Vomiting    Also causes dizziness    Medications Prior to Admission  Medication Sig Dispense Refill  . amLODipine (NORVASC) 10 MG tablet Take 10 mg by mouth daily.    . busPIRone (BUSPAR) 10 MG tablet Take 1 tablet (10 mg total) by mouth 3 (three) times daily. 90 tablet 1  . candesartan (ATACAND) 32 MG tablet Take 32 mg by mouth daily.    Marland Kitchen  clopidogrel (PLAVIX) 75 MG tablet Take 1 tablet (75 mg total) by mouth daily. 30 tablet 11  . Fluocinonide 0.1 % CREA APPLY 2-3 GRAMS TO AFFECTED AREAS 3-4 TIMES DAILY (1 GRAM = 1 DIME SIZE)  3  . lovastatin (MEVACOR) 10 MG tablet Take 10 mg by mouth at bedtime.    . lovastatin (MEVACOR) 20 MG tablet Take 20 mg by mouth daily.    . meloxicam (MOBIC) 7.5 MG tablet Take 7.5 mg by mouth daily.    . metoprolol succinate (TOPROL-XL) 25 MG 24 hr tablet Take 25 mg by mouth daily.     Marland Kitchen PARoxetine (PAXIL-CR) 37.5 MG 24 hr tablet TAKE 1 TABLET (37.5 MG TOTAL) BY MOUTH AT BEDTIME. 30 tablet 1  .  primidone (MYSOLINE) 250 MG tablet TAKE 1 TABLET BY MOUTH TWICE A DAY (Patient taking differently: Take 250 mg by mouth 2 (two) times daily. ) 60 tablet 12  . QUEtiapine (SEROQUEL) 25 MG tablet Take 3 tablets at bedtime (75 mg) 90 tablet 1  . Suvorexant (BELSOMRA) 10 MG TABS Take 10 mg by mouth at bedtime. 30 tablet 1  . valACYclovir (VALTREX) 1000 MG tablet Take 1,000 mg by mouth daily.    Minus Liberty Tosylate (INGREZZA) 80 MG CAPS Take 80 mg by mouth in the morning. 30 capsule 1    Blood pressure 131/85, pulse 65, temperature 98.6 F (37 C), temperature source Oral, resp. rate 19, height 5\' 7"  (1.702 m), weight 93.3 kg, SpO2 93 %. Physical Exam: Constitutional: NAD; conversant; no deformities Eyes: Moist conjunctiva; no lid lag; anicteric; PERRL Neck: Trachea midline; no thyromegaly Lungs: Normal respiratory effort; CTAB; nontender chest wall CV: RRR; no palpable thrills; no pitting edema GI: Abd soft, TTP RUQ without rebound or guarding, negative murphy's sign, very mild epigastric tenderness, No tenderness in the LUQ or lower abdomen, no palpable hepatosplenomegaly MSK: Normal gait; no clubbing/cyanosis Psychiatric: Appropriate affect; alert and oriented x3 Lymphatic: No palpable cervical or axillary lymphadenopathy  Results for orders placed or performed during the hospital encounter of 12/14/19 (from the past 48 hour(s))  CBC     Status: None   Collection Time: 12/14/19 11:13 PM  Result Value Ref Range   WBC 8.3 4.0 - 10.5 K/uL   RBC 4.68 4.22 - 5.81 MIL/uL   Hemoglobin 14.9 13.0 - 17.0 g/dL   HCT 43.1 39 - 52 %   MCV 92.1 80.0 - 100.0 fL   MCH 31.8 26.0 - 34.0 pg   MCHC 34.6 30.0 - 36.0 g/dL   RDW 12.2 11.5 - 15.5 %   Platelets 273 150 - 400 K/uL   nRBC 0.0 0.0 - 0.2 %    Comment: Performed at [facility], [place] [address] [place],  [address]  Comprehensive metabolic panel     Status: Abnormal   Collection Time: 12/15/19  1:03 AM  Result Value Ref Range    Sodium 135 135 - 145 mmol/L   Potassium 4.1 3.5 - 5.1 mmol/L   Chloride 101 98 - 111 mmol/L   CO2 25 22 - 32 mmol/L   Glucose, Bld 105 (H) 70 - 99 mg/dL    Comment: Glucose reference range applies only to samples taken after fasting for at least 8 hours.   BUN 13 8 - 23 mg/dL   Creatinine, Ser 0.97 0.61 - 1.24 mg/dL   Calcium 9.2 8.9 - 10.3 mg/dL   Total Protein 6.4 (L) 6.5 - 8.1 g/dL   Albumin 3.7 3.5 -  5.0 g/dL   AST 176 (H) 15 - 41 U/L   ALT 75 (H) 0 - 44 U/L   Alkaline Phosphatase 52 38 - 126 U/L   Total Bilirubin 0.5 0.3 - 1.2 mg/dL   GFR, Estimated >60 >60 mL/min    Comment: (NOTE) Calculated using the CKD-EPI Creatinine Equation (2021)    Anion gap 9 5 - 15    Comment: Performed at [name] [address], [place], [place] [address]  Troponin I (High Sensitivity)     Status: None   Collection Time: 12/15/19  1:46 AM  Result Value Ref Range   Troponin I (High Sensitivity) 6 <18 ng/L    Comment: (NOTE) Elevated high sensitivity troponin I (hsTnI) values and significant  changes across serial measurements may suggest ACS but many other  chronic and acute conditions are known to elevate hsTnI results.  Refer to the "Links" section for chest pain algorithms and additional  guidance. Performed at [facility], [place] [address] [place], [place] [address]   Resp Panel by RT-PCR (Flu A&B, Covid) Nasopharyngeal Swab     Status: None   Collection Time: 12/15/19  3:11 AM   Specimen: Nasopharyngeal Swab; Nasopharyngeal(NP) swabs in vial transport medium  Result Value Ref Range   SARS Coronavirus 2 by RT PCR NEGATIVE NEGATIVE    Comment: (NOTE) SARS-CoV-2 target nucleic acids are NOT DETECTED.  The SARS-CoV-2 RNA is generally detectable in upper respiratory specimens during the acute phase of infection. The lowest concentration of SARS-CoV-2 viral copies this assay can detect is 138 copies/mL. A negative result does not preclude SARS-Cov-2 infection and should  not be used as the sole basis for treatment or other patient management decisions. A negative result may occur with  improper specimen collection/handling, submission of specimen other than nasopharyngeal swab, presence of viral mutation(s) within the areas targeted by this assay, and inadequate number of viral copies(<138 copies/mL). A negative result must be combined with clinical observations, patient history, and epidemiological information. The expected result is Negative.  Fact Sheet for Patients:  EntrepreneurPulse.com.au  Fact Sheet for Healthcare Providers:  IncredibleEmployment.be  This test is no t yet approved or cleared by the [place] FDA and  has been authorized for detection and/or diagnosis of SARS-CoV-2 by FDA under an Emergency Use Authorization (EUA). This EUA will remain  in effect (meaning this test can be used) for the duration of the COVID-19 declaration under Section 564(b)(1) of the Act, 21 U.S.C.section 360bbb-3(b)(1), unless the authorization is terminated  or revoked sooner.       Influenza A by PCR NEGATIVE NEGATIVE   Influenza B by PCR NEGATIVE NEGATIVE    Comment: (NOTE) The Xpert Xpress SARS-CoV-2/FLU/RSV plus assay is intended as an aid in the diagnosis of influenza from Nasopharyngeal swab specimens and should not be used as a sole basis for treatment. Nasal washings and aspirates are unacceptable for Xpert Xpress SARS-CoV-2/FLU/RSV testing.  Fact Sheet for Patients: EntrepreneurPulse.com.au  Fact Sheet for Healthcare Providers: IncredibleEmployment.be  This test is not yet approved or cleared by the [place] FDA and has been authorized for detection and/or diagnosis of SARS-CoV-2 by FDA under an Emergency Use Authorization (EUA). This EUA will remain in effect (meaning this test can be used) for the duration of the COVID-19 declaration under Section 564(b)(1)  of the Act, 21 U.S.C. section 360bbb-3(b)(1), unless the authorization is terminated or revoked.  Performed at [facility], [place],  [place] [address]   Lipase, blood     Status: None   Collection Time: 12/15/19  6:26 AM  Result Value Ref Range   Lipase 45 11 - 51 U/L    Comment: Performed at [facility] [address], [place], [place] [address]  Comprehensive metabolic panel     Status: Abnormal   Collection Time: 12/15/19  6:26 AM  Result Value Ref Range   Sodium 134 (L) 135 - 145 mmol/L   Potassium 4.2 3.5 - 5.1 mmol/L   Chloride 100 98 - 111 mmol/L   CO2 24 22 - 32 mmol/L   Glucose, Bld 100 (H) 70 - 99 mg/dL    Comment: Glucose reference range applies only to samples taken after fasting for at least 8 hours.   BUN 14 8 - 23 mg/dL   Creatinine, Ser 1.02 0.61 - 1.24 mg/dL   Calcium 9.4 8.9 - 10.3 mg/dL   Total Protein 6.9 6.5 - 8.1 g/dL   Albumin 3.9 3.5 - 5.0 g/dL   AST 148 (H) 15 - 41 U/L   ALT 79 (H) 0 - 44 U/L   Alkaline Phosphatase 55 38 - 126 U/L   Total Bilirubin 0.8 0.3 - 1.2 mg/dL   GFR, Estimated >60 >60 mL/min    Comment: (NOTE) Calculated using the CKD-EPI Creatinine Equation (2021)    Anion gap 10 5 - 15    Comment: Performed at [facility] [address], [place], [place] [address]  Troponin I (High Sensitivity)     Status: None   Collection Time: 12/15/19  8:17 AM  Result Value Ref Range   Troponin I (High Sensitivity) 7 <18 ng/L    Comment: (NOTE) Elevated high sensitivity troponin I (hsTnI) values and significant  changes across serial measurements may suggest ACS but many other  chronic and acute conditions are known to elevate hsTnI results.  Refer to the "Links" section for chest pain algorithms and additional  guidance. Performed at [facility], [place] [address], [place], [place] [address]   CK     Status: None   Collection Time: 12/15/19  8:17 AM  Result Value Ref Range   Total CK 155 49.0 - 397.0  U/L    Comment: Performed at [facility], [name] [address], [place], [place] [address]   DG Chest 2 View  Result Date: 12/14/2019 CLINICAL DATA:  Chest pain, left lower. EXAM: CHEST - 2 VIEW COMPARISON:  Chest x-ray 12/28/2009 FINDINGS: The heart size and mediastinal contours are within normal limits. Aortic arch calcifications. Right base airspace opacity. No focal consolidation. No pulmonary edema. No pleural effusion. No pneumothorax. Persistent elevation of the left hemidiaphragm. No acute osseous abnormality. Multilevel degenerative changes of the spine with a midthoracic vertebral body demonstrating vertebral body height loss. IMPRESSION: 1. Right base airspace opacity likely represents compressive changes. Overlying infection/inflammation not excluded. 2. Age-indeterminate midthoracic vertebral body compression fracture. Correlate with point tenderness to evaluate for acute component. Electronically Signed   By: Iven Finn M.D.   On: 12/14/2019 23:45   ECHOCARDIOGRAM COMPLETE  Result Date: 12/15/2019    ECHOCARDIOGRAM REPORT   Patient Name:   REGINALDO HAZARD Date of Exam: 12/15/2019 Medical Rec #:  [id number]       Height:       67.0 in Accession #:    [id number]      Weight:       205.7 lb Date of Birth:  10-17-1950  BSA:          2.046 m Patient Age:    75 years        BP:           148/84 mmHg Patient Gender: M               HR:           65 bpm. Exam Location:  Inpatient Procedure: 2D Echo, Cardiac Doppler and Color Doppler Indications:    R07.9* Chest pain, unspecified  History:        Patient has prior history of Echocardiogram examinations, most                 recent 12/21/2009.  Sonographer:    Merrie Roof RDCS Referring Phys: [id number] [place]  1. Left ventricular ejection fraction, by estimation, is 60 to 65%. The left ventricle has normal function. The left ventricle has no regional wall motion abnormalities. There is mild concentric left ventricular  hypertrophy. Left ventricular diastolic parameters are consistent with Grade II diastolic dysfunction (pseudonormalization). Elevated left atrial pressure.  2. Right ventricular systolic function is normal. The right ventricular size is mildly enlarged. There is normal pulmonary artery systolic pressure.  3. Left atrial size was severely dilated.  4. Right atrial size was moderately dilated.  5. The mitral valve is normal in structure. No evidence of mitral valve regurgitation. No evidence of mitral stenosis.  6. The aortic valve is normal in structure. Aortic valve regurgitation is not visualized. Mild aortic valve sclerosis is present, with no evidence of aortic valve stenosis.  7. The inferior vena cava is normal in size with greater than 50% respiratory variability, suggesting right atrial pressure of 3 mmHg. Comparison(s): Prior images unable to be directly viewed, comparison made by report only. The left atrium is now severely dilated and there is evidence of worsened diastolic function (increased left atrial pressure). FINDINGS  Left Ventricle: Left ventricular ejection fraction, by estimation, is 60 to 65%. The left ventricle has normal function. The left ventricle has no regional wall motion abnormalities. The left ventricular internal cavity size was normal in size. There is  mild concentric left ventricular hypertrophy. Left ventricular diastolic parameters are consistent with Grade II diastolic dysfunction (pseudonormalization). Elevated left atrial pressure. Right Ventricle: The right ventricular size is mildly enlarged. No increase in right ventricular wall thickness. Right ventricular systolic function is normal. There is normal pulmonary artery systolic pressure. The tricuspid regurgitant velocity is 2.24  m/s, and with an assumed right atrial pressure of 3 mmHg, the estimated right ventricular systolic pressure is 14.7 mmHg. Left Atrium: Left atrial size was severely dilated. Right Atrium: Right  atrial size was moderately dilated. Pericardium: There is no evidence of pericardial effusion. Mitral Valve: The mitral valve is normal in structure. Mild mitral annular calcification. No evidence of mitral valve regurgitation. No evidence of mitral valve stenosis. Tricuspid Valve: The tricuspid valve is normal in structure. Tricuspid valve regurgitation is trivial. No evidence of tricuspid stenosis. Aortic Valve: The aortic valve is normal in structure. Aortic valve regurgitation is not visualized. Mild aortic valve sclerosis is present, with no evidence of aortic valve stenosis. Pulmonic Valve: The pulmonic valve was normal in structure. Pulmonic valve regurgitation is not visualized. No evidence of pulmonic stenosis. Aorta: The aortic root is normal in size and structure. Venous: The inferior vena cava is normal in size with greater than 50% respiratory variability, suggesting right atrial pressure of 3 mmHg. IAS/Shunts:  No atrial level shunt detected by color flow Doppler.  LEFT VENTRICLE PLAX 2D LVIDd:         4.50 cm  Diastology LVIDs:         3.20 cm  LV e' medial:    4.46 cm/s LV PW:         0.90 cm  LV E/e' medial:  23.3 LV IVS:        1.20 cm  LV e' lateral:   6.42 cm/s LVOT diam:     2.10 cm  LV E/e' lateral: 16.2 LV SV:         104 LV SV Index:   51 LVOT Area:     3.46 cm  RIGHT VENTRICLE RV Basal diam:  4.80 cm RV Mid diam:    4.30 cm LEFT ATRIUM              Index       RIGHT ATRIUM           Index LA diam:        5.00 cm  2.44 cm/m  RA Area:     22.20 cm LA Vol (A2C):   148.0 ml 72.32 ml/m RA Volume:   80.90 ml  39.53 ml/m LA Vol (A4C):   91.1 ml  44.52 ml/m LA Biplane Vol: 120.0 ml 58.64 ml/m  AORTIC VALVE LVOT Vmax:   129.00 cm/s LVOT Vmean:  90.400 cm/s LVOT VTI:    0.301 m  AORTA Ao Root diam: 3.40 cm Ao Asc diam:  3.20 cm MITRAL VALVE                TRICUSPID VALVE MV Area (PHT): 3.83 cm     TR Peak grad:   20.1 mmHg MV Decel Time: 198 msec     TR Vmax:        224.00 cm/s MV E velocity:  104.00 cm/s MV A velocity: 89.50 cm/s   SHUNTS MV E/A ratio:  1.16         Systemic VTI:  0.30 m                             Systemic Diam: 2.10 cm Dani Gobble Croitoru MD Electronically signed by Sanda Klein MD Signature Date/Time: 12/15/2019/10:58:08 AM    Final    US Abdomen Limited RUQ (LIVER/GB)  Result Date: 12/15/2019 CLINICAL DATA:  Elevated transaminase levels. EXAM: ULTRASOUND ABDOMEN LIMITED RIGHT UPPER QUADRANT COMPARISON:  None. FINDINGS: Gallbladder: Mobile stones and sludge are demonstrated in the gallbladder. No gallbladder wall thickening or edema. Murphy's sign is negative. Common bile duct: Diameter: 3 mm, normal Liver: Multiple circumscribed cysts are demonstrated in the liver, largest in the left lobe measuring 3 cm maximal diameter. Parenchyma are otherwise unremarkable. Portal vein is patent on color Doppler imaging with normal direction of blood flow towards the liver. Other: None. IMPRESSION: 1. Cholelithiasis and sludge in the gallbladder. No additional changes to suggest acute cholecystitis. 2. Multiple benign-appearing hepatic cysts. Electronically Signed   By: Lucienne Capers M.D.   On: 12/15/2019 04:43   Assessment/Plan Weight gain, leg swelling  Depression, anxiety  PMH CVA on plavix - hold plavix   Chest pain - Troponin normal, EKD with RBBB, echo pending RUQ pain, mildly elevated LFT's   Patient is a very pleasant 69 y/o M. Given above history of recurrent stabbing epigastric vs chest pain that usually resolves with tums, I suspect the patient has symptomatic  cholelithiasis without signs of cholecystitis. Patient states he has a history of similar pain that goes away with nitroglycerin as well, making it difficult to differentiate cardiac chest pain from epigastric pain. Thus far cardaic workup has been negative for ACS and ultrasound significant for a 1 cm gallstone and sludge. No fever or leukocytosis. I suspect HIDA scan will be negative but ultimately patient would  benefit from non-urgent, elective cholecystectomy for biliary colic. He stated during my exam that he would like to avoid surgery if possible, as he is trying to have his rotator cuff surgery. CCS will follow results of HIDA.    Jill Alexanders, PA-C [facility] Please see Amion for pager number during day hours 7:00am-4:30pm 12/15/2019, 11:11 AM

## 2019-12-15 NOTE — Progress Notes (Signed)
PROGRESS NOTE  [name] MPN:[id number] DOB: Oct 02, 1950   PCP: Orpah Melter, MD  Patient is from: Home  DOA: 12/14/2019 LOS: 0  Chief complaints: Chest pain  Brief Narrative / Interim history: 69 year old male with history of CVA, HTN, anxiety, depression, GERD and left shoulder pain brought to ED by EMS due to acute chest pain that woke him up from sleep.  Reportedly, chest pain resolved with nitro prior to arrival to ED. he also has nausea, weight gain and BLE edema recently.   In the ED, afebrile.  Hemodynamically stable.  97% on RA.  CBC within normal.  Mildly elevated liver enzymes on CMP.  Initial troponin negative.  EKG with sinus rhythm, RBBB, LAFB, LVH and PRWP.  Two view CXR with right base airspace opacity likely represents compressive.  RUQ ultrasound and TTE ordered.  Admitted for chest pain work-up.  RUQ ultrasound with cholelithiasis and biliary sludge but no signs of cholecystitis.  TTE with normal LVEF, G2-DD, severe LAE and moderate RAE on TTE but no RWMA. BNP within normal.  Subjective: Seen and examined earlier this morning.  No major events overnight of this morning.  Continues to endorse pain across his lower chest mainly on the right.  Describes the pain as pressure-like.  No radiation to his back or his shoulders.  Also endorses nausea.  Denies shortness of breath or diarrhea.  He denies exertional chest pain  Objective: Vitals:   12/15/19 0652 12/15/19 0700 12/15/19 0702 12/15/19 1212  BP: (!) 142/89 (!) 144/85 131/85 (!) 182/98  Pulse: 68 (!) 58 65 67  Resp: (!) 22 17 19  (!) 23  Temp:    98.5 F (36.9 C)  TempSrc:    Oral  SpO2: 97% 94% 93% 94%  Weight:      Height:       No intake or output data in the 24 hours ending 12/15/19 1417 Filed Weights   12/14/19 2334 12/15/19 0645  Weight: 77.1 kg 93.3 kg    Examination:  GENERAL: No apparent distress.  Nontoxic. HEENT: MMM.  Vision and hearing grossly intact.  NECK: Supple.  No apparent JVD.   RESP: On RA.  No IWOB.  Fair aeration bilaterally. CVS:  RRR. Heart sounds normal.  ABD/GI/GU: BS+. Abd soft.  Epigastric and RUQ tenderness.  Percell Miller? MSK/EXT:  Moves extremities. No apparent deformity.  1+ edema bilaterally. SKIN: no apparent skin lesion or wound NEURO: Awake, alert and oriented appropriately.  No apparent focal neuro deficit. PSYCH: Calm. Normal affect.  Procedures:  None  Microbiology summarized: [id number] and influenza PCR nonreactive.  Assessment & Plan: Atypical chest pain/epigastric/RUQ pain: Cardiac work-up reassuring so far.  Initial EKG, serial troponin and BNP and TTE without acute finding.  TTE with normal LVEF, G2-DD, severe LAE and moderate RAE on TTE but no RWMA.  He has bilateral trace to 1+ edema but no orthopnea, PND or crackles on exam.  He has epigastric and RUQ tenderness.  Mild elevated LFT in a pattern consistent with alcohol or rhabdo but he denies alcohol and CKs within normal.  RUQ  [place] with cholelithiasis and biliary sludge.  He has no fever or leukocytosis to suggest infectious process. -Trial of IV Lasix 40 mg once -Continue Protonix 40 mg daily -GI cocktail as needed -Could benefit from outpatient cardiac evaluation such as stress test -HIDA scan to exclude cholecystitis.  Elevated transaminases: AST 176.  ALT 75.  Pattern consistent with alcohol or rhabdo but he denies alcohol and CKs within  normal.  RUQ ultrasound as above.  Acute hepatitis panel nonreactive.  Improving. -Continue trending -HIDA scan  Weight-gain and leg swelling: TTE as above.  He denies shortness of breath, PND or orthopnea.  No signs of fluid overload on CXR.  BNP within normal.  Not anemic.  Albumin within normal.  Edema could be due to amlodipine.  Venous insufficiency? -Trial of IV Lasix 40 mg once -Discontinue amlodipine -Check TSH and urinalysis -Monitor fluid status and renal function.  Uncontrolled hypertension: BP as high as 180s/90s.  -Resume home  medications -Discontinued amlodipine in the setting of LE edema -Trial of diuretics  Anxiety/depression/insomnia/tremor: Stable -Continue home medications  History of CVA -Hold home Plavix but has already received a dose this morning. -Hold home statin in the setting of LFT.  Left shoulder pain/rotator cuff injury -Has upcoming surgical appointment.   Body mass index is 32.22 kg/m.         DVT prophylaxis:  enoxaparin (LOVENOX) injection 40 mg Start: 12/15/19 0600  Code Status: Full code Family Communication: Updated patient's significant other at bedside. Status is: Observation  The patient will require care spanning > 2 midnights and should be moved to inpatient because: Hemodynamically unstable, Ongoing diagnostic testing needed not appropriate for outpatient work up, Unsafe d/c plan, IV treatments appropriate due to intensity of illness or inability to take PO and Inpatient level of care appropriate due to severity of illness  Dispo: The patient is from: Home              Anticipated d/c is to: Home              Anticipated d/c date is: 1 day              Patient currently is not medically stable to d/c.       Consultants:  General surgery   Sch Meds:  Scheduled Meds: . amLODipine  10 mg Oral Daily  . busPIRone  10 mg Oral TID  . clopidogrel  75 mg Oral Daily  . enoxaparin (LOVENOX) injection  40 mg Subcutaneous Q24H  . irbesartan  150 mg Oral Daily  . metoprolol succinate  25 mg Oral Daily  . pantoprazole  40 mg Oral Daily  . PARoxetine  37.5 mg Oral QHS  . primidone  250 mg Oral BID  . QUEtiapine  75 mg Oral QHS  . Suvorexant  10 mg Oral QHS  . [START ON 12/16/2019] Valbenazine Tosylate  80 mg Oral q AM   Continuous Infusions: PRN Meds:.acetaminophen, alum & mag hydroxide-simeth, nitroGLYCERIN, ondansetron (ZOFRAN) IV  Antimicrobials: Anti-infectives (From admission, onward)   None       I have personally reviewed the following labs and  images: CBC: Recent Labs  Lab 12/14/19 2313  WBC 8.3  HGB 14.9  HCT 43.1  MCV 92.1  PLT 273   BMP &GFR Recent Labs  Lab 12/15/19 0103 12/15/19 0626  NA 135 134*  K 4.1 4.2  CL 101 100  CO2 25 24  GLUCOSE 105* 100*  BUN 13 14  CREATININE 0.97 1.02  CALCIUM 9.2 9.4   Estimated Creatinine Clearance: 76.5 mL/min (by C-G formula based on SCr of 1.02 mg/dL). Liver & Pancreas: Recent Labs  Lab 12/15/19 0103 12/15/19 0626  AST 176* 148*  ALT 75* 79*  ALKPHOS 52 55  BILITOT 0.5 0.8  PROT 6.4* 6.9  ALBUMIN 3.7 3.9   Recent Labs  Lab 12/15/19 0626  LIPASE 45   No results  for input(s): AMMONIA in the last 168 hours. Diabetic: No results for input(s): HGBA1C in the last 72 hours. No results for input(s): GLUCAP in the last 168 hours. Cardiac Enzymes: Recent Labs  Lab 12/15/19 0817  CKTOTAL 155   No results for input(s): PROBNP in the last 8760 hours. Coagulation Profile: No results for input(s): INR, PROTIME in the last 168 hours. Thyroid Function Tests: No results for input(s): TSH, T4TOTAL, FREET4, T3FREE, THYROIDAB in the last 72 hours. Lipid Profile: No results for input(s): CHOL, HDL, LDLCALC, TRIG, CHOLHDL, LDLDIRECT in the last 72 hours. Anemia Panel: No results for input(s): VITAMINB12, FOLATE, FERRITIN, TIBC, IRON, RETICCTPCT in the last 72 hours. Urine analysis: No results found for: COLORURINE, APPEARANCEUR, LABSPEC, PHURINE, GLUCOSEU, HGBUR, BILIRUBINUR, KETONESUR, PROTEINUR, UROBILINOGEN, NITRITE, LEUKOCYTESUR Sepsis Labs: Invalid input(s): PROCALCITONIN, [place]  Microbiology: Recent Results (from the past 240 hour(s))  Resp Panel by RT-PCR (Flu A&B, Covid) Nasopharyngeal Swab     Status: None   Collection Time: 12/15/19  3:11 AM   Specimen: Nasopharyngeal Swab; Nasopharyngeal(NP) swabs in vial transport medium  Result Value Ref Range Status   SARS Coronavirus 2 by RT PCR NEGATIVE NEGATIVE Final    Comment: (NOTE) SARS-CoV-2 target  nucleic acids are NOT DETECTED.  The SARS-CoV-2 RNA is generally detectable in upper respiratory specimens during the acute phase of infection. The lowest concentration of SARS-CoV-2 viral copies this assay can detect is 138 copies/mL. A negative result does not preclude SARS-Cov-2 infection and should not be used as the sole basis for treatment or other patient management decisions. A negative result may occur with  improper specimen collection/handling, submission of specimen other than nasopharyngeal swab, presence of viral mutation(s) within the areas targeted by this assay, and inadequate number of viral copies(<138 copies/mL). A negative result must be combined with clinical observations, patient history, and epidemiological information. The expected result is Negative.  Fact Sheet for Patients:  EntrepreneurPulse.com.au  Fact Sheet for Healthcare Providers:  IncredibleEmployment.be  This test is no t yet approved or cleared by the [place] FDA and  has been authorized for detection and/or diagnosis of SARS-CoV-2 by FDA under an Emergency Use Authorization (EUA). This EUA will remain  in effect (meaning this test can be used) for the duration of the COVID-19 declaration under Section 564(b)(1) of the Act, 21 U.S.C.section 360bbb-3(b)(1), unless the authorization is terminated  or revoked sooner.       Influenza A by PCR NEGATIVE NEGATIVE Final   Influenza B by PCR NEGATIVE NEGATIVE Final    Comment: (NOTE) The Xpert Xpress SARS-CoV-2/FLU/RSV plus assay is intended as an aid in the diagnosis of influenza from Nasopharyngeal swab specimens and should not be used as a sole basis for treatment. Nasal washings and aspirates are unacceptable for Xpert Xpress SARS-CoV-2/FLU/RSV testing.  Fact Sheet for Patients: EntrepreneurPulse.com.au  Fact Sheet for Healthcare  Providers: IncredibleEmployment.be  This test is not yet approved or cleared by the [place] FDA and has been authorized for detection and/or diagnosis of SARS-CoV-2 by FDA under an Emergency Use Authorization (EUA). This EUA will remain in effect (meaning this test can be used) for the duration of the COVID-19 declaration under Section 564(b)(1) of the Act, 21 U.S.C. section 360bbb-3(b)(1), unless the authorization is terminated or revoked.  Performed at [facility], [place] [address] [place], [place] [address]     Radiology Studies: DG Chest 2 View  Result Date: 12/14/2019 CLINICAL DATA:  Chest pain, left lower. EXAM: CHEST - 2  VIEW COMPARISON:  Chest x-ray 12/28/2009 FINDINGS: The heart size and mediastinal contours are within normal limits. Aortic arch calcifications. Right base airspace opacity. No focal consolidation. No pulmonary edema. No pleural effusion. No pneumothorax. Persistent elevation of the left hemidiaphragm. No acute osseous abnormality. Multilevel degenerative changes of the spine with a midthoracic vertebral body demonstrating vertebral body height loss. IMPRESSION: 1. Right base airspace opacity likely represents compressive changes. Overlying infection/inflammation not excluded. 2. Age-indeterminate midthoracic vertebral body compression fracture. Correlate with point tenderness to evaluate for acute component. Electronically Signed   By: Iven Finn M.D.   On: 12/14/2019 23:45   ECHOCARDIOGRAM COMPLETE  Result Date: 12/15/2019    ECHOCARDIOGRAM REPORT   Patient Name:   [name] Date of Exam: 12/15/2019 Medical Rec #:  [id number]       Height:       67.0 in Accession #:    [id number]      Weight:       205.7 lb Date of Birth:  10-01-1950       BSA:          2.046 m Patient Age:    65 years        BP:           148/84 mmHg Patient Gender: M               HR:           65 bpm. Exam Location:  Inpatient Procedure: 2D Echo, Cardiac  Doppler and Color Doppler Indications:    R07.9* Chest pain, unspecified  History:        Patient has prior history of Echocardiogram examinations, most                 recent 12/21/2009.  Sonographer:    Merrie Roof RDCS Referring Phys: [id number] [place]  1. Left ventricular ejection fraction, by estimation, is 60 to 65%. The left ventricle has normal function. The left ventricle has no regional wall motion abnormalities. There is mild concentric left ventricular hypertrophy. Left ventricular diastolic parameters are consistent with Grade II diastolic dysfunction (pseudonormalization). Elevated left atrial pressure.  2. Right ventricular systolic function is normal. The right ventricular size is mildly enlarged. There is normal pulmonary artery systolic pressure.  3. Left atrial size was severely dilated.  4. Right atrial size was moderately dilated.  5. The mitral valve is normal in structure. No evidence of mitral valve regurgitation. No evidence of mitral stenosis.  6. The aortic valve is normal in structure. Aortic valve regurgitation is not visualized. Mild aortic valve sclerosis is present, with no evidence of aortic valve stenosis.  7. The inferior vena cava is normal in size with greater than 50% respiratory variability, suggesting right atrial pressure of 3 mmHg. Comparison(s): Prior images unable to be directly viewed, comparison made by report only. The left atrium is now severely dilated and there is evidence of worsened diastolic function (increased left atrial pressure). FINDINGS  Left Ventricle: Left ventricular ejection fraction, by estimation, is 60 to 65%. The left ventricle has normal function. The left ventricle has no regional wall motion abnormalities. The left ventricular internal cavity size was normal in size. There is  mild concentric left ventricular hypertrophy. Left ventricular diastolic parameters are consistent with Grade II diastolic dysfunction (pseudonormalization).  Elevated left atrial pressure. Right Ventricle: The right ventricular size is mildly enlarged. No increase in right ventricular wall thickness. Right ventricular systolic function is normal. There  is normal pulmonary artery systolic pressure. The tricuspid regurgitant velocity is 2.24  m/s, and with an assumed right atrial pressure of 3 mmHg, the estimated right ventricular systolic pressure is 39.7 mmHg. Left Atrium: Left atrial size was severely dilated. Right Atrium: Right atrial size was moderately dilated. Pericardium: There is no evidence of pericardial effusion. Mitral Valve: The mitral valve is normal in structure. Mild mitral annular calcification. No evidence of mitral valve regurgitation. No evidence of mitral valve stenosis. Tricuspid Valve: The tricuspid valve is normal in structure. Tricuspid valve regurgitation is trivial. No evidence of tricuspid stenosis. Aortic Valve: The aortic valve is normal in structure. Aortic valve regurgitation is not visualized. Mild aortic valve sclerosis is present, with no evidence of aortic valve stenosis. Pulmonic Valve: The pulmonic valve was normal in structure. Pulmonic valve regurgitation is not visualized. No evidence of pulmonic stenosis. Aorta: The aortic root is normal in size and structure. Venous: The inferior vena cava is normal in size with greater than 50% respiratory variability, suggesting right atrial pressure of 3 mmHg. IAS/Shunts: No atrial level shunt detected by color flow Doppler.  LEFT VENTRICLE PLAX 2D LVIDd:         4.50 cm  Diastology LVIDs:         3.20 cm  LV e' medial:    4.46 cm/s LV PW:         0.90 cm  LV E/e' medial:  23.3 LV IVS:        1.20 cm  LV e' lateral:   6.42 cm/s LVOT diam:     2.10 cm  LV E/e' lateral: 16.2 LV SV:         104 LV SV Index:   51 LVOT Area:     3.46 cm  RIGHT VENTRICLE RV Basal diam:  4.80 cm RV Mid diam:    4.30 cm LEFT ATRIUM              Index       RIGHT ATRIUM           Index LA diam:        5.00 cm  2.44  cm/m  RA Area:     22.20 cm LA Vol (A2C):   148.0 ml 72.32 ml/m RA Volume:   80.90 ml  39.53 ml/m LA Vol (A4C):   91.1 ml  44.52 ml/m LA Biplane Vol: 120.0 ml 58.64 ml/m  AORTIC VALVE LVOT Vmax:   129.00 cm/s LVOT Vmean:  90.400 cm/s LVOT VTI:    0.301 m  AORTA Ao Root diam: 3.40 cm Ao Asc diam:  3.20 cm MITRAL VALVE                TRICUSPID VALVE MV Area (PHT): 3.83 cm     TR Peak grad:   20.1 mmHg MV Decel Time: 198 msec     TR Vmax:        224.00 cm/s MV E velocity: 104.00 cm/s MV A velocity: 89.50 cm/s   SHUNTS MV E/A ratio:  1.16         Systemic VTI:  0.30 m                             Systemic Diam: 2.10 cm Dani Gobble Croitoru MD Electronically signed by Sanda Klein MD Signature Date/Time: 12/15/2019/10:58:08 AM    Final    US Abdomen Limited RUQ (LIVER/GB)  Result Date: 12/15/2019 CLINICAL DATA:  Elevated  transaminase levels. EXAM: ULTRASOUND ABDOMEN LIMITED RIGHT UPPER QUADRANT COMPARISON:  None. FINDINGS: Gallbladder: Mobile stones and sludge are demonstrated in the gallbladder. No gallbladder wall thickening or edema. Murphy's sign is negative. Common bile duct: Diameter: 3 mm, normal Liver: Multiple circumscribed cysts are demonstrated in the liver, largest in the left lobe measuring 3 cm maximal diameter. Parenchyma are otherwise unremarkable. Portal vein is patent on color Doppler imaging with normal direction of blood flow towards the liver. Other: None. IMPRESSION: 1. Cholelithiasis and sludge in the gallbladder. No additional changes to suggest acute cholecystitis. 2. Multiple benign-appearing hepatic cysts. Electronically Signed   By: Lucienne Capers M.D.   On: 12/15/2019 04:43      Solace Wendorff T. [place]  If 7PM-7AM, please contact night-coverage [email or web address] 12/15/2019, 2:17 PM

## 2019-12-15 NOTE — ED Notes (Signed)
Ultrasound tech in with patient

## 2019-12-15 NOTE — ED Notes (Signed)
Pt calling out with c/o nausea. Pt also reports abd pain at this time. Will medicate.

## 2019-12-15 NOTE — H&P (Signed)
History and Physical    [name] GMW:[id number] DOB: [birth year] DOA: 12/14/2019  PCP: Orpah Melter, MD   Patient coming from: Home   Chief Complaint: Chest pain   HPI: [name] is a 69 y.o. male with medical history significant for hypertension, depression, anxiety, and history of CVA, now presenting to the emergency department with chest pain.  Patient reports that he went to bed in his usual state of health and woke with severe chest pain.  Pain was localized to the mid chest and not associated with shortness of breath, nausea, or diaphoresis.  Patient took multiple doses of Tums, called EMS, was given nitroglycerin, and symptoms resolved prior to arrival in the ED.  He reports recent weight gain despite reducing his caloric intake, and has recently developed bilateral lower extremity swelling but no leg tenderness, cough, hemoptysis, or shortness of breath.  Denies any recent fevers or chills.  Denies any alcohol use, vomiting, or diarrhea.  ED Course: Upon arrival to the ED, patient is found to be afebrile, saturating well on room air, bradycardic in the 50s, and with stable blood pressure.  EKG features sinus rhythm with RBBB and LAFB.  Chest x-ray is notable for opacity at the right base that is likely atelectasis, as well as age-indeterminate mid thoracic vertebral body compression fracture.  Chemistry panel is notable for AST of 176 and ALT of 75, previously normal.  CBC is unremarkable.  High-sensitivity troponin is normal.  COVID-19 screening test and second troponin are pending.  Review of Systems:  All other systems reviewed and apart from HPI, are negative.  Past Medical History:  Diagnosis Date  . Depression   . Frequent urination   . GERD (gastroesophageal reflux disease)   . Herpes   . Hyperlipidemia   . Hypertension   . Memory loss   . Obesity   . Stroke ([place])   . Urgency of urination     Past Surgical History:  Procedure Laterality Date  . disckec     . SPINAL FUSION    . VASECTOMY      Social History:   reports that he has quit smoking. He has never used smokeless tobacco. He reports that he does not drink alcohol and does not use drugs.  Allergies  Allergen Reactions  . Acyclovir And Related   . Aspirin Other (See Comments)    Dizziness, nausea and vomiting Balance issues  . Lisinopril Other (See Comments)    Critical Cough  . Lithium Other (See Comments)    Critical Per pt reaction unknown  . Oxycodone Nausea And Vomiting  . Oxycodone-Acetaminophen Nausea And Vomiting    Also causes dizziness    Family History  Problem Relation Age of Onset  . Hypertension Mother   . Hypertension Father   . Hypertension Sister   . Hypertension Brother   . Cancer Maternal Aunt   . Cancer - Ovarian Maternal Grandmother   . Diabetes Mellitus I Paternal Grandmother   . Hypertension Paternal Grandfather   . Cancer Maternal Aunt      Prior to Admission medications   Medication Sig Start Date End Date Taking? Authorizing Provider  amLODipine (NORVASC) 10 MG tablet Take 10 mg by mouth daily.    [provider]  busPIRone (BUSPAR) 10 MG tablet Take 1 tablet (10 mg total) by mouth 3 (three) times daily. 08/29/19   Nevada Crane, MD  candesartan (ATACAND) 16 MG tablet Take 32 mg by mouth daily.  10/04/18  [provider]  clopidogrel (PLAVIX) 75 MG tablet Take 1 tablet (75 mg total) by mouth daily. 11/11/15   Dennie Bible, NP  Fluocinonide 0.1 % CREA APPLY 2-3 GRAMS TO AFFECTED AREAS 3-4 TIMES DAILY (1 GRAM = 1 DIME SIZE) 08/04/16   [provider]  lovastatin (MEVACOR) 10 MG tablet Take 10 mg by mouth at bedtime.    [provider]  meloxicam (MOBIC) 7.5 MG tablet Take 7.5 mg by mouth daily. 08/16/18   [provider]  metoprolol succinate (TOPROL-XL) 25 MG 24 hr tablet 25 mg.  10/04/18   [provider]  PARoxetine (PAXIL-CR) 37.5 MG 24 hr tablet TAKE 1 TABLET (37.5 MG TOTAL)  BY MOUTH AT BEDTIME. 11/20/19   [place] Crane, MD  primidone (MYSOLINE) 250 MG tablet TAKE 1 TABLET BY MOUTH TWICE A DAY 10/21/19   Frann Rider, NP  QUEtiapine (SEROQUEL) 25 MG tablet Take 3 tablets at bedtime (75 mg) 08/29/19   [place] Crane, MD  Suvorexant (BELSOMRA) 10 MG TABS Take 10 mg by mouth at bedtime. 08/29/19   [place] Crane, MD  valACYclovir (VALTREX) 1000 MG tablet Take 1,000 mg by mouth daily.    [provider]  Valbenazine Tosylate (INGREZZA) 80 MG CAPS Take 80 mg by mouth in the morning. 08/29/19   [place] Crane, MD    Physical Exam: Vitals:   12/15/19 0130 12/15/19 0145 12/15/19 0230 12/15/19 0245  BP: (!) 151/96 (!) 166/93 (!) 143/86 140/89  Pulse: 61 (!) 59 (!) 43 (!) 55  Resp: 17 17 (!) 21 20  Temp:      TempSrc:      SpO2: 92% 94% 96% 98%  Weight:      Height:        Constitutional: NAD, calm  Eyes: PERTLA, lids and conjunctivae normal ENMT: Mucous membranes are moist. Posterior pharynx clear of any exudate or lesions.   Neck: normal, supple, no masses, no thyromegaly Respiratory: no wheezing, no crackles. No accessory muscle use.  Cardiovascular: S1 & S2 heard, regular rate and rhythm. Bilateral LE swelling.   Abdomen: No distension, no tenderness, soft. Bowel sounds active.  Musculoskeletal: no clubbing / cyanosis. No joint deformity upper and lower extremities.   Skin: no significant rashes, lesions, ulcers. Warm, dry, well-perfused. Neurologic: CN 2-12 grossly intact. Sensation intact. Moving all extremities.  Psychiatric: Alert and oriented to person, place, and situation. Calm and cooperative.    Labs and Imaging on Admission: I have personally reviewed following labs and imaging studies  CBC: Recent Labs  Lab 12/14/19 2313  WBC 8.3  HGB 14.9  HCT 43.1  MCV 92.1  PLT 182   Basic Metabolic Panel: Recent Labs  Lab 12/15/19 0103  NA 135  K 4.1  CL 101  CO2 25  GLUCOSE 105*  BUN 13  CREATININE 0.97  CALCIUM 9.2    GFR: Estimated Creatinine Clearance: 76.3 mL/min (by C-G formula based on SCr of 0.97 mg/dL). Liver Function Tests: Recent Labs  Lab 12/15/19 0103  AST 176*  ALT 75*  ALKPHOS 52  BILITOT 0.5  PROT 6.4*  ALBUMIN 3.7   No results for input(s): LIPASE, AMYLASE in the last 168 hours. No results for input(s): AMMONIA in the last 168 hours. Coagulation Profile: No results for input(s): INR, PROTIME in the last 168 hours. Cardiac Enzymes: No results for input(s): CKTOTAL, CKMB, CKMBINDEX, TROPONINI in the last 168 hours. BNP (last 3 results) No results for input(s): PROBNP in the last 8760 hours. HbA1C:  No results for input(s): HGBA1C in the last 72 hours. CBG: No results for input(s): GLUCAP in the last 168 hours. Lipid Profile: No results for input(s): CHOL, HDL, LDLCALC, TRIG, CHOLHDL, LDLDIRECT in the last 72 hours. Thyroid Function Tests: No results for input(s): TSH, T4TOTAL, FREET4, T3FREE, THYROIDAB in the last 72 hours. Anemia Panel: No results for input(s): VITAMINB12, FOLATE, FERRITIN, TIBC, IRON, RETICCTPCT in the last 72 hours. Urine analysis: No results found for: COLORURINE, APPEARANCEUR, LABSPEC, PHURINE, GLUCOSEU, HGBUR, BILIRUBINUR, KETONESUR, PROTEINUR, UROBILINOGEN, NITRITE, LEUKOCYTESUR Sepsis Labs: @LABRCNTIP (procalcitonin:4,lacticidven:4) )No results found for this or any previous visit (from the past 240 hour(s)).   Radiological Exams on Admission: DG Chest 2 View  Result Date: 12/14/2019 CLINICAL DATA:  Chest pain, left lower. EXAM: CHEST - 2 VIEW COMPARISON:  Chest x-ray 12/28/2009 FINDINGS: The heart size and mediastinal contours are within normal limits. Aortic arch calcifications. Right base airspace opacity. No focal consolidation. No pulmonary edema. No pleural effusion. No pneumothorax. Persistent elevation of the left hemidiaphragm. No acute osseous abnormality. Multilevel degenerative changes of the spine with a midthoracic vertebral body  demonstrating vertebral body height loss. IMPRESSION: 1. Right base airspace opacity likely represents compressive changes. Overlying infection/inflammation not excluded. 2. Age-indeterminate midthoracic vertebral body compression fracture. Correlate with point tenderness to evaluate for acute component. Electronically Signed   By: Iven Finn M.D.   On: 12/14/2019 23:45    EKG: Independently reviewed. Sinus rhythm, RBBB, LAFB.   Assessment/Plan   1. Chest pain  - Presents with chest pain that woke him from sleep and resolved after taking TUMS and NTG  - EKG with RBBB and LAFB, CXR with suspected atelectasis and age-indeterminate thoracic compression fracture  - Initial HS troponin is normal  - Continue cardiac monitoring, check lipase, check second troponin, trial GI cocktail if pain recurs    2. Elevated transaminases  - AST is 176 and ALT 75 on admission, previously normal  - Denies alcohol use  - Check viral hepatitis panel and RUQ [place]    3. Weight-gain, leg swelling  - Patient reports recent weight gain and development of bilateral LE swelling despite decreased caloric intake  - Update echocardiogram   4. Depression, anxiety  - Plan to continue home medications pending pharmacy medication-reconciliation    5. History of CVA  - Continue Plavix, hold statin while trending LFTs     DVT prophylaxis: Lovenox  Code Status: Full  Family Communication: Discussed with patient  Disposition Plan:  Patient is from: Home  Anticipated d/c is to: Home  Anticipated d/c date is: 12/16/19 Patient currently: Pending additional blood work, echocardiogram, RUQ [place]  Consults called: none  Admission status: Observation     Vianne Bulls, MD [facility]  12/15/2019, 3:20 AM

## 2019-12-16 ENCOUNTER — Observation Stay (HOSPITAL_COMMUNITY): Payer: Medicare Other

## 2019-12-16 DIAGNOSIS — R079 Chest pain, unspecified: Secondary | ICD-10-CM | POA: Diagnosis not present

## 2019-12-16 DIAGNOSIS — B009 Herpesviral infection, unspecified: Secondary | ICD-10-CM | POA: Diagnosis present

## 2019-12-16 DIAGNOSIS — Z79899 Other long term (current) drug therapy: Secondary | ICD-10-CM | POA: Diagnosis not present

## 2019-12-16 DIAGNOSIS — R609 Edema, unspecified: Secondary | ICD-10-CM | POA: Diagnosis not present

## 2019-12-16 DIAGNOSIS — Z888 Allergy status to other drugs, medicaments and biological substances status: Secondary | ICD-10-CM | POA: Diagnosis not present

## 2019-12-16 DIAGNOSIS — E669 Obesity, unspecified: Secondary | ICD-10-CM | POA: Diagnosis not present

## 2019-12-16 DIAGNOSIS — Z791 Long term (current) use of non-steroidal anti-inflammatories (NSAID): Secondary | ICD-10-CM | POA: Diagnosis not present

## 2019-12-16 DIAGNOSIS — R0789 Other chest pain: Secondary | ICD-10-CM

## 2019-12-16 DIAGNOSIS — K8020 Calculus of gallbladder without cholecystitis without obstruction: Secondary | ICD-10-CM | POA: Diagnosis not present

## 2019-12-16 DIAGNOSIS — F418 Other specified anxiety disorders: Secondary | ICD-10-CM | POA: Diagnosis not present

## 2019-12-16 DIAGNOSIS — K8050 Calculus of bile duct without cholangitis or cholecystitis without obstruction: Secondary | ICD-10-CM | POA: Diagnosis not present

## 2019-12-16 DIAGNOSIS — F3341 Major depressive disorder, recurrent, in partial remission: Secondary | ICD-10-CM | POA: Diagnosis not present

## 2019-12-16 DIAGNOSIS — E785 Hyperlipidemia, unspecified: Secondary | ICD-10-CM | POA: Diagnosis not present

## 2019-12-16 DIAGNOSIS — Z6832 Body mass index (BMI) 32.0-32.9, adult: Secondary | ICD-10-CM | POA: Diagnosis not present

## 2019-12-16 DIAGNOSIS — R748 Abnormal levels of other serum enzymes: Secondary | ICD-10-CM | POA: Diagnosis not present

## 2019-12-16 DIAGNOSIS — I5032 Chronic diastolic (congestive) heart failure: Secondary | ICD-10-CM | POA: Diagnosis not present

## 2019-12-16 DIAGNOSIS — K8012 Calculus of gallbladder with acute and chronic cholecystitis without obstruction: Secondary | ICD-10-CM | POA: Diagnosis not present

## 2019-12-16 DIAGNOSIS — K810 Acute cholecystitis: Secondary | ICD-10-CM | POA: Diagnosis not present

## 2019-12-16 DIAGNOSIS — N179 Acute kidney failure, unspecified: Secondary | ICD-10-CM | POA: Diagnosis not present

## 2019-12-16 DIAGNOSIS — R1011 Right upper quadrant pain: Secondary | ICD-10-CM

## 2019-12-16 DIAGNOSIS — G2401 Drug induced subacute dyskinesia: Secondary | ICD-10-CM | POA: Diagnosis not present

## 2019-12-16 DIAGNOSIS — Z8673 Personal history of transient ischemic attack (TIA), and cerebral infarction without residual deficits: Secondary | ICD-10-CM | POA: Diagnosis not present

## 2019-12-16 DIAGNOSIS — I10 Essential (primary) hypertension: Secondary | ICD-10-CM | POA: Diagnosis not present

## 2019-12-16 DIAGNOSIS — Z981 Arthrodesis status: Secondary | ICD-10-CM | POA: Diagnosis not present

## 2019-12-16 DIAGNOSIS — K8010 Calculus of gallbladder with chronic cholecystitis without obstruction: Secondary | ICD-10-CM

## 2019-12-16 DIAGNOSIS — Z885 Allergy status to narcotic agent status: Secondary | ICD-10-CM | POA: Diagnosis not present

## 2019-12-16 DIAGNOSIS — Z20822 Contact with and (suspected) exposure to covid-19: Secondary | ICD-10-CM | POA: Diagnosis not present

## 2019-12-16 DIAGNOSIS — E876 Hypokalemia: Secondary | ICD-10-CM | POA: Diagnosis not present

## 2019-12-16 DIAGNOSIS — Z7902 Long term (current) use of antithrombotics/antiplatelets: Secondary | ICD-10-CM | POA: Diagnosis not present

## 2019-12-16 DIAGNOSIS — Z87891 Personal history of nicotine dependence: Secondary | ICD-10-CM | POA: Diagnosis not present

## 2019-12-16 DIAGNOSIS — R7401 Elevation of levels of liver transaminase levels: Secondary | ICD-10-CM | POA: Diagnosis not present

## 2019-12-16 DIAGNOSIS — N281 Cyst of kidney, acquired: Secondary | ICD-10-CM | POA: Diagnosis not present

## 2019-12-16 DIAGNOSIS — K811 Chronic cholecystitis: Secondary | ICD-10-CM

## 2019-12-16 DIAGNOSIS — Z91018 Allergy to other foods: Secondary | ICD-10-CM | POA: Diagnosis not present

## 2019-12-16 DIAGNOSIS — Z8249 Family history of ischemic heart disease and other diseases of the circulatory system: Secondary | ICD-10-CM | POA: Diagnosis not present

## 2019-12-16 DIAGNOSIS — F419 Anxiety disorder, unspecified: Secondary | ICD-10-CM | POA: Diagnosis not present

## 2019-12-16 DIAGNOSIS — K219 Gastro-esophageal reflux disease without esophagitis: Secondary | ICD-10-CM | POA: Diagnosis not present

## 2019-12-16 DIAGNOSIS — K7689 Other specified diseases of liver: Secondary | ICD-10-CM | POA: Diagnosis not present

## 2019-12-16 DIAGNOSIS — Z886 Allergy status to analgesic agent status: Secondary | ICD-10-CM | POA: Diagnosis not present

## 2019-12-16 LAB — SURGICAL PCR SCREEN
MRSA, PCR: NEGATIVE
Staphylococcus aureus: NEGATIVE

## 2019-12-16 LAB — CBC WITH DIFFERENTIAL/PLATELET
Abs Immature Granulocytes: 0.01 10*3/uL (ref 0.00–0.07)
Basophils Absolute: 0 10*3/uL (ref 0.0–0.1)
Basophils Relative: 1 %
Eosinophils Absolute: 0.1 10*3/uL (ref 0.0–0.5)
Eosinophils Relative: 2 %
HCT: 45.7 % (ref 39.0–52.0)
Hemoglobin: 15.5 g/dL (ref 13.0–17.0)
Immature Granulocytes: 0 %
Lymphocytes Relative: 29 %
Lymphs Abs: 1.6 10*3/uL (ref 0.7–4.0)
MCH: 31.3 pg (ref 26.0–34.0)
MCHC: 33.9 g/dL (ref 30.0–36.0)
MCV: 92.3 fL (ref 80.0–100.0)
Monocytes Absolute: 0.5 10*3/uL (ref 0.1–1.0)
Monocytes Relative: 9 %
Neutro Abs: 3.3 10*3/uL (ref 1.7–7.7)
Neutrophils Relative %: 59 %
Platelets: 287 10*3/uL (ref 150–400)
RBC: 4.95 MIL/uL (ref 4.22–5.81)
RDW: 12.3 % (ref 11.5–15.5)
WBC: 5.6 10*3/uL (ref 4.0–10.5)
nRBC: 0 % (ref 0.0–0.2)

## 2019-12-16 LAB — COMPREHENSIVE METABOLIC PANEL
ALT: 187 U/L — ABNORMAL HIGH (ref 0–44)
AST: 179 U/L — ABNORMAL HIGH (ref 15–41)
Albumin: 4.1 g/dL (ref 3.5–5.0)
Alkaline Phosphatase: 92 U/L (ref 38–126)
Anion gap: 14 (ref 5–15)
BUN: 16 mg/dL (ref 8–23)
CO2: 25 mmol/L (ref 22–32)
Calcium: 9.1 mg/dL (ref 8.9–10.3)
Chloride: 97 mmol/L — ABNORMAL LOW (ref 98–111)
Creatinine, Ser: 1.28 mg/dL — ABNORMAL HIGH (ref 0.61–1.24)
GFR, Estimated: >60 mL/min (ref >60)
Glucose, Bld: 105 mg/dL — ABNORMAL HIGH (ref 70–99)
Potassium: 3.7 mmol/L (ref 3.5–5.1)
Sodium: 136 mmol/L (ref 135–145)
Total Bilirubin: 2.5 mg/dL — ABNORMAL HIGH (ref 0.3–1.2)
Total Protein: 7.4 g/dL (ref 6.5–8.1)

## 2019-12-16 LAB — BILIRUBIN, FRACTIONATED(TOT/DIR/INDIR)
Bilirubin, Direct: 0.5 mg/dL — ABNORMAL HIGH (ref 0.0–0.2)
Indirect Bilirubin: 1.1 mg/dL — ABNORMAL HIGH (ref 0.3–0.9)
Total Bilirubin: 1.6 mg/dL — ABNORMAL HIGH (ref 0.3–1.2)

## 2019-12-16 LAB — GLUCOSE, CAPILLARY: Glucose-Capillary: 103 mg/dL — ABNORMAL HIGH (ref 70–99)

## 2019-12-16 LAB — LIPASE, BLOOD: Lipase: 37 U/L (ref 11–51)

## 2019-12-16 LAB — MAGNESIUM: Magnesium: 2.2 mg/dL (ref 1.7–2.4)

## 2019-12-16 LAB — LACTATE DEHYDROGENASE: LDH: 225 U/L — ABNORMAL HIGH (ref 98–192)

## 2019-12-16 MED ORDER — GADOBUTROL 1 MMOL/ML IV SOLN
9.0000 mL | Freq: Once | INTRAVENOUS | Status: AC | PRN
  Administered 2019-12-16: 9 mL via INTRAVENOUS

## 2019-12-16 NOTE — Evaluation (Signed)
Physical Therapy Evaluation Patient Details Name: [name] MRN: [id number] DOB: Aug 06, 1950 Today's Date: 12/16/2019   History of Present Illness  69 y.o. male presenting with chest pain, weight gain, and BLE swelling. Upon admission, patient with onset of N/V.PMHx significant for uncontrolled HTN, depression/anxiety, L rotator cuff injury with planned surgical intervention, and Hx of CVA.   Clinical Impression  PTA, patient lives at [place] and independent with mobility and drives. Patient is functioning at baseline with independence with all mobility. No apparent balance deficits noted during ambulation with no AD. No further skilled PT services in acute setting needed at this time. No follow PT recommendations following d/c. PT will sign off at this time.     Follow Up Recommendations No PT follow up    Equipment Recommendations  None recommended by PT    Recommendations for Other Services       Precautions / Restrictions Precautions Precautions: Fall Precaution Comments: Low Fall Risk Restrictions Weight Bearing Restrictions: No      Mobility  Bed Mobility           General bed mobility comments: sitting EOB on arrival    Transfers Overall transfer level: Independent Equipment used: None             General transfer comment: Sit to stand from EOB without AD and I.  Ambulation/Gait Ambulation/Gait assistance: Independent Gait Distance (Feet): 300 Feet Assistive device: None Gait Pattern/deviations: WFL(Within Functional Limits)        Stairs            Wheelchair Mobility    Modified Rankin (Stroke Patients Only)       Balance Overall balance assessment: No apparent balance deficits (not formally assessed)                                           Pertinent Vitals/Pain Pain Assessment: 0-10 Pain Score: 3  Pain Location: L shoulder (rotator cuff injury POA) Pain Intervention(s): Limited activity within patient's  tolerance;Repositioned    Home Living Family/patient expects to be discharged to:: Private residence Living Arrangements: Alone Available Help at Discharge: Family;Available PRN/intermittently Type of Home: Independent living facility Home Access: Level entry     Home Layout: One level Home Equipment: Shower seat - built in;Grab bars - tub/shower;Grab bars - toilet      Prior Function Level of Independence: Independent         Comments: independent with mobility and drives     Hand Dominance   Dominant Hand: Right    Extremity/Trunk Assessment   Upper Extremity Assessment Upper Extremity Assessment: Defer to OT evaluation  Lower Extremity Assessment Lower Extremity Assessment: Overall WFL for tasks assessed    Cervical / Trunk Assessment Cervical / Trunk Assessment: Normal  Communication   Communication: No difficulties  Cognition Arousal/Alertness: Awake/alert Behavior During Therapy: WFL for tasks assessed/performed Overall Cognitive Status: Within Functional Limits for tasks assessed                                        General Comments      Exercises     Assessment/Plan    PT Assessment Patent does not need any further PT services  PT Problem List         PT Treatment Interventions  PT Goals (Current goals can be found in the Care Plan section)  Acute Rehab PT Goals Patient Stated Goal: To travel to [place] 12/10 PT Goal Formulation: With patient    Frequency     Barriers to discharge        Co-evaluation               AM-PAC PT "6 Clicks" Mobility  Outcome Measure Help needed turning from your back to your side while in a flat bed without using bedrails?: None Help needed moving from lying on your back to sitting on the side of a flat bed without using bedrails?: None Help needed moving to and from a bed to a chair (including a wheelchair)?: None Help needed standing up from a chair using your arms (e.g.,  wheelchair or bedside chair)?: None Help needed to walk in hospital room?: None Help needed climbing 3-5 steps with a railing? : None 6 Click Score: 24    End of Session Equipment Utilized During Treatment: Gait belt Activity Tolerance: Patient tolerated treatment well Patient left: Other (comment);with call bell/phone within reach (sitting EOB) Nurse Communication: Mobility status PT Visit Diagnosis: Unsteadiness on feet (R26.81)    Time: 4656-8127 PT Time Calculation (min) (ACUTE ONLY): 14 min   Charges:   PT Evaluation $PT Eval Low Complexity: 1 Low          Perrin Maltese, PT, DPT Acute Rehabilitation Services Pager [phone] Office [phone]   [name] 12/16/2019, 12:00 PM

## 2019-12-16 NOTE — Progress Notes (Signed)
Notified by CCMD patients leads were off. Per NT pt vomiting in BR. Pt states all of a sudden had urge to vomit-states vomited a large amount in toilet. Emesis had been flushed prior to my arrival to room. Pt given PRN Zofran and instructed to not flush any further emesis so I can assess it. Will continue to monitor. Jessie Foot, RN

## 2019-12-16 NOTE — Progress Notes (Signed)
PROGRESS NOTE  [name] YPP:[id number] DOB: 01-06-1951   PCP: Orpah Melter, MD  Patient is from: Home  DOA: 12/14/2019 LOS: 0  Chief complaints: Chest pain  Brief Narrative / Interim history: 69 year old male with history of CVA, HTN, anxiety, depression, GERD and left shoulder pain brought to ED by EMS due to acute chest pain that woke him up from sleep.  Reportedly, chest pain resolved with nitro prior to arrival to ED. he also has nausea, weight gain and BLE edema recently.   In the ED, afebrile.  Hemodynamically stable.  97% on RA.  CBC within normal.  Mildly elevated liver enzymes on CMP.  Initial troponin negative.  EKG with sinus rhythm, RBBB, LAFB, LVH and PRWP.  Two view CXR with right base airspace opacity likely represents compressive.  RUQ ultrasound and TTE ordered.  Admitted for chest pain work-up.  Serial troponin negative.  TTE with normal LVEF, G2-DD, severe LAE and moderate RAE on TTE but no RWMA. BNP within normal.  Didn't tolerate trial of IV Lasix.  Chest pain resolved.  RUQ ultrasound with cholelithiasis and biliary sludge but no signs of cholecystitis.  General surgery consulted.  MRCP with cholelithiasis and gallbladder wall thickening but no choledocholithiasis.  Plan is for lap chole on 12/17/2019.  Subjective: Seen and examined earlier this morning.  He had an episode of emesis last night.  He says emesis was nonbloody.  Continues to have RUQ pain and tenderness.  Edema improved  Objective: Vitals:   12/16/19 0025 12/16/19 0531 12/16/19 0752 12/16/19 1040  BP: (!) 141/83 114/77 108/74 129/90  Pulse: 79 77  69  Resp: 19 17 (!) 25 18  Temp: 98.6 F (37 C) 98.8 F (37.1 C) 98.4 F (36.9 C) 98.5 F (36.9 C)  TempSrc: Oral Oral Oral Oral  SpO2: 95% 97%  100%  Weight:  89.9 kg    Height:        Intake/Output Summary (Last 24 hours) at 12/16/2019 1325 Last data filed at 12/16/2019 0800 Gross per 24 hour  Intake 240 ml  Output 1000 ml  Net  -760 ml   Filed Weights   12/14/19 2334 12/15/19 0645 12/16/19 0531  Weight: 77.1 kg 93.3 kg 89.9 kg    Examination:  GENERAL: No apparent distress.  Nontoxic. HEENT: MMM.  Vision and hearing grossly intact.  NECK: Supple.  No apparent JVD.  RESP: On RA.  No IWOB.  Fair aeration bilaterally. CVS:  RRR. Heart sounds normal.  ABD/GI/GU: BS+. Abd soft.  RUQ tenderness. MSK/EXT:  Moves extremities. No apparent deformity.  Trace edema. SKIN: no apparent skin lesion or wound NEURO: Awake, alert and oriented appropriately.  No apparent focal neuro deficit. PSYCH: Calm. Normal affect.  Procedures:  None  Microbiology summarized: [id number] and influenza PCR nonreactive.  Assessment & Plan: Atypical chest pain: Resolved.  cardiac work-up reassuring so far.  Initial EKG, serial troponin and BNP and TTE without acute finding.  TTE with normal LVEF, G2-DD, severe LAE, moderate RAE and normal pulmonary pressures on TTE but no RWMA.  He has bilateral trace to 1+ edema likely from amlodipine versus CHF.  No orthopnea, PND or crackles.  Edema improved with trial of IV Lasix but creatinine trended up -Check lipid panel and hemoglobin A1c in the morning. -He is already on metoprolol, Plavix and a statin. -May consider as needed Lasix on discharge -Could benefit from outpatient cardiac evaluation such as stress test  Epigastric/RUQ pain likely symptomatic cholelithiasis: Cholelithiasis noted on  RUQ ultrasound and MRCP.  As gallbladder wall thickening likely chronic cholecystitis.  LFT up trended.  He has elevated bilirubin but mainly direct.  ALT within normal. No fever or leukocytosis to suggest infectious process. -Last Plavix dose the morning of 11/28. -Check LDH and haptoglobin -Recheck CBC -Continue PPI.  GI cocktail as needed  Elevated transaminases/hyperbilirubinemia: RUQ ultrasound and MRCP as above.  Acute hepatitis and HIV nonreactive. -Plan for lap chole with cholangiogram  tomorrow -Continue trending. -Hemolysis lab as above  Weight-gain and leg swelling: TTE as above.  No respiratory symptoms.  No fluid on CXR. BNP within normal.  No anemia, hypoalbuminemia, proteinuria or hypothyroidism.  Edema could be due to amlodipine, and improved with IV Lasix.  He could have some venous insufficiency as well. -Discontinue amlodipine -Consider Lasix as needed outpatient -Monitor fluid status and renal function.  Uncontrolled hypertension: Now normotensive. -Resume home medications except amlodipine. -Discontinued amlodipine in the setting of LE edema  Anxiety/depression/insomnia/tremor: Stable -Continue home medications  History of CVA -Hold home Plavix but has already received a dose this morning. -Hold home statin in the setting of LFT.  Left shoulder pain/rotator cuff injury -Has upcoming surgical appointment.   Body mass index is 31.04 kg/m.         DVT prophylaxis:  SCD.  Allergic to pork.  Code Status: Full code Family Communication: Updated patient's significant other at bedside on 11/28.. Status is: Observation  The patient will require care spanning > 2 midnights and should be moved to inpatient because: Hemodynamically unstable, Ongoing diagnostic testing needed not appropriate for outpatient work up, Unsafe d/c plan, IV treatments appropriate due to intensity of illness or inability to take PO and Inpatient level of care appropriate due to severity of illness  Dispo: The patient is from: Home              Anticipated d/c is to: Home              Anticipated d/c date is: 2 days              Patient currently is not medically stable to d/c.       Consultants:  General surgery   Sch Meds:  Scheduled Meds: . busPIRone  10 mg Oral TID  . irbesartan  150 mg Oral Daily  . metoprolol succinate  25 mg Oral Daily  . pantoprazole  40 mg Oral Daily  . PARoxetine  37.5 mg Oral QHS  . primidone  250 mg Oral BID  . QUEtiapine  75 mg  Oral QHS  . Suvorexant  10 mg Oral QHS  . Valbenazine Tosylate  80 mg Oral q AM   Continuous Infusions: PRN Meds:.acetaminophen, alum & mag hydroxide-simeth, nitroGLYCERIN, ondansetron (ZOFRAN) IV  Antimicrobials: Anti-infectives (From admission, onward)   None       I have personally reviewed the following labs and images: CBC: Recent Labs  Lab 12/14/19 2313  WBC 8.3  HGB 14.9  HCT 43.1  MCV 92.1  PLT 273   BMP &GFR Recent Labs  Lab 12/15/19 0103 12/15/19 0626 12/16/19 0512  NA 135 134* 136  K 4.1 4.2 3.7  CL 101 100 97*  CO2 25 24 25   GLUCOSE 105* 100* 105*  BUN 13 14 16   CREATININE 0.97 1.02 1.28*  CALCIUM 9.2 9.4 9.1  MG  --   --  2.2   Estimated Creatinine Clearance: 59.9 mL/min (A) (by C-G formula based on SCr of 1.28 mg/dL (H)). Liver &  Pancreas: Recent Labs  Lab 12/15/19 0103 12/15/19 0626 12/16/19 0512 12/16/19 0742  AST 176* 148* 179*  --   ALT 75* 79* 187*  --   ALKPHOS 52 55 92  --   BILITOT 0.5 0.8 2.5* 1.6*  PROT 6.4* 6.9 7.4  --   ALBUMIN 3.7 3.9 4.1  --    Recent Labs  Lab 12/15/19 0626 12/16/19 0742  LIPASE 45 37   No results for input(s): AMMONIA in the last 168 hours. Diabetic: No results for input(s): HGBA1C in the last 72 hours. Recent Labs  Lab 12/15/19 1628  GLUCAP 89   Cardiac Enzymes: Recent Labs  Lab 12/15/19 0817  CKTOTAL 155   No results for input(s): PROBNP in the last 8760 hours. Coagulation Profile: No results for input(s): INR, PROTIME in the last 168 hours. Thyroid Function Tests: Recent Labs    12/15/19 1027  TSH 2.605   Lipid Profile: No results for input(s): CHOL, HDL, LDLCALC, TRIG, CHOLHDL, LDLDIRECT in the last 72 hours. Anemia Panel: No results for input(s): VITAMINB12, FOLATE, FERRITIN, TIBC, IRON, RETICCTPCT in the last 72 hours. Urine analysis:    Component Value Date/Time   COLORURINE STRAW (A) 12/15/2019 1643   APPEARANCEUR CLEAR 12/15/2019 1643   LABSPEC 1.005 12/15/2019 1643    PHURINE 7.0 12/15/2019 1643   GLUCOSEU NEGATIVE 12/15/2019 1643   HGBUR NEGATIVE 12/15/2019 [place] 12/15/2019 [place] 12/15/2019 1643   PROTEINUR NEGATIVE 12/15/2019 1643   NITRITE NEGATIVE 12/15/2019 1643   LEUKOCYTESUR NEGATIVE 12/15/2019 1643   Sepsis Labs: Invalid input(s): PROCALCITONIN, [place]  Microbiology: Recent Results (from the past 240 hour(s))  Resp Panel by RT-PCR (Flu A&B, Covid) Nasopharyngeal Swab     Status: None   Collection Time: 12/15/19  3:11 AM   Specimen: Nasopharyngeal Swab; Nasopharyngeal(NP) swabs in vial transport medium  Result Value Ref Range Status   SARS Coronavirus 2 by RT PCR NEGATIVE NEGATIVE Final    Comment: (NOTE) SARS-CoV-2 target nucleic acids are NOT DETECTED.  The SARS-CoV-2 RNA is generally detectable in upper respiratory specimens during the acute phase of infection. The lowest concentration of SARS-CoV-2 viral copies this assay can detect is 138 copies/mL. A negative result does not preclude SARS-Cov-2 infection and should not be used as the sole basis for treatment or other patient management decisions. A negative result may occur with  improper specimen collection/handling, submission of specimen other than nasopharyngeal swab, presence of viral mutation(s) within the areas targeted by this assay, and inadequate number of viral copies(<138 copies/mL). A negative result must be combined with clinical observations, patient history, and epidemiological information. The expected result is Negative.  Fact Sheet for Patients:  EntrepreneurPulse.com.au  Fact Sheet for Healthcare Providers:  IncredibleEmployment.be  This test is no t yet approved or cleared by the [place] FDA and  has been authorized for detection and/or diagnosis of SARS-CoV-2 by FDA under an Emergency Use Authorization (EUA). This EUA will remain  in effect (meaning this test can be  used) for the duration of the COVID-19 declaration under Section 564(b)(1) of the Act, 21 U.S.C.section 360bbb-3(b)(1), unless the authorization is terminated  or revoked sooner.       Influenza A by PCR NEGATIVE NEGATIVE Final   Influenza B by PCR NEGATIVE NEGATIVE Final    Comment: (NOTE) The Xpert Xpress SARS-CoV-2/FLU/RSV plus assay is intended as an aid in the diagnosis of influenza from Nasopharyngeal swab specimens and should not be used as a  sole basis for treatment. Nasal washings and aspirates are unacceptable for Xpert Xpress SARS-CoV-2/FLU/RSV testing.  Fact Sheet for Patients: EntrepreneurPulse.com.au  Fact Sheet for Healthcare Providers: IncredibleEmployment.be  This test is not yet approved or cleared by the [place] FDA and has been authorized for detection and/or diagnosis of SARS-CoV-2 by FDA under an Emergency Use Authorization (EUA). This EUA will remain in effect (meaning this test can be used) for the duration of the COVID-19 declaration under Section 564(b)(1) of the Act, 21 U.S.C. section 360bbb-3(b)(1), unless the authorization is terminated or revoked.  Performed at [facility], [place] [address] [place], [place] [address]     Radiology Studies: MR 3D Recon At Scanner  Result Date: 12/16/2019 CLINICAL DATA:  Right upper quadrant abdominal pain, cholelithiasis, elevated liver enzymes. EXAM: MRI ABDOMEN WITHOUT AND WITH CONTRAST (INCLUDING MRCP) TECHNIQUE: Multiplanar multisequence MR imaging of the abdomen was performed both before and after the administration of intravenous contrast. Heavily T2-weighted images of the biliary and pancreatic ducts were obtained, and three-dimensional MRCP images were rendered by post processing. CONTRAST:  5mL GADAVIST GADOBUTROL 1 MMOL/ML IV SOLN COMPARISON:  Abdominal ultrasound 12/15/2019 FINDINGS: Despite efforts by the technologist and patient, motion artifact is present  on today's exam and could not be eliminated. This reduces exam sensitivity and specificity. Lower chest: Unremarkable Hepatobiliary: Numerous scattered cysts in the liver of varying sizes. Small filling defects in the gallbladder as on image 20 of series 8 compatible with at least 2 small gallstones. Borderline appearance for gallbladder wall thickening. No biliary dilatation. No observed filling defect in the biliary tree, to the limits allowed by the motion artifact. Very minimal dropout of signal on out of phase images in the liver may reflect minimal hepatic steatosis. No significant abnormal hepatic enhancement. Pancreas:  Unremarkable Spleen:  Unremarkable Adrenals/Urinary Tract: Small bilateral renal cysts. The left kidney upper pole posterior exophytic cyst on image 60 of series 1600 measures 1.4 by 1.3 cm and is mildly complex but does not enhance, compatible with a small Bosniak category 2 cyst. This is benign and does not require further workup. Adrenal glands unremarkable. Stomach/Bowel: Unremarkable Vascular/Lymphatic: Aortoiliac atherosclerotic vascular disease. No pathologic adenopathy identified. Other:  No supplemental non-categorized findings. Musculoskeletal: Dextroconvex thoracolumbar scoliosis. Postoperative findings at L5-S1 seen on the coronal images. IMPRESSION: 1. Cholelithiasis with borderline appearance for gallbladder wall thickening. No biliary dilatation or evidence of choledocholithiasis. 2. Numerous scattered cysts in the liver. 3. Very minimal dropout of signal on out of phase images in the liver may reflect minimal hepatic steatosis. 4. Dextroconvex thoracolumbar scoliosis. Postoperative findings at L5-S1. 5.  Aortic Atherosclerosis (ICD10-I70.0). 6. Benign-appearing bilateral renal cysts. Electronically Signed   By: Van Clines M.D.   On: 12/16/2019 10:41   MR ABDOMEN MRCP W WO CONTAST  Result Date: 12/16/2019 CLINICAL DATA:  Right upper quadrant abdominal pain,  cholelithiasis, elevated liver enzymes. EXAM: MRI ABDOMEN WITHOUT AND WITH CONTRAST (INCLUDING MRCP) TECHNIQUE: Multiplanar multisequence MR imaging of the abdomen was performed both before and after the administration of intravenous contrast. Heavily T2-weighted images of the biliary and pancreatic ducts were obtained, and three-dimensional MRCP images were rendered by post processing. CONTRAST:  90mL GADAVIST GADOBUTROL 1 MMOL/ML IV SOLN COMPARISON:  Abdominal ultrasound 12/15/2019 FINDINGS: Despite efforts by the technologist and patient, motion artifact is present on today's exam and could not be eliminated. This reduces exam sensitivity and specificity. Lower chest: Unremarkable Hepatobiliary: Numerous scattered cysts in the liver of varying sizes. Small filling defects  in the gallbladder as on image 20 of series 8 compatible with at least 2 small gallstones. Borderline appearance for gallbladder wall thickening. No biliary dilatation. No observed filling defect in the biliary tree, to the limits allowed by the motion artifact. Very minimal dropout of signal on out of phase images in the liver may reflect minimal hepatic steatosis. No significant abnormal hepatic enhancement. Pancreas:  Unremarkable Spleen:  Unremarkable Adrenals/Urinary Tract: Small bilateral renal cysts. The left kidney upper pole posterior exophytic cyst on image 60 of series 1600 measures 1.4 by 1.3 cm and is mildly complex but does not enhance, compatible with a small Bosniak category 2 cyst. This is benign and does not require further workup. Adrenal glands unremarkable. Stomach/Bowel: Unremarkable Vascular/Lymphatic: Aortoiliac atherosclerotic vascular disease. No pathologic adenopathy identified. Other:  No supplemental non-categorized findings. Musculoskeletal: Dextroconvex thoracolumbar scoliosis. Postoperative findings at L5-S1 seen on the coronal images. IMPRESSION: 1. Cholelithiasis with borderline appearance for gallbladder wall  thickening. No biliary dilatation or evidence of choledocholithiasis. 2. Numerous scattered cysts in the liver. 3. Very minimal dropout of signal on out of phase images in the liver may reflect minimal hepatic steatosis. 4. Dextroconvex thoracolumbar scoliosis. Postoperative findings at L5-S1. 5.  Aortic Atherosclerosis (ICD10-I70.0). 6. Benign-appearing bilateral renal cysts. Electronically Signed   By: Van Clines M.D.   On: 12/16/2019 10:41      Breland [place]  If 7PM-7AM, please contact night-coverage [email or web address] 12/16/2019, 1:25 PM

## 2019-12-16 NOTE — Progress Notes (Signed)
OT Cancellation Note  Patient Details Name: AMEN STASZAK MRN: [id number] DOB: 1950/07/23   Cancelled Treatment:    Reason Eval/Treat Not Completed: Patient at procedure or test/ unavailable. Patient off floor. OT will continue efforts toward completion of evaluation when patient is available.   Gloris Manchester OTR/L Supplemental OT, Department of rehab services [phone]  Holy Battenfield R H. 12/16/2019, 8:53 AM

## 2019-12-16 NOTE — Evaluation (Signed)
Occupational Therapy Evaluation Patient Details Name: [name] MRN: [id number] DOB: 26-Nov-1950 Today's Date: 12/16/2019    History of Present Illness 69 y.o. male presenting with chest pain, weight gain, and BLE swelling. Upon admission, patient with onset of N/V.PMHx significant for uncontrolled HTN, depression/anxiety, L rotator cuff injury with planned surgical intervention, and Hx of CVA.    Clinical Impression   PTA patient was independent with BADLs/IADLs including driving and was living at an ILF. Patient reports going to the dining room for meals. Patient currently functioning at baseline demonstrating independence with bed mobility, ADL transfers, and ambulation in room without AD. Patient does not require continued acute occupational therapy services with OT to sign off at this time. No follow-up OT needs post d/c. Please place new OT orders if patient has a change in status.     Follow Up Recommendations  No OT follow up    Equipment Recommendations  None recommended by OT    Recommendations for Other Services       Precautions / Restrictions Precautions Precautions: Fall Precaution Comments: Low Fall Risk Restrictions Weight Bearing Restrictions: No      Mobility Bed Mobility Overal bed mobility: Independent             General bed mobility comments: Supine to EOB with I.    Transfers Overall transfer level: Independent Equipment used: None             General transfer comment: Sit to stand from EOB without AD and I.    Balance Overall balance assessment: No apparent balance deficits (not formally assessed)                                         ADL either performed or assessed with clinical judgement   ADL Overall ADL's : At baseline                                       General ADL Comments: Patient able to ambulate in room without AE and don LB clothing without assist.      Vision Baseline  Vision/History: Wears glasses Wears Glasses: Reading only Patient Visual Report: No change from baseline Vision Assessment?: No apparent visual deficits     Perception     Praxis      Pertinent Vitals/Pain Pain Assessment: 0-10 Pain Score: 3  Pain Location: L shoulder (rotator cuff injury POA) Pain Intervention(s): Limited activity within patient's tolerance;Repositioned     Hand Dominance Right   Extremity/Trunk Assessment Upper Extremity Assessment Upper Extremity Assessment: LUE deficits/detail LUE Deficits / Details: Rotator cuff injury LUE: Unable to fully assess due to pain LUE Sensation: WNL LUE Coordination: WNL   Lower Extremity Assessment Lower Extremity Assessment: Defer to PT evaluation   Cervical / Trunk Assessment Cervical / Trunk Assessment: Normal   Communication Communication Communication: No difficulties   Cognition Arousal/Alertness: Awake/alert Behavior During Therapy: WFL for tasks assessed/performed Overall Cognitive Status: Within Functional Limits for tasks assessed                                     General Comments       Exercises     Shoulder Instructions  Home Living Family/patient expects to be discharged to:: Private residence Living Arrangements: Alone Available Help at Discharge: Family;Available PRN/intermittently Type of Home: Independent living facility Home Access: Level entry     Home Layout: One level     Bathroom Shower/Tub: Occupational psychologist: Handicapped height Bathroom Accessibility: Yes   Home Equipment: Shower seat - built in;Grab bars - tub/shower;Grab bars - toilet          Prior Functioning/Environment Level of Independence: Independent        Comments: Independent with ADLs.         OT Problem List:        OT Treatment/Interventions:      OT Goals(Current goals can be found in the care plan section) Acute Rehab OT Goals Patient Stated Goal: To travel  to [place] 12/10  OT Frequency:     Barriers to D/C:            Co-evaluation              AM-PAC OT "6 Clicks" Daily Activity     Outcome Measure Help from another person eating meals?: None Help from another person taking care of personal grooming?: None Help from another person toileting, which includes using toliet, bedpan, or urinal?: None Help from another person bathing (including washing, rinsing, drying)?: None Help from another person to put on and taking off regular upper body clothing?: None Help from another person to put on and taking off regular lower body clothing?: None 6 Click Score: 24   End of Session Equipment Utilized During Treatment: Gait belt Nurse Communication: Mobility status  Activity Tolerance: Patient tolerated treatment well Patient left: in bed;with call bell/phone within reach;with bed alarm set;with nursing/sitter in room                   Time: 1037-1050 OT Time Calculation (min): 13 min Charges:  OT General Charges $OT Visit: 1 Visit OT Evaluation $OT Eval Low Complexity: 1 Low  Hagop Mccollam H. OTR/L Supplemental OT, Department of rehab services [phone]  Aamilah Augenstein R H. 12/16/2019, 11:06 AM

## 2019-12-16 NOTE — Progress Notes (Addendum)
[facility] Progress Note     Subjective: CC:  Reports acute onset nausea and vomiting overnight. Denies worsening abdominal pain or recurrent chest pain.   We discussed the possibility of choledocholithiasis along with his rising LFTs along with potential recommendations for ERCP/lap chole. Patient expresses concern about missing his best friends funeral 12/10 in [place], as well as his left rotator cuff surgery 12/16.   Objective: Vital signs in last 24 hours: Temp:  [98.4 F (36.9 C)-98.8 F (37.1 C)] 98.4 F (36.9 C) (11/29 0752) Pulse Rate:  [67-79] 77 (11/29 0531) Resp:  [15-25] 25 (11/29 0752) BP: (108-182)/(74-98) 108/74 (11/29 0752) SpO2:  [94 %-97 %] 97 % (11/29 0531) Weight:  [89.9 kg] 89.9 kg (11/29 0531) Last BM Date: 12/14/19  Intake/Output from previous day: 11/28 0701 - 11/29 0700 In: 240 [P.O.:240] Out: 1000 [Urine:1000] Intake/Output this shift: No intake/output data recorded.  PE: Gen:  Alert, NAD, pleasant Card:  Regular rate and rhythm, pedal pulses 2+ BL, mild, non-pitting peripheral edema  Pulm:  Normal effort, clear to auscultation bilaterally Abd: Soft, TTP RUQ without guarding, +BS, no peritonitis  Skin: warm and dry, no rashes  Psych: A&Ox3   Lab Results:  Recent Labs    12/14/19 2313  WBC 8.3  HGB 14.9  HCT 43.1  PLT 273   BMET Recent Labs    12/15/19 0626 12/16/19 0512  NA 134* 136  K 4.2 3.7  CL 100 97*  CO2 24 25  GLUCOSE 100* 105*  BUN 14 16  CREATININE 1.02 1.28*  CALCIUM 9.4 9.1   PT/INR No results for input(s): LABPROT, INR in the last 72 hours. CMP     Component Value Date/Time   NA 136 12/16/2019 0512   K 3.7 12/16/2019 0512   CL 97 (L) 12/16/2019 0512   CO2 25 12/16/2019 0512   GLUCOSE 105 (H) 12/16/2019 0512   BUN 16 12/16/2019 0512   CREATININE 1.28 (H) 12/16/2019 0512   CALCIUM 9.1 12/16/2019 0512   PROT 7.4 12/16/2019 0512   PROT 7.3 05/29/2014 1503   ALBUMIN 4.1 12/16/2019 0512    ALBUMIN 4.7 05/29/2014 1503   AST 179 (H) 12/16/2019 0512   ALT 187 (H) 12/16/2019 0512   ALKPHOS 92 12/16/2019 0512   BILITOT 2.5 (H) 12/16/2019 0512   BILITOT 0.3 05/29/2014 1503   GFRNONAA >60 12/16/2019 0512   GFRAA >60 02/03/2019 0659   Lipase     Component Value Date/Time   LIPASE 45 12/15/2019 0626       Studies/Results: DG Chest 2 View  Result Date: 12/14/2019 CLINICAL DATA:  Chest pain, left lower. EXAM: CHEST - 2 VIEW COMPARISON:  Chest x-ray 12/28/2009 FINDINGS: The heart size and mediastinal contours are within normal limits. Aortic arch calcifications. Right base airspace opacity. No focal consolidation. No pulmonary edema. No pleural effusion. No pneumothorax. Persistent elevation of the left hemidiaphragm. No acute osseous abnormality. Multilevel degenerative changes of the spine with a midthoracic vertebral body demonstrating vertebral body height loss. IMPRESSION: 1. Right base airspace opacity likely represents compressive changes. Overlying infection/inflammation not excluded. 2. Age-indeterminate midthoracic vertebral body compression fracture. Correlate with point tenderness to evaluate for acute component. Electronically Signed   By: Tish Frederickson M.D.   On: 12/14/2019 23:45   ECHOCARDIOGRAM COMPLETE  Result Date: 12/15/2019    ECHOCARDIOGRAM REPORT   Patient Name:   [name] Date of Exam: 12/15/2019 Medical Rec #:  [id number]       Height:  67.0 in Accession #:    [id number]      Weight:       205.7 lb Date of Birth:  [birth year]       BSA:          2.046 m Patient Age:    70 years        BP:           148/84 mmHg Patient Gender: M               HR:           65 bpm. Exam Location:  Inpatient Procedure: 2D Echo, Cardiac Doppler and Color Doppler Indications:    R07.9* Chest pain, unspecified  History:        Patient has prior history of Echocardiogram examinations, most                 recent 12/21/2009.  Sonographer:    Merrie Roof RDCS Referring Phys:  [id number] [place]  1. Left ventricular ejection fraction, by estimation, is 60 to 65%. The left ventricle has normal function. The left ventricle has no regional wall motion abnormalities. There is mild concentric left ventricular hypertrophy. Left ventricular diastolic parameters are consistent with Grade II diastolic dysfunction (pseudonormalization). Elevated left atrial pressure.  2. Right ventricular systolic function is normal. The right ventricular size is mildly enlarged. There is normal pulmonary artery systolic pressure.  3. Left atrial size was severely dilated.  4. Right atrial size was moderately dilated.  5. The mitral valve is normal in structure. No evidence of mitral valve regurgitation. No evidence of mitral stenosis.  6. The aortic valve is normal in structure. Aortic valve regurgitation is not visualized. Mild aortic valve sclerosis is present, with no evidence of aortic valve stenosis.  7. The inferior vena cava is normal in size with greater than 50% respiratory variability, suggesting right atrial pressure of 3 mmHg. Comparison(s): Prior images unable to be directly viewed, comparison made by report only. The left atrium is now severely dilated and there is evidence of worsened diastolic function (increased left atrial pressure). FINDINGS  Left Ventricle: Left ventricular ejection fraction, by estimation, is 60 to 65%. The left ventricle has normal function. The left ventricle has no regional wall motion abnormalities. The left ventricular internal cavity size was normal in size. There is  mild concentric left ventricular hypertrophy. Left ventricular diastolic parameters are consistent with Grade II diastolic dysfunction (pseudonormalization). Elevated left atrial pressure. Right Ventricle: The right ventricular size is mildly enlarged. No increase in right ventricular wall thickness. Right ventricular systolic function is normal. There is normal pulmonary artery systolic  pressure. The tricuspid regurgitant velocity is 2.24  m/s, and with an assumed right atrial pressure of 3 mmHg, the estimated right ventricular systolic pressure is 94.8 mmHg. Left Atrium: Left atrial size was severely dilated. Right Atrium: Right atrial size was moderately dilated. Pericardium: There is no evidence of pericardial effusion. Mitral Valve: The mitral valve is normal in structure. Mild mitral annular calcification. No evidence of mitral valve regurgitation. No evidence of mitral valve stenosis. Tricuspid Valve: The tricuspid valve is normal in structure. Tricuspid valve regurgitation is trivial. No evidence of tricuspid stenosis. Aortic Valve: The aortic valve is normal in structure. Aortic valve regurgitation is not visualized. Mild aortic valve sclerosis is present, with no evidence of aortic valve stenosis. Pulmonic Valve: The pulmonic valve was normal in structure. Pulmonic valve regurgitation is not visualized. No evidence of pulmonic stenosis.  Aorta: The aortic root is normal in size and structure. Venous: The inferior vena cava is normal in size with greater than 50% respiratory variability, suggesting right atrial pressure of 3 mmHg. IAS/Shunts: No atrial level shunt detected by color flow Doppler.  LEFT VENTRICLE PLAX 2D LVIDd:         4.50 cm  Diastology LVIDs:         3.20 cm  LV e' medial:    4.46 cm/s LV PW:         0.90 cm  LV E/e' medial:  23.3 LV IVS:        1.20 cm  LV e' lateral:   6.42 cm/s LVOT diam:     2.10 cm  LV E/e' lateral: 16.2 LV SV:         104 LV SV Index:   51 LVOT Area:     3.46 cm  RIGHT VENTRICLE RV Basal diam:  4.80 cm RV Mid diam:    4.30 cm LEFT ATRIUM              Index       RIGHT ATRIUM           Index LA diam:        5.00 cm  2.44 cm/m  RA Area:     22.20 cm LA Vol (A2C):   148.0 ml 72.32 ml/m RA Volume:   80.90 ml  39.53 ml/m LA Vol (A4C):   91.1 ml  44.52 ml/m LA Biplane Vol: 120.0 ml 58.64 ml/m  AORTIC VALVE LVOT Vmax:   129.00 cm/s LVOT Vmean:  90.400  cm/s LVOT VTI:    0.301 m  AORTA Ao Root diam: 3.40 cm Ao Asc diam:  3.20 cm MITRAL VALVE                TRICUSPID VALVE MV Area (PHT): 3.83 cm     TR Peak grad:   20.1 mmHg MV Decel Time: 198 msec     TR Vmax:        224.00 cm/s MV E velocity: 104.00 cm/s MV A velocity: 89.50 cm/s   SHUNTS MV E/A ratio:  1.16         Systemic VTI:  0.30 m                             Systemic Diam: 2.10 cm Rachelle Hora Croitoru MD Electronically signed by Thurmon Fair MD Signature Date/Time: 12/15/2019/10:58:08 AM    Final    US Abdomen Limited RUQ (LIVER/GB)  Result Date: 12/15/2019 CLINICAL DATA:  Elevated transaminase levels. EXAM: ULTRASOUND ABDOMEN LIMITED RIGHT UPPER QUADRANT COMPARISON:  None. FINDINGS: Gallbladder: Mobile stones and sludge are demonstrated in the gallbladder. No gallbladder wall thickening or edema. Murphy's sign is negative. Common bile duct: Diameter: 3 mm, normal Liver: Multiple circumscribed cysts are demonstrated in the liver, largest in the left lobe measuring 3 cm maximal diameter. Parenchyma are otherwise unremarkable. Portal vein is patent on color Doppler imaging with normal direction of blood flow towards the liver. Other: None. IMPRESSION: 1. Cholelithiasis and sludge in the gallbladder. No additional changes to suggest acute cholecystitis. 2. Multiple benign-appearing hepatic cysts. Electronically Signed   By: Burman Nieves M.D.   On: 12/15/2019 04:43    Anti-infectives: Anti-infectives (From admission, onward)   None       Assessment/Plan Weight gain, leg swelling  Depression, anxiety  PMH CVA on plavix - hold plavix  Chest pain - resolved; Troponin normal, EKG with RBBB, echo w/ LVEF 60-65%, mild LVH, grade II diastolic dysfunction  RUQ pain Symptomatic cholelithiasis, possible chronic cholecystitis, possible choledocholithiasis. - new nausea, vomiting overnight with a rise in bilirubin from 0.8 to 2.5 - AST/ALT 179/187 from 148/79, alk phos WNL, lipase pending -  MRCP and fractionated bilirubin to assess for possible choledocholithiasis - hold plavix (last dose 11/28 AM) -  this patient would ultimately benefit from laparoscopic cholecystectomy for worsening symptomatic cholelithiasis, timing of surgery dependent on MRCP results, plavix wearing off, and patients consent for surgery.   FEN: NPO, ok for clears from surgical perspective, no plans for OR today ID: none  VTE: SCD's, Lovenox  Foley: none Dispo: med-surg, MRCP    LOS: 0 days    Obie Dredge, [facility] Surgery Please see Amion for pager number during day hours 7:00am-4:30pm

## 2019-12-16 NOTE — Discharge Instructions (Addendum)
[facility]  Please arrive at least 30 min before your appointment to complete your check in paperwork.  If you are unable to arrive 30 min prior to your appointment time we may have to cancel or reschedule you. LAPAROSCOPIC SURGERY: POST OP INSTRUCTIONS Always review your discharge instruction sheet given to you by the facility where your surgery was performed. IF YOU HAVE DISABILITY OR FAMILY LEAVE FORMS, YOU MUST BRING THEM TO THE OFFICE FOR PROCESSING.   DO NOT GIVE THEM TO YOUR DOCTOR.  PAIN CONTROL  1. First take acetaminophen (Tylenol) AND/or ibuprofen (Advil) to control your pain after surgery.  Follow directions on package.  Taking acetaminophen (Tylenol) and/or ibuprofen (Advil) regularly after surgery will help to control your pain and lower the amount of prescription pain medication you may need.  You should not take more than 4,000 mg (4 grams) of acetaminophen (Tylenol) in 24 hours.  You should not take ibuprofen (Advil), aleve, motrin, naprosyn or other NSAIDS if you have a history of stomach ulcers or chronic kidney disease.  2. A prescription for pain medication may be given to you upon discharge.  Take your pain medication as prescribed, if you still have uncontrolled pain after taking acetaminophen (Tylenol) or ibuprofen (Advil). 3. Use ice packs to help control pain. 4. If you need a refill on your pain medication, please contact your pharmacy.  They will contact our office to request authorization. Prescriptions will not be filled after 5pm or on week-ends.  HOME MEDICATIONS 5. Take your usually prescribed medications unless otherwise directed.  DIET 6. You should follow a light diet the first few days after arrival home.  Be sure to include lots of fluids daily. Avoid fatty, fried foods.   CONSTIPATION 7. It is common to experience some constipation after surgery and if you are taking pain medication.  Increasing fluid intake and taking a stool  softener (such as Colace) will usually help or prevent this problem from occurring.  A mild laxative (Milk of Magnesia or Miralax) should be taken according to package instructions if there are no bowel movements after 48 hours.  WOUND/INCISION CARE 8. Most patients will experience some swelling and bruising in the area of the incisions.  Ice packs will help.  Swelling and bruising can take several days to resolve.  9. Unless discharge instructions indicate otherwise, follow guidelines below  a. STERI-STRIPS - you may remove your outer bandages 48 hours after surgery, and you may shower at that time.  You have steri-strips (small skin tapes) in place directly over the incision.  These strips should be left on the skin for 7-10 days.   b. DERMABOND/SKIN GLUE - you may shower in 24 hours.  The glue will flake off over the next 2-3 weeks. 10. Any sutures or staples will be removed at the office during your follow-up visit.  ACTIVITIES 11. You may resume regular (light) daily activities beginning the next day--such as daily self-care, walking, climbing stairs--gradually increasing activities as tolerated.  You may have sexual intercourse when it is comfortable.  Refrain from any heavy lifting or straining until approved by your doctor. a. You may drive when you are no longer taking prescription pain medication, you can comfortably wear a seatbelt, and you can safely maneuver your car and apply brakes.  FOLLOW-UP 12. You should see your doctor in the office for a follow-up appointment approximately 2-3 weeks after your surgery.  You should have been given your post-op/follow-up appointment when   your surgery was scheduled.  If you did not receive a post-op/follow-up appointment, make sure that you call for this appointment within a day or two after you arrive home to insure a convenient appointment time.   WHEN TO CALL YOUR DOCTOR: 1. Fever over 101.0 2. Inability to urinate 3. Continued bleeding from  incision. 4. Increased pain, redness, or drainage from the incision. 5. Increasing abdominal pain  The clinic staff is available to answer your questions during regular business hours.  Please don't hesitate to call and ask to speak to one of the nurses for clinical concerns.  If you have a medical emergency, go to the nearest emergency room or call 911.  A surgeon from [facility] is always on call at the hospital. [address], [address], [place], [place]  [address] ? [address] [address], [place], [place]   [address] [phone] ? [phone] ? FAX [phone]  .........   Managing Your Pain After Surgery Without Opioids    Thank you for participating in our program to help patients manage their pain after surgery without opioids. This is part of our effort to provide you with the best care possible, without exposing you or your family to the risk that opioids pose.  What pain can I expect after surgery? You can expect to have some pain after surgery. This is normal. The pain is typically worse the day after surgery, and quickly begins to get better. Many studies have found that many patients are able to manage their pain after surgery with Over-the-Counter (OTC) medications such as Tylenol and Motrin. If you have a condition that does not allow you to take Tylenol or Motrin, notify your surgical team.  How will I manage my pain? The best strategy for controlling your pain after surgery is around the clock pain control with Tylenol (acetaminophen) and Motrin (ibuprofen or Advil). Alternating these medications with each other allows you to maximize your pain control. In addition to Tylenol and Motrin, you can use heating pads or ice packs on your incisions to help reduce your pain.  How will I alternate your regular strength over-the-counter pain medication? You will take a dose of pain medication every three hours. ; Start by taking 650 mg of Tylenol (2 pills of 325  mg) ; 3 hours later take 600 mg of Motrin (3 pills of 200 mg) ; 3 hours after taking the Motrin take 650 mg of Tylenol ; 3 hours after that take 600 mg of Motrin.   - 1 -  See example - if your first dose of Tylenol is at 12:00 PM   12:00 PM Tylenol 650 mg (2 pills of 325 mg)  3:00 PM Motrin 600 mg (3 pills of 200 mg)  6:00 PM Tylenol 650 mg (2 pills of 325 mg)  9:00 PM Motrin 600 mg (3 pills of 200 mg)  Continue alternating every 3 hours   We recommend that you follow this schedule around-the-clock for at least 3 days after surgery, or until you feel that it is no longer needed. Use the table on the last page of this handout to keep track of the medications you are taking. Important: Do not take more than 3000mg of Tylenol or 3200mg of Motrin in a 24-hour period. Do not take ibuprofen/Motrin if you have a history of bleeding stomach ulcers, severe kidney disease, &/or actively taking a blood thinner  What if I still have pain? If you have pain that is not   controlled with the over-the-counter pain medications (Tylenol and Motrin or Advil) you might have what we call "breakthrough" pain. You will receive a prescription for a small amount of an opioid pain medication such as Oxycodone, Tramadol, or Tylenol with Codeine. Use these opioid pills in the first 24 hours after surgery if you have breakthrough pain. Do not take more than 1 pill every 4-6 hours.  If you still have uncontrolled pain after using all opioid pills, don't hesitate to call our staff using the number provided. We will help make sure you are managing your pain in the best way possible, and if necessary, we can provide a prescription for additional pain medication.   Day 1    Time  Name of Medication Number of pills taken  Amount of Acetaminophen  Pain Level   Comments  AM PM       AM PM       AM PM       AM PM       AM PM       AM PM       AM PM       AM PM       Total Daily amount of Acetaminophen Do not  take more than  3,000 mg per day      Day 2    Time  Name of Medication Number of pills taken  Amount of Acetaminophen  Pain Level   Comments  AM PM       AM PM       AM PM       AM PM       AM PM       AM PM       AM PM       AM PM       Total Daily amount of Acetaminophen Do not take more than  3,000 mg per day      Day 3    Time  Name of Medication Number of pills taken  Amount of Acetaminophen  Pain Level   Comments  AM PM       AM PM       AM PM       AM PM          AM PM       AM PM       AM PM       AM PM       Total Daily amount of Acetaminophen Do not take more than  3,000 mg per day      Day 4    Time  Name of Medication Number of pills taken  Amount of Acetaminophen  Pain Level   Comments  AM PM       AM PM       AM PM       AM PM       AM PM       AM PM       AM PM       AM PM       Total Daily amount of Acetaminophen Do not take more than  3,000 mg per day      Day 5    Time  Name of Medication Number of pills taken  Amount of Acetaminophen  Pain Level   Comments  AM PM       AM PM       AM   PM       AM PM       AM PM       AM PM       AM PM       AM PM       Total Daily amount of Acetaminophen Do not take more than  3,000 mg per day       Day 6    Time  Name of Medication Number of pills taken  Amount of Acetaminophen  Pain Level  Comments  AM PM       AM PM       AM PM       AM PM       AM PM       AM PM       AM PM       AM PM       Total Daily amount of Acetaminophen Do not take more than  3,000 mg per day      Day 7    Time  Name of Medication Number of pills taken  Amount of Acetaminophen  Pain Level   Comments  AM PM       AM PM       AM PM       AM PM       AM PM       AM PM       AM PM       AM PM       Total Daily amount of Acetaminophen Do not take more than  3,000 mg per day        For additional information about how and where to safely dispose of unused  opioid medications - https://www.morepowerfulnc.org  Disclaimer: This document contains information and/or instructional materials adapted from [facility] for the typical patient with your condition. It does not replace medical advice from your health care provider because your experience may differ from that of the typical patient. Talk to your health care provider if you have any questions about this document, your condition or your treatment plan. Adapted from [facility]   

## 2019-12-17 ENCOUNTER — Inpatient Hospital Stay (HOSPITAL_COMMUNITY): Payer: Medicare Other | Admitting: Certified Registered Nurse Anesthetist

## 2019-12-17 ENCOUNTER — Encounter (HOSPITAL_COMMUNITY): Payer: Self-pay | Admitting: Student

## 2019-12-17 ENCOUNTER — Encounter (HOSPITAL_COMMUNITY)
Admission: EM | Disposition: A | Discharge status: Home / Self Care | Payer: Self-pay | Source: Home / Self Care | Attending: Student

## 2019-12-17 DIAGNOSIS — E876 Hypokalemia: Secondary | ICD-10-CM

## 2019-12-17 DIAGNOSIS — R079 Chest pain, unspecified: Secondary | ICD-10-CM | POA: Diagnosis not present

## 2019-12-17 DIAGNOSIS — R0789 Other chest pain: Secondary | ICD-10-CM | POA: Diagnosis not present

## 2019-12-17 DIAGNOSIS — K811 Chronic cholecystitis: Secondary | ICD-10-CM | POA: Diagnosis not present

## 2019-12-17 DIAGNOSIS — I5032 Chronic diastolic (congestive) heart failure: Secondary | ICD-10-CM | POA: Diagnosis not present

## 2019-12-17 DIAGNOSIS — N179 Acute kidney failure, unspecified: Secondary | ICD-10-CM

## 2019-12-17 HISTORY — PX: CHOLECYSTECTOMY: SHX55

## 2019-12-17 LAB — GLUCOSE, CAPILLARY: Glucose-Capillary: 102 mg/dL — ABNORMAL HIGH (ref 70–99)

## 2019-12-17 LAB — LIPID PANEL
Cholesterol: 176 mg/dL (ref 0–200)
HDL: 31 mg/dL — ABNORMAL LOW (ref >40)
LDL Cholesterol: 109 mg/dL — ABNORMAL HIGH (ref 0–99)
Total CHOL/HDL Ratio: 5.7 RATIO
Triglycerides: 181 mg/dL — ABNORMAL HIGH (ref <150)
VLDL: 36 mg/dL (ref 0–40)

## 2019-12-17 LAB — MAGNESIUM: Magnesium: 2.1 mg/dL (ref 1.7–2.4)

## 2019-12-17 LAB — COMPREHENSIVE METABOLIC PANEL
ALT: 117 U/L — ABNORMAL HIGH (ref 0–44)
AST: 59 U/L — ABNORMAL HIGH (ref 15–41)
Albumin: 3.8 g/dL (ref 3.5–5.0)
Alkaline Phosphatase: 77 U/L (ref 38–126)
Anion gap: 13 (ref 5–15)
BUN: 28 mg/dL — ABNORMAL HIGH (ref 8–23)
CO2: 29 mmol/L (ref 22–32)
Calcium: 8.6 mg/dL — ABNORMAL LOW (ref 8.9–10.3)
Chloride: 93 mmol/L — ABNORMAL LOW (ref 98–111)
Creatinine, Ser: 1.46 mg/dL — ABNORMAL HIGH (ref 0.61–1.24)
GFR, Estimated: 52 mL/min — ABNORMAL LOW (ref >60)
Glucose, Bld: 143 mg/dL — ABNORMAL HIGH (ref 70–99)
Potassium: 3.1 mmol/L — ABNORMAL LOW (ref 3.5–5.1)
Sodium: 135 mmol/L (ref 135–145)
Total Bilirubin: 0.9 mg/dL (ref 0.3–1.2)
Total Protein: 6.7 g/dL (ref 6.5–8.1)

## 2019-12-17 LAB — HEMOGLOBIN A1C
Hgb A1c MFr Bld: 5.6 % (ref 4.8–5.6)
Mean Plasma Glucose: 114.02 mg/dL

## 2019-12-17 LAB — HAPTOGLOBIN: Haptoglobin: 15 mg/dL — ABNORMAL LOW (ref 32–363)

## 2019-12-17 SURGERY — LAPAROSCOPIC CHOLECYSTECTOMY
Anesthesia: General | Site: Abdomen

## 2019-12-17 MED ORDER — SUCCINYLCHOLINE CHLORIDE 200 MG/10ML IV SOSY
PREFILLED_SYRINGE | INTRAVENOUS | Status: DC | PRN
  Administered 2019-12-17: 120 mg via INTRAVENOUS

## 2019-12-17 MED ORDER — SODIUM CHLORIDE 0.9 % IR SOLN
Status: DC | PRN
  Administered 2019-12-17: 1000 mL

## 2019-12-17 MED ORDER — PHENYLEPHRINE HCL (PRESSORS) 10 MG/ML IV SOLN
INTRAVENOUS | Status: AC
  Filled 2019-12-17: qty 1

## 2019-12-17 MED ORDER — PHENYLEPHRINE 40 MCG/ML (10ML) SYRINGE FOR IV PUSH (FOR BLOOD PRESSURE SUPPORT)
PREFILLED_SYRINGE | INTRAVENOUS | Status: AC
  Filled 2019-12-17: qty 10

## 2019-12-17 MED ORDER — PROPOFOL 10 MG/ML IV BOLUS
INTRAVENOUS | Status: DC | PRN
  Administered 2019-12-17: 160 mg via INTRAVENOUS

## 2019-12-17 MED ORDER — SUGAMMADEX SODIUM 200 MG/2ML IV SOLN
INTRAVENOUS | Status: DC | PRN
  Administered 2019-12-17: 200 mg via INTRAVENOUS

## 2019-12-17 MED ORDER — CEFAZOLIN SODIUM 1 G IJ SOLR
INTRAMUSCULAR | Status: AC
  Filled 2019-12-17: qty 20

## 2019-12-17 MED ORDER — MORPHINE SULFATE (PF) 2 MG/ML IV SOLN
1.0000 mg | INTRAVENOUS | Status: DC | PRN
  Administered 2019-12-17: 1 mg via INTRAVENOUS
  Administered 2019-12-17 (×2): 2 mg via INTRAVENOUS
  Filled 2019-12-17 (×3): qty 1

## 2019-12-17 MED ORDER — FENTANYL CITRATE (PF) 100 MCG/2ML IJ SOLN: 25.0000 ug | INTRAMUSCULAR | Status: DC | PRN

## 2019-12-17 MED ORDER — POTASSIUM CHLORIDE 10 MEQ/100ML IV SOLN
10.0000 meq | INTRAVENOUS | Status: DC
  Administered 2019-12-17: 10 meq via INTRAVENOUS

## 2019-12-17 MED ORDER — FENTANYL CITRATE (PF) 250 MCG/5ML IJ SOLN
INTRAMUSCULAR | Status: DC | PRN
  Administered 2019-12-17: 50 ug via INTRAVENOUS
  Administered 2019-12-17: 100 ug via INTRAVENOUS

## 2019-12-17 MED ORDER — LIDOCAINE HCL (PF) 2 % IJ SOLN
INTRAMUSCULAR | Status: AC
  Filled 2019-12-17: qty 5

## 2019-12-17 MED ORDER — PHENYLEPHRINE 40 MCG/ML (10ML) SYRINGE FOR IV PUSH (FOR BLOOD PRESSURE SUPPORT)
PREFILLED_SYRINGE | INTRAVENOUS | Status: DC | PRN
  Administered 2019-12-17 (×4): 80 ug via INTRAVENOUS

## 2019-12-17 MED ORDER — PROPOFOL 10 MG/ML IV BOLUS
INTRAVENOUS | Status: AC
  Filled 2019-12-17: qty 20

## 2019-12-17 MED ORDER — MIDAZOLAM HCL 2 MG/2ML IJ SOLN
INTRAMUSCULAR | Status: AC
  Filled 2019-12-17: qty 2

## 2019-12-17 MED ORDER — EPHEDRINE 5 MG/ML INJ
INTRAVENOUS | Status: AC
  Filled 2019-12-17: qty 10

## 2019-12-17 MED ORDER — MIDAZOLAM HCL 5 MG/5ML IJ SOLN
INTRAMUSCULAR | Status: DC | PRN
  Administered 2019-12-17: 2 mg via INTRAVENOUS

## 2019-12-17 MED ORDER — ONDANSETRON HCL 4 MG/2ML IJ SOLN
INTRAMUSCULAR | Status: DC | PRN
  Administered 2019-12-17: 4 mg via INTRAVENOUS

## 2019-12-17 MED ORDER — BUPIVACAINE HCL (PF) 0.25 % IJ SOLN
INTRAMUSCULAR | Status: DC | PRN
  Administered 2019-12-17: 9 mL

## 2019-12-17 MED ORDER — SUCCINYLCHOLINE CHLORIDE 200 MG/10ML IV SOSY
PREFILLED_SYRINGE | INTRAVENOUS | Status: AC
  Filled 2019-12-17: qty 10

## 2019-12-17 MED ORDER — POTASSIUM CHLORIDE CRYS ER 20 MEQ PO TBCR
60.0000 meq | EXTENDED_RELEASE_TABLET | Freq: Once | ORAL | Status: AC
  Administered 2019-12-17: 60 meq via ORAL
  Filled 2019-12-17: qty 3

## 2019-12-17 MED ORDER — CEFAZOLIN SODIUM-DEXTROSE 2-3 GM-%(50ML) IV SOLR
INTRAVENOUS | Status: DC | PRN
  Administered 2019-12-17: 2 g via INTRAVENOUS

## 2019-12-17 MED ORDER — LIDOCAINE 2% (20 MG/ML) 5 ML SYRINGE
INTRAMUSCULAR | Status: DC | PRN
  Administered 2019-12-17: 100 mg via INTRAVENOUS

## 2019-12-17 MED ORDER — LACTATED RINGERS IV SOLN: INTRAVENOUS | Status: DC

## 2019-12-17 MED ORDER — ROCURONIUM BROMIDE 10 MG/ML (PF) SYRINGE
PREFILLED_SYRINGE | INTRAVENOUS | Status: DC | PRN
  Administered 2019-12-17: 40 mg via INTRAVENOUS
  Administered 2019-12-17: 20 mg via INTRAVENOUS

## 2019-12-17 MED ORDER — 0.9 % SODIUM CHLORIDE (POUR BTL) OPTIME
TOPICAL | Status: DC | PRN
  Administered 2019-12-17: 1000 mL

## 2019-12-17 MED ORDER — EPHEDRINE SULFATE-NACL 50-0.9 MG/10ML-% IV SOSY
PREFILLED_SYRINGE | INTRAVENOUS | Status: DC | PRN
  Administered 2019-12-17: 10 mg via INTRAVENOUS
  Administered 2019-12-17: 5 mg via INTRAVENOUS
  Administered 2019-12-17: 10 mg via INTRAVENOUS

## 2019-12-17 MED ORDER — BUPIVACAINE HCL (PF) 0.25 % IJ SOLN
INTRAMUSCULAR | Status: AC
  Filled 2019-12-17: qty 30

## 2019-12-17 MED ORDER — POTASSIUM CHLORIDE IN NACL 40-0.9 MEQ/L-% IV SOLN
INTRAVENOUS | Status: AC
  Filled 2019-12-17 (×3): qty 1000

## 2019-12-17 MED ORDER — TRAMADOL HCL 50 MG PO TABS
100.0000 mg | ORAL_TABLET | Freq: Four times a day (QID) | ORAL | Status: DC | PRN
  Administered 2019-12-17 – 2019-12-18 (×3): 100 mg via ORAL
  Filled 2019-12-17 (×3): qty 2

## 2019-12-17 MED ORDER — POTASSIUM CHLORIDE 10 MEQ/100ML IV SOLN
10.0000 meq | INTRAVENOUS | Status: DC
  Filled 2019-12-17: qty 100

## 2019-12-17 MED ORDER — FENTANYL CITRATE (PF) 250 MCG/5ML IJ SOLN
INTRAMUSCULAR | Status: AC
  Filled 2019-12-17: qty 5

## 2019-12-17 MED ORDER — PHENYLEPHRINE HCL-NACL 10-0.9 MG/250ML-% IV SOLN
INTRAVENOUS | Status: DC | PRN
  Administered 2019-12-17: 50 ug/min via INTRAVENOUS

## 2019-12-17 MED ORDER — LACTATED RINGERS IV SOLN: INTRAVENOUS | Status: DC | PRN

## 2019-12-17 MED ORDER — CHLORHEXIDINE GLUCONATE 0.12 % MT SOLN
15.0000 mL | Freq: Once | OROMUCOSAL | Status: AC
  Administered 2019-12-17: 15 mL via OROMUCOSAL
  Filled 2019-12-17: qty 15

## 2019-12-17 MED ORDER — INDOCYANINE GREEN 25 MG IV SOLR
INTRAVENOUS | Status: DC | PRN
  Administered 2019-12-17: 5 mg via INTRAVENOUS

## 2019-12-17 MED ORDER — DEXAMETHASONE SODIUM PHOSPHATE 10 MG/ML IJ SOLN
INTRAMUSCULAR | Status: DC | PRN
  Administered 2019-12-17: 5 mg via INTRAVENOUS

## 2019-12-17 SURGICAL SUPPLY — 38 items
APPLIER CLIP 5 13 M/L LIGAMAX5 (MISCELLANEOUS) ×2
BLADE CLIPPER SURG (BLADE) IMPLANT
CANISTER SUCT 3000ML PPV (MISCELLANEOUS) ×2 IMPLANT
CHLORAPREP W/TINT 26 (MISCELLANEOUS) ×2 IMPLANT
CLIP APPLIE 5 13 M/L LIGAMAX5 (MISCELLANEOUS) ×1 IMPLANT
COVER SURGICAL LIGHT HANDLE (MISCELLANEOUS) ×2 IMPLANT
COVER WAND RF STERILE (DRAPES) ×2 IMPLANT
DERMABOND ADVANCED (GAUZE/BANDAGES/DRESSINGS) ×1
DERMABOND ADVANCED .7 DNX12 (GAUZE/BANDAGES/DRESSINGS) ×1 IMPLANT
ELECT REM PT RETURN 9FT ADLT (ELECTROSURGICAL) ×2
ELECTRODE REM PT RTRN 9FT ADLT (ELECTROSURGICAL) ×1 IMPLANT
GLOVE BIO SURGEON STRL SZ7 (GLOVE) ×2 IMPLANT
GLOVE BIOGEL PI IND STRL 7.5 (GLOVE) ×1 IMPLANT
GLOVE BIOGEL PI INDICATOR 7.5 (GLOVE) ×1
GOWN STRL REUS W/ TWL LRG LVL3 (GOWN DISPOSABLE) ×3 IMPLANT
GOWN STRL REUS W/TWL LRG LVL3 (GOWN DISPOSABLE) ×3
GRASPER SUT TROCAR 14GX15 (MISCELLANEOUS) ×2 IMPLANT
KIT BASIN OR (CUSTOM PROCEDURE TRAY) ×2 IMPLANT
KIT DISPOSABLE SPY ELITE (KITS) ×2 IMPLANT
KIT TURNOVER KIT B (KITS) ×2 IMPLANT
NS IRRIG 1000ML POUR BTL (IV SOLUTION) ×2 IMPLANT
PAD ARMBOARD 7.5X6 YLW CONV (MISCELLANEOUS) ×2 IMPLANT
POUCH RETRIEVAL ECOSAC 10 (ENDOMECHANICALS) ×1 IMPLANT
POUCH RETRIEVAL ECOSAC 10MM (ENDOMECHANICALS) ×1
SCISSORS LAP 5X35 DISP (ENDOMECHANICALS) ×2 IMPLANT
SET IRRIG TUBING LAPAROSCOPIC (IRRIGATION / IRRIGATOR) ×2 IMPLANT
SET TUBE SMOKE EVAC HIGH FLOW (TUBING) ×2 IMPLANT
SLEEVE ENDOPATH XCEL 5M (ENDOMECHANICALS) ×4 IMPLANT
SPECIMEN JAR SMALL (MISCELLANEOUS) ×2 IMPLANT
STRIP CLOSURE SKIN 1/2X4 (GAUZE/BANDAGES/DRESSINGS) ×2 IMPLANT
SUT MNCRL AB 4-0 PS2 18 (SUTURE) ×2 IMPLANT
SUT VICRYL 0 UR6 27IN ABS (SUTURE) ×2 IMPLANT
TOWEL GREEN STERILE (TOWEL DISPOSABLE) ×2 IMPLANT
TOWEL GREEN STERILE FF (TOWEL DISPOSABLE) ×2 IMPLANT
TRAY LAPAROSCOPIC MC (CUSTOM PROCEDURE TRAY) ×2 IMPLANT
TROCAR XCEL BLUNT TIP 100MML (ENDOMECHANICALS) ×2 IMPLANT
TROCAR XCEL NON-BLD 5MMX100MML (ENDOMECHANICALS) ×2 IMPLANT
WATER STERILE IRR 1000ML POUR (IV SOLUTION) ×2 IMPLANT

## 2019-12-17 NOTE — Progress Notes (Signed)
Day of Surgery   Subjective/Chief Complaint: No complaints this am   Objective: Vital signs in last 24 hours: Temp:  [97.8 F (36.6 C)-98.5 F (36.9 C)] 97.8 F (36.6 C) (11/30 0547) Pulse Rate:  [60-72] 60 (11/30 0547) Resp:  [18] 18 (11/30 0547) BP: (107-129)/(64-90) 107/64 (11/30 0547) SpO2:  [97 %-100 %] 98 % (11/30 0547) Weight:  [94.2 kg] 94.2 kg (11/30 0547) Last BM Date: 12/14/19  Intake/Output from previous day: No intake/output data recorded. Intake/Output this shift: No intake/output data recorded.  GI: soft nt/nd  Lab Results:  Recent Labs    12/14/19 2313 12/16/19 1307  WBC 8.3 5.6  HGB 14.9 15.5  HCT 43.1 45.7  PLT 273 287   BMET Recent Labs    12/16/19 0512 12/17/19 0241  NA 136 135  K 3.7 3.1*  CL 97* 93*  CO2 25 29  GLUCOSE 105* 143*  BUN 16 28*  CREATININE 1.28* 1.46*  CALCIUM 9.1 8.6*   PT/INR No results for input(s): LABPROT, INR in the last 72 hours. ABG No results for input(s): PHART, HCO3 in the last 72 hours.  Invalid input(s): PCO2, PO2  Studies/Results: MR 3D Recon At Scanner  Result Date: 12/16/2019 CLINICAL DATA:  Right upper quadrant abdominal pain, cholelithiasis, elevated liver enzymes. EXAM: MRI ABDOMEN WITHOUT AND WITH CONTRAST (INCLUDING MRCP) TECHNIQUE: Multiplanar multisequence MR imaging of the abdomen was performed both before and after the administration of intravenous contrast. Heavily T2-weighted images of the biliary and pancreatic ducts were obtained, and three-dimensional MRCP images were rendered by post processing. CONTRAST:  5mL GADAVIST GADOBUTROL 1 MMOL/ML IV SOLN COMPARISON:  Abdominal ultrasound 12/15/2019 FINDINGS: Despite efforts by the technologist and patient, motion artifact is present on today's exam and could not be eliminated. This reduces exam sensitivity and specificity. Lower chest: Unremarkable Hepatobiliary: Numerous scattered cysts in the liver of varying sizes. Small filling defects in the  gallbladder as on image 20 of series 8 compatible with at least 2 small gallstones. Borderline appearance for gallbladder wall thickening. No biliary dilatation. No observed filling defect in the biliary tree, to the limits allowed by the motion artifact. Very minimal dropout of signal on out of phase images in the liver may reflect minimal hepatic steatosis. No significant abnormal hepatic enhancement. Pancreas:  Unremarkable Spleen:  Unremarkable Adrenals/Urinary Tract: Small bilateral renal cysts. The left kidney upper pole posterior exophytic cyst on image 60 of series 1600 measures 1.4 by 1.3 cm and is mildly complex but does not enhance, compatible with a small Bosniak category 2 cyst. This is benign and does not require further workup. Adrenal glands unremarkable. Stomach/Bowel: Unremarkable Vascular/Lymphatic: Aortoiliac atherosclerotic vascular disease. No pathologic adenopathy identified. Other:  No supplemental non-categorized findings. Musculoskeletal: Dextroconvex thoracolumbar scoliosis. Postoperative findings at L5-S1 seen on the coronal images. IMPRESSION: 1. Cholelithiasis with borderline appearance for gallbladder wall thickening. No biliary dilatation or evidence of choledocholithiasis. 2. Numerous scattered cysts in the liver. 3. Very minimal dropout of signal on out of phase images in the liver may reflect minimal hepatic steatosis. 4. Dextroconvex thoracolumbar scoliosis. Postoperative findings at L5-S1. 5.  Aortic Atherosclerosis (ICD10-I70.0). 6. Benign-appearing bilateral renal cysts. Electronically Signed   By: Van Clines M.D.   On: 12/16/2019 10:41   MR ABDOMEN MRCP W WO CONTAST  Result Date: 12/16/2019 CLINICAL DATA:  Right upper quadrant abdominal pain, cholelithiasis, elevated liver enzymes. EXAM: MRI ABDOMEN WITHOUT AND WITH CONTRAST (INCLUDING MRCP) TECHNIQUE: Multiplanar multisequence MR imaging of the abdomen was performed both before  and after the administration of  intravenous contrast. Heavily T2-weighted images of the biliary and pancreatic ducts were obtained, and three-dimensional MRCP images were rendered by post processing. CONTRAST:  70mL GADAVIST GADOBUTROL 1 MMOL/ML IV SOLN COMPARISON:  Abdominal ultrasound 12/15/2019 FINDINGS: Despite efforts by the technologist and patient, motion artifact is present on today's exam and could not be eliminated. This reduces exam sensitivity and specificity. Lower chest: Unremarkable Hepatobiliary: Numerous scattered cysts in the liver of varying sizes. Small filling defects in the gallbladder as on image 20 of series 8 compatible with at least 2 small gallstones. Borderline appearance for gallbladder wall thickening. No biliary dilatation. No observed filling defect in the biliary tree, to the limits allowed by the motion artifact. Very minimal dropout of signal on out of phase images in the liver may reflect minimal hepatic steatosis. No significant abnormal hepatic enhancement. Pancreas:  Unremarkable Spleen:  Unremarkable Adrenals/Urinary Tract: Small bilateral renal cysts. The left kidney upper pole posterior exophytic cyst on image 60 of series 1600 measures 1.4 by 1.3 cm and is mildly complex but does not enhance, compatible with a small Bosniak category 2 cyst. This is benign and does not require further workup. Adrenal glands unremarkable. Stomach/Bowel: Unremarkable Vascular/Lymphatic: Aortoiliac atherosclerotic vascular disease. No pathologic adenopathy identified. Other:  No supplemental non-categorized findings. Musculoskeletal: Dextroconvex thoracolumbar scoliosis. Postoperative findings at L5-S1 seen on the coronal images. IMPRESSION: 1. Cholelithiasis with borderline appearance for gallbladder wall thickening. No biliary dilatation or evidence of choledocholithiasis. 2. Numerous scattered cysts in the liver. 3. Very minimal dropout of signal on out of phase images in the liver may reflect minimal hepatic steatosis. 4.  Dextroconvex thoracolumbar scoliosis. Postoperative findings at L5-S1. 5.  Aortic Atherosclerosis (ICD10-I70.0). 6. Benign-appearing bilateral renal cysts. Electronically Signed   By: Van Clines M.D.   On: 12/16/2019 10:41   ECHOCARDIOGRAM COMPLETE  Result Date: 12/15/2019    ECHOCARDIOGRAM REPORT   Patient Name:   [name] Date of Exam: 12/15/2019 Medical Rec #:  [id number]       Height:       67.0 in Accession #:    [id number]      Weight:       205.7 lb Date of Birth:  1950-07-06       BSA:          2.046 m Patient Age:    69 years        BP:           148/84 mmHg Patient Gender: M               HR:           65 bpm. Exam Location:  Inpatient Procedure: 2D Echo, Cardiac Doppler and Color Doppler Indications:    R07.9* Chest pain, unspecified  History:        Patient has prior history of Echocardiogram examinations, most                 recent 12/21/2009.  Sonographer:    Merrie Roof RDCS Referring Phys: [id number] [place]  1. Left ventricular ejection fraction, by estimation, is 60 to 65%. The left ventricle has normal function. The left ventricle has no regional wall motion abnormalities. There is mild concentric left ventricular hypertrophy. Left ventricular diastolic parameters are consistent with Grade II diastolic dysfunction (pseudonormalization). Elevated left atrial pressure.  2. Right ventricular systolic function is normal. The right ventricular size is mildly enlarged. There is normal pulmonary artery  systolic pressure.  3. Left atrial size was severely dilated.  4. Right atrial size was moderately dilated.  5. The mitral valve is normal in structure. No evidence of mitral valve regurgitation. No evidence of mitral stenosis.  6. The aortic valve is normal in structure. Aortic valve regurgitation is not visualized. Mild aortic valve sclerosis is present, with no evidence of aortic valve stenosis.  7. The inferior vena cava is normal in size with greater than 50%  respiratory variability, suggesting right atrial pressure of 3 mmHg. Comparison(s): Prior images unable to be directly viewed, comparison made by report only. The left atrium is now severely dilated and there is evidence of worsened diastolic function (increased left atrial pressure). FINDINGS  Left Ventricle: Left ventricular ejection fraction, by estimation, is 60 to 65%. The left ventricle has normal function. The left ventricle has no regional wall motion abnormalities. The left ventricular internal cavity size was normal in size. There is  mild concentric left ventricular hypertrophy. Left ventricular diastolic parameters are consistent with Grade II diastolic dysfunction (pseudonormalization). Elevated left atrial pressure. Right Ventricle: The right ventricular size is mildly enlarged. No increase in right ventricular wall thickness. Right ventricular systolic function is normal. There is normal pulmonary artery systolic pressure. The tricuspid regurgitant velocity is 2.24  m/s, and with an assumed right atrial pressure of 3 mmHg, the estimated right ventricular systolic pressure is 17.4 mmHg. Left Atrium: Left atrial size was severely dilated. Right Atrium: Right atrial size was moderately dilated. Pericardium: There is no evidence of pericardial effusion. Mitral Valve: The mitral valve is normal in structure. Mild mitral annular calcification. No evidence of mitral valve regurgitation. No evidence of mitral valve stenosis. Tricuspid Valve: The tricuspid valve is normal in structure. Tricuspid valve regurgitation is trivial. No evidence of tricuspid stenosis. Aortic Valve: The aortic valve is normal in structure. Aortic valve regurgitation is not visualized. Mild aortic valve sclerosis is present, with no evidence of aortic valve stenosis. Pulmonic Valve: The pulmonic valve was normal in structure. Pulmonic valve regurgitation is not visualized. No evidence of pulmonic stenosis. Aorta: The aortic root is  normal in size and structure. Venous: The inferior vena cava is normal in size with greater than 50% respiratory variability, suggesting right atrial pressure of 3 mmHg. IAS/Shunts: No atrial level shunt detected by color flow Doppler.  LEFT VENTRICLE PLAX 2D LVIDd:         4.50 cm  Diastology LVIDs:         3.20 cm  LV e' medial:    4.46 cm/s LV PW:         0.90 cm  LV E/e' medial:  23.3 LV IVS:        1.20 cm  LV e' lateral:   6.42 cm/s LVOT diam:     2.10 cm  LV E/e' lateral: 16.2 LV SV:         104 LV SV Index:   51 LVOT Area:     3.46 cm  RIGHT VENTRICLE RV Basal diam:  4.80 cm RV Mid diam:    4.30 cm LEFT ATRIUM              Index       RIGHT ATRIUM           Index LA diam:        5.00 cm  2.44 cm/m  RA Area:     22.20 cm LA Vol (A2C):   148.0 ml 72.32 ml/m RA Volume:  80.90 ml  39.53 ml/m LA Vol (A4C):   91.1 ml  44.52 ml/m LA Biplane Vol: 120.0 ml 58.64 ml/m  AORTIC VALVE LVOT Vmax:   129.00 cm/s LVOT Vmean:  90.400 cm/s LVOT VTI:    0.301 m  AORTA Ao Root diam: 3.40 cm Ao Asc diam:  3.20 cm MITRAL VALVE                TRICUSPID VALVE MV Area (PHT): 3.83 cm     TR Peak grad:   20.1 mmHg MV Decel Time: 198 msec     TR Vmax:        224.00 cm/s MV E velocity: 104.00 cm/s MV A velocity: 89.50 cm/s   SHUNTS MV E/A ratio:  1.16         Systemic VTI:  0.30 m                             Systemic Diam: 2.10 cm Dani Gobble Croitoru MD Electronically signed by Sanda Klein MD Signature Date/Time: 12/15/2019/10:58:08 AM    Final     Anti-infectives: Anti-infectives (From admission, onward)   None      Assessment/Plan: Biliary colic -lfts normal today -plan lap chole today  Rolm Bookbinder 12/17/2019

## 2019-12-17 NOTE — Anesthesia Preprocedure Evaluation (Addendum)
Anesthesia Evaluation  Patient identified by MRN, date of birth, ID band Patient awake    Reviewed: Allergy & Precautions, Patient's Chart, lab work & pertinent test results  Airway Mallampati: II  TM Distance: >3 FB     Dental  (+) Teeth Intact, Dental Advisory Given   Pulmonary former smoker,    breath sounds clear to auscultation       Cardiovascular hypertension,  Rhythm:Regular Rate:Normal     Neuro/Psych PSYCHIATRIC DISORDERS Anxiety Depression CVA    GI/Hepatic Neg liver ROS, GERD  ,  Endo/Other  negative endocrine ROS  Renal/GU negative Renal ROS     Musculoskeletal   Abdominal   Peds  Hematology   Anesthesia Other Findings   Reproductive/Obstetrics                            Anesthesia Physical Anesthesia Plan  ASA: III  Anesthesia Plan: General   Post-op Pain Management:    Induction: Intravenous  PONV Risk Score and Plan: 3 and Ondansetron, Dexamethasone and Midazolam  Airway Management Planned: Oral ETT  Additional Equipment:   Intra-op Plan:   Post-operative Plan: Extubation in OR  Informed Consent: I have reviewed the patients History and Physical, chart, labs and discussed the procedure including the risks, benefits and alternatives for the proposed anesthesia with the patient or authorized representative who has indicated his/her understanding and acceptance.     Dental advisory given  Plan Discussed with: CRNA and Anesthesiologist  Anesthesia Plan Comments:         Anesthesia Quick Evaluation

## 2019-12-17 NOTE — Op Note (Signed)
Preoperative diagnosis: Acute cholecystitis Postoperative diagnosis acute cholecystitis Procedure: Laparoscopic cholecystectomy Surgeon: Dr. Serita Grammes Assistant: Saverio Danker Anesthesia: General Complications: None Drains: None Estimated blood loss: 10 cc Specimens: Gallbladder and contents to pathology Sponge and count was correct at completion Disposition to recovery in stable condition  Indications: This is a 70 yom who presents with chest pain but found to have ruq pain. He has stones on [place] and may have passed one.  We discussed lap chole.   Procedure: After informed consent was obtained the patient was taken to the operating. He was given antibiotics. SCDs were in place. He was placed under general anesthesia without complication. He was prepped and draped in the standard sterile surgical fashion. Surgical timeout was then performed.  I infiltrated Marcaine below his umbilicus. I made a vertical incision. I entered the peritoneum after entering the fascia sharply.  I then placed a 0 Vicryl pursestring suture through the fascia and inserted a Hassan trocar. I insufflated the abdomen to 15 mmHg pressure. I then inserted 3 further 5 mm trocars in epigastrium and right upper quadrant under direct vision without complication. The gallbladder was noted to have chronic cholecystitis. It was retracted cephalad. I took down some adhesions to the omentum as well as the duodenum. The gallbladder was then retracted lateral. I then was able to dissect in the triangle and clearly obtain the critical view of safety. I took the gallbladder off the cystic plate of the liver enough so that I clearly had this view. I then clipped the duct and the artery 3 times and divided them leaving 2 clips in place. I then removed the gallbladder from the liver bed. The gallbladder was placed in a retrieval bag and removed from the umbilical incision. I then tied down my pursestring after removing the [facility] trocar.  I did place an additional 0 Vicryl suture using the suture passer device. The incisions were then closed with 4-0 Monocryl and glue. She tolerated this well was extubated transferred recovery stable.

## 2019-12-17 NOTE — Transfer of Care (Signed)
Immediate Anesthesia Transfer of Care Note  Patient: [name]  Procedure(s) Performed: LAPAROSCOPIC CHOLECYSTECTOMY WITH ICG INJECTION (N/A Abdomen)  Patient Location: PACU  Anesthesia Type:General  Level of Consciousness: awake and alert   Airway & Oxygen Therapy: Patient Spontanous Breathing  Post-op Assessment: Report given to RN and Post -op Vital signs reviewed and stable  Post vital signs: Reviewed and stable  Last Vitals:  Vitals Value Taken Time  BP 163/88 12/17/19 1004  Temp    Pulse 67 12/17/19 1005  Resp 22 12/17/19 1005  SpO2 92 % 12/17/19 1005  Vitals shown include unvalidated device data.  Last Pain:  Vitals:   12/17/19 0747  TempSrc:   PainSc: 0-No pain      Patients Stated Pain Goal: 3 (53/61/44 3154)  Complications: No complications documented.

## 2019-12-17 NOTE — Anesthesia Procedure Notes (Addendum)
Procedure Name: Intubation Date/Time: 12/17/2019 8:57 AM Performed by: Harden Mo, CRNA Pre-anesthesia Checklist: Patient identified, Emergency Drugs available, Suction available and Patient being monitored Patient Re-evaluated:Patient Re-evaluated prior to induction Oxygen Delivery Method: Circle System Utilized Preoxygenation: Pre-oxygenation with 100% oxygen Induction Type: IV induction Ventilation: Mask ventilation without difficulty Laryngoscope Size: Mac and 4 Grade View: Grade I Tube type: Oral Tube size: 7.5 mm Number of attempts: 1 Airway Equipment and Method: Stylet Placement Confirmation: ETT inserted through vocal cords under direct vision,  positive ETCO2 and breath sounds checked- equal and bilateral Secured at: 23 cm Tube secured with: Tape Dental Injury: Teeth and Oropharynx as per pre-operative assessment  Comments: Performed by Sueanne Margarita, SRNA

## 2019-12-17 NOTE — Plan of Care (Signed)

## 2019-12-17 NOTE — Progress Notes (Addendum)
PROGRESS NOTE  [name]:[id number] DOB: 04/02/1950   PCP: Orpah Melter, MD  Patient is from: Home  DOA: 12/14/2019 LOS: 1  Chief complaints: Chest pain  Brief Narrative / Interim history: 69 year old male with history of CVA, HTN, anxiety, depression, GERD and left shoulder pain brought to ED by EMS due to acute chest pain that woke him up from sleep.  Reportedly, chest pain resolved with nitro prior to arrival to ED. he also has nausea, weight gain and BLE edema recently.   In the ED, afebrile.  Hemodynamically stable.  97% on RA.  CBC within normal.  Mildly elevated liver enzymes on CMP.  Initial troponin negative.  EKG with sinus rhythm, RBBB, LAFB, LVH and PRWP.  Two view CXR with right base airspace opacity likely represents compressive.  RUQ ultrasound and TTE ordered.  Admitted for chest pain work-up.  Serial troponin negative.  TTE with normal LVEF, G2-DD, severe LAE and moderate RAE on TTE but no RWMA. BNP within normal.  Didn't tolerate trial of IV Lasix.  Chest pain resolved.  RUQ ultrasound with cholelithiasis and biliary sludge but no signs of cholecystitis.  General surgery consulted.  MRCP with cholelithiasis and gallbladder wall thickening but no choledocholithiasis.  Underwent laparoscopic cholecystectomy on 12/17/2019.  Hopefully home on 12/18/2019.  Subjective: Seen and examined earlier this morning.  No major events overnight or this morning after laparoscopic cholecystectomy.  Reports 5/10 abdominal pain.  Received tramadol less than an hour ago.  Denies chest pain, dyspnea, nausea or vomiting.  Significant other at bedside.  Objective: Vitals:   12/17/19 1030 12/17/19 1034 12/17/19 1047 12/17/19 1104  BP:  117/72 126/77 127/73  Pulse: 61 (!) 58 (!) 57   Resp: 20 19 13 16   Temp:   98.2 F (36.8 C) 98.3 F (36.8 C)  TempSrc:    Oral  SpO2: 95% 94% 95%   Weight:      Height:        Intake/Output Summary (Last 24 hours) at 12/17/2019 1652 Last  data filed at 12/17/2019 1620 Gross per 24 hour  Intake 1346.57 ml  Output 10 ml  Net 1336.57 ml   Filed Weights   12/15/19 0645 12/16/19 0531 12/17/19 0547  Weight: 93.3 kg 89.9 kg 94.2 kg    Examination:  GENERAL: No apparent distress.  Nontoxic. HEENT: MMM.  Vision and hearing grossly intact.  NECK: Supple.  No apparent JVD.  RESP:  No IWOB.  Fair aeration bilaterally. CVS:  RRR. Heart sounds normal.  ABD/GI/GU: BS+. Abd soft.  Mild diffuse tenderness.  Dressing over laparoscopic wounds. MSK/EXT:  Moves extremities. No apparent deformity.  Trace edema. SKIN: no apparent skin lesion or wound NEURO: Awake, alert and oriented appropriately.  No apparent focal neuro deficit. PSYCH: Calm. Normal affect.  Procedures:  None  Microbiology summarized: GNOIB-70 and influenza PCR nonreactive.  Assessment & Plan: Atypical chest pain: Resolved.  cardiac work-up reassuring so far.  Initial EKG, serial troponin and BNP and TTE without acute finding.  TTE with normal LVEF, G2-DD, severe LAE, moderate RAE and normal pulmonary pressures on TTE but no RWMA.  He has bilateral trace to 1+ edema likely from amlodipine versus CHF.  No orthopnea, PND or crackles.  Edema improved with trial of IV Lasix but creatinine trended up -Check lipid panel and hemoglobin A1c in the morning. -He is already on metoprolol, Plavix and a statin. -May consider as needed Lasix on discharge -Could benefit from outpatient cardiac evaluation such as  stress test  Epigastric/RUQ pain likely symptomatic cholelithiasis and possible chronic cholecystitis:  -Status post laparoscopic cholecystectomy on 12/17/2019. -LFT improved. -Resume p.o. Plavix in the morning  Elevated transaminases/hyperbilirubinemia: RUQ ultrasound and MRCP as above.  Acute hepatitis and HIV nonreactive.  LFT improved.  Hyperbilirubinemia resolved. -Continue monitoring  Weight-gain and leg swelling: TTE as above.  No respiratory symptoms.  No  fluid on CXR. BNP within normal.  No anemia, hypoalbuminemia, proteinuria or hypothyroidism.  Edema could be due to amlodipine, and improved with IV Lasix.  -Discontinue amlodipine -Consider p.o. Lasix as needed outpatient. -Monitor fluid status and renal function.  AKI/mild azotemia: Multifactorial including diuretics, ARB and nausea and vomiting.  Received a dose of Lasix 2 days ago. -Diuretics discontinued -Discontinued ARB -Gentle IV fluid -Recheck in the morning  Hypokalemia: K3.1.  Mg within normal. -Replenish and recheck.  Uncontrolled hypertension: Now normotensive. -Resume home medications except amlodipine. -Discontinued amlodipine in the setting of LE edema  Anxiety/depression/insomnia/tremor: Stable -Continue home medications  History of CVA -Hold home Plavix but has already received a dose this morning. -Hold home statin in the setting of LFT.  Left shoulder pain/rotator cuff injury -Has upcoming surgical appointment on 12/16.   Body mass index is 32.52 kg/m.         DVT prophylaxis:  SCD.  Allergic to pork.  Code Status: Full code Family Communication: Updated patient's significant other at bedside and daughter over the phone Status is: Inpatient  Remains inpatient appropriate because:Persistent severe electrolyte disturbances, IV treatments appropriate due to intensity of illness or inability to take PO and Inpatient level of care appropriate due to severity of illness   Dispo: The patient is from: Home              Anticipated d/c is to: Home              Anticipated d/c date is: 1 day              Patient currently is not medically stable to d/c.          Consultants:  General surgery   Sch Meds:  Scheduled Meds: . busPIRone  10 mg Oral TID  . metoprolol succinate  25 mg Oral Daily  . pantoprazole  40 mg Oral Daily  . PARoxetine  37.5 mg Oral QHS  . primidone  250 mg Oral BID  . QUEtiapine  75 mg Oral QHS  . Suvorexant  10 mg  Oral QHS   Continuous Infusions: . 0.9 % NaCl with KCl 40 mEq / L 100 mL/hr at 12/17/19 1118   PRN Meds:.acetaminophen, alum & mag hydroxide-simeth, morphine injection, nitroGLYCERIN, ondansetron (ZOFRAN) IV, traMADol  Antimicrobials: Anti-infectives (From admission, onward)   None       I have personally reviewed the following labs and images: CBC: Recent Labs  Lab 12/14/19 2313 12/16/19 1307  WBC 8.3 5.6  NEUTROABS  --  3.3  HGB 14.9 15.5  HCT 43.1 45.7  MCV 92.1 92.3  PLT 273 287   BMP &GFR Recent Labs  Lab 12/15/19 0103 12/15/19 0626 12/16/19 0512 12/17/19 0241  NA 135 134* 136 135  K 4.1 4.2 3.7 3.1*  CL 101 100 97* 93*  CO2 25 24 25 29   GLUCOSE 105* 100* 105* 143*  BUN 13 14 16  28*  CREATININE 0.97 1.02 1.28* 1.46*  CALCIUM 9.2 9.4 9.1 8.6*  MG  --   --  2.2 2.1   Estimated Creatinine Clearance: 53.7 mL/min (A) (  by C-G formula based on SCr of 1.46 mg/dL (H)). Liver & Pancreas: Recent Labs  Lab 12/15/19 0103 12/15/19 0626 12/16/19 0512 12/16/19 0742 12/17/19 0241  AST 176* 148* 179*  --  59*  ALT 75* 79* 187*  --  117*  ALKPHOS 52 55 92  --  77  BILITOT 0.5 0.8 2.5* 1.6* 0.9  PROT 6.4* 6.9 7.4  --  6.7  ALBUMIN 3.7 3.9 4.1  --  3.8   Recent Labs  Lab 12/15/19 0626 12/16/19 0742  LIPASE 45 37   No results for input(s): AMMONIA in the last 168 hours. Diabetic: Recent Labs    12/17/19 0241  HGBA1C 5.6   Recent Labs  Lab 12/15/19 1628 12/16/19 2336 12/17/19 0546  GLUCAP 89 103* 102*   Cardiac Enzymes: Recent Labs  Lab 12/15/19 0817  CKTOTAL 155   No results for input(s): PROBNP in the last 8760 hours. Coagulation Profile: No results for input(s): INR, PROTIME in the last 168 hours. Thyroid Function Tests: Recent Labs    12/15/19 1027  TSH 2.605   Lipid Profile: Recent Labs    12/17/19 0241  CHOL 176  HDL 31*  LDLCALC 109*  TRIG 181*  CHOLHDL 5.7   Anemia Panel: No results for input(s): VITAMINB12, FOLATE,  FERRITIN, TIBC, IRON, RETICCTPCT in the last 72 hours. Urine analysis:    Component Value Date/Time   COLORURINE STRAW (A) 12/15/2019 1643   APPEARANCEUR CLEAR 12/15/2019 1643   LABSPEC 1.005 12/15/2019 1643   PHURINE 7.0 12/15/2019 1643   GLUCOSEU NEGATIVE 12/15/2019 1643   HGBUR NEGATIVE 12/15/2019 [place] 12/15/2019 [place] 12/15/2019 1643   PROTEINUR NEGATIVE 12/15/2019 1643   NITRITE NEGATIVE 12/15/2019 1643   LEUKOCYTESUR NEGATIVE 12/15/2019 1643   Sepsis Labs: Invalid input(s): PROCALCITONIN, [place]  Microbiology: Recent Results (from the past 240 hour(s))  Resp Panel by RT-PCR (Flu A&B, Covid) Nasopharyngeal Swab     Status: None   Collection Time: 12/15/19  3:11 AM   Specimen: Nasopharyngeal Swab; Nasopharyngeal(NP) swabs in vial transport medium  Result Value Ref Range Status   SARS Coronavirus 2 by RT PCR NEGATIVE NEGATIVE Final    Comment: (NOTE) SARS-CoV-2 target nucleic acids are NOT DETECTED.  The SARS-CoV-2 RNA is generally detectable in upper respiratory specimens during the acute phase of infection. The lowest concentration of SARS-CoV-2 viral copies this assay can detect is 138 copies/mL. A negative result does not preclude SARS-Cov-2 infection and should not be used as the sole basis for treatment or other patient management decisions. A negative result may occur with  improper specimen collection/handling, submission of specimen other than nasopharyngeal swab, presence of viral mutation(s) within the areas targeted by this assay, and inadequate number of viral copies(<138 copies/mL). A negative result must be combined with clinical observations, patient history, and epidemiological information. The expected result is Negative.  Fact Sheet for Patients:  EntrepreneurPulse.com.au  Fact Sheet for Healthcare Providers:  IncredibleEmployment.be  This test is no t yet approved  or cleared by the [place] FDA and  has been authorized for detection and/or diagnosis of SARS-CoV-2 by FDA under an Emergency Use Authorization (EUA). This EUA will remain  in effect (meaning this test can be used) for the duration of the COVID-19 declaration under Section 564(b)(1) of the Act, 21 U.S.C.section 360bbb-3(b)(1), unless the authorization is terminated  or revoked sooner.       Influenza A by PCR NEGATIVE NEGATIVE Final   Influenza B  by PCR NEGATIVE NEGATIVE Final    Comment: (NOTE) The Xpert Xpress SARS-CoV-2/FLU/RSV plus assay is intended as an aid in the diagnosis of influenza from Nasopharyngeal swab specimens and should not be used as a sole basis for treatment. Nasal washings and aspirates are unacceptable for Xpert Xpress SARS-CoV-2/FLU/RSV testing.  Fact Sheet for Patients: EntrepreneurPulse.com.au  Fact Sheet for Healthcare Providers: IncredibleEmployment.be  This test is not yet approved or cleared by the [place] FDA and has been authorized for detection and/or diagnosis of SARS-CoV-2 by FDA under an Emergency Use Authorization (EUA). This EUA will remain in effect (meaning this test can be used) for the duration of the COVID-19 declaration under Section 564(b)(1) of the Act, 21 U.S.C. section 360bbb-3(b)(1), unless the authorization is terminated or revoked.  Performed at [facility], [place] [address] [place], [place] [address]   Surgical pcr screen     Status: None   Collection Time: 12/16/19  3:46 PM   Specimen: Nasal Mucosa; Nasal Swab  Result Value Ref Range Status   MRSA, PCR NEGATIVE NEGATIVE Final   Staphylococcus aureus NEGATIVE NEGATIVE Final    Comment: (NOTE) The Xpert SA Assay (FDA approved for NASAL specimens in patients 77 years of age and older), is one component of a comprehensive surveillance program. It is not intended to diagnose infection nor to guide or monitor  treatment. Performed at [facility], [place] [address], [place], [place] [address]     Radiology Studies: No results found.    Averianna Brugger T. Clare  If 7PM-7AM, please contact night-coverage [email or web address] 12/17/2019, 4:52 PM

## 2019-12-17 NOTE — Anesthesia Postprocedure Evaluation (Signed)
Anesthesia Post Note  Patient: [name]  Procedure(s) Performed: LAPAROSCOPIC CHOLECYSTECTOMY WITH ICG INJECTION (N/A Abdomen)     Anesthesia Type: General Level of consciousness: awake Pain management: pain level controlled Vital Signs Assessment: post-procedure vital signs reviewed and stable Respiratory status: spontaneous breathing Cardiovascular status: stable Postop Assessment: no apparent nausea or vomiting Anesthetic complications: no   No complications documented.  Last Vitals:  Vitals:   12/17/19 1030 12/17/19 1034  BP:  117/72  Pulse: 61 (!) 58  Resp: 20 19  Temp:    SpO2: 95% 94%    Last Pain:  Vitals:   12/17/19 1030  TempSrc:   PainSc: 0-No pain                 Zyairah Wacha

## 2019-12-18 ENCOUNTER — Telehealth (HOSPITAL_COMMUNITY): Payer: Self-pay | Admitting: *Deleted

## 2019-12-18 ENCOUNTER — Encounter (HOSPITAL_COMMUNITY): Payer: Self-pay | Admitting: General Surgery

## 2019-12-18 DIAGNOSIS — R079 Chest pain, unspecified: Secondary | ICD-10-CM | POA: Diagnosis not present

## 2019-12-18 DIAGNOSIS — F3342 Major depressive disorder, recurrent, in full remission: Secondary | ICD-10-CM

## 2019-12-18 LAB — COMPREHENSIVE METABOLIC PANEL
ALT: 73 U/L — ABNORMAL HIGH (ref 0–44)
AST: 40 U/L (ref 15–41)
Albumin: 3.4 g/dL — ABNORMAL LOW (ref 3.5–5.0)
Alkaline Phosphatase: 57 U/L (ref 38–126)
Anion gap: 8 (ref 5–15)
BUN: 14 mg/dL (ref 8–23)
CO2: 25 mmol/L (ref 22–32)
Calcium: 8.1 mg/dL — ABNORMAL LOW (ref 8.9–10.3)
Chloride: 103 mmol/L (ref 98–111)
Creatinine, Ser: 1.09 mg/dL (ref 0.61–1.24)
GFR, Estimated: >60 mL/min (ref >60)
Glucose, Bld: 89 mg/dL (ref 70–99)
Potassium: 4.2 mmol/L (ref 3.5–5.1)
Sodium: 136 mmol/L (ref 135–145)
Total Bilirubin: 0.6 mg/dL (ref 0.3–1.2)
Total Protein: 6.2 g/dL — ABNORMAL LOW (ref 6.5–8.1)

## 2019-12-18 LAB — PHOSPHORUS: Phosphorus: 3.2 mg/dL (ref 2.5–4.6)

## 2019-12-18 LAB — GLUCOSE, CAPILLARY: Glucose-Capillary: 125 mg/dL — ABNORMAL HIGH (ref 70–99)

## 2019-12-18 LAB — MAGNESIUM: Magnesium: 2 mg/dL (ref 1.7–2.4)

## 2019-12-18 LAB — SURGICAL PATHOLOGY

## 2019-12-18 MED ORDER — ONDANSETRON HCL 4 MG PO TABS: 4.0000 mg | ORAL_TABLET | Freq: Three times a day (TID) | ORAL | 5 refills | Status: DC | PRN

## 2019-12-18 MED ORDER — TRAMADOL HCL 50 MG PO TABS
100.0000 mg | ORAL_TABLET | Freq: Four times a day (QID) | ORAL | 0 refills | Status: DC | PRN
Start: 2019-12-18 — End: 2020-01-07

## 2019-12-18 MED ORDER — CLOPIDOGREL BISULFATE 75 MG PO TABS: 75.0000 mg | ORAL_TABLET | Freq: Every day | ORAL | 11 refills | Status: DC

## 2019-12-18 MED ORDER — DOCUSATE SODIUM 100 MG PO CAPS
100.0000 mg | ORAL_CAPSULE | Freq: Two times a day (BID) | ORAL | Status: DC
  Administered 2019-12-18: 100 mg via ORAL
  Filled 2019-12-18: qty 1

## 2019-12-18 MED ORDER — AMLODIPINE BESYLATE 10 MG PO TABS: 5.0000 mg | ORAL_TABLET | Freq: Every day | ORAL | 1 refills | Status: DC

## 2019-12-18 MED ORDER — BELSOMRA 10 MG PO TABS: 10.0000 mg | ORAL_TABLET | Freq: Every day | ORAL | 1 refills | Status: DC

## 2019-12-18 MED ORDER — FUROSEMIDE 20 MG PO TABS: 20.0000 mg | ORAL_TABLET | Freq: Every day | ORAL | 0 refills | Status: DC

## 2019-12-18 NOTE — Discharge Summary (Addendum)
Physician Discharge Summary  [name] QQP:[id number] DOB: May 14, 1950 DOA: 12/14/2019  PCP: Orpah Melter, MD  Admit date: 12/14/2019 .  Discharge date: 12/18/2019  Admitted From:  Home  Disposition:  Home.  Recommendations for Outpatient Follow-up:  1. Follow up with PCP in 1-2 weeks. 2. Please obtain CMP/CBC in one week. 3. Advised to resume plavix in three- four days. 4. Advised to follow up General surgery in 2 weeks. 5. Advised to take lasix as needed for pedal swelling. 6. Advised PCP to uptitrate BP medications.  Home Health: None. Equipment/Devices: None.  Discharge Condition: Stable CODE STATUS:Full code Diet recommendation: Heart Healthy   Brief Summary / Hospital course: This 69 year old male with medical history significant of CVA, HTN, anxiety, depression, GERD and left shoulder pain who presented to ED by EMS due to acute chest pain that woke him up from sleep.  Reportedly, chest pain resolved with nitro prior to arrival to ED. He also reports nausea, weight gain and BLE edema recently..  In the ED, he was afebrile.  Hemodynamically stable.  97% on RA.  CBC within normal.  Mildly elevated liver enzymes on CMP.  Initial troponin negative.  EKG with sinus rhythm, RBBB, LAFB, LVH and PRWP.  Two view CXR with right base airspace opacity likely represents compressive atelectasis.  RUQ ultrasound and TTE ordered.  Admitted for chest pain work-up. Serial troponins negative.  TTE with normal LVEF, Grade 2-Diastolic Dysfunction, BNP within normal. Didn't tolerate trial of IV Lasix.  Chest pain resolved. RUQ ultrasound with cholelithiasis and biliary sludge but no signs of cholecystitis.  General surgery consulted.  MRCP with cholelithiasis and gallbladder wall thickening but no choledocholithiasis.  He underwent laparoscopic cholecystectomy on 12/17/2019.  Patient reports feeling better denies any nausea and vomiting but he still has some abdominal soreness.  Patient was  cleared from general surgery to be discharged.  Patient blood pressure remains controlled but has developed bilateral lower extremity edema,  amlodipine10 mg was discontinued.  Leg swelling has improved.  Patient was advised to start amlodipine 5 mg daily and follow-up with PCP for adjustment in his blood pressure medications.  Patient was advised to resume Plavix in 3 to 4 days after surgery.  Advised follow-up with general surgery in 2 weeks   He was managed for below problems.   Discharge Diagnoses:  Principal Problem:   Chest pain Active Problems:   History of stroke   MDD (major depressive disorder), recurrent, in partial remission (HCC)   Elevated transaminase level   Right upper quadrant abdominal pain  Atypical chest pain: Resolved.  Cardiac work-up reassuring so far.  Initial EKG, serial troponin and BNP and TTE without acute findings.  TTE with normal LVEF, G2-DD, severe LAE, moderate RAE and normal pulmonary pressures on TTE but no RWMA.  He has bilateral trace to 1+ edema likely from amlodipine versus CHF.  No orthopnea, PND or crackles.  Edema improved with trial of IV Lasix but creatinine trended up -He is already on metoprolol, Plavix and a statin. -May consider as needed Lasix on discharge. -Could benefit from outpatient cardiac evaluation such as stress test.   Epigastric/RUQ pain likely symptomatic cholelithiasis and possible chronic cholecystitis:  -Status post laparoscopic cholecystectomy on 12/17/2019. -LFT improved. -Resume p.o. Plavix in the morning.   Elevated transaminases/hyperbilirubinemia:  RUQ ultrasound and MRCP as above.  Acute hepatitis and HIV nonreactive.  LFT improved.  Hyperbilirubinemia resolved. -Continue monitoring   Weight-gain and leg swelling: TTE as above.  No respiratory  symptoms.  No fluid on CXR. BNP within normal.  No anemia, hypoalbuminemia, proteinuria or hypothyroidism.  Edema could be due to amlodipine, and improved with IV Lasix.   -Discontinue amlodipine. -Consider p.o. Lasix as needed outpatient. -Monitor fluid status and renal function.   AKI/mild azotemia: Multifactorial including diuretics, ARB and nausea and vomiting.  Received a dose of Lasix 2 days ago. -Diuretics discontinued -Discontinued ARB. -Gentle IV fluid -Recheck in the morning   Hypokalemia: K3.1.  Mg within normal. -Replenish and recheck.   Uncontrolled hypertension: Now normotensive. -Resume home medications except amlodipine. -Discontinued amlodipine in the setting of LE edema   Anxiety/depression/insomnia/tremor: Stable -Continue home medications   History of CVA -Resumed plavix  3-4 days post surgery. -Hold home statin in the setting of LFT.   Left shoulder pain/rotator cuff injury -Has upcoming surgical appointment on 12/16.    Discharge Instructions  Discharge Instructions    Call MD for:  difficulty breathing, headache or visual disturbances   Complete by: As directed    Call MD for:  persistant dizziness or light-headedness   Complete by: As directed    Call MD for:  persistant nausea and vomiting   Complete by: As directed    Call MD for:  temperature >100.4   Complete by: As directed    Diet - low sodium heart healthy   Complete by: As directed    Diet Carb Modified   Complete by: As directed    Discharge instructions   Complete by: As directed    Advised to follow up PCP in one week. Advised to resume plavix in three days. Advised to follow up General surgery in 2 weeks.   Discharge wound care:   Complete by: As directed    Follow up General surgery in 2 weeks.   Increase activity slowly   Complete by: As directed      Allergies as of 12/18/2019      Reactions   Aspirin Other (See Comments)   Dizziness, nausea and vomiting Balance issues   Lisinopril Other (See Comments)   Critical Cough   Lithium Other (See Comments)   Critical Per pt reaction unknown   Oxycodone Nausea And Vomiting    Oxycodone-acetaminophen Nausea And Vomiting   Also causes dizziness   Pork-derived Products Other (See Comments)   Unspecified reaction      Medication List    TAKE these medications   amLODipine 10 MG tablet Commonly known as: NORVASC Take 0.5 tablets (5 mg total) by mouth daily. What changed: how much to take   Belsomra 10 MG Tabs Generic drug: Suvorexant Take 10 mg by mouth at bedtime.   busPIRone 10 MG tablet Commonly known as: BUSPAR Take 1 tablet (10 mg total) by mouth 3 (three) times daily.   candesartan 32 MG tablet Commonly known as: ATACAND Take 32 mg by mouth daily.   clopidogrel 75 MG tablet Commonly known as: PLAVIX Take 1 tablet (75 mg total) by mouth daily.   furosemide 20 MG tablet Commonly known as: Lasix Take 1 tablet (20 mg total) by mouth daily.   Ingrezza 80 MG Caps Generic drug: Valbenazine Tosylate Take 80 mg by mouth in the morning.   lovastatin 20 MG tablet Commonly known as: MEVACOR Take 20 mg by mouth daily.   metoprolol succinate 25 MG 24 hr tablet Commonly known as: TOPROL-XL Take 25 mg by mouth daily.   ondansetron 4 MG tablet Commonly known as: ZOFRAN Take 1 tablet (4 mg total) by  mouth every 8 (eight) hours as needed for nausea.   PARoxetine 37.5 MG 24 hr tablet Commonly known as: PAXIL-CR TAKE 1 TABLET (37.5 MG TOTAL) BY MOUTH AT BEDTIME.   primidone 250 MG tablet Commonly known as: MYSOLINE TAKE 1 TABLET BY MOUTH TWICE A DAY   QUEtiapine 25 MG tablet Commonly known as: SEROQUEL Take 3 tablets at bedtime (75 mg)   traMADol 50 MG tablet Commonly known as: ULTRAM Take 2 tablets (100 mg total) by mouth every 6 (six) hours as needed.   valACYclovir 1000 MG tablet Commonly known as: VALTREX Take 1,000 mg by mouth daily.            Discharge Care Instructions  (From admission, onward)         Start     Ordered   12/18/19 0000  Discharge wound care:       Comments: Follow up General surgery in 2 weeks.    12/18/19 1114          Follow-up Information    Orpah Melter, MD. Schedule an appointment as soon as possible for a visit in 1 week(s).   Specialty: Family Medicine Contact information: [place] [address] [phone]        Surgery, [place] Follow up on 01/07/2020.   Specialty: General Surgery Why: 9:15am, 8:45am for paperwork and check in process Contact information: [address] [place] [address] [phone]              Allergies  Allergen Reactions  . Aspirin Other (See Comments)    Dizziness, nausea and vomiting Balance issues  . Lisinopril Other (See Comments)    Critical Cough  . Lithium Other (See Comments)    Critical Per pt reaction unknown  . Oxycodone Nausea And Vomiting  . Oxycodone-Acetaminophen Nausea And Vomiting    Also causes dizziness  . Pork-Derived Products Other (See Comments)    Unspecified reaction    Consultations:  General Surgery   Procedures/Studies: DG Chest 2 View  Result Date: 12/14/2019 CLINICAL DATA:  Chest pain, left lower. EXAM: CHEST - 2 VIEW COMPARISON:  Chest x-ray 12/28/2009 FINDINGS: The heart size and mediastinal contours are within normal limits. Aortic arch calcifications. Right base airspace opacity. No focal consolidation. No pulmonary edema. No pleural effusion. No pneumothorax. Persistent elevation of the left hemidiaphragm. No acute osseous abnormality. Multilevel degenerative changes of the spine with a midthoracic vertebral body demonstrating vertebral body height loss. IMPRESSION: 1. Right base airspace opacity likely represents compressive changes. Overlying infection/inflammation not excluded. 2. Age-indeterminate midthoracic vertebral body compression fracture. Correlate with point tenderness to evaluate for acute component. Electronically Signed   By: Iven Finn M.D.   On: 12/14/2019 23:45   MR 3D Recon At Scanner  Result Date:  12/16/2019 CLINICAL DATA:  Right upper quadrant abdominal pain, cholelithiasis, elevated liver enzymes. EXAM: MRI ABDOMEN WITHOUT AND WITH CONTRAST (INCLUDING MRCP) TECHNIQUE: Multiplanar multisequence MR imaging of the abdomen was performed both before and after the administration of intravenous contrast. Heavily T2-weighted images of the biliary and pancreatic ducts were obtained, and three-dimensional MRCP images were rendered by post processing. CONTRAST:  8mL GADAVIST GADOBUTROL 1 MMOL/ML IV SOLN COMPARISON:  Abdominal ultrasound 12/15/2019 FINDINGS: Despite efforts by the technologist and patient, motion artifact is present on today's exam and could not be eliminated. This reduces exam sensitivity and specificity. Lower chest: Unremarkable Hepatobiliary: Numerous scattered cysts in the liver of varying sizes. Small filling  defects in the gallbladder as on image 20 of series 8 compatible with at least 2 small gallstones. Borderline appearance for gallbladder wall thickening. No biliary dilatation. No observed filling defect in the biliary tree, to the limits allowed by the motion artifact. Very minimal dropout of signal on out of phase images in the liver may reflect minimal hepatic steatosis. No significant abnormal hepatic enhancement. Pancreas:  Unremarkable Spleen:  Unremarkable Adrenals/Urinary Tract: Small bilateral renal cysts. The left kidney upper pole posterior exophytic cyst on image 60 of series 1600 measures 1.4 by 1.3 cm and is mildly complex but does not enhance, compatible with a small Bosniak category 2 cyst. This is benign and does not require further workup. Adrenal glands unremarkable. Stomach/Bowel: Unremarkable Vascular/Lymphatic: Aortoiliac atherosclerotic vascular disease. No pathologic adenopathy identified. Other:  No supplemental non-categorized findings. Musculoskeletal: Dextroconvex thoracolumbar scoliosis. Postoperative findings at L5-S1 seen on the coronal images. IMPRESSION: 1.  Cholelithiasis with borderline appearance for gallbladder wall thickening. No biliary dilatation or evidence of choledocholithiasis. 2. Numerous scattered cysts in the liver. 3. Very minimal dropout of signal on out of phase images in the liver may reflect minimal hepatic steatosis. 4. Dextroconvex thoracolumbar scoliosis. Postoperative findings at L5-S1. 5.  Aortic Atherosclerosis (ICD10-I70.0). 6. Benign-appearing bilateral renal cysts. Electronically Signed   By: Van Clines M.D.   On: 12/16/2019 10:41   MR ABDOMEN MRCP W WO CONTAST  Result Date: 12/16/2019 CLINICAL DATA:  Right upper quadrant abdominal pain, cholelithiasis, elevated liver enzymes. EXAM: MRI ABDOMEN WITHOUT AND WITH CONTRAST (INCLUDING MRCP) TECHNIQUE: Multiplanar multisequence MR imaging of the abdomen was performed both before and after the administration of intravenous contrast. Heavily T2-weighted images of the biliary and pancreatic ducts were obtained, and three-dimensional MRCP images were rendered by post processing. CONTRAST:  18mL GADAVIST GADOBUTROL 1 MMOL/ML IV SOLN COMPARISON:  Abdominal ultrasound 12/15/2019 FINDINGS: Despite efforts by the technologist and patient, motion artifact is present on today's exam and could not be eliminated. This reduces exam sensitivity and specificity. Lower chest: Unremarkable Hepatobiliary: Numerous scattered cysts in the liver of varying sizes. Small filling defects in the gallbladder as on image 20 of series 8 compatible with at least 2 small gallstones. Borderline appearance for gallbladder wall thickening. No biliary dilatation. No observed filling defect in the biliary tree, to the limits allowed by the motion artifact. Very minimal dropout of signal on out of phase images in the liver may reflect minimal hepatic steatosis. No significant abnormal hepatic enhancement. Pancreas:  Unremarkable Spleen:  Unremarkable Adrenals/Urinary Tract: Small bilateral renal cysts. The left kidney  upper pole posterior exophytic cyst on image 60 of series 1600 measures 1.4 by 1.3 cm and is mildly complex but does not enhance, compatible with a small Bosniak category 2 cyst. This is benign and does not require further workup. Adrenal glands unremarkable. Stomach/Bowel: Unremarkable Vascular/Lymphatic: Aortoiliac atherosclerotic vascular disease. No pathologic adenopathy identified. Other:  No supplemental non-categorized findings. Musculoskeletal: Dextroconvex thoracolumbar scoliosis. Postoperative findings at L5-S1 seen on the coronal images. IMPRESSION: 1. Cholelithiasis with borderline appearance for gallbladder wall thickening. No biliary dilatation or evidence of choledocholithiasis. 2. Numerous scattered cysts in the liver. 3. Very minimal dropout of signal on out of phase images in the liver may reflect minimal hepatic steatosis. 4. Dextroconvex thoracolumbar scoliosis. Postoperative findings at L5-S1. 5.  Aortic Atherosclerosis (ICD10-I70.0). 6. Benign-appearing bilateral renal cysts. Electronically Signed   By: Van Clines M.D.   On: 12/16/2019 10:41   ECHOCARDIOGRAM COMPLETE  Result Date: 12/15/2019  ECHOCARDIOGRAM REPORT   Patient Name:   [name] Date of Exam: 12/15/2019 Medical Rec #:  [id number]       Height:       67.0 in Accession #:    [id number]      Weight:       205.7 lb Date of Birth:  [birth year]       BSA:          2.046 m Patient Age:    70 years        BP:           148/84 mmHg Patient Gender: M               HR:           65 bpm. Exam Location:  Inpatient Procedure: 2D Echo, Cardiac Doppler and Color Doppler Indications:    R07.9* Chest pain, unspecified  History:        Patient has prior history of Echocardiogram examinations, most                 recent 12/21/2009.  Sonographer:    Merrie Roof RDCS Referring Phys: [id number] [place]  1. Left ventricular ejection fraction, by estimation, is 60 to 65%. The left ventricle has normal function. The left  ventricle has no regional wall motion abnormalities. There is mild concentric left ventricular hypertrophy. Left ventricular diastolic parameters are consistent with Grade II diastolic dysfunction (pseudonormalization). Elevated left atrial pressure.  2. Right ventricular systolic function is normal. The right ventricular size is mildly enlarged. There is normal pulmonary artery systolic pressure.  3. Left atrial size was severely dilated.  4. Right atrial size was moderately dilated.  5. The mitral valve is normal in structure. No evidence of mitral valve regurgitation. No evidence of mitral stenosis.  6. The aortic valve is normal in structure. Aortic valve regurgitation is not visualized. Mild aortic valve sclerosis is present, with no evidence of aortic valve stenosis.  7. The inferior vena cava is normal in size with greater than 50% respiratory variability, suggesting right atrial pressure of 3 mmHg. Comparison(s): Prior images unable to be directly viewed, comparison made by report only. The left atrium is now severely dilated and there is evidence of worsened diastolic function (increased left atrial pressure). FINDINGS  Left Ventricle: Left ventricular ejection fraction, by estimation, is 60 to 65%. The left ventricle has normal function. The left ventricle has no regional wall motion abnormalities. The left ventricular internal cavity size was normal in size. There is  mild concentric left ventricular hypertrophy. Left ventricular diastolic parameters are consistent with Grade II diastolic dysfunction (pseudonormalization). Elevated left atrial pressure. Right Ventricle: The right ventricular size is mildly enlarged. No increase in right ventricular wall thickness. Right ventricular systolic function is normal. There is normal pulmonary artery systolic pressure. The tricuspid regurgitant velocity is 2.24  m/s, and with an assumed right atrial pressure of 3 mmHg, the estimated right ventricular systolic  pressure is 46.2 mmHg. Left Atrium: Left atrial size was severely dilated. Right Atrium: Right atrial size was moderately dilated. Pericardium: There is no evidence of pericardial effusion. Mitral Valve: The mitral valve is normal in structure. Mild mitral annular calcification. No evidence of mitral valve regurgitation. No evidence of mitral valve stenosis. Tricuspid Valve: The tricuspid valve is normal in structure. Tricuspid valve regurgitation is trivial. No evidence of tricuspid stenosis. Aortic Valve: The aortic valve is normal in structure. Aortic valve regurgitation is not visualized.  Mild aortic valve sclerosis is present, with no evidence of aortic valve stenosis. Pulmonic Valve: The pulmonic valve was normal in structure. Pulmonic valve regurgitation is not visualized. No evidence of pulmonic stenosis. Aorta: The aortic root is normal in size and structure. Venous: The inferior vena cava is normal in size with greater than 50% respiratory variability, suggesting right atrial pressure of 3 mmHg. IAS/Shunts: No atrial level shunt detected by color flow Doppler.  LEFT VENTRICLE PLAX 2D LVIDd:         4.50 cm  Diastology LVIDs:         3.20 cm  LV e' medial:    4.46 cm/s LV PW:         0.90 cm  LV E/e' medial:  23.3 LV IVS:        1.20 cm  LV e' lateral:   6.42 cm/s LVOT diam:     2.10 cm  LV E/e' lateral: 16.2 LV SV:         104 LV SV Index:   51 LVOT Area:     3.46 cm  RIGHT VENTRICLE RV Basal diam:  4.80 cm RV Mid diam:    4.30 cm LEFT ATRIUM              Index       RIGHT ATRIUM           Index LA diam:        5.00 cm  2.44 cm/m  RA Area:     22.20 cm LA Vol (A2C):   148.0 ml 72.32 ml/m RA Volume:   80.90 ml  39.53 ml/m LA Vol (A4C):   91.1 ml  44.52 ml/m LA Biplane Vol: 120.0 ml 58.64 ml/m  AORTIC VALVE LVOT Vmax:   129.00 cm/s LVOT Vmean:  90.400 cm/s LVOT VTI:    0.301 m  AORTA Ao Root diam: 3.40 cm Ao Asc diam:  3.20 cm MITRAL VALVE                TRICUSPID VALVE MV Area (PHT): 3.83 cm     TR  Peak grad:   20.1 mmHg MV Decel Time: 198 msec     TR Vmax:        224.00 cm/s MV E velocity: 104.00 cm/s MV A velocity: 89.50 cm/s   SHUNTS MV E/A ratio:  1.16         Systemic VTI:  0.30 m                             Systemic Diam: 2.10 cm Dani Gobble Croitoru MD Electronically signed by Sanda Klein MD Signature Date/Time: 12/15/2019/10:58:08 AM    Final    US Abdomen Limited RUQ (LIVER/GB)  Result Date: 12/15/2019 CLINICAL DATA:  Elevated transaminase levels. EXAM: ULTRASOUND ABDOMEN LIMITED RIGHT UPPER QUADRANT COMPARISON:  None. FINDINGS: Gallbladder: Mobile stones and sludge are demonstrated in the gallbladder. No gallbladder wall thickening or edema. Murphy's sign is negative. Common bile duct: Diameter: 3 mm, normal Liver: Multiple circumscribed cysts are demonstrated in the liver, largest in the left lobe measuring 3 cm maximal diameter. Parenchyma are otherwise unremarkable. Portal vein is patent on color Doppler imaging with normal direction of blood flow towards the liver. Other: None. IMPRESSION: 1. Cholelithiasis and sludge in the gallbladder. No additional changes to suggest acute cholecystitis. 2. Multiple benign-appearing hepatic cysts. Electronically Signed   By: Lucienne Capers M.D.   On: 12/15/2019 04:43  Laparoscopic cholecystectomy   Subjective: Patient was seen and examined at bedside.  Overnight events noted. Patient feels much better.    He has tolerated diet , still has some soreness in the abdominal exam.  Wants to be discharged home.  Discharge Exam: Vitals:   12/18/19 0821 12/18/19 0842  BP: (!) 147/93   Pulse: 69 61  Resp: 15   Temp: 98.5 F (36.9 C)   SpO2:     Vitals:   12/18/19 0441 12/18/19 0500 12/18/19 0821 12/18/19 0842  BP: (!) 157/90  (!) 147/93   Pulse: 65  69 61  Resp: 18  15   Temp: 97.6 F (36.4 C)  98.5 F (36.9 C)   TempSrc: Oral  Oral   SpO2: 94%     Weight:  94 kg    Height:        General: Pt is alert, awake, not in acute  distress Cardiovascular: RRR, S1/S2 +, no rubs, no gallops Respiratory: CTA bilaterally, no wheezing, no rhonchi Abdominal: Soft, Mild tenderness noted,  ND, bowel sounds + Extremities: no edema, no cyanosis    The results of significant diagnostics from this hospitalization (including imaging, microbiology, ancillary and laboratory) are listed below for reference.     Microbiology: Recent Results (from the past 240 hour(s))  Resp Panel by RT-PCR (Flu A&B, Covid) Nasopharyngeal Swab     Status: None   Collection Time: 12/15/19  3:11 AM   Specimen: Nasopharyngeal Swab; Nasopharyngeal(NP) swabs in vial transport medium  Result Value Ref Range Status   SARS Coronavirus 2 by RT PCR NEGATIVE NEGATIVE Final    Comment: (NOTE) SARS-CoV-2 target nucleic acids are NOT DETECTED.  The SARS-CoV-2 RNA is generally detectable in upper respiratory specimens during the acute phase of infection. The lowest concentration of SARS-CoV-2 viral copies this assay can detect is 138 copies/mL. A negative result does not preclude SARS-Cov-2 infection and should not be used as the sole basis for treatment or other patient management decisions. A negative result may occur with  improper specimen collection/handling, submission of specimen other than nasopharyngeal swab, presence of viral mutation(s) within the areas targeted by this assay, and inadequate number of viral copies(<138 copies/mL). A negative result must be combined with clinical observations, patient history, and epidemiological information. The expected result is Negative.  Fact Sheet for Patients:  EntrepreneurPulse.com.au  Fact Sheet for Healthcare Providers:  IncredibleEmployment.be  This test is no t yet approved or cleared by the [place] FDA and  has been authorized for detection and/or diagnosis of SARS-CoV-2 by FDA under an Emergency Use Authorization (EUA). This EUA will remain  in effect  (meaning this test can be used) for the duration of the COVID-19 declaration under Section 564(b)(1) of the Act, 21 U.S.C.section 360bbb-3(b)(1), unless the authorization is terminated  or revoked sooner.       Influenza A by PCR NEGATIVE NEGATIVE Final   Influenza B by PCR NEGATIVE NEGATIVE Final    Comment: (NOTE) The Xpert Xpress SARS-CoV-2/FLU/RSV plus assay is intended as an aid in the diagnosis of influenza from Nasopharyngeal swab specimens and should not be used as a sole basis for treatment. Nasal washings and aspirates are unacceptable for Xpert Xpress SARS-CoV-2/FLU/RSV testing.  Fact Sheet for Patients: EntrepreneurPulse.com.au  Fact Sheet for Healthcare Providers: IncredibleEmployment.be  This test is not yet approved or cleared by the [place] FDA and has been authorized for detection and/or diagnosis of SARS-CoV-2 by FDA under an Emergency Use Authorization (EUA). This EUA  will remain in effect (meaning this test can be used) for the duration of the COVID-19 declaration under Section 564(b)(1) of the Act, 21 U.S.C. section 360bbb-3(b)(1), unless the authorization is terminated or revoked.  Performed at [facility], [place] [address] [place], [place] [address]   Surgical pcr screen     Status: None   Collection Time: 12/16/19  3:46 PM   Specimen: Nasal Mucosa; Nasal Swab  Result Value Ref Range Status   MRSA, PCR NEGATIVE NEGATIVE Final   Staphylococcus aureus NEGATIVE NEGATIVE Final    Comment: (NOTE) The Xpert SA Assay (FDA approved for NASAL specimens in patients 59 years of age and older), is one component of a comprehensive surveillance program. It is not intended to diagnose infection nor to guide or monitor treatment. Performed at [facility], [place] [address], [place], [place] [address]      Labs: BNP (last 3 results) Recent Labs    12/15/19 1027  BNP 20.3   Basic Metabolic Panel: Recent  Labs  Lab 12/15/19 0103 12/15/19 0626 12/16/19 0512 12/17/19 0241 12/18/19 0343  NA 135 134* 136 135 136  K 4.1 4.2 3.7 3.1* 4.2  CL 101 100 97* 93* 103  CO2 25 24 25 29 25   GLUCOSE 105* 100* 105* 143* 89  BUN 13 14 16  28* 14  CREATININE 0.97 1.02 1.28* 1.46* 1.09  CALCIUM 9.2 9.4 9.1 8.6* 8.1*  MG  --   --  2.2 2.1 2.0  PHOS  --   --   --   --  3.2   Liver Function Tests: Recent Labs  Lab 12/15/19 0103 12/15/19 0103 12/15/19 0626 12/16/19 0512 12/16/19 0742 12/17/19 0241 12/18/19 0343  AST 176*  --  148* 179*  --  59* 40  ALT 75*  --  79* 187*  --  117* 73*  ALKPHOS 52  --  55 92  --  77 57  BILITOT 0.5   < > 0.8 2.5* 1.6* 0.9 0.6  PROT 6.4*  --  6.9 7.4  --  6.7 6.2*  ALBUMIN 3.7  --  3.9 4.1  --  3.8 3.4*   < > = values in this interval not displayed.   Recent Labs  Lab 12/15/19 0626 12/16/19 0742  LIPASE 45 37   No results for input(s): AMMONIA in the last 168 hours. CBC: Recent Labs  Lab 12/14/19 2313 12/16/19 1307  WBC 8.3 5.6  NEUTROABS  --  3.3  HGB 14.9 15.5  HCT 43.1 45.7  MCV 92.1 92.3  PLT 273 287   Cardiac Enzymes: Recent Labs  Lab 12/15/19 0817  CKTOTAL 155   BNP: Invalid input(s): POCBNP CBG: Recent Labs  Lab 12/15/19 1628 12/16/19 2336 12/17/19 0546 12/18/19 0056  GLUCAP 89 103* 102* 125*   D-Dimer No results for input(s): DDIMER in the last 72 hours. Hgb A1c Recent Labs    12/17/19 0241  HGBA1C 5.6   Lipid Profile Recent Labs    12/17/19 0241  CHOL 176  HDL 31*  LDLCALC 109*  TRIG 181*  CHOLHDL 5.7   Thyroid function studies No results for input(s): TSH, T4TOTAL, T3FREE, THYROIDAB in the last 72 hours.  Invalid input(s): FREET3 Anemia work up No results for input(s): VITAMINB12, FOLATE, FERRITIN, TIBC, IRON, RETICCTPCT in the last 72 hours. Urinalysis    Component Value Date/Time   COLORURINE STRAW (A) 12/15/2019 1643   APPEARANCEUR CLEAR 12/15/2019 1643   LABSPEC 1.005 12/15/2019 1643  PHURINE 7.0  12/15/2019 1643   GLUCOSEU NEGATIVE 12/15/2019 1643   HGBUR NEGATIVE 12/15/2019 [place] 12/15/2019 [place] 12/15/2019 1643   PROTEINUR NEGATIVE 12/15/2019 1643   NITRITE NEGATIVE 12/15/2019 1643   LEUKOCYTESUR NEGATIVE 12/15/2019 1643   Sepsis Labs Invalid input(s): PROCALCITONIN,  WBC,  LACTICIDVEN Microbiology Recent Results (from the past 240 hour(s))  Resp Panel by RT-PCR (Flu A&B, Covid) Nasopharyngeal Swab     Status: None   Collection Time: 12/15/19  3:11 AM   Specimen: Nasopharyngeal Swab; Nasopharyngeal(NP) swabs in vial transport medium  Result Value Ref Range Status   SARS Coronavirus 2 by RT PCR NEGATIVE NEGATIVE Final    Comment: (NOTE) SARS-CoV-2 target nucleic acids are NOT DETECTED.  The SARS-CoV-2 RNA is generally detectable in upper respiratory specimens during the acute phase of infection. The lowest concentration of SARS-CoV-2 viral copies this assay can detect is 138 copies/mL. A negative result does not preclude SARS-Cov-2 infection and should not be used as the sole basis for treatment or other patient management decisions. A negative result may occur with  improper specimen collection/handling, submission of specimen other than nasopharyngeal swab, presence of viral mutation(s) within the areas targeted by this assay, and inadequate number of viral copies(<138 copies/mL). A negative result must be combined with clinical observations, patient history, and epidemiological information. The expected result is Negative.  Fact Sheet for Patients:  EntrepreneurPulse.com.au  Fact Sheet for Healthcare Providers:  IncredibleEmployment.be  This test is no t yet approved or cleared by the [place] FDA and  has been authorized for detection and/or diagnosis of SARS-CoV-2 by FDA under an Emergency Use Authorization (EUA). This EUA will remain  in effect (meaning this test can be used)  for the duration of the COVID-19 declaration under Section 564(b)(1) of the Act, 21 U.S.C.section 360bbb-3(b)(1), unless the authorization is terminated  or revoked sooner.       Influenza A by PCR NEGATIVE NEGATIVE Final   Influenza B by PCR NEGATIVE NEGATIVE Final    Comment: (NOTE) The Xpert Xpress SARS-CoV-2/FLU/RSV plus assay is intended as an aid in the diagnosis of influenza from Nasopharyngeal swab specimens and should not be used as a sole basis for treatment. Nasal washings and aspirates are unacceptable for Xpert Xpress SARS-CoV-2/FLU/RSV testing.  Fact Sheet for Patients: EntrepreneurPulse.com.au  Fact Sheet for Healthcare Providers: IncredibleEmployment.be  This test is not yet approved or cleared by the [place] FDA and has been authorized for detection and/or diagnosis of SARS-CoV-2 by FDA under an Emergency Use Authorization (EUA). This EUA will remain in effect (meaning this test can be used) for the duration of the COVID-19 declaration under Section 564(b)(1) of the Act, 21 U.S.C. section 360bbb-3(b)(1), unless the authorization is terminated or revoked.  Performed at [facility], [place] [address] [place], [place] [address]   Surgical pcr screen     Status: None   Collection Time: 12/16/19  3:46 PM   Specimen: Nasal Mucosa; Nasal Swab  Result Value Ref Range Status   MRSA, PCR NEGATIVE NEGATIVE Final   Staphylococcus aureus NEGATIVE NEGATIVE Final    Comment: (NOTE) The Xpert SA Assay (FDA approved for NASAL specimens in patients 79 years of age and older), is one component of a comprehensive surveillance program. It is not intended to diagnose infection nor to guide or monitor treatment. Performed at [facility], [place] [address], [place], [place] [address]      Time coordinating  discharge: Over 30 minutes  SIGNED:   Shawna Clamp, MD  [facility] 12/18/2019, 1:53 PM Pager   If  7PM-7AM, please contact night-coverage [email or web address]

## 2019-12-18 NOTE — Progress Notes (Signed)
Patient complained of lightheadedness when he is up moving.  Dr Dwyane Dee came to assess patient and states he can discharge.

## 2019-12-18 NOTE — Progress Notes (Signed)
1 Day Post-Op   Subjective/Chief Complaint: Sore, tol diet, ambulating   Objective: Vital signs in last 24 hours: Temp:  [97.2 F (36.2 C)-98.5 F (36.9 C)] 97.6 F (36.4 C) (12/01 0441) Pulse Rate:  [57-76] 65 (12/01 0441) Resp:  [13-20] 18 (12/01 0441) BP: (117-176)/(67-98) 157/90 (12/01 0441) SpO2:  [94 %-98 %] 94 % (12/01 0441) Weight:  [94 kg] 94 kg (12/01 0500) Last BM Date: 12/18/19  Intake/Output from previous day: 11/30 0701 - 12/01 0700 In: 2796.6 [P.O.:350; I.V.:2396.6; IV Piggyback:50] Out: 1410 [Urine:1400; Blood:10] Intake/Output this shift: No intake/output data recorded.  GI: soft approp tender incisions clean  Lab Results:  Recent Labs    12/16/19 1307  WBC 5.6  HGB 15.5  HCT 45.7  PLT 287   BMET Recent Labs    12/17/19 0241 12/18/19 0343  NA 135 136  K 3.1* 4.2  CL 93* 103  CO2 29 25  GLUCOSE 143* 89  BUN 28* 14  CREATININE 1.46* 1.09  CALCIUM 8.6* 8.1*   PT/INR No results for input(s): LABPROT, INR in the last 72 hours. ABG No results for input(s): PHART, HCO3 in the last 72 hours.  Invalid input(s): PCO2, PO2  Studies/Results: MR 3D Recon At Scanner  Result Date: 12/16/2019 CLINICAL DATA:  Right upper quadrant abdominal pain, cholelithiasis, elevated liver enzymes. EXAM: MRI ABDOMEN WITHOUT AND WITH CONTRAST (INCLUDING MRCP) TECHNIQUE: Multiplanar multisequence MR imaging of the abdomen was performed both before and after the administration of intravenous contrast. Heavily T2-weighted images of the biliary and pancreatic ducts were obtained, and three-dimensional MRCP images were rendered by post processing. CONTRAST:  78mL GADAVIST GADOBUTROL 1 MMOL/ML IV SOLN COMPARISON:  Abdominal ultrasound 12/15/2019 FINDINGS: Despite efforts by the technologist and patient, motion artifact is present on today's exam and could not be eliminated. This reduces exam sensitivity and specificity. Lower chest: Unremarkable Hepatobiliary: Numerous  scattered cysts in the liver of varying sizes. Small filling defects in the gallbladder as on image 20 of series 8 compatible with at least 2 small gallstones. Borderline appearance for gallbladder wall thickening. No biliary dilatation. No observed filling defect in the biliary tree, to the limits allowed by the motion artifact. Very minimal dropout of signal on out of phase images in the liver may reflect minimal hepatic steatosis. No significant abnormal hepatic enhancement. Pancreas:  Unremarkable Spleen:  Unremarkable Adrenals/Urinary Tract: Small bilateral renal cysts. The left kidney upper pole posterior exophytic cyst on image 60 of series 1600 measures 1.4 by 1.3 cm and is mildly complex but does not enhance, compatible with a small Bosniak category 2 cyst. This is benign and does not require further workup. Adrenal glands unremarkable. Stomach/Bowel: Unremarkable Vascular/Lymphatic: Aortoiliac atherosclerotic vascular disease. No pathologic adenopathy identified. Other:  No supplemental non-categorized findings. Musculoskeletal: Dextroconvex thoracolumbar scoliosis. Postoperative findings at L5-S1 seen on the coronal images. IMPRESSION: 1. Cholelithiasis with borderline appearance for gallbladder wall thickening. No biliary dilatation or evidence of choledocholithiasis. 2. Numerous scattered cysts in the liver. 3. Very minimal dropout of signal on out of phase images in the liver may reflect minimal hepatic steatosis. 4. Dextroconvex thoracolumbar scoliosis. Postoperative findings at L5-S1. 5.  Aortic Atherosclerosis (ICD10-I70.0). 6. Benign-appearing bilateral renal cysts. Electronically Signed   By: Van Clines M.D.   On: 12/16/2019 10:41   MR ABDOMEN MRCP W WO CONTAST  Result Date: 12/16/2019 CLINICAL DATA:  Right upper quadrant abdominal pain, cholelithiasis, elevated liver enzymes. EXAM: MRI ABDOMEN WITHOUT AND WITH CONTRAST (INCLUDING MRCP) TECHNIQUE: Multiplanar multisequence MR  imaging  of the abdomen was performed both before and after the administration of intravenous contrast. Heavily T2-weighted images of the biliary and pancreatic ducts were obtained, and three-dimensional MRCP images were rendered by post processing. CONTRAST:  2mL GADAVIST GADOBUTROL 1 MMOL/ML IV SOLN COMPARISON:  Abdominal ultrasound 12/15/2019 FINDINGS: Despite efforts by the technologist and patient, motion artifact is present on today's exam and could not be eliminated. This reduces exam sensitivity and specificity. Lower chest: Unremarkable Hepatobiliary: Numerous scattered cysts in the liver of varying sizes. Small filling defects in the gallbladder as on image 20 of series 8 compatible with at least 2 small gallstones. Borderline appearance for gallbladder wall thickening. No biliary dilatation. No observed filling defect in the biliary tree, to the limits allowed by the motion artifact. Very minimal dropout of signal on out of phase images in the liver may reflect minimal hepatic steatosis. No significant abnormal hepatic enhancement. Pancreas:  Unremarkable Spleen:  Unremarkable Adrenals/Urinary Tract: Small bilateral renal cysts. The left kidney upper pole posterior exophytic cyst on image 60 of series 1600 measures 1.4 by 1.3 cm and is mildly complex but does not enhance, compatible with a small Bosniak category 2 cyst. This is benign and does not require further workup. Adrenal glands unremarkable. Stomach/Bowel: Unremarkable Vascular/Lymphatic: Aortoiliac atherosclerotic vascular disease. No pathologic adenopathy identified. Other:  No supplemental non-categorized findings. Musculoskeletal: Dextroconvex thoracolumbar scoliosis. Postoperative findings at L5-S1 seen on the coronal images. IMPRESSION: 1. Cholelithiasis with borderline appearance for gallbladder wall thickening. No biliary dilatation or evidence of choledocholithiasis. 2. Numerous scattered cysts in the liver. 3. Very minimal dropout of signal on  out of phase images in the liver may reflect minimal hepatic steatosis. 4. Dextroconvex thoracolumbar scoliosis. Postoperative findings at L5-S1. 5.  Aortic Atherosclerosis (ICD10-I70.0). 6. Benign-appearing bilateral renal cysts. Electronically Signed   By: Van Clines M.D.   On: 12/16/2019 10:41    Anti-infectives: Anti-infectives (From admission, onward)   None      Assessment/Plan: POD 1 lap chole -can dc home -rx for pain meds in -fu ccs  Rolm Bookbinder 12/18/2019

## 2019-12-18 NOTE — Progress Notes (Addendum)
patient expressing concern about distended abdomen and swelling in feet. Patient states he is passing gas, no pitting edema noted.  OK to proceed with DC per Dr. Dwyane Dee. Dr. Dwyane Dee states to have patient stop norvasc, also states he will remove off of dc paperwork/AVS.   Update:  Dr. Dwyane Dee wants patient to continue norvasc at 5mg  daily

## 2019-12-18 NOTE — Telephone Encounter (Signed)
Called back to [place] and spoke with Sharyn Lull asking her to include a note for patient with his Rx to call and make and appt. She reported he agreed to get 2 weeks of meds only now, and make all his follow up appts before getting more.

## 2019-12-18 NOTE — Telephone Encounter (Signed)
Rx sent. He no showed for his last appt. He needs to make an appt.

## 2019-12-18 NOTE — Addendum Note (Signed)
Addended by: [place] Crane on: 12/18/2019 04:03 PM   Modules accepted: Orders

## 2019-12-18 NOTE — Telephone Encounter (Signed)
Call from [place] at [facility], patient is there wanting to pick up his Belsomra but there is no active Rx for him. Writer reviewed record, he has not been seen here since 8/14, should have been out of his med with his one refill on 10/14 and does not have a future appt scheduled. Will bring his concern to Dr Malachy Moan attention.

## 2019-12-18 NOTE — Progress Notes (Signed)
Patient may shower after 24 hours per Dr. Donne Hazel.

## 2019-12-19 NOTE — Telephone Encounter (Signed)
2 weeks of meds is too little. Looking at my schedule he will likely be seen in January. Please have the front desk contact him today for an appt in January and make sure he has enough refills till next appt. Thanks.

## 2019-12-20 DIAGNOSIS — Z1159 Encounter for screening for other viral diseases: Secondary | ICD-10-CM | POA: Diagnosis not present

## 2019-12-20 DIAGNOSIS — Z20828 Contact with and (suspected) exposure to other viral communicable diseases: Secondary | ICD-10-CM | POA: Diagnosis not present

## 2019-12-23 DIAGNOSIS — K819 Cholecystitis, unspecified: Secondary | ICD-10-CM | POA: Diagnosis not present

## 2019-12-23 DIAGNOSIS — Z9049 Acquired absence of other specified parts of digestive tract: Secondary | ICD-10-CM | POA: Diagnosis not present

## 2019-12-23 DIAGNOSIS — I5189 Other ill-defined heart diseases: Secondary | ICD-10-CM | POA: Diagnosis not present

## 2019-12-24 DIAGNOSIS — Z1159 Encounter for screening for other viral diseases: Secondary | ICD-10-CM | POA: Diagnosis not present

## 2019-12-24 DIAGNOSIS — Z20828 Contact with and (suspected) exposure to other viral communicable diseases: Secondary | ICD-10-CM | POA: Diagnosis not present

## 2020-01-01 DIAGNOSIS — F332 Major depressive disorder, recurrent severe without psychotic features: Secondary | ICD-10-CM | POA: Diagnosis not present

## 2020-01-01 DIAGNOSIS — N400 Enlarged prostate without lower urinary tract symptoms: Secondary | ICD-10-CM | POA: Diagnosis not present

## 2020-01-01 DIAGNOSIS — N5201 Erectile dysfunction due to arterial insufficiency: Secondary | ICD-10-CM | POA: Diagnosis not present

## 2020-01-02 ENCOUNTER — Other Ambulatory Visit: Payer: Self-pay

## 2020-01-02 ENCOUNTER — Encounter: Payer: Self-pay | Admitting: Internal Medicine

## 2020-01-02 ENCOUNTER — Telehealth: Payer: Self-pay | Admitting: Internal Medicine

## 2020-01-02 ENCOUNTER — Ambulatory Visit (INDEPENDENT_AMBULATORY_CARE_PROVIDER_SITE_OTHER): Payer: Medicare Other | Admitting: Internal Medicine

## 2020-01-02 VITALS — BP 158/100 | HR 80 | Ht 67.0 in | Wt 209.0 lb

## 2020-01-02 DIAGNOSIS — I5032 Chronic diastolic (congestive) heart failure: Secondary | ICD-10-CM | POA: Insufficient documentation

## 2020-01-02 DIAGNOSIS — I10 Essential (primary) hypertension: Secondary | ICD-10-CM | POA: Insufficient documentation

## 2020-01-02 DIAGNOSIS — E785 Hyperlipidemia, unspecified: Secondary | ICD-10-CM | POA: Diagnosis not present

## 2020-01-02 MED ORDER — ROSUVASTATIN CALCIUM 10 MG PO TABS: 10.0000 mg | ORAL_TABLET | Freq: Every day | ORAL | 3 refills | Status: DC

## 2020-01-02 MED ORDER — SPIRONOLACTONE 25 MG PO TABS: 25.0000 mg | ORAL_TABLET | Freq: Every day | ORAL | 3 refills | Status: DC

## 2020-01-02 NOTE — Telephone Encounter (Signed)
[facility] is calling requesting clarification on pt's medications. I clarified what medications Dr. Gasper Sells D/C and what medications he started. Pharmacy stated that pt's PCP Dr. Orpah Melter increased pt's medication furosemide 20 mg to 2 tablets daily, but Dr. Gasper Sells started pt on spironolactone as well. Pharmacy wanted to make Dr. Gasper Sells aware of this and needed to know if he still wanted to prescribed spironolactone. Pharmacy tech can be reached at [phone], [place]. Please address

## 2020-01-02 NOTE — Telephone Encounter (Signed)
Pt c/o medication issue:  1. Name of Medication:  spironolactone (ALDACTONE) 25 MG tablet rosuvastatin (CRESTOR) 10 MG tablet  2. How are you currently taking this medication (dosage and times per day)?  n/a  3. Are you having a reaction (difficulty breathing--STAT)?  No   4. What is your medication issue?  Pharmacy calling to find out what medications need to be stopped, patient was unsure.    Connected call to [facility]

## 2020-01-02 NOTE — Progress Notes (Addendum)
Cardiology Office Note:    Date:  01/02/2020   ID:  [name], DOB Jul 13, 1950, MRN [id number]  PCP:  Orpah Melter, MD  [facility] Cardiologist:  No primary care provider on file.  [facility] Electrophysiologist:  None   CC: Discuss diastolic dysfunction Consulted for the evaluation of grade II diastolic dysfunction at the behest of Orpah Melter, MD  History of Present Illness:    [name] is a 69 y.o. male with a hx of HTN, HLD, Stroke NOS, MCD NOS, who presents for evaluation.  Patient notes that e is feeling great.  Has had no chest pain, chest pressure, chest tightness, chest stinging since gallbladder removal. No SOB  Patient exertion notable for minimal exertion due to recent surgery.  No shortness of breath, DOE.  No PND or orthopnea.  No bendopnea. Notes weight gain (notes significant weight gain since December, Notes leg swelling and abdominal swelling.  No syncope or near syncope.  Notes no palpitations or funny heart beats.     Patient reports prior cardiac testing including 11/2019 echo, no stress tests or heart catheterizations.  Noted to have Grade 2 Diastolic dysfunction as part of ED visit for atypical CP; novel troponin testing negative.  Had cholecystectomy.  No Fen-Fen or drug use.  Ambulatory BP on occasion. Lives in an independent living facilty   Past Medical History:  Diagnosis Date  . Depression   . Frequent urination   . GERD (gastroesophageal reflux disease)   . Herpes   . Hyperlipidemia   . Hypertension   . Memory loss   . Obesity   . Stroke ([place])   . Urgency of urination     Past Surgical History:  Procedure Laterality Date  . CHOLECYSTECTOMY N/A 12/17/2019   Procedure: LAPAROSCOPIC CHOLECYSTECTOMY WITH ICG INJECTION;  Surgeon: Rolm Bookbinder, MD;  Location: [place];  Service: General;  Laterality: N/A;  . disckec    . SPINAL FUSION    . VASECTOMY      Current Medications: Current Meds  Medication Sig  .  busPIRone (BUSPAR) 10 MG tablet Take 1 tablet (10 mg total) by mouth 3 (three) times daily.  . candesartan (ATACAND) 32 MG tablet Take 32 mg by mouth daily.  . clopidogrel (PLAVIX) 75 MG tablet Take 1 tablet (75 mg total) by mouth daily.  . furosemide (LASIX) 20 MG tablet Take 1 tablet (20 mg total) by mouth daily.  . metoprolol succinate (TOPROL-XL) 25 MG 24 hr tablet Take 25 mg by mouth daily.   . ondansetron (ZOFRAN) 4 MG tablet Take 1 tablet (4 mg total) by mouth every 8 (eight) hours as needed for nausea.  Marland Kitchen PARoxetine (PAXIL-CR) 37.5 MG 24 hr tablet TAKE 1 TABLET (37.5 MG TOTAL) BY MOUTH AT BEDTIME.  . primidone (MYSOLINE) 250 MG tablet TAKE 1 TABLET BY MOUTH TWICE A DAY (Patient taking differently: Take 250 mg by mouth 2 (two) times daily.)  . QUEtiapine (SEROQUEL) 25 MG tablet Take 3 tablets at bedtime (75 mg)  . Suvorexant (BELSOMRA) 10 MG TABS Take 10 mg by mouth at bedtime.  . traMADol (ULTRAM) 50 MG tablet Take 2 tablets (100 mg total) by mouth every 6 (six) hours as needed.  . valACYclovir (VALTREX) 1000 MG tablet Take 1,000 mg by mouth as needed (herpes).  . [DISCONTINUED] amLODipine (NORVASC) 10 MG tablet Take 0.5 tablets (5 mg total) by mouth daily. (Patient taking differently: Take 10 mg by mouth daily.)  . [DISCONTINUED] lovastatin (MEVACOR) 20 MG  tablet Take 20 mg by mouth daily.      Allergies:   Aspirin, Lisinopril, Lithium, Oxycodone, Oxycodone-acetaminophen, and Pork-derived products   Social History   Socioeconomic History  . Marital status: Legally Separated    Spouse name: Not on file  . Number of children: 1  . Years of education: Masters  . Highest education level: Not on file  Occupational History  . Occupation: retired    Fish farm manager: RETIRED  Tobacco Use  . Smoking status: Former Research scientist (life sciences)  . Smokeless tobacco: Never Used  Substance and Sexual Activity  . Alcohol use: No    Alcohol/week: 1.0 standard drink    Types: 1 Cans of beer per week    Comment:  socially  . Drug use: No  . Sexual activity: Never  Other Topics Concern  . Not on file  Social History Narrative   Patient is separated.   Patient lives alone and has one child.   Patient is retired.   Patient is right-handed.   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   Patient does not drink any caffeine.   Social Determinants of Health   Financial Resource Strain: Not on file  Food Insecurity: Not on file  Transportation Needs: Not on file  Physical Activity: Not on file  Stress: Not on file  Social Connections: Not on file     Family History: The patient's family history includes Cancer in his maternal aunt and maternal aunt; Cancer - Ovarian in his maternal grandmother; Diabetes Mellitus I in his paternal grandmother; Hypertension in his brother, father, mother, paternal grandfather, and sister.  History of coronary artery disease notable for no members. History of heart failure notable for no members. No history of cardiomyopathies including hypertrophic cardiomyopathy, left ventricular non-compaction, or arrhythmogenic right ventricular cardiomyopathy. History of arrhythmia notable for no members. Denies family history of sudden cardiac death including drowning, car accidents, or unexplained deaths in the family. No history of bicuspid aortic valve or aortic aneurysm or dissection.   ROS:   Please see the history of present illness.    Notes erectile dysfunction un-amenable to PD5i  All other systems reviewed and are negative.  EKGs/Labs/Other Studies Reviewed:    The following studies were reviewed today:  EKG:   01/24/08 SR 82 WNL 12/16/19:  SR rate 65, iRBBB and LAFB  Transthoracic Echocardiogram: Date: 01/02/20 Results: Severe LA dilation; IVC WNL E/E' 20 RVSP 22 1. Left ventricular ejection fraction, by estimation, is 60 to 65%. The  left ventricle has normal function. The left ventricle has no regional  wall motion abnormalities. There is mild concentric  left ventricular  hypertrophy. Left ventricular diastolic  parameters are consistent with Grade II diastolic dysfunction  (pseudonormalization). Elevated left atrial pressure.  2. Right ventricular systolic function is normal. The right ventricular  size is mildly enlarged. There is normal pulmonary artery systolic  pressure.  3. Left atrial size was severely dilated.  4. Right atrial size was moderately dilated.  5. The mitral valve is normal in structure. No evidence of mitral valve  regurgitation. No evidence of mitral stenosis.  6. The aortic valve is normal in structure. Aortic valve regurgitation is  not visualized. Mild aortic valve sclerosis is present, with no evidence  of aortic valve stenosis.  7. The inferior vena cava is normal in size with greater than 50%  respiratory variability, suggesting right atrial pressure of 3 mmHg.  Recent Labs: 12/15/2019: B Natriuretic Peptide 19.2; TSH 2.605 12/16/2019: Hemoglobin 15.5; Platelets 287  12/18/2019: ALT 73; BUN 14; Creatinine, Ser 1.09; Magnesium 2.0; Potassium 4.2; Sodium 136  Recent Lipid Panel    Component Value Date/Time   CHOL 176 12/17/2019 0241   CHOL 150 05/29/2014 1503   TRIG 181 (H) 12/17/2019 0241   HDL 31 (L) 12/17/2019 0241   HDL 30 (L) 05/29/2014 1503   CHOLHDL 5.7 12/17/2019 0241   VLDL 36 12/17/2019 0241   LDLCALC 109 (H) 12/17/2019 0241   LDLCALC 63 05/29/2014 1503   Risk Assessment/Calculations:     .The 10-year ASCVD risk score Mikey Bussing DC Brooke Bonito., et al., 2013) is: 27%   Values used to calculate the score:     Age: 32 years     Sex: Male     Is Non-Hispanic African American: Yes     Diabetic: No     Tobacco smoker: No     Systolic Blood Pressure: 259 mmHg     Is BP treated: Yes     HDL Cholesterol: 31 mg/dL     Total Cholesterol: 176 mg/dL   Physical Exam:    VS:  BP (!) 158/100   Pulse 80   Ht 5\' 7"  (1.702 m)   Wt 209 lb (94.8 kg)   SpO2 98%   BMI 32.73 kg/m     Wt Readings from Last  3 Encounters:  01/02/20 209 lb (94.8 kg)  12/18/19 207 lb 4.8 oz (94 kg)  03/25/19 170 lb 9.6 oz (77.4 kg)    GEN:  Well nourished, well developed in no acute distress HEENT: Normal NECK: No JVD; No carotid bruits LYMPHATICS: No lymphadenopathy CARDIAC: RRR, no murmurs, rubs, gallops RESPIRATORY:  Clear to auscultation without rales, wheezing or rhonchi  ABDOMEN: Soft, non-tender, non-distended MUSCULOSKELETAL:  Bilateral non-pitting edema; No deformity  SKIN: Warm and dry NEUROLOGIC:  Alert and oriented x 3 PSYCHIATRIC:  Normal affect   ASSESSMENT:    1. Hyperlipidemia, unspecified hyperlipidemia type   2. Hypertension, unspecified type    PLAN:    In order of problems listed above:   Heart Failure preserved Ejection Fraction HTN LE Swelling H2PEF Score 73% suggestive of HFpEF - NYHA class I, Stage II, slightly hypervolemic, etiology from HTN - Diuretic regimen: Lasix 20 mg PO daily - daily weights, and fluid restriction of < 2 L  - Bmp Mg in 7/10 days  - Metoprolol succinate 25 mg PO daily - stopping amlodipine - aldactone 25 mg Po daily  Erectile Dysfunction HLD Hyperlipidemia -LDL goal less than 100 -increasing to rosuvastatin 10 mg PO daily -Recheck lipid profile LFTs in 3 months - gave education on dietary changes  Preoperative Risk Assessment-> L rotator cuff surgery Dr. Tamera Punt - The Revised Cardiac Risk Index = 1 CVA  1=0.9%: low risk of perioperative myocardial infarction, pulmonary edema, ventricular fibrillation, cardiac arrest, or complete heart block.  - DASI score of 32.2 associated with 6.7 functional mets - No further cardiac testing is recommended prior to surgery.  - Tolerated recent surgery without issues - The patient may proceed to surgery at acceptable risk.   - Our service is available as needed in the peri-operative period.      Presently CP free after surgery; if re-occurs we will re-evaluate  One month follow up unless new symptoms  or abnormal test results warranting change in plan  Would be reasonable for  APP Follow up   Medication Adjustments/Labs and Tests Ordered: Current medicines are reviewed at length with the patient today.  Concerns regarding medicines are outlined  above.  Orders Placed This Encounter  Procedures  . Basic metabolic panel  . Magnesium   Meds ordered this encounter  Medications  . spironolactone (ALDACTONE) 25 MG tablet    Sig: Take 1 tablet (25 mg total) by mouth daily.    Dispense:  90 tablet    Refill:  3  . rosuvastatin (CRESTOR) 10 MG tablet    Sig: Take 1 tablet (10 mg total) by mouth daily.    Dispense:  90 tablet    Refill:  3    Patient Instructions  Medication Instructions:  1) STOP AMLODIPINE 2) STOP LOVASTATIN 3) START SPIRONOLACTONE 25 mg daily 4) START ROSUVASTATIN 10 mg daily *If you need a refill on your cardiac medications before your next appointment, please call your pharmacy*  Lab Work: Your provider recommends that you return for lab work in 7-10 days. Please check your blood pressure at home, 2 hours after you have taken your blood pressure medications. Keep a log and give [place] your readings when we call with your lab results.  Follow-Up: At [facility], you and your health needs are our priority.  As part of our continuing mission to provide you with exceptional heart care, we have created designated Provider Care Teams.  These Care Teams include your primary Cardiologist (physician) and Advanced Practice Providers (APPs -  Physician Assistants and Nurse Practitioners) who all work together to provide you with the care you need, when you need it. Your next appointment:   4 week(s) The format for your next appointment:   In Person Provider:   Rudean Haskell, MD     Signed, Werner Lean, MD  01/02/2020 10:58 AM    Omar with team and pharmacy:  Patient's PCP increased lasix to 40 mg PO  daily.  We will move up lab appointment but continue therapeutic management.  Discussed with nursing team.

## 2020-01-02 NOTE — Telephone Encounter (Signed)
Spoke to Pharmacy. They want to make sure Dr. Gasper Sells is aware that his PCP increased his lasix to 40 mg daily yesterday.   Notified Dr. Gasper Sells about Lasix dosing. He is okay with still added the spironolactone but will bring the patient in earlier for labs. Canceled lab appointment on 12/27 and moved to 12/22.   Patient verbalized understanding.

## 2020-01-02 NOTE — Patient Instructions (Signed)
Medication Instructions:  1) STOP AMLODIPINE 2) STOP LOVASTATIN 3) START SPIRONOLACTONE 25 mg daily 4) START ROSUVASTATIN 10 mg daily *If you need a refill on your cardiac medications before your next appointment, please call your pharmacy*  Lab Work: Your provider recommends that you return for lab work in 7-10 days. Please check your blood pressure at home, 2 hours after you have taken your blood pressure medications. Keep a log and give [place] your readings when we call with your lab results.  Follow-Up: At [facility], you and your health needs are our priority.  As part of our continuing mission to provide you with exceptional heart care, we have created designated Provider Care Teams.  These Care Teams include your primary Cardiologist (physician) and Advanced Practice Providers (APPs -  Physician Assistants and Nurse Practitioners) who all work together to provide you with the care you need, when you need it. Your next appointment:   4 week(s) The format for your next appointment:   In Person Provider:   Rudean Haskell, MD

## 2020-01-03 ENCOUNTER — Ambulatory Visit (HOSPITAL_COMMUNITY): Payer: Medicare Other | Admitting: Psychiatry

## 2020-01-07 ENCOUNTER — Encounter (HOSPITAL_COMMUNITY): Payer: Self-pay | Admitting: Psychiatry

## 2020-01-07 ENCOUNTER — Ambulatory Visit (INDEPENDENT_AMBULATORY_CARE_PROVIDER_SITE_OTHER): Payer: Medicare Other | Admitting: Psychiatry

## 2020-01-07 ENCOUNTER — Other Ambulatory Visit: Payer: Self-pay

## 2020-01-07 VITALS — BP 155/87 | HR 66 | Ht 67.0 in | Wt 206.0 lb

## 2020-01-07 DIAGNOSIS — I5189 Other ill-defined heart diseases: Secondary | ICD-10-CM | POA: Diagnosis not present

## 2020-01-07 DIAGNOSIS — F3342 Major depressive disorder, recurrent, in full remission: Secondary | ICD-10-CM | POA: Diagnosis not present

## 2020-01-07 DIAGNOSIS — F419 Anxiety disorder, unspecified: Secondary | ICD-10-CM | POA: Diagnosis not present

## 2020-01-07 DIAGNOSIS — M67912 Unspecified disorder of synovium and tendon, left shoulder: Secondary | ICD-10-CM | POA: Diagnosis not present

## 2020-01-07 DIAGNOSIS — F3341 Major depressive disorder, recurrent, in partial remission: Secondary | ICD-10-CM | POA: Diagnosis not present

## 2020-01-07 MED ORDER — BELSOMRA 10 MG PO TABS: 10.0000 mg | ORAL_TABLET | Freq: Every day | ORAL | 1 refills | Status: DC

## 2020-01-07 MED ORDER — QUETIAPINE FUMARATE 25 MG PO TABS: ORAL_TABLET | ORAL | 1 refills | Status: DC

## 2020-01-07 MED ORDER — PAROXETINE HCL ER 37.5 MG PO TB24: 37.5000 mg | ORAL_TABLET | Freq: Every day | ORAL | 1 refills | Status: DC

## 2020-01-07 MED ORDER — BUSPIRONE HCL 10 MG PO TABS: 10.0000 mg | ORAL_TABLET | Freq: Three times a day (TID) | ORAL | 1 refills | Status: DC

## 2020-01-07 NOTE — Progress Notes (Signed)
[facility] MD OP Progress Note    01/07/2020 4:01 PM [name]  MRN:  [id number]  Chief Complaint: " I am so happy to meet you."   HPI: Patient was seen by the writer for the first time in the office.  Prior to this Probation officer and the patient were meeting virtually.  Patient stated that he has been doing great.  He still living the same group home.  He stated that things are going well there however he has been dealing with a lot of health issues. He informed that he was recently hospitalized for chest pain however during his hospital stay they found out that he had gallstones and underwent laparoscopic cholecystectomy on 12/17/19. TTE revealed grade 2 diastolic dysfunction, normal LVEF.  He has been following up with his cardiologist since discharge and is being managed for chronic heart failure with preserved ejection fraction.  He is supposed to undergo another surgical procedure on his left shoulder due to the chronic pain he has been dealing with.  He stated that once daily with the shoulder issue he may need to undergo cardiac issues in the future.  He spoke about how his relationship with his girlfriend and the facility is going well.  He informed that he went to visit her family for Thanksgiving but is not sure if he will be going to visit her family with her for Christmas. He stated that despite being off a different race her girlfriend's family was accepting of him and treated him well.  He said he did not feel any discomfort when he was with them. However for Christmas it may be a little different and therefore he is not going with her.  He informed that his daughter has been in touch with him regularly.  She came by earlier this morning to visit him and gave him his present for Christmas.  He informed that she will be going to [place] for the Christmas holidays and therefore he will not be able to see her on Christmas.  He stated that there is a good chance that he will have to eat alone  for the holiday due to everyone close to him being busy. He stated that sometimes he feels a little irritated about the fact that his ex-wife knows everything about him and what is going on with him as his daughter shares everything with her however he does not know anything about his ex-wife because his daughter does not tell him anything about her mother.  Writer provided him with reassurance and told him to focus on the things that give him joy.  That he is no longer taking Ingrezza and he is taking the remaining regimen as prescribed.  He finds Belsomra to be helpful for his sleep and he also takes buspirone 3 times a day which helps with anxiety  Visit Diagnosis:    ICD-10-CM   1. Recurrent major depressive disorder, in full remission ([place])  F33.42   2. Anxiety  F41.9     Past Psychiatric History: MDD, TD, anxiety  Past Medical History:  Past Medical History:  Diagnosis Date  . Depression   . Frequent urination   . GERD (gastroesophageal reflux disease)   . Herpes   . Hyperlipidemia   . Hypertension   . Memory loss   . Obesity   . Stroke ([place])   . Urgency of urination     Past Surgical History:  Procedure Laterality Date  . CHOLECYSTECTOMY N/A 12/17/2019   Procedure: LAPAROSCOPIC  CHOLECYSTECTOMY WITH ICG INJECTION;  Surgeon: Rolm Bookbinder, MD;  Location: [place];  Service: General;  Laterality: N/A;  . disckec    . SPINAL FUSION    . VASECTOMY        Family History:  Family History  Problem Relation Age of Onset  . Hypertension Mother   . Hypertension Father   . Hypertension Sister   . Hypertension Brother   . Cancer Maternal Aunt   . Cancer - Ovarian Maternal Grandmother   . Diabetes Mellitus I Paternal Grandmother   . Hypertension Paternal Grandfather   . Cancer Maternal Aunt     Social History:  Social History   Socioeconomic History  . Marital status: Legally Separated    Spouse name: Not on file  . Number of children: 1  . Years of education:  Masters  . Highest education level: Not on file  Occupational History  . Occupation: retired    Fish farm manager: RETIRED  Tobacco Use  . Smoking status: Former Research scientist (life sciences)  . Smokeless tobacco: Never Used  Substance and Sexual Activity  . Alcohol use: No    Alcohol/week: 1.0 standard drink    Types: 1 Cans of beer per week    Comment: socially  . Drug use: No  . Sexual activity: Never  Other Topics Concern  . Not on file  Social History Narrative   Patient is separated.   Patient lives alone and has one child.   Patient is retired.   Patient is right-handed.   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   Patient does not drink any caffeine.   Social Determinants of Health   Financial Resource Strain: Not on file  Food Insecurity: Not on file  Transportation Needs: Not on file  Physical Activity: Not on file  Stress: Not on file  Social Connections: Not on file    Allergies:  Allergies  Allergen Reactions  . Aspirin Other (See Comments)    Dizziness, nausea and vomiting Balance issues  . Lisinopril Other (See Comments)    Critical Cough  . Lithium Other (See Comments)    Critical Per pt reaction unknown  . Oxycodone Nausea And Vomiting  . Oxycodone-Acetaminophen Nausea And Vomiting    Also causes dizziness  . Pork-Derived Products Other (See Comments)    Unspecified reaction    Metabolic Disorder Labs: Lab Results  Component Value Date   HGBA1C 5.6 12/17/2019   MPG 114.02 12/17/2019   MPG 120 (H) 12/20/2009   No results found for: PROLACTIN Lab Results  Component Value Date   CHOL 176 12/17/2019   TRIG 181 (H) 12/17/2019   HDL 31 (L) 12/17/2019   CHOLHDL 5.7 12/17/2019   VLDL 36 12/17/2019   LDLCALC 109 (H) 12/17/2019   LDLCALC 63 05/29/2014   Lab Results  Component Value Date   TSH 2.605 12/15/2019   TSH 2.850 10/16/2013    Therapeutic Level Labs: No results found for: LITHIUM No results found for: VALPROATE No components found for:  CBMZ  Current  Medications: Current Outpatient Medications  Medication Sig Dispense Refill  . busPIRone (BUSPAR) 10 MG tablet Take 1 tablet (10 mg total) by mouth 3 (three) times daily. 90 tablet 1  . candesartan (ATACAND) 32 MG tablet Take 32 mg by mouth daily.    . clopidogrel (PLAVIX) 75 MG tablet Take 1 tablet (75 mg total) by mouth daily. 30 tablet 11  . furosemide (LASIX) 20 MG tablet Take 1 tablet (20 mg total) by mouth daily. [place]  tablet 0  . metoprolol succinate (TOPROL-XL) 25 MG 24 hr tablet Take 25 mg by mouth daily.     . ondansetron (ZOFRAN) 4 MG tablet Take 1 tablet (4 mg total) by mouth every 8 (eight) hours as needed for nausea. 8 tablet 5  . PARoxetine (PAXIL-CR) 37.5 MG 24 hr tablet TAKE 1 TABLET (37.5 MG TOTAL) BY MOUTH AT BEDTIME. 30 tablet 1  . primidone (MYSOLINE) 250 MG tablet TAKE 1 TABLET BY MOUTH TWICE A DAY (Patient taking differently: Take 250 mg by mouth 2 (two) times daily.) 60 tablet 12  . QUEtiapine (SEROQUEL) 25 MG tablet Take 3 tablets at bedtime (75 mg) 90 tablet 1  . rosuvastatin (CRESTOR) 10 MG tablet Take 1 tablet (10 mg total) by mouth daily. 90 tablet 3  . spironolactone (ALDACTONE) 25 MG tablet Take 1 tablet (25 mg total) by mouth daily. 90 tablet 3  . Suvorexant (BELSOMRA) 10 MG TABS Take 10 mg by mouth at bedtime. 30 tablet 1  . valACYclovir (VALTREX) 1000 MG tablet Take 1,000 mg by mouth as needed (herpes).     No current facility-administered medications for this visit.     Musculoskeletal: Strength & Muscle Tone: unable to assess due to telemed visit Gait & Station: unable to assess due to telemed visit Patient leans: unable to assess due to telemed visit  Psychiatric Specialty Exam: Review of Systems  Blood pressure (!) 155/87, pulse 66, height 5\' 7"  (1.702 m), weight 206 lb (93.4 kg), SpO2 100 %.Body mass index is 32.26 kg/m.  General Appearance: Fairly Groomed  Eye Contact:  Good  Speech:  Clear and Coherent and Normal Rate  Volume:  Normal  Mood:   Euthymic  Affect: congruent  Thought Process:  Goal Directed and Descriptions of Associations: Intact  Orientation:  Full (Time, Place, and Person)  Thought Content: Logical   Suicidal Thoughts:  No  Homicidal Thoughts:  No  Memory:  Recent;   Good  Judgement:  Fair  Insight:  Fair  Psychomotor Activity: Mild Orotardive dyskinesia noted  Concentration:  Concentration: Good and Attention Span: Good  Recall:  Good  Fund of Knowledge: Good  Language: Good  Akathisia:  Negative  Handed:  Right  AIMS (if indicated): Orotardive dyskinesia   Assets:  Communication Skills Desire for Improvement Financial Resources/Insurance Housing  ADL's:  Intact  Cognition: Impaired,  Mild  Sleep:  Good, improved   Screenings: AIMS   Flowsheet Row Admission (Discharged) from OP Visit from 02/02/2019 in [facility] Total Score 12    AUDIT   [place] Admission (Discharged) from OP Visit from 02/02/2019 in [place]  Alcohol Use Disorder Identification Test Final Score (AUDIT) 0    Mini-Mental   Flowsheet Row Office Visit from 03/25/2019 in [facility] Office Visit from 09/12/2018 in [facility] Office Visit from 03/12/2018 in [facility] Office Visit from 09/26/2017 in [facility] Office Visit from 06/14/2017 in [facility]  Total Score (max 30 points ) 24 24 28 28 27        Assessment and Plan: Patient appears to be doing well on his current regimen we will keep things the way they are.  1. Recurrent major depressive disorder, in full remission ([place])  - PARoxetine (PAXIL-CR) 37.5 MG 24 hr tablet; Take 1 tablet (37.5 mg total) by mouth at bedtime.  Dispense: 30 tablet; Refill: 1 - QUEtiapine (SEROQUEL) 25 MG tablet; Take 3 tablets at bedtime (  75 mg)  Dispense: 90 tablet; Refill: 1 - Suvorexant (BELSOMRA) 10 MG TABS; Take 10 mg by mouth at bedtime.   Dispense: 30 tablet; Refill: 1  2. Anxiety  - busPIRone (BUSPAR) 10 MG tablet; Take 1 tablet (10 mg total) by mouth 3 (three) times daily.  Dispense: 90 tablet; Refill: 1    Continue same regimen. Follow-up in 2 months.  [place] Crane, MD 01/07/2020, 4:01 PM

## 2020-01-08 ENCOUNTER — Telehealth: Payer: Self-pay | Admitting: Adult Health

## 2020-01-08 ENCOUNTER — Other Ambulatory Visit: Payer: Medicare Other

## 2020-01-09 ENCOUNTER — Other Ambulatory Visit: Payer: Self-pay

## 2020-01-09 ENCOUNTER — Encounter: Payer: Self-pay | Admitting: Adult Health

## 2020-01-09 ENCOUNTER — Ambulatory Visit (INDEPENDENT_AMBULATORY_CARE_PROVIDER_SITE_OTHER): Payer: Medicare Other | Admitting: Adult Health

## 2020-01-09 VITALS — BP 142/92 | HR 61 | Ht 67.0 in | Wt 205.0 lb

## 2020-01-09 DIAGNOSIS — G2401 Drug induced subacute dyskinesia: Secondary | ICD-10-CM | POA: Diagnosis not present

## 2020-01-09 DIAGNOSIS — F3342 Major depressive disorder, recurrent, in full remission: Secondary | ICD-10-CM

## 2020-01-09 DIAGNOSIS — Z8673 Personal history of transient ischemic attack (TIA), and cerebral infarction without residual deficits: Secondary | ICD-10-CM

## 2020-01-09 DIAGNOSIS — G3184 Mild cognitive impairment, so stated: Secondary | ICD-10-CM | POA: Diagnosis not present

## 2020-01-09 NOTE — Progress Notes (Signed)
I agree with the above plan 

## 2020-01-09 NOTE — Patient Instructions (Signed)
Your Plan:  Continue primidone 250 mg twice daily for tremor management  Continue to follow closely with psychiatry for depression and anxiety  Your memory testing today showed improvement compared to prior visit and currently is within normal limits    Follow-up in 6 months or call earlier if needed     Thank you for coming to see [place] at [facility]. I hope we have been able to provide you high quality care today.  You may receive a patient satisfaction survey over the next few weeks. We would appreciate your feedback and comments so that we may continue to improve ourselves and the health of our patients.

## 2020-01-09 NOTE — Progress Notes (Signed)
Of for of  [facility]  PATIENT: [name] DOB: 12-23-1950   REASON FOR VISIT: Follow-up for  tremor, memory loss, history of stroke HISTORY FROM: Patient    Chief Complaint  Patient presents with  . Follow-up    Rm 9, alone, reports he is doing well, tremors have stopped       HISTORY OF PRESENT ILLNESS  Today, 01/09/2020, Mr. Payant returns for a prolonged 95-month follow-up regarding tardive dyskinesia and cognitive impairment after prior visit in 03/2019.  He is unaccompanied at today's visit.  He has been doing remarkably well since prior visit.  He has remained on primidone 250 mg twice daily and states tremors have completely stopped.  He also continues to follow closely with psychiatry for depression and anxiety management which has greatly improved and stable on Paxil, Seroquel, belsomra and BuSpar which has been very stable.  Cognition stable without worsening.  MMSE today 27/30 (prior 24/30).  He continues to live in independent living facility and has been doing well there.  He has no concerns at this time.   History provided for reference purposes only Update 03/25/2019 JM: Mr. Derstine is a 69 year old male who is being seen today, 03/25/2019, for follow-up regarding tremors and cognitive impairment.  Since prior visit, he recently moved into an independent living facility which she has had difficulty adjusting to.  He did have hospitalization in 01/2019 due to suicidal ideation with patient believing suicidal thoughts were due to [facility] which were initiated by psychiatry to help with tardive dyskinesia.  Restarted on Ingrezza as well as initiating BuSpar and switching to Paxil CR by psychiatry for MDD, anxiety and tardive dyskinesia.  Per review of psychiatry notes, patient stopped taking Ingrezza due to improvement of tremors.  He states that he has noticed worsening of his tremors recently and is considering restarting Ingrezza.  He does continue on  primidone 250 mg twice daily.  He does report great improvement of anxiety with initiation of BuSpar.  He has scheduled follow-up visit with psychiatry at the end of this month.  In regards to cognitive impairment, daughter voiced concerns at prior visits as well as previously evaluated but unable to further evaluate at recent visits due to length of visit for tardive dyskinesia concerns.  MMSE today 24/30.  He believes memory has been stable.  Daughter did not accompany patient at today's visit.  Continues on Plavix and lovastatin for secondary stroke prevention without side effects.  He also voices concerns regarding sweating behind his legs present upon awakening and subside around 6:30pm as well as consistent cold feeling.  No further concerns at this time.  Update 12/20/2018: Mr Rizer is a 69 year old male who is being seen today for management regarding ongoing tremors secondary to tardiative dyskinsesia as well as ongoing cognitive impairment.  There are prior visits, discussion regarding use of primidone occasionally and was not taking daily.  Prior visit, primidone increased to twice daily usage of 250 mg per dose.  He recently had visit with psychiatrist who recommended increasing Ingrezza and initiating Seroquel to help with ongoing tremors secondary to tardive dyskinesia.  He was also initiated on Seroquel with continuing on Paxil and discontinuing mirtazapine due to ongoing depression management.  Per daughter, recent medication changes by psychiatry which is initiated on Sunday as he receives medications through pill packs.  Daughter has noticed slight improvement since initiating medications.  He continues to have memory and cognitive difficulties and daughter believes slowly declining.  He  continues to drive despite prior recommendations and daughter concerned regarding ongoing safety regarding continue driving and living independently. He continues to have difficulty with delayed/difficulty  processing information, not remembering certain dates such as doctor appointments and unable to remember recent conversations.  Daughter brings in multiple old medication bottles showing patient not adequately taking medications appropriately in the past likely due to memory/cognitive impairment hence the reason for pill packs at this time.  He did undergo neuropsych evaluation in 2018, felt as though variable effort to engage in cognitive testing virtual abilities thought to be at least level reported and deficits cannot be assumed to be genuine.  Results were globally below expectation based on estimated premorbid abilities and resulting consistent with prior cognitive screening during office visit with Dr. [name] with MMSE 28/30 suggesting that he is not experiencing gross or severe neurocognitive dysfunction and potentially psychiatric disorder potentially playing a role cognitive complaints.  Update 10/03/2018: Mr. Escoto is being seen today per patient request due to worsening of tremors.  Patient initially presented alone he states he increase primidone dose to 250 mg twice daily which he took for 1.5 weeks but discontinued 2 days ago as he did not have any improvement.  He also endorses not currently being followed by psychiatry and is in the process of looking for a new psychiatrist.  He denies any changes in mood or increased depression.  He does endorse ongoing compliance with psychiatric medications.  Daughter then presented to visit who was unaware of patient discontinuing primidone and after further investigating, he was actually only taking primidone for 4 days prior to discontinuing.  Daughter does endorse patient stating worsening depression with decreased appetite, decreased energy and history of worsening depression with season changes.  He does have follow-up scheduled with his current psychiatrist on 10/15/2018 but is currently in the process of finding a new psychiatrist as he will not be  continuing to accept Medicare insurance.  Daughter is currently in the process of finding a retirement/independent/assisted living facility for patient as recommended by his psychiatrist due to slowly worsening of memory/cognition.  He currently lives alone.  Daughter is in the process of changing pharmacies to obtain pill packs to assist patient with medication compliance.   Update 09/12/2018: Mr. Moya is a 69 year old male with prior history of stroke 2011, HTN, HLD, MCI and tremors who is being seen today for 74-month follow-up unaccompanied.  He has been being followed in this office since 2015 with Dr. [name] for ongoing tardive dyskinesia and cognitive impairment.  He continues on primidone 250 mg daily with only mild tremors that do not interfere with daily activity.  He is questioning potential need of increasing dose due to residual tremors.  He endorses occasional increased anxiety for which she will take Valium for but overall anxiety and depression stable with ongoing follow-up by psychiatry.  MMSE slightly worse today than prior visit at 24/30 (prior 28/30) but subjectively feels as though his memory has been stable.  As daughter was unable to accompany patient at today's visit, she called yesterday to inform us that his current psychiatrist recommended patient to be placed in a retirement facility due to mental state.  Patient did attempt to call his daughter at beginning of visit but unable to reach.  He does feel as though he has recently been feeling "brain fog" but is able to function without difficulty.  Unable to determine if recent medication changes have been made through epic by psychiatry but patient denies recent  changes.  Denies recurrent stroke/TIA symptoms.  Continues on clopidogrel and omega-3 without side effects for secondary stroke prevention.  No further concerns at this time.     REVIEW OF SYSTEMS: Full 14 system review of systems performed and notable only for those listed,  all others are neg: No complaints and all other systems negative      ALLERGIES: Allergies  Allergen Reactions  . Aspirin Other (See Comments)    Dizziness, nausea and vomiting Balance issues  . Lisinopril Other (See Comments)    Critical Cough  . Lithium Other (See Comments)    Critical Per pt reaction unknown  . Oxycodone Nausea And Vomiting  . Oxycodone-Acetaminophen Nausea And Vomiting    Also causes dizziness  . Pork-Derived Products Other (See Comments)    Unspecified reaction    HOME MEDICATIONS: Outpatient Medications Prior to Visit  Medication Sig Dispense Refill  . busPIRone (BUSPAR) 10 MG tablet Take 1 tablet (10 mg total) by mouth 3 (three) times daily. 90 tablet 1  . candesartan (ATACAND) 32 MG tablet Take 32 mg by mouth daily.    . clopidogrel (PLAVIX) 75 MG tablet Take 1 tablet (75 mg total) by mouth daily. 30 tablet 11  . furosemide (LASIX) 20 MG tablet Take 1 tablet (20 mg total) by mouth daily. 30 tablet 0  . metoprolol succinate (TOPROL-XL) 25 MG 24 hr tablet Take 25 mg by mouth daily.     . ondansetron (ZOFRAN) 4 MG tablet Take 1 tablet (4 mg total) by mouth every 8 (eight) hours as needed for nausea. 8 tablet 5  . PARoxetine (PAXIL-CR) 37.5 MG 24 hr tablet Take 1 tablet (37.5 mg total) by mouth at bedtime. 30 tablet 1  . primidone (MYSOLINE) 250 MG tablet TAKE 1 TABLET BY MOUTH TWICE A DAY (Patient taking differently: Take 250 mg by mouth 2 (two) times daily.) 60 tablet 12  . QUEtiapine (SEROQUEL) 25 MG tablet Take 3 tablets at bedtime (75 mg) 90 tablet 1  . spironolactone (ALDACTONE) 25 MG tablet Take 1 tablet (25 mg total) by mouth daily. 90 tablet 3  . Suvorexant (BELSOMRA) 10 MG TABS Take 10 mg by mouth at bedtime. 30 tablet 1  . valACYclovir (VALTREX) 1000 MG tablet Take 1,000 mg by mouth as needed (herpes).    . rosuvastatin (CRESTOR) 10 MG tablet Take 1 tablet (10 mg total) by mouth daily. 90 tablet 3   No facility-administered medications prior to  visit.    PAST MEDICAL HISTORY: Past Medical History:  Diagnosis Date  . Depression   . Frequent urination   . GERD (gastroesophageal reflux disease)   . Herpes   . Hyperlipidemia   . Hypertension   . Memory loss   . Obesity   . Stroke ([place])   . Urgency of urination     PAST SURGICAL HISTORY: Past Surgical History:  Procedure Laterality Date  . CHOLECYSTECTOMY N/A 12/17/2019   Procedure: LAPAROSCOPIC CHOLECYSTECTOMY WITH ICG INJECTION;  Surgeon: Rolm Bookbinder, MD;  Location: [name];  Service: General;  Laterality: N/A;  . disckec    . SPINAL FUSION    . VASECTOMY      FAMILY HISTORY: Family History  Problem Relation Age of Onset  . Hypertension Mother   . Hypertension Father   . Hypertension Sister   . Hypertension Brother   . Cancer Maternal Aunt   . Cancer - Ovarian Maternal Grandmother   . Diabetes Mellitus I Paternal Grandmother   . Hypertension Paternal Grandfather   .  Cancer Maternal Aunt     SOCIAL HISTORY: Social History   Socioeconomic History  . Marital status: Legally Separated    Spouse name: Not on file  . Number of children: 1  . Years of education: Masters  . Highest education level: Not on file  Occupational History  . Occupation: retired    Fish farm manager: RETIRED  Tobacco Use  . Smoking status: Former Research scientist (life sciences)  . Smokeless tobacco: Never Used  Substance and Sexual Activity  . Alcohol use: No    Alcohol/week: 1.0 standard drink    Types: 1 Cans of beer per week    Comment: socially  . Drug use: No  . Sexual activity: Never  Other Topics Concern  . Not on file  Social History Narrative   Patient is separated.   Patient lives alone and has one child.   Patient is retired.   Patient is right-handed.   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   Patient does not drink any caffeine.   Social Determinants of Health   Financial Resource Strain: Not on file  Food Insecurity: Not on file  Transportation Needs: Not on file  Physical  Activity: Not on file  Stress: Not on file  Social Connections: Not on file  Intimate Partner Violence: Not on file     PHYSICAL EXAM  Vitals:   01/09/20 0723  BP: (!) 142/92  Pulse: 61  Weight: 205 lb (93 kg)  Height: 5\' 7"  (1.702 m)   Body mass index is 32.11 kg/m. Generalized: Well developed very pleasant middle aged african [place] male, in no acute distress   Head: normocephalic and atraumatic,   Neck: Supple, no carotid bruits  Cardiac: Regular rate rhythm, no murmur  Musculoskeletal: No deformity   Neurological examination  Mentation: Alert oriented to time, place, history taking. Follows all commands speech and language fluent. Knowledge of information appropriate today MMSE - Mini Mental State Exam 01/09/2020 03/25/2019 09/12/2018  Not completed: - (No Data) -  Orientation to time 4 3 3   Orientation to Place 5 4 4   Registration 3 3 3   Attention/ Calculation 3 2 2   Recall 3 3 3   Language- name 2 objects 2 2 2   Language- repeat 1 1 1   Language- follow 3 step command 3 3 3   Language- follow 3 step command-comments - - -  Language- read & follow direction 1 1 1   Write a sentence 1 1 1   Copy design 1 1 1   Copy design-comments - - named 3 animals  Total score 27 24 24     Cranial nerve II-XII: Pupils were equal round reactive to light extraocular movements were full, visual field were full on confrontational test. Facial sensation and strength were normal. hearing was intact to finger rubbing bilaterally. Uvula tongue midline. head turning and shoulder shrug and were normal and symmetric.Tongue protrusion into cheek strength was normal.  Motor: normal bulk and tone, full strength in the BUE, BLE, fine finger movements normal, no pronator drift. No focal weakness.  Unable to appreciate any action or resting tremor.  No tremor present with outstretched arms. Sensory: normal and symmetric to light touch, pinprick, and vibration in the upper and lower extremities Gait and  Station: Rising up from seated position without assistance, normal stance, without trunk ataxia, moderate stride, without evidence of balance difficulties Reflexes: equal and symmetric    ASSESSMENT AND PLAN  48 year African American male with history of tardive dyskinesia due to prior medication side effect as well  as mild cognitive impairment.  History of right brain subcortical infarct due to small vessel disease in December 2011 with no significant residual deficits. Vascular risk factors of hypertension and hyperlipidemia.     1. Tardive dyskinesia Subjectively, tremors completely resolved Objectively, no evidence of tremors Continue primidone 250 mg twice daily  2.  Mild cognitive impairment MMSE today 27/30 (prior 24/30) Stable without worsening Currently living in independent living facility  3.  Recurrent major depression episode, in full remission Continue current antidepressants per psychiatry  4.  History of stroke Continue Plavix and Crestor for secondary stroke prevention Ensure close PCP follow-up for aggressive stroke risk factor management including BP goal<130/90 and HLD with LDL goal<70    Follow-up in 6 months or call earlier if needed  CC:  [facility] provider: Dr. Nicki Reaper, Annie Main, MD     I spent 35 minutes of face-to-face and non-face-to-face time with patient.  This included previsit chart review, lab review, study review, order entry, electronic health record documentation, patient education and discussion regarding tardive dyskinesia, mild cognitive impairment with completion and review of MMSE, depression, prior stroke history and importance of managing stroke risk factors and answered all other questions to patient satisfaction    Frann Rider, AGNP-BC  [facility] [address] [place], [place] [address]  Phone [phone] Fax [phone] Note: This document was prepared with digital dictation and possible  smart phrase technology. Any transcriptional errors that result from this process are unintentional.

## 2020-01-13 ENCOUNTER — Other Ambulatory Visit: Payer: Medicare Other

## 2020-01-24 DIAGNOSIS — Z1159 Encounter for screening for other viral diseases: Secondary | ICD-10-CM | POA: Diagnosis not present

## 2020-01-24 DIAGNOSIS — Z20828 Contact with and (suspected) exposure to other viral communicable diseases: Secondary | ICD-10-CM | POA: Diagnosis not present

## 2020-01-27 DIAGNOSIS — Z20828 Contact with and (suspected) exposure to other viral communicable diseases: Secondary | ICD-10-CM | POA: Diagnosis not present

## 2020-01-30 DIAGNOSIS — M94262 Chondromalacia, left knee: Secondary | ICD-10-CM | POA: Diagnosis not present

## 2020-01-30 DIAGNOSIS — M94212 Chondromalacia, left shoulder: Secondary | ICD-10-CM | POA: Diagnosis not present

## 2020-01-30 DIAGNOSIS — M7542 Impingement syndrome of left shoulder: Secondary | ICD-10-CM | POA: Diagnosis not present

## 2020-01-30 DIAGNOSIS — M24112 Other articular cartilage disorders, left shoulder: Secondary | ICD-10-CM | POA: Diagnosis not present

## 2020-01-30 DIAGNOSIS — M75112 Incomplete rotator cuff tear or rupture of left shoulder, not specified as traumatic: Secondary | ICD-10-CM | POA: Diagnosis not present

## 2020-01-30 DIAGNOSIS — M7552 Bursitis of left shoulder: Secondary | ICD-10-CM | POA: Diagnosis not present

## 2020-01-30 DIAGNOSIS — G8918 Other acute postprocedural pain: Secondary | ICD-10-CM | POA: Diagnosis not present

## 2020-01-31 ENCOUNTER — Ambulatory Visit: Payer: Medicare Other | Admitting: Internal Medicine

## 2020-02-04 DIAGNOSIS — Z20828 Contact with and (suspected) exposure to other viral communicable diseases: Secondary | ICD-10-CM | POA: Diagnosis not present

## 2020-02-04 DIAGNOSIS — F332 Major depressive disorder, recurrent severe without psychotic features: Secondary | ICD-10-CM | POA: Diagnosis not present

## 2020-02-06 DIAGNOSIS — Z20828 Contact with and (suspected) exposure to other viral communicable diseases: Secondary | ICD-10-CM | POA: Diagnosis not present

## 2020-02-10 DIAGNOSIS — Z20828 Contact with and (suspected) exposure to other viral communicable diseases: Secondary | ICD-10-CM | POA: Diagnosis not present

## 2020-02-12 ENCOUNTER — Ambulatory Visit (INDEPENDENT_AMBULATORY_CARE_PROVIDER_SITE_OTHER): Payer: Medicare Other | Admitting: Internal Medicine

## 2020-02-12 ENCOUNTER — Other Ambulatory Visit: Payer: Self-pay

## 2020-02-12 ENCOUNTER — Encounter: Payer: Self-pay | Admitting: Internal Medicine

## 2020-02-12 VITALS — BP 144/90 | HR 74 | Wt 207.0 lb

## 2020-02-12 DIAGNOSIS — I10 Essential (primary) hypertension: Secondary | ICD-10-CM

## 2020-02-12 DIAGNOSIS — I5032 Chronic diastolic (congestive) heart failure: Secondary | ICD-10-CM

## 2020-02-12 DIAGNOSIS — E785 Hyperlipidemia, unspecified: Secondary | ICD-10-CM

## 2020-02-12 NOTE — Patient Instructions (Signed)
Medication Instructions:  Your provider recommends that you continue on your current medications as directed. Please refer to the Current Medication list given to you today.   *If you need a refill on your cardiac medications before your next appointment, please call your pharmacy*  Lab Work: TODAY! BMET, Mag If you have labs (blood work) drawn today and your tests are completely normal, you will receive your results only by: Marland Kitchen MyChart Message (if you have MyChart) OR . A paper copy in the mail If you have any lab test that is abnormal or we need to change your treatment, we will call you to review the results.  Follow-Up: At [facility], you and your health needs are our priority.  As part of our continuing mission to provide you with exceptional heart care, we have created designated Provider Care Teams.  These Care Teams include your primary Cardiologist (physician) and Advanced Practice Providers (APPs -  Physician Assistants and Nurse Practitioners) who all work together to provide you with the care you need, when you need it. Your next appointment:   6 month(s) The format for your next appointment:   In Person Provider:   You may see Werner Lean, MD or one of the following Advanced Practice Providers on your designated Care Team:    Melina Copa, PA-C  Ermalinda Barrios, PA-C

## 2020-02-12 NOTE — Progress Notes (Signed)
Cardiology Office Note:    Date:  02/12/2020   ID:  [name], DOB May 09, 1950, MRN [id number]  PCP:  Christopher Melter, MD  [facility] Cardiologist:  Werner Lean, MD  [place] Electrophysiologist:  None   CC: Follow up diastolic dysfunction  History of Present Illness:    [name] is a 70 y.o. male with a hx of HTN, HLD, Stroke NOS, MCD NOS, who presented for evaluation 01/01/21.  Since prior has been on spironolactone and lasix 40 mg PO Daily.  Did not get labs afterwards.  Patient notes that he is doing well.  Celebrated with birthday and dinner. Since last visit notes improvement in leg swell changes.      No chest pain or pressure .  No SOB/DOE and no PND/Orthopnea.  No weight gain or leg swelling.  No palpitations or syncope .  Ambulatory blood pressure not done.  Lives in an independent living facilty   Past Medical History:  Diagnosis Date  . Depression   . Frequent urination   . GERD (gastroesophageal reflux disease)   . Herpes   . Hyperlipidemia   . Hypertension   . Memory loss   . Obesity   . Stroke ([place])   . Urgency of urination     Past Surgical History:  Procedure Laterality Date  . CHOLECYSTECTOMY N/A 12/17/2019   Procedure: LAPAROSCOPIC CHOLECYSTECTOMY WITH ICG INJECTION;  Surgeon: Rolm Bookbinder, MD;  Location: [facility];  Service: General;  Laterality: N/A;  . disckec    . SPINAL FUSION    . VASECTOMY      Current Medications: Current Meds  Medication Sig  . busPIRone (BUSPAR) 10 MG tablet Take 1 tablet (10 mg total) by mouth 3 (three) times daily.  . candesartan (ATACAND) 32 MG tablet Take 32 mg by mouth daily.  . clopidogrel (PLAVIX) 75 MG tablet Take 1 tablet (75 mg total) by mouth daily.  . furosemide (LASIX) 20 MG tablet 60 mg daily. 40mg  am, 20pm  . HYDROcodone-acetaminophen (NORCO/VICODIN) 5-325 MG tablet Take 1 tablet by mouth as needed.  . metoprolol succinate (TOPROL-XL) 25 MG 24 hr tablet Take 25 mg by  mouth daily.   . ondansetron (ZOFRAN) 4 MG tablet Take 1 tablet (4 mg total) by mouth every 8 (eight) hours as needed for nausea.  Marland Kitchen PARoxetine (PAXIL-CR) 37.5 MG 24 hr tablet Take 1 tablet (37.5 mg total) by mouth at bedtime.  . primidone (MYSOLINE) 250 MG tablet TAKE 1 TABLET BY MOUTH TWICE A DAY  . QUEtiapine (SEROQUEL) 25 MG tablet Take 3 tablets at bedtime (75 mg)  . rosuvastatin (CRESTOR) 10 MG tablet Take 1 tablet (10 mg total) by mouth daily.  Marland Kitchen spironolactone (ALDACTONE) 25 MG tablet Take 1 tablet (25 mg total) by mouth daily.  . Suvorexant (BELSOMRA) 10 MG TABS Take 10 mg by mouth at bedtime.  Marland Kitchen tiZANidine (ZANAFLEX) 2 MG tablet Take by mouth as needed.  . valACYclovir (VALTREX) 1000 MG tablet Take 1,000 mg by mouth as needed (herpes).     Allergies:   Aspirin, Lisinopril, Lithium, Oxycodone, Oxycodone-acetaminophen, and Pork-derived products   Social History   Socioeconomic History  . Marital status: Legally Separated    Spouse name: Not on file  . Number of children: 1  . Years of education: Masters  . Highest education level: Not on file  Occupational History  . Occupation: retired    Fish farm manager: RETIRED  Tobacco Use  . Smoking status: Former Research scientist (life sciences)  .  Smokeless tobacco: Never Used  Substance and Sexual Activity  . Alcohol use: No    Alcohol/week: 1.0 standard drink    Types: 1 Cans of beer per week    Comment: socially  . Drug use: No  . Sexual activity: Never  Other Topics Concern  . Not on file  Social History Narrative   Patient is separated.   Patient lives alone and has one child.   Patient is retired.   Patient is right-handed.   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   Patient does not drink any caffeine.   Social Determinants of Health   Financial Resource Strain: Not on file  Food Insecurity: Not on file  Transportation Needs: Not on file  Physical Activity: Not on file  Stress: Not on file  Social Connections: Not on file     Family  History: The patient's family history includes Cancer in his maternal aunt and maternal aunt; Cancer - Ovarian in his maternal grandmother; Diabetes Mellitus I in his paternal grandmother; Hypertension in his brother, father, mother, paternal grandfather, and sister. History of coronary artery disease notable for no members. History of heart failure notable for no members. No history of cardiomyopathies including hypertrophic cardiomyopathy, left ventricular non-compaction, or arrhythmogenic right ventricular cardiomyopathy. History of arrhythmia notable for no members. Denies family history of sudden cardiac death including drowning, car accidents, or unexplained deaths in the family. No history of bicuspid aortic valve or aortic aneurysm or dissection.  ROS:   Please see the history of present illness.    Notes erectile dysfunction un-amenable to PD5i  All other systems reviewed and are negative.  EKGs/Labs/Other Studies Reviewed:    The following studies were reviewed today:  EKG:   01/24/08 SR 82 WNL 12/16/19:  SR rate 65, iRBBB and LAFB  Transthoracic Echocardiogram: Date: 01/02/20 Results: Severe LA dilation; IVC WNL E/E' 20 RVSP 22 1. Left ventricular ejection fraction, by estimation, is 60 to 65%. The  left ventricle has normal function. The left ventricle has no regional  wall motion abnormalities. There is mild concentric left ventricular  hypertrophy. Left ventricular diastolic  parameters are consistent with Grade II diastolic dysfunction  (pseudonormalization). Elevated left atrial pressure.  2. Right ventricular systolic function is normal. The right ventricular  size is mildly enlarged. There is normal pulmonary artery systolic  pressure.  3. Left atrial size was severely dilated.  4. Right atrial size was moderately dilated.  5. The mitral valve is normal in structure. No evidence of mitral valve  regurgitation. No evidence of mitral stenosis.  6. The aortic  valve is normal in structure. Aortic valve regurgitation is  not visualized. Mild aortic valve sclerosis is present, with no evidence  of aortic valve stenosis.  7. The inferior vena cava is normal in size with greater than 50%  respiratory variability, suggesting right atrial pressure of 3 mmHg.  Recent Labs: 12/15/2019: B Natriuretic Peptide 19.2; TSH 2.605 12/16/2019: Hemoglobin 15.5; Platelets 287 12/18/2019: ALT 73; BUN 14; Creatinine, Ser 1.09; Magnesium 2.0; Potassium 4.2; Sodium 136  Recent Lipid Panel    Component Value Date/Time   CHOL 176 12/17/2019 0241   CHOL 150 05/29/2014 1503   TRIG 181 (H) 12/17/2019 0241   HDL 31 (L) 12/17/2019 0241   HDL 30 (L) 05/29/2014 1503   CHOLHDL 5.7 12/17/2019 0241   VLDL 36 12/17/2019 0241   LDLCALC 109 (H) 12/17/2019 0241   LDLCALC 63 05/29/2014 1503   Risk Assessment/Calculations:     .  The 10-year ASCVD risk score Mikey Bussing DC Brooke Bonito., et al., 2013) is: 23.9%   Values used to calculate the score:     Age: 39 years     Sex: Male     Is Non-Hispanic African American: Yes     Diabetic: No     Tobacco smoker: No     Systolic Blood Pressure: 123456 mmHg     Is BP treated: Yes     HDL Cholesterol: 31 mg/dL     Total Cholesterol: 176 mg/dL   Physical Exam:    VS:  BP (!) 144/90   Pulse 74   Wt 207 lb (93.9 kg)   SpO2 97%   BMI 32.42 kg/m     Wt Readings from Last 3 Encounters:  02/12/20 207 lb (93.9 kg)  01/09/20 205 lb (93 kg)  01/02/20 209 lb (94.8 kg)    GEN:  Well nourished, well developed in no acute distress HEENT: Normal NECK: No JVD; No carotid bruits LYMPHATICS: No lymphadenopathy CARDIAC: RRR, no murmurs, rubs, gallops RESPIRATORY:  Clear to auscultation without rales, wheezing or rhonchi  ABDOMEN: Soft, non-tender, non-distended MUSCULOSKELETAL:  Residual left leg swelling (non-pitting and minimal); No deformity  SKIN: Warm and dry NEUROLOGIC:  Alert and oriented x 3 PSYCHIATRIC:  Normal affect   ASSESSMENT:     1. Chronic heart failure with preserved ejection fraction ([place])   2. Hypertension, unspecified type   3. Hyperlipidemia, unspecified hyperlipidemia type    PLAN:    In order of problems listed above:  Heart Failure preserved Ejection Fraction HTN LE Swelling- improved H2PEF Score 73% suggestive of HFpEF - NYHA class I, Stage II, euvolemic, etiology from HTN - Diuretic regimen: Lasix 40 mg PO daily - daily weights, and fluid restriction of < 2 L  - discussed ambulatory BP monitoring as patient thinks he BP has been better at the facility; if persistently elevated will add hydralazine 10 mg PO BID - BMP Mg in 7/10 days  - Metoprolol succinate 25 mg PO daily - aldactone 25 mg PO daily  Erectile Dysfunction Hyperlipidemia -LDL goal less than 100 -Continue rosuvastatin 10 mg PO daily -Recheck lipid profile LFTs at next visit - gave education on dietary changes  6 months follow up unless new symptoms or abnormal test results warranting change in plan  Would be reasonable for  Virtual Follow up  Would be reasonable for  APP Follow up   Medication Adjustments/Labs and Tests Ordered: Current medicines are reviewed at length with the patient today.  Concerns regarding medicines are outlined above.  Orders Placed This Encounter  Procedures  . Magnesium  . Basic metabolic panel   No orders of the defined types were placed in this encounter.   Patient Instructions  Medication Instructions:  Your provider recommends that you continue on your current medications as directed. Please refer to the Current Medication list given to you today.   *If you need a refill on your cardiac medications before your next appointment, please call your pharmacy*  Lab Work: TODAY! BMET, Mag If you have labs (blood work) drawn today and your tests are completely normal, you will receive your results only by: Marland Kitchen MyChart Message (if you have MyChart) OR . A paper copy in the mail If you have any  lab test that is abnormal or we need to change your treatment, we will call you to review the results.  Follow-Up: At [facility], you and your health needs are our priority.  As  part of our continuing mission to provide you with exceptional heart care, we have created designated Provider Care Teams.  These Care Teams include your primary Cardiologist (physician) and Advanced Practice Providers (APPs -  Physician Assistants and Nurse Practitioners) who all work together to provide you with the care you need, when you need it. Your next appointment:   6 month(s) The format for your next appointment:   In Person Provider:   You may see Werner Lean, MD or one of the following Advanced Practice Providers on your designated Care Team:    Melina Copa, PA-C  Ermalinda Barrios, PA-C      Signed, Werner Lean, MD  02/12/2020 10:19 AM    [place]

## 2020-02-13 DIAGNOSIS — Z20828 Contact with and (suspected) exposure to other viral communicable diseases: Secondary | ICD-10-CM | POA: Diagnosis not present

## 2020-02-13 LAB — BASIC METABOLIC PANEL
BUN/Creatinine Ratio: 9 — ABNORMAL LOW (ref 10–24)
BUN: 9 mg/dL (ref 8–27)
CO2: 23 mmol/L (ref 20–29)
Calcium: 9.2 mg/dL (ref 8.6–10.2)
Chloride: 102 mmol/L (ref 96–106)
Creatinine, Ser: 1.05 mg/dL (ref 0.76–1.27)
GFR calc Af Amer: 84 mL/min/{1.73_m2} (ref >59)
GFR calc non Af Amer: 73 mL/min/{1.73_m2} (ref >59)
Glucose: 111 mg/dL — ABNORMAL HIGH (ref 65–99)
Potassium: 3.7 mmol/L (ref 3.5–5.2)
Sodium: 140 mmol/L (ref 134–144)

## 2020-02-13 LAB — MAGNESIUM: Magnesium: 1.9 mg/dL (ref 1.6–2.3)

## 2020-02-14 ENCOUNTER — Telehealth: Payer: Self-pay | Admitting: Internal Medicine

## 2020-02-14 NOTE — Telephone Encounter (Signed)
Reasonable to continue ibuprofen- BP controlled and this is temporary after he gets to surgery. Has taken this for a while without kidney complications that are known.  Werner Lean, MD

## 2020-02-14 NOTE — Telephone Encounter (Signed)
Pt called in and stated that he had shoulder surgery about 11 days ago. The orthopedic doctor told pt that he needed to call Cardiology to check and see if it was ok for him to be taking ibuprofen?  Pt stated he has been taking Ibuprofen for a very long time. He states he usually takes 800 mg BID for rotator cuff pain. Patient states his pain has not gotten any better since having the surgery. Patient was prescribed  HYDROcodone-acetaminophen (NORCO/VICODIN) 5-325 MG tablet by his orthopedic MD, Dr. Tamera Punt post surgery on 1/13. He states he is already out of this medication. He states he had to take it multiple times per day but it did not seem to help. Patient is now currently back to 800 mg BID Ibuprofen to control shoulder pain. Routing to [facility], MD and PharmD for further advisement.

## 2020-02-14 NOTE — Telephone Encounter (Signed)
Pt called in and state that he had shoulder surgery.  The ortho dr told pt that he needed to call [facility] to check and see if it was ok for him to be taking ibuprofen?  Pt stated he has been taking Ibuprofen for a very long time    Best number  [phone]

## 2020-02-14 NOTE — Telephone Encounter (Signed)
Patient made aware of Dr. Clovis Fredrickson recommendations.

## 2020-02-14 NOTE — Telephone Encounter (Signed)
Follow up:    Patient calling back to state he can take Asprin. Please call patient.

## 2020-02-14 NOTE — Telephone Encounter (Signed)
Caster is returning [name]'s call. Please advise.

## 2020-02-14 NOTE — Telephone Encounter (Signed)
Reasonable to continue ibuprofen- BP controlled and this is temporary after he gets to surgery. Has taken this for a while without kidney complications that are known. Werner Lean, MD  Called patient back to inform him of the above recommendations. Patient did not answer and voicemail box was full so unable to leave a message at this time.

## 2020-02-17 DIAGNOSIS — Z20828 Contact with and (suspected) exposure to other viral communicable diseases: Secondary | ICD-10-CM | POA: Diagnosis not present

## 2020-02-19 DIAGNOSIS — M6281 Muscle weakness (generalized): Secondary | ICD-10-CM | POA: Diagnosis not present

## 2020-02-19 DIAGNOSIS — Z4889 Encounter for other specified surgical aftercare: Secondary | ICD-10-CM | POA: Diagnosis not present

## 2020-02-19 DIAGNOSIS — M25312 Other instability, left shoulder: Secondary | ICD-10-CM | POA: Diagnosis not present

## 2020-02-21 DIAGNOSIS — M6281 Muscle weakness (generalized): Secondary | ICD-10-CM | POA: Diagnosis not present

## 2020-02-21 DIAGNOSIS — Z4889 Encounter for other specified surgical aftercare: Secondary | ICD-10-CM | POA: Diagnosis not present

## 2020-02-21 DIAGNOSIS — M25312 Other instability, left shoulder: Secondary | ICD-10-CM | POA: Diagnosis not present

## 2020-02-24 DIAGNOSIS — Z20828 Contact with and (suspected) exposure to other viral communicable diseases: Secondary | ICD-10-CM | POA: Diagnosis not present

## 2020-02-25 DIAGNOSIS — M1711 Unilateral primary osteoarthritis, right knee: Secondary | ICD-10-CM | POA: Diagnosis not present

## 2020-02-26 DIAGNOSIS — Z4889 Encounter for other specified surgical aftercare: Secondary | ICD-10-CM | POA: Diagnosis not present

## 2020-02-26 DIAGNOSIS — M25312 Other instability, left shoulder: Secondary | ICD-10-CM | POA: Diagnosis not present

## 2020-02-26 DIAGNOSIS — M6281 Muscle weakness (generalized): Secondary | ICD-10-CM | POA: Diagnosis not present

## 2020-02-27 DIAGNOSIS — Z20828 Contact with and (suspected) exposure to other viral communicable diseases: Secondary | ICD-10-CM | POA: Diagnosis not present

## 2020-02-28 DIAGNOSIS — M6281 Muscle weakness (generalized): Secondary | ICD-10-CM | POA: Diagnosis not present

## 2020-02-28 DIAGNOSIS — M25312 Other instability, left shoulder: Secondary | ICD-10-CM | POA: Diagnosis not present

## 2020-02-28 DIAGNOSIS — Z4889 Encounter for other specified surgical aftercare: Secondary | ICD-10-CM | POA: Diagnosis not present

## 2020-03-02 DIAGNOSIS — Z20828 Contact with and (suspected) exposure to other viral communicable diseases: Secondary | ICD-10-CM | POA: Diagnosis not present

## 2020-03-03 DIAGNOSIS — F332 Major depressive disorder, recurrent severe without psychotic features: Secondary | ICD-10-CM | POA: Diagnosis not present

## 2020-03-04 DIAGNOSIS — Z4889 Encounter for other specified surgical aftercare: Secondary | ICD-10-CM | POA: Diagnosis not present

## 2020-03-04 DIAGNOSIS — M25312 Other instability, left shoulder: Secondary | ICD-10-CM | POA: Diagnosis not present

## 2020-03-04 DIAGNOSIS — M6281 Muscle weakness (generalized): Secondary | ICD-10-CM | POA: Diagnosis not present

## 2020-03-05 ENCOUNTER — Telehealth: Payer: Self-pay | Admitting: *Deleted

## 2020-03-05 ENCOUNTER — Ambulatory Visit (INDEPENDENT_AMBULATORY_CARE_PROVIDER_SITE_OTHER): Payer: Medicare Other | Admitting: Psychiatry

## 2020-03-05 ENCOUNTER — Other Ambulatory Visit: Payer: Self-pay

## 2020-03-05 ENCOUNTER — Encounter (HOSPITAL_COMMUNITY): Payer: Self-pay | Admitting: Psychiatry

## 2020-03-05 VITALS — BP 153/97 | HR 75 | Ht 66.0 in | Wt 212.0 lb

## 2020-03-05 DIAGNOSIS — F3342 Major depressive disorder, recurrent, in full remission: Secondary | ICD-10-CM | POA: Diagnosis not present

## 2020-03-05 DIAGNOSIS — F419 Anxiety disorder, unspecified: Secondary | ICD-10-CM

## 2020-03-05 DIAGNOSIS — M25519 Pain in unspecified shoulder: Secondary | ICD-10-CM | POA: Diagnosis not present

## 2020-03-05 DIAGNOSIS — Z20828 Contact with and (suspected) exposure to other viral communicable diseases: Secondary | ICD-10-CM | POA: Diagnosis not present

## 2020-03-05 DIAGNOSIS — I10 Essential (primary) hypertension: Secondary | ICD-10-CM | POA: Diagnosis not present

## 2020-03-05 MED ORDER — BELSOMRA 10 MG PO TABS: 10.0000 mg | ORAL_TABLET | Freq: Every day | ORAL | 2 refills | Status: DC

## 2020-03-05 MED ORDER — BUSPIRONE HCL 10 MG PO TABS: 10.0000 mg | ORAL_TABLET | Freq: Three times a day (TID) | ORAL | 2 refills | Status: DC

## 2020-03-05 MED ORDER — PAROXETINE HCL ER 37.5 MG PO TB24: 37.5000 mg | ORAL_TABLET | Freq: Every day | ORAL | 2 refills | Status: DC

## 2020-03-05 MED ORDER — QUETIAPINE FUMARATE 25 MG PO TABS: ORAL_TABLET | ORAL | 2 refills | Status: DC

## 2020-03-05 NOTE — Telephone Encounter (Signed)
Received DOD call from patient's PCP office re: elevated BPs. Last week BP there was 190-106, he was instructed to take an extra metoprolol.  Today BP was 182/104.    Dr. Angelena Form spoke with NP Carlisle Cater.  Adv to have pt increase Toprol XL to 50 mg and that we would let his primary cardiologist know.   She will contact patient with recommended change.

## 2020-03-05 NOTE — Telephone Encounter (Signed)
Mahesh, I had them bump up his Toprol to 50 mg daily. He is being asked to follow his BP at home over the next week. Gerald Stabs
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
## 2020-03-06 DIAGNOSIS — Z4889 Encounter for other specified surgical aftercare: Secondary | ICD-10-CM | POA: Diagnosis not present

## 2020-03-06 DIAGNOSIS — M6281 Muscle weakness (generalized): Secondary | ICD-10-CM | POA: Diagnosis not present

## 2020-03-06 DIAGNOSIS — M25312 Other instability, left shoulder: Secondary | ICD-10-CM | POA: Diagnosis not present

## 2020-03-10 NOTE — Telephone Encounter (Signed)
Late entry: offered pt virtual visit per provider request.

## 2020-03-11 ENCOUNTER — Ambulatory Visit (HOSPITAL_COMMUNITY): Payer: Medicare Other | Admitting: Psychiatry

## 2020-03-11 DIAGNOSIS — M25312 Other instability, left shoulder: Secondary | ICD-10-CM | POA: Diagnosis not present

## 2020-03-11 DIAGNOSIS — M6281 Muscle weakness (generalized): Secondary | ICD-10-CM | POA: Diagnosis not present

## 2020-03-11 DIAGNOSIS — Z4889 Encounter for other specified surgical aftercare: Secondary | ICD-10-CM | POA: Diagnosis not present

## 2020-03-12 ENCOUNTER — Telehealth: Payer: Self-pay | Admitting: Internal Medicine

## 2020-03-12 MED ORDER — HYDRALAZINE HCL 25 MG PO TABS: 25.0000 mg | ORAL_TABLET | Freq: Two times a day (BID) | ORAL | 3 refills | Status: DC

## 2020-03-12 MED ORDER — AMLODIPINE BESYLATE 5 MG PO TABS: 5.0000 mg | ORAL_TABLET | Freq: Every day | ORAL | 3 refills | Status: DC

## 2020-03-12 NOTE — Telephone Encounter (Signed)
Pt calling in to report "severe headaches and high blood pressure"  He states blood pressure was 197/107 and 197/94 -these were both taken on the same day (did not specify which date).  Pt states his blood pressure this morning was 174/107.  States his headache is a 9/10.  Reviewed Pt's med list with him and confirmed he is taking all HTN medications as noted.  Recent increase in Toprol XL to 50 mg daily on 03/05/20 by Dr. Angelena Form DOD-confirmed Pt made this change.   Additionally he is upset that he has not been able to reach office.  He left his phone number to get a call back and was not called back.  He is requesting a referral to another cardiologist.  Explained that changing to another cardiologist would not fix that issue and would communicate his frustration to a supervisor.  Forwarding to primary cardiologist for advisement.

## 2020-03-12 NOTE — Telephone Encounter (Signed)
Pt c/o Shortness Of Breath: STAT if SOB developed within the last 24 hours or pt is noticeably SOB on the phone  1. Are you currently SOB (can you hear that pt is SOB on the phone)? No, patient states the SOB is very mild. He states it is not a major concern right now. His main concern is his BP   2. How long have you been experiencing SOB? About 1 week per patient  3. Are you SOB when sitting or when up moving around? When up and moving around   4. Are you currently experiencing any other symptoms? Elevated BP, headaches  Pt c/o BP issue: STAT if pt c/o blurred vision, one-sided weakness or slurred speech  1. What are your last 5 BP readings?   2. Are you having any other symptoms (ex. Dizziness, headache, blurred vision, passed out)? Mild SOB, headaches   3. What is your BP issue?  BP has been elevated, causing headaches. Patient states even with Toprol increase, BP is still extremely high.

## 2020-03-12 NOTE — Telephone Encounter (Signed)
Spoke with pt he reports 0630 BP reading of 174/107 with associated headache.  Dr. Gasper Sells notified 2 new medications to be added: norvasc 5 mg PO QD and hydralazine 25mg  PO BID.  [name] made aware that medications will be called in today and he should pick them up and start taking them today.  Also informed him to go to the ED if symptoms persist and he doesn't feel any better.  He verbalized understanding.

## 2020-03-13 DIAGNOSIS — M25312 Other instability, left shoulder: Secondary | ICD-10-CM | POA: Diagnosis not present

## 2020-03-13 DIAGNOSIS — Z4889 Encounter for other specified surgical aftercare: Secondary | ICD-10-CM | POA: Diagnosis not present

## 2020-03-13 DIAGNOSIS — M6281 Muscle weakness (generalized): Secondary | ICD-10-CM | POA: Diagnosis not present

## 2020-03-16 ENCOUNTER — Encounter: Payer: Self-pay | Admitting: Internal Medicine

## 2020-03-16 ENCOUNTER — Telehealth: Payer: Self-pay | Admitting: Internal Medicine

## 2020-03-16 ENCOUNTER — Other Ambulatory Visit: Payer: Self-pay

## 2020-03-16 ENCOUNTER — Ambulatory Visit (INDEPENDENT_AMBULATORY_CARE_PROVIDER_SITE_OTHER): Payer: Medicare Other | Admitting: Internal Medicine

## 2020-03-16 VITALS — BP 150/80 | HR 63 | Ht 67.0 in | Wt 215.2 lb

## 2020-03-16 DIAGNOSIS — I10 Essential (primary) hypertension: Secondary | ICD-10-CM | POA: Diagnosis not present

## 2020-03-16 DIAGNOSIS — E785 Hyperlipidemia, unspecified: Secondary | ICD-10-CM

## 2020-03-16 DIAGNOSIS — I5032 Chronic diastolic (congestive) heart failure: Secondary | ICD-10-CM

## 2020-03-16 MED ORDER — HYDRALAZINE HCL 50 MG PO TABS: 50.0000 mg | ORAL_TABLET | Freq: Two times a day (BID) | ORAL | 3 refills | Status: DC

## 2020-03-16 MED ORDER — HYDRALAZINE HCL 50 MG PO TABS: 50.0000 mg | ORAL_TABLET | Freq: Three times a day (TID) | ORAL | 3 refills | Status: DC

## 2020-03-16 NOTE — Progress Notes (Signed)
Cardiology Office Note:    Date:  03/16/2020   ID:  [name], DOB 1950/10/27, MRN [id number]  PCP:  Orpah Melter, MD  [facility] Cardiologist:  Werner Lean, MD  [place] Electrophysiologist:  None   CC: Follow up BP elevation  History of Present Illness:    [name] is a 70 y.o. male with a hx of HTN, HLD, Stroke NOS, MCD NOS, who presented for evaluation 01/01/21.  Since prior has been on spironolactone and lasix 40 mg PO Daily.  Did not get labs afterwards.  Last seen 02/12/20.  In interim had DOD visit increase in medication,   In interim of this visit, patient had worsening headache and had start on amlodipine and hydralazine.  Patient notes that he is doing poorly.  Last visit notes worsen headache with elevated BP.  When his BP is eye he has headaches and his eye balls hurt.    There are no interval hospital/ED visit.    No chest pain or pressure.  Also some shortness of breath with elevated blood pressure.  No weight gain or leg swelling.  No palpitations or syncope.  Ambulatory blood pressure is 150/90 at baseline, 175/90 with symptoms.  Patient notes that his girlfriend is stressing him out as she is jealous, and this raises his blood pressure and gives him a headache.  Has some chronic pain that has been helped with oxycodone and antiemetic (not on MAR).  Lives in an independent living facilty   Past Medical History:  Diagnosis Date  . Depression   . Frequent urination   . GERD (gastroesophageal reflux disease)   . Herpes   . Hyperlipidemia   . Hypertension   . Memory loss   . Obesity   . Stroke ([place])   . Urgency of urination     Past Surgical History:  Procedure Laterality Date  . CHOLECYSTECTOMY N/A 12/17/2019   Procedure: LAPAROSCOPIC CHOLECYSTECTOMY WITH ICG INJECTION;  Surgeon: Rolm Bookbinder, MD;  Location: [facility];  Service: General;  Laterality: N/A;  . disckec    . SPINAL FUSION    . VASECTOMY      Current  Medications: Current Meds  Medication Sig  . amLODipine (NORVASC) 5 MG tablet Take 1 tablet (5 mg total) by mouth daily.  . busPIRone (BUSPAR) 10 MG tablet Take 1 tablet (10 mg total) by mouth 3 (three) times daily.  . candesartan (ATACAND) 32 MG tablet Take 32 mg by mouth daily.  . clindamycin (CLEOCIN) 150 MG capsule as directed. Dental visits  . clopidogrel (PLAVIX) 75 MG tablet Take 1 tablet (75 mg total) by mouth daily.  . Diclofenac Sodium 3 % GEL as needed.  . furosemide (LASIX) 20 MG tablet 60 mg daily. 40mg  am, 20pm  . HYDROcodone-acetaminophen (NORCO/VICODIN) 5-325 MG tablet Take 1 tablet by mouth as needed.  . meloxicam (MOBIC) 15 MG tablet Take 15 mg by mouth daily.  . metoprolol succinate (TOPROL-XL) 25 MG 24 hr tablet Take 25 mg by mouth daily.   . ondansetron (ZOFRAN) 4 MG tablet Take 1 tablet (4 mg total) by mouth every 8 (eight) hours as needed for nausea.  Marland Kitchen PARoxetine (PAXIL-CR) 37.5 MG 24 hr tablet Take 1 tablet (37.5 mg total) by mouth at bedtime.  . primidone (MYSOLINE) 250 MG tablet TAKE 1 TABLET BY MOUTH TWICE A DAY  . QUEtiapine (SEROQUEL) 25 MG tablet Take 3 tablets at bedtime (75 mg)  . rosuvastatin (CRESTOR) 10 MG tablet Take 1  tablet (10 mg total) by mouth daily.  . sildenafil (VIAGRA) 50 MG tablet as needed.  Marland Kitchen spironolactone (ALDACTONE) 25 MG tablet Take 1 tablet (25 mg total) by mouth daily.  . Suvorexant (BELSOMRA) 10 MG TABS Take 10 mg by mouth at bedtime.  Marland Kitchen tiZANidine (ZANAFLEX) 2 MG tablet Take by mouth as needed.  . valACYclovir (VALTREX) 1000 MG tablet Take 1,000 mg by mouth as needed (herpes).  . [DISCONTINUED] hydrALAZINE (APRESOLINE) 25 MG tablet Take 1 tablet (25 mg total) by mouth 2 (two) times daily.  . [DISCONTINUED] hydrALAZINE (APRESOLINE) 50 MG tablet Take 1 tablet (50 mg total) by mouth 3 (three) times daily.     Allergies:   Aspirin, Lisinopril, Lithium, Oxycodone, Oxycodone-acetaminophen, and Pork-derived products   Social History    Socioeconomic History  . Marital status: Legally Separated    Spouse name: Not on file  . Number of children: 1  . Years of education: Masters  . Highest education level: Not on file  Occupational History  . Occupation: retired    Fish farm manager: RETIRED  Tobacco Use  . Smoking status: Former Research scientist (life sciences)  . Smokeless tobacco: Never Used  Substance and Sexual Activity  . Alcohol use: No    Alcohol/week: 1.0 standard drink    Types: 1 Cans of beer per week    Comment: socially  . Drug use: No  . Sexual activity: Never  Other Topics Concern  . Not on file  Social History Narrative   Patient is separated.   Patient lives alone and has one child.   Patient is retired.   Patient is right-handed.   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   Patient does not drink any caffeine.   Social Determinants of Health   Financial Resource Strain: Not on file  Food Insecurity: Not on file  Transportation Needs: Not on file  Physical Activity: Not on file  Stress: Not on file  Social Connections: Not on file     Family History: The patient's family history includes Cancer in his maternal aunt and maternal aunt; Cancer - Ovarian in his maternal grandmother; Diabetes Mellitus I in his paternal grandmother; Hypertension in his brother, father, mother, paternal grandfather, and sister. History of coronary artery disease notable for no members. History of heart failure notable for no members. No history of cardiomyopathies including hypertrophic cardiomyopathy, left ventricular non-compaction, or arrhythmogenic right ventricular cardiomyopathy. History of arrhythmia notable for no members. Denies family history of sudden cardiac death including drowning, car accidents, or unexplained deaths in the family. No history of bicuspid aortic valve or aortic aneurysm or dissection.  ROS:   Please see the history of present illness.     All other systems reviewed and are negative.  EKGs/Labs/Other Studies  Reviewed:    The following studies were reviewed today:  EKG:   01/24/08 SR 82 WNL 12/16/19:  SR rate 65, iRBBB and LAFB  Transthoracic Echocardiogram: Date: 01/02/20 Results: Severe LA dilation; IVC WNL E/E' 20 RVSP 22 1. Left ventricular ejection fraction, by estimation, is 60 to 65%. The  left ventricle has normal function. The left ventricle has no regional  wall motion abnormalities. There is mild concentric left ventricular  hypertrophy. Left ventricular diastolic  parameters are consistent with Grade II diastolic dysfunction  (pseudonormalization). Elevated left atrial pressure.  2. Right ventricular systolic function is normal. The right ventricular  size is mildly enlarged. There is normal pulmonary artery systolic  pressure.  3. Left atrial size was severely dilated.  4. Right atrial size was moderately dilated.  5. The mitral valve is normal in structure. No evidence of mitral valve  regurgitation. No evidence of mitral stenosis.  6. The aortic valve is normal in structure. Aortic valve regurgitation is  not visualized. Mild aortic valve sclerosis is present, with no evidence  of aortic valve stenosis.  7. The inferior vena cava is normal in size with greater than 50%  respiratory variability, suggesting right atrial pressure of 3 mmHg.  Recent Labs: 12/15/2019: B Natriuretic Peptide 19.2; TSH 2.605 12/16/2019: Hemoglobin 15.5; Platelets 287 12/18/2019: ALT 73 02/12/2020: BUN 9; Creatinine, Ser 1.05; Magnesium 1.9; Potassium 3.7; Sodium 140  Recent Lipid Panel    Component Value Date/Time   CHOL 176 12/17/2019 0241   CHOL 150 05/29/2014 1503   TRIG 181 (H) 12/17/2019 0241   HDL 31 (L) 12/17/2019 0241   HDL 30 (L) 05/29/2014 1503   CHOLHDL 5.7 12/17/2019 0241   VLDL 36 12/17/2019 0241   LDLCALC 109 (H) 12/17/2019 0241   LDLCALC 63 05/29/2014 1503   Risk Assessment/Calculations:     .The 10-year ASCVD risk score Mikey Bussing DC Brooke Bonito., et al., 2013) is: 25.6%    Values used to calculate the score:     Age: 58 years     Sex: Male     Is Non-Hispanic African American: Yes     Diabetic: No     Tobacco smoker: No     Systolic Blood Pressure: 539 mmHg     Is BP treated: Yes     HDL Cholesterol: 31 mg/dL     Total Cholesterol: 176 mg/dL   Physical Exam:    VS:  BP (!) 150/80   Pulse 63   Ht 5\' 7"  (1.702 m)   Wt 215 lb 3.2 oz (97.6 kg)   SpO2 96%   BMI 33.71 kg/m     Repeat BP left arm 150/80 (manual)  Wt Readings from Last 3 Encounters:  03/16/20 215 lb 3.2 oz (97.6 kg)  02/12/20 207 lb (93.9 kg)  01/09/20 205 lb (93 kg)    GEN:  Well nourished, well developed in no acute distress HEENT: Normal NECK: No JVD; No carotid bruits LYMPHATICS: No lymphadenopathy CARDIAC: RRR, no murmurs, rubs, gallops RESPIRATORY:  Clear to auscultation without rales, wheezing or rhonchi  ABDOMEN: Soft, non-tender, non-distended MUSCULOSKELETAL:  Residual left leg swelling (non-pitting and minimal); No deformity  SKIN: Warm and dry NEUROLOGIC:  Alert and oriented x 3 PSYCHIATRIC:  Normal affect   ASSESSMENT:    1. Hypertension, unspecified type   2. Chronic heart failure with preserved ejection fraction ([place])   3. Hyperlipidemia, unspecified hyperlipidemia type    PLAN:    In order of problems listed above:  Heart Failure preserved Ejection Fraction HTN - NYHA class I, Stage II, euvolemic, etiology from HTN - Diuretic regimen: Lasix 40 mg PO daily - daily weights, and fluid restriction of < 2 L  - Norvasc 5 mg- patient thinks there was issues with this medication but cannot remember - Will increase to hydralazine 50 mg PO BID - will send to Pharm D clinic for assistance - Metoprolol succinate 25 mg PO daily - aldactone 25 mg PO daily  Erectile Dysfunction Hyperlipidemia -LDL goal less than 100 -Continue rosuvastatin 10 mg PO daily -Recheck lipid profile LFTs at next visit 3 month f/u - gave education on dietary changes  Three month  follow up unless new symptoms or abnormal test results warranting change in plan  Would be reasonable for  APP Follow up  We will send a copy of this note to patients primary MD; patient also notes questions about his chronic pain management.  Medication Adjustments/Labs and Tests Ordered: Current medicines are reviewed at length with the patient today.  Concerns regarding medicines are outlined above.  Orders Placed This Encounter  Procedures  . AMB Referral to [facility]   Meds ordered this encounter  Medications  . DISCONTD: hydrALAZINE (APRESOLINE) 50 MG tablet    Sig: Take 1 tablet (50 mg total) by mouth 3 (three) times daily.    Dispense:  270 tablet    Refill:  3  . hydrALAZINE (APRESOLINE) 50 MG tablet    Sig: Take 1 tablet (50 mg total) by mouth 2 (two) times daily.    Dispense:  270 tablet    Refill:  3    Patient Instructions  Medication Instructions:  Your physician has recommended you make the following change in your medication:    INCREASE: hydralazine (Apresoline) to 50mg  by mouth twice daily *If you need a refill on your cardiac medications before your next appointment, please call your pharmacy*   Lab Work: NONE If you have labs (blood work) drawn today and your tests are completely normal, you will receive your results only by: Marland Kitchen MyChart Message (if you have MyChart) OR . A paper copy in the mail If you have any lab test that is abnormal or we need to change your treatment, we will call you to review the results.   Testing/Procedures: Your Physician has referred you to our Hypertension Clinic.    Follow-Up: At [facility], you and your health needs are our priority.  As part of our continuing mission to provide you with exceptional heart care, we have created designated Provider Care Teams.  These Care Teams include your primary Cardiologist (physician) and Advanced Practice Providers (APPs -  Physician Assistants and Nurse Practitioners) who  all work together to provide you with the care you need, when you need it.   Your next appointment:   3 month(s)  The format for your next appointment:   In Person  Provider:   You may see Werner Lean, MD or one of the following Advanced Practice Providers on your designated Care Team:    Melina Copa, PA-C  Ermalinda Barrios, PA-C    Other Instructions    Please continue to check BP once or twice daily and keep record for upcoming appointments.      Signed, Werner Lean, MD  03/16/2020 3:40 PM    [facility]

## 2020-03-16 NOTE — Telephone Encounter (Signed)
[place] [phone] and left a message to return our call regarding clarification on Hydralazine Rx.

## 2020-03-16 NOTE — Telephone Encounter (Signed)
Pt c/o medication issue:  1. Name of Medication: hydrALAZINE (APRESOLINE) 50 MG tablet  2. How are you currently taking this medication (dosage and times per day)? 1 tablet (50 mg total) by mouth 2-3 (two) times daily  3. Are you having a reaction (difficulty breathing--STAT)? No   4. What is your medication issue?   Sharyn Lull with [facility] is requesting clarification on medication instructions. Please return call to discuss.

## 2020-03-16 NOTE — Patient Instructions (Addendum)
Medication Instructions:  Your physician has recommended you make the following change in your medication:    INCREASE: hydralazine (Apresoline) to 50mg  by mouth twice daily *If you need a refill on your cardiac medications before your next appointment, please call your pharmacy*   Lab Work: NONE If you have labs (blood work) drawn today and your tests are completely normal, you will receive your results only by: Marland Kitchen MyChart Message (if you have MyChart) OR . A paper copy in the mail If you have any lab test that is abnormal or we need to change your treatment, we will call you to review the results.   Testing/Procedures: Your Physician has referred you to our Hypertension Clinic.    Follow-Up: At [facility], you and your health needs are our priority.  As part of our continuing mission to provide you with exceptional heart care, we have created designated Provider Care Teams.  These Care Teams include your primary Cardiologist (physician) and Advanced Practice Providers (APPs -  Physician Assistants and Nurse Practitioners) who all work together to provide you with the care you need, when you need it.   Your next appointment:   3 month(s)  The format for your next appointment:   In Person  Provider:   You may see Werner Lean, MD or one of the following Advanced Practice Providers on your designated Care Team:    Melina Copa, PA-C  Ermalinda Barrios, PA-C    Other Instructions    Please continue to check BP once or twice daily and keep record for upcoming appointments.

## 2020-03-17 DIAGNOSIS — R059 Cough, unspecified: Secondary | ICD-10-CM | POA: Diagnosis not present

## 2020-03-17 DIAGNOSIS — J329 Chronic sinusitis, unspecified: Secondary | ICD-10-CM | POA: Diagnosis not present

## 2020-03-17 DIAGNOSIS — R519 Headache, unspecified: Secondary | ICD-10-CM | POA: Diagnosis not present

## 2020-03-17 NOTE — Telephone Encounter (Signed)
Clarified with pharmacist that rx should be hydralazine 50mg  BID

## 2020-03-19 DIAGNOSIS — Z20828 Contact with and (suspected) exposure to other viral communicable diseases: Secondary | ICD-10-CM | POA: Diagnosis not present

## 2020-03-20 DIAGNOSIS — M25312 Other instability, left shoulder: Secondary | ICD-10-CM | POA: Diagnosis not present

## 2020-03-20 DIAGNOSIS — M6281 Muscle weakness (generalized): Secondary | ICD-10-CM | POA: Diagnosis not present

## 2020-03-20 DIAGNOSIS — Z4889 Encounter for other specified surgical aftercare: Secondary | ICD-10-CM | POA: Diagnosis not present

## 2020-03-23 DIAGNOSIS — Z20828 Contact with and (suspected) exposure to other viral communicable diseases: Secondary | ICD-10-CM | POA: Diagnosis not present

## 2020-03-24 ENCOUNTER — Encounter (HOSPITAL_COMMUNITY): Payer: Self-pay

## 2020-03-24 ENCOUNTER — Other Ambulatory Visit: Payer: Self-pay

## 2020-03-24 ENCOUNTER — Emergency Department (HOSPITAL_COMMUNITY)
Admission: EM | Admit: 2020-03-24 | Discharge: 2020-03-24 | Disposition: A | Discharge status: Home / Self Care | Payer: Medicare Other | Attending: Emergency Medicine | Admitting: Emergency Medicine

## 2020-03-24 DIAGNOSIS — G4489 Other headache syndrome: Secondary | ICD-10-CM | POA: Diagnosis not present

## 2020-03-24 DIAGNOSIS — R1111 Vomiting without nausea: Secondary | ICD-10-CM | POA: Diagnosis not present

## 2020-03-24 DIAGNOSIS — Z5321 Procedure and treatment not carried out due to patient leaving prior to being seen by health care provider: Secondary | ICD-10-CM | POA: Diagnosis not present

## 2020-03-24 DIAGNOSIS — R519 Headache, unspecified: Secondary | ICD-10-CM | POA: Insufficient documentation

## 2020-03-24 DIAGNOSIS — S0990XA Unspecified injury of head, initial encounter: Secondary | ICD-10-CM | POA: Diagnosis not present

## 2020-03-24 DIAGNOSIS — R110 Nausea: Secondary | ICD-10-CM | POA: Diagnosis not present

## 2020-03-24 NOTE — ED Triage Notes (Addendum)
Pt arrives EMS from [place] with c/o intermittent headache x 3 weeks. Dx with sinus infection 4 days ago. Took ibuprofen pta but vomited it up. Denies blurred vision, or numbness.

## 2020-03-24 NOTE — ED Notes (Signed)
Called 3x for room placement. Eloped from waiting area.  

## 2020-03-26 ENCOUNTER — Ambulatory Visit: Payer: Medicare Other

## 2020-03-26 NOTE — Progress Notes (Deleted)
Patient ID: [name]                 DOB: March 16, 1950                      MRN: [id number]     HPI: [name] is a 70 y.o. male referred by Dr. Gasper Sells to HTN clinic. PMH is significant for HTN, HLD, Stroke NOS, MCD NOS and HFpEF.   Amlodipine? Compliance Social hx Dizziness, lightheadedness, headache, blurred vision, SOB, swelling   Current HTN meds: amlodipine 5mg  daily, candesartan 32mg  daily, furosemide 40mg  AM,20mg PM, hydralazine 50mg  twice a day, metoprolol succinate 25mg  daily, spironolactone 25mg  daily Previously tried: lisinopril (cough) BP goal: <130/80  Family History: The patient's family history includes Cancer in his maternal aunt and maternal aunt; Cancer - Ovarian in his maternal grandmother; Diabetes Mellitus I in his paternal grandmother; Hypertension in his brother, father, mother, paternal grandfather, and sister. History of coronary artery disease notable for no members. History of heart failure notable for no members. No history of cardiomyopathies including hypertrophic cardiomyopathy, left ventricular non-compaction, or arrhythmogenic right ventricular cardiomyopathy. History of arrhythmia notable for no members. Denies family history of sudden cardiac death including drowning, car accidents, or unexplained deaths in the family. No history of bicuspid aortic valve or aortic aneurysm or dissection  Social History:   Diet:   Exercise:   Home BP readings:   Wt Readings from Last 3 Encounters:  03/16/20 215 lb 3.2 oz (97.6 kg)  03/05/20 212 lb (96.2 kg)  02/12/20 207 lb (93.9 kg)   BP Readings from Last 3 Encounters:  03/24/20 (!) 157/94  03/16/20 (!) 150/80  03/05/20 (!) 153/97   Pulse Readings from Last 3 Encounters:  03/24/20 60  03/16/20 63  03/05/20 75    Renal function: CrCl cannot be calculated (Patient's most recent lab result is older than the maximum 21 days allowed.).  Past Medical History:  Diagnosis Date  .  Depression   . Frequent urination   . GERD (gastroesophageal reflux disease)   . Herpes   . Hyperlipidemia   . Hypertension   . Memory loss   . Obesity   . Stroke ([place])   . Urgency of urination     Current Outpatient Medications on File Prior to Visit  Medication Sig Dispense Refill  . amLODipine (NORVASC) 5 MG tablet Take 1 tablet (5 mg total) by mouth daily. 90 tablet 3  . busPIRone (BUSPAR) 10 MG tablet Take 1 tablet (10 mg total) by mouth 3 (three) times daily. 90 tablet 2  . candesartan (ATACAND) 32 MG tablet Take 32 mg by mouth daily.    . clindamycin (CLEOCIN) 150 MG capsule as directed. Dental visits    . clopidogrel (PLAVIX) 75 MG tablet Take 1 tablet (75 mg total) by mouth daily. 30 tablet 11  . Diclofenac Sodium 3 % GEL as needed.    . furosemide (LASIX) 20 MG tablet 60 mg daily. 40mg  am, 20pm    . hydrALAZINE (APRESOLINE) 50 MG tablet Take 1 tablet (50 mg total) by mouth 2 (two) times daily. 270 tablet 3  . HYDROcodone-acetaminophen (NORCO/VICODIN) 5-325 MG tablet Take 1 tablet by mouth as needed.    . meloxicam (MOBIC) 15 MG tablet Take 15 mg by mouth daily.    . metoprolol succinate (TOPROL-XL) 25 MG 24 hr tablet Take 25 mg by mouth daily.     . ondansetron (ZOFRAN) 4 MG tablet Take 1 tablet (  4 mg total) by mouth every 8 (eight) hours as needed for nausea. 8 tablet 5  . PARoxetine (PAXIL-CR) 37.5 MG 24 hr tablet Take 1 tablet (37.5 mg total) by mouth at bedtime. 30 tablet 2  . primidone (MYSOLINE) 250 MG tablet TAKE 1 TABLET BY MOUTH TWICE A DAY 60 tablet 12  . QUEtiapine (SEROQUEL) 25 MG tablet Take 3 tablets at bedtime (75 mg) 90 tablet 2  . rosuvastatin (CRESTOR) 10 MG tablet Take 1 tablet (10 mg total) by mouth daily. 90 tablet 3  . sildenafil (VIAGRA) 50 MG tablet as needed.    Marland Kitchen spironolactone (ALDACTONE) 25 MG tablet Take 1 tablet (25 mg total) by mouth daily. 90 tablet 3  . Suvorexant (BELSOMRA) 10 MG TABS Take 10 mg by mouth at bedtime. 30 tablet 2  .  tiZANidine (ZANAFLEX) 2 MG tablet Take by mouth as needed.    . valACYclovir (VALTREX) 1000 MG tablet Take 1,000 mg by mouth as needed (herpes).     No current facility-administered medications on file prior to visit.    Allergies  Allergen Reactions  . Aspirin Other (See Comments)    Dizziness, nausea and vomiting Balance issues  . Lisinopril Other (See Comments)    Critical Cough  . Lithium Other (See Comments)    Critical Per pt reaction unknown  . Other Other (See Comments)  . Oxycodone Nausea And Vomiting  . Oxycodone Hcl Other (See Comments)  . Oxycodone-Acetaminophen Nausea And Vomiting and Other (See Comments)    Also causes dizziness  . Pork-Derived Products Other (See Comments)    Unspecified reaction     Assessment/Plan:  1. Hypertension -

## 2020-03-27 DIAGNOSIS — Z79899 Other long term (current) drug therapy: Secondary | ICD-10-CM | POA: Diagnosis not present

## 2020-03-27 DIAGNOSIS — E7800 Pure hypercholesterolemia, unspecified: Secondary | ICD-10-CM | POA: Diagnosis not present

## 2020-03-27 DIAGNOSIS — I10 Essential (primary) hypertension: Secondary | ICD-10-CM | POA: Diagnosis not present

## 2020-03-27 DIAGNOSIS — R519 Headache, unspecified: Secondary | ICD-10-CM | POA: Diagnosis not present

## 2020-03-30 DIAGNOSIS — M6281 Muscle weakness (generalized): Secondary | ICD-10-CM | POA: Diagnosis not present

## 2020-03-30 DIAGNOSIS — M25312 Other instability, left shoulder: Secondary | ICD-10-CM | POA: Diagnosis not present

## 2020-03-30 DIAGNOSIS — Z4889 Encounter for other specified surgical aftercare: Secondary | ICD-10-CM | POA: Diagnosis not present

## 2020-04-02 DIAGNOSIS — F332 Major depressive disorder, recurrent severe without psychotic features: Secondary | ICD-10-CM | POA: Diagnosis not present

## 2020-04-03 DIAGNOSIS — R519 Headache, unspecified: Secondary | ICD-10-CM | POA: Diagnosis not present

## 2020-04-03 DIAGNOSIS — R0602 Shortness of breath: Secondary | ICD-10-CM | POA: Diagnosis not present

## 2020-04-03 DIAGNOSIS — E7800 Pure hypercholesterolemia, unspecified: Secondary | ICD-10-CM | POA: Diagnosis not present

## 2020-04-03 DIAGNOSIS — I452 Bifascicular block: Secondary | ICD-10-CM | POA: Diagnosis not present

## 2020-04-03 DIAGNOSIS — I10 Essential (primary) hypertension: Secondary | ICD-10-CM | POA: Diagnosis not present

## 2020-04-03 DIAGNOSIS — Z122 Encounter for screening for malignant neoplasm of respiratory organs: Secondary | ICD-10-CM | POA: Diagnosis not present

## 2020-04-07 DIAGNOSIS — R0600 Dyspnea, unspecified: Secondary | ICD-10-CM | POA: Diagnosis not present

## 2020-04-07 DIAGNOSIS — I10 Essential (primary) hypertension: Secondary | ICD-10-CM | POA: Diagnosis not present

## 2020-04-08 DIAGNOSIS — M6281 Muscle weakness (generalized): Secondary | ICD-10-CM | POA: Diagnosis not present

## 2020-04-08 DIAGNOSIS — M25312 Other instability, left shoulder: Secondary | ICD-10-CM | POA: Diagnosis not present

## 2020-04-08 DIAGNOSIS — Z4889 Encounter for other specified surgical aftercare: Secondary | ICD-10-CM | POA: Diagnosis not present

## 2020-04-09 DIAGNOSIS — R0600 Dyspnea, unspecified: Secondary | ICD-10-CM | POA: Diagnosis not present

## 2020-04-13 DIAGNOSIS — M6281 Muscle weakness (generalized): Secondary | ICD-10-CM | POA: Diagnosis not present

## 2020-04-13 DIAGNOSIS — Z4889 Encounter for other specified surgical aftercare: Secondary | ICD-10-CM | POA: Diagnosis not present

## 2020-04-13 DIAGNOSIS — M25312 Other instability, left shoulder: Secondary | ICD-10-CM | POA: Diagnosis not present

## 2020-04-14 NOTE — Progress Notes (Signed)
Patient ID: [name]                 DOB: 1950/05/09                      MRN: [id number]     HPI: [name] is a 70 y.o. male referred by Dr. Gasper Sells to HTN clinic. PMH is significant for HTN, HFpEF (EF 60-65% on last echo 11/2019), HLD, stroke, GERD, obesity, depression. Last seen by Dr. Gasper Sells 03/16/20, BP was 150/80. Reported headache and eyes hurt and some SOB when BP elevated. Reported home BP 150/90 at baseline, 175/90 with symptoms. Endorsed stress in personal life. Hydralazine was increased from 25 mg BID to 50 mg BID.   Presented to ED 3/8 with c/o intermittent headache x3 weeks, BP was 157/94. Denied blurred vision or numbness. Patient left without being seen.   Today, patient arrives in good spirits. Reports that his BP has been improving with increased hydralazine dose. Reports the last time he checked it it was 120/70. He has a bicep cuff at home but does not check it daily, just when he feels a little off. Reports that his headaches had improved but he did have a bad headache a couple days ago. Did not check BP at that time. Explains that headaches feel like pressure in his head and eyes. Since that headache he has started checking his BP more regularly and reports readings are normal but does not have specific readings to share. Endorses some lower extremity swelling which has been going on for a while. About two weeks ago he stopped taking his second dose of furosemide (20 mg) and is now only taking 40 mg in the morning, however he says that the swelling is unchanged from before that. He stopped taking the second dose because he was running low on it and thought he was supposed to stop. He lives at an independent living facility who cooks his dinners. He says these are not very healthy or good. He eats a mid day meal at home. Amlodipine 10 mg is on the patient's past medication list, he does not remember having any intolerance with this and is unsure why it was  decreased to 5 mg.   Current HTN meds:  amlodipine 5 mg daily candesartan 32 mg daily furosemide 40 mg qAM + 20 mg qPM hydralazine 50 mg BID metoprolol succinate 25 mg daily spironolactone 25 mg daily   Previously tried: Lisinopril (cough), losartan  BP goal: <130/15mmHg  Family History: Diabetes Mellitus I in his paternal grandmother; Hypertension in his brother, father, mother, paternal grandfather, and sister.  Social History: Former smoker  Diet: No caffeine, usually does not add salt to foods Lives at independent living facility--dinner is cooked by them, says it is not healthy Midday meal: chicken salad, fruit  Exercise: Physical therapy twice a week s/p rotator cuff surgery  Home BP readings: Uses bicep cuff, does not check daily 120/70 most recently  Labs:  02/12/20: Scr 1.05, Na 140, K 3.7, Mg 1.9 12/18/19: Scr 1.09, Na 136, K 4.2, Mg 2.0  Wt Readings from Last 3 Encounters:  03/16/20 215 lb 3.2 oz (97.6 kg)  03/05/20 212 lb (96.2 kg)  02/12/20 207 lb (93.9 kg)   BP Readings from Last 3 Encounters:  03/24/20 (!) 157/94  03/16/20 (!) 150/80  03/05/20 (!) 153/97   Pulse Readings from Last 3 Encounters:  03/24/20 60  03/16/20 63  03/05/20 75  Renal function: CrCl cannot be calculated (Patient's most recent lab result is older than the maximum 21 days allowed.).  Past Medical History:  Diagnosis Date  . Depression   . Frequent urination   . GERD (gastroesophageal reflux disease)   . Herpes   . Hyperlipidemia   . Hypertension   . Memory loss   . Obesity   . Stroke ([place])   . Urgency of urination     Current Outpatient Medications on File Prior to Visit  Medication Sig Dispense Refill  . amLODipine (NORVASC) 5 MG tablet Take 1 tablet (5 mg total) by mouth daily. 90 tablet 3  . busPIRone (BUSPAR) 10 MG tablet Take 1 tablet (10 mg total) by mouth 3 (three) times daily. 90 tablet 2  . candesartan (ATACAND) 32 MG tablet Take 32 mg by mouth daily.     . clindamycin (CLEOCIN) 150 MG capsule as directed. Dental visits    . clopidogrel (PLAVIX) 75 MG tablet Take 1 tablet (75 mg total) by mouth daily. 30 tablet 11  . Diclofenac Sodium 3 % GEL as needed.    . furosemide (LASIX) 20 MG tablet 60 mg daily. 40mg  am, 20pm    . hydrALAZINE (APRESOLINE) 50 MG tablet Take 1 tablet (50 mg total) by mouth 2 (two) times daily. 270 tablet 3  . HYDROcodone-acetaminophen (NORCO/VICODIN) 5-325 MG tablet Take 1 tablet by mouth as needed.    . meloxicam (MOBIC) 15 MG tablet Take 15 mg by mouth daily.    . metoprolol succinate (TOPROL-XL) 25 MG 24 hr tablet Take 25 mg by mouth daily.     . ondansetron (ZOFRAN) 4 MG tablet Take 1 tablet (4 mg total) by mouth every 8 (eight) hours as needed for nausea. 8 tablet 5  . PARoxetine (PAXIL-CR) 37.5 MG 24 hr tablet Take 1 tablet (37.5 mg total) by mouth at bedtime. 30 tablet 2  . primidone (MYSOLINE) 250 MG tablet TAKE 1 TABLET BY MOUTH TWICE A DAY 60 tablet 12  . QUEtiapine (SEROQUEL) 25 MG tablet Take 3 tablets at bedtime (75 mg) 90 tablet 2  . rosuvastatin (CRESTOR) 10 MG tablet Take 1 tablet (10 mg total) by mouth daily. 90 tablet 3  . sildenafil (VIAGRA) 50 MG tablet as needed.    Marland Kitchen spironolactone (ALDACTONE) 25 MG tablet Take 1 tablet (25 mg total) by mouth daily. 90 tablet 3  . Suvorexant (BELSOMRA) 10 MG TABS Take 10 mg by mouth at bedtime. 30 tablet 2  . tiZANidine (ZANAFLEX) 2 MG tablet Take by mouth as needed.    . valACYclovir (VALTREX) 1000 MG tablet Take 1,000 mg by mouth as needed (herpes).     No current facility-administered medications on file prior to visit.    Allergies  Allergen Reactions  . Aspirin Other (See Comments)    Dizziness, nausea and vomiting Balance issues  . Lisinopril Other (See Comments)    Critical Cough  . Lithium Other (See Comments)    Critical Per pt reaction unknown  . Other Other (See Comments)  . Oxycodone Nausea And Vomiting  . Oxycodone Hcl Other (See Comments)   . Oxycodone-Acetaminophen Nausea And Vomiting and Other (See Comments)    Also causes dizziness  . Pork-Derived Products Other (See Comments)    Unspecified reaction     Assessment/Plan:  1. Hypertension - Blood pressure in office today is 128/78 which is at goal <130/80 mmHg. Continue current medications: amlodipine 5 mg daily, candesartan 32 mg daily, hydralazine 50 mg twice  daily, metoprolol succinate 25 mg daily, and spironolactone 25 mg daily. Will have him increase back to his previous furosemide dose of 40 mg in the morning and 20 mg in the afternoon. His weight is down 6 lbs since his last visit in February. Encouraged him to check his BP daily about 1-2 hours after taking his morning medications. Check his also when he experiences any dizziness, headache. Follow up with HTN clinic as needed.   Rebbeca Paul, PharmD PGY1 Pharmacy Resident 04/15/2020 11:43 AM

## 2020-04-15 ENCOUNTER — Other Ambulatory Visit: Payer: Self-pay

## 2020-04-15 ENCOUNTER — Ambulatory Visit (INDEPENDENT_AMBULATORY_CARE_PROVIDER_SITE_OTHER): Payer: Medicare Other | Admitting: Student-PharmD

## 2020-04-15 VITALS — BP 128/78 | HR 68 | Wt 209.4 lb

## 2020-04-15 DIAGNOSIS — Z4889 Encounter for other specified surgical aftercare: Secondary | ICD-10-CM | POA: Diagnosis not present

## 2020-04-15 DIAGNOSIS — I10 Essential (primary) hypertension: Secondary | ICD-10-CM | POA: Diagnosis not present

## 2020-04-15 DIAGNOSIS — M25312 Other instability, left shoulder: Secondary | ICD-10-CM | POA: Diagnosis not present

## 2020-04-15 DIAGNOSIS — M6281 Muscle weakness (generalized): Secondary | ICD-10-CM | POA: Diagnosis not present

## 2020-04-15 MED ORDER — FUROSEMIDE 20 MG PO TABS: ORAL_TABLET | ORAL | 3 refills | Status: DC

## 2020-04-15 NOTE — Patient Instructions (Addendum)
It was nice to see you today!  Your goal blood pressure is less than 130/80 mmHg. In clinic, your blood pressure was 128/78 mmHg.  Medication Changes: Continue amlodipine 5 mg daily, candesartan 32 mg daily, hydralazine 50 mg twice daily, metoprolol succinate 25 mg daily, and spironolactone 25 mg daily  You can resume taking furosemide 40 mg in the morning and 20 mg in the afternoon. This has been sent in to your pharmacy.   Monitor blood pressure at home daily and keep a log (on your phone or piece of paper) to bring with you to your next visit. Write down date, time, blood pressure and pulse.  Check your blood pressure daily and keep a log. If you experience dizziness or headache check it again around this time to see if your blood pressure may be contributing to it.   Keep up the good work with diet and exercise. Aim for a diet full of vegetables, fruit and lean meats (chicken, [place], fish). Try to limit salt intake by eating fresh or frozen vegetables (instead of canned), rinse canned vegetables prior to cooking and do not add any additional salt to meals.   Please give [place] a call at [phone] with any questions or concerns.

## 2020-04-16 DIAGNOSIS — R519 Headache, unspecified: Secondary | ICD-10-CM | POA: Diagnosis not present

## 2020-04-16 DIAGNOSIS — F332 Major depressive disorder, recurrent severe without psychotic features: Secondary | ICD-10-CM | POA: Diagnosis not present

## 2020-04-16 DIAGNOSIS — Z87891 Personal history of nicotine dependence: Secondary | ICD-10-CM | POA: Diagnosis not present

## 2020-04-20 DIAGNOSIS — Z4889 Encounter for other specified surgical aftercare: Secondary | ICD-10-CM | POA: Diagnosis not present

## 2020-04-20 DIAGNOSIS — M25312 Other instability, left shoulder: Secondary | ICD-10-CM | POA: Diagnosis not present

## 2020-04-20 DIAGNOSIS — M6281 Muscle weakness (generalized): Secondary | ICD-10-CM | POA: Diagnosis not present

## 2020-04-22 DIAGNOSIS — M25312 Other instability, left shoulder: Secondary | ICD-10-CM | POA: Diagnosis not present

## 2020-04-22 DIAGNOSIS — Z9889 Other specified postprocedural states: Secondary | ICD-10-CM | POA: Diagnosis not present

## 2020-04-22 DIAGNOSIS — F332 Major depressive disorder, recurrent severe without psychotic features: Secondary | ICD-10-CM | POA: Diagnosis not present

## 2020-04-22 DIAGNOSIS — M19012 Primary osteoarthritis, left shoulder: Secondary | ICD-10-CM | POA: Diagnosis not present

## 2020-04-22 DIAGNOSIS — M6281 Muscle weakness (generalized): Secondary | ICD-10-CM | POA: Diagnosis not present

## 2020-04-22 DIAGNOSIS — Z4889 Encounter for other specified surgical aftercare: Secondary | ICD-10-CM | POA: Diagnosis not present

## 2020-04-23 DIAGNOSIS — I10 Essential (primary) hypertension: Secondary | ICD-10-CM | POA: Diagnosis not present

## 2020-04-23 DIAGNOSIS — R0602 Shortness of breath: Secondary | ICD-10-CM | POA: Diagnosis not present

## 2020-04-23 DIAGNOSIS — R519 Headache, unspecified: Secondary | ICD-10-CM | POA: Diagnosis not present

## 2020-04-23 DIAGNOSIS — E7800 Pure hypercholesterolemia, unspecified: Secondary | ICD-10-CM | POA: Diagnosis not present

## 2020-04-29 DIAGNOSIS — Z4889 Encounter for other specified surgical aftercare: Secondary | ICD-10-CM | POA: Diagnosis not present

## 2020-04-29 DIAGNOSIS — M25312 Other instability, left shoulder: Secondary | ICD-10-CM | POA: Diagnosis not present

## 2020-04-29 DIAGNOSIS — M6281 Muscle weakness (generalized): Secondary | ICD-10-CM | POA: Diagnosis not present

## 2020-05-01 DIAGNOSIS — Z4889 Encounter for other specified surgical aftercare: Secondary | ICD-10-CM | POA: Diagnosis not present

## 2020-05-01 DIAGNOSIS — M25312 Other instability, left shoulder: Secondary | ICD-10-CM | POA: Diagnosis not present

## 2020-05-01 DIAGNOSIS — M6281 Muscle weakness (generalized): Secondary | ICD-10-CM | POA: Diagnosis not present

## 2020-05-06 DIAGNOSIS — F332 Major depressive disorder, recurrent severe without psychotic features: Secondary | ICD-10-CM | POA: Diagnosis not present

## 2020-05-14 DIAGNOSIS — R0600 Dyspnea, unspecified: Secondary | ICD-10-CM | POA: Diagnosis not present

## 2020-05-21 ENCOUNTER — Telehealth: Payer: Self-pay | Admitting: Internal Medicine

## 2020-05-21 NOTE — Telephone Encounter (Signed)
   [facility] Pre-operative Risk Assessment    Request for surgical clearance:  1. What type of surgery is being performed? General cleaning that may cause bleeding depending on the patient. Does the patient need to be pre-med?  2. When is this surgery scheduled? patient in the chair now.    3. What type of clearance is required (medical clearance vs. Pharmacy clearance to hold med vs. Both)? both  4. Are there any medications that need to be held prior to surgery and how long? unsure  5. Practice name and name of physician performing surgery? [facility]   6. What is your office phone number [phone]   7.   What is your office fax number   8.   Anesthesia type (None, local, MAC, general) ? Unsure, may need to be used.   Milbert Coulter 05/21/2020, 3:19 PM  _________________________________________________________________   (provider comments below)

## 2020-05-21 NOTE — Telephone Encounter (Signed)
Spoke with Dr. Gilford Rile.  Read echo report for patient's mitral valve.  No premed needed.  Dr. Gilford Rile thanked nurse for assistance.

## 2020-06-02 ENCOUNTER — Ambulatory Visit (HOSPITAL_COMMUNITY): Payer: Medicare Other | Admitting: Psychiatry

## 2020-06-03 DIAGNOSIS — M67912 Unspecified disorder of synovium and tendon, left shoulder: Secondary | ICD-10-CM | POA: Diagnosis not present

## 2020-06-04 ENCOUNTER — Ambulatory Visit (INDEPENDENT_AMBULATORY_CARE_PROVIDER_SITE_OTHER): Payer: Medicare Other | Admitting: Neurology

## 2020-06-04 ENCOUNTER — Encounter: Payer: Self-pay | Admitting: Neurology

## 2020-06-04 VITALS — BP 149/84 | HR 67 | Ht 67.0 in | Wt 212.8 lb

## 2020-06-04 DIAGNOSIS — Z8673 Personal history of transient ischemic attack (TIA), and cerebral infarction without residual deficits: Secondary | ICD-10-CM | POA: Diagnosis not present

## 2020-06-04 DIAGNOSIS — I6781 Acute cerebrovascular insufficiency: Secondary | ICD-10-CM | POA: Diagnosis not present

## 2020-06-04 DIAGNOSIS — G441 Vascular headache, not elsewhere classified: Secondary | ICD-10-CM | POA: Diagnosis not present

## 2020-06-04 DIAGNOSIS — E0942 Drug or chemical induced diabetes mellitus with neurological complications with diabetic polyneuropathy: Secondary | ICD-10-CM

## 2020-06-04 NOTE — Progress Notes (Signed)
[facility] [address] [place]. [place] [address] [phone]       OFFICE CONSULT NOTE  [name] Date of Birth:  1950-05-16 Medical Record Number:  [id number]   Referring MD: Christella Noa  Reason for Referral: Headaches  HPI: [name] is a 70 year old African-American male seen today for consultation visit for headaches.  History is obtained from the patient and review of referral notes and I personally reviewed electronic medical records and pertinent imaging films in PACS.  He has past medical history of hypertension, hyperlipidemia, obesity, right brain subcortical infarct in 2011.  He states over 4 months ago he developed new onset of headaches which were mostly nocturnal.  He would wake up from sleep with a severe headache which he describes as bitemporal and spreading into the retro-orbital regions severe 10/10 in intensity with burning and at times throbbing quality.  There was accompanying nausea and he occasionally vomited.  He denied any light or sound sensitivity.  Headache was not relieved by sleeping as he could not sleep.  It would last for hours.  The headache initially occurred on a daily basis for several weeks.  He saw his primary physician who gave him Tylenol with codeine which did not work.  Finally he saw Dr. Kristie Cowman who prescribed gabapentin on 04/03/2020 and he has been taking it 1 tablet 3 times daily and the headaches seem to have gone within a few days of starting it.  He however seems to have gained weight particularly on his belly and wants to lose it.  He denied any accompanying symptoms in the form of blurred vision, loss of vision, jaw claudication, myalgias or scalp tenderness.  He does have remote history of occasional migraines in his 30s when he had accompanying light and sound sensitivity as well.  He felt the current headaches were different.  The patient has recently gained weight he does snore but he has never been tested  for sleep apnea.  Patient has lifelong history of mild action tremor tremor involving mostly left upper extremity felt to be related to anxiety which appears stable.  He history of right brain subcortical infarct due to small vessel disease in December 2011 with only minor residual deficits on the left.  He states he has been on Plavix ever since and not missed doses no recurrent stroke or TIA symptoms for more than 10 years.  He states his blood pressure and cholesterol has been under good control but he cannot tell me when the last time it was checked.  He was last seen in our office for follow-up by [facility] nurse practitioner on 01/09/2020.  ROS:   14 system review of systems is positive for headache, nausea, snoring and all other systems negative  PMH:  Past Medical History:  Diagnosis Date  . Depression   . Frequent urination   . GERD (gastroesophageal reflux disease)   . Herpes   . Hyperlipidemia   . Hypertension   . Memory loss   . Obesity   . Stroke ([place])   . Urgency of urination     Social History:  Social History   Socioeconomic History  . Marital status: Legally Separated    Spouse name: Not on file  . Number of children: 1  . Years of education: Masters  . Highest education level: Not on file  Occupational History  . Occupation: retired    Fish farm manager: RETIRED  Tobacco Use  . Smoking status: Former Research scientist (life sciences)  .  Smokeless tobacco: Never Used  Substance and Sexual Activity  . Alcohol use: No    Alcohol/week: 1.0 standard drink    Types: 1 Cans of beer per week    Comment: socially  . Drug use: No  . Sexual activity: Never  Other Topics Concern  . Not on file  Social History Narrative   Patient lives in [place] living @ [facility]   Patient is right-handed.   Patient does not drink any caffeine.   Social Determinants of Health   Financial Resource Strain: Not on file  Food Insecurity: Not on file  Transportation Needs: Not on file  Physical  Activity: Not on file  Stress: Not on file  Social Connections: Not on file  Intimate Partner Violence: Not on file    Medications:   Current Outpatient Medications on File Prior to Visit  Medication Sig Dispense Refill  . amLODipine (NORVASC) 5 MG tablet Take 1 tablet (5 mg total) by mouth daily. 90 tablet 3  . busPIRone (BUSPAR) 10 MG tablet Take 1 tablet (10 mg total) by mouth 3 (three) times daily. 90 tablet 2  . candesartan (ATACAND) 32 MG tablet Take 32 mg by mouth daily.    . clindamycin (CLEOCIN) 150 MG capsule as directed. Dental visits    . clopidogrel (PLAVIX) 75 MG tablet Take 1 tablet (75 mg total) by mouth daily. 30 tablet 11  . Diclofenac Sodium 3 % GEL as needed.    . furosemide (LASIX) 20 MG tablet Take furosemide 40 mg in the morning and 20 mg in the afternoon 270 tablet 3  . hydrALAZINE (APRESOLINE) 50 MG tablet Take 1 tablet (50 mg total) by mouth 2 (two) times daily. 270 tablet 3  . HYDROcodone-acetaminophen (NORCO/VICODIN) 5-325 MG tablet Take 1 tablet by mouth as needed.    . meloxicam (MOBIC) 15 MG tablet Take 15 mg by mouth daily.    . metoprolol succinate (TOPROL-XL) 25 MG 24 hr tablet Take 25 mg by mouth daily.     . ondansetron (ZOFRAN) 4 MG tablet Take 1 tablet (4 mg total) by mouth every 8 (eight) hours as needed for nausea. 8 tablet 5  . PARoxetine (PAXIL-CR) 37.5 MG 24 hr tablet Take 1 tablet (37.5 mg total) by mouth at bedtime. 30 tablet 2  . primidone (MYSOLINE) 250 MG tablet TAKE 1 TABLET BY MOUTH TWICE A DAY 60 tablet 12  . QUEtiapine (SEROQUEL) 25 MG tablet Take 3 tablets at bedtime (75 mg) 90 tablet 2  . rosuvastatin (CRESTOR) 10 MG tablet Take 1 tablet (10 mg total) by mouth daily. 90 tablet 3  . sildenafil (VIAGRA) 50 MG tablet 100 mg as needed.    Marland Kitchen spironolactone (ALDACTONE) 25 MG tablet Take 1 tablet (25 mg total) by mouth daily. 90 tablet 3  . Suvorexant (BELSOMRA) 10 MG TABS Take 10 mg by mouth at bedtime. 30 tablet 2  . tiZANidine (ZANAFLEX) 2  MG tablet Take by mouth as needed.    . valACYclovir (VALTREX) 1000 MG tablet Take 1,000 mg by mouth as needed (herpes).     No current facility-administered medications on file prior to visit.    Allergies:   Allergies  Allergen Reactions  . Aspirin Other (See Comments)    Dizziness, nausea and vomiting Balance issues  . Lisinopril Other (See Comments)    Critical Cough  . Lithium Other (See Comments)    Critical Per pt reaction unknown  . Other Other (See Comments)  . Oxycodone Nausea  And Vomiting  . Oxycodone Hcl Other (See Comments)  . Oxycodone-Acetaminophen Nausea And Vomiting and Other (See Comments)    Also causes dizziness  . Pork-Derived Products Other (See Comments)    Unspecified reaction    Physical Exam General: Obese middle-aged African-American male, seated, in no evident distress Head: head normocephalic and atraumatic.   Neck: supple with no carotid or supraclavicular bruits Cardiovascular: regular rate and rhythm, no murmurs Musculoskeletal: no deformity Skin:  no rash/petichiae Vascular:  Normal pulses all extremities  Neurologic Exam Mental Status: Awake and fully alert. Oriented to place and time. Recent and remote memory intact. Attention span, concentration and fund of knowledge appropriate. Mood and affect appropriate.  Cranial Nerves: Fundoscopic exam reveals sharp disc margins. Pupils equal, briskly reactive to light. Extraocular movements full without nystagmus. Visual fields full to confrontation. Hearing intact. Facial sensation intact. Face, tongue, palate moves normally and symmetrically.  Motor: Normal bulk and tone. Normal strength in all tested extremity muscles.  Mild left grip weakness.  Diminished fine finger movements on the left.  Orbits right over left upper extremity. Sensory.: intact to touch , pinprick , position and vibratory sensation.  Coordination: Rapid alternating movements normal in all extremities. Finger-to-nose and  heel-to-shin performed accurately bilaterally. Gait and Station: Arises from chair without difficulty. Stance is normal. Gait demonstrates normal stride length and balance . Able to heel, toe and tandem walk with mild difficulty.  Reflexes: 1+ and symmetric. Toes downgoing.   NIHSS 0 Modified Rankin  1   ASSESSMENT: 70 year old African-American male with new onset daily headaches of unclear etiology which seems to have responded to gabapentin but he seems to have gained a lot of weight on it..  Remote history of right brain subcortical infarct in 2011 with multiple vascular risk factors of obesity, hypertension hyperlipidemia and suspected sleep apnea     PLAN: I had a long discussion with the patient regarding his new onset of daily headaches which are of unclear etiology but seems to have responded to gabapentin.  I recommend further evaluation with checking MRI scan of the brain with and without contrast and MRA of the brain and neck, ESR, lipid profile hemoglobin A1c.  I recommend he reduce the dose of gabapentin to 300 mg twice daily since he is gaining weight and his headaches free.  Recommend check polysomnogram for sleep apnea as he appears to be at risk because of his obesity and snoring.  Continue Plavix for secondary stroke prevention and maintain aggressive risk factor modification with strict control of hypertension with blood pressure goal below 140/90, lipids with LDL cholesterol goal below 70 mg percent and diabetes with hemoglobin A1c goal below 6.5%.  I encouraged him to eat a healthy diet with lots of fruits, vegetables, cereals and whole grains and to be active and lose weight.  He will return for follow-up in the future in 2 months or call earlier if necessary.  Greater than 50% time during this 45-minute consultation visit were spent in counseling and coordination of care about his new onset headaches as well as remote history of stroke and discussion about stroke prevention and  answering questions Antony Contras, MD Note: This document was prepared with digital dictation and possible smart phrase technology. Any transcriptional errors that result from this process are unintentional.

## 2020-06-04 NOTE — Patient Instructions (Signed)
I had a long discussion with the patient regarding his new onset of daily headaches which are of unclear etiology but seems to have responded to gabapentin.  I recommend further evaluation with checking MRI scan of the brain with and without contrast and MRA of the brain and neck, ESR, lipid profile hemoglobin A1c.  I recommend he reduce the dose of gabapentin to 300 mg twice daily since he is gaining weight and his headaches free.  Recommend check polysomnogram for sleep apnea as he appears to be at risk because of his obesity and snoring.  Continue Plavix for secondary stroke prevention and maintain aggressive risk factor modification with strict control of hypertension with blood pressure goal below 140/90, lipids with LDL cholesterol goal below 70 mg percent and diabetes with hemoglobin A1c goal below 6.5%.  I encouraged him to eat a healthy diet with lots of fruits, vegetables, cereals and whole grains and to be active and lose weight.  He will return for follow-up in the future in 2 months or call earlier if necessary.

## 2020-06-05 LAB — LIPID PANEL
Chol/HDL Ratio: 4 ratio (ref 0.0–5.0)
Cholesterol, Total: 140 mg/dL (ref 100–199)
HDL: 35 mg/dL — ABNORMAL LOW (ref >39)
LDL Chol Calc (NIH): 85 mg/dL (ref 0–99)
Triglycerides: 106 mg/dL (ref 0–149)
VLDL Cholesterol Cal: 20 mg/dL (ref 5–40)

## 2020-06-05 LAB — SEDIMENTATION RATE: Sed Rate: 2 mm/hr (ref 0–30)

## 2020-06-05 LAB — HEMOGLOBIN A1C
Est. average glucose Bld gHb Est-mCnc: 123 mg/dL
Hgb A1c MFr Bld: 5.9 % — ABNORMAL HIGH (ref 4.8–5.6)

## 2020-06-05 NOTE — Progress Notes (Signed)
Kindly inform the patient that cholesterol profile is borderline but acceptable.  Screening test for diabetes and sedimentation rate were both satisfactory as well.

## 2020-06-11 ENCOUNTER — Telehealth: Payer: Self-pay | Admitting: Emergency Medicine

## 2020-06-11 DIAGNOSIS — R0602 Shortness of breath: Secondary | ICD-10-CM | POA: Diagnosis not present

## 2020-06-11 DIAGNOSIS — F17210 Nicotine dependence, cigarettes, uncomplicated: Secondary | ICD-10-CM | POA: Diagnosis not present

## 2020-06-11 DIAGNOSIS — Z87891 Personal history of nicotine dependence: Secondary | ICD-10-CM | POA: Diagnosis not present

## 2020-06-11 DIAGNOSIS — R918 Other nonspecific abnormal finding of lung field: Secondary | ICD-10-CM | POA: Diagnosis not present

## 2020-06-11 NOTE — Telephone Encounter (Signed)
-----   Message from Garvin Fila, MD sent at 06/05/2020  8:54 AM EDT ----- Mitchell Heir inform the patient that cholesterol profile is borderline but acceptable.  Screening test for diabetes and sedimentation rate were both satisfactory as well.

## 2020-06-18 DIAGNOSIS — R0602 Shortness of breath: Secondary | ICD-10-CM | POA: Diagnosis not present

## 2020-06-18 DIAGNOSIS — E7800 Pure hypercholesterolemia, unspecified: Secondary | ICD-10-CM | POA: Diagnosis not present

## 2020-06-18 DIAGNOSIS — R519 Headache, unspecified: Secondary | ICD-10-CM | POA: Diagnosis not present

## 2020-06-18 DIAGNOSIS — I10 Essential (primary) hypertension: Secondary | ICD-10-CM | POA: Diagnosis not present

## 2020-06-18 DIAGNOSIS — F17210 Nicotine dependence, cigarettes, uncomplicated: Secondary | ICD-10-CM | POA: Diagnosis not present

## 2020-06-19 ENCOUNTER — Other Ambulatory Visit: Payer: Self-pay

## 2020-06-19 ENCOUNTER — Ambulatory Visit
Admission: RE | Admit: 2020-06-19 | Discharge: 2020-06-19 | Disposition: A | Discharge status: Home / Self Care | Payer: Medicare Other | Source: Ambulatory Visit | Attending: Neurology | Admitting: Neurology

## 2020-06-19 ENCOUNTER — Telehealth (HOSPITAL_COMMUNITY): Payer: Self-pay

## 2020-06-19 DIAGNOSIS — Z8673 Personal history of transient ischemic attack (TIA), and cerebral infarction without residual deficits: Secondary | ICD-10-CM | POA: Diagnosis not present

## 2020-06-19 DIAGNOSIS — G441 Vascular headache, not elsewhere classified: Secondary | ICD-10-CM | POA: Diagnosis not present

## 2020-06-19 MED ORDER — GADOBENATE DIMEGLUMINE 529 MG/ML IV SOLN
20.0000 mL | Freq: Once | INTRAVENOUS | Status: AC | PRN
  Administered 2020-06-19: 20 mL via INTRAVENOUS

## 2020-06-19 NOTE — Telephone Encounter (Signed)
error 

## 2020-06-22 NOTE — Progress Notes (Signed)
Kindly inform the patient that MRI scan of the brain shows changes of hardening of the arteries in the brain.  No definite stroke or any worrisome finding.  Overall no major changes compared with previous MRI from 2016

## 2020-06-23 ENCOUNTER — Telehealth: Payer: Self-pay | Admitting: Emergency Medicine

## 2020-06-23 NOTE — Telephone Encounter (Signed)
-----   Message from Garvin Fila, MD sent at 06/22/2020  5:03 PM EDT ----- [name] inform the patient that MRI scan of the brain shows changes of hardening of the arteries in the brain.  No definite stroke or any worrisome finding.  Overall no major changes compared with previous MRI from 2016

## 2020-06-23 NOTE — Telephone Encounter (Signed)
Called patient and discussed Dr. Clydene Fake review and findings of his MRI scan of the brain.  Patient denied further questions, verbalized understanding and expressed appreciation for the phone call.

## 2020-06-25 ENCOUNTER — Other Ambulatory Visit: Payer: Self-pay

## 2020-06-25 ENCOUNTER — Ambulatory Visit (INDEPENDENT_AMBULATORY_CARE_PROVIDER_SITE_OTHER): Payer: Medicare Other | Admitting: Psychiatry

## 2020-06-25 ENCOUNTER — Encounter (HOSPITAL_COMMUNITY): Payer: Self-pay | Admitting: Psychiatry

## 2020-06-25 VITALS — BP 133/84 | HR 61 | Temp 98.3°F | Ht 67.0 in | Wt 210.6 lb

## 2020-06-25 DIAGNOSIS — F419 Anxiety disorder, unspecified: Secondary | ICD-10-CM | POA: Diagnosis not present

## 2020-06-25 DIAGNOSIS — F3342 Major depressive disorder, recurrent, in full remission: Secondary | ICD-10-CM

## 2020-06-25 DIAGNOSIS — Z20828 Contact with and (suspected) exposure to other viral communicable diseases: Secondary | ICD-10-CM | POA: Diagnosis not present

## 2020-06-25 MED ORDER — TRAZODONE HCL 150 MG PO TABS: 150.0000 mg | ORAL_TABLET | Freq: Every evening | ORAL | 2 refills | Status: DC | PRN

## 2020-06-25 MED ORDER — BELSOMRA 10 MG PO TABS: 10.0000 mg | ORAL_TABLET | Freq: Every day | ORAL | 2 refills | Status: DC

## 2020-06-25 MED ORDER — QUETIAPINE FUMARATE 25 MG PO TABS
ORAL_TABLET | ORAL | 2 refills | Status: DC
Start: 2020-06-25 — End: 2020-07-01

## 2020-06-25 MED ORDER — BUSPIRONE HCL 10 MG PO TABS: ORAL_TABLET | ORAL | 2 refills | Status: DC

## 2020-06-25 MED ORDER — PAROXETINE HCL ER 37.5 MG PO TB24: 37.5000 mg | ORAL_TABLET | Freq: Every day | ORAL | 2 refills | Status: DC

## 2020-06-25 NOTE — Progress Notes (Signed)
[facility] MD OP Progress Note    06/25/2020 10:30 AM [name]  MRN:  [id number]  Chief Complaint: " I am doing well."   HPI: Patient reported he is doing well.  He was noted to be wearing casual clothes and seems very comfortable and pleasant.  He stated that despite the pain in his left shoulder he has continued to play golf.  He stated that the is enjoying his retirement life.  He stated that his relationship with his girlfriend is going well although she continues to get mad at him.  He stated that he has told her clearly that he really loves her and she should not be jealous about anyone else. He mentioned that he has been able to see his girlfriend's great-granddaughter and she has good relationship with her family. He also informed that his daughter is doing well and he just saw her yesterday. He stated that everything is going very well for him and is happy with all the progress he is made in the last 1 year. He mentioned that on 1 occasion he tried his girlfriend's trazodone, she gave him 3 tablets of 50 mg strength and he slept really well after that.  He asked if he could try that again.  He has taken in the past but was not too pleased with the results but is willing to try it again now. He also stated that he takes 2 tablets of BuSpar in the morning and then also sometimes takes 2 tabs in the evening.  Writer asked him if he would like to get the dose adjusted to 20 mg twice a day and he agreed to that. Writer informed Careers information officer to send prescriptions for belsomra as well and he can try taking trazodone as needed along with that. He stated that he was sad to hear as Probation officer was leaving and wished the writer the best.   Visit Diagnosis:    ICD-10-CM   1. Recurrent major depressive disorder, in full remission ([place])  F33.42 QUEtiapine (SEROQUEL) 25 MG tablet    PARoxetine (PAXIL-CR) 37.5 MG 24 hr tablet    traZODone (DESYREL) 150 MG tablet    Suvorexant (BELSOMRA) 10 MG TABS    2.  Anxiety  F41.9 busPIRone (BUSPAR) 10 MG tablet      Past Psychiatric History: MDD, TD, anxiety  Past Medical History:  Past Medical History:  Diagnosis Date   Depression    Frequent urination    GERD (gastroesophageal reflux disease)    Herpes    Hyperlipidemia    Hypertension    Memory loss    Obesity    Stroke [facility])    Urgency of urination     Past Surgical History:  Procedure Laterality Date   CHOLECYSTECTOMY N/A 12/17/2019   Procedure: LAPAROSCOPIC CHOLECYSTECTOMY WITH ICG INJECTION;  Surgeon: Rolm Bookbinder, MD;  Location: [facility];  Service: General;  Laterality: N/A;   disckec     SPINAL FUSION     VASECTOMY        Family History:  Family History  Problem Relation Age of Onset   Hypertension Mother    Hypertension Father    Hypertension Sister    Hypertension Brother    Cancer Maternal Aunt    Cancer - Ovarian Maternal Grandmother    Diabetes Mellitus I Paternal Grandmother    Hypertension Paternal Grandfather    Cancer Maternal Aunt     Social History:  Social History   Socioeconomic History   Marital  status: Legally Separated    Spouse name: Not on file   Number of children: 1   Years of education: Masters   Highest education level: Not on file  Occupational History   Occupation: retired    Fish farm manager: RETIRED  Tobacco Use   Smoking status: Former    Pack years: 0.00   Smokeless tobacco: Never  Substance and Sexual Activity   Alcohol use: No    Alcohol/week: 1.0 standard drink    Types: 1 Cans of beer per week    Comment: socially   Drug use: No   Sexual activity: Never  Other Topics Concern   Not on file  Social History Narrative   Patient lives in [facility] living @ [facility]   Patient is right-handed.   Patient does not drink any caffeine.   Social Determinants of Health   Financial Resource Strain: Not on file  Food Insecurity: Not on file  Transportation Needs: Not on file  Physical Activity: Not on file  Stress:  Not on file  Social Connections: Not on file    Allergies:  Allergies  Allergen Reactions   Aspirin Other (See Comments)    Dizziness, nausea and vomiting Balance issues   Lisinopril Other (See Comments)    Critical Cough   Lithium Other (See Comments)    Critical Per pt reaction unknown   Other Other (See Comments)   Oxycodone Nausea And Vomiting   Oxycodone Hcl Other (See Comments)   Oxycodone-Acetaminophen Nausea And Vomiting and Other (See Comments)    Also causes dizziness   Pork-Derived Products Other (See Comments)    Unspecified reaction    Metabolic Disorder Labs: Lab Results  Component Value Date   HGBA1C 5.9 (H) 06/04/2020   MPG 114.02 12/17/2019   MPG 120 (H) 12/20/2009   No results found for: PROLACTIN Lab Results  Component Value Date   CHOL 140 06/04/2020   TRIG 106 06/04/2020   HDL 35 (L) 06/04/2020   CHOLHDL 4.0 06/04/2020   VLDL 36 12/17/2019   LDLCALC 85 06/04/2020   LDLCALC 109 (H) 12/17/2019   Lab Results  Component Value Date   TSH 2.605 12/15/2019   TSH 2.850 10/16/2013    Therapeutic Level Labs: No results found for: LITHIUM No results found for: VALPROATE No components found for:  CBMZ  Current Medications: Current Outpatient Medications  Medication Sig Dispense Refill   busPIRone (BUSPAR) 10 MG tablet Take 2 tablets (20 mg) twice daily 120 tablet 2   traZODone (DESYREL) 150 MG tablet Take 1 tablet (150 mg total) by mouth at bedtime as needed for sleep. 30 tablet 2   amLODipine (NORVASC) 5 MG tablet Take 1 tablet (5 mg total) by mouth daily. 90 tablet 3   candesartan (ATACAND) 32 MG tablet Take 32 mg by mouth daily.     clindamycin (CLEOCIN) 150 MG capsule as directed. Dental visits     clopidogrel (PLAVIX) 75 MG tablet Take 1 tablet (75 mg total) by mouth daily. 30 tablet 11   Diclofenac Sodium 3 % GEL as needed.     furosemide (LASIX) 20 MG tablet Take furosemide 40 mg in the morning and 20 mg in the afternoon 270 tablet 3    hydrALAZINE (APRESOLINE) 50 MG tablet Take 1 tablet (50 mg total) by mouth 2 (two) times daily. 270 tablet 3   meloxicam (MOBIC) 15 MG tablet Take 15 mg by mouth daily.     metoprolol succinate (TOPROL-XL) 25 MG 24 hr tablet Take  25 mg by mouth daily.      ondansetron (ZOFRAN) 4 MG tablet Take 1 tablet (4 mg total) by mouth every 8 (eight) hours as needed for nausea. 8 tablet 5   PARoxetine (PAXIL-CR) 37.5 MG 24 hr tablet Take 1 tablet (37.5 mg total) by mouth at bedtime. 30 tablet 2   primidone (MYSOLINE) 250 MG tablet TAKE 1 TABLET BY MOUTH TWICE A DAY 60 tablet 12   QUEtiapine (SEROQUEL) 25 MG tablet Take 3 tablets at bedtime (75 mg) 90 tablet 2   rosuvastatin (CRESTOR) 10 MG tablet Take 1 tablet (10 mg total) by mouth daily. 90 tablet 3   sildenafil (VIAGRA) 50 MG tablet 100 mg as needed.     spironolactone (ALDACTONE) 25 MG tablet Take 1 tablet (25 mg total) by mouth daily. 90 tablet 3   Suvorexant (BELSOMRA) 10 MG TABS Take 10 mg by mouth at bedtime. 30 tablet 2   tiZANidine (ZANAFLEX) 2 MG tablet Take by mouth as needed.     valACYclovir (VALTREX) 1000 MG tablet Take 1,000 mg by mouth as needed (herpes).     No current facility-administered medications for this visit.     Musculoskeletal: Strength & Muscle Tone: unable to assess due to telemed visit Gait & Station: unable to assess due to telemed visit Patient leans: unable to assess due to telemed visit  Psychiatric Specialty Exam: Review of Systems  There were no vitals taken for this visit.There is no height or weight on file to calculate BMI.  General Appearance: Well Groomed  Eye Contact:  Good  Speech:  Clear and Coherent and Normal Rate  Volume:  Normal  Mood:  Euthymic  Affect: congruent  Thought Process:  Goal Directed and Descriptions of Associations: Intact  Orientation:  Full (Time, Place, and Person)  Thought Content: Logical   Suicidal Thoughts:  No  Homicidal Thoughts:  No  Memory:  Recent;   Good    Judgement:  Fair  Insight:  Fair  Psychomotor Activity: normal  Concentration:  Concentration: Good and Attention Span: Good  Recall:  Good  Fund of Knowledge: Good  Language: Good  Akathisia:  Negative  Handed:  Right  AIMS (if indicated): 0  Assets:  Communication Skills Desire for Improvement Financial Resources/Insurance Housing  ADL's:  Intact  Cognition: WNL  Sleep:  Good, improved   Screenings: AIMS    Flowsheet Row Admission (Discharged) from OP Visit from 02/02/2019 in [facility] Total Score 12      AUDIT    [facility] Admission (Discharged) from OP Visit from 02/02/2019 in [facility]  Alcohol Use Disorder Identification Test Final Score (AUDIT) 0      Mini-Mental    Flowsheet Row Office Visit from 01/09/2020 in [facility] Office Visit from 03/25/2019 in [facility] Office Visit from 09/12/2018 in [facility] Office Visit from 03/12/2018 in [facility]  Total Score (max 30 points ) 27 24 24 28 28       Flowsheet Row ED from 03/24/2020 in [facility] DEPT Admission (Discharged) from OP Visit from 02/02/2019 in [facility] No Risk Error: Question 1 not populated        Assessment and Plan: Patient appears to be stable on his current regimen.  His relationship with his girlfriend and the residential facility is going well.  He is enjoying playing  golf.  1. Recurrent major depressive disorder, in full remission (HCC)  - QUEtiapine (SEROQUEL) 25 MG tablet; Take 3 tablets at bedtime (75 mg)  Dispense: 90 tablet; Refill: 2 - PARoxetine (PAXIL-CR) 37.5 MG 24 hr tablet; Take 1 tablet (37.5 mg total) by mouth at bedtime.  Dispense: 30 tablet; Refill: 2 -Restart traZODone (DESYREL) 150 MG tablet; Take 1  tablet (150 mg total) by mouth at bedtime as needed for sleep.  Dispense: 30 tablet; Refill: 2 - Suvorexant (BELSOMRA) 10 MG TABS; Take 10 mg by mouth at bedtime.  Dispense: 30 tablet; Refill: 2  2. Anxiety  -Adjust busPIRone (BUSPAR) 10 MG tablet; Take 2 tablets (20 mg) twice daily  Dispense: 120 tablet; Refill: 2  Follow-up in 3 months. Patient is aware that writer is leaving and will be seeing a different provider at the time of his next visit.    [place] Crane, MD 06/25/2020, 10:30 AM

## 2020-06-26 ENCOUNTER — Inpatient Hospital Stay: Admission: RE | Admit: 2020-06-26 | Payer: Medicare Other | Source: Ambulatory Visit

## 2020-06-26 ENCOUNTER — Other Ambulatory Visit: Payer: Medicare Other

## 2020-07-01 ENCOUNTER — Encounter: Payer: Self-pay | Admitting: Internal Medicine

## 2020-07-01 ENCOUNTER — Ambulatory Visit (INDEPENDENT_AMBULATORY_CARE_PROVIDER_SITE_OTHER): Payer: Medicare Other | Admitting: Internal Medicine

## 2020-07-01 ENCOUNTER — Other Ambulatory Visit: Payer: Medicare Other

## 2020-07-01 ENCOUNTER — Other Ambulatory Visit: Payer: Self-pay

## 2020-07-01 ENCOUNTER — Other Ambulatory Visit: Payer: Self-pay | Admitting: *Deleted

## 2020-07-01 VITALS — BP 100/60 | HR 69 | Ht 67.0 in | Wt 217.0 lb

## 2020-07-01 DIAGNOSIS — I10 Essential (primary) hypertension: Secondary | ICD-10-CM

## 2020-07-01 DIAGNOSIS — E785 Hyperlipidemia, unspecified: Secondary | ICD-10-CM | POA: Diagnosis not present

## 2020-07-01 DIAGNOSIS — I5032 Chronic diastolic (congestive) heart failure: Secondary | ICD-10-CM | POA: Diagnosis not present

## 2020-07-01 LAB — BASIC METABOLIC PANEL
BUN/Creatinine Ratio: 8 — ABNORMAL LOW (ref 10–24)
BUN: 13 mg/dL (ref 8–27)
CO2: 24 mmol/L (ref 20–29)
Calcium: 9.4 mg/dL (ref 8.6–10.2)
Chloride: 100 mmol/L (ref 96–106)
Creatinine, Ser: 1.72 mg/dL — ABNORMAL HIGH (ref 0.76–1.27)
Glucose: 92 mg/dL (ref 65–99)
Potassium: 4.4 mmol/L (ref 3.5–5.2)
Sodium: 137 mmol/L (ref 134–144)
eGFR: 43 mL/min/{1.73_m2} — ABNORMAL LOW (ref >59)

## 2020-07-01 LAB — MAGNESIUM: Magnesium: 1.8 mg/dL (ref 1.6–2.3)

## 2020-07-01 MED ORDER — FUROSEMIDE 40 MG PO TABS: 40.0000 mg | ORAL_TABLET | Freq: Two times a day (BID) | ORAL | 3 refills | Status: DC

## 2020-07-01 NOTE — Progress Notes (Signed)
Cardiology Office Note:    Date:  07/01/2020   ID:  [name], DOB Jun 27, 1950, MRN [id number]  PCP:  Kristie Cowman, MD  [facility] Cardiologist:  Werner Lean, MD  [place] Electrophysiologist:  None   CC: Follow up BP and HLD  History of Present Illness:    [name] is a 70 y.o. male with a hx of HTN, HLD, Stroke NOS, MCD NOS, who presented for evaluation 01/01/21.  Since prior has been on spironolactone and lasix 40 mg PO Daily.  Did not get labs afterwards.  Last seen 02/12/20.  In interim had DOD visit increase in medication,   In interim of this visit, patient had worsening headache and had start on amlodipine and hydralazine.  In interim of this visit, patient had further increase in his hydralazine with [facility].  Patient notes that he is doing OK.  Since last visit notes that he is feeling very tired.  Relevant interval testing or therapy include increase in hydralazine.  There are no interval hospital/ED visit.    No chest pain or pressure .  Notes that he had a hard time breathing sometimes; can put his finger on when.  Notes DOE after the first three holes of golf.   No palpitations or syncope.  Has frequent hand and shoulder pain (bilateral) and takes significant amounts of ibuprofen (4 pills sometimes).  Ambulatory blood pressure not done anymore.  Lives in an independent living facilty   Past Medical History:  Diagnosis Date   Depression    Frequent urination    GERD (gastroesophageal reflux disease)    Herpes    Hyperlipidemia    Hypertension    Memory loss    Obesity    Stroke [facility])    Urgency of urination     Past Surgical History:  Procedure Laterality Date   CHOLECYSTECTOMY N/A 12/17/2019   Procedure: LAPAROSCOPIC CHOLECYSTECTOMY WITH ICG INJECTION;  Surgeon: Rolm Bookbinder, MD;  Location: [facility];  Service: General;  Laterality: N/A;   disckec     SPINAL FUSION     VASECTOMY      Current Medications: Current Meds   Medication Sig   amLODipine (NORVASC) 5 MG tablet Take 1 tablet (5 mg total) by mouth daily.   busPIRone (BUSPAR) 10 MG tablet Take 2 tablets (20 mg) twice daily   candesartan (ATACAND) 32 MG tablet Take 32 mg by mouth daily.   clopidogrel (PLAVIX) 75 MG tablet Take 1 tablet (75 mg total) by mouth daily.   Diclofenac Sodium 3 % GEL as needed.   furosemide (LASIX) 40 MG tablet Take 1 tablet (40 mg total) by mouth 2 (two) times daily.   gabapentin (NEURONTIN) 400 MG capsule Take 400 mg by mouth in the morning and at bedtime.   hydrALAZINE (APRESOLINE) 50 MG tablet Take 1 tablet (50 mg total) by mouth 2 (two) times daily.   meloxicam (MOBIC) 15 MG tablet Take 15 mg by mouth daily.   metoprolol succinate (TOPROL-XL) 25 MG 24 hr tablet Take 25 mg by mouth daily.    ondansetron (ZOFRAN) 4 MG tablet Take 1 tablet (4 mg total) by mouth every 8 (eight) hours as needed for nausea.   PARoxetine (PAXIL-CR) 37.5 MG 24 hr tablet Take 1 tablet (37.5 mg total) by mouth at bedtime.   primidone (MYSOLINE) 250 MG tablet TAKE 1 TABLET BY MOUTH TWICE A DAY   rosuvastatin (CRESTOR) 10 MG tablet Take 1 tablet (10 mg total) by  mouth daily.   sildenafil (VIAGRA) 50 MG tablet 100 mg as needed.   traZODone (DESYREL) 150 MG tablet Take 1 tablet (150 mg total) by mouth at bedtime as needed for sleep.   valACYclovir (VALTREX) 1000 MG tablet Take 1,000 mg by mouth as needed (herpes).   [DISCONTINUED] furosemide (LASIX) 20 MG tablet Take furosemide 40 mg in the morning and 20 mg in the afternoon   [DISCONTINUED] spironolactone (ALDACTONE) 25 MG tablet Take 1 tablet (25 mg total) by mouth daily.   [DISCONTINUED] Suvorexant (BELSOMRA) 10 MG TABS Take 10 mg by mouth at bedtime.   [DISCONTINUED] tiZANidine (ZANAFLEX) 2 MG tablet Take by mouth as needed.     Allergies:   Aspirin, Lisinopril, Lithium, Other, Oxycodone, Oxycodone hcl, Oxycodone-acetaminophen, and Pork-derived products   Social History   Socioeconomic History    Marital status: Legally Separated    Spouse name: Not on file   Number of children: 1   Years of education: Masters   Highest education level: Not on file  Occupational History   Occupation: retired    Fish farm manager: RETIRED  Tobacco Use   Smoking status: Former    Pack years: 0.00   Smokeless tobacco: Never  Substance and Sexual Activity   Alcohol use: No    Alcohol/week: 1.0 standard drink    Types: 1 Cans of beer per week    Comment: socially   Drug use: No   Sexual activity: Never  Other Topics Concern   Not on file  Social History Narrative   Patient lives in [facility] living @ [facility]   Patient is right-handed.   Patient does not drink any caffeine.   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 History of coronary artery disease notable for no members. History of heart failure notable for no members. No history of cardiomyopathies including hypertrophic cardiomyopathy, left ventricular non-compaction, or arrhythmogenic right ventricular cardiomyopathy. History of arrhythmia notable for no members. Denies family history of sudden cardiac death including drowning, car accidents, or unexplained deaths in the family. No history of bicuspid aortic valve or aortic aneurysm or dissection.  ROS:   Please see the history of present illness.     All other systems reviewed and are negative.  EKGs/Labs/Other Studies Reviewed:    The following studies were reviewed  today:  EKG:   01/24/08 SR 82 WNL 12/16/19:  SR rate 65, iRBBB and LAFB  Transthoracic Echocardiogram: Date: 01/02/20 Results: Severe LA dilation; IVC WNL E/E' 20 RVSP 22 1. Left ventricular ejection fraction, by estimation, is 60 to 65%. The  left ventricle has normal function. The left ventricle has no regional  wall motion abnormalities. There is mild concentric left ventricular  hypertrophy. Left ventricular diastolic  parameters are consistent with Grade II diastolic dysfunction  (pseudonormalization). Elevated left atrial pressure.   2. Right ventricular systolic function is normal. The right ventricular  size is mildly enlarged. There is normal pulmonary artery systolic  pressure.   3. Left atrial size was severely  dilated.   4. Right atrial size was moderately dilated.   5. The mitral valve is normal in structure. No evidence of mitral valve  regurgitation. No evidence of mitral stenosis.   6. The aortic valve is normal in structure. Aortic valve regurgitation is  not visualized. Mild aortic valve sclerosis is present, with no evidence  of aortic valve stenosis.   7. The inferior vena cava is normal in size with greater than 50%  respiratory variability, suggesting right atrial pressure of 3 mmHg.  Recent Labs: 12/15/2019: B Natriuretic Peptide 19.2; TSH 2.605 12/16/2019: Hemoglobin 15.5; Platelets 287 12/18/2019: ALT 73 02/12/2020: BUN 9; Creatinine, Ser 1.05; Magnesium 1.9; Potassium 3.7; Sodium 140  Recent Lipid Panel    Component Value Date/Time   CHOL 140 06/04/2020 1320   TRIG 106 06/04/2020 1320   HDL 35 (L) 06/04/2020 1320   CHOLHDL 4.0 06/04/2020 1320   CHOLHDL 5.7 12/17/2019 0241   VLDL 36 12/17/2019 0241   LDLCALC 85 06/04/2020 1320   Risk Assessment/Calculations:     .The 10-year ASCVD risk score Mikey Bussing DC Brooke Bonito., et al., 2013) is: 20.8%   Values used to calculate the score:     Age: 7 years     Sex: Male     Is Non-Hispanic African American: Yes      Diabetic: Yes     Tobacco smoker: No     Systolic Blood Pressure: 409 mmHg     Is BP treated: Yes     HDL Cholesterol: 35 mg/dL     Total Cholesterol: 140 mg/dL   Physical Exam:    VS:  BP 100/60   Pulse 69   Ht 5\' 7"  (1.702 m)   Wt 98.4 kg   SpO2 96%   BMI 33.99 kg/m     Repeat BP left arm 150/80 (manual)  Wt Readings from Last 3 Encounters:  07/01/20 98.4 kg  06/04/20 96.5 kg  04/15/20 95 kg    GEN:  Well nourished, well developed in no acute distress HEENT: Normal NECK: No JVD LYMPHATICS: No lymphadenopathy CARDIAC: RRR, no murmurs, rubs, gallops RESPIRATORY:  Clear to auscultation without rales, wheezing or rhonchi  ABDOMEN: Soft, non-tender, non-distended MUSCULOSKELETAL:  Increase leg swelling bilaterally, painful on exam; No deformity  SKIN: Warm and dry NEUROLOGIC:  Alert and oriented x 3 PSYCHIATRIC:  Normal affect   ASSESSMENT:    1. Chronic heart failure with preserved ejection fraction ([place])   2. Hypertension, unspecified type   3. Hyperlipidemia, unspecified hyperlipidemia type     PLAN:    In order of problems listed above:  Heart Failure preserved Ejection Fraction HTN - NYHA class I, Stage II, euvolemic, etiology from HTN - Diuretic regimen: Lasix 40 mg PO daily with  40 mg PO in PM (will check BMP and Mg in 7-10 days - will check NT-proBNP today - daily weights, and fluid restriction of < 2 L  - amb BP not done- discussed return to BP checks given low BP today and high BP prior - Hydralazine 50 mg PO BID - candesartan 32 mg PO daily - Metoprolol succinate 25 mg PO daily - aldactone 25 mg PO was discontinued for unclear causes; has prior lisinopril allergy - had completed eval with HTN clinic  Erectile Dysfunction Hyperlipidemia -LDL goal less than 100 -Continue rosuvastatin 10 mg PO daily - LDL at goal 06/04/20 (85) - gave education on dietary changes  Three months follow up unless new symptoms or abnormal test results warranting  change in plan  Would be reasonable for  APP Follow up    Medication Adjustments/Labs and Tests Ordered: Current medicines are reviewed at length with the patient today.  Concerns regarding medicines are outlined above.  No orders of the defined types were placed in this encounter.  Meds ordered this encounter  Medications   furosemide (LASIX) 40 MG tablet    Sig: Take 1 tablet (40 mg total) by mouth 2 (two) times daily.    Dispense:  180 tablet    Refill:  3     Patient Instructions  Medication Instructions:  Your physician has recommended you make the following change in your medication: INCREASE: Furosemide (Lasix) to 40 mg by mouth twice daily  *If you need a refill on your cardiac medications before your next appointment, please call your pharmacy*   Lab Work: TODAY: BMET and Mg If you have labs (blood work) drawn today and your tests are completely normal, you will receive your results only by: [place] (if you have MyChart) OR A paper copy in the mail If you have any lab test that is abnormal or we need to change your treatment, we will call you to review the results.   Testing/Procedures: NONE   Follow-Up: At [facility], you and your health needs are our priority.  As part of our continuing mission to provide you with exceptional heart care, we have created designated Provider Care Teams.  These Care Teams include your primary Cardiologist (physician) and Advanced Practice Providers (APPs -  Physician Assistants and Nurse Practitioners) who all work together to provide you with the care you need, when you need it.  We recommend signing up for the patient portal called "MyChart".  Sign up information is provided on this After Visit Summary.  MyChart is used to connect with patients for Virtual Visits (Telemedicine).  Patients are able to view lab/test results, encounter notes, upcoming appointments, etc.  Non-urgent messages can be sent to your provider as  well.   To learn more about what you can do with MyChart, go to [email or web address].    Your next appointment:   3 month(s)  The format for your next appointment:   In Person  Provider:   You may see Werner Lean, MD or one of the following Advanced Practice Providers on your designated Care Team:   Melina Copa, PA-C Ermalinda Barrios, PA-C        Signed, Werner Lean, MD  07/01/2020 10:15 AM    [place]

## 2020-07-01 NOTE — Patient Instructions (Signed)
Medication Instructions:  Your physician has recommended you make the following change in your medication: INCREASE: Furosemide (Lasix) to 40 mg by mouth twice daily  *If you need a refill on your cardiac medications before your next appointment, please call your pharmacy*   Lab Work: TODAY: BMET and Mg If you have labs (blood work) drawn today and your tests are completely normal, you will receive your results only by: [place] (if you have MyChart) OR A paper copy in the mail If you have any lab test that is abnormal or we need to change your treatment, we will call you to review the results.   Testing/Procedures: NONE   Follow-Up: At [facility], you and your health needs are our priority.  As part of our continuing mission to provide you with exceptional heart care, we have created designated Provider Care Teams.  These Care Teams include your primary Cardiologist (physician) and Advanced Practice Providers (APPs -  Physician Assistants and Nurse Practitioners) who all work together to provide you with the care you need, when you need it.  We recommend signing up for the patient portal called "MyChart".  Sign up information is provided on this After Visit Summary.  MyChart is used to connect with patients for Virtual Visits (Telemedicine).  Patients are able to view lab/test results, encounter notes, upcoming appointments, etc.  Non-urgent messages can be sent to your provider as well.   To learn more about what you can do with MyChart, go to [email or web address].    Your next appointment:   3 month(s)  The format for your next appointment:   In Person  Provider:   You may see Werner Lean, MD or one of the following Advanced Practice Providers on your designated Care Team:   Melina Copa, PA-C Ermalinda Barrios, PA-C

## 2020-07-02 DIAGNOSIS — F332 Major depressive disorder, recurrent severe without psychotic features: Secondary | ICD-10-CM | POA: Diagnosis not present

## 2020-07-02 DIAGNOSIS — Z20828 Contact with and (suspected) exposure to other viral communicable diseases: Secondary | ICD-10-CM | POA: Diagnosis not present

## 2020-07-02 DIAGNOSIS — R918 Other nonspecific abnormal finding of lung field: Secondary | ICD-10-CM | POA: Diagnosis not present

## 2020-07-07 ENCOUNTER — Telehealth: Payer: Self-pay

## 2020-07-07 DIAGNOSIS — I10 Essential (primary) hypertension: Secondary | ICD-10-CM

## 2020-07-07 DIAGNOSIS — I5032 Chronic diastolic (congestive) heart failure: Secondary | ICD-10-CM

## 2020-07-07 NOTE — Telephone Encounter (Signed)
Pt notified of results and MD recommendations.  He reports that he has to take ibuprofen daily in order to play golf and work.  I asked if he could use tylenol instead his reply was no.  He reports that he takes 1600 mg of ibuprofen daily.  He will try to cut down to 800 mg daily.  Appointment made for fu lab work.

## 2020-07-07 NOTE — Telephone Encounter (Signed)
-----   Message from Werner Lean, MD sent at 07/04/2020  1:56 PM EDT ----- Results: Increase in creatinine Plan: Patient will need to decrease the amount of Ibuprofen he is taking Will need follow up BMP and BNP in 7-10 days (on increased lasix) Will need results sent to [facility] MD (Dr. Ronnald Ramp)  Werner Lean, MD

## 2020-07-09 ENCOUNTER — Ambulatory Visit: Payer: Medicare Other | Admitting: Adult Health

## 2020-07-09 DIAGNOSIS — R9389 Abnormal findings on diagnostic imaging of other specified body structures: Secondary | ICD-10-CM | POA: Diagnosis not present

## 2020-07-17 ENCOUNTER — Other Ambulatory Visit: Payer: Self-pay

## 2020-07-17 ENCOUNTER — Other Ambulatory Visit: Payer: Medicare Other | Admitting: *Deleted

## 2020-07-17 DIAGNOSIS — I5032 Chronic diastolic (congestive) heart failure: Secondary | ICD-10-CM

## 2020-07-17 DIAGNOSIS — I10 Essential (primary) hypertension: Secondary | ICD-10-CM

## 2020-07-18 LAB — BASIC METABOLIC PANEL
BUN/Creatinine Ratio: 8 — ABNORMAL LOW (ref 10–24)
BUN: 9 mg/dL (ref 8–27)
CO2: 23 mmol/L (ref 20–29)
Calcium: 9.3 mg/dL (ref 8.6–10.2)
Chloride: 101 mmol/L (ref 96–106)
Creatinine, Ser: 1.16 mg/dL (ref 0.76–1.27)
Glucose: 96 mg/dL (ref 65–99)
Potassium: 4.1 mmol/L (ref 3.5–5.2)
Sodium: 141 mmol/L (ref 134–144)
eGFR: 69 mL/min/{1.73_m2} (ref >59)

## 2020-07-18 LAB — PRO B NATRIURETIC PEPTIDE: NT-Pro BNP: 95 pg/mL (ref 0–376)

## 2020-07-21 DIAGNOSIS — Z20828 Contact with and (suspected) exposure to other viral communicable diseases: Secondary | ICD-10-CM | POA: Diagnosis not present

## 2020-07-30 DIAGNOSIS — F332 Major depressive disorder, recurrent severe without psychotic features: Secondary | ICD-10-CM | POA: Diagnosis not present

## 2020-08-06 ENCOUNTER — Institutional Professional Consult (permissible substitution): Payer: BC Managed Care – PPO | Admitting: Neurology

## 2020-08-20 DIAGNOSIS — Z20828 Contact with and (suspected) exposure to other viral communicable diseases: Secondary | ICD-10-CM | POA: Diagnosis not present

## 2020-08-27 DIAGNOSIS — F332 Major depressive disorder, recurrent severe without psychotic features: Secondary | ICD-10-CM | POA: Diagnosis not present

## 2020-08-31 DIAGNOSIS — Z20828 Contact with and (suspected) exposure to other viral communicable diseases: Secondary | ICD-10-CM | POA: Diagnosis not present

## 2020-09-03 DIAGNOSIS — Z20828 Contact with and (suspected) exposure to other viral communicable diseases: Secondary | ICD-10-CM | POA: Diagnosis not present

## 2020-09-07 DIAGNOSIS — Z20828 Contact with and (suspected) exposure to other viral communicable diseases: Secondary | ICD-10-CM | POA: Diagnosis not present

## 2020-09-08 ENCOUNTER — Ambulatory Visit: Payer: Medicare Other | Admitting: Neurology

## 2020-09-09 DIAGNOSIS — E7800 Pure hypercholesterolemia, unspecified: Secondary | ICD-10-CM | POA: Diagnosis not present

## 2020-09-09 DIAGNOSIS — Z1339 Encounter for screening examination for other mental health and behavioral disorders: Secondary | ICD-10-CM | POA: Diagnosis not present

## 2020-09-09 DIAGNOSIS — F17210 Nicotine dependence, cigarettes, uncomplicated: Secondary | ICD-10-CM | POA: Diagnosis not present

## 2020-09-09 DIAGNOSIS — R5383 Other fatigue: Secondary | ICD-10-CM | POA: Diagnosis not present

## 2020-09-09 DIAGNOSIS — Z1331 Encounter for screening for depression: Secondary | ICD-10-CM | POA: Diagnosis not present

## 2020-09-09 DIAGNOSIS — Z131 Encounter for screening for diabetes mellitus: Secondary | ICD-10-CM | POA: Diagnosis not present

## 2020-09-09 DIAGNOSIS — E559 Vitamin D deficiency, unspecified: Secondary | ICD-10-CM | POA: Diagnosis not present

## 2020-09-09 DIAGNOSIS — I10 Essential (primary) hypertension: Secondary | ICD-10-CM | POA: Diagnosis not present

## 2020-09-09 DIAGNOSIS — R2689 Other abnormalities of gait and mobility: Secondary | ICD-10-CM | POA: Diagnosis not present

## 2020-09-09 DIAGNOSIS — Z125 Encounter for screening for malignant neoplasm of prostate: Secondary | ICD-10-CM | POA: Diagnosis not present

## 2020-09-09 DIAGNOSIS — Z113 Encounter for screening for infections with a predominantly sexual mode of transmission: Secondary | ICD-10-CM | POA: Diagnosis not present

## 2020-09-09 DIAGNOSIS — Z1159 Encounter for screening for other viral diseases: Secondary | ICD-10-CM | POA: Diagnosis not present

## 2020-09-09 DIAGNOSIS — Z0000 Encounter for general adult medical examination without abnormal findings: Secondary | ICD-10-CM | POA: Diagnosis not present

## 2020-09-10 DIAGNOSIS — F332 Major depressive disorder, recurrent severe without psychotic features: Secondary | ICD-10-CM | POA: Diagnosis not present

## 2020-09-15 ENCOUNTER — Other Ambulatory Visit (HOSPITAL_COMMUNITY): Payer: Self-pay | Admitting: Psychiatry

## 2020-09-15 ENCOUNTER — Telehealth (HOSPITAL_COMMUNITY): Payer: Self-pay | Admitting: *Deleted

## 2020-09-15 DIAGNOSIS — F3342 Major depressive disorder, recurrent, in full remission: Secondary | ICD-10-CM

## 2020-09-15 DIAGNOSIS — F419 Anxiety disorder, unspecified: Secondary | ICD-10-CM

## 2020-09-15 NOTE — Telephone Encounter (Signed)
Pharmacy request for his Quetiapine,trazodone and Buspirone. He should have a supply till 08/24/20. He has a next appt on 9/8 with Dr Ronne Binning, Should be OK with his meds till his next appt.

## 2020-09-16 DIAGNOSIS — E559 Vitamin D deficiency, unspecified: Secondary | ICD-10-CM | POA: Diagnosis not present

## 2020-09-16 DIAGNOSIS — R7303 Prediabetes: Secondary | ICD-10-CM | POA: Diagnosis not present

## 2020-09-16 DIAGNOSIS — E7800 Pure hypercholesterolemia, unspecified: Secondary | ICD-10-CM | POA: Diagnosis not present

## 2020-09-16 DIAGNOSIS — I10 Essential (primary) hypertension: Secondary | ICD-10-CM | POA: Diagnosis not present

## 2020-09-17 DIAGNOSIS — E213 Hyperparathyroidism, unspecified: Secondary | ICD-10-CM | POA: Diagnosis not present

## 2020-09-18 DIAGNOSIS — M6281 Muscle weakness (generalized): Secondary | ICD-10-CM | POA: Diagnosis not present

## 2020-09-18 DIAGNOSIS — R2681 Unsteadiness on feet: Secondary | ICD-10-CM | POA: Diagnosis not present

## 2020-09-18 DIAGNOSIS — M25561 Pain in right knee: Secondary | ICD-10-CM | POA: Diagnosis not present

## 2020-09-23 DIAGNOSIS — F332 Major depressive disorder, recurrent severe without psychotic features: Secondary | ICD-10-CM | POA: Diagnosis not present

## 2020-09-24 ENCOUNTER — Other Ambulatory Visit: Payer: Self-pay

## 2020-09-24 ENCOUNTER — Encounter (HOSPITAL_COMMUNITY): Payer: Self-pay | Admitting: Psychiatry

## 2020-09-24 ENCOUNTER — Ambulatory Visit (INDEPENDENT_AMBULATORY_CARE_PROVIDER_SITE_OTHER): Payer: Medicare Other | Admitting: Psychiatry

## 2020-09-24 DIAGNOSIS — Z8616 Personal history of COVID-19: Secondary | ICD-10-CM | POA: Diagnosis not present

## 2020-09-24 DIAGNOSIS — F3342 Major depressive disorder, recurrent, in full remission: Secondary | ICD-10-CM | POA: Diagnosis not present

## 2020-09-24 DIAGNOSIS — F419 Anxiety disorder, unspecified: Secondary | ICD-10-CM

## 2020-09-24 DIAGNOSIS — M25561 Pain in right knee: Secondary | ICD-10-CM | POA: Diagnosis not present

## 2020-09-24 DIAGNOSIS — R2681 Unsteadiness on feet: Secondary | ICD-10-CM | POA: Diagnosis not present

## 2020-09-24 DIAGNOSIS — M6281 Muscle weakness (generalized): Secondary | ICD-10-CM | POA: Diagnosis not present

## 2020-09-24 MED ORDER — BUSPIRONE HCL 10 MG PO TABS: ORAL_TABLET | ORAL | 3 refills | Status: DC

## 2020-09-24 MED ORDER — PAROXETINE HCL ER 37.5 MG PO TB24: 37.5000 mg | ORAL_TABLET | Freq: Every day | ORAL | 3 refills | Status: DC

## 2020-09-24 MED ORDER — QUETIAPINE FUMARATE 25 MG PO TABS: ORAL_TABLET | ORAL | 3 refills | Status: DC

## 2020-09-24 MED ORDER — BELSOMRA 10 MG PO TABS: 10.0000 mg | ORAL_TABLET | Freq: Every evening | ORAL | 2 refills | Status: DC

## 2020-09-24 MED ORDER — TRAZODONE HCL 150 MG PO TABS: 150.0000 mg | ORAL_TABLET | Freq: Every evening | ORAL | 3 refills | Status: DC | PRN

## 2020-09-24 NOTE — Progress Notes (Signed)
BH MD/PA/NP OP Progress Note  09/24/2020 10:43 AM [name]  MRN:  [id number]  Chief Complaint: "  I medications were great" Chief Complaint   Medication Management    HPI: 70 year old male seen today for follow-up psychiatric evaluation.  He has a psychiatric history of depression, anxiety, and TD.  He is currently managed on BuSpar 10 mg twice daily,  Paxil 37.5 mg daily, Seroquel 25 mg nightly, and trazodone 150 mg nightly as needed.  He notes his medications are effective in managing his psychiatric conditions.  Today he is pleasant, cooperative, engaged in conversation, and maintains eye contact.  He informed Probation officer that since his last visit he has been doing well.  He notes that he has minimal anxiety and depression.  He reports his medications are working great.  Provider conducted a GAD-7 and patient scored a 0.  Provider also conducted a PHQ-9 4 scored a 4.  He endorses adequate sleep and appetite.  He denies SI/HI/VAH, mania, paranoia.  Patient noted to have abnormal movements shoulder.  Patient would roll shoulders unconsciously.  He informed Probation officer that he was unaware that he was doing it and notes that it does not cause him disturbance. Provider did aims assessment and patient scored a 1.  Patient notes that he does have pain in his bilateral knees.  He notes that his bone rubs against each other.  Provider asked patient if he was seen by an orthopedic specialist and he notes that he was not.  Provider recommended patient follow-up with Ortho which he notes that he would consider.  Patient also notes that he has issues maintaining an erection.  He informed Probation officer that he is dating a Caucasian male which she reports is new for him.  He notes that he enjoys the relationship however reports that at times she can be extremely jealous.  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  No medication changes made today.  He will  continue all other medications as prescribed. Visit Diagnosis:    ICD-10-CM   1. Anxiety  F41.9 busPIRone (BUSPAR) 10 MG tablet    2. Recurrent major depressive disorder, in full remission ([place])  F33.42 traZODone (DESYREL) 150 MG tablet    QUEtiapine (SEROQUEL) 25 MG tablet    PARoxetine (PAXIL-CR) 37.5 MG 24 hr tablet    Suvorexant (BELSOMRA) 10 MG TABS      Past Psychiatric History:  MDD, TD, anxiety  Past Medical History:  Past Medical History:  Diagnosis Date   Depression    Frequent urination    GERD (gastroesophageal reflux disease)    Herpes    Hyperlipidemia    Hypertension    Memory loss    Obesity    Stroke [facility])    Urgency of urination     Past Surgical History:  Procedure Laterality Date   CHOLECYSTECTOMY N/A 12/17/2019   Procedure: LAPAROSCOPIC CHOLECYSTECTOMY WITH ICG INJECTION;  Surgeon: Rolm Bookbinder, MD;  Location: [facility] OR;  Service: General;  Laterality: N/A;   disckec     SPINAL FUSION     VASECTOMY      Family Psychiatric History: Denies  Family History:  Family History  Problem Relation Age of Onset   Hypertension Mother    Hypertension Father    Hypertension Sister    Hypertension Brother    Cancer Maternal Aunt    Cancer - Ovarian Maternal Grandmother    Diabetes Mellitus I Paternal  Grandmother    Hypertension Paternal Grandfather    Cancer Maternal Aunt     Social History:  Social History   Socioeconomic History   Marital status: Legally Separated    Spouse name: Not on file   Number of children: 1   Years of education: Masters   Highest education level: Not on file  Occupational History   Occupation: retired    Fish farm manager: RETIRED  Tobacco Use   Smoking status: Former   Smokeless tobacco: Never  Substance and Sexual Activity   Alcohol use: No    Alcohol/week: 1.0 standard drink    Types: 1 Cans of beer per week    Comment: socially   Drug use: No   Sexual activity: Never  Other Topics Concern   Not on file  Social  History Narrative   Patient lives in [place] living @ [place]   Patient is right-handed.   Patient does not drink any caffeine.   Social Determinants of Health   Financial Resource Strain: Not on file  Food Insecurity: Not on file  Transportation Needs: Not on file  Physical Activity: Not on file  Stress: Not on file  Social Connections: Not on file    Allergies:  Allergies  Allergen Reactions   Aspirin Other (See Comments)    Dizziness, nausea and vomiting Balance issues   Lisinopril Other (See Comments)    Critical Cough   Lithium Other (See Comments)    Critical Per pt reaction unknown   Other Other (See Comments)   Oxycodone Nausea And Vomiting   Oxycodone Hcl Other (See Comments)   Oxycodone-Acetaminophen Nausea And Vomiting and Other (See Comments)    Also causes dizziness   Pork-Derived Products Other (See Comments)    Unspecified reaction    Metabolic Disorder Labs: Lab Results  Component Value Date   HGBA1C 5.9 (H) 06/04/2020   MPG 114.02 12/17/2019   MPG 120 (H) 12/20/2009   No results found for: PROLACTIN Lab Results  Component Value Date   CHOL 140 06/04/2020   TRIG 106 06/04/2020   HDL 35 (L) 06/04/2020   CHOLHDL 4.0 06/04/2020   VLDL 36 12/17/2019   LDLCALC 85 06/04/2020   LDLCALC 109 (H) 12/17/2019   Lab Results  Component Value Date   TSH 2.605 12/15/2019   TSH 2.850 10/16/2013    Therapeutic Level Labs: No results found for: LITHIUM No results found for: VALPROATE No components found for:  CBMZ  Current Medications: Current Outpatient Medications  Medication Sig Dispense Refill   amLODipine (NORVASC) 5 MG tablet Take 1 tablet (5 mg total) by mouth daily. 90 tablet 3   candesartan (ATACAND) 32 MG tablet Take 32 mg by mouth daily.     clopidogrel (PLAVIX) 75 MG tablet Take 1 tablet (75 mg total) by mouth daily. 30 tablet 11   Diclofenac Sodium 3 % GEL as needed.     furosemide (LASIX) 40 MG tablet Take 1 tablet (40 mg  total) by mouth 2 (two) times daily. 180 tablet 3   gabapentin (NEURONTIN) 400 MG capsule Take 400 mg by mouth in the morning and at bedtime.     meloxicam (MOBIC) 15 MG tablet Take 15 mg by mouth daily.     metoprolol succinate (TOPROL-XL) 25 MG 24 hr tablet Take 25 mg by mouth daily.      ondansetron (ZOFRAN) 4 MG tablet Take 1 tablet (4 mg total) by mouth every 8 (eight) hours as needed for nausea. 8 tablet 5  primidone (MYSOLINE) 250 MG tablet TAKE 1 TABLET BY MOUTH TWICE A DAY 60 tablet 12   rosuvastatin (CRESTOR) 10 MG tablet Take 1 tablet (10 mg total) by mouth daily. 90 tablet 3   sildenafil (VIAGRA) 50 MG tablet 100 mg as needed.     Suvorexant (BELSOMRA) 10 MG TABS Take 10 mg by mouth at bedtime. 30 tablet 2   valACYclovir (VALTREX) 1000 MG tablet Take 1,000 mg by mouth as needed (herpes).     busPIRone (BUSPAR) 10 MG tablet TAKE 2 TABLETS BY MOUTH TWICE A DAY 120 tablet 3   hydrALAZINE (APRESOLINE) 50 MG tablet Take 1 tablet (50 mg total) by mouth 2 (two) times daily. 270 tablet 3   PARoxetine (PAXIL-CR) 37.5 MG 24 hr tablet Take 1 tablet (37.5 mg total) by mouth at bedtime. 30 tablet 3   QUEtiapine (SEROQUEL) 25 MG tablet TAKE 3 TABLETS BY MOUTH AT BEDTIME 90 tablet 3   traZODone (DESYREL) 150 MG tablet Take 1 tablet (150 mg total) by mouth at bedtime as needed. for sleep 30 tablet 3   No current facility-administered medications for this visit.     Musculoskeletal: Strength & Muscle Tone:  Within normal limits Gait & Station: broad based Patient leans: N/A  Psychiatric Specialty Exam: Review of Systems  Blood pressure 128/68, pulse 80, height '5\' 7"'$  (1.702 m), weight 207 lb (93.9 kg).Body mass index is 32.42 kg/m.  General Appearance: Well Groomed  Eye Contact:  Good  Speech:  Clear and Coherent and Normal Rate  Volume:  Normal  Mood:  Euthymic  Affect:  Appropriate and Congruent  Thought Process:  Coherent, Goal Directed, and Linear  Orientation:  Full (Time, Place,  and Person)  Thought Content: WDL and Logical   Suicidal Thoughts:  No  Homicidal Thoughts:  No  Memory:  Immediate;   Good Recent;   Good Remote;   Good  Judgement:  Good  Insight:  Good  Psychomotor Activity:   Patient rocks his shoulder however he does not find it distressful.  Other activities within normal limit  Concentration:  Concentration: Good and Attention Span: Good  Recall:  Good  Fund of Knowledge: Good  Language: Good  Akathisia:  No  Handed:  Right  AIMS (if indicated): not done  Assets:  Communication Skills Desire for Improvement Financial Resources/Insurance Housing Intimacy Physical Health Social Support  ADL's:  Intact  Cognition: WNL  Sleep:  Good   Screenings: AIMS    Flowsheet Row Clinical Support from 09/24/2020 in [facility] Admission (Discharged) from OP Visit from 02/02/2019 in [place] Total Score 1 12      AUDIT    Flowsheet Row Admission (Discharged) from OP Visit from 02/02/2019 in [place]  Alcohol Use Disorder Identification Test Final Score (AUDIT) 0      GAD-7    Flowsheet Row Clinical Support from 09/24/2020 in [facility]  Total GAD-7 Score [place] Office Visit from 01/09/2020 in [name] Office Visit from 03/25/2019 in [facility] Office Visit from 09/12/2018 in [facility] Office Visit from 03/12/2018 in [facility] Office Visit from 09/26/2017 in [facility]  Total Score (max 30 points ) '27 24 24 28 28      '$ PHQ2-9    Flowsheet Row Clinical Support from 09/24/2020 in [facility]  PHQ-2  Total Score 0  PHQ-9 Total Score 4      Flowsheet Row ED from 03/24/2020 in [place] DEPT Admission (Discharged) from OP Visit from 02/02/2019 in  [place] No Risk Error: Question 1 not populated        Assessment and Plan: Patient notes that he is doing well on his current medication regimen.  No medication changes made today.  Patient agreeable to continue medications as prescribed.  1. Anxiety  Continue- busPIRone (BUSPAR) 10 MG tablet; TAKE 2 TABLETS BY MOUTH TWICE A DAY  Dispense: 120 tablet; Refill: 3  2. Recurrent major depressive disorder, in full remission ([place])  Continue- traZODone (DESYREL) 150 MG tablet; Take 1 tablet (150 mg total) by mouth at bedtime as needed. for sleep  Dispense: 30 tablet; Refill: 3 Continue- QUEtiapine (SEROQUEL) 25 MG tablet; TAKE 3 TABLETS BY MOUTH AT BEDTIME  Dispense: 90 tablet; Refill: 3 Continue- PARoxetine (PAXIL-CR) 37.5 MG 24 hr tablet; Take 1 tablet (37.5 mg total) by mouth at bedtime.  Dispense: 30 tablet; Refill: 3 Continue- Suvorexant (BELSOMRA) 10 MG TABS; Take 10 mg by mouth at bedtime.  Dispense: 30 tablet; Refill: 2  Follow-up in 3 months   Salley Slaughter, NP 09/24/2020, 10:43 AM

## 2020-09-28 DIAGNOSIS — Z20828 Contact with and (suspected) exposure to other viral communicable diseases: Secondary | ICD-10-CM | POA: Diagnosis not present

## 2020-09-28 DIAGNOSIS — M25561 Pain in right knee: Secondary | ICD-10-CM | POA: Diagnosis not present

## 2020-09-28 DIAGNOSIS — M6281 Muscle weakness (generalized): Secondary | ICD-10-CM | POA: Diagnosis not present

## 2020-09-28 DIAGNOSIS — R2681 Unsteadiness on feet: Secondary | ICD-10-CM | POA: Diagnosis not present

## 2020-09-29 DIAGNOSIS — A6002 Herpesviral infection of other male genital organs: Secondary | ICD-10-CM | POA: Diagnosis not present

## 2020-09-29 DIAGNOSIS — Z8673 Personal history of transient ischemic attack (TIA), and cerebral infarction without residual deficits: Secondary | ICD-10-CM | POA: Diagnosis not present

## 2020-09-29 DIAGNOSIS — I10 Essential (primary) hypertension: Secondary | ICD-10-CM | POA: Diagnosis not present

## 2020-09-30 DIAGNOSIS — M25561 Pain in right knee: Secondary | ICD-10-CM | POA: Diagnosis not present

## 2020-09-30 DIAGNOSIS — R2681 Unsteadiness on feet: Secondary | ICD-10-CM | POA: Diagnosis not present

## 2020-09-30 DIAGNOSIS — M6281 Muscle weakness (generalized): Secondary | ICD-10-CM | POA: Diagnosis not present

## 2020-10-01 ENCOUNTER — Institutional Professional Consult (permissible substitution): Payer: Medicare Other | Admitting: Neurology

## 2020-10-01 DIAGNOSIS — Z20828 Contact with and (suspected) exposure to other viral communicable diseases: Secondary | ICD-10-CM | POA: Diagnosis not present

## 2020-10-02 ENCOUNTER — Encounter: Payer: Self-pay | Admitting: Internal Medicine

## 2020-10-02 ENCOUNTER — Ambulatory Visit (INDEPENDENT_AMBULATORY_CARE_PROVIDER_SITE_OTHER): Payer: Medicare Other | Admitting: Internal Medicine

## 2020-10-02 ENCOUNTER — Other Ambulatory Visit: Payer: Self-pay

## 2020-10-02 VITALS — BP 126/68 | HR 65 | Ht 67.0 in | Wt 203.4 lb

## 2020-10-02 DIAGNOSIS — I10 Essential (primary) hypertension: Secondary | ICD-10-CM

## 2020-10-02 DIAGNOSIS — I5032 Chronic diastolic (congestive) heart failure: Secondary | ICD-10-CM

## 2020-10-02 NOTE — Patient Instructions (Addendum)
Medication Instructions:   Your physician recommends that you continue on your current medications as directed. Please refer to the Current Medication list given to you today.  *If you need a refill on your cardiac medications before your next appointment, please call your pharmacy*   Follow-Up: At [facility], you and your health needs are our priority.  As part of our continuing mission to provide you with exceptional heart care, we have created designated Provider Care Teams.  These Care Teams include your primary Cardiologist (physician) and Advanced Practice Providers (APPs -  Physician Assistants and Nurse Practitioners) who all work together to provide you with the care you need, when you need it.  We recommend signing up for the patient portal called "MyChart".  Sign up information is provided on this After Visit Summary.  MyChart is used to connect with patients for Virtual Visits (Telemedicine).  Patients are able to view lab/test results, encounter notes, upcoming appointments, etc.  Non-urgent messages can be sent to your provider as well.   To learn more about what you can do with MyChart, go to [email or web address].    Your next appointment:   6 MONTH   The format for your next appointment:   In Person  Provider:   Rudean Haskell, MD

## 2020-10-02 NOTE — Progress Notes (Signed)
Cardiology Office Note:    Date:  10/02/2020   ID:  [name], DOB [birth year], MRN [id number]  PCP:  Kristie Cowman, MD  [facility] Cardiologist:  Werner Lean, MD  [place] Electrophysiologist:  None   CC: Follow up BP and HLD  History of Present Illness:    [name] is a 70 y.o. male with a hx of HTN, HLD, Stroke NOS, MCD NOS, who presented for evaluation 01/01/21.  Since prior has been on spironolactone and lasix 40 mg PO Daily.  Did not get labs afterwards.  Last seen 02/12/20.  In interim had DOD visit increase in medication,   In interim of this visit, patient had worsening headache and had start on amlodipine and hydralazine.  In interim of this visit, patient had further increase in his hydralazine with [facility] Clinic.  At last visit, he had new AKI.  Advised to decrease ibuprofen, follow up with his PCP.  Creatinine greatly improved 07/17/20.  Seen 10/02/20.  Patient notes that he is doing [place]- he is going out on the golf course because he has been busy at work.  With stopping ibuprofen kidney recovered.  Notes that they give him a medication that helps him go to sleep; he has been without it and has had more fatigue.  Though he notes he has increase fatigue he has no exertional symptoms.  No chest pain or pressure .  No SOB/DOE and no PND/Orthopnea.  No weight gain or leg swelling.  No palpitations or syncope .  Ambulatory blood pressure 128/68.  Lives in an independent living facilty.   Past Medical History:  Diagnosis Date   Depression    Frequent urination    GERD (gastroesophageal reflux disease)    Herpes    Hyperlipidemia    Hypertension    Memory loss    Obesity    Stroke [facility])    Urgency of urination     Past Surgical History:  Procedure Laterality Date   CHOLECYSTECTOMY N/A 12/17/2019   Procedure: LAPAROSCOPIC CHOLECYSTECTOMY WITH ICG INJECTION;  Surgeon: Rolm Bookbinder, MD;  Location: [place];  Service: General;  Laterality: N/A;    disckec     SPINAL FUSION     VASECTOMY      Current Medications: Current Meds  Medication Sig   amLODipine (NORVASC) 5 MG tablet Take 1 tablet (5 mg total) by mouth daily.   busPIRone (BUSPAR) 10 MG tablet TAKE 2 TABLETS BY MOUTH TWICE A DAY   candesartan (ATACAND) 32 MG tablet Take 32 mg by mouth daily.   clopidogrel (PLAVIX) 75 MG tablet Take 1 tablet (75 mg total) by mouth daily.   Diclofenac Sodium 3 % GEL as needed.   furosemide (LASIX) 40 MG tablet Take 1 tablet (40 mg total) by mouth 2 (two) times daily.   gabapentin (NEURONTIN) 400 MG capsule Take 400 mg by mouth in the morning and at bedtime.   hydrALAZINE (APRESOLINE) 50 MG tablet Take 1 tablet (50 mg total) by mouth 2 (two) times daily.   metoprolol succinate (TOPROL-XL) 25 MG 24 hr tablet Take 25 mg by mouth daily.    ondansetron (ZOFRAN) 4 MG tablet Take 1 tablet (4 mg total) by mouth every 8 (eight) hours as needed for nausea.   PARoxetine (PAXIL-CR) 37.5 MG 24 hr tablet Take 1 tablet (37.5 mg total) by mouth at bedtime.   primidone (MYSOLINE) 250 MG tablet TAKE 1 TABLET BY MOUTH TWICE A DAY   QUEtiapine (SEROQUEL)  25 MG tablet TAKE 3 TABLETS BY MOUTH AT BEDTIME   sildenafil (VIAGRA) 50 MG tablet 100 mg as needed.   spironolactone (ALDACTONE) 25 MG tablet Take 25 mg by mouth daily.   Suvorexant (BELSOMRA) 10 MG TABS Take 10 mg by mouth at bedtime.   traZODone (DESYREL) 150 MG tablet Take 1 tablet (150 mg total) by mouth at bedtime as needed. for sleep   valACYclovir (VALTREX) 1000 MG tablet Take 1,000 mg by mouth as needed (herpes).     Allergies:   Aspirin, Lisinopril, Lithium, Other, Oxycodone, Oxycodone hcl, Oxycodone-acetaminophen, and Pork-derived products   Social History   Socioeconomic History   Marital status: Legally Separated    Spouse name: Not on file   Number of children: 1   Years of education: Masters   Highest education level: Not on file  Occupational History   Occupation: retired    Fish farm manager:  RETIRED  Tobacco Use   Smoking status: Former   Smokeless tobacco: Never  Substance and Sexual Activity   Alcohol use: No    Alcohol/week: 1.0 standard drink    Types: 1 Cans of beer per week    Comment: socially   Drug use: No   Sexual activity: Never  Other Topics Concern   Not on file  Social History Narrative   Patient lives in [place] living @ [place]   Patient is right-handed.   Patient does not drink any caffeine.   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 History of coronary artery disease notable for no members. History of heart failure notable for no members. No history of cardiomyopathies including hypertrophic cardiomyopathy, left ventricular non-compaction, or arrhythmogenic right ventricular cardiomyopathy. History of arrhythmia notable for no members. Denies family history of sudden cardiac death including drowning, car accidents, or unexplained deaths in the family. No history of bicuspid aortic valve or aortic aneurysm or dissection.  ROS:   Please see the history of present illness.     All other systems reviewed and are negative.  EKGs/Labs/Other Studies Reviewed:    The following studies were reviewed today:  EKG:   01/24/08 SR 82 WNL 12/16/19:  SR rate 65, iRBBB and LAFB  Transthoracic Echocardiogram: Date: 01/02/20 Results: Severe LA dilation; IVC WNL E/E' 20 RVSP 22 1. Left ventricular ejection fraction, by estimation, is  60 to 65%. The  left ventricle has normal function. The left ventricle has no regional  wall motion abnormalities. There is mild concentric left ventricular  hypertrophy. Left ventricular diastolic  parameters are consistent with Grade II diastolic dysfunction  (pseudonormalization). Elevated left atrial pressure.   2. Right ventricular systolic function is normal. The right ventricular  size is mildly enlarged. There is normal pulmonary artery systolic  pressure.   3. Left atrial size was severely dilated.   4. Right atrial size was moderately dilated.   5. The mitral valve is normal in structure. No evidence of mitral valve  regurgitation. No evidence of mitral stenosis.   6.  The aortic valve is normal in structure. Aortic valve regurgitation is  not visualized. Mild aortic valve sclerosis is present, with no evidence  of aortic valve stenosis.   7. The inferior vena cava is normal in size with greater than 50%  respiratory variability, suggesting right atrial pressure of 3 mmHg.  Recent Labs: 12/15/2019: B Natriuretic Peptide 19.2; TSH 2.605 12/16/2019: Hemoglobin 15.5; Platelets 287 12/18/2019: ALT 73 07/01/2020: Magnesium 1.8 07/17/2020: BUN 9; Creatinine, Ser 1.16; NT-Pro BNP 95; Potassium 4.1; Sodium 141  Recent Lipid Panel    Component Value Date/Time   CHOL 140 06/04/2020 1320   TRIG 106 06/04/2020 1320   HDL 35 (L) 06/04/2020 1320   CHOLHDL 4.0 06/04/2020 1320   CHOLHDL 5.7 12/17/2019 0241   VLDL 36 12/17/2019 0241   LDLCALC 85 06/04/2020 1320    Physical Exam:    VS:  BP 126/68   Pulse 65   Ht '5\' 7"'$  (1.702 m)   Wt 203 lb 6.4 oz (92.3 kg)   SpO2 97%   BMI 31.86 kg/m     Wt Readings from Last 3 Encounters:  10/02/20 203 lb 6.4 oz (92.3 kg)  07/01/20 217 lb (98.4 kg)  06/04/20 212 lb 12.8 oz (96.5 kg)    GEN:  Well nourished, well developed in no acute distress HEENT: Normal NECK: No JVD LYMPHATICS: No lymphadenopathy CARDIAC: RRR, no murmurs, rubs,  gallops RESPIRATORY:  Clear to auscultation without rales, wheezing or rhonchi  ABDOMEN: Soft, non-tender, non-distended MUSCULOSKELETAL:  Increase leg swelling bilaterally, painful on exam; No deformity  SKIN: Warm and dry NEUROLOGIC:  Alert and oriented x 3 PSYCHIATRIC:  Normal affect   ASSESSMENT:    1. Chronic heart failure with preserved ejection fraction ([place])   2. Hypertension, unspecified type     PLAN:     Heart Failure preserved Ejection Fraction HTN - NYHA class I, Stage II, euvolemic, etiology from HTN - Diuretic regimen: Lasix 40 mg PO daily  - daily weights, and fluid restriction of < 2 L  - amb BP at goal and at stable from in the office - Hydralazine 50 mg PO BID - candesartan 32 mg PO daily - Metoprolol succinate 25 mg PO daily - aldactone 25 mg PO was discontinued for unclear cause but has done well of the medication; has prior lisinopril allergy - had completed eval with HTN clinic  Hyperlipidemia Erectile Dysfunction -LDL goal less than 100 -Continue rosuvastatin 10 mg PO daily - LDL at goal 06/04/20 (85)   Will plan for 6 months follow up unless new symptoms or abnormal test results warranting change in plan.  If he notes that if he have a worsening with his fatigue or if there is an exertional component that given his risk factors stress testing is worthy of consideration  Would be reasonable for  APP Follow up     Medication Adjustments/Labs and Tests Ordered: Current medicines are reviewed at length with the patient today.  Concerns regarding medicines are outlined above.  No orders of the defined types were placed in this encounter.  No orders of the defined types were placed in this encounter.    Patient Instructions  Medication Instructions:   Your physician recommends that you continue on your current medications as directed. Please refer to the Current Medication list given to you today.  *If you need a refill on your cardiac  medications before your next appointment, please call your pharmacy*   Follow-Up: At [facility], you and your  health needs are our priority.  As part of our continuing mission to provide you with exceptional heart care, we have created designated Provider Care Teams.  These Care Teams include your primary Cardiologist (physician) and Advanced Practice Providers (APPs -  Physician Assistants and Nurse Practitioners) who all work together to provide you with the care you need, when you need it.  We recommend signing up for the patient portal called "MyChart".  Sign up information is provided on this After Visit Summary.  MyChart is used to connect with patients for Virtual Visits (Telemedicine).  Patients are able to view lab/test results, encounter notes, upcoming appointments, etc.  Non-urgent messages can be sent to your provider as well.   To learn more about what you can do with MyChart, go to [email or web address].    Your next appointment:   6 MONTH   The format for your next appointment:   In Person  Provider:   Rudean Haskell, MD      Signed, Werner Lean, MD  10/02/2020 9:37 AM    [place]

## 2020-10-05 DIAGNOSIS — M25561 Pain in right knee: Secondary | ICD-10-CM | POA: Diagnosis not present

## 2020-10-05 DIAGNOSIS — Z20828 Contact with and (suspected) exposure to other viral communicable diseases: Secondary | ICD-10-CM | POA: Diagnosis not present

## 2020-10-05 DIAGNOSIS — R2681 Unsteadiness on feet: Secondary | ICD-10-CM | POA: Diagnosis not present

## 2020-10-05 DIAGNOSIS — M6281 Muscle weakness (generalized): Secondary | ICD-10-CM | POA: Diagnosis not present

## 2020-10-07 DIAGNOSIS — M25561 Pain in right knee: Secondary | ICD-10-CM | POA: Diagnosis not present

## 2020-10-07 DIAGNOSIS — R2681 Unsteadiness on feet: Secondary | ICD-10-CM | POA: Diagnosis not present

## 2020-10-07 DIAGNOSIS — M6281 Muscle weakness (generalized): Secondary | ICD-10-CM | POA: Diagnosis not present

## 2020-10-08 DIAGNOSIS — Z20828 Contact with and (suspected) exposure to other viral communicable diseases: Secondary | ICD-10-CM | POA: Diagnosis not present

## 2020-10-09 DIAGNOSIS — M25561 Pain in right knee: Secondary | ICD-10-CM | POA: Diagnosis not present

## 2020-10-09 DIAGNOSIS — M6281 Muscle weakness (generalized): Secondary | ICD-10-CM | POA: Diagnosis not present

## 2020-10-09 DIAGNOSIS — R2681 Unsteadiness on feet: Secondary | ICD-10-CM | POA: Diagnosis not present

## 2020-10-12 DIAGNOSIS — M25561 Pain in right knee: Secondary | ICD-10-CM | POA: Diagnosis not present

## 2020-10-12 DIAGNOSIS — R2681 Unsteadiness on feet: Secondary | ICD-10-CM | POA: Diagnosis not present

## 2020-10-12 DIAGNOSIS — M6281 Muscle weakness (generalized): Secondary | ICD-10-CM | POA: Diagnosis not present

## 2020-10-14 DIAGNOSIS — R2681 Unsteadiness on feet: Secondary | ICD-10-CM | POA: Diagnosis not present

## 2020-10-14 DIAGNOSIS — M25561 Pain in right knee: Secondary | ICD-10-CM | POA: Diagnosis not present

## 2020-10-14 DIAGNOSIS — M6281 Muscle weakness (generalized): Secondary | ICD-10-CM | POA: Diagnosis not present

## 2020-10-16 DIAGNOSIS — M25561 Pain in right knee: Secondary | ICD-10-CM | POA: Diagnosis not present

## 2020-10-16 DIAGNOSIS — R2681 Unsteadiness on feet: Secondary | ICD-10-CM | POA: Diagnosis not present

## 2020-10-16 DIAGNOSIS — M6281 Muscle weakness (generalized): Secondary | ICD-10-CM | POA: Diagnosis not present

## 2020-10-19 DIAGNOSIS — M6281 Muscle weakness (generalized): Secondary | ICD-10-CM | POA: Diagnosis not present

## 2020-10-19 DIAGNOSIS — M25561 Pain in right knee: Secondary | ICD-10-CM | POA: Diagnosis not present

## 2020-10-19 DIAGNOSIS — R2681 Unsteadiness on feet: Secondary | ICD-10-CM | POA: Diagnosis not present

## 2020-10-21 DIAGNOSIS — M6281 Muscle weakness (generalized): Secondary | ICD-10-CM | POA: Diagnosis not present

## 2020-10-21 DIAGNOSIS — M25561 Pain in right knee: Secondary | ICD-10-CM | POA: Diagnosis not present

## 2020-10-21 DIAGNOSIS — R2681 Unsteadiness on feet: Secondary | ICD-10-CM | POA: Diagnosis not present

## 2020-10-22 DIAGNOSIS — F332 Major depressive disorder, recurrent severe without psychotic features: Secondary | ICD-10-CM | POA: Diagnosis not present

## 2020-10-22 DIAGNOSIS — Z8616 Personal history of COVID-19: Secondary | ICD-10-CM | POA: Diagnosis not present

## 2020-10-23 DIAGNOSIS — M6281 Muscle weakness (generalized): Secondary | ICD-10-CM | POA: Diagnosis not present

## 2020-10-23 DIAGNOSIS — R2681 Unsteadiness on feet: Secondary | ICD-10-CM | POA: Diagnosis not present

## 2020-10-23 DIAGNOSIS — M25561 Pain in right knee: Secondary | ICD-10-CM | POA: Diagnosis not present

## 2020-10-26 DIAGNOSIS — M25561 Pain in right knee: Secondary | ICD-10-CM | POA: Diagnosis not present

## 2020-10-26 DIAGNOSIS — M6281 Muscle weakness (generalized): Secondary | ICD-10-CM | POA: Diagnosis not present

## 2020-10-26 DIAGNOSIS — R2681 Unsteadiness on feet: Secondary | ICD-10-CM | POA: Diagnosis not present

## 2020-10-28 DIAGNOSIS — M25561 Pain in right knee: Secondary | ICD-10-CM | POA: Diagnosis not present

## 2020-10-28 DIAGNOSIS — M6281 Muscle weakness (generalized): Secondary | ICD-10-CM | POA: Diagnosis not present

## 2020-10-28 DIAGNOSIS — R2681 Unsteadiness on feet: Secondary | ICD-10-CM | POA: Diagnosis not present

## 2020-10-29 DIAGNOSIS — Z8616 Personal history of COVID-19: Secondary | ICD-10-CM | POA: Diagnosis not present

## 2020-11-02 DIAGNOSIS — R2681 Unsteadiness on feet: Secondary | ICD-10-CM | POA: Diagnosis not present

## 2020-11-02 DIAGNOSIS — M6281 Muscle weakness (generalized): Secondary | ICD-10-CM | POA: Diagnosis not present

## 2020-11-02 DIAGNOSIS — M25561 Pain in right knee: Secondary | ICD-10-CM | POA: Diagnosis not present

## 2020-11-04 DIAGNOSIS — R2681 Unsteadiness on feet: Secondary | ICD-10-CM | POA: Diagnosis not present

## 2020-11-04 DIAGNOSIS — R197 Diarrhea, unspecified: Secondary | ICD-10-CM | POA: Diagnosis not present

## 2020-11-04 DIAGNOSIS — M6281 Muscle weakness (generalized): Secondary | ICD-10-CM | POA: Diagnosis not present

## 2020-11-04 DIAGNOSIS — M25561 Pain in right knee: Secondary | ICD-10-CM | POA: Diagnosis not present

## 2020-11-05 DIAGNOSIS — F332 Major depressive disorder, recurrent severe without psychotic features: Secondary | ICD-10-CM | POA: Diagnosis not present

## 2020-11-09 ENCOUNTER — Other Ambulatory Visit: Payer: Self-pay | Admitting: Adult Health

## 2020-11-09 DIAGNOSIS — G250 Essential tremor: Secondary | ICD-10-CM

## 2020-11-09 DIAGNOSIS — R2681 Unsteadiness on feet: Secondary | ICD-10-CM | POA: Diagnosis not present

## 2020-11-09 DIAGNOSIS — M6281 Muscle weakness (generalized): Secondary | ICD-10-CM | POA: Diagnosis not present

## 2020-11-09 DIAGNOSIS — M25561 Pain in right knee: Secondary | ICD-10-CM | POA: Diagnosis not present

## 2020-11-11 DIAGNOSIS — M6281 Muscle weakness (generalized): Secondary | ICD-10-CM | POA: Diagnosis not present

## 2020-11-11 DIAGNOSIS — M25561 Pain in right knee: Secondary | ICD-10-CM | POA: Diagnosis not present

## 2020-11-11 DIAGNOSIS — R2681 Unsteadiness on feet: Secondary | ICD-10-CM | POA: Diagnosis not present

## 2020-11-16 DIAGNOSIS — M25561 Pain in right knee: Secondary | ICD-10-CM | POA: Diagnosis not present

## 2020-11-16 DIAGNOSIS — R2681 Unsteadiness on feet: Secondary | ICD-10-CM | POA: Diagnosis not present

## 2020-11-16 DIAGNOSIS — M6281 Muscle weakness (generalized): Secondary | ICD-10-CM | POA: Diagnosis not present

## 2020-11-18 DIAGNOSIS — R2681 Unsteadiness on feet: Secondary | ICD-10-CM | POA: Diagnosis not present

## 2020-11-18 DIAGNOSIS — M6281 Muscle weakness (generalized): Secondary | ICD-10-CM | POA: Diagnosis not present

## 2020-11-18 DIAGNOSIS — M25561 Pain in right knee: Secondary | ICD-10-CM | POA: Diagnosis not present

## 2020-11-20 DIAGNOSIS — R197 Diarrhea, unspecified: Secondary | ICD-10-CM | POA: Diagnosis not present

## 2020-11-20 DIAGNOSIS — Z1211 Encounter for screening for malignant neoplasm of colon: Secondary | ICD-10-CM | POA: Diagnosis not present

## 2020-11-23 DIAGNOSIS — M6281 Muscle weakness (generalized): Secondary | ICD-10-CM | POA: Diagnosis not present

## 2020-11-23 DIAGNOSIS — R2681 Unsteadiness on feet: Secondary | ICD-10-CM | POA: Diagnosis not present

## 2020-11-23 DIAGNOSIS — M25561 Pain in right knee: Secondary | ICD-10-CM | POA: Diagnosis not present

## 2020-11-25 DIAGNOSIS — K529 Noninfective gastroenteritis and colitis, unspecified: Secondary | ICD-10-CM | POA: Diagnosis not present

## 2020-11-25 DIAGNOSIS — M25561 Pain in right knee: Secondary | ICD-10-CM | POA: Diagnosis not present

## 2020-11-25 DIAGNOSIS — R2681 Unsteadiness on feet: Secondary | ICD-10-CM | POA: Diagnosis not present

## 2020-11-25 DIAGNOSIS — M6281 Muscle weakness (generalized): Secondary | ICD-10-CM | POA: Diagnosis not present

## 2020-11-27 DIAGNOSIS — M25562 Pain in left knee: Secondary | ICD-10-CM | POA: Diagnosis not present

## 2020-11-27 DIAGNOSIS — M25561 Pain in right knee: Secondary | ICD-10-CM | POA: Diagnosis not present

## 2020-11-30 DIAGNOSIS — M6281 Muscle weakness (generalized): Secondary | ICD-10-CM | POA: Diagnosis not present

## 2020-11-30 DIAGNOSIS — M25561 Pain in right knee: Secondary | ICD-10-CM | POA: Diagnosis not present

## 2020-11-30 DIAGNOSIS — R2681 Unsteadiness on feet: Secondary | ICD-10-CM | POA: Diagnosis not present

## 2020-12-03 ENCOUNTER — Ambulatory Visit: Payer: Medicare Other | Admitting: Neurology

## 2020-12-04 DIAGNOSIS — R509 Fever, unspecified: Secondary | ICD-10-CM | POA: Diagnosis not present

## 2020-12-04 DIAGNOSIS — R5383 Other fatigue: Secondary | ICD-10-CM | POA: Diagnosis not present

## 2020-12-04 DIAGNOSIS — R059 Cough, unspecified: Secondary | ICD-10-CM | POA: Diagnosis not present

## 2020-12-14 DIAGNOSIS — M25561 Pain in right knee: Secondary | ICD-10-CM | POA: Diagnosis not present

## 2020-12-14 DIAGNOSIS — R2681 Unsteadiness on feet: Secondary | ICD-10-CM | POA: Diagnosis not present

## 2020-12-14 DIAGNOSIS — M6281 Muscle weakness (generalized): Secondary | ICD-10-CM | POA: Diagnosis not present

## 2020-12-15 DIAGNOSIS — R7303 Prediabetes: Secondary | ICD-10-CM | POA: Diagnosis not present

## 2020-12-15 DIAGNOSIS — I10 Essential (primary) hypertension: Secondary | ICD-10-CM | POA: Diagnosis not present

## 2020-12-15 DIAGNOSIS — E7800 Pure hypercholesterolemia, unspecified: Secondary | ICD-10-CM | POA: Diagnosis not present

## 2020-12-15 DIAGNOSIS — E559 Vitamin D deficiency, unspecified: Secondary | ICD-10-CM | POA: Diagnosis not present

## 2020-12-15 DIAGNOSIS — Z6834 Body mass index (BMI) 34.0-34.9, adult: Secondary | ICD-10-CM | POA: Diagnosis not present

## 2020-12-15 DIAGNOSIS — A6002 Herpesviral infection of other male genital organs: Secondary | ICD-10-CM | POA: Diagnosis not present

## 2020-12-15 DIAGNOSIS — Z20822 Contact with and (suspected) exposure to covid-19: Secondary | ICD-10-CM | POA: Diagnosis not present

## 2020-12-17 ENCOUNTER — Other Ambulatory Visit: Payer: Self-pay

## 2020-12-17 ENCOUNTER — Encounter: Payer: Self-pay | Admitting: Neurology

## 2020-12-17 ENCOUNTER — Ambulatory Visit (INDEPENDENT_AMBULATORY_CARE_PROVIDER_SITE_OTHER): Payer: Medicare Other | Admitting: Neurology

## 2020-12-17 VITALS — BP 134/76 | HR 62 | Ht 67.0 in | Wt 205.0 lb

## 2020-12-17 DIAGNOSIS — Z820 Family history of epilepsy and other diseases of the nervous system: Secondary | ICD-10-CM | POA: Diagnosis not present

## 2020-12-17 DIAGNOSIS — R351 Nocturia: Secondary | ICD-10-CM | POA: Diagnosis not present

## 2020-12-17 DIAGNOSIS — R0683 Snoring: Secondary | ICD-10-CM | POA: Diagnosis not present

## 2020-12-17 DIAGNOSIS — F332 Major depressive disorder, recurrent severe without psychotic features: Secondary | ICD-10-CM | POA: Diagnosis not present

## 2020-12-17 DIAGNOSIS — E669 Obesity, unspecified: Secondary | ICD-10-CM | POA: Diagnosis not present

## 2020-12-17 DIAGNOSIS — G4719 Other hypersomnia: Secondary | ICD-10-CM

## 2020-12-17 DIAGNOSIS — Z8673 Personal history of transient ischemic attack (TIA), and cerebral infarction without residual deficits: Secondary | ICD-10-CM | POA: Diagnosis not present

## 2020-12-17 NOTE — Progress Notes (Signed)
Subjective:    Patient ID: [name] is a 70 y.o. male.  HPI    Star Age, MD, PhD [facility] [address], [address] [place], [place] [address]  Dear Mamie Nick,   I saw your patient, [name], upon your kind request in my sleep clinic today for initial consultation of his sleep disorder, in particular, concern for underlying obstructive sleep apnea.  The patient is unaccompanied today.  As you know, [name] is a 70 year old right-handed gentleman with an underlying medical history of stroke, hypertension, hyperlipidemia, diabetes, recurrent headaches, reflux  disease, depression, and obesity, who reports snoring and excessive  daytime somnolence.  He reports that his snoring tends to be less when he takes acid reflux medication.  Of note, he takes several medications that are psychotropic and several for sleep including Belsomra, trazodone and Seroquel.  He is not sure who prescribes these medications.  He is also on Mysoline twice daily. I reviewed your office note from 06/04/2020.  His Epworth sleepiness score is 10 out of 24, fatigue severity score is 22 out of 63.  He has a brother who has sleep apnea and uses a CPAP machine.  He has nocturia about once per average night and denies recurrent morning headaches.  Bedtime is generally around 6:30 PM or 7 PM and rise time as early as 4:30 AM for work.  He works part-time on [employer] course.  On Tuesdays he plays golf.  He drinks no caffeine currently, sometimes iced tea.  He is a non-smoker.  He is single and lives alone, he has no pets at the house.  He has a TV in his bedroom and occasionally leaves and on at night but does try to turn it off when he wakes up in the middle of the night.  His Past Medical History Is Significant For: Past Medical History:  Diagnosis Date   Depression    Frequent urination    GERD (gastroesophageal reflux disease)    Herpes    Hyperlipidemia    Hypertension     Memory loss    Obesity    Stroke [facility])    Urgency of urination     His Past Surgical History Is Significant For: Past Surgical History:  Procedure Laterality Date   CHOLECYSTECTOMY N/A 12/17/2019   Procedure: LAPAROSCOPIC CHOLECYSTECTOMY WITH ICG INJECTION;  Surgeon: Rolm Bookbinder, MD;  Location: [place];  Service: General;  Laterality: N/A;   disckec     SPINAL FUSION     VASECTOMY      His Family History Is Significant For: Family History  Problem Relation Age of Onset   Hypertension Mother    Hypertension Father    Hypertension Sister    Hypertension Brother    Cancer Maternal Aunt    Cancer - Ovarian Maternal Grandmother    Diabetes Mellitus I Paternal Grandmother    Hypertension Paternal Grandfather    Cancer Maternal Aunt     His Social History Is Significant For: Social History   Socioeconomic History   Marital status: Legally Separated    Spouse name: Not on file   Number of children: 1   Years of education: Masters   Highest education level: Not on file  Occupational History   Occupation: retired    Fish farm manager: RETIRED  Tobacco Use   Smoking status: Former   Smokeless tobacco: Never  Substance and Sexual Activity   Alcohol use: No    Alcohol/week: 1.0 standard drink  Types: 1 Cans of beer per week    Comment: socially   Drug use: No   Sexual activity: Never  Other Topics Concern   Not on file  Social History Narrative   Patient lives in [place] living @ [facility]   Patient is right-handed.   Patient does not drink any caffeine.   Social Determinants of Health   Financial Resource Strain: Not on file  Food Insecurity: Not on file  Transportation Needs: Not on file  Physical Activity: Not on file  Stress: Not on file  Social Connections: Not on file    His Allergies Are:  Allergies  Allergen Reactions   Aspirin Other (See Comments)    Dizziness, nausea and vomiting Balance issues   Lisinopril Other (See Comments)     Critical Cough   Lithium Other (See Comments)    Critical Per pt reaction unknown   Other Other (See Comments)   Oxycodone Nausea And Vomiting   Oxycodone Hcl Other (See Comments)   Oxycodone-Acetaminophen Nausea And Vomiting and Other (See Comments)    Also causes dizziness   Pork-Derived Products Other (See Comments)    Unspecified reaction  :   His Current Medications Are:  Outpatient Encounter Medications as of 12/17/2020  Medication Sig   amLODipine (NORVASC) 5 MG tablet Take 1 tablet (5 mg total) by mouth daily.   busPIRone (BUSPAR) 10 MG tablet TAKE 2 TABLETS BY MOUTH TWICE A DAY   candesartan (ATACAND) 32 MG tablet Take 32 mg by mouth daily.   clopidogrel (PLAVIX) 75 MG tablet Take 1 tablet (75 mg total) by mouth daily.   Diclofenac Sodium 3 % GEL as needed.   furosemide (LASIX) 40 MG tablet Take 1 tablet (40 mg total) by mouth 2 (two) times daily.   gabapentin (NEURONTIN) 400 MG capsule Take 400 mg by mouth in the morning and at bedtime.   meloxicam (MOBIC) 15 MG tablet Take 15 mg by mouth daily.   metoprolol succinate (TOPROL-XL) 25 MG 24 hr tablet Take 25 mg by mouth daily.    ondansetron (ZOFRAN) 4 MG tablet Take 1 tablet (4 mg total) by mouth every 8 (eight) hours as needed for nausea.   PARoxetine (PAXIL-CR) 37.5 MG 24 hr tablet Take 1 tablet (37.5 mg total) by mouth at bedtime.   primidone (MYSOLINE) 250 MG tablet TAKE 1 TABLET BY MOUTH TWICE A DAY   QUEtiapine (SEROQUEL) 25 MG tablet TAKE 3 TABLETS BY MOUTH AT BEDTIME   rosuvastatin (CRESTOR) 10 MG tablet Take 1 tablet (10 mg total) by mouth daily.   sildenafil (VIAGRA) 50 MG tablet 100 mg as needed.   spironolactone (ALDACTONE) 25 MG tablet Take 25 mg by mouth daily.   Suvorexant (BELSOMRA) 10 MG TABS Take 10 mg by mouth at bedtime.   traZODone (DESYREL) 150 MG tablet Take 1 tablet (150 mg total) by mouth at bedtime as needed. for sleep   valACYclovir (VALTREX) 1000 MG tablet Take 1,000 mg by mouth as needed  (herpes).   Vitamin D, Ergocalciferol, (DRISDOL) 1.25 MG (50000 UNIT) CAPS capsule Take by mouth once a week.   hydrALAZINE (APRESOLINE) 50 MG tablet Take 1 tablet (50 mg total) by mouth 2 (two) times daily.   No facility-administered encounter medications on file as of 12/17/2020.  :   Review of Systems:  Out of a complete 14 point review of systems, all are reviewed and negative with the exception of these symptoms as listed below:  Review of Systems  Neurological:        Patient presents today to discuss his sleep. Patient doesn't think he has had a sleep study. Pt does endorse snoring. He reports that if he takes his reflux medication he snores less.   Epworth Sleepiness Scale 0= would never doze 1= slight chance of dozing 2= moderate chance of dozing 3= high chance of dozing  Sitting and reading: 0 Watching TV: 2 Sitting inactive in a public place (ex. Theater or meeting): 1 As a passenger in a car for an hour without a break: 3 Lying down to rest in the afternoon: 3 Sitting and talking to someone: 0 Sitting quietly after lunch (no alcohol): 1 In a car, while stopped in traffic: 0 Total: 10    Objective:  Neurological Exam  Physical Exam Physical Examination:   Vitals:   12/17/20 1113  BP: 134/76  Pulse: 62    General Examination: The patient is a very pleasant 70 y.o. male in no acute distress. He appears well-developed and well-nourished and well groomed.   HEENT: Normocephalic, atraumatic, pupils are equal, round and reactive to light, extraocular tracking is good without limitation to gaze excursion or nystagmus noted. Hearing is grossly intact. Face is symmetric with normal facial animation. Speech is clear with no dysarthria noted. There is no hypophonia. There is no lip, neck/head, jaw or voice tremor. Neck is supple with full range of passive and active motion. There are no carotid bruits on auscultation. Oropharynx exam reveals: mild mouth dryness, adequate  dental hygiene with several teeth missing, mild airway crowding noted secondary to redundant soft palate along the uvula.  Tonsils are absent, Mallampati class II.  Neck circumference of 17 inches.  He has a mild overbite.  Tongue protrudes centrally and palate elevates symmetrically.    Chest: Clear to auscultation without wheezing, rhonchi or crackles noted.  Heart: S1+S2+0, regular and normal without murmurs, rubs or gallops noted.   Abdomen: Soft, non-tender and non-distended with normal bowel sounds appreciated on auscultation.  Extremities: There is trace pitting edema in the distal lower extremities bilaterally.   Skin: Warm and dry without trophic changes noted.   Musculoskeletal: exam reveals no obvious joint deformities.   Neurologically:  Mental status: The patient is awake, alert and oriented in all 4 spheres. His immediate and remote memory, attention, language skills and fund of knowledge are appropriate. There is no evidence of aphasia, agnosia, apraxia or anomia. Speech is clear with normal prosody and enunciation. Thought process is linear. Mood is normal and affect is normal.  Cranial nerves II - XII are as described above under HEENT exam.  Motor exam: Normal bulk, strength and tone is noted.  He has mild intermittent generalized dyskinesias.  There is no obvious hand tremor.  Fine motor skills and coordination: grossly intact.  Cerebellar testing: No dysmetria or intention tremor. There is no truncal or gait ataxia.  Sensory exam: intact to light touch in the upper and lower extremities.  Gait, station and balance: He stands easily. No veering to one side is noted. No leaning to one side is noted. Posture is age-appropriate and stance is narrow based. Gait shows normal stride length and normal pace. No problems turning are noted.   Assessment and Plan:  In summary, [name] is a very pleasant 70 y.o.-year old male with an underlying medical history of stroke,  hypertension, hyperlipidemia, diabetes, recurrent headaches, reflux  disease, depression, and obesity, whose history and physical exam concerning for obstructive sleep apnea (  OSA). I had a long chat with the patient about my findings and the diagnosis of OSA, its prognosis and treatment options. We talked about medical treatments, surgical interventions and non-pharmacological approaches. I explained in particular the risks and ramifications of untreated moderate to severe OSA, especially with respect to developing cardiovascular disease down the Road, including congestive heart failure, difficult to treat hypertension, cardiac arrhythmias, or stroke. Even type 2 diabetes has, in part, been linked to untreated OSA. Symptoms of untreated OSA include daytime sleepiness, memory problems, mood irritability and mood disorder such as depression and anxiety, lack of energy, as well as recurrent headaches, especially morning headaches. We talked about trying to maintain a healthy lifestyle in general, as well as the importance of weight control. We also talked about the importance of good sleep hygiene. I recommended the following at this time: sleep study.  I outlined the difference between a laboratory attended sleep study versus home sleep test.  I explained the sleep test procedure to the patient and also outlined possible surgical and non-surgical treatment options of OSA, including the use of a custom-made dental device (which would require a referral to a specialist dentist or oral surgeon), upper airway surgical options, such as traditional UPPP or a novel less invasive surgical option in the form of Inspire hypoglossal nerve stimulation (which would involve a referral to an ENT surgeon). I also explained the CPAP treatment option to the patient, who indicated that he would be reluctant but willing to try CPAP if the need arises.  We will pick up our discussion after testing and we will keep him posted as to  his test results by phone call as well.  We will plan to follow-up in sleep clinic accordingly.   I answered all his questions today and the patient  was in agreement.  Thank you very much for allowing me to participate in the care of this nice patient. If I can be of any further assistance to you please do not hesitate to talk to me.   Sincerely,   Star Age, MD, PhD

## 2020-12-17 NOTE — Patient Instructions (Signed)
It was nice to meet you today. Based on your symptoms and your exam I believe you are at risk for obstructive sleep apnea (aka OSA), and I think we should proceed with a sleep study to determine whether you do or do not have OSA and how severe it is. Even, if you have mild OSA, I may want you to consider treatment with CPAP, as treatment of even borderline or mild sleep apnea can result and improvement of symptoms such as sleep disruption, daytime sleepiness, nighttime bathroom breaks, restless leg symptoms, improvement of headache syndromes, even improved mood disorder.   As explained, an attended sleep study meaning you get to stay overnight in the sleep lab, lets us monitor sleep-related behaviors such as sleep talking and leg movements in sleep, in addition to monitoring for sleep apnea.  A home sleep test is a screening tool for sleep apnea only, and unfortunately does not help with any other sleep-related diagnoses.  Please remember, the long-term risks and ramifications of untreated moderate to severe obstructive sleep apnea are: increased Cardiovascular disease, including congestive heart failure, stroke, difficult to control hypertension, treatment resistant obesity, arrhythmias, especially irregular heartbeat commonly known as A. Fib. (atrial fibrillation); even type 2 diabetes has been linked to untreated OSA.   Sleep apnea can cause disruption of sleep and sleep deprivation in most cases, which, in turn, can cause recurrent headaches, problems with memory, mood, concentration, focus, and vigilance. Most people with untreated sleep apnea report excessive daytime sleepiness, which can affect their ability to drive. Please do not drive if you feel sleepy. Patients with sleep apnea can also develop difficulty initiating and maintaining sleep (aka insomnia).   Having sleep apnea may increase your risk for other sleep disorders, including involuntary behaviors sleep such as sleep terrors, sleep  talking, sleepwalking.    Having sleep apnea can also increase your risk for restless leg syndrome and leg movements at night.   Please note that untreated obstructive sleep apnea may carry additional perioperative morbidity. Patients with significant obstructive sleep apnea (typically, in the moderate to severe degree) should receive, if possible, perioperative PAP (positive airway pressure) therapy and the surgeons and particularly the anesthesiologists should be informed of the diagnosis and the severity of the sleep disordered breathing.   I will likely see you back after your sleep study to go over the test results and where to go from there. We will call you after your sleep study to advise about the results (most likely, you will hear from Megan, my nurse) and to set up an appointment at the time, as necessary.    Our sleep lab administrative assistant will call you to schedule your sleep study and give you further instructions, regarding the check in process for the sleep study, arrival time, what to bring, when you can expect to leave after the study, etc., and to answer any other logistical questions you may have. If you don't hear back from her by about 2 weeks from now, please feel free to call her direct line at [phone] or you can call our general clinic number, or email us through My Chart.    

## 2020-12-24 DIAGNOSIS — N183 Chronic kidney disease, stage 3 unspecified: Secondary | ICD-10-CM | POA: Diagnosis not present

## 2020-12-24 DIAGNOSIS — R2681 Unsteadiness on feet: Secondary | ICD-10-CM | POA: Diagnosis not present

## 2020-12-24 DIAGNOSIS — M25561 Pain in right knee: Secondary | ICD-10-CM | POA: Diagnosis not present

## 2020-12-24 DIAGNOSIS — M6281 Muscle weakness (generalized): Secondary | ICD-10-CM | POA: Diagnosis not present

## 2020-12-29 ENCOUNTER — Telehealth (INDEPENDENT_AMBULATORY_CARE_PROVIDER_SITE_OTHER): Payer: Medicare Other | Admitting: Psychiatry

## 2020-12-29 ENCOUNTER — Encounter (HOSPITAL_COMMUNITY): Payer: Self-pay | Admitting: Psychiatry

## 2020-12-29 DIAGNOSIS — M6281 Muscle weakness (generalized): Secondary | ICD-10-CM | POA: Diagnosis not present

## 2020-12-29 DIAGNOSIS — M25561 Pain in right knee: Secondary | ICD-10-CM | POA: Diagnosis not present

## 2020-12-29 DIAGNOSIS — F3342 Major depressive disorder, recurrent, in full remission: Secondary | ICD-10-CM | POA: Diagnosis not present

## 2020-12-29 DIAGNOSIS — F419 Anxiety disorder, unspecified: Secondary | ICD-10-CM | POA: Diagnosis not present

## 2020-12-29 DIAGNOSIS — R2681 Unsteadiness on feet: Secondary | ICD-10-CM | POA: Diagnosis not present

## 2020-12-29 MED ORDER — BUSPIRONE HCL 10 MG PO TABS: ORAL_TABLET | ORAL | 3 refills | Status: DC

## 2020-12-29 MED ORDER — QUETIAPINE FUMARATE 25 MG PO TABS: ORAL_TABLET | ORAL | 3 refills | Status: DC

## 2020-12-29 MED ORDER — PAROXETINE HCL ER 37.5 MG PO TB24: 37.5000 mg | ORAL_TABLET | Freq: Every day | ORAL | 3 refills | Status: DC

## 2020-12-29 MED ORDER — TRAZODONE HCL 150 MG PO TABS: 150.0000 mg | ORAL_TABLET | Freq: Every evening | ORAL | 3 refills | Status: DC | PRN

## 2020-12-29 NOTE — Progress Notes (Addendum)
[place] MD/PA/NP OP Progress Note Virtual Visit via Telephone Note  I connected with [name] on 12/29/20 at 10:00 AM EST by telephone and verified that I am speaking with the correct person using two identifiers.  Location: Patient: home Provider: Home office   I discussed the limitations, risks, security and privacy concerns of performing an evaluation and management service by telephone and the availability of in person appointments. I also discussed with the patient that there may be a patient responsible charge related to this service. The patient expressed understanding and agreed to proceed.   I provided 30 minutes of non-face-to-face time during this encounter.  12/29/2020 9:41 AM [name]  MRN:  [id number]  Chief Complaint: "Mentally and emotionally Im fine but my girlfriend irritates me"   HPI: 70 year old male seen today for follow-up psychiatric evaluation.  He has a psychiatric history of depression, anxiety, and TD.  He is currently managed on BuSpar 10 mg twice daily,  Paxil 37.5 mg daily, Seroquel 25 mg nightly, and trazodone 150 mg nightly as needed.  He notes his medications are effective in managing his psychiatric conditions.  Today he was unable to login virtually so his assessment was done over the phone. During exam he was pleasant, cooperative, and engaged in conversation.  He informed Probation officer that since his last visit he has been doing well mentally but notes that he has been irritated with his girlfriend. He reports that she is jealous of his relationship with his male peers. Besides his girlfriend he notes that other aspects of his life is going well. He reports that he still works at [employer] course and finds enjoyment in his job. He reports that recently got a raise.   He notes that he has minimal anxiety and depression.  Today provider conducted a GAD-7 and patient scored a 1, at his last visit he scored a 0.  Provider also conducted a PHQ-9 and patient  scored a 2, at his last visit he scored a scored a 4.  He endorses adequate sleep and appetite. He reports that he has lost 5-10 pounds since his last visit.   He denies SI/HI/VAH, mania, paranoia.  Patient noted to have he continues to have right knee pain which he quantifies as 8/10. He notes that he will have surgery on his knee on 01/08/2021.  He reports that he uses extra strength tylenol to help manage his current pain.    No medication changes made today.  He will continue all other medications as prescribed. Provider recommended couples therapy to help manage relationship stressors. No other concerns noted at his time. Visit Diagnosis:    ICD-10-CM   1. Anxiety  F41.9 busPIRone (BUSPAR) 10 MG tablet    2. Recurrent major depressive disorder, in full remission ([place])  F33.42 PARoxetine (PAXIL-CR) 37.5 MG 24 hr tablet    QUEtiapine (SEROQUEL) 25 MG tablet    traZODone (DESYREL) 150 MG tablet      Past Psychiatric History:  MDD, TD, anxiety  Past Medical History:  Past Medical History:  Diagnosis Date   Depression    Frequent urination    GERD (gastroesophageal reflux disease)    Herpes    Hyperlipidemia    Hypertension    Memory loss    Obesity    Stroke [facility])    Urgency of urination     Past Surgical History:  Procedure Laterality Date   CHOLECYSTECTOMY N/A 12/17/2019   Procedure: LAPAROSCOPIC CHOLECYSTECTOMY WITH ICG INJECTION;  Surgeon: Rolm Bookbinder, MD;  Location: [place];  Service: General;  Laterality: N/A;   disckec     SPINAL FUSION     VASECTOMY      Family Psychiatric History: Denies  Family History:  Family History  Problem Relation Age of Onset   Hypertension Mother    Hypertension Father    Hypertension Sister    Hypertension Brother    Cancer Maternal Aunt    Cancer - Ovarian Maternal Grandmother    Diabetes Mellitus I Paternal Grandmother    Hypertension Paternal Grandfather    Cancer Maternal Aunt     Social History:  Social History    Socioeconomic History   Marital status: Legally Separated    Spouse name: Not on file   Number of children: 1   Years of education: Masters   Highest education level: Not on file  Occupational History   Occupation: retired    Fish farm manager: RETIRED  Tobacco Use   Smoking status: Former   Smokeless tobacco: Never  Substance and Sexual Activity   Alcohol use: No    Alcohol/week: 1.0 standard drink    Types: 1 Cans of beer per week    Comment: socially   Drug use: No   Sexual activity: Never  Other Topics Concern   Not on file  Social History Narrative   Patient lives in [place] [facility] @ [place]   Patient is right-handed.   Patient does not drink any caffeine.   Social Determinants of Health   Financial Resource Strain: Not on file  Food Insecurity: Not on file  Transportation Needs: Not on file  Physical Activity: Not on file  Stress: Not on file  Social Connections: Not on file    Allergies:  Allergies  Allergen Reactions   Aspirin Other (See Comments)    Dizziness, nausea and vomiting Balance issues   Lisinopril Other (See Comments)    Critical Cough   Lithium Other (See Comments)    Critical Per pt reaction unknown   Other Other (See Comments)   Oxycodone Nausea And Vomiting   Oxycodone Hcl Other (See Comments)   Oxycodone-Acetaminophen Nausea And Vomiting and Other (See Comments)    Also causes dizziness   Pork-Derived Products Other (See Comments)    Unspecified reaction    Metabolic Disorder Labs: Lab Results  Component Value Date   HGBA1C 5.9 (H) 06/04/2020   MPG 114.02 12/17/2019   MPG 120 (H) 12/20/2009   No results found for: PROLACTIN Lab Results  Component Value Date   CHOL 140 06/04/2020   TRIG 106 06/04/2020   HDL 35 (L) 06/04/2020   CHOLHDL 4.0 06/04/2020   VLDL 36 12/17/2019   LDLCALC 85 06/04/2020   LDLCALC 109 (H) 12/17/2019   Lab Results  Component Value Date   TSH 2.605 12/15/2019   TSH 2.850 10/16/2013     Therapeutic Level Labs: No results found for: LITHIUM No results found for: VALPROATE No components found for:  CBMZ  Current Medications: Current Outpatient Medications  Medication Sig Dispense Refill   amLODipine (NORVASC) 5 MG tablet Take 1 tablet (5 mg total) by mouth daily. 90 tablet 3   busPIRone (BUSPAR) 10 MG tablet TAKE 2 TABLETS BY MOUTH TWICE A DAY 120 tablet 3   candesartan (ATACAND) 32 MG tablet Take 32 mg by mouth daily.     clopidogrel (PLAVIX) 75 MG tablet Take 1 tablet (75 mg total) by mouth daily. 30 tablet 11   Diclofenac Sodium 3 %  GEL as needed.     furosemide (LASIX) 40 MG tablet Take 1 tablet (40 mg total) by mouth 2 (two) times daily. 180 tablet 3   gabapentin (NEURONTIN) 400 MG capsule Take 400 mg by mouth in the morning and at bedtime.     hydrALAZINE (APRESOLINE) 50 MG tablet Take 1 tablet (50 mg total) by mouth 2 (two) times daily. 270 tablet 3   meloxicam (MOBIC) 15 MG tablet Take 15 mg by mouth daily.     metoprolol succinate (TOPROL-XL) 25 MG 24 hr tablet Take 25 mg by mouth daily.      ondansetron (ZOFRAN) 4 MG tablet Take 1 tablet (4 mg total) by mouth every 8 (eight) hours as needed for nausea. 8 tablet 5   PARoxetine (PAXIL-CR) 37.5 MG 24 hr tablet Take 1 tablet (37.5 mg total) by mouth at bedtime. 30 tablet 3   primidone (MYSOLINE) 250 MG tablet TAKE 1 TABLET BY MOUTH TWICE A DAY 60 tablet 12   QUEtiapine (SEROQUEL) 25 MG tablet TAKE 3 TABLETS BY MOUTH AT BEDTIME 90 tablet 3   rosuvastatin (CRESTOR) 10 MG tablet Take 1 tablet (10 mg total) by mouth daily. 90 tablet 3   sildenafil (VIAGRA) 50 MG tablet 100 mg as needed.     spironolactone (ALDACTONE) 25 MG tablet Take 25 mg by mouth daily.     Suvorexant (BELSOMRA) 10 MG TABS Take 10 mg by mouth at bedtime. 30 tablet 2   traZODone (DESYREL) 150 MG tablet Take 1 tablet (150 mg total) by mouth at bedtime as needed. for sleep 30 tablet 3   valACYclovir (VALTREX) 1000 MG tablet Take 1,000 mg by mouth as  needed (herpes).     Vitamin D, Ergocalciferol, (DRISDOL) 1.25 MG (50000 UNIT) CAPS capsule Take by mouth once a week.     No current facility-administered medications for this visit.     Musculoskeletal: Strength & Muscle Tone:  Unable to assess due to telephone visit Gait & Station:  Unable to assess due to telephone visit Patient leans: N/A  Psychiatric Specialty Exam: Review of Systems  There were no vitals taken for this visit.There is no height or weight on file to calculate BMI.  General Appearance: Unable to assess due to telephone visit  Eye Contact:   Unable to assess due to telephone visit  Speech:  Clear and Coherent and Normal Rate  Volume:  Normal  Mood:  Euthymic  Affect:  Appropriate and Congruent  Thought Process:  Coherent, Goal Directed, and Linear  Orientation:  Full (Time, Place, and Person)  Thought Content: WDL and Logical   Suicidal Thoughts:  No  Homicidal Thoughts:  No  Memory:  Immediate;   Good Recent;   Good Remote;   Good  Judgement:  Good  Insight:  Good  Psychomotor Activity:   Unable to assess due to telephone visit  Concentration:  Concentration: Good and Attention Span: Good  Recall:  Good  Fund of Knowledge: Good  Language: Good  Akathisia:   Unable to assess due to telephone visit  Handed:  Right  AIMS (if indicated): not done  Assets:  Communication Skills Desire for Improvement Financial Resources/Insurance Housing Intimacy Physical Health Social Support  ADL's:  Intact  Cognition: WNL  Sleep:  Good   Screenings: AIMS    Flowsheet Row Clinical Support from 09/24/2020 in [facility] Admission (Discharged) from OP Visit from 02/02/2019 in [place] Total Score 1 12  AUDIT    Flowsheet Row Admission (Discharged) from OP Visit from 02/02/2019 in [facility]  Alcohol Use Disorder Identification Test Final Score (AUDIT) 0      GAD-7     Flowsheet Row Video Visit from 12/29/2020 in [facility] Clinical Support from 09/24/2020 in [facility]  Total GAD-7 Score 1 [place] Office Visit from 01/09/2020 in [facility] Office Visit from 03/25/2019 in [facility] Office Visit from 09/12/2018 in [facility] Office Visit from 03/12/2018 in [facility] Office Visit from 09/26/2017 in [facility]  Total Score (max 30 points ) 27 24 24 28 28       PHQ2-9    Flowsheet Row Video Visit from 12/29/2020 in [place] from 09/24/2020 in [facility]  PHQ-2 Total Score 0 0  PHQ-9 Total Score 2 4      Flowsheet Row ED from 03/24/2020 in [facility] Admission (Discharged) from OP Visit from 02/02/2019 in [place] No Risk Error: Question 1 not populated        Assessment and Plan: Patient notes that he is doing well  mentally however notes that he has some life stressors (relationship). He however reported that he is able to cope with it. Provider recommended couples therapy.  No medication changes made today.  Patient agreeable to continue medications as prescribed.  1. Anxiety  Continue- busPIRone (BUSPAR) 10 MG tablet; TAKE 2 TABLETS BY MOUTH TWICE A DAY  Dispense: 120 tablet; Refill: 3  2. Recurrent major depressive disorder, in full remission ([place])  Continue- traZODone (DESYREL) 150 MG tablet; Take 1 tablet (150 mg total) by mouth at bedtime as needed. for sleep  Dispense: 30 tablet; Refill: 3 Continue- QUEtiapine (SEROQUEL) 25 MG tablet; TAKE 3 TABLETS BY MOUTH AT BEDTIME  Dispense: 90 tablet; Refill: 3 Continue- PARoxetine (PAXIL-CR) 37.5 MG 24 hr tablet; Take 1 tablet (37.5 mg total) by mouth at bedtime.   Dispense: 30 tablet; Refill: 3 Continue- Suvorexant (BELSOMRA) 10 MG TABS; Take 10 mg by mouth at bedtime.  Dispense: 30 tablet; Refill: 2  Follow-up in 3 months   Salley Slaughter, NP 12/29/2020, 9:41 AM

## 2020-12-30 ENCOUNTER — Encounter (HOSPITAL_COMMUNITY): Payer: Medicare Other | Admitting: Psychiatry

## 2020-12-31 DIAGNOSIS — M6281 Muscle weakness (generalized): Secondary | ICD-10-CM | POA: Diagnosis not present

## 2020-12-31 DIAGNOSIS — F332 Major depressive disorder, recurrent severe without psychotic features: Secondary | ICD-10-CM | POA: Diagnosis not present

## 2020-12-31 DIAGNOSIS — R2681 Unsteadiness on feet: Secondary | ICD-10-CM | POA: Diagnosis not present

## 2020-12-31 DIAGNOSIS — M25561 Pain in right knee: Secondary | ICD-10-CM | POA: Diagnosis not present

## 2021-01-04 ENCOUNTER — Other Ambulatory Visit (HOSPITAL_COMMUNITY): Payer: Self-pay | Admitting: Psychiatry

## 2021-01-04 DIAGNOSIS — F3342 Major depressive disorder, recurrent, in full remission: Secondary | ICD-10-CM

## 2021-01-06 DIAGNOSIS — R2681 Unsteadiness on feet: Secondary | ICD-10-CM | POA: Diagnosis not present

## 2021-01-06 DIAGNOSIS — M6281 Muscle weakness (generalized): Secondary | ICD-10-CM | POA: Diagnosis not present

## 2021-01-06 DIAGNOSIS — M25561 Pain in right knee: Secondary | ICD-10-CM | POA: Diagnosis not present

## 2021-01-07 DIAGNOSIS — R310 Gross hematuria: Secondary | ICD-10-CM | POA: Diagnosis not present

## 2021-01-08 DIAGNOSIS — M170 Bilateral primary osteoarthritis of knee: Secondary | ICD-10-CM | POA: Diagnosis not present

## 2021-01-12 DIAGNOSIS — M6281 Muscle weakness (generalized): Secondary | ICD-10-CM | POA: Diagnosis not present

## 2021-01-12 DIAGNOSIS — R2681 Unsteadiness on feet: Secondary | ICD-10-CM | POA: Diagnosis not present

## 2021-01-12 DIAGNOSIS — M25561 Pain in right knee: Secondary | ICD-10-CM | POA: Diagnosis not present

## 2021-01-12 DIAGNOSIS — Z20822 Contact with and (suspected) exposure to covid-19: Secondary | ICD-10-CM | POA: Diagnosis not present

## 2021-01-14 DIAGNOSIS — R918 Other nonspecific abnormal finding of lung field: Secondary | ICD-10-CM | POA: Diagnosis not present

## 2021-01-17 DIAGNOSIS — J209 Acute bronchitis, unspecified: Secondary | ICD-10-CM | POA: Diagnosis not present

## 2021-01-19 DIAGNOSIS — Z20822 Contact with and (suspected) exposure to covid-19: Secondary | ICD-10-CM | POA: Diagnosis not present

## 2021-01-20 DIAGNOSIS — F332 Major depressive disorder, recurrent severe without psychotic features: Secondary | ICD-10-CM | POA: Diagnosis not present

## 2021-01-21 ENCOUNTER — Telehealth: Payer: Self-pay | Admitting: Neurology

## 2021-01-21 DIAGNOSIS — R2681 Unsteadiness on feet: Secondary | ICD-10-CM | POA: Diagnosis not present

## 2021-01-21 DIAGNOSIS — M6281 Muscle weakness (generalized): Secondary | ICD-10-CM | POA: Diagnosis not present

## 2021-01-21 DIAGNOSIS — J111 Influenza due to unidentified influenza virus with other respiratory manifestations: Secondary | ICD-10-CM | POA: Diagnosis not present

## 2021-01-21 DIAGNOSIS — M25561 Pain in right knee: Secondary | ICD-10-CM | POA: Diagnosis not present

## 2021-01-21 DIAGNOSIS — R059 Cough, unspecified: Secondary | ICD-10-CM | POA: Diagnosis not present

## 2021-01-21 DIAGNOSIS — U071 COVID-19: Secondary | ICD-10-CM | POA: Diagnosis not present

## 2021-01-21 NOTE — Telephone Encounter (Signed)
Pt. No showed NPSG appt on 1/2. Pt was called to ask if he wanted to r/s but pt states he has covid and would like to call back when he feels better.

## 2021-01-22 ENCOUNTER — Telehealth: Payer: Self-pay | Admitting: *Deleted

## 2021-01-22 NOTE — Telephone Encounter (Signed)
Surgery scheduled for 05/24/2021.  Patient will need preoperative cardiac follow-up appointment prior to surgery.  Primary Cardiologist:Mahesh A Chandrasekhar, MD  Chart reviewed as part of pre-operative protocol coverage. Because of [name]'s past medical history and time since last visit, he/she will require a follow-up visit in order to better assess preoperative cardiovascular risk.  Pre-op covering staff: - Please schedule appointment and call patient to inform them. - Please contact requesting surgeon's office via preferred method (i.e, phone, fax) to inform them of need for appointment prior to surgery.  If applicable, this message will also be routed to pharmacy pool and/or primary cardiologist for input on holding anticoagulant/antiplatelet agent as requested below so that this information is available at time of patient's appointment.   [name], NP  01/22/2021, 2:29 PM

## 2021-01-22 NOTE — Telephone Encounter (Signed)
° °  Pre-operative Risk Assessment    Patient Name: [name]  DOB: [birth year] MRN: [id number]      Request for Surgical Clearance    Procedure:   RIGHT TOTAL KNEE ARTHROPLASTY  Date of Surgery:  Clearance 05/24/21                                 Surgeon:  DR. Gaynelle Arabian Surgeon's Group or Practice Name:  [facility] Phone number:  [phone] Fax number:  [phone] ATTN: [place] [facility]   Type of Clearance Requested:   - Medical  - Pharmacy:  Hold Clopidogrel (Plavix)     Type of Anesthesia:   CHOICE   Additional requests/questions:    Jiles Prows   01/22/2021, 2:14 PM

## 2021-01-22 NOTE — Telephone Encounter (Signed)
Pt has appt with Dr. Gasper Sells 04/09/21. I have added pre op clearance needed to appt notes. Will forward notes to MD for upcoming appt. Will send FYI to requesting office pt has appt 04/09/21.

## 2021-01-26 DIAGNOSIS — R2681 Unsteadiness on feet: Secondary | ICD-10-CM | POA: Diagnosis not present

## 2021-01-26 DIAGNOSIS — Z20822 Contact with and (suspected) exposure to covid-19: Secondary | ICD-10-CM | POA: Diagnosis not present

## 2021-01-26 DIAGNOSIS — M25561 Pain in right knee: Secondary | ICD-10-CM | POA: Diagnosis not present

## 2021-01-26 DIAGNOSIS — M6281 Muscle weakness (generalized): Secondary | ICD-10-CM | POA: Diagnosis not present

## 2021-01-29 DIAGNOSIS — M25561 Pain in right knee: Secondary | ICD-10-CM | POA: Diagnosis not present

## 2021-01-29 DIAGNOSIS — R2681 Unsteadiness on feet: Secondary | ICD-10-CM | POA: Diagnosis not present

## 2021-01-29 DIAGNOSIS — M6281 Muscle weakness (generalized): Secondary | ICD-10-CM | POA: Diagnosis not present

## 2021-02-02 DIAGNOSIS — M6281 Muscle weakness (generalized): Secondary | ICD-10-CM | POA: Diagnosis not present

## 2021-02-02 DIAGNOSIS — R2681 Unsteadiness on feet: Secondary | ICD-10-CM | POA: Diagnosis not present

## 2021-02-02 DIAGNOSIS — M25561 Pain in right knee: Secondary | ICD-10-CM | POA: Diagnosis not present

## 2021-02-02 DIAGNOSIS — Z20822 Contact with and (suspected) exposure to covid-19: Secondary | ICD-10-CM | POA: Diagnosis not present

## 2021-02-04 DIAGNOSIS — M25561 Pain in right knee: Secondary | ICD-10-CM | POA: Diagnosis not present

## 2021-02-04 DIAGNOSIS — Z20822 Contact with and (suspected) exposure to covid-19: Secondary | ICD-10-CM | POA: Diagnosis not present

## 2021-02-04 DIAGNOSIS — R2681 Unsteadiness on feet: Secondary | ICD-10-CM | POA: Diagnosis not present

## 2021-02-04 DIAGNOSIS — M6281 Muscle weakness (generalized): Secondary | ICD-10-CM | POA: Diagnosis not present

## 2021-02-10 DIAGNOSIS — M25561 Pain in right knee: Secondary | ICD-10-CM | POA: Diagnosis not present

## 2021-02-10 DIAGNOSIS — R2681 Unsteadiness on feet: Secondary | ICD-10-CM | POA: Diagnosis not present

## 2021-02-10 DIAGNOSIS — M6281 Muscle weakness (generalized): Secondary | ICD-10-CM | POA: Diagnosis not present

## 2021-02-11 DIAGNOSIS — M25561 Pain in right knee: Secondary | ICD-10-CM | POA: Diagnosis not present

## 2021-02-11 DIAGNOSIS — M6281 Muscle weakness (generalized): Secondary | ICD-10-CM | POA: Diagnosis not present

## 2021-02-11 DIAGNOSIS — R2681 Unsteadiness on feet: Secondary | ICD-10-CM | POA: Diagnosis not present

## 2021-02-16 DIAGNOSIS — M25561 Pain in right knee: Secondary | ICD-10-CM | POA: Diagnosis not present

## 2021-02-16 DIAGNOSIS — M6281 Muscle weakness (generalized): Secondary | ICD-10-CM | POA: Diagnosis not present

## 2021-02-16 DIAGNOSIS — R2681 Unsteadiness on feet: Secondary | ICD-10-CM | POA: Diagnosis not present

## 2021-02-18 DIAGNOSIS — R2681 Unsteadiness on feet: Secondary | ICD-10-CM | POA: Diagnosis not present

## 2021-02-18 DIAGNOSIS — M25561 Pain in right knee: Secondary | ICD-10-CM | POA: Diagnosis not present

## 2021-02-18 DIAGNOSIS — M6281 Muscle weakness (generalized): Secondary | ICD-10-CM | POA: Diagnosis not present

## 2021-02-23 DIAGNOSIS — M25561 Pain in right knee: Secondary | ICD-10-CM | POA: Diagnosis not present

## 2021-02-23 DIAGNOSIS — R2681 Unsteadiness on feet: Secondary | ICD-10-CM | POA: Diagnosis not present

## 2021-02-23 DIAGNOSIS — M6281 Muscle weakness (generalized): Secondary | ICD-10-CM | POA: Diagnosis not present

## 2021-02-25 DIAGNOSIS — M6281 Muscle weakness (generalized): Secondary | ICD-10-CM | POA: Diagnosis not present

## 2021-02-25 DIAGNOSIS — M25561 Pain in right knee: Secondary | ICD-10-CM | POA: Diagnosis not present

## 2021-02-25 DIAGNOSIS — R2681 Unsteadiness on feet: Secondary | ICD-10-CM | POA: Diagnosis not present

## 2021-03-03 DIAGNOSIS — M6281 Muscle weakness (generalized): Secondary | ICD-10-CM | POA: Diagnosis not present

## 2021-03-03 DIAGNOSIS — M25561 Pain in right knee: Secondary | ICD-10-CM | POA: Diagnosis not present

## 2021-03-03 DIAGNOSIS — R2681 Unsteadiness on feet: Secondary | ICD-10-CM | POA: Diagnosis not present

## 2021-03-09 DIAGNOSIS — R2681 Unsteadiness on feet: Secondary | ICD-10-CM | POA: Diagnosis not present

## 2021-03-09 DIAGNOSIS — M25561 Pain in right knee: Secondary | ICD-10-CM | POA: Diagnosis not present

## 2021-03-09 DIAGNOSIS — M6281 Muscle weakness (generalized): Secondary | ICD-10-CM | POA: Diagnosis not present

## 2021-03-16 DIAGNOSIS — M25561 Pain in right knee: Secondary | ICD-10-CM | POA: Diagnosis not present

## 2021-03-16 DIAGNOSIS — R2681 Unsteadiness on feet: Secondary | ICD-10-CM | POA: Diagnosis not present

## 2021-03-16 DIAGNOSIS — M6281 Muscle weakness (generalized): Secondary | ICD-10-CM | POA: Diagnosis not present

## 2021-03-18 ENCOUNTER — Encounter (HOSPITAL_COMMUNITY): Payer: Medicare Other | Admitting: Psychiatry

## 2021-03-23 DIAGNOSIS — E7800 Pure hypercholesterolemia, unspecified: Secondary | ICD-10-CM | POA: Diagnosis not present

## 2021-03-23 DIAGNOSIS — E559 Vitamin D deficiency, unspecified: Secondary | ICD-10-CM | POA: Diagnosis not present

## 2021-03-23 DIAGNOSIS — R7303 Prediabetes: Secondary | ICD-10-CM | POA: Diagnosis not present

## 2021-03-23 DIAGNOSIS — Z6833 Body mass index (BMI) 33.0-33.9, adult: Secondary | ICD-10-CM | POA: Diagnosis not present

## 2021-03-23 DIAGNOSIS — I10 Essential (primary) hypertension: Secondary | ICD-10-CM | POA: Diagnosis not present

## 2021-03-26 DIAGNOSIS — M25561 Pain in right knee: Secondary | ICD-10-CM | POA: Diagnosis not present

## 2021-03-26 DIAGNOSIS — R2681 Unsteadiness on feet: Secondary | ICD-10-CM | POA: Diagnosis not present

## 2021-03-26 DIAGNOSIS — M6281 Muscle weakness (generalized): Secondary | ICD-10-CM | POA: Diagnosis not present

## 2021-03-29 ENCOUNTER — Ambulatory Visit (INDEPENDENT_AMBULATORY_CARE_PROVIDER_SITE_OTHER): Payer: Medicare Other | Admitting: Psychiatry

## 2021-03-29 DIAGNOSIS — F419 Anxiety disorder, unspecified: Secondary | ICD-10-CM

## 2021-03-29 DIAGNOSIS — F3342 Major depressive disorder, recurrent, in full remission: Secondary | ICD-10-CM

## 2021-03-29 MED ORDER — PAROXETINE HCL ER 37.5 MG PO TB24: 37.5000 mg | ORAL_TABLET | Freq: Every day | ORAL | 2 refills | Status: DC

## 2021-03-29 MED ORDER — QUETIAPINE FUMARATE 25 MG PO TABS: ORAL_TABLET | ORAL | 2 refills | Status: DC

## 2021-03-29 MED ORDER — BELSOMRA 10 MG PO TABS: 1.0000 | ORAL_TABLET | Freq: Every day | ORAL | 2 refills | Status: DC

## 2021-03-29 MED ORDER — TRAZODONE HCL 150 MG PO TABS: 150.0000 mg | ORAL_TABLET | Freq: Every evening | ORAL | 2 refills | Status: DC | PRN

## 2021-03-29 MED ORDER — BUSPIRONE HCL 10 MG PO TABS: ORAL_TABLET | ORAL | 2 refills | Status: DC

## 2021-03-29 NOTE — Progress Notes (Signed)
[place] MD/PA/NP OP Progress Note  03/29/2021 3:23 PM [name]  MRN:  [id number]  Virtual Visit via Telephone Note  I connected with [name] on 03/29/21 at  3:00 PM EDT by telephone and verified that I am speaking with the correct person using two identifiers.  Location: Patient: home Provider: off site   I discussed the limitations, risks, security and privacy concerns of performing an evaluation and management service by telephone and the availability of in person appointments. I also discussed with the patient that there may be a patient responsible charge related to this service. The patient expressed understanding and agreed to proceed.    I discussed the assessment and treatment plan with the patient. The patient was provided an opportunity to ask questions and all were answered. The patient agreed with the plan and demonstrated an understanding of the instructions.   The patient was advised to call back or seek an in-person evaluation if the symptoms worsen or if the condition fails to improve as anticipated.  I provided 15 minutes of non-face-to-face time during this encounter.   Franne Grip, NP   Chief Complaint: My mood is very good.  HPI: [name] is a 71 year old male presenting to [facility] for a follow up psychiatric evaluation. He has a psychiatric history of anxiety and major depressive disorder. His symptoms are managed with Buspar 10 mg twice daily, Paxil 37.5 mg at bedtime, Seroquel 25 mg 3 tablets at bedtime, Trazodone 150 mg at bedtime, and Belsomra 10 mg at bedtime.  Patient reports medication compliance and denies adverse reactions.  He denies the need for dose adjustment today. Patient is alert and oriented x4, calm, pleasant and willing to engage.  He reports good mood, sleep, and appetite.  Patient denies suicidal ideations or homicidal ideations, paranoia, delusional thought, auditory or visual  hallucinations.  Visit Diagnosis:    ICD-10-CM   1. Anxiety  F41.9     2. Recurrent major depressive disorder, in full remission ([place])  F33.42     ,   Past Psychiatric History: Major depressive disorder and anxiety   Past Medical History:  Past Medical History:  Diagnosis Date   Depression    Frequent urination    GERD (gastroesophageal reflux disease)    Herpes    Hyperlipidemia    Hypertension    Memory loss    Obesity    Stroke [facility])    Urgency of urination     Past Surgical History:  Procedure Laterality Date   CHOLECYSTECTOMY N/A 12/17/2019   Procedure: LAPAROSCOPIC CHOLECYSTECTOMY WITH ICG INJECTION;  Surgeon: Rolm Bookbinder, MD;  Location: [place];  Service: General;  Laterality: N/A;   disckec     SPINAL FUSION     VASECTOMY      Family Psychiatric History: None known  Family History:  Family History  Problem Relation Age of Onset   Hypertension Mother    Hypertension Father    Hypertension Sister    Hypertension Brother    Cancer Maternal Aunt    Cancer - Ovarian Maternal Grandmother    Diabetes Mellitus I Paternal Grandmother    Hypertension Paternal Grandfather    Cancer Maternal Aunt     Social History:  Social History   Socioeconomic History   Marital status: Legally Separated    Spouse name: Not on file   Number of children: 1   Years of education: Masters   Highest education level: Not on file  Occupational History   Occupation: retired    Fish farm manager: RETIRED  Tobacco Use   Smoking status: Former   Smokeless tobacco: Never  Substance and Sexual Activity   Alcohol use: No    Alcohol/week: 1.0 standard drink    Types: 1 Cans of beer per week    Comment: socially   Drug use: No   Sexual activity: Never  Other Topics Concern   Not on file  Social History Narrative   Patient lives in [facility] @ [facility]   Patient is right-handed.   Patient does not drink any caffeine.   Social Determinants of Health    Financial Resource Strain: Not on file  Food Insecurity: Not on file  Transportation Needs: Not on file  Physical Activity: Not on file  Stress: Not on file  Social Connections: Not on file    Allergies:  Allergies  Allergen Reactions   Aspirin Other (See Comments)    Dizziness, nausea and vomiting Balance issues   Lisinopril Other (See Comments)    Critical Cough   Lithium Other (See Comments)    Critical Per pt reaction unknown   Other Other (See Comments)   Oxycodone Nausea And Vomiting   Oxycodone Hcl Other (See Comments)   Oxycodone-Acetaminophen Nausea And Vomiting and Other (See Comments)    Also causes dizziness   Pork-Derived Products Other (See Comments)    Unspecified reaction    Metabolic Disorder Labs: Lab Results  Component Value Date   HGBA1C 5.9 (H) 06/04/2020   MPG 114.02 12/17/2019   MPG 120 (H) 12/20/2009   No results found for: PROLACTIN Lab Results  Component Value Date   CHOL 140 06/04/2020   TRIG 106 06/04/2020   HDL 35 (L) 06/04/2020   CHOLHDL 4.0 06/04/2020   VLDL 36 12/17/2019   LDLCALC 85 06/04/2020   LDLCALC 109 (H) 12/17/2019   Lab Results  Component Value Date   TSH 2.605 12/15/2019   TSH 2.850 10/16/2013    Therapeutic Level Labs: No results found for: LITHIUM No results found for: VALPROATE No components found for:  CBMZ  Current Medications: Current Outpatient Medications  Medication Sig Dispense Refill   amLODipine (NORVASC) 5 MG tablet Take 1 tablet (5 mg total) by mouth daily. 90 tablet 3   BELSOMRA 10 MG TABS TAKE 1 TABLET BY MOUTH AT BEDTIME 30 tablet 2   busPIRone (BUSPAR) 10 MG tablet TAKE 2 TABLETS BY MOUTH TWICE A DAY 120 tablet 3   candesartan (ATACAND) 32 MG tablet Take 32 mg by mouth daily.     clopidogrel (PLAVIX) 75 MG tablet Take 1 tablet (75 mg total) by mouth daily. 30 tablet 11   Diclofenac Sodium 3 % GEL as needed.     furosemide (LASIX) 40 MG tablet Take 1 tablet (40 mg total) by mouth 2 (two)  times daily. 180 tablet 3   hydrALAZINE (APRESOLINE) 50 MG tablet Take 1 tablet (50 mg total) by mouth 2 (two) times daily. 270 tablet 3   meloxicam (MOBIC) 15 MG tablet Take 15 mg by mouth daily.     metoprolol succinate (TOPROL-XL) 25 MG 24 hr tablet Take 25 mg by mouth daily.      ondansetron (ZOFRAN) 4 MG tablet Take 1 tablet (4 mg total) by mouth every 8 (eight) hours as needed for nausea. 8 tablet 5   PARoxetine (PAXIL-CR) 37.5 MG 24 hr tablet Take 1 tablet (37.5 mg total) by mouth at bedtime. 30 tablet 3   primidone (  MYSOLINE) 250 MG tablet TAKE 1 TABLET BY MOUTH TWICE A DAY 60 tablet 12   QUEtiapine (SEROQUEL) 25 MG tablet TAKE 3 TABLETS BY MOUTH AT BEDTIME 90 tablet 3   rosuvastatin (CRESTOR) 10 MG tablet Take 1 tablet (10 mg total) by mouth daily. 90 tablet 3   sildenafil (VIAGRA) 50 MG tablet 100 mg as needed.     spironolactone (ALDACTONE) 25 MG tablet Take 25 mg by mouth daily.     traZODone (DESYREL) 150 MG tablet Take 1 tablet (150 mg total) by mouth at bedtime as needed. for sleep 30 tablet 3   valACYclovir (VALTREX) 1000 MG tablet Take 1,000 mg by mouth as needed (herpes).     Vitamin D, Ergocalciferol, (DRISDOL) 1.25 MG (50000 UNIT) CAPS capsule Take by mouth once a week.     No current facility-administered medications for this visit.     Musculoskeletal: Strength & Muscle Tone: N/A virtual visit Gait & Station: N/A virtual visit Patient leans: N/A  Psychiatric Specialty Exam: Review of Systems  Psychiatric/Behavioral:  Negative for hallucinations, self-injury, sleep disturbance and suicidal ideas. The patient is not nervous/anxious and is not hyperactive.   All other systems reviewed and are negative.  There were no vitals taken for this visit.There is no height or weight on file to calculate BMI.  General Appearance: N/A  Eye Contact: N/A  Speech: Clear and coherent  Volume: Normal  Mood: Euthymic  Affect: N/A  Thought Process: Goal directed  Orientation:   Full (Time, Place, and Person)  Thought Content: Logical  Suicidal Thoughts: No  Homicidal Thoughts: No  Memory: Good  Judgement: Good  Insight: Good  Psychomotor Activity: N/A  Concentration: Good  Recall: Good  Fund of Knowledge: Good  Language: Good  Akathisia: N/A   Handed: Right  AIMS (if indicated): Not done  Assets:  Communication Skills Desire for Improvement  ADL's:  Intact  Cognition: WNL  Sleep:  Good   Screenings: AIMS    Flowsheet Row Clinical Support from 09/24/2020 in [facility] Admission (Discharged) from OP Visit from 02/02/2019 in [place] Total Score 1 12      AUDIT    Flowsheet Row Admission (Discharged) from OP Visit from 02/02/2019 in [place]  Alcohol Use Disorder Identification Test Final Score (AUDIT) 0      GAD-7    Flowsheet Row Video Visit from 12/29/2020 in [place] from 09/24/2020 in [facility]  Total GAD-7 Score 1 [place] Office Visit from 01/09/2020 in [facility] Office Visit from 03/25/2019 in [facility] Office Visit from 09/12/2018 in [facility] Office Visit from 03/12/2018 in [facility] Office Visit from 09/26/2017 in [facility]  Total Score (max 30 points ) '27 24 24 28 28      '$ PHQ2-9    Flowsheet Row Video Visit from 12/29/2020 in [place] from 09/24/2020 in [facility]  PHQ-2 Total Score 0 0  PHQ-9 Total Score 2 4      Flowsheet Row ED from 03/24/2020 in [place] [facility] Admission (Discharged) from OP Visit from 02/02/2019 in [place] No Risk Error: Question 1 not populated         Assessment and Plan: [name] is a  71 year old male presenting to [facility] for a follow up psychiatric evaluation. He has a psychiatric history of major depressive disorder. His symptoms are managed with Buspar 10 mg twice daily, Paxil 37.5 mg at bedtime, Seroquel 25 mg 3 tablets at bedtime, Trazodone 150 mg at bedtime, and Belsomra 10 mg at bedtime.  Patient reports medication compliance and denies adverse reactions.  He denies the need for dose adjustment today.  Collaboration of Care: Collaboration of Care: Medication Management AEB medications E scribed to patient's preferred pharmacy  1. Anxiety  - busPIRone (BUSPAR) 10 MG tablet; TAKE 2 TABLETS BY MOUTH TWICE A DAY  Dispense: 120 tablet; Refill: 2  2. Recurrent major depressive disorder, in full remission ([place])  - PARoxetine (PAXIL-CR) 37.5 MG 24 hr tablet; Take 1 tablet (37.5 mg total) by mouth at bedtime.  Dispense: 30 tablet; Refill: 2 - Suvorexant (BELSOMRA) 10 MG TABS; Take 1 tablet by mouth at bedtime.  Dispense: 30 tablet; Refill: 2 - QUEtiapine (SEROQUEL) 25 MG tablet; TAKE 3 TABLETS BY MOUTH AT BEDTIME  Dispense: 90 tablet; Refill: 2 - traZODone (DESYREL) 150 MG tablet; Take 1 tablet (150 mg total) by mouth at bedtime as needed. for sleep  Dispense: 30 tablet; Refill: 2   Return to care in 3 months   Patient/Guardian was advised Release of Information must be obtained prior to any record release in order to collaborate their care with an outside provider. Patient/Guardian was advised if they have not already done so to contact the registration department to sign all necessary forms in order for [place] to release information regarding their care.   Consent: Patient/Guardian gives verbal consent for treatment and assignment of benefits for services provided during this visit. Patient/Guardian expressed understanding and agreed to proceed.    Franne Grip, NP 03/29/2021, 3:23 PM

## 2021-03-31 DIAGNOSIS — R2681 Unsteadiness on feet: Secondary | ICD-10-CM | POA: Diagnosis not present

## 2021-03-31 DIAGNOSIS — M25561 Pain in right knee: Secondary | ICD-10-CM | POA: Diagnosis not present

## 2021-03-31 DIAGNOSIS — M6281 Muscle weakness (generalized): Secondary | ICD-10-CM | POA: Diagnosis not present

## 2021-04-06 DIAGNOSIS — M6281 Muscle weakness (generalized): Secondary | ICD-10-CM | POA: Diagnosis not present

## 2021-04-06 DIAGNOSIS — M25561 Pain in right knee: Secondary | ICD-10-CM | POA: Diagnosis not present

## 2021-04-06 DIAGNOSIS — R2681 Unsteadiness on feet: Secondary | ICD-10-CM | POA: Diagnosis not present

## 2021-04-06 NOTE — Progress Notes (Signed)
?Cardiology Office Note:   ? ?Date:  04/09/2021  ? ?ID:  [name], DOB 1950-10-29, MRN [id number] ? ?PCP:  Kristie Cowman, MD  ?[facility] Cardiologist:  Werner Lean, MD  ?[facility] Electrophysiologist:  None  ? ?CC: Pre-op Eval ?R knee Surgery [place] ? ?History of Present Illness:   ? ?[name] is a 71 y.o. male with a hx of HTN, HLD, Stroke NOS, MCD NOS, established in 2021.  ?In 2022 Started on spironolactone & lasix 40 mg PO; then started on amlodipine and hydralazine with [facility].  Had Ibuprofen use leading to AKI. ? ?Patient notes that he is doing better.   ?Girlfriend fractured to bones in her face and had a concussion. ?There are no interval hospital/ED visit. ?He is still taking as little ibuprofen as possible for her R knee pain; he cannot play as much gold as he wants and is left active  ? ?No chest pain or pressure .  No SOB/DOE and no PND/Orthopnea.  Notes slight weight gain.  No palpitations or syncope. ? ?Notes that he has worsening darken rash over his left leg.  Notes that his swelling has improved bust still persists  Is less sexually active because of suboptimal response to diuretic.  Does not bilateral foot pain.   ? ? ?Past Medical History:  ?Diagnosis Date  ? Depression   ? Frequent urination   ? GERD (gastroesophageal reflux disease)   ? Herpes   ? Hyperlipidemia   ? Hypertension   ? Memory loss   ? Obesity   ? Stroke [facility])   ? Urgency of urination   ? ? ?Past Surgical History:  ?Procedure Laterality Date  ? CHOLECYSTECTOMY N/A 12/17/2019  ? Procedure: LAPAROSCOPIC CHOLECYSTECTOMY WITH ICG INJECTION;  Surgeon: Rolm Bookbinder, MD;  Location: [place];  Service: General;  Laterality: N/A;  ? disckec    ? SPINAL FUSION    ? VASECTOMY    ? ? ?Current Medications: ?Current Meds  ?Medication Sig  ? amLODipine (NORVASC) 5 MG tablet Take 1 tablet (5 mg total) by mouth daily.  ? busPIRone (BUSPAR) 10 MG tablet TAKE 2 TABLETS BY MOUTH TWICE A DAY  ? candesartan  (ATACAND) 32 MG tablet Take 32 mg by mouth daily.  ? clopidogrel (PLAVIX) 75 MG tablet Take 1 tablet (75 mg total) by mouth daily.  ? Diclofenac Sodium 3 % GEL as needed.  ? furosemide (LASIX) 40 MG tablet Take 1 tablet (40 mg total) by mouth 2 (two) times daily.  ? hydrALAZINE (APRESOLINE) 50 MG tablet Take 1 tablet (50 mg total) by mouth 2 (two) times daily.  ? meloxicam (MOBIC) 15 MG tablet Take 15 mg by mouth daily.  ? metoprolol succinate (TOPROL-XL) 25 MG 24 hr tablet Take 25 mg by mouth daily.   ? PARoxetine (PAXIL-CR) 37.5 MG 24 hr tablet Take 1 tablet (37.5 mg total) by mouth at bedtime.  ? primidone (MYSOLINE) 250 MG tablet TAKE 1 TABLET BY MOUTH TWICE A DAY  ? QUEtiapine (SEROQUEL) 25 MG tablet TAKE 3 TABLETS BY MOUTH AT BEDTIME  ? sildenafil (VIAGRA) 50 MG tablet 100 mg as needed.  ? spironolactone (ALDACTONE) 25 MG tablet Take 25 mg by mouth daily.  ? Suvorexant (BELSOMRA) 10 MG TABS Take 1 tablet by mouth at bedtime.  ? traZODone (DESYREL) 150 MG tablet Take 1 tablet (150 mg total) by mouth at bedtime as needed. for sleep  ? valACYclovir (VALTREX) 1000 MG tablet Take 1,000 mg  by mouth as needed (herpes).  ? Vitamin D, Ergocalciferol, (DRISDOL) 1.25 MG (50000 UNIT) CAPS capsule Take by mouth once a week.  ? [DISCONTINUED] ondansetron (ZOFRAN) 4 MG tablet Take 1 tablet (4 mg total) by mouth every 8 (eight) hours as needed for nausea.  ?  ? ?Allergies:   Aspirin, Lisinopril, Lithium, Other, Oxycodone, Oxycodone hcl, Oxycodone-acetaminophen, and Pork-derived products  ? ?Social History  ? ?Socioeconomic History  ? Marital status: Legally Separated  ?  Spouse name: Not on file  ? Number of children: 1  ? Years of education: Masters  ? Highest education level: Not on file  ?Occupational History  ? Occupation: retired  ?  Employer: RETIRED  ?Tobacco Use  ? Smoking status: Former  ? Smokeless tobacco: Never  ?Substance and Sexual Activity  ? Alcohol use: No  ?  Alcohol/week: 1.0 standard drink  ?  Types: 1  Cans of beer per week  ?  Comment: socially  ? Drug use: No  ? Sexual activity: Never  ?Other Topics Concern  ? Not on file  ?Social History Narrative  ? Patient lives in [place] [facility] @ [place]  ? Patient is right-handed.  ? Patient does not drink any caffeine.  ? ?Social Determinants of Health  ? ?Financial Resource Strain: Not on file  ?Food Insecurity: Not on file  ?Transportation Needs: Not on file  ?Physical Activity: Not on file  ?Stress: Not on file  ?Social Connections: Not on file  ?  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 ? ?Family History: ?The patient's family history includes Cancer in his maternal aunt and maternal aunt; Cancer - Ovarian in his maternal grandmother; Diabetes Mellitus I in his paternal grandmother; Hypertension in his brother, father, mother, paternal grandfather, and sister. ?History of coronary artery disease notable for no members. ?History of heart failure notable for no members. ?No history of cardiomyopathies including hypertrophic cardiomyopathy, left ventricular non-compaction, or arrhythmogenic right ventricular cardiomyopathy. ?History of arrhythmia notable for no members. ?Denies family history of sudden cardiac death including drowning, car accidents, or unexplained deaths in the family. ?No history of bicuspid aortic valve or aortic aneurysm or dissection. ? ?ROS:   ?Please see the history of present illness.    ? All other systems reviewed and are negative. ? ?EKGs/Labs/Other Studies Reviewed:   ? ?The following studies were reviewed today: ? ?EKG:   ?04/09/21 Sinus bradycardia rate 58 RBBB ?01/24/08 SR 82 WNL ?12/16/19:  SR rate 65, iRBBB and LAFB ? ?Transthoracic Echocardiogram: ?Date: 01/02/20 ?Results: Severe LA dilation; IVC WNL E/E' 20 RVSP 22 ?1. Left ventricular ejection fraction, by estimation, is 60 to 65%. The  ?left ventricle has normal function. The left ventricle has no regional  ?wall  motion abnormalities. There is mild concentric left ventricular  ?hypertrophy. Left ventricular diastolic  ?parameters are consistent with Grade II diastolic dysfunction  ?(pseudonormalization). Elevated left atrial pressure.  ? 2. Right ventricular systolic function is normal. The right ventricular  ?size is mildly enlarged. There is normal pulmonary artery systolic  ?pressure.  ? 3. Left atrial size was severely dilated.  ? 4. Right atrial size was moderately dilated.  ? 5. The mitral valve is normal in structure. No evidence of mitral valve  ?regurgitation. No evidence of mitral stenosis.  ? 6. The aortic valve is normal in structure. Aortic valve regurgitation is  ?not visualized. Mild aortic valve sclerosis is present, with no evidence  ?of  aortic valve stenosis.  ? 7. The inferior vena cava is normal in size with greater than 50%  ?respiratory variability, suggesting right atrial pressure of 3 mmHg. ? ?Recent Labs: ?07/01/2020: Magnesium 1.8 ?07/17/2020: BUN 9; Creatinine, Ser 1.16; NT-Pro BNP 95; Potassium 4.1; Sodium 141  ?Recent Lipid Panel ?   ?Component Value Date/Time  ? CHOL 140 06/04/2020 1320  ? TRIG 106 06/04/2020 1320  ? HDL 35 (L) 06/04/2020 1320  ? CHOLHDL 4.0 06/04/2020 1320  ? CHOLHDL 5.7 12/17/2019 0241  ? VLDL 36 12/17/2019 0241  ? [place] 85 06/04/2020 1320  ? ? ?Physical Exam:   ? ?VS:  BP 118/70   Pulse (!) 58   Ht '5\' 7"'$  (1.702 m)   Wt 200 lb 12.8 oz (91.1 kg)   SpO2 96%   BMI 31.45 kg/m?    ? ?Wt Readings from Last 3 Encounters:  ?04/09/21 200 lb 12.8 oz (91.1 kg)  ?12/17/20 205 lb (93 kg)  ?10/02/20 203 lb 6.4 oz (92.3 kg)  ?  ?Gen: No distress  ?Neck: No JVD ?Cardiac: No Rubs or Gallops, no Murmur, regular bradycardia, +2 radial pulses ?Respiratory: Clear to auscultation bilaterally, normal effort, normal  respiratory rate ?GI: Soft, nontender, non-distended  ?MS: Non putting edema;  moves all extremities ?Integument: Skin There is darkening skins in the neck consistent with acanthosis  nigricans (A1c 5.9); there is darkening over the left ankle that is dark gray and raised ?Neuro:  At time of evaluation, alert and oriented to person/place/time/situation  ?Psych: Normal affect, patient feels  ? ? ?

## 2021-04-09 ENCOUNTER — Encounter: Payer: Self-pay | Admitting: Internal Medicine

## 2021-04-09 ENCOUNTER — Other Ambulatory Visit: Payer: Self-pay

## 2021-04-09 ENCOUNTER — Ambulatory Visit (INDEPENDENT_AMBULATORY_CARE_PROVIDER_SITE_OTHER): Payer: Medicare Other | Admitting: Internal Medicine

## 2021-04-09 VITALS — BP 118/70 | HR 58 | Ht 67.0 in | Wt 200.8 lb

## 2021-04-09 DIAGNOSIS — N179 Acute kidney failure, unspecified: Secondary | ICD-10-CM | POA: Diagnosis not present

## 2021-04-09 DIAGNOSIS — I10 Essential (primary) hypertension: Secondary | ICD-10-CM

## 2021-04-09 DIAGNOSIS — Z8673 Personal history of transient ischemic attack (TIA), and cerebral infarction without residual deficits: Secondary | ICD-10-CM

## 2021-04-09 DIAGNOSIS — I5032 Chronic diastolic (congestive) heart failure: Secondary | ICD-10-CM

## 2021-04-09 NOTE — Patient Instructions (Signed)
Medication Instructions:  ?Your physician recommends that you continue on your current medications as directed. Please refer to the Current Medication list given to you today. ? ?*If you need a refill on your cardiac medications before your next appointment, please call your pharmacy* ? ? ?Lab Work: ?TODAY: BMP ?If you have labs (blood work) drawn today and your tests are completely normal, you will receive your results only by: ?MyChart Message (if you have MyChart) OR ?A paper copy in the mail ?If you have any lab test that is abnormal or we need to change your treatment, we will call you to review the results. ? ? ?Testing/Procedures: ?NONE ? ? ?Follow-Up: ?At [facility], you and your health needs are our priority.  As part of our continuing mission to provide you with exceptional heart care, we have created designated Provider Care Teams.  These Care Teams include your primary Cardiologist (physician) and Advanced Practice Providers (APPs -  Physician Assistants and Nurse Practitioners) who all work together to provide you with the care you need, when you need it. ? ?We recommend signing up for the patient portal called "MyChart".  Sign up information is provided on this After Visit Summary.  MyChart is used to connect with patients for Virtual Visits (Telemedicine).  Patients are able to view lab/test results, encounter notes, upcoming appointments, etc.  Non-urgent messages can be sent to your provider as well.   ?To learn more about what you can do with MyChart, go to [email or web address].   ? ?Your next appointment:   ?6 month(s) ? ?The format for your next appointment:   ?In Person ? ?Provider:   ?Melina Copa, PA-C, Ermalinda Barrios, PA-C, or Christen Bame, NP     Then, Werner Lean, MD will plan to see you again in 12 month(s).{ ? ?

## 2021-04-10 LAB — BASIC METABOLIC PANEL
BUN/Creatinine Ratio: 8 — ABNORMAL LOW (ref 10–24)
BUN: 9 mg/dL (ref 8–27)
CO2: 22 mmol/L (ref 20–29)
Calcium: 8.7 mg/dL (ref 8.6–10.2)
Chloride: 104 mmol/L (ref 96–106)
Creatinine, Ser: 1.19 mg/dL (ref 0.76–1.27)
Glucose: 88 mg/dL (ref 70–99)
Potassium: 4.1 mmol/L (ref 3.5–5.2)
Sodium: 140 mmol/L (ref 134–144)
eGFR: 66 mL/min/{1.73_m2} (ref >59)

## 2021-04-14 DIAGNOSIS — M6281 Muscle weakness (generalized): Secondary | ICD-10-CM | POA: Diagnosis not present

## 2021-04-14 DIAGNOSIS — M25561 Pain in right knee: Secondary | ICD-10-CM | POA: Diagnosis not present

## 2021-04-14 DIAGNOSIS — R2681 Unsteadiness on feet: Secondary | ICD-10-CM | POA: Diagnosis not present

## 2021-04-15 DIAGNOSIS — F332 Major depressive disorder, recurrent severe without psychotic features: Secondary | ICD-10-CM | POA: Diagnosis not present

## 2021-04-19 ENCOUNTER — Ambulatory Visit: Payer: Medicare Other | Admitting: Neurology

## 2021-04-22 NOTE — H&P (Signed)
TOTAL KNEE ADMISSION H&P ? ?Patient is being admitted for right total knee arthroplasty. ? ?Subjective: ? ?Chief Complaint: Right knee pain. ? ?HPI: [name], 71 y.o. male has a history of pain and functional disability in the right knee due to arthritis and has failed non-surgical conservative treatments for greater than 12 weeks to include NSAID's and/or analgesics, corticosteriod injections, and activity modification. Onset of symptoms was gradual, starting several years ago with gradually worsening course since that time. The patient noted no past surgery on the right knee.  Patient currently rates pain in the right knee at 8 out of 10 with activity. Patient has night pain, worsening of pain with activity and weight bearing, and pain that interferes with activities of daily living. Patient has evidence of   valgus deformity with bone-on-bone arthritis in the patellofemoral and lateral compartments  by imaging studies. There is no active infection. ? ?Patient Active Problem List  ? Diagnosis Date Noted  ? AKI (acute kidney injury) ([place]) 04/09/2021  ? Recurrent major depressive disorder, in full remission ([place]) 01/07/2020  ? Chronic heart failure with preserved ejection fraction ([place]) 01/02/2020  ? Hypertension 01/02/2020  ? Right upper quadrant abdominal pain 12/16/2019  ? Elevated transaminase level 12/15/2019  ? MDD (major depressive disorder), recurrent, in partial remission ([place]) 02/07/2019  ? Severe recurrent major depression without psychotic features ([place]) 02/02/2019  ? Tardive dyskinesia 12/03/2018  ? Hyperlipidemia 06/14/2017  ? History of stroke 11/11/2015  ? Memory loss 10/16/2013  ? Tremor 04/04/2013  ? ? ?Past Medical History:  ?Diagnosis Date  ? Depression   ? Frequent urination   ? GERD (gastroesophageal reflux disease)   ? Herpes   ? Hyperlipidemia   ? Hypertension   ? Memory loss   ? Obesity   ? Stroke [facility])   ? Urgency of urination   ? ? ?Past Surgical History:  ?Procedure Laterality Date   ? CHOLECYSTECTOMY N/A 12/17/2019  ? Procedure: LAPAROSCOPIC CHOLECYSTECTOMY WITH ICG INJECTION;  Surgeon: Rolm Bookbinder, MD;  Location: [facility];  Service: General;  Laterality: N/A;  ? disckec    ? SPINAL FUSION    ? VASECTOMY    ? ? ?Prior to Admission medications   ?Medication Sig Start Date End Date Taking? Authorizing Provider  ?amLODipine (NORVASC) 5 MG tablet Take 1 tablet (5 mg total) by mouth daily. 03/12/20   Werner Lean, MD  ?busPIRone (BUSPAR) 10 MG tablet TAKE 2 TABLETS BY MOUTH TWICE A DAY 03/29/21   Penn, Lunette Stands, NP  ?candesartan (ATACAND) 32 MG tablet Take 32 mg by mouth daily. 11/22/19   [provider]  ?clopidogrel (PLAVIX) 75 MG tablet Take 1 tablet (75 mg total) by mouth daily. 12/18/19   Shawna Clamp, MD  ?Diclofenac Sodium 3 % GEL as needed. 07/06/17   [provider]  ?furosemide (LASIX) 40 MG tablet Take 1 tablet (40 mg total) by mouth 2 (two) times daily. 07/01/20   Werner Lean, MD  ?hydrALAZINE (APRESOLINE) 50 MG tablet Take 1 tablet (50 mg total) by mouth 2 (two) times daily. 03/16/20   Werner Lean, MD  ?meloxicam (MOBIC) 15 MG tablet Take 15 mg by mouth daily. 03/11/20   [provider]  ?metoprolol succinate (TOPROL-XL) 25 MG 24 hr tablet Take 25 mg by mouth daily.  10/04/18   [provider]  ?PARoxetine (PAXIL-CR) 37.5 MG 24 hr tablet Take 1 tablet (37.5 mg total) by mouth at bedtime. 03/29/21   Franne Grip, NP  ?  primidone (MYSOLINE) 250 MG tablet TAKE 1 TABLET BY MOUTH TWICE A DAY 11/09/20   Frann Rider, NP  ?QUEtiapine (SEROQUEL) 25 MG tablet TAKE 3 TABLETS BY MOUTH AT BEDTIME 03/29/21   Penn, Lunette Stands, NP  ?sildenafil (VIAGRA) 50 MG tablet 100 mg as needed.    [provider]  ?spironolactone (ALDACTONE) 25 MG tablet Take 25 mg by mouth daily. 09/14/20   [provider]  ?Suvorexant (BELSOMRA) 10 MG TABS Take 1 tablet by mouth at bedtime. 03/29/21   Penn, Lunette Stands, NP  ?traZODone (DESYREL) 150 MG  tablet Take 1 tablet (150 mg total) by mouth at bedtime as needed. for sleep 03/29/21   Franne Grip, NP  ?valACYclovir (VALTREX) 1000 MG tablet Take 1,000 mg by mouth as needed (herpes).    [provider]  ?Vitamin D, Ergocalciferol, (DRISDOL) 1.25 MG (50000 UNIT) CAPS capsule Take by mouth once a week. 09/17/20   [provider]  ? ? ?Allergies  ?Allergen Reactions  ? Aspirin Other (See Comments)  ?  Dizziness, nausea and vomiting ?Balance issues  ? Lisinopril Other (See Comments)  ?  Critical ?Cough  ? Lithium Other (See Comments)  ?  Critical ?Per pt reaction unknown  ? Other Other (See Comments)  ? Oxycodone Nausea And Vomiting  ? Oxycodone Hcl Other (See Comments)  ? Oxycodone-Acetaminophen Nausea And Vomiting and Other (See Comments)  ?  Also causes dizziness  ? Pork-Derived Products Other (See Comments)  ?  Unspecified reaction  ? ? ?Social History  ? ?Socioeconomic History  ? Marital status: Legally Separated  ?  Spouse name: Not on file  ? Number of children: 1  ? Years of education: Masters  ? Highest education level: Not on file  ?Occupational History  ? Occupation: retired  ?  Employer: RETIRED  ?Tobacco Use  ? Smoking status: Former  ? Smokeless tobacco: Never  ?Substance and Sexual Activity  ? Alcohol use: No  ?  Alcohol/week: 1.0 standard drink  ?  Types: 1 Cans of beer per week  ?  Comment: socially  ? Drug use: No  ? Sexual activity: Never  ?Other Topics Concern  ? Not on file  ?Social History Narrative  ? Patient lives in [place] living @ [facility]  ? Patient is right-handed.  ? Patient does not drink any caffeine.  ? ?Social Determinants of Health  ? ?Financial Resource Strain: Not on file  ?Food Insecurity: Not on file  ?Transportation Needs: Not on file  ?Physical Activity: Not on file  ?Stress: Not on file  ?Social Connections: Not on file  ?Intimate Partner Violence: Not on file  ? ? ?Tobacco Use: Medium Risk  ? Smoking Tobacco Use: Former  ? Smokeless Tobacco Use:  Never  ? Passive Exposure: Not on file  ? ?Social History  ? ?Substance and Sexual Activity  ?Alcohol Use No  ? Alcohol/week: 1.0 standard drink  ? Types: 1 Cans of beer per week  ? Comment: socially  ? ? ?Family History  ?Problem Relation Age of Onset  ? Hypertension Mother   ? Hypertension Father   ? Hypertension Sister   ? Hypertension Brother   ? Cancer Maternal Aunt   ? Cancer - Ovarian Maternal Grandmother   ? Diabetes Mellitus I Paternal Grandmother   ? Hypertension Paternal Grandfather   ? Cancer Maternal Aunt   ? ? ?Review of Systems  ?Constitutional:  Negative for chills, fever and malaise/fatigue.  ?HENT: Negative.    ?Eyes: Negative.   ?  Respiratory:  Negative for cough and shortness of breath.   ?Cardiovascular:  Negative for chest pain, palpitations and leg swelling.  ?Gastrointestinal:  Negative for abdominal pain, diarrhea, nausea and vomiting.  ?Genitourinary: Negative.   ?Musculoskeletal:  Positive for joint pain.  ? ?Objective: ? ?Physical Exam: ?Well nourished and well developed. ?General: Alert and oriented x3, cooperative and pleasant, no acute distress. ?Head: normocephalic, atraumatic, neck supple. ?Eyes: EOMI. ?Abdomen: non-tender to palpation and soft, normoactive bowel sounds. ?Musculoskeletal: ?The patient has a significantly antalgic gait pattern favoring the right side without the use of assistive devices. ? ?Right Knee Exam: ?Valgus deformity. ?No effusion present. No swelling present. ?Range of motion: A couple of degrees of hyperextension to 120 degrees of flexion. ?No crepitus on range of motion of the knee. ?No medial joint line tenderness. ?No lateral joint line tenderness. ?He has pseudolaxity that he corrects to neutral from the valgus. ?He has slight AP laxity. ? ?Left Knee Exam: ?Trace effusion present. No swelling present. ?Range of motion: 0 to 125 degrees. ?No crepitus on range of motion of the knee ?No medial joint line tenderness ?No lateral joint line tenderness. ?The  knee is stable. ? ?Calves soft and nontender. Motor function intact in LE. Strength 5/5 LE bilaterally. ?Neuro: Distal pulses 2+. Sensation to light touch intact in LE. ? ?Vital signs in last 24 hours: ?BP:

## 2021-04-23 DIAGNOSIS — M6281 Muscle weakness (generalized): Secondary | ICD-10-CM | POA: Diagnosis not present

## 2021-04-23 DIAGNOSIS — R2681 Unsteadiness on feet: Secondary | ICD-10-CM | POA: Diagnosis not present

## 2021-04-23 DIAGNOSIS — M25561 Pain in right knee: Secondary | ICD-10-CM | POA: Diagnosis not present

## 2021-04-29 DIAGNOSIS — M6281 Muscle weakness (generalized): Secondary | ICD-10-CM | POA: Diagnosis not present

## 2021-04-29 DIAGNOSIS — F332 Major depressive disorder, recurrent severe without psychotic features: Secondary | ICD-10-CM | POA: Diagnosis not present

## 2021-04-29 DIAGNOSIS — R2681 Unsteadiness on feet: Secondary | ICD-10-CM | POA: Diagnosis not present

## 2021-04-29 DIAGNOSIS — M25561 Pain in right knee: Secondary | ICD-10-CM | POA: Diagnosis not present

## 2021-05-04 ENCOUNTER — Encounter: Payer: Self-pay | Admitting: Neurology

## 2021-05-04 ENCOUNTER — Ambulatory Visit: Payer: Medicare Other | Admitting: Neurology

## 2021-05-05 DIAGNOSIS — F332 Major depressive disorder, recurrent severe without psychotic features: Secondary | ICD-10-CM | POA: Diagnosis not present

## 2021-05-06 DIAGNOSIS — M25561 Pain in right knee: Secondary | ICD-10-CM | POA: Diagnosis not present

## 2021-05-06 DIAGNOSIS — R2681 Unsteadiness on feet: Secondary | ICD-10-CM | POA: Diagnosis not present

## 2021-05-06 DIAGNOSIS — M6281 Muscle weakness (generalized): Secondary | ICD-10-CM | POA: Diagnosis not present

## 2021-05-06 NOTE — Progress Notes (Signed)
Sent message, via epic in basket, requesting orders in epic from surgeon.  

## 2021-05-07 ENCOUNTER — Telehealth: Payer: Self-pay | Admitting: Internal Medicine

## 2021-05-07 NOTE — Telephone Encounter (Signed)
Patient's daughter is following up requesting an update on 05/24/21 clearance (see 01/22/21 phone encounter). ?

## 2021-05-07 NOTE — Telephone Encounter (Signed)
Pt's daughter called into our HIM dept. Larkin Ina in HIM Dept stated  the daughter was asking if the pt has been cleared. I answered yes and that the notes had been faxed to the requesting office after the appt with the MD.  ?

## 2021-05-12 DIAGNOSIS — Z20822 Contact with and (suspected) exposure to covid-19: Secondary | ICD-10-CM | POA: Diagnosis not present

## 2021-05-12 NOTE — Progress Notes (Signed)
Please place orders for PAT appointment scheduled 05/13/21. ?

## 2021-05-12 NOTE — Progress Notes (Addendum)
COVID Vaccine Completed: yes x3 ?Date COVID Vaccine completed: ?Has received booster: ?COVID vaccine manufacturer: [place]  ? ?Date of COVID positive in last 90 days: no ? ?PCP - Kristie Cowman, MD ?Cardiologist - Terrilyn Saver, MD ? ?Cardiac clearance 04/09/21 Rudean Haskell in Epic/chart ? ?Chest x-ray - n/a ?EKG - 04/09/21 Epic ?Stress Test - many years ago per pt ?ECHO - 12/15/19 Epic ?Cardiac Cath - n/a ?Pacemaker/ICD device last checked: n/a ?Spinal Cord Stimulator: n/a ? ?Bowel Prep - no ? ?Sleep Study - no ?CPAP -  ? ?Fasting Blood Sugar - n/a ?Checks Blood Sugar _____ times a day ? ?Blood Thinner Instructions: Plavix prescribed by neurology, patients daughter will contact office ?Aspirin Instructions: ?Last Dose: ? ?Activity level: Can go up a flight of stairs and perform activities of daily living without stopping and without symptoms of chest pain or shortness of breath. ?   ?Anesthesia review: CHF, HTN, stroke, tremor  ? ?Patient denies shortness of breath, fever, cough and chest pain at PAT appointment ? ? ?Patient verbalized understanding of instructions that were given to them at the PAT appointment. Patient was also instructed that they will need to review over the PAT instructions again at home before surgery.  ?

## 2021-05-12 NOTE — Patient Instructions (Addendum)
DUE TO COVID-19 ONLY TWO VISITORS  (aged 69 and older)  ARE ALLOWED TO COME WITH YOU AND STAY IN THE WAITING ROOM ONLY DURING PRE OP AND PROCEDURE.   ?**NO VISITORS ARE ALLOWED IN THE SHORT STAY AREA OR RECOVERY ROOM!!** ? ?IF YOU WILL BE ADMITTED INTO THE HOSPITAL YOU ARE ALLOWED ONLY FOUR SUPPORT PEOPLE DURING VISITATION HOURS ONLY (7 AM -8PM)   ?The support person(s) must pass our screening, gel in and out, and wear a mask at all times, including in the patient?s room. ?Patients must also wear a mask when staff or their support person are in the room. ?Visitors GUEST BADGE MUST BE WORN VISIBLY  ?One adult visitor may remain with you overnight and MUST be in the room by 8 P.M. ?  ? ? Your procedure is scheduled on: 05/24/21 ? ? Report to [facility] Main Entrance ? ?  Report to admitting at 8:50 AM ? ? Call this number if you have problems the morning of surgery [phone] ? ? Do not eat food :After Midnight. ? ? After Midnight you may have the following liquids until 8:35 AM DAY OF SURGERY ? ?Water ?Black Coffee (sugar ok, NO MILK/CREAM OR CREAMERS)  ?Tea (sugar ok, NO MILK/CREAM OR CREAMERS) regular and decaf                             ?Plain Jell-O (NO RED)                                           ?Fruit ices (not with fruit pulp, NO RED)                                     ?Popsicles (NO RED)                                                                  ?Juice: apple, WHITE grape, WHITE cranberry ?Sports drinks like Gatorade (NO RED) ?Clear broth(vegetable,chicken,beef) ? ?FOLLOW BOWEL PREP AND ANY ADDITIONAL PRE OP INSTRUCTIONS YOU RECEIVED FROM YOUR SURGEON'S OFFICE!!! ?  ?  ?Oral Hygiene is also important to reduce your risk of infection.                                    ?Remember - BRUSH YOUR TEETH THE MORNING OF SURGERY WITH YOUR REGULAR TOOTHPASTE ? ? Take these medicines the morning of surgery with A SIP OF WATER: Amlodipine, Buspirone, Gabapentin, Hydralazine, Metoprolol, Primidone,  Rosuvastatin.  ? ?These are anesthesia recommendations for holding your anticoagulants.  Please contact your prescribing physician to confirm IF it is safe to hold your anticoagulants for this length of time. ? ? ?Eliquis Apixaban   72 hours   ?Xarelto Rivaroxaban   72 hours  ?Plavix Clopidogrel   120 hours  ?Pletal Cilostazol   120 hours  ?                   ? ?  You may not have any metal on your body including jewelry, and body piercing ? ?           Do not wear lotions, powders, cologne, or deodorant ? ?            Men may shave face and neck. ? ? Do not bring valuables to the hospital. [facility] NOT ?            RESPONSIBLE   FOR VALUABLES. ? ? Bring small overnight bag day of surgery. ?  ?            Please read over the following fact sheets you were given: IF [place] [phone]- Apolonio Schneiders ? ?   [facility] - Preparing for Surgery ?Before surgery, you can play an important role.  Because skin is not sterile, your skin needs to be as free of germs as possible.  You can reduce the number of germs on your skin by washing with CHG (chlorahexidine gluconate) soap before surgery.  CHG is an antiseptic cleaner which kills germs and bonds with the skin to continue killing germs even after washing. ?Please DO NOT use if you have an allergy to CHG or antibacterial soaps.  If your skin becomes reddened/irritated stop using the CHG and inform your nurse when you arrive at Short Stay. ?Do not shave (including legs and underarms) for at least 48 hours prior to the first CHG shower.  You may shave your face/neck. ? ?Please follow these instructions carefully: ? 1.  Shower with CHG Soap the night before surgery and the  morning of surgery. ? 2.  If you choose to wash your hair, wash your hair first as usual with your normal  shampoo. ? 3.  After you shampoo, rinse your hair and body thoroughly to remove the shampoo.                            ? 4.  Use CHG as  you would any other liquid soap.  You can apply chg directly to the skin and wash.  Gently with a scrungie or clean washcloth. ? 5.  Apply the CHG Soap to your body ONLY FROM THE NECK DOWN.   Do   not use on face/ open      ?                     Wound or open sores. Avoid contact with eyes, ears mouth and   genitals (private parts).  ?                     Production manager,  Genitals (private parts) with your normal soap. ?            6.  Wash thoroughly, paying special attention to the area where your    surgery  will be performed. ? 7.  Thoroughly rinse your body with warm water from the neck down. ? 8.  DO NOT shower/wash with your normal soap after using and rinsing off the CHG Soap. ?               9.  Pat yourself dry with a clean towel. ?           10.  Wear clean pajamas. ?           11.  Place clean sheets  on your bed the night of your first shower and do not  sleep with pets. ?Day of Surgery : ?Do not apply any lotions/deodorants the morning of surgery.  Please wear clean clothes to the hospital/surgery center. ? ?FAILURE TO FOLLOW THESE INSTRUCTIONS MAY RESULT IN THE CANCELLATION OF YOUR SURGERY ? ?PATIENT SIGNATURE_________________________________ ? ?NURSE SIGNATURE__________________________________ ? ?________________________________________________________________________  ?

## 2021-05-13 ENCOUNTER — Encounter (HOSPITAL_COMMUNITY)
Admission: RE | Admit: 2021-05-13 | Discharge: 2021-05-13 | Disposition: A | Discharge status: Home / Self Care | Payer: Medicare Other | Source: Ambulatory Visit | Attending: Orthopedic Surgery | Admitting: Orthopedic Surgery

## 2021-05-13 ENCOUNTER — Telehealth: Payer: Self-pay | Admitting: Neurology

## 2021-05-13 ENCOUNTER — Encounter (HOSPITAL_COMMUNITY): Payer: Self-pay

## 2021-05-13 VITALS — BP 112/60 | HR 66 | Temp 98.5°F | Resp 16 | Ht 67.0 in

## 2021-05-13 DIAGNOSIS — M25561 Pain in right knee: Secondary | ICD-10-CM | POA: Diagnosis not present

## 2021-05-13 DIAGNOSIS — M6281 Muscle weakness (generalized): Secondary | ICD-10-CM | POA: Diagnosis not present

## 2021-05-13 DIAGNOSIS — Z01812 Encounter for preprocedural laboratory examination: Secondary | ICD-10-CM | POA: Diagnosis not present

## 2021-05-13 DIAGNOSIS — R2681 Unsteadiness on feet: Secondary | ICD-10-CM | POA: Diagnosis not present

## 2021-05-13 DIAGNOSIS — Z01818 Encounter for other preprocedural examination: Secondary | ICD-10-CM

## 2021-05-13 DIAGNOSIS — I10 Essential (primary) hypertension: Secondary | ICD-10-CM | POA: Insufficient documentation

## 2021-05-13 HISTORY — DX: Unspecified diastolic (congestive) heart failure: I50.30

## 2021-05-13 HISTORY — DX: Headache, unspecified: R51.9

## 2021-05-13 LAB — BASIC METABOLIC PANEL
Anion gap: 7 (ref 5–15)
BUN: 17 mg/dL (ref 8–23)
CO2: 27 mmol/L (ref 22–32)
Calcium: 8.9 mg/dL (ref 8.9–10.3)
Chloride: 103 mmol/L (ref 98–111)
Creatinine, Ser: 1.49 mg/dL — ABNORMAL HIGH (ref 0.61–1.24)
GFR, Estimated: 50 mL/min — ABNORMAL LOW (ref >60)
Glucose, Bld: 102 mg/dL — ABNORMAL HIGH (ref 70–99)
Potassium: 4.5 mmol/L (ref 3.5–5.1)
Sodium: 137 mmol/L (ref 135–145)

## 2021-05-13 LAB — CBC
HCT: 36.4 % — ABNORMAL LOW (ref 39.0–52.0)
Hemoglobin: 12.3 g/dL — ABNORMAL LOW (ref 13.0–17.0)
MCH: 33.8 pg (ref 26.0–34.0)
MCHC: 33.8 g/dL (ref 30.0–36.0)
MCV: 100 fL (ref 80.0–100.0)
Platelets: 309 10*3/uL (ref 150–400)
RBC: 3.64 MIL/uL — ABNORMAL LOW (ref 4.22–5.81)
RDW: 12.4 % (ref 11.5–15.5)
WBC: 4.3 10*3/uL (ref 4.0–10.5)
nRBC: 0 % (ref 0.0–0.2)

## 2021-05-13 LAB — SURGICAL PCR SCREEN
MRSA, PCR: NEGATIVE
Staphylococcus aureus: POSITIVE — AB

## 2021-05-13 NOTE — Telephone Encounter (Signed)
I spoke to the patient and his daughter. He is having right knee replacement on 05/24/21 (by Dr. Wynelle Link). Last seen by Dr. [name] 06/04/20, instructed to follow up in two months. He has a pending appt for surgical clearance on 05/17/21. [facility] will be faxing over the risk assessment form for completion.  ?

## 2021-05-13 NOTE — Progress Notes (Signed)
PCR [facility]+, results routed to Dr. Wynelle Link ?

## 2021-05-13 NOTE — Telephone Encounter (Signed)
Patient and daughter walked into lobby requesting a call back from nurse regarding upcoming knee surgery and how he should titrate the Plavix. Surgery is May 8. They state they were told 5 days prior but the hospital wanted to confirm with Dr. [name]. Best call back is [phone] and daughter [phone]. Both want calls ?

## 2021-05-14 ENCOUNTER — Encounter (HOSPITAL_COMMUNITY): Payer: Self-pay | Admitting: Physician Assistant

## 2021-05-17 ENCOUNTER — Ambulatory Visit (INDEPENDENT_AMBULATORY_CARE_PROVIDER_SITE_OTHER): Payer: Medicare Other | Admitting: Neurology

## 2021-05-17 ENCOUNTER — Telehealth: Payer: Self-pay | Admitting: Neurology

## 2021-05-17 ENCOUNTER — Ambulatory Visit: Payer: Medicare Other | Admitting: Neurology

## 2021-05-17 ENCOUNTER — Encounter: Payer: Self-pay | Admitting: Neurology

## 2021-05-17 VITALS — BP 124/81 | HR 69 | Ht 67.0 in | Wt 200.0 lb

## 2021-05-17 DIAGNOSIS — G250 Essential tremor: Secondary | ICD-10-CM | POA: Diagnosis not present

## 2021-05-17 DIAGNOSIS — Z8673 Personal history of transient ischemic attack (TIA), and cerebral infarction without residual deficits: Secondary | ICD-10-CM | POA: Diagnosis not present

## 2021-05-17 DIAGNOSIS — G3184 Mild cognitive impairment, so stated: Secondary | ICD-10-CM

## 2021-05-17 NOTE — Progress Notes (Signed)
Surgery clearance completed today during office visit  ?

## 2021-05-17 NOTE — Telephone Encounter (Signed)
Pt canceled 05/17/2021 10:30 appt due to not being able to leave work.  ?Pt rescheduled for same day 05/17/2021 at 3:00.  ?

## 2021-05-17 NOTE — Progress Notes (Signed)
?[facility] ?[address] ?[place]. [place] [address] ?[phone] ? ?     OFFICE CONSULT NOTE ? ?[name] ?Date of Birth:  Jan 25, 1950 ?Medical Record Number:  [id number]  ? ?Referring MD: [name] ? ?Reason for Referral: Headaches ? ?HPI: Initial visit 06/04/2020 [name] is a 71 year old African-American male seen today for consultation visit for headaches.  History is obtained from the patient and review of referral notes and I personally reviewed electronic medical records and pertinent imaging films in PACS.  He has past medical history of hypertension, hyperlipidemia, obesity, right brain subcortical infarct in 2011.  He states over 4 months ago he developed new onset of headaches which were mostly nocturnal.  He would wake up from sleep with a severe headache which he describes as bitemporal and spreading into the retro-orbital regions severe 10/10 in intensity with burning and at times throbbing quality.  There was accompanying nausea and he occasionally vomited.  He denied any light or sound sensitivity.  Headache was not relieved by sleeping as he could not sleep.  It would last for hours.  The headache initially occurred on a daily basis for several weeks.  He saw his primary physician who gave him Tylenol with codeine which did not work.  Finally he saw [name] who prescribed gabapentin on 04/03/2020 and he has been taking it 1 tablet 3 times daily and the headaches seem to have gone within a few days of starting it.  He however seems to have gained weight particularly on his belly and wants to lose it.  He denied any accompanying symptoms in the form of blurred vision, loss of vision, jaw claudication, myalgias or scalp tenderness.  He does have remote history of occasional migraines in his 10s when he had accompanying light and sound sensitivity as well.  He felt the current headaches were different.  The patient has recently gained weight he does snore but  he has never been tested for sleep apnea.  Patient has lifelong history of mild action tremor tremor involving mostly left upper extremity felt to be related to anxiety which appears stable.  He history of right brain subcortical infarct due to small vessel disease in December 2011 with only minor residual deficits on the left.  He states he has been on Plavix ever since and not missed doses no recurrent stroke or TIA symptoms for more than 10 years.  He states his blood pressure and cholesterol has been under good control but he cannot tell me when the last time it was checked.  He was last seen in our office for follow-up by [name] nurse practitioner on 01/09/2020. ?Update 05/17/2021 ; he returns for follow-up after his initial visit with me nearly a year ago.  His daughter also participated in this visit via telephone.  Patient plans to undergo elective right knee surgery by Dr. Maureen Leon next Monday and is here for neurological clearance.  Patient states he is doing well from neurological standpoint.  He has no recurrent TIA stroke symptoms since 2011.  He remains on Plavix which is tolerating well without bruising or bleeding.  He states his blood pressure is under good control.  He remains on Crestor which is tolerating well without muscle aches and pains.  His last lab work on 06/04/2020 had shown LDL cholesterol of 85 mg percent and hemoglobin A1c 5.9.  ESR was 2 mm.  I had also ordered an MRI scan of the brain at that  time because you complained of new onset headaches and MRI had shown only changes of nonspecific white matter disease.  No acute abnormality.  Patient states his headaches have gone cannot remember the last time he had a headache.  He remains on gabapentin 400 twice daily which had caused some weight gain and I suggested he taper and stop it at last visit but he has not done that.  I also referred him for evaluation for sleep apnea and he did see Dr. Arnell Leon who recommended home sleep study but  for unclear reason that has not been done either.  He also has a essential tremor for which she takes primidone 250 twice daily and is tolerating well without significant side effects and states tremor is well controlled and not functionally disabling.  He is living in independent living but does complain of mild short-term memory difficulties.  His daughter does help arrange his medications for him.  He has been driving.  He uses GPS.  He is still independent in most activities of daily living.  He has no other complaints. ?ROS:   ?14 system review of systems is positive for tremor, memory loss  , snoring and all other systems negative ? ?PMH:  ?Past Medical History:  ?Diagnosis Date  ? Depression   ? Frequent urination   ? GERD (gastroesophageal reflux disease)   ? Headache   ? mirgaines in the past  ? Herpes   ? Hyperlipidemia   ? Hypertension   ? Memory loss   ? Obesity   ? Stroke [facility])   ? Urgency of urination   ? ? ?Social History:  ?Social History  ? ?Socioeconomic History  ? Marital status: Legally Separated  ?  Spouse name: Not on file  ? Number of children: 1  ? Years of education: Masters  ? Highest education level: Not on file  ?Occupational History  ? Occupation: retired  ?  Employer: RETIRED  ?Tobacco Use  ? Smoking status: Former  ? Smokeless tobacco: Never  ?Vaping Use  ? Vaping Use: Never used  ?Substance and Sexual Activity  ? Alcohol use: No  ?  Alcohol/week: 1.0 standard drink  ?  Types: 1 Cans of beer per week  ?  Comment: socially  ? Drug use: No  ? Sexual activity: Never  ?Other Topics Concern  ? Not on file  ?Social History Narrative  ? Patient lives in [facility] @ [place]  ? Patient is right-handed.  ? Patient does not drink any caffeine.  ? ?Social Determinants of Health  ? ?Financial Resource Strain: Not on file  ?Food Insecurity: Not on file  ?Transportation Needs: Not on file  ?Physical Activity: Not on file  ?Stress: Not on file  ?Social Connections: Not on file   ?Intimate Partner Violence: Not on file  ? ? ?Medications:   ?Current Outpatient Medications on File Prior to Visit  ?Medication Sig Dispense Refill  ? amLODipine (NORVASC) 5 MG tablet Take 1 tablet (5 mg total) by mouth daily. 90 tablet 3  ? busPIRone (BUSPAR) 10 MG tablet TAKE 2 TABLETS BY MOUTH TWICE A DAY 120 tablet 2  ? candesartan (ATACAND) 32 MG tablet Take 32 mg by mouth daily.    ? clopidogrel (PLAVIX) 75 MG tablet Take 1 tablet (75 mg total) by mouth daily. 30 tablet 11  ? furosemide (LASIX) 40 MG tablet Take 1 tablet (40 mg total) by mouth 2 (two) times daily. 180 tablet 3  ? hydrALAZINE (APRESOLINE)  50 MG tablet Take 1 tablet (50 mg total) by mouth 2 (two) times daily. 270 tablet 3  ? metoprolol succinate (TOPROL-XL) 50 MG 24 hr tablet Take 50 mg by mouth daily.    ? PARoxetine (PAXIL-CR) 37.5 MG 24 hr tablet Take 1 tablet (37.5 mg total) by mouth at bedtime. 30 tablet 2  ? primidone (MYSOLINE) 250 MG tablet TAKE 1 TABLET BY MOUTH TWICE A DAY 60 tablet 12  ? QUEtiapine (SEROQUEL) 25 MG tablet TAKE 3 TABLETS BY MOUTH AT BEDTIME 90 tablet 2  ? sildenafil (VIAGRA) 50 MG tablet Take 100 mg by mouth as needed for erectile dysfunction.    ? spironolactone (ALDACTONE) 25 MG tablet Take 25 mg by mouth daily.    ? Suvorexant (BELSOMRA) 10 MG TABS Take 1 tablet by mouth at bedtime. 30 tablet 2  ? traZODone (DESYREL) 150 MG tablet Take 1 tablet (150 mg total) by mouth at bedtime as needed. for sleep 30 tablet 2  ? valACYclovir (VALTREX) 1000 MG tablet Take 1,000 mg by mouth daily.    ? Vitamin D, Ergocalciferol, (DRISDOL) 1.25 MG (50000 UNIT) CAPS capsule Take 50,000 Units by mouth once a week.    ? rosuvastatin (CRESTOR) 20 MG tablet Take 20 mg by mouth daily.    ? ?No current facility-administered medications on file prior to visit.  ? ? ?Allergies:   ?Allergies  ?Allergen Reactions  ? Aspirin Other (See Comments)  ?  Dizziness, nausea and vomiting ?Balance issues  ? Lisinopril Other (See Comments)  ?   Critical ?Cough  ? Lithium Other (See Comments)  ?  Critical ?Per pt reaction unknown  ? Oxycodone Nausea And Vomiting  ? Oxycodone Hcl Other (See Comments)  ? Oxycodone-Acetaminophen Nausea And Vomiting and Other (

## 2021-05-17 NOTE — Patient Instructions (Signed)
I had a long discussion with the patient and also spoke to his daughter over the phone regarding his impending knee surgery and gave him neurological clearance for the procedure and he may hold Plavix for 5 days prior to the procedure and resume it after it when safe with a small but acceptable periprocedural risk of TIA/stroke if patient is willing.  He is doing well with neurovascular standpoint without recurrent stroke or TIA symptoms.  Continue Plavix for stroke prevention with aggressive risk factor modification strict control of hypertension blood pressure goal below 130/90 and lipids with LDL cholesterol goal below 70 mg percent.  Continue Mysoline to 50 mg twice daily for his essential tremor which appears to be quite well controlled and is not functionally disabling.  He also has mild memory difficulties due to age-related mild cognitive impairment.  I recommend increase participation in cognitively challenging activities like solving crossword puzzles, playing bridge and sudoku.  We also discussed memory compensation strategies.  He will return for follow-up in the future in a year or call earlier if necessary. ?Memory Compensation Strategies ? ?Use "WARM" strategy. ? W= write it down ? A= associate it ? R= repeat it ? M= make a mental note ? ?2.   You can keep a Social worker. ? Use a 3-ring notebook with sections for the following: calendar, important names and phone numbers,  medications, doctors' names/phone numbers, lists/reminders, and a section to journal what you did  each day.  ? ?3.    Use a calendar to write appointments down. ? ?4.    Write yourself a schedule for the day. ? This can be placed on the calendar or in a separate section of the Memory Notebook.  Keeping a  regular schedule can help memory. ? ?5.    Use medication organizer with sections for each day or morning/evening pills. ? You may need help loading it ? ?6.    Keep a basket, or pegboard by the door. ? Place items that you  need to take out with you in the basket or on the pegboard.  You may also want to  include a message board for reminders. ? ?7.    Use sticky notes. ? Place sticky notes with reminders in a place where the task is performed.  For example: " turn off the  stove" placed by the stove, "lock the door" placed on the door at eye level, " take your medications" on  the bathroom mirror or by the place where you normally take your medications. ? ?8.    Use alarms/timers. ? Use while cooking to remind yourself to check on food or as a reminder to take your medicine, or as a  reminder to make a call, or as a reminder to perform another task, etc. ? ?

## 2021-05-18 ENCOUNTER — Telehealth: Payer: Self-pay | Admitting: Internal Medicine

## 2021-05-18 DIAGNOSIS — Z7901 Long term (current) use of anticoagulants: Secondary | ICD-10-CM | POA: Diagnosis not present

## 2021-05-18 DIAGNOSIS — R079 Chest pain, unspecified: Secondary | ICD-10-CM | POA: Diagnosis not present

## 2021-05-18 DIAGNOSIS — R519 Headache, unspecified: Secondary | ICD-10-CM | POA: Diagnosis not present

## 2021-05-18 DIAGNOSIS — Z01818 Encounter for other preprocedural examination: Secondary | ICD-10-CM | POA: Diagnosis not present

## 2021-05-18 DIAGNOSIS — R918 Other nonspecific abnormal finding of lung field: Secondary | ICD-10-CM | POA: Diagnosis not present

## 2021-05-18 DIAGNOSIS — K7689 Other specified diseases of liver: Secondary | ICD-10-CM | POA: Diagnosis not present

## 2021-05-18 DIAGNOSIS — Z6832 Body mass index (BMI) 32.0-32.9, adult: Secondary | ICD-10-CM | POA: Diagnosis not present

## 2021-05-18 NOTE — Telephone Encounter (Signed)
error 

## 2021-05-19 ENCOUNTER — Ambulatory Visit (INDEPENDENT_AMBULATORY_CARE_PROVIDER_SITE_OTHER): Payer: Medicare Other | Admitting: Physician Assistant

## 2021-05-19 ENCOUNTER — Encounter: Payer: Self-pay | Admitting: Physician Assistant

## 2021-05-19 VITALS — BP 122/62 | HR 60 | Ht 67.0 in | Wt 200.2 lb

## 2021-05-19 DIAGNOSIS — I5032 Chronic diastolic (congestive) heart failure: Secondary | ICD-10-CM | POA: Diagnosis not present

## 2021-05-19 DIAGNOSIS — I10 Essential (primary) hypertension: Secondary | ICD-10-CM

## 2021-05-19 DIAGNOSIS — R072 Precordial pain: Secondary | ICD-10-CM

## 2021-05-19 NOTE — Assessment & Plan Note (Addendum)
Volume status stable.  NYHA II-IIb.  Continue current dose of Lasix, Spironolactone.  ?

## 2021-05-19 NOTE — Progress Notes (Addendum)
?Cardiology Office Note:   ? ?Date:  05/19/2021  ? ?ID:  [name], DOB March 08, 1950, MRN [id number] ? ?PCP:  Kristie Cowman, MD  ?[facility] Providers ?Cardiologist:  Werner Lean, MD     ?Referring MD: Kristie Cowman, MD  ? ?Chief Complaint:  Chest Pain ?  ? ?Patient Profile: ?HFpEF (heart failure with preserved EF) ?Hypertension ?Hyperlipidemia ?Hx of CVA ?Hx of AKI (NSAIDs) ?Depression ?GERD ?Erectile dysfunction ? ?Prior CV Studies: ?ECHO COMPLETE WO IMAGING ENHANCING AGENT 12/15/2019 ?EF 60-65, no RWMA, mild LVH, GRII DD, normal RVSF, mild RVE, normal PASP, severe LAE, moderate RAE, mild AV sclerosis without stenosis, RVSP 23.1 ?   ? ?History of Present Illness:   ?[name] is a 71 y.o. male with the above problem list.   He was last seen by Dr. Gasper Sells in March 2023.  He was cleared for upcoming knee surgery at that visit.  He presents today for evaluation of chest pain.   He was at preop for his knee surgery and noted chest pain that occurred last week. He was asked to f/u here before proceeding with his surgery on Monday.  He notes 1 episode of chest pain that was left sided last week.  He took NTG x 1 with relief.  He has not had exertional chest pain since.  He has not had significant shortness of breath.  He has chronic leg swelling. This is stable.  His weights are stable.  He has not had orthopnea, syncope.   ?   ?Past Medical History:  ?Diagnosis Date  ? Depression   ? Frequent urination   ? GERD (gastroesophageal reflux disease)   ? Headache   ? mirgaines in the past  ? Herpes   ? Hyperlipidemia   ? Hypertension   ? Memory loss   ? Obesity   ? Stroke [facility])   ? Urgency of urination   ? ?Current Medications: ?Current Meds  ?Medication Sig  ? amLODipine (NORVASC) 5 MG tablet Take 1 tablet (5 mg total) by mouth daily.  ? busPIRone (BUSPAR) 10 MG tablet TAKE 2 TABLETS BY MOUTH TWICE A DAY  ? candesartan (ATACAND) 32 MG tablet Take 32 mg by mouth daily.  ? clopidogrel (PLAVIX) 75  MG tablet Take 1 tablet (75 mg total) by mouth daily.  ? furosemide (LASIX) 40 MG tablet Take 1 tablet (40 mg total) by mouth 2 (two) times daily.  ? gabapentin (NEURONTIN) 400 MG capsule Take 400 mg by mouth 2 (two) times daily.  ? hydrALAZINE (APRESOLINE) 50 MG tablet Take 1 tablet (50 mg total) by mouth 2 (two) times daily.  ? metoprolol succinate (TOPROL-XL) 50 MG 24 hr tablet Take 50 mg by mouth daily.  ? PARoxetine (PAXIL-CR) 37.5 MG 24 hr tablet Take 1 tablet (37.5 mg total) by mouth at bedtime.  ? primidone (MYSOLINE) 250 MG tablet TAKE 1 TABLET BY MOUTH TWICE A DAY  ? QUEtiapine (SEROQUEL) 25 MG tablet TAKE 3 TABLETS BY MOUTH AT BEDTIME  ? rosuvastatin (CRESTOR) 20 MG tablet Take 20 mg by mouth daily.  ? sildenafil (VIAGRA) 50 MG tablet Take 100 mg by mouth as needed for erectile dysfunction.  ? spironolactone (ALDACTONE) 25 MG tablet Take 25 mg by mouth daily.  ? Suvorexant (BELSOMRA) 10 MG TABS Take 1 tablet by mouth at bedtime.  ? traZODone (DESYREL) 150 MG tablet Take 1 tablet (150 mg total) by mouth at bedtime as needed. for sleep  ? valACYclovir (VALTREX) 1000 MG  tablet Take 1,000 mg by mouth daily.  ? Vitamin D, Ergocalciferol, (DRISDOL) 1.25 MG (50000 UNIT) CAPS capsule Take 50,000 Units by mouth once a week.  ?  ?Allergies:   Aspirin, Lisinopril, Lithium, Oxycodone, Oxycodone hcl, Oxycodone-acetaminophen, and Pork-derived products  ? ?Social History  ? ?Tobacco Use  ? Smoking status: Former  ? Smokeless tobacco: Never  ?Vaping Use  ? Vaping Use: Never used  ?Substance Use Topics  ? Alcohol use: No  ?  Alcohol/week: 1.0 standard drink  ?  Types: 1 Cans of beer per week  ?  Comment: socially  ? Drug use: No  ?  ?Family Hx: ?The patient's family history includes Cancer in his maternal aunt and maternal aunt; Cancer - Ovarian in his maternal grandmother; Diabetes Mellitus I in his paternal grandmother; Hypertension in his brother, father, mother, paternal grandfather, and sister. ? ?Review of Systems   ?Gastrointestinal:  Negative for hematochezia.  ?Genitourinary:  Negative for hematuria.   ? ?EKGs/Labs/Other Test Reviewed:   ? ?EKG:  EKG is   ordered today.  The ekg ordered today demonstrates normal sinus rhythm, HR 60, LAD, Right Bundle Branch Block, QTc 454 ms ? ?Recent Labs: ?07/01/2020: Magnesium 1.8 ?07/17/2020: NT-Pro BNP 95 ?05/13/2021: BUN 17; Creatinine, Ser 1.49; Hemoglobin 12.3; Platelets 309; Potassium 4.5; Sodium 137  ? ?Recent Lipid Panel ?Recent Labs  ?  06/04/20 ?1320  ?CHOL 140  ?TRIG 106  ?HDL 35*  ?[place] 85  ?  ? ?Risk Assessment/Calculations:   ?  ?    ?Physical Exam:   ? ?VS:  BP 122/62   Pulse 60   Ht '5\' 7"'$  (1.702 m)   Wt 200 lb 3.2 oz (90.8 kg)   SpO2 96%   BMI 31.36 kg/m?    ? ?Wt Readings from Last 3 Encounters:  ?05/19/21 200 lb 3.2 oz (90.8 kg)  ?05/17/21 200 lb (90.7 kg)  ?04/09/21 200 lb 12.8 oz (91.1 kg)  ?  ?Constitutional:   ?   Appearance: Healthy appearance. Not in distress.  ?Neck:  ?   Vascular: No JVR. JVD normal.  ?Pulmonary:  ?   Effort: Pulmonary effort is normal.  ?   Breath sounds: No wheezing. No rales.  ?Cardiovascular:  ?   Normal rate. Regular rhythm. Normal S1. Normal S2.   ?   Murmurs: There is no murmur.  ?Edema: ?   Peripheral edema present. ?   Pretibial: bilateral 1+ edema of the pretibial area. ?   Comments: W chronic changes ?Abdominal:  ?   Palpations: Abdomen is soft.  ?Skin: ?   General: Skin is warm and dry.  ?Neurological:  ?   General: No focal deficit present.  ?   Mental Status: Alert and oriented to person, place and time.  ?   Cranial Nerves: Cranial nerves are intact.  ?   ?    ?ASSESSMENT & PLAN:   ?Precordial chest pain ?Etiology not clear.  He has several risk factors for CAD. His EKG does not show any acute changes.  He has not had any recurrent symptoms.  However, he did have relief promptly with NTG.  He also has knee surgery scheduled for Monday.   ?Lexiscan Myoview tomorrow ?I will fwd results to ortho and PCP when available ?If low  risk Myoview, no further work up needed unless he has recurrent/more severe symptoms.  ? ?Chronic heart failure with preserved ejection fraction ([place]) ?Volume status stable.  NYHA II-IIb.  Continue current dose of Lasix, Spironolactone.  ? ?  Essential hypertension ?The patient's blood pressure is controlled on his current regimen.  Continue norvasc 5 mg once daily, candesartan 32 mg once daily, hydralazine 50 mg twice daily, Toprol XL 50 mg once daily, spironolactone 25 mg once daily.    ?  ?     ?Shared Decision Making/Informed Consent ?The risks [chest pain, shortness of breath, cardiac arrhythmias, dizziness, blood pressure fluctuations, myocardial infarction, stroke/transient ischemic attack, nausea, vomiting, allergic reaction, radiation exposure, metallic taste sensation and life-threatening complications (estimated to be 1 in 10,000)], benefits (risk stratification, diagnosing coronary artery disease, treatment guidance) and alternatives of a nuclear stress test were discussed in detail with [name] and he agrees to proceed.  ? ?ADDENDUM: ?GATED SPECT MYO PERF W/LEXISCAN STRESS 1D 05/20/2021 ?Narrative ?  The study is normal. The study is low risk. ?  No ST deviation was noted. ?  LV perfusion is normal. ?  Left ventricular function is normal. Nuclear stress EF: 77 %. The left ventricular ejection fraction is hyperdynamic (>65%). End diastolic cavity size is normal. End systolic cavity size is normal. ?  Prior study not available for comparison. ?Normal perfusion. LVEF 77% with normal wall motion. This is a low risk study. No prior for comparison.  ?Low risk stress test.  Patient may proceed with surgery at acceptable risk. ? ?Dispo:  Return for Scheduled Follow Up w Dr. Gasper Sells.  ? ?Medication Adjustments/Labs and Tests Ordered: ?Current medicines are reviewed at length with the patient today.  Concerns regarding medicines are outlined above.  ?Tests Ordered: ?Orders Placed This Encounter   ?Procedures  ? Cardiac Stress Test: Informed Consent Details: Physician/Practitioner Attestation; Transcribe to consent form and obtain patient signature  ? MYOCARDIAL PERFUSION IMAGING  ? EKG 12-Lead  ? ?Medication Cha

## 2021-05-19 NOTE — Assessment & Plan Note (Signed)
Etiology not clear.  He has several risk factors for CAD. His EKG does not show any acute changes.  He has not had any recurrent symptoms.  However, he did have relief promptly with NTG.  He also has knee surgery scheduled for Monday.   ?? Lexiscan Myoview tomorrow ?? I will fwd results to ortho and PCP when available ?? If low risk Myoview, no further work up needed unless he has recurrent/more severe symptoms.  ?

## 2021-05-19 NOTE — Assessment & Plan Note (Addendum)
The patient's blood pressure is controlled on his current regimen.  Continue norvasc 5 mg once daily, candesartan 32 mg once daily, hydralazine 50 mg twice daily, Toprol XL 50 mg once daily, spironolactone 25 mg once daily.    ?

## 2021-05-19 NOTE — Patient Instructions (Addendum)
Medication Instructions:  ?Your physician recommends that you continue on your current medications as directed. Please refer to the Current Medication list given to you today. ? ?*If you need a refill on your cardiac medications before your next appointment, please call your pharmacy* ? ? ?Lab Work: ?None ordered ? ?If you have labs (blood work) drawn today and your tests are completely normal, you will receive your results only by: ?MyChart Message (if you have MyChart) OR ?A paper copy in the mail ?If you have any lab test that is abnormal or we need to change your treatment, we will call you to review the results. ? ? ?Testing/Procedures: ?Your physician has requested that you have a lexiscan myoview. For further information please visit [email or web address]. Please follow instruction sheet,  BELOW: ? ? ? ?You are scheduled for a Myocardial Perfusion Imaging Study TOMORROW 05/19/21 ARRIVE AT 7:45 ? ?Please arrive 15 minutes prior to your appointment time for registration and insurance purposes. ? ?The test will take approximately 3 to 4 hours to complete; you may bring reading material.  If someone comes with you to your appointment, they will need to remain in the main lobby due to limited space in the testing area. **If you are pregnant or breastfeeding, please notify the nuclear lab prior to your appointment** ? ?How to prepare for your Myocardial Perfusion Test: ?Do not eat or drink 3 hours prior to your test, except you may have water. ?Do not consume products containing caffeine (regular or decaffeinated) 12 hours prior to your test. (ex: coffee, chocolate, sodas, tea). ?Do bring a list of your current medications with you.  If not listed below, you may take your medications as normal. ?Do wear comfortable clothes (no dresses or overalls) and walking shoes, tennis shoes preferred (No heels or open toe shoes are allowed). ?Do NOT wear cologne, perfume, aftershave, or lotions (deodorant is allowed). ?If these  instructions are not followed, your test will have to be rescheduled. ? ?. ? ? ? ?Follow-Up: ?At [facility], you and your health needs are our priority.  As part of our continuing mission to provide you with exceptional heart care, we have created designated Provider Care Teams.  These Care Teams include your primary Cardiologist (physician) and Advanced Practice Providers (APPs -  Physician Assistants and Nurse Practitioners) who all work together to provide you with the care you need, when you need it. ? ?We recommend signing up for the patient portal called "MyChart".  Sign up information is provided on this After Visit Summary.  MyChart is used to connect with patients for Virtual Visits (Telemedicine).  Patients are able to view lab/test results, encounter notes, upcoming appointments, etc.  Non-urgent messages can be sent to your provider as well.   ?To learn more about what you can do with MyChart, go to [email or web address].   ? ?Your next appointment:   ?AS PLANNED ? ?The format for your next appointment:   ?In Person ? ?Provider:   ?DR. CHANDRASEKHAR  ? ? ?Other Instructions ? ? ?Important Information About Sugar ? ? ? ? ?  ?

## 2021-05-20 ENCOUNTER — Ambulatory Visit (HOSPITAL_COMMUNITY): Payer: Medicare Other | Attending: Cardiology

## 2021-05-20 ENCOUNTER — Encounter: Payer: Self-pay | Admitting: Physician Assistant

## 2021-05-20 DIAGNOSIS — I5032 Chronic diastolic (congestive) heart failure: Secondary | ICD-10-CM | POA: Diagnosis not present

## 2021-05-20 DIAGNOSIS — R072 Precordial pain: Secondary | ICD-10-CM | POA: Diagnosis not present

## 2021-05-20 LAB — MYOCARDIAL PERFUSION IMAGING
LV dias vol: 90 mL (ref 62–150)
LV sys vol: 21 mL
Nuc Stress EF: 77 %
Peak HR: 82 {beats}/min
Rest HR: 55 {beats}/min
Rest Nuclear Isotope Dose: 10.2 mCi
SDS: 0
SRS: 0
SSS: 0
ST Depression (mm): 0 mm
Stress Nuclear Isotope Dose: 31.6 mCi
TID: 0.89

## 2021-05-20 MED ORDER — TECHNETIUM TC 99M TETROFOSMIN IV KIT
10.2000 | PACK | Freq: Once | INTRAVENOUS | Status: AC | PRN
  Administered 2021-05-20: 10.2 via INTRAVENOUS
  Filled 2021-05-20: qty 11

## 2021-05-20 MED ORDER — TECHNETIUM TC 99M TETROFOSMIN IV KIT
31.6000 | PACK | Freq: Once | INTRAVENOUS | Status: AC | PRN
  Administered 2021-05-20: 31.6 via INTRAVENOUS
  Filled 2021-05-20: qty 32

## 2021-05-20 MED ORDER — REGADENOSON 0.4 MG/5ML IV SOLN
0.4000 mg | Freq: Once | INTRAVENOUS | Status: AC
  Administered 2021-05-20: 0.4 mg via INTRAVENOUS

## 2021-05-21 ENCOUNTER — Encounter (HOSPITAL_COMMUNITY): Payer: Self-pay

## 2021-05-21 DIAGNOSIS — R2681 Unsteadiness on feet: Secondary | ICD-10-CM | POA: Diagnosis not present

## 2021-05-21 DIAGNOSIS — M6281 Muscle weakness (generalized): Secondary | ICD-10-CM | POA: Diagnosis not present

## 2021-05-21 DIAGNOSIS — M25561 Pain in right knee: Secondary | ICD-10-CM | POA: Diagnosis not present

## 2021-05-21 NOTE — Anesthesia Preprocedure Evaluation (Deleted)
Anesthesia Evaluation  ° ° °Airway ° ° ° ° ° ° ° Dental °  °Pulmonary °former smoker,  °  ° ° ° ° ° ° ° Cardiovascular °hypertension,  ° ° °  °Neuro/Psych °  ° GI/Hepatic °  °Endo/Other  ° ° Renal/GU °  ° °  °Musculoskeletal ° ° Abdominal °  °Peds ° Hematology °  °Anesthesia Other Findings ° ° Reproductive/Obstetrics ° °  ° ° ° ° ° ° ° ° ° ° ° ° ° °  °  ° ° ° ° ° ° ° ° °Anesthesia Physical °Anesthesia Plan ° °ASA:  ° °Anesthesia Plan:   ° °Post-op Pain Management:   ° °Induction:  ° °PONV Risk Score and Plan:  ° °Airway Management Planned:  ° °Additional Equipment:  ° °Intra-op Plan:  ° °Post-operative Plan:  ° °Informed Consent:  ° °Plan Discussed with:  ° °Anesthesia Plan Comments: (See APP note by A. Rasa Degrazia, FNP )  ° ° ° ° ° ° °Anesthesia Quick Evaluation ° °

## 2021-05-21 NOTE — Progress Notes (Signed)
Anesthesia Chart Review: ? ? Case: [id number] Date/Time: 05/24/21 1120  ? Procedure: TOTAL KNEE ARTHROPLASTY (Right: Knee)  ? Anesthesia type: Choice  ? Pre-op diagnosis: right knee osteoarthritis  ? Location: [facility] ROOM 09 / [facility]  ? Surgeons: Gaynelle Arabian, MD  ? ?  ? ? ?DISCUSSION: ?Pt is 71 years old with hx HTN, HFpEF, stroke, memory loss ? ?Pt to hold plavix 5 days before surgery ? ? ?VS: BP 112/60   Pulse 66   Temp 36.9 ?C (Oral)   Resp 16   Ht '5\' 7"'$  (1.702 m)   SpO2 99%   BMI 31.45 kg/m?  ? ?PROVIDERS: ?- PCP is Kristie Cowman, MD ? ?- Cardiologist is Rudean Haskell, MD. Last office visit 05/19/21 with Richardson Dopp, PA. Cleared for surgery in comment on stress test 05/20/21 ? ?- Neurololgist is Antony Contras, MD who sees pt for headaches, memory loss, and remote stroke. Gleared for surgery and given ok to hold plavix at last office visit 05/17/21 ? ? ?LABS: Labs reviewed: Acceptable for surgery. ?(all labs ordered are listed, but only abnormal results are displayed) ? ?Labs Reviewed  ?SURGICAL PCR SCREEN - Abnormal; Notable for the following components:  ?    Result Value  ? Staphylococcus aureus POSITIVE (*)   ? All other components within normal limits  ?BASIC METABOLIC PANEL - Abnormal; Notable for the following components:  ? Glucose, Bld 102 (*)   ? Creatinine, Ser 1.49 (*)   ? GFR, Estimated 50 (*)   ? All other components within normal limits  ?CBC - Abnormal; Notable for the following components:  ? RBC 3.64 (*)   ? Hemoglobin 12.3 (*)   ? HCT 36.4 (*)   ? All other components within normal limits  ? ? ? ?EKG 04/09/21: SR. RBBB ? ? ?CV: ?Nuclear stress test 05/20/21:  ?  The study is normal. The study is low risk. ?  No ST deviation was noted. ?  LV perfusion is normal. ?  Left ventricular function is normal. Nuclear stress EF: 77 %. The left ventricular ejection fraction is hyperdynamic (>65%). End diastolic cavity size is normal. End systolic cavity size is normal. ?  Prior study not available for  comparison. ? ?Echo 12/15/19:  ?1. Left ventricular ejection fraction, by estimation, is 60 to 65%. The left ventricle has normal function. The left ventricle has no regional wall motion abnormalities. There is mild concentric left ventricular hypertrophy. Left ventricular diastolic parameters are consistent with Grade II diastolic dysfunction (pseudonormalization). Elevated left atrial pressure.  ?2. Right ventricular systolic function is normal. The right ventricular  ?size is mildly enlarged. There is normal pulmonary artery systolic pressure.  ?3. Left atrial size was severely dilated.  ?4. Right atrial size was moderately dilated.  ?5. The mitral valve is normal in structure. No evidence of mitral valve regurgitation. No evidence of mitral stenosis.  ?6. The aortic valve is normal in structure. Aortic valve regurgitation is not visualized. Mild aortic valve sclerosis is present, with no evidence of aortic valve stenosis.  ?7. The inferior vena cava is normal in size with greater than 50% respiratory variability, suggesting right atrial pressure of 3 mmHg.  ? ? ?Past Medical History:  ?Diagnosis Date  ? Depression   ? Frequent urination   ? GERD (gastroesophageal reflux disease)   ? Headache   ? mirgaines in the past  ? Herpes   ? History of nuclear stress test   ? Myoview 05/2021: EF 77, normal  perfusion, low risk  ? Hyperlipidemia   ? Hypertension   ? Memory loss   ? Obesity   ? Stroke [facility])   ? Urgency of urination   ? ? ?Past Surgical History:  ?Procedure Laterality Date  ? CHOLECYSTECTOMY N/A 12/17/2019  ? Procedure: LAPAROSCOPIC CHOLECYSTECTOMY WITH ICG INJECTION;  Surgeon: Rolm Bookbinder, MD;  Location: [facility];  Service: General;  Laterality: N/A;  ? disckec    ? SPINAL FUSION    ? TONSILLECTOMY    ? VASECTOMY    ? no per pt  ? WISDOM TOOTH EXTRACTION    ? ? ?MEDICATIONS: ? amLODipine (NORVASC) 5 MG tablet  ? busPIRone (BUSPAR) 10 MG tablet  ? candesartan (ATACAND) 32 MG tablet  ? clopidogrel (PLAVIX)  75 MG tablet  ? furosemide (LASIX) 40 MG tablet  ? gabapentin (NEURONTIN) 400 MG capsule  ? hydrALAZINE (APRESOLINE) 50 MG tablet  ? metoprolol succinate (TOPROL-XL) 50 MG 24 hr tablet  ? PARoxetine (PAXIL-CR) 37.5 MG 24 hr tablet  ? primidone (MYSOLINE) 250 MG tablet  ? QUEtiapine (SEROQUEL) 25 MG tablet  ? rosuvastatin (CRESTOR) 20 MG tablet  ? sildenafil (VIAGRA) 50 MG tablet  ? spironolactone (ALDACTONE) 25 MG tablet  ? Suvorexant (BELSOMRA) 10 MG TABS  ? traZODone (DESYREL) 150 MG tablet  ? valACYclovir (VALTREX) 1000 MG tablet  ? Vitamin D, Ergocalciferol, (DRISDOL) 1.25 MG (50000 UNIT) CAPS capsule  ? ?No current facility-administered medications for this encounter.  ? ?- Pt to hold plavix 5 days before surgery ? ? ?If no changes, I anticipate pt can proceed with surgery as scheduled.  ? ?Willeen Cass, PhD, FNP-BC ?[facility]/Anesthesiology ?Phone: [phone] ?05/21/2021 8:43 AM ? ? ? ? ? ? ? ?

## 2021-05-24 ENCOUNTER — Encounter (HOSPITAL_COMMUNITY): Admission: RE | Payer: Self-pay | Source: Ambulatory Visit

## 2021-05-24 ENCOUNTER — Ambulatory Visit (HOSPITAL_COMMUNITY): Admission: RE | Admit: 2021-05-24 | Payer: Medicare Other | Source: Ambulatory Visit | Admitting: Orthopedic Surgery

## 2021-05-24 SURGERY — ARTHROPLASTY, KNEE, TOTAL
Anesthesia: Choice | Site: Knee | Laterality: Right

## 2021-05-24 NOTE — Progress Notes (Signed)
Pt has been made aware of normal result and verbalized understanding.  jw

## 2021-05-27 DIAGNOSIS — R2681 Unsteadiness on feet: Secondary | ICD-10-CM | POA: Diagnosis not present

## 2021-05-27 DIAGNOSIS — M25561 Pain in right knee: Secondary | ICD-10-CM | POA: Diagnosis not present

## 2021-05-27 DIAGNOSIS — M6281 Muscle weakness (generalized): Secondary | ICD-10-CM | POA: Diagnosis not present

## 2021-05-28 DIAGNOSIS — M1711 Unilateral primary osteoarthritis, right knee: Secondary | ICD-10-CM | POA: Diagnosis not present

## 2021-06-03 DIAGNOSIS — M6281 Muscle weakness (generalized): Secondary | ICD-10-CM | POA: Diagnosis not present

## 2021-06-03 DIAGNOSIS — R2681 Unsteadiness on feet: Secondary | ICD-10-CM | POA: Diagnosis not present

## 2021-06-03 DIAGNOSIS — M25561 Pain in right knee: Secondary | ICD-10-CM | POA: Diagnosis not present

## 2021-06-10 DIAGNOSIS — F332 Major depressive disorder, recurrent severe without psychotic features: Secondary | ICD-10-CM | POA: Diagnosis not present

## 2021-06-29 ENCOUNTER — Ambulatory Visit (INDEPENDENT_AMBULATORY_CARE_PROVIDER_SITE_OTHER): Payer: Medicare Other | Admitting: Psychiatry

## 2021-06-29 ENCOUNTER — Encounter (HOSPITAL_COMMUNITY): Payer: Self-pay | Admitting: Psychiatry

## 2021-06-29 DIAGNOSIS — Z6832 Body mass index (BMI) 32.0-32.9, adult: Secondary | ICD-10-CM | POA: Diagnosis not present

## 2021-06-29 DIAGNOSIS — F419 Anxiety disorder, unspecified: Secondary | ICD-10-CM | POA: Diagnosis not present

## 2021-06-29 DIAGNOSIS — F3342 Major depressive disorder, recurrent, in full remission: Secondary | ICD-10-CM | POA: Diagnosis not present

## 2021-06-29 DIAGNOSIS — R7303 Prediabetes: Secondary | ICD-10-CM | POA: Diagnosis not present

## 2021-06-29 DIAGNOSIS — E559 Vitamin D deficiency, unspecified: Secondary | ICD-10-CM | POA: Diagnosis not present

## 2021-06-29 DIAGNOSIS — I10 Essential (primary) hypertension: Secondary | ICD-10-CM | POA: Diagnosis not present

## 2021-06-29 DIAGNOSIS — E7800 Pure hypercholesterolemia, unspecified: Secondary | ICD-10-CM | POA: Diagnosis not present

## 2021-06-29 MED ORDER — PAROXETINE HCL ER 37.5 MG PO TB24: 37.5000 mg | ORAL_TABLET | Freq: Every day | ORAL | 3 refills | Status: DC

## 2021-06-29 MED ORDER — BUSPIRONE HCL 15 MG PO TABS: 30.0000 mg | ORAL_TABLET | Freq: Three times a day (TID) | ORAL | 3 refills | Status: DC

## 2021-06-29 MED ORDER — TRAZODONE HCL 150 MG PO TABS: 150.0000 mg | ORAL_TABLET | Freq: Every evening | ORAL | 3 refills | Status: DC | PRN

## 2021-06-29 MED ORDER — QUETIAPINE FUMARATE 25 MG PO TABS: ORAL_TABLET | ORAL | 3 refills | Status: DC

## 2021-06-29 NOTE — Progress Notes (Signed)
[place] MD/PA/NP OP Progress Note Virtual Visit via Telephone Note  I connected with [name] on 06/29/21 at  3:30 PM EDT by telephone and verified that I am speaking with the correct person using two identifiers.  Location: Patient: home Provider: Home office   I discussed the limitations, risks, security and privacy concerns of performing an evaluation and management service by telephone and the availability of in person appointments. I also discussed with the patient that there may be a patient responsible charge related to this service. The patient expressed understanding and agreed to proceed.   I provided 30 minutes of non-face-to-face time during this encounter.  06/29/2021 1:59 PM [name]  MRN:  [id number]  Chief Complaint: "I am anxious at time and I can sleep all day"    HPI: 71 year old male seen today for follow-up psychiatric evaluation.  He has a psychiatric history of depression, anxiety, and TD.  He is currently managed on BuSpar 10 mg twice daily,  Paxil 37.5 mg daily, Seroquel 25 mg nightly, and trazodone 150 mg nightly as needed.  He notes his medications are somewhat effective in managing his psychiatric conditions.  Today he was unable to login virtually so his assessment was done over the phone. During exam he was pleasant, cooperative, and engaged in conversation.  He informed Probation officer that at times he is anxious.  He notes that his anxiety stems from his girlfriend.  He reports that she becomes angry when he speaks to his male peers at his independent living facility.  He reports that he is thinking about breaking up with her to reduce his stress.  Today provider conducted a GAD-7 and patient scored a 6.  Provider also conducted a PHQ-9 and patient scored 8.  Patient notes that when he is anxious his legs shakes.  He informed Probation officer that he takes an extra BuSpar.  Provider informed patient that he can take BuSpar 3 times a day.  He notes at times is difficult  for him to remember to take BuSpar.  Today he denies SI/HI/VH, mania, paranoia.  Patient informed writer that he sleeps 10 to 12 hours daily and notes that at times she naps during the day.  Provider asked patient if he was taking trazodone scheduled instead of as needed.  He notes that he was taking trazodone nightly.  Provider informed patient that trazodone could be causing his daytime sedation.  Provider instructed patient to take medication as needed.  He endorsed understanding however notes that his medications are in peeled packages which are prepared by the pharmacy.  Provider sent a message to pharmacy instructing them to separate his trazodone.  Patient was grateful. Today BuSpar 10 mg twice daily increased to 15 mg twice daily to help manage anxiety and depression.  Patient will continue all other medications as prescribed.  Patient informed Probation officer that he would like to be seen in person at his next visit.  Provided endorsed understanding and scheduled an in person visit with patient.  No other concerns noted at this time.  Visit Diagnosis:    ICD-10-CM   1. Recurrent major depressive disorder, in full remission ([place])  F33.42 traZODone (DESYREL) 150 MG tablet    PARoxetine (PAXIL-CR) 37.5 MG 24 hr tablet    QUEtiapine (SEROQUEL) 25 MG tablet    busPIRone (BUSPAR) 15 MG tablet    2. Anxiety  F41.9 busPIRone (BUSPAR) 15 MG tablet      Past Psychiatric History:  MDD, TD, anxiety  Past  Medical History:  Past Medical History:  Diagnosis Date   (HFpEF) heart failure with preserved ejection fraction (HCC)    Depression    Frequent urination    GERD (gastroesophageal reflux disease)    Headache    mirgaines in the past   Herpes    History of nuclear stress test    Myoview 05/2021: EF 77, normal perfusion, low risk   Hyperlipidemia    Hypertension    Memory loss    Obesity    Stroke [facility])    Urgency of urination     Past Surgical History:  Procedure Laterality Date    CHOLECYSTECTOMY N/A 12/17/2019   Procedure: LAPAROSCOPIC CHOLECYSTECTOMY WITH ICG INJECTION;  Surgeon: Rolm Bookbinder, MD;  Location: [facility];  Service: General;  Laterality: N/A;   disckec     SPINAL FUSION     TONSILLECTOMY     VASECTOMY     no per pt   WISDOM TOOTH EXTRACTION      Family Psychiatric History: Denies  Family History:  Family History  Problem Relation Age of Onset   Hypertension Mother    Hypertension Father    Hypertension Sister    Hypertension Brother    Cancer Maternal Aunt    Cancer - Ovarian Maternal Grandmother    Diabetes Mellitus I Paternal Grandmother    Hypertension Paternal Grandfather    Cancer Maternal Aunt     Social History:  Social History   Socioeconomic History   Marital status: Divorced    Spouse name: Not on file   Number of children: 1   Years of education: Masters   Highest education level: Not on file  Occupational History   Occupation: retired    Fish farm manager: RETIRED  Tobacco Use   Smoking status: Former   Smokeless tobacco: Never  Scientific laboratory technician Use: Never used  Substance and Sexual Activity   Alcohol use: No    Alcohol/week: 1.0 standard drink of alcohol    Types: 1 Cans of beer per week    Comment: socially   Drug use: No   Sexual activity: Never  Other Topics Concern   Not on file  Social History Narrative   Patient lives in [facility] @ [place]   Patient is right-handed.   Patient does not drink any caffeine.   Social Determinants of Health   Financial Resource Strain: Not on file  Food Insecurity: Not on file  Transportation Needs: Not on file  Physical Activity: Not on file  Stress: Not on file  Social Connections: Not on file    Allergies:  Allergies  Allergen Reactions   Aspirin Other (See Comments)    Dizziness, nausea and vomiting Balance issues   Lisinopril Other (See Comments)    Critical Cough   Lithium Other (See Comments)    Critical Per pt reaction unknown    Oxycodone Nausea And Vomiting   Oxycodone Hcl Other (See Comments)   Oxycodone-Acetaminophen Nausea And Vomiting and Other (See Comments)    Also causes dizziness   Pork-Derived Products Other (See Comments)    Unspecified reaction    Metabolic Disorder Labs: Lab Results  Component Value Date   HGBA1C 5.9 (H) 06/04/2020   MPG 114.02 12/17/2019   MPG 120 (H) 12/20/2009   No results found for: "PROLACTIN" Lab Results  Component Value Date   CHOL 140 06/04/2020   TRIG 106 06/04/2020   HDL 35 (L) 06/04/2020   CHOLHDL 4.0 06/04/2020   VLDL  36 12/17/2019   LDLCALC 85 06/04/2020   LDLCALC 109 (H) 12/17/2019   Lab Results  Component Value Date   TSH 2.605 12/15/2019   TSH 2.850 10/16/2013    Therapeutic Level Labs: No results found for: "LITHIUM" No results found for: "VALPROATE" Lab Results  Component Value Date   CBMZ 2.0 (L) 10/29/2010    Current Medications: Current Outpatient Medications  Medication Sig Dispense Refill   busPIRone (BUSPAR) 15 MG tablet Take 2 tablets (30 mg total) by mouth 3 (three) times daily. 60 tablet 3   amLODipine (NORVASC) 5 MG tablet Take 1 tablet (5 mg total) by mouth daily. 90 tablet 3   busPIRone (BUSPAR) 10 MG tablet TAKE 2 TABLETS BY MOUTH TWICE A DAY 120 tablet 2   candesartan (ATACAND) 32 MG tablet Take 32 mg by mouth daily.     clopidogrel (PLAVIX) 75 MG tablet Take 1 tablet (75 mg total) by mouth daily. 30 tablet 11   furosemide (LASIX) 40 MG tablet Take 1 tablet (40 mg total) by mouth 2 (two) times daily. 180 tablet 3   gabapentin (NEURONTIN) 400 MG capsule Take 400 mg by mouth 2 (two) times daily.     hydrALAZINE (APRESOLINE) 50 MG tablet Take 1 tablet (50 mg total) by mouth 2 (two) times daily. 270 tablet 3   metoprolol succinate (TOPROL-XL) 50 MG 24 hr tablet Take 50 mg by mouth daily.     PARoxetine (PAXIL-CR) 37.5 MG 24 hr tablet Take 1 tablet (37.5 mg total) by mouth at bedtime. 30 tablet 3   primidone (MYSOLINE) 250 MG tablet  TAKE 1 TABLET BY MOUTH TWICE A DAY 60 tablet 12   QUEtiapine (SEROQUEL) 25 MG tablet TAKE 3 TABLETS BY MOUTH AT BEDTIME 90 tablet 3   rosuvastatin (CRESTOR) 20 MG tablet Take 20 mg by mouth daily.     sildenafil (VIAGRA) 50 MG tablet Take 100 mg by mouth as needed for erectile dysfunction.     spironolactone (ALDACTONE) 25 MG tablet Take 25 mg by mouth daily.     Suvorexant (BELSOMRA) 10 MG TABS Take 1 tablet by mouth at bedtime. 30 tablet 2   traZODone (DESYREL) 150 MG tablet Take 1 tablet (150 mg total) by mouth at bedtime as needed. for sleep 30 tablet 3   valACYclovir (VALTREX) 1000 MG tablet Take 1,000 mg by mouth daily.     Vitamin D, Ergocalciferol, (DRISDOL) 1.25 MG (50000 UNIT) CAPS capsule Take 50,000 Units by mouth once a week.     No current facility-administered medications for this visit.     Musculoskeletal: Strength & Muscle Tone:  Unable to assess due to telephone visit Gait & Station:  Unable to assess due to telephone visit Patient leans: N/A  Psychiatric Specialty Exam: Review of Systems  There were no vitals taken for this visit.There is no height or weight on file to calculate BMI.  General Appearance: Unable to assess due to telephone visit  Eye Contact:   Unable to assess due to telephone visit  Speech:  Clear and Coherent and Normal Rate  Volume:  Normal  Mood:  Euthymic, situational anxiety  Affect:  Appropriate and Congruent  Thought Process:  Coherent, Goal Directed, and Linear  Orientation:  Full (Time, Place, and Person)  Thought Content: WDL and Logical   Suicidal Thoughts:  No  Homicidal Thoughts:  No  Memory:  Immediate;   Good Recent;   Good Remote;   Good  Judgement:  Good  Insight:  Good  Psychomotor Activity:   Unable to assess due to telephone visit  Concentration:  Concentration: Good and Attention Span: Good  Recall:  Good  Fund of Knowledge: Good  Language: Good  Akathisia:   Unable to assess due to telephone visit  Handed:  Right   AIMS (if indicated): not done  Assets:  Communication Skills Desire for Improvement Financial Resources/Insurance Housing Intimacy Physical Health Social Support  ADL's:  Intact  Cognition: WNL  Sleep:  Fair   Screenings: AIMS    Flowsheet Row Clinical Support from 09/24/2020 in [facility] Admission (Discharged) from OP Visit from 02/02/2019 in [facility] Total Score 1 12      AUDIT    Flowsheet Row Admission (Discharged) from OP Visit from 02/02/2019 in [facility]  Alcohol Use Disorder Identification Test Final Score (AUDIT) 0      GAD-7    Flowsheet Row Office Visit from 06/29/2021 in [facility] Video Visit from 12/29/2020 in [facility] from 09/24/2020 in [facility]  Total GAD-7 Score 6 1 0      Mini-Mental    [place] Office Visit from 01/09/2020 in [facility] Office Visit from 03/25/2019 in [facility] Office Visit from 09/12/2018 in [facility] Office Visit from 03/12/2018 in [facility] Office Visit from 09/26/2017 in [facility]  Total Score (max 30 points ) '27 24 24 28 28      '$ PHQ2-9    [place] Visit from 06/29/2021 in [facility] Video Visit from 12/29/2020 in [facility] from 09/24/2020 in [facility]  PHQ-2 Total Score 0 0 0  PHQ-9 Total Score '8 2 4      '$ Flowsheet Row Pre-Admission Testing 60 from 05/13/2021 in [facility] ED from 03/24/2020 in [facility] DEPT Admission (Discharged) from OP Visit from 02/02/2019 in [facility] No Risk No Risk Error: Question  1 not populated        Assessment and Plan: Patient endorses hypersomnia and mild anxiety and depression.  Provider asked patient if he was taking trazodone scheduled instead of as needed.  He notes that he was taking trazodone nightly.  Provider informed patient that trazodone could be causing his daytime sedation.  Provider instructed patient to take medication as needed.  He endorsed understanding however notes that his medications are in peeled packages which are prepared by the pharmacy.  Provider sent a message to pharmacy instructing them to separate his trazodone.  Patient was grateful. Today BuSpar 10 mg twice daily increased to 15 mg twice daily to help manage anxiety and depression.  Patient will continue all other medications as prescribed.  Patient informed Probation officer that he would like to be seen in person at his next visit.  Provided endorsed understanding and scheduled an in person visit with patient.    1. Recurrent major depressive disorder, in full remission ([place])  Continue- traZODone (DESYREL) 150 MG tablet; Take 1 tablet (150 mg total) by mouth at bedtime as needed. for sleep  Dispense: 30 tablet; Refill: 3 Continue- PARoxetine (PAXIL-CR) 37.5 MG 24 hr tablet; Take 1 tablet (37.5 mg total) by mouth at bedtime.  Dispense: 30 tablet; Refill: 3 Continue- QUEtiapine (SEROQUEL) 25 MG tablet; TAKE 3 TABLETS BY MOUTH AT BEDTIME  Dispense: 90 tablet; Refill: 3 Increased- busPIRone (BUSPAR) 15 MG tablet; Take 2 tablets (30 mg total) by mouth 3 (three) times daily.  Dispense: 60 tablet; Refill: 3  2. Anxiety  Increased- busPIRone (BUSPAR) 15 MG tablet; Take 2 tablets (30 mg total) by mouth 3 (three) times daily.  Dispense: 60 tablet; Refill: 3   Follow-up in 3 months   Salley Slaughter, NP 06/29/2021, 1:59 PM

## 2021-06-30 ENCOUNTER — Other Ambulatory Visit (HOSPITAL_COMMUNITY): Payer: Self-pay | Admitting: Psychiatry

## 2021-06-30 DIAGNOSIS — F419 Anxiety disorder, unspecified: Secondary | ICD-10-CM

## 2021-06-30 DIAGNOSIS — F3342 Major depressive disorder, recurrent, in full remission: Secondary | ICD-10-CM

## 2021-06-30 MED ORDER — BUSPIRONE HCL 15 MG PO TABS: 30.0000 mg | ORAL_TABLET | Freq: Two times a day (BID) | ORAL | 3 refills | Status: DC

## 2021-07-08 DIAGNOSIS — F332 Major depressive disorder, recurrent severe without psychotic features: Secondary | ICD-10-CM | POA: Diagnosis not present

## 2021-07-13 DIAGNOSIS — L989 Disorder of the skin and subcutaneous tissue, unspecified: Secondary | ICD-10-CM | POA: Diagnosis not present

## 2021-07-13 DIAGNOSIS — Z6832 Body mass index (BMI) 32.0-32.9, adult: Secondary | ICD-10-CM | POA: Diagnosis not present

## 2021-07-13 DIAGNOSIS — R7303 Prediabetes: Secondary | ICD-10-CM | POA: Diagnosis not present

## 2021-07-13 DIAGNOSIS — I10 Essential (primary) hypertension: Secondary | ICD-10-CM | POA: Diagnosis not present

## 2021-07-13 DIAGNOSIS — E7800 Pure hypercholesterolemia, unspecified: Secondary | ICD-10-CM | POA: Diagnosis not present

## 2021-07-16 DIAGNOSIS — Z136 Encounter for screening for cardiovascular disorders: Secondary | ICD-10-CM | POA: Diagnosis not present

## 2021-07-16 DIAGNOSIS — N183 Chronic kidney disease, stage 3 unspecified: Secondary | ICD-10-CM | POA: Diagnosis not present

## 2021-07-27 DIAGNOSIS — F332 Major depressive disorder, recurrent severe without psychotic features: Secondary | ICD-10-CM | POA: Diagnosis not present

## 2021-07-27 DIAGNOSIS — R918 Other nonspecific abnormal finding of lung field: Secondary | ICD-10-CM | POA: Diagnosis not present

## 2021-08-05 DIAGNOSIS — M5451 Vertebrogenic low back pain: Secondary | ICD-10-CM | POA: Diagnosis not present

## 2021-08-10 ENCOUNTER — Other Ambulatory Visit (HOSPITAL_COMMUNITY): Payer: Self-pay | Admitting: Psychiatry

## 2021-08-10 DIAGNOSIS — F3342 Major depressive disorder, recurrent, in full remission: Secondary | ICD-10-CM

## 2021-08-11 DIAGNOSIS — N529 Male erectile dysfunction, unspecified: Secondary | ICD-10-CM | POA: Diagnosis not present

## 2021-08-11 DIAGNOSIS — R7303 Prediabetes: Secondary | ICD-10-CM | POA: Diagnosis not present

## 2021-08-11 DIAGNOSIS — Z136 Encounter for screening for cardiovascular disorders: Secondary | ICD-10-CM | POA: Diagnosis not present

## 2021-08-11 DIAGNOSIS — E7800 Pure hypercholesterolemia, unspecified: Secondary | ICD-10-CM | POA: Diagnosis not present

## 2021-08-11 DIAGNOSIS — Z6831 Body mass index (BMI) 31.0-31.9, adult: Secondary | ICD-10-CM | POA: Diagnosis not present

## 2021-08-11 DIAGNOSIS — N1832 Chronic kidney disease, stage 3b: Secondary | ICD-10-CM | POA: Diagnosis not present

## 2021-08-11 DIAGNOSIS — M129 Arthropathy, unspecified: Secondary | ICD-10-CM | POA: Diagnosis not present

## 2021-08-11 DIAGNOSIS — E559 Vitamin D deficiency, unspecified: Secondary | ICD-10-CM | POA: Diagnosis not present

## 2021-08-11 DIAGNOSIS — R9389 Abnormal findings on diagnostic imaging of other specified body structures: Secondary | ICD-10-CM | POA: Diagnosis not present

## 2021-08-11 DIAGNOSIS — A6002 Herpesviral infection of other male genital organs: Secondary | ICD-10-CM | POA: Diagnosis not present

## 2021-08-11 DIAGNOSIS — I10 Essential (primary) hypertension: Secondary | ICD-10-CM | POA: Diagnosis not present

## 2021-08-11 DIAGNOSIS — Z8673 Personal history of transient ischemic attack (TIA), and cerebral infarction without residual deficits: Secondary | ICD-10-CM | POA: Diagnosis not present

## 2021-08-13 ENCOUNTER — Telehealth: Payer: Self-pay | Admitting: *Deleted

## 2021-08-13 NOTE — Telephone Encounter (Signed)
   Patient Name: [name]  DOB: 03-26-1950 MRN: [id number]  Primary Cardiologist: Werner Lean, MD  Chart reviewed as part of pre-operative protocol coverage.   We received surgical clearance request to hold Plavix prior to right total knee arthroplasty scheduled for 11/16/2018.  Patient takes Plavix for history of stroke.  Cardiology does not manage patient's Plavix, therefore, recommendations on holding Plavix prior to surgery should come from managing/prescribing provider.  Preop coverage team, please notify requesting party.  Please call with any questions.  Lenna Sciara, NP 08/13/2021, 1:16 PM

## 2021-08-13 NOTE — Telephone Encounter (Signed)
Faxed clearance back to requesting surgeon's office.

## 2021-08-13 NOTE — Telephone Encounter (Signed)
DUPLICATE CLEARANCE:  SEE PHONE NOTE 01/2021.     Pre-operative Risk Assessment    Patient Name: [name]  DOB: 1950/06/29 MRN: [id number]      Request for Surgical Clearance    Procedure:   RIGHT TOTAL KNEE ARTHROPLASTY  Date of Surgery:  Clearance 11/15/21                                 Surgeon:  DR. Gaynelle Arabian Surgeon's Group or Practice Name:  [facility] Phone number:  [phone] Fax number:  [phone]  ATTN:  [place] [facility]   Type of Clearance Requested:   - Pharmacy:  Hold Clopidogrel (Plavix) NOT INDICATED   Type of Anesthesia:   CHOICE   Additional requests/questions:    Astrid Divine   08/13/2021, 10:06 AM

## 2021-08-13 NOTE — Telephone Encounter (Signed)
Received request for refill on Belsomra.  During his last appointment this was not prescribed.  Patient would need an appointment to discuss with provider if this will be restarted.    Fatima Sanger MD Resident

## 2021-08-20 DIAGNOSIS — M180 Bilateral primary osteoarthritis of first carpometacarpal joints: Secondary | ICD-10-CM | POA: Diagnosis not present

## 2021-08-24 DIAGNOSIS — L819 Disorder of pigmentation, unspecified: Secondary | ICD-10-CM | POA: Diagnosis not present

## 2021-08-24 DIAGNOSIS — L299 Pruritus, unspecified: Secondary | ICD-10-CM | POA: Diagnosis not present

## 2021-09-03 ENCOUNTER — Other Ambulatory Visit (HOSPITAL_COMMUNITY): Payer: Self-pay | Admitting: Psychiatry

## 2021-09-03 ENCOUNTER — Other Ambulatory Visit (HOSPITAL_COMMUNITY): Payer: Self-pay | Admitting: *Deleted

## 2021-09-03 ENCOUNTER — Telehealth (HOSPITAL_COMMUNITY): Payer: Self-pay | Admitting: Psychiatry

## 2021-09-03 ENCOUNTER — Telehealth (HOSPITAL_COMMUNITY): Payer: Self-pay | Admitting: *Deleted

## 2021-09-03 DIAGNOSIS — F419 Anxiety disorder, unspecified: Secondary | ICD-10-CM

## 2021-09-03 DIAGNOSIS — F3342 Major depressive disorder, recurrent, in full remission: Secondary | ICD-10-CM

## 2021-09-03 MED ORDER — TRAZODONE HCL 150 MG PO TABS: 150.0000 mg | ORAL_TABLET | Freq: Every evening | ORAL | 3 refills | Status: DC | PRN

## 2021-09-03 MED ORDER — PAROXETINE HCL ER 37.5 MG PO TB24: 37.5000 mg | ORAL_TABLET | Freq: Every day | ORAL | 3 refills | Status: DC

## 2021-09-03 MED ORDER — BELSOMRA 10 MG PO TABS: 1.0000 | ORAL_TABLET | Freq: Every day | ORAL | 2 refills | Status: DC

## 2021-09-03 MED ORDER — QUETIAPINE FUMARATE 25 MG PO TABS: ORAL_TABLET | ORAL | 3 refills | Status: DC

## 2021-09-03 MED ORDER — BUSPIRONE HCL 15 MG PO TABS: 15.0000 mg | ORAL_TABLET | Freq: Two times a day (BID) | ORAL | 0 refills | Status: AC

## 2021-09-03 MED ORDER — BUSPIRONE HCL 15 MG PO TABS: 30.0000 mg | ORAL_TABLET | Freq: Two times a day (BID) | ORAL | 3 refills | Status: DC

## 2021-09-03 NOTE — Telephone Encounter (Signed)
VM from [facility] questioning the sig on patients Buspar and did not get a rx for his Belsomra which they were expecting. Reviewed chart as his provider has left for the day. No mention of Belsomra in her most recent note, and changed the rx for the Buspar to match what he ususally gets and when her note reflected which is 15 mg one pill twice a day. Notified pharmacy of correction on buspar and will forward request for Belsomra to Eulis Canner DNP but it will not be addressed before Monday when she returns to work.

## 2021-09-03 NOTE — Telephone Encounter (Signed)
Medication refilled and sent to preferred pharmacy

## 2021-09-03 NOTE — Telephone Encounter (Signed)
Belsomra refilled and sent to [facility]. Patient is prescribed Buspar 15 mg twice daily.

## 2021-09-06 NOTE — Telephone Encounter (Signed)
Medication was refilled last week and sent to preferred pharmacy. Provider called pharmacy to confirm.

## 2021-09-06 NOTE — Telephone Encounter (Signed)
Medications refilled and sent to preferred pharmacy. Provider called pharmacy to confirm.

## 2021-09-09 ENCOUNTER — Ambulatory Visit (HOSPITAL_COMMUNITY)
Admission: EM | Admit: 2021-09-09 | Discharge: 2021-09-09 | Disposition: A | Discharge status: Home / Self Care | Payer: Medicare Other | Attending: Psychiatry | Admitting: Psychiatry

## 2021-09-09 ENCOUNTER — Ambulatory Visit (INDEPENDENT_AMBULATORY_CARE_PROVIDER_SITE_OTHER)
Admission: EM | Admit: 2021-09-09 | Discharge: 2021-09-10 | Disposition: A | Discharge status: Home / Self Care | Payer: Medicare Other | Source: Home / Self Care

## 2021-09-09 DIAGNOSIS — G4700 Insomnia, unspecified: Secondary | ICD-10-CM | POA: Insufficient documentation

## 2021-09-09 DIAGNOSIS — R9389 Abnormal findings on diagnostic imaging of other specified body structures: Secondary | ICD-10-CM | POA: Diagnosis not present

## 2021-09-09 DIAGNOSIS — F411 Generalized anxiety disorder: Secondary | ICD-10-CM | POA: Insufficient documentation

## 2021-09-09 DIAGNOSIS — R451 Restlessness and agitation: Secondary | ICD-10-CM

## 2021-09-09 DIAGNOSIS — F419 Anxiety disorder, unspecified: Secondary | ICD-10-CM

## 2021-09-09 DIAGNOSIS — F32A Depression, unspecified: Secondary | ICD-10-CM | POA: Insufficient documentation

## 2021-09-09 DIAGNOSIS — R0602 Shortness of breath: Secondary | ICD-10-CM | POA: Diagnosis not present

## 2021-09-09 NOTE — BH Assessment (Addendum)
Comprehensive Clinical Assessment (CCA) Screening, Triage and Referral Note  09/09/2021 [name] [id number]  Chief Complaint: Pt reports increased anxiety and depression for the past couple of weeks when he started taking pain medications. Pt denies SI, HI, AVH and substance use. Note: (Patient was seen earlier today by the [facility] provider, Effie Berkshire, NP and recommended to follow up with Burt Ek at the [facility] 09/14/2021)  Visit Diagnosis: Depressive Disorder, Severe and Anxiety Disorder  Patient Reported Information How did you hear about [place]? Self  What Is the Reason for Your Visit/Call Today?  [name] is a 71 y/o male that presents to the [facility]. He is voluntary and a self-referral. History of depression and anxiety. He was diagnosed during his teens.   Currently prescribed Buspar and Paxil. He has taken both medications for many years and they have been effective for him.  The Buspar is prescribed by a provider in [place], where he was previously living. The Paxil is prescribed by Burt Ek at the [facility].  His medical provider has recently recommended that he have hand surgery and a knee replacement.   Meanwhile, patient suffers from pain related symptoms in both areas of his body. His medical provider provided him with pain medications to manage those symptoms until his surgery is conducted. He believes that the pain medications taken for the past 3 months have caused his anti-depressant and anti-anxiety to no be ineffective.    Patient has an upcoming appointment with Lynden Ang on Tuesday of next week to discuss his medication concerns. However, would like medications tonight that would help with his anxiety/depression that he feels is unmanageable.   Denies SI, HI, AVH's, and substance use. No history of inpatient psychiatric treatment. Lives in an [place].   During evaluation Fannie L Karstens is  a&ox3, in no acute distress, non-toxic appearing. He appears casually dressed, fairly groomed, appropriate for environment. He makes good eye contact. Speech is clear and coherent, w/ nml rate and volume. Reported mood is anxious, depressed. Affect is blunt.Calm and cooperative.    How Long Has This Been Causing You Problems? 1 wk - 1 month  What Do You Feel Would Help You the Most Today? Treatment for Depression or other mood problem   Have You Recently Had Any Thoughts About Hurting Yourself? No  Are You Planning to Commit Suicide/Harm Yourself At This time? No   Have you Recently Had Thoughts About [place]? No  Are You Planning to Harm Someone at This Time? No  Explanation: No data recorded  Have You Used Any Alcohol or Drugs in the Past 24 Hours? No  How Long Ago Did You Use Drugs or Alcohol? No data recorded What Did You Use and How Much? No data recorded  Do You Currently Have a Therapist/Psychiatrist? No data recorded Name of Therapist/Psychiatrist: No data recorded  Have You Been Recently Discharged From Any Office Practice or Programs? No data recorded Explanation of Discharge From Practice/Program: No data recorded    [place] of Residence: [place]  Patient Currently Receiving the Following Services: [facility]  Determination of Need: Routine (7 days)   Options For Referral: Medication Management; Outpatient Therapy   Discharge Disposition: Disposition pending the provider's MSE.     Waldon Merl, Counselor

## 2021-09-09 NOTE — ED Provider Notes (Signed)
Behavioral Health Urgent Care Medical Screening Exam  Patient Name: [name] MRN: [id number] Date of Evaluation: 09/09/21 Chief Complaint:   Diagnosis:  Final diagnoses:  Anxiety   History of Present illness: [name] is a 71 y.o. male. Pt presents voluntarily to [facility] behavioral health for walk-in assessment.  Pt is assessed face-to-face by nurse practitioner.   [name], 71 y.o., male patient seen face to face by this provider, consulted with Dr. Dwyane Dee; and chart reviewed on 09/09/21.  On evaluation [name] reports he is presenting today because he is experiencing increase in anxiety and depression. He states he has been experiencing increased anxiety since this morning and increased depression for the past week. He states that he took buspar which he is prescribed and it is helping with the anxiety. When asked about any recent stressors, pt reports he did have an argument with his girlfriend, although does not feel that this argument is related to increased depression and anxiety. Pt feels that his increased depression and anxiety is related to having started hydrocodone for pain management. PDMP reviewed and pt was written hydrocodone on 08/20/21 and 08/26/21. He states he is prescribed hydrocodone for pain related to his "lack of cartilage" and he has been taking it for the past 1 to 2 weeks. Pt states he has not talked to his prescribing provider about his concerns. Pt would like to be switched to another pain management medication. Discussed w/ pt recommendation for reaching out to his prescribing provider about his concerns and discussing further options for pain management. Pt agrees w/ recommendation.  Pt reports he is eating 1 meal/day in an effort to lose weight. He reports good sleep, sleeping at a minimum of 8 hours.   Pt denies suicidal ideations, homicidal ideations, or violent ideations. He easily verbally contracts to safety for himself and others.  He denies auditory visual hallucinations or paranoia.  Pt denies hx of suicide attempt or non-suicidal self injurious behavior. He reports 3 or 4 inpatient psychiatric hospitalizations related to depression and suicidal ideation.   Pt denies alcohol, marijuana, nicotine, other substance use.   Pt denies knowledge of family psychiatric hx.   Pt reports he is receiving medication management from [facility] which is confirmed by chart review. Pt has an appointment w/ Burt Ek on 09/30/21. He states he would like an earlier appointment if possible. He states he is receiving counseling from [facility] and sees her every 2 weeks.   Pt reports he is living in an independent living facility. He works part-time at [employer]. He reports highest level of education is masters in education.  Pt denies access to a firearm or other weapon.  Pt gave verbal consent for writer to reach out to [facility] to set up earlier appointment. Writer made appointment for pt for 09/14/21 at 3PM for an in-person visit with Burt Ek as per pt request and w/ pt verbal consent. This was reviewed w/ tht pt who agrees to attend the appointment.  During evaluation [name] is a&ox3, in no acute distress, non-toxic appearing. He appears casually dressed, fairly groomed, appropriate for environment. He makes good eye contact. Speech is clear and coherent, w/ nml rate and volume. Reported mood is anxious, depressed. Affect is blunt. TP is coherent. Description of associations is intact. TC is logical. There is no evidence of responding to internal stimuli, agitation, aggression, or distractibility. Pt is calm, cooperative, pleasant. He answers questions appropriately.  Psychiatric  Specialty Exam  Presentation  General Appearance:Appropriate for Environment; Casual; Fairly Groomed  Eye Contact:Good  Speech:Clear and Coherent; Normal Rate  Speech Volume:Normal  Handedness:No data recorded  Mood and  Affect  Mood:Anxious; Depressed Affect:Blunt  Thought Process  Thought Processes:Coherent Descriptions of Associations:Intact  Orientation:Full (Time, Place and Person)  Thought Content:Logical    Hallucinations:None  Ideas of Reference:None  Suicidal Thoughts:No  Homicidal Thoughts:No   Sensorium  Memory:Immediate Fair Judgment:Fair Insight:Fair  Executive Functions  Concentration:Fair Attention Span:Fair [place]  Psychomotor Activity  Psychomotor Activity:Restlessness  Assets  Assets:Communication Skills; Desire for Improvement; Financial Resources/Insurance; Housing; Social Support  Sleep  Sleep:Fair Number of hours: No data recorded  No data recorded  Physical Exam: Physical Exam Cardiovascular:     Rate and Rhythm: Bradycardia present.  Pulmonary:     Effort: Pulmonary effort is normal.  Neurological:     Mental Status: He is alert and oriented to person, place, and time.  Psychiatric:        Attention and Perception: Attention and perception normal.        Mood and Affect: Mood is anxious and depressed. Affect is blunt.        Speech: Speech normal.        Behavior: Behavior normal. Behavior is cooperative.        Thought Content: Thought content normal.    Review of Systems  Constitutional:  Negative for chills and fever.  Respiratory:  Negative for shortness of breath.   Cardiovascular:  Negative for chest pain and palpitations.  Gastrointestinal:  Negative for abdominal pain.  Neurological:  Negative for dizziness and headaches.  Psychiatric/Behavioral:  Positive for depression. The patient is nervous/anxious.    Blood pressure (!) 130/90, pulse (!) 56, temperature 98 F (36.7 C), resp. rate 18, SpO2 100 %. There is no height or weight on file to calculate BMI.  Musculoskeletal: Strength & Muscle Tone: within normal limits Gait & Station: normal Patient leans: N/A  [place] MSE Discharge  Disposition for Follow up and Recommendations: Based on my evaluation the patient does not appear to have an emergency medical condition and can be discharged with resources and follow up care in outpatient services  Patient is instructed prior to discharge to: Take all medications as prescribed by his/her mental healthcare provider. Report any adverse effects and or reactions from the medicines to his/her outpatient provider promptly. Keep all scheduled appointments, to ensure that you are getting refills on time and to avoid any interruption in your medication. If you are unable to keep an appointment call to reschedule.  Be sure to follow-up with resources and follow-up appointments provided.  Patient has been instructed & cautioned: To not engage in alcohol and or illegal drug use while on prescription medicines. In the event of worsening symptoms, patient is instructed to call the crisis hotline, 911 and or go to the nearest ED for appropriate evaluation and treatment of symptoms. To follow-up with his/her primary care provider for your other medical issues, concerns and or health care needs.   Tharon Aquas, NP 09/09/2021, 3:23 PM

## 2021-09-09 NOTE — ED Notes (Signed)
Patient discharged by provider with written and verbal instructions.

## 2021-09-09 NOTE — Discharge Instructions (Addendum)
You are scheduled for an in-person appointment with [name] for Tuesday, August 29, at [facility]. Please go to the 2nd floor of this building for your appointment.  Patient is instructed prior to discharge to: Take all medications as prescribed by his/her mental healthcare provider. Report any adverse effects and or reactions from the medicines to his/her outpatient provider promptly. Keep all scheduled appointments, to ensure that you are getting refills on time and to avoid any interruption in your medication.  If you are unable to keep an appointment call to reschedule.  Be sure to follow-up with resources and follow-up appointments provided.  Patient has been instructed & cautioned: To not engage in alcohol and or illegal drug use while on prescription medicines. In the event of worsening symptoms, patient is instructed to call the crisis hotline, 911 and or go to the nearest ED for appropriate evaluation and treatment of symptoms. To follow-up with his/her primary care provider for your other medical issues, concerns and or health care needs.

## 2021-09-09 NOTE — ED Provider Notes (Signed)
Behavioral Health Urgent Care Medical Screening Exam  Patient Name: KATRINA BROSH MRN: [id number] Date of Evaluation: 09/09/21 Chief Complaint:  " I feel restless" Diagnosis:  Final diagnoses:  Anxiety state    History of Present illness: AMAREON PHUNG is a 71 y.o. male.  With psychiatric history of MDD and anxiety presenting voluntarily to the [facility] as a walk-in with complaints of restlessness and insomnia tonight.  Per chart review, the patient was seen earlier today at this facility on an outpatient basis after he presented reporting increased anxiety and depression.  Patient currently has an [facility]out[facility] appointment on 09/14/2021 for an in person visit.    However patient returned tonight, with complaints of being concerned about needing knee replacement and surgery on his left hand.  Patient reports that due to lack of cartilage in his joints, he hurts badly and was prescribed hydrocodone for pain management a while back.  Patient reports that since he started taking this medication, his anxiety has increased, and he is currently restless, and unable to sleep tonight.   Patient reports that he took all his bedtime medications, and is currently feeling sleepy right now.  Patient reports he currently takes a few of his prescribed medications, as he feels like they do not work as prescribed.  Patient reports he has a lot of prescribed medications and is unable to remember all the names at this time Patient reports his sleep as "ok", and appetite as good.  Patient denies suicidal ideations, denies homicidal ideations, denies audio/visual hallucinations.  Patient reports he lives at an independent living facility, and states that he wants to get out of there because he is not 71 years old and the place is not age-appropriate and he only moved there because his daughter encouraged him to when he was depressed.  Patient denies access to a gun or weapons.  Patient further stated that he  is looking to establish psychiatric care and medication management with a different outpatient psychiatrist, as he currently sees a nurse practitioner at the [facility] visit upstairs.  Patient reports that since he moved from [place] to [place], he has not been able to find a reliable psychiatrist to manage his pain.  Patient reports that he has been to several outpatient psychiatric clinics in the area, but no one has been able to help him manage his pain.  Patient reports that he has stopped taking the hydrocodone, and he feels like in a few days' time the medication will wear off and he will feel better again.   Support and encouragement and reassurance provided about ongoing stressors.  Patient provided with opportunity for questions.  On evaluation, patient is alert, oriented x 4, and cooperative. Speech is clear, coherent and logical. Pt appears well groomed. Eye contact is good. Mood is anxious, affect is congruent with mood. Thought process is logical and thought content is coherent. Pt denies SI/HI/AVH. There is no indication that the patient is responding to internal stimuli. No delusions elicited during this assessment.    Psychiatric Specialty Exam  Presentation  General Appearance:Fairly Groomed  Eye Contact:Good  Speech:Clear and Coherent  Speech Volume:Normal  Handedness:No data recorded  Mood and Affect  Mood:Anxious  Affect:Congruent   Thought Process  Thought Processes:Coherent  Descriptions of Associations:Intact  Orientation:Full (Time, Place and Person)  Thought Content:WDL    Hallucinations:None  Ideas of Reference:None  Suicidal Thoughts:No  Homicidal Thoughts:No   Sensorium  Memory:Immediate Fair; Recent Fair  Judgment:Fair  Insight:Fair  Executive Functions  Concentration:Fair  Attention Span:Fair  [place]   Psychomotor Activity  Psychomotor  Activity:Restlessness   Assets  Assets:Communication Skills; Desire for Improvement; Social Support   Sleep  Sleep:Fair  Number of hours: No data recorded  Nutritional Assessment (For OBS and FBC admissions only) Has the patient had a weight loss or gain of 10 pounds or more in the last 3 months?: No Has the patient had a decrease in food intake/or appetite?: No Does the patient have dental problems?: No Does the patient have eating habits or behaviors that may be indicators of an eating disorder including binging or inducing vomiting?: No Has the patient recently lost weight without trying?: 0 Has the patient been eating poorly because of a decreased appetite?: 0 Malnutrition Screening Tool Score: 0    Physical Exam: Physical Exam Constitutional:      General: He is not in acute distress.    Appearance: He is not diaphoretic.  HENT:     Head: Normocephalic.     Right Ear: External ear normal.     Left Ear: External ear normal.     Nose: No congestion.  Eyes:     General:        Right eye: No discharge.        Left eye: No discharge.  Cardiovascular:     Rate and Rhythm: Normal rate.  Pulmonary:     Effort: No respiratory distress.  Chest:     Chest wall: No tenderness.  Neurological:     Mental Status: He is alert and oriented to person, place, and time.  Psychiatric:        Attention and Perception: Attention and perception normal.        Mood and Affect: Affect normal. Mood is anxious.        Speech: Speech normal.        Behavior: Behavior is cooperative.        Thought Content: Thought content normal. Thought content is not paranoid or delusional. Thought content does not include homicidal or suicidal ideation. Thought content does not include homicidal or suicidal plan.        Cognition and Memory: Cognition and memory normal.        Judgment: Judgment normal.    Review of Systems  Constitutional:  Negative for chills, diaphoresis and fever.  HENT:   Negative for congestion.   Eyes:  Negative for pain and discharge.  Respiratory:  Negative for cough, shortness of breath and wheezing.   Cardiovascular:  Negative for chest pain and palpitations.  Gastrointestinal:  Negative for diarrhea, nausea and vomiting.  Neurological:  Negative for seizures, loss of consciousness, weakness and headaches.  Psychiatric/Behavioral:  Negative for depression, hallucinations, substance abuse and suicidal ideas. The patient is nervous/anxious and has insomnia.    Blood pressure 101/67, pulse 70, temperature 98.1 F (36.7 C), resp. rate 18, SpO2 98 %. There is no height or weight on file to calculate BMI.  Musculoskeletal: Strength & Muscle Tone: within normal limits Gait & Station: normal Patient leans: N/A   [place] MSE Discharge Disposition for Follow up and Recommendations: Based on my evaluation the patient does not appear to have an emergency medical condition and can be discharged with resources and follow up care in outpatient services for Medication Management and Individual Therapy  Recommend discharge to home, and follow-up with outpatient psychiatric services as scheduled .  Discussed taking all medications as prescribed. Patient does not  meet inpatient admission criteria, or IVC criteria and can be safely discharged to home.  There is no evidence of imminent risk of harm to self or others at this time.  Discussed crisis plan, calling 911, or going to the ED if condition worsens.  Patient verbalizes his understanding.  Please refrain from using alcohol or illicit substances, as they can affect your mood and can cause depression, anxiety or other concerning symptoms.  Alcohol can increase the chance that a person will make reckless decisions, like attempting suicide, and can increase the lethality of a drug overdose.    Disp-Pt discharged to home, condition at discharge is stable.  Randon Goldsmith, NP 09/09/2021, 11:44 PM

## 2021-09-09 NOTE — BH Assessment (Signed)
Pt reports increased anxiety and depression for the past couple of weeks when he started taking pain medications. Pt denies SI, HI, AVH and substance use.

## 2021-09-10 ENCOUNTER — Ambulatory Visit (HOSPITAL_BASED_OUTPATIENT_CLINIC_OR_DEPARTMENT_OTHER): Payer: Medicare Other | Attending: Physical Therapy | Admitting: Physical Therapy

## 2021-09-10 ENCOUNTER — Encounter (HOSPITAL_BASED_OUTPATIENT_CLINIC_OR_DEPARTMENT_OTHER): Payer: Self-pay

## 2021-09-10 NOTE — Discharge Instructions (Addendum)
  Discharge recommendations:  Patient is to take medications as prescribed. Please see information for follow-up appointment with psychiatry and therapy. Please follow up with your primary care provider for all medical related needs.   Therapy: We recommend that patient participate in individual therapy to address mental health concerns.  Medications: The parent/guardian is to contact a medical professional and/or outpatient provider to address any new side effects that develop. Parent/guardian should update outpatient providers of any new medications and/or medication changes.   Atypical antipsychotics: If you are prescribed an atypical antipsychotic, it is recommended that your height, weight, BMI, blood pressure, fasting lipid panel, and fasting blood sugar be monitored by your outpatient providers.  Safety:  The patient should abstain from use of illicit substances/drugs and abuse of any medications. If symptoms worsen or do not continue to improve or if the patient becomes actively suicidal or homicidal then it is recommended that the patient return to the closest hospital emergency department, the [facility], or call 911 for further evaluation and treatment. National Suicide Prevention Lifeline 1-800-SUICIDE or 902-575-8428.  About 988 988 offers 24/7 access to trained crisis counselors who can help people experiencing mental health-related distress. People can call or text 988 or chat 988lifeline.org for themselves or if they are worried about a loved one who may need crisis support.  Crisis Mobile: [facility]:                     505-459-5329 (for crisis response 24 hours a day) [place]:                                            (418)761-7021

## 2021-09-10 NOTE — ED Notes (Signed)
Discharge instructions and local psychiatric resources discussed with [name] and all questions entertained prior to his departure.

## 2021-09-13 DIAGNOSIS — F331 Major depressive disorder, recurrent, moderate: Secondary | ICD-10-CM | POA: Diagnosis not present

## 2021-09-13 DIAGNOSIS — F411 Generalized anxiety disorder: Secondary | ICD-10-CM | POA: Diagnosis not present

## 2021-09-14 ENCOUNTER — Ambulatory Visit (HOSPITAL_COMMUNITY): Payer: Medicare Other | Admitting: Psychiatry

## 2021-09-14 DIAGNOSIS — F332 Major depressive disorder, recurrent severe without psychotic features: Secondary | ICD-10-CM | POA: Diagnosis not present

## 2021-09-21 DIAGNOSIS — F411 Generalized anxiety disorder: Secondary | ICD-10-CM | POA: Diagnosis not present

## 2021-09-21 DIAGNOSIS — F331 Major depressive disorder, recurrent, moderate: Secondary | ICD-10-CM | POA: Diagnosis not present

## 2021-09-23 DIAGNOSIS — F332 Major depressive disorder, recurrent severe without psychotic features: Secondary | ICD-10-CM | POA: Diagnosis not present

## 2021-09-24 ENCOUNTER — Ambulatory Visit: Payer: Medicare Other | Attending: Internal Medicine | Admitting: Internal Medicine

## 2021-09-24 ENCOUNTER — Encounter: Payer: Self-pay | Admitting: Internal Medicine

## 2021-09-24 VITALS — BP 110/50 | HR 58 | Ht 67.0 in | Wt 187.2 lb

## 2021-09-24 DIAGNOSIS — I10 Essential (primary) hypertension: Secondary | ICD-10-CM | POA: Diagnosis not present

## 2021-09-24 DIAGNOSIS — I5032 Chronic diastolic (congestive) heart failure: Secondary | ICD-10-CM | POA: Insufficient documentation

## 2021-09-24 DIAGNOSIS — E782 Mixed hyperlipidemia: Secondary | ICD-10-CM | POA: Insufficient documentation

## 2021-09-24 MED ORDER — METOPROLOL SUCCINATE ER 25 MG PO TB24: 25.0000 mg | ORAL_TABLET | Freq: Every day | ORAL | 3 refills | Status: DC

## 2021-09-24 NOTE — Progress Notes (Signed)
Cardiology Office Note:    Date:  09/24/2021   ID:  [name], DOB 28-Dec-1950, MRN [id number]  PCP:  Kristie Cowman, MD  [facility] Cardiologist:  Werner Lean, MD  [place] Electrophysiologist:  None   CC: post surgery f/u  History of Present Illness:    [name] is a 71 y.o. male with a hx of HTN, HLD, Stroke NOS, MCD NOS, established in 2021.  2022 Started on spironolactone & lasix 40 mg PO; then started on amlodipine and hydralazine with [facility].  Had Ibuprofen use leading to AKI. 2023: asymptomatic and was pre-oped for surgery.  Called back in with new CP.X1 (at pre-op).  Has normal stress test. Is going for surgery October 30th.  Patient notes that he is doing well.   Since last visit notes no more chest pain. There was a miscommunication on 05/24/21: they thought his daughter would not be able to help. Has some arthritis.    There are no interval hospital/ED visit.    No chest pain or pressure .  No SOB/DOE and no PND/Orthopnea.  No weight gain or leg swelling.  No palpitations or syncope . Has had new low blood pressure and feels a bit more tired and fatigued.  Past Medical History:  Diagnosis Date   (HFpEF) heart failure with preserved ejection fraction (HCC)    Depression    Frequent urination    GERD (gastroesophageal reflux disease)    Headache    mirgaines in the past   Herpes    History of nuclear stress test    Myoview 05/2021: EF 77, normal perfusion, low risk   Hyperlipidemia    Hypertension    Memory loss    Obesity    Stroke [facility])    Urgency of urination     Past Surgical History:  Procedure Laterality Date   CHOLECYSTECTOMY N/A 12/17/2019   Procedure: LAPAROSCOPIC CHOLECYSTECTOMY WITH ICG INJECTION;  Surgeon: Rolm Bookbinder, MD;  Location: [place];  Service: General;  Laterality: N/A;   disckec     SPINAL FUSION     TONSILLECTOMY     VASECTOMY     no per pt   WISDOM TOOTH EXTRACTION      Current  Medications: Current Meds  Medication Sig   amLODipine (NORVASC) 5 MG tablet Take 1 tablet (5 mg total) by mouth daily.   busPIRone (BUSPAR) 15 MG tablet Take 1 tablet (15 mg total) by mouth 2 (two) times daily.   candesartan (ATACAND) 32 MG tablet Take 32 mg by mouth daily.   clopidogrel (PLAVIX) 75 MG tablet Take 1 tablet (75 mg total) by mouth daily.   furosemide (LASIX) 40 MG tablet Take 1 tablet (40 mg total) by mouth 2 (two) times daily.   gabapentin (NEURONTIN) 400 MG capsule Take 400 mg by mouth 2 (two) times daily.   hydrALAZINE (APRESOLINE) 50 MG tablet Take 1 tablet (50 mg total) by mouth 2 (two) times daily.   metoprolol succinate (TOPROL XL) 25 MG 24 hr tablet Take 1 tablet (25 mg total) by mouth daily.   PARoxetine (PAXIL-CR) 37.5 MG 24 hr tablet Take 1 tablet (37.5 mg total) by mouth at bedtime.   primidone (MYSOLINE) 250 MG tablet TAKE 1 TABLET BY MOUTH TWICE A DAY   QUEtiapine (SEROQUEL) 25 MG tablet TAKE 3 TABLETS BY MOUTH AT BEDTIME   rosuvastatin (CRESTOR) 20 MG tablet Take 20 mg by mouth daily.   sildenafil (VIAGRA) 50 MG tablet Take  100 mg by mouth as needed for erectile dysfunction.   spironolactone (ALDACTONE) 25 MG tablet Take 25 mg by mouth daily.   Suvorexant (BELSOMRA) 10 MG TABS Take 1 tablet by mouth at bedtime.   traZODone (DESYREL) 150 MG tablet Take 1 tablet (150 mg total) by mouth at bedtime as needed. for sleep   valACYclovir (VALTREX) 1000 MG tablet Take 1,000 mg by mouth daily.   Vitamin D, Ergocalciferol, (DRISDOL) 1.25 MG (50000 UNIT) CAPS capsule Take 50,000 Units by mouth once a week.   [DISCONTINUED] metoprolol succinate (TOPROL-XL) 50 MG 24 hr tablet Take 50 mg by mouth daily.     Allergies:   Aspirin, Lisinopril, Lithium, Oxycodone, Oxycodone hcl, Oxycodone-acetaminophen, and Pork-derived products   Social History   Socioeconomic History   Marital status: Divorced    Spouse name: Not on file   Number of children: 1   Years of education:  Masters   Highest education level: Not on file  Occupational History   Occupation: retired    Fish farm manager: RETIRED  Tobacco Use   Smoking status: Former   Smokeless tobacco: Never  Scientific laboratory technician Use: Never used  Substance and Sexual Activity   Alcohol use: No    Alcohol/week: 1.0 standard drink of alcohol    Types: 1 Cans of beer per week    Comment: socially   Drug use: No   Sexual activity: Never  Other Topics Concern   Not on file  Social History Narrative   Patient lives in [place] living @ [facility]   Patient is right-handed.   Patient does not drink any caffeine.   Social Determinants of Health   Financial Resource Strain: Not on file  Food Insecurity: Not on file  Transportation Needs: Not on file  Physical Activity: Not on file  Stress: Not on file  Social Connections: Not on file    Social: works at golf course and plays golf, broke up with girlfriend Lives in an independent living facilty.  Family History: The patient's family history includes Cancer in his maternal aunt and maternal aunt; Cancer - Ovarian in his maternal grandmother; Diabetes Mellitus I in his paternal grandmother; Hypertension in his brother, father, mother, paternal grandfather, and sister. History of coronary artery disease notable for no members. History of heart failure notable for no members. No history of cardiomyopathies including hypertrophic cardiomyopathy, left ventricular non-compaction, or arrhythmogenic right ventricular cardiomyopathy. History of arrhythmia notable for no members. Denies family history of sudden cardiac death including drowning, car accidents, or unexplained deaths in the family. No history of bicuspid aortic valve or aortic aneurysm or dissection.  ROS:   Please see the history of present illness.     All other systems reviewed and are negative.  EKGs/Labs/Other Studies Reviewed:    The following studies were reviewed today:  EKG:   04/09/21  Sinus bradycardia rate 58 RBBB 01/24/08 SR 82 WNL 12/16/19:  SR rate 65, iRBBB and LAFB  Transthoracic Echocardiogram: Date: 01/02/20 Results: Severe LA dilation; IVC WNL E/E' 20 RVSP 22 1. Left ventricular ejection fraction, by estimation, is 60 to 65%. The  left ventricle has normal function. The left ventricle has no regional  wall motion abnormalities. There is mild concentric left ventricular  hypertrophy. Left ventricular diastolic  parameters are consistent with Grade II diastolic dysfunction  (pseudonormalization). Elevated left atrial pressure.   2. Right ventricular systolic function is normal. The right ventricular  size is mildly enlarged. There is normal pulmonary artery  systolic  pressure.   3. Left atrial size was severely dilated.   4. Right atrial size was moderately dilated.   5. The mitral valve is normal in structure. No evidence of mitral valve  regurgitation. No evidence of mitral stenosis.   6. The aortic valve is normal in structure. Aortic valve regurgitation is  not visualized. Mild aortic valve sclerosis is present, with no evidence  of aortic valve stenosis.   7. The inferior vena cava is normal in size with greater than 50%  respiratory variability, suggesting right atrial pressure of 3 mmHg.  GATED SPECT MYO PERF W/LEXISCAN STRESS 1D 05/20/2021  Narrative   The study is normal. The study is low risk.   No ST deviation was noted.   LV perfusion is normal.   Left ventricular function is normal. Nuclear stress EF: 77 %. The left ventricular ejection fraction is hyperdynamic (>65%). End diastolic cavity size is normal. End systolic cavity size is normal.   Prior study not available for comparison.  Normal perfusion. LVEF 77% with normal wall motion. This is a low risk study. No prior for comparison.  Recent Labs: 05/13/2021: BUN 17; Creatinine, Ser 1.49; Hemoglobin 12.3; Platelets 309; Potassium 4.5; Sodium 137  Recent Lipid Panel    Component Value  Date/Time   CHOL 140 06/04/2020 1320   TRIG 106 06/04/2020 1320   HDL 35 (L) 06/04/2020 1320   CHOLHDL 4.0 06/04/2020 1320   CHOLHDL 5.7 12/17/2019 0241   VLDL 36 12/17/2019 0241   LDLCALC 85 06/04/2020 1320    Physical Exam:    VS:  BP (!) 110/50   Pulse (!) 58   Ht '5\' 7"'$  (1.702 m)   Wt 187 lb 3.2 oz (84.9 kg)   SpO2 98%   BMI 29.32 kg/m     Wt Readings from Last 3 Encounters:  09/24/21 187 lb 3.2 oz (84.9 kg)  05/20/21 200 lb (90.7 kg)  05/19/21 200 lb 3.2 oz (90.8 kg)    Gen: No distress  Neck: No JVD Cardiac: No Rubs or Gallops, no Murmur, regular bradycardia, +2 radial pulses Respiratory: Clear to auscultation bilaterally, normal effort, normal  respiratory rate GI: Soft, nontender, non-distended  MS: Non putting edema;  moves all extremities Integument: Skin There is darkening skins in the neck consistent with acanthosis nigricans (A1c 5.9); there is darkening over the left ankle that is dark gray and raised Neuro:  At time of evaluation, alert and oriented to person/place/time/situation  Psych: Normal affect, patient feels    ASSESSMENT:    1. Essential hypertension   2. Chronic heart failure with preserved ejection fraction ([place])   3. Mixed hyperlipidemia     PLAN:    Pre-op risk evaluation - low risk given normal stress test, reasonable to proceed with surgery  - plavix can be held for procedure (no cardiac indication, sees Dr. [name] in f/u in 05/2022) - send copy of our note to Dr. Wynelle Link  Heart Failure Preserved Ejection Fraction HTN - NYHA class I, Stage B, euvolemic, etiology from HTN - Diuretic regimen: Lasix 40 mg PO BID - re-discussed compression stockings - Hydralazine 50 mg PO BID, Amlodipine 5 mg (greater than 5 worsening leg swelling) - candesartan 32 mg PO daily; has prior lisinopril allergy - in interval his metoprolol succinate has been increase to 50 mg with lower blood pressure, and fatigue; will decrease back to 25 mg Po daily  -  Aldactone 25 mg PO  - had completed eval with HTN  clinic completed in 2022   Hyperlipidemia -Continue rosuvastatin 20 mg PO daily  Six months APP One year with me    Medication Adjustments/Labs and Tests Ordered: Current medicines are reviewed at length with the patient today.  Concerns regarding medicines are outlined above.  No orders of the defined types were placed in this encounter.   Meds ordered this encounter  Medications   metoprolol succinate (TOPROL XL) 25 MG 24 hr tablet    Sig: Take 1 tablet (25 mg total) by mouth daily.    Dispense:  90 tablet    Refill:  3      Patient Instructions  Medication Instructions:  Your physician has recommended you make the following change in your medication:  DECREASE: metoprolol succinate (Toprol- XL) to 25 mg by mouth once daily Please call our office if your BP starts to increase  *If you need a refill on your cardiac medications before your next appointment, please call your pharmacy*   Lab Work: NONE If you have labs (blood work) drawn today and your tests are completely normal, you will receive your results only by: [place] (if you have MyChart) OR A paper copy in the mail If you have any lab test that is abnormal or we need to change your treatment, we will call you to review the results.   Testing/Procedures: NONE   Follow-Up: At [facility], you and your health needs are our priority.  As part of our continuing mission to provide you with exceptional heart care, we have created designated Provider Care Teams.  These Care Teams include your primary Cardiologist (physician) and Advanced Practice Providers (APPs -  Physician Assistants and Nurse Practitioners) who all work together to provide you with the care you need, when you need it.    Your next appointment:   6 month(s)  The format for your next appointment:   In Person  Provider:   Melina Copa, PA-C, Ermalinda Barrios, PA-C, or Christen Bame, NP     Then, Werner Lean, MD will plan to see you again in 1 year(s).     Important Information About Sugar         Signed, Werner Lean, MD  09/24/2021 9:32 AM    [facility]

## 2021-09-24 NOTE — Patient Instructions (Signed)
Medication Instructions:  Your physician has recommended you make the following change in your medication:  DECREASE: metoprolol succinate (Toprol- XL) to 25 mg by mouth once daily Please call our office if your BP starts to increase  *If you need a refill on your cardiac medications before your next appointment, please call your pharmacy*   Lab Work: NONE If you have labs (blood work) drawn today and your tests are completely normal, you will receive your results only by: [place] (if you have MyChart) OR A paper copy in the mail If you have any lab test that is abnormal or we need to change your treatment, we will call you to review the results.   Testing/Procedures: NONE   Follow-Up: At [facility], you and your health needs are our priority.  As part of our continuing mission to provide you with exceptional heart care, we have created designated Provider Care Teams.  These Care Teams include your primary Cardiologist (physician) and Advanced Practice Providers (APPs -  Physician Assistants and Nurse Practitioners) who all work together to provide you with the care you need, when you need it.    Your next appointment:   6 month(s)  The format for your next appointment:   In Person  Provider:   Melina Copa, PA-C, Ermalinda Barrios, PA-C, or Christen Bame, NP     Then, Werner Lean, MD will plan to see you again in 1 year(s).     Important Information About Sugar

## 2021-09-29 DIAGNOSIS — M25561 Pain in right knee: Secondary | ICD-10-CM | POA: Diagnosis not present

## 2021-09-29 DIAGNOSIS — R2689 Other abnormalities of gait and mobility: Secondary | ICD-10-CM | POA: Diagnosis not present

## 2021-09-29 DIAGNOSIS — M6281 Muscle weakness (generalized): Secondary | ICD-10-CM | POA: Diagnosis not present

## 2021-09-30 ENCOUNTER — Ambulatory Visit (HOSPITAL_COMMUNITY): Payer: Medicare Other | Admitting: Psychiatry

## 2021-09-30 DIAGNOSIS — F332 Major depressive disorder, recurrent severe without psychotic features: Secondary | ICD-10-CM | POA: Diagnosis not present

## 2021-10-01 DIAGNOSIS — R2689 Other abnormalities of gait and mobility: Secondary | ICD-10-CM | POA: Diagnosis not present

## 2021-10-01 DIAGNOSIS — M6281 Muscle weakness (generalized): Secondary | ICD-10-CM | POA: Diagnosis not present

## 2021-10-01 DIAGNOSIS — M25561 Pain in right knee: Secondary | ICD-10-CM | POA: Diagnosis not present

## 2021-10-05 DIAGNOSIS — M79641 Pain in right hand: Secondary | ICD-10-CM | POA: Diagnosis not present

## 2021-10-05 DIAGNOSIS — F331 Major depressive disorder, recurrent, moderate: Secondary | ICD-10-CM | POA: Diagnosis not present

## 2021-10-05 DIAGNOSIS — E7800 Pure hypercholesterolemia, unspecified: Secondary | ICD-10-CM | POA: Diagnosis not present

## 2021-10-05 DIAGNOSIS — A6002 Herpesviral infection of other male genital organs: Secondary | ICD-10-CM | POA: Diagnosis not present

## 2021-10-05 DIAGNOSIS — F411 Generalized anxiety disorder: Secondary | ICD-10-CM | POA: Diagnosis not present

## 2021-10-05 DIAGNOSIS — Z113 Encounter for screening for infections with a predominantly sexual mode of transmission: Secondary | ICD-10-CM | POA: Diagnosis not present

## 2021-10-05 DIAGNOSIS — N1832 Chronic kidney disease, stage 3b: Secondary | ICD-10-CM | POA: Diagnosis not present

## 2021-10-05 DIAGNOSIS — N529 Male erectile dysfunction, unspecified: Secondary | ICD-10-CM | POA: Diagnosis not present

## 2021-10-05 DIAGNOSIS — Z125 Encounter for screening for malignant neoplasm of prostate: Secondary | ICD-10-CM | POA: Diagnosis not present

## 2021-10-05 DIAGNOSIS — Z6831 Body mass index (BMI) 31.0-31.9, adult: Secondary | ICD-10-CM | POA: Diagnosis not present

## 2021-10-05 DIAGNOSIS — R5383 Other fatigue: Secondary | ICD-10-CM | POA: Diagnosis not present

## 2021-10-05 DIAGNOSIS — R0602 Shortness of breath: Secondary | ICD-10-CM | POA: Diagnosis not present

## 2021-10-05 DIAGNOSIS — I10 Essential (primary) hypertension: Secondary | ICD-10-CM | POA: Diagnosis not present

## 2021-10-05 DIAGNOSIS — Z0000 Encounter for general adult medical examination without abnormal findings: Secondary | ICD-10-CM | POA: Diagnosis not present

## 2021-10-05 DIAGNOSIS — E559 Vitamin D deficiency, unspecified: Secondary | ICD-10-CM | POA: Diagnosis not present

## 2021-10-05 DIAGNOSIS — R7303 Prediabetes: Secondary | ICD-10-CM | POA: Diagnosis not present

## 2021-10-06 DIAGNOSIS — M25561 Pain in right knee: Secondary | ICD-10-CM | POA: Diagnosis not present

## 2021-10-06 DIAGNOSIS — R2689 Other abnormalities of gait and mobility: Secondary | ICD-10-CM | POA: Diagnosis not present

## 2021-10-06 DIAGNOSIS — M6281 Muscle weakness (generalized): Secondary | ICD-10-CM | POA: Diagnosis not present

## 2021-10-08 ENCOUNTER — Emergency Department (HOSPITAL_BASED_OUTPATIENT_CLINIC_OR_DEPARTMENT_OTHER)
Admission: EM | Admit: 2021-10-08 | Discharge: 2021-10-08 | Disposition: A | Discharge status: Home / Self Care | Payer: Medicare Other | Attending: Emergency Medicine | Admitting: Emergency Medicine

## 2021-10-08 ENCOUNTER — Other Ambulatory Visit: Payer: Self-pay

## 2021-10-08 ENCOUNTER — Encounter (HOSPITAL_BASED_OUTPATIENT_CLINIC_OR_DEPARTMENT_OTHER): Payer: Self-pay | Admitting: Emergency Medicine

## 2021-10-08 DIAGNOSIS — M25561 Pain in right knee: Secondary | ICD-10-CM | POA: Diagnosis not present

## 2021-10-08 DIAGNOSIS — W010XXA Fall on same level from slipping, tripping and stumbling without subsequent striking against object, initial encounter: Secondary | ICD-10-CM | POA: Diagnosis not present

## 2021-10-08 DIAGNOSIS — Y9301 Activity, walking, marching and hiking: Secondary | ICD-10-CM | POA: Insufficient documentation

## 2021-10-08 DIAGNOSIS — W19XXXA Unspecified fall, initial encounter: Secondary | ICD-10-CM

## 2021-10-08 DIAGNOSIS — R1012 Left upper quadrant pain: Secondary | ICD-10-CM | POA: Diagnosis not present

## 2021-10-08 DIAGNOSIS — S301XXA Contusion of abdominal wall, initial encounter: Secondary | ICD-10-CM | POA: Diagnosis not present

## 2021-10-08 DIAGNOSIS — R2689 Other abnormalities of gait and mobility: Secondary | ICD-10-CM | POA: Diagnosis not present

## 2021-10-08 DIAGNOSIS — S3991XA Unspecified injury of abdomen, initial encounter: Secondary | ICD-10-CM | POA: Diagnosis present

## 2021-10-08 DIAGNOSIS — M6281 Muscle weakness (generalized): Secondary | ICD-10-CM | POA: Diagnosis not present

## 2021-10-08 NOTE — ED Notes (Signed)
Patient verbalizes understanding of discharge instructions. Opportunity for questioning and answers were provided. Patient discharged from ED.  °

## 2021-10-08 NOTE — Discharge Instructions (Addendum)
Patient was seen in the emergency department today for 10/08/2021 for evaluation of a left upper quadrant bruise after a fall earlier this week.  He is well-appearing and is cleared to return to physical therapy for ongoing care and management.

## 2021-10-08 NOTE — ED Triage Notes (Signed)
Pt arrives to ED with c/o fall. He reports he slipped and fell landing on the back of a golf cart. He reports abdominal pain.

## 2021-10-08 NOTE — ED Provider Notes (Signed)
[place] EMERGENCY DEPT Provider Note   CSN: [id number] Arrival date & time: 10/08/21  1551     History Chief Complaint  Patient presents with   Fall    HPI [name] is a 71 y.o. male presenting for fall.  Patient states that on Monday he was walking at a golf course when his feet got caught between 2 golf carts and he tripped fall hitting his left lower thoracic cavity on a handlebar.  He caught himself.  He did not hit his head.  He has no symptoms.  He states that he has a large bruise over his left upper quadrant and that when he presented to physical therapy today, they stated that since he had a fall that had not been evaluated he could not proceed with his physical therapy so he comes in today to get clearance to return to physical therapy.  He denies fevers or chills nausea vomiting syncope or shortness of breath.   Patient's recorded medical, surgical, social, medication list and allergies were reviewed in the Snapshot window as part of the initial history.   Review of Systems   Review of Systems  Constitutional:  Negative for chills and fever.  HENT:  Negative for ear pain and sore throat.   Eyes:  Negative for pain and visual disturbance.  Respiratory:  Negative for cough and shortness of breath.   Cardiovascular:  Negative for chest pain and palpitations.  Gastrointestinal:  Negative for abdominal pain and vomiting.  Genitourinary:  Negative for dysuria and hematuria.  Musculoskeletal:  Negative for arthralgias and back pain.  Skin:  Negative for color change and rash.  Neurological:  Negative for seizures and syncope.  All other systems reviewed and are negative.   Physical Exam Updated Vital Signs BP 119/70 (BP Location: Right Arm)   Pulse 71   Temp (!) 97.3 F (36.3 C)   Resp 16   Ht '5\' 7"'$  (1.702 m)   Wt 84.9 kg   SpO2 100%   BMI 29.32 kg/m  Physical Exam Vitals and nursing note reviewed.  Constitutional:      General: He is not  in acute distress.    Appearance: He is well-developed.  HENT:     Head: Normocephalic and atraumatic.  Eyes:     Conjunctiva/sclera: Conjunctivae normal.  Cardiovascular:     Rate and Rhythm: Normal rate and regular rhythm.     Heart sounds: No murmur heard. Pulmonary:     Effort: Pulmonary effort is normal. No respiratory distress.     Breath sounds: Normal breath sounds.  Abdominal:     Palpations: Abdomen is soft.     Tenderness: There is no abdominal tenderness.  Musculoskeletal:        General: Signs of injury (Bruising appreciated to left upper quadrant.  However it is not tender to palpation.  Ribs palpated put under anterior and lateral stress with no pain.) present. No swelling.     Cervical back: Neck supple.  Skin:    General: Skin is warm and dry.     Capillary Refill: Capillary refill takes less than 2 seconds.  Neurological:     Mental Status: He is alert.  Psychiatric:        Mood and Affect: Mood normal.      ED Course/ Medical Decision Making/ A&P    Procedures Procedures   Medications Ordered in ED Medications - No data to display Medical Decision Making:    [name] is  a 71 y.o. male who presented to the ED today with a moderate mechanisma trauma, detailed above.    Patient's presentation is complicated by their history of advanced age, and multiple comorbid medical conditions on multidrug outpatient therapy.  Patient placed on continuous vitals and telemetry monitoring while in ED which was reviewed periodically.   Given this mechanism of trauma, a full physical exam was performed. Notably, patient was hemodynamically stable in no acute distress.  Has bruising to his left upper quadrant without any acute pain at the site.   Reviewed and confirmed nursing documentation for past medical history, family history, social history.    Initial Assessment/Plan:   This is a patient presenting with a moderate mechanism trauma.  As such, I have  considered intracranial injuries including intracranial hemorrhage, intrathoracic injuries including blunt myocardial or blunt lung injury, blunt abdominal injuries including aortic dissection, bladder injury, spleen injury, liver injury and I have considered orthopedic injuries including extremity or spinal injury.   With the patient's presentation of moderate mechanism trauma but with an otherwise reassuring exam, there is no indication for further objective evaluation at this time. Patient ambulating, tolerating PO intake and in no acute distress stable for continued OP care and management. Supportive care reinforced and patient is overall well appearing in no acute distress stable for continued OP care and management.       Clinical Impression:  1. Fall, initial encounter      Discharge   Final Clinical Impression(s) / ED Diagnoses Final diagnoses:  Fall, initial encounter    Rx / DC Orders ED Discharge Orders     None         Tretha Sciara, MD 10/08/21 2316

## 2021-10-11 DIAGNOSIS — R059 Cough, unspecified: Secondary | ICD-10-CM | POA: Diagnosis not present

## 2021-10-12 DIAGNOSIS — M6281 Muscle weakness (generalized): Secondary | ICD-10-CM | POA: Diagnosis not present

## 2021-10-12 DIAGNOSIS — M25561 Pain in right knee: Secondary | ICD-10-CM | POA: Diagnosis not present

## 2021-10-12 DIAGNOSIS — R2689 Other abnormalities of gait and mobility: Secondary | ICD-10-CM | POA: Diagnosis not present

## 2021-10-14 DIAGNOSIS — M79672 Pain in left foot: Secondary | ICD-10-CM | POA: Diagnosis not present

## 2021-10-14 DIAGNOSIS — Z6829 Body mass index (BMI) 29.0-29.9, adult: Secondary | ICD-10-CM | POA: Diagnosis not present

## 2021-10-14 DIAGNOSIS — E663 Overweight: Secondary | ICD-10-CM | POA: Diagnosis not present

## 2021-10-14 DIAGNOSIS — M79671 Pain in right foot: Secondary | ICD-10-CM | POA: Diagnosis not present

## 2021-10-14 DIAGNOSIS — M2540 Effusion, unspecified joint: Secondary | ICD-10-CM | POA: Diagnosis not present

## 2021-10-14 DIAGNOSIS — M79641 Pain in right hand: Secondary | ICD-10-CM | POA: Diagnosis not present

## 2021-10-14 DIAGNOSIS — M79642 Pain in left hand: Secondary | ICD-10-CM | POA: Diagnosis not present

## 2021-10-15 DIAGNOSIS — A6002 Herpesviral infection of other male genital organs: Secondary | ICD-10-CM | POA: Diagnosis not present

## 2021-10-15 DIAGNOSIS — Z8673 Personal history of transient ischemic attack (TIA), and cerebral infarction without residual deficits: Secondary | ICD-10-CM | POA: Diagnosis not present

## 2021-10-15 DIAGNOSIS — E7800 Pure hypercholesterolemia, unspecified: Secondary | ICD-10-CM | POA: Diagnosis not present

## 2021-10-15 DIAGNOSIS — J852 Abscess of lung without pneumonia: Secondary | ICD-10-CM | POA: Diagnosis not present

## 2021-10-15 DIAGNOSIS — I10 Essential (primary) hypertension: Secondary | ICD-10-CM | POA: Diagnosis not present

## 2021-10-15 DIAGNOSIS — M79641 Pain in right hand: Secondary | ICD-10-CM | POA: Diagnosis not present

## 2021-10-15 DIAGNOSIS — M129 Arthropathy, unspecified: Secondary | ICD-10-CM | POA: Diagnosis not present

## 2021-10-15 DIAGNOSIS — M79642 Pain in left hand: Secondary | ICD-10-CM | POA: Diagnosis not present

## 2021-10-15 DIAGNOSIS — E559 Vitamin D deficiency, unspecified: Secondary | ICD-10-CM | POA: Diagnosis not present

## 2021-10-15 DIAGNOSIS — R7303 Prediabetes: Secondary | ICD-10-CM | POA: Diagnosis not present

## 2021-10-15 DIAGNOSIS — N529 Male erectile dysfunction, unspecified: Secondary | ICD-10-CM | POA: Diagnosis not present

## 2021-10-15 DIAGNOSIS — N1832 Chronic kidney disease, stage 3b: Secondary | ICD-10-CM | POA: Diagnosis not present

## 2021-10-19 DIAGNOSIS — Z7901 Long term (current) use of anticoagulants: Secondary | ICD-10-CM | POA: Diagnosis not present

## 2021-10-19 DIAGNOSIS — M25561 Pain in right knee: Secondary | ICD-10-CM | POA: Diagnosis not present

## 2021-10-19 DIAGNOSIS — Z01818 Encounter for other preprocedural examination: Secondary | ICD-10-CM | POA: Diagnosis not present

## 2021-10-19 DIAGNOSIS — R2689 Other abnormalities of gait and mobility: Secondary | ICD-10-CM | POA: Diagnosis not present

## 2021-10-19 DIAGNOSIS — M6281 Muscle weakness (generalized): Secondary | ICD-10-CM | POA: Diagnosis not present

## 2021-10-20 DIAGNOSIS — F331 Major depressive disorder, recurrent, moderate: Secondary | ICD-10-CM | POA: Diagnosis not present

## 2021-10-20 DIAGNOSIS — F411 Generalized anxiety disorder: Secondary | ICD-10-CM | POA: Diagnosis not present

## 2021-10-21 DIAGNOSIS — F332 Major depressive disorder, recurrent severe without psychotic features: Secondary | ICD-10-CM | POA: Diagnosis not present

## 2021-10-22 DIAGNOSIS — M6281 Muscle weakness (generalized): Secondary | ICD-10-CM | POA: Diagnosis not present

## 2021-10-22 DIAGNOSIS — M25561 Pain in right knee: Secondary | ICD-10-CM | POA: Diagnosis not present

## 2021-10-22 DIAGNOSIS — R2689 Other abnormalities of gait and mobility: Secondary | ICD-10-CM | POA: Diagnosis not present

## 2021-10-26 DIAGNOSIS — M25561 Pain in right knee: Secondary | ICD-10-CM | POA: Diagnosis not present

## 2021-10-26 DIAGNOSIS — R2689 Other abnormalities of gait and mobility: Secondary | ICD-10-CM | POA: Diagnosis not present

## 2021-10-26 DIAGNOSIS — M6281 Muscle weakness (generalized): Secondary | ICD-10-CM | POA: Diagnosis not present

## 2021-10-26 NOTE — Progress Notes (Signed)
Sent message, via epic in basket, requesting orders in epic from surgeon.  

## 2021-10-28 DIAGNOSIS — F331 Major depressive disorder, recurrent, moderate: Secondary | ICD-10-CM | POA: Diagnosis not present

## 2021-10-28 DIAGNOSIS — F411 Generalized anxiety disorder: Secondary | ICD-10-CM | POA: Diagnosis not present

## 2021-10-29 DIAGNOSIS — M25561 Pain in right knee: Secondary | ICD-10-CM | POA: Diagnosis not present

## 2021-10-29 DIAGNOSIS — R2689 Other abnormalities of gait and mobility: Secondary | ICD-10-CM | POA: Diagnosis not present

## 2021-10-29 DIAGNOSIS — M6281 Muscle weakness (generalized): Secondary | ICD-10-CM | POA: Diagnosis not present

## 2021-10-29 NOTE — H&P (Signed)
TOTAL KNEE ADMISSION H&P  Patient is being admitted for right total knee arthroplasty.  Subjective:  Chief Complaint: Right knee pain.  HPI: [name], 71 y.o. male has a history of pain and functional disability in the right knee due to arthritis and has failed non-surgical conservative treatments for greater than 12 weeks to include corticosteriod injections and activity modification. Onset of symptoms was gradual, starting >10 years ago with gradually worsening course since that time. The patient noted no past surgery on the right knee.  Patient currently rates pain in the right knee at 9 out of 10 with activity. Patient has night pain, worsening of pain with activity and weight bearing, pain with passive range of motion, and crepitus. Patient has evidence of  valgus deformity with bone-on-bone arthritis in the patellofemoral and lateral compartments  by imaging studies. There is no active infection.  Patient Active Problem List   Diagnosis Date Noted   Mixed hyperlipidemia 09/24/2021   AKI (acute kidney injury) ([place]) 04/09/2021   Recurrent major depressive disorder, in full remission ([place]) 01/07/2020   Chronic heart failure with preserved ejection fraction ([place]) 01/02/2020   Essential hypertension 01/02/2020   Right upper quadrant abdominal pain 12/16/2019   Precordial chest pain 12/15/2019   Elevated transaminase level 12/15/2019   MDD (major depressive disorder), recurrent, in partial remission ([place]) 02/07/2019   Severe recurrent major depression without psychotic features ([place]) 02/02/2019   Tardive dyskinesia 12/03/2018   Pure hypercholesterolemia 06/14/2017   History of stroke 11/11/2015   Memory loss 10/16/2013   Tremor 04/04/2013    Past Medical History:  Diagnosis Date   (HFpEF) heart failure with preserved ejection fraction (HCC)    Depression    Frequent urination    GERD (gastroesophageal reflux disease)    Headache    mirgaines in the past   Herpes     History of nuclear stress test    Myoview 05/2021: EF 77, normal perfusion, low risk   Hyperlipidemia    Hypertension    Memory loss    Obesity    Stroke [facility])    Urgency of urination     Past Surgical History:  Procedure Laterality Date   CHOLECYSTECTOMY N/A 12/17/2019   Procedure: LAPAROSCOPIC CHOLECYSTECTOMY WITH ICG INJECTION;  Surgeon: Rolm Bookbinder, MD;  Location: [facility];  Service: General;  Laterality: N/A;   disckec     SPINAL FUSION     TONSILLECTOMY     VASECTOMY     no per pt   WISDOM TOOTH EXTRACTION      Prior to Admission medications   Medication Sig Start Date End Date Taking? Authorizing Provider  amLODipine (NORVASC) 5 MG tablet Take 1 tablet (5 mg total) by mouth daily. 03/12/20   Chandrasekhar, Lyda Kalata A, MD  candesartan (ATACAND) 32 MG tablet Take 32 mg by mouth daily. 11/22/19   [provider]  clopidogrel (PLAVIX) 75 MG tablet Take 1 tablet (75 mg total) by mouth daily. 12/18/19   Shawna Clamp, MD  furosemide (LASIX) 40 MG tablet Take 1 tablet (40 mg total) by mouth 2 (two) times daily. 07/01/20   Chandrasekhar, Lyda Kalata A, MD  gabapentin (NEURONTIN) 400 MG capsule Take 400 mg by mouth 2 (two) times daily.    [provider]  hydrALAZINE (APRESOLINE) 50 MG tablet Take 1 tablet (50 mg total) by mouth 2 (two) times daily. 03/16/20   Rudean Haskell A, MD  metoprolol succinate (TOPROL XL) 25 MG 24 hr tablet Take 1 tablet (25  mg total) by mouth daily. 09/24/21   Chandrasekhar, Terisa Starr, MD  PARoxetine (PAXIL-CR) 37.5 MG 24 hr tablet Take 1 tablet (37.5 mg total) by mouth at bedtime. 09/03/21   Salley Slaughter, NP  primidone (MYSOLINE) 250 MG tablet TAKE 1 TABLET BY MOUTH TWICE A DAY 11/09/20   Frann Rider, NP  QUEtiapine (SEROQUEL) 25 MG tablet TAKE 3 TABLETS BY MOUTH AT BEDTIME 09/03/21   Eulis Canner E, NP  rosuvastatin (CRESTOR) 20 MG tablet Take 20 mg by mouth daily.    [provider]  sildenafil (VIAGRA) 50 MG tablet  Take 100 mg by mouth as needed for erectile dysfunction.    [provider]  spironolactone (ALDACTONE) 25 MG tablet Take 25 mg by mouth daily. 09/14/20   [provider]  Suvorexant (BELSOMRA) 10 MG TABS Take 1 tablet by mouth at bedtime. 09/03/21   Salley Slaughter, NP  traZODone (DESYREL) 150 MG tablet Take 1 tablet (150 mg total) by mouth at bedtime as needed. for sleep 09/03/21   Salley Slaughter, NP  valACYclovir (VALTREX) 1000 MG tablet Take 1,000 mg by mouth daily.    [provider]  Vitamin D, Ergocalciferol, (DRISDOL) 1.25 MG (50000 UNIT) CAPS capsule Take 50,000 Units by mouth once a week. 09/17/20   [provider]    Allergies  Allergen Reactions   Aspirin Other (See Comments)    Dizziness, nausea and vomiting Balance issues   Lisinopril Other (See Comments)    Critical Cough   Lithium Other (See Comments)    Critical Per pt reaction unknown   Oxycodone Nausea And Vomiting   Oxycodone Hcl Other (See Comments)   Oxycodone-Acetaminophen Nausea And Vomiting and Other (See Comments)    Also causes dizziness   Pork-Derived Products Other (See Comments)    Unspecified reaction    Social History   Socioeconomic History   Marital status: Divorced    Spouse name: Not on file   Number of children: 1   Years of education: Masters   Highest education level: Not on file  Occupational History   Occupation: retired    Fish farm manager: RETIRED  Tobacco Use   Smoking status: Former   Smokeless tobacco: Never  Scientific laboratory technician Use: Never used  Substance and Sexual Activity   Alcohol use: No    Alcohol/week: 1.0 standard drink of alcohol    Types: 1 Cans of beer per week    Comment: socially   Drug use: No   Sexual activity: Never  Other Topics Concern   Not on file  Social History Narrative   Patient lives in [place] [facility] @ [place]   Patient is right-handed.   Patient does not drink any caffeine.   Social Determinants  of Health   Financial Resource Strain: Not on file  Food Insecurity: Not on file  Transportation Needs: Not on file  Physical Activity: Not on file  Stress: Not on file  Social Connections: Not on file  Intimate Partner Violence: Not on file    Tobacco Use: Medium Risk (10/08/2021)   Patient History    Smoking Tobacco Use: Former    Smokeless Tobacco Use: Never    Passive Exposure: Not on file   Social History   Substance and Sexual Activity  Alcohol Use No   Alcohol/week: 1.0 standard drink of alcohol   Types: 1 Cans of beer per week   Comment: socially    Family History  Problem Relation Age of  Onset   Hypertension Mother    Hypertension Father    Hypertension Sister    Hypertension Brother    Cancer Maternal Aunt    Cancer - Ovarian Maternal Grandmother    Diabetes Mellitus I Paternal Grandmother    Hypertension Paternal Grandfather    Cancer Maternal Aunt     Review of Systems  Constitutional:  Negative for chills and fever.  HENT:  Negative for congestion, sore throat and tinnitus.   Eyes:  Negative for double vision, photophobia and pain.  Respiratory:  Negative for cough, shortness of breath and wheezing.   Cardiovascular:  Negative for chest pain, palpitations and orthopnea.  Gastrointestinal:  Negative for heartburn, nausea and vomiting.  Genitourinary:  Negative for dysuria, frequency and urgency.  Musculoskeletal:  Positive for joint pain.  Neurological:  Negative for dizziness, weakness and headaches.    Objective:  Physical Exam: Well nourished and well developed.  General: Alert and oriented x3, cooperative and pleasant, no acute distress.  Head: normocephalic, atraumatic, neck supple.  Eyes: EOMI.  Musculoskeletal:  Right Knee Exam:   Valgus deformity.   No effusion present. No swelling present.   Range of motion: A couple of degrees of hyperextension to 120 degrees of flexion.   No crepitus on range of motion of the knee.   No medial  joint line tenderness.   No lateral joint line tenderness.   He has pseudolaxity that he corrects to neutral from the valgus.   He has slight AP laxity.   Calves soft and nontender. Motor function intact in LE. Strength 5/5 LE bilaterally. Neuro: Distal pulses 2+. Sensation to light touch intact in LE.   Imaging Review Plain radiographs demonstrate severe degenerative joint disease of the right knee. The overall alignment is significant valgus. The bone quality appears to be adequate for age and reported activity level.  Assessment/Plan:  End stage arthritis, right knee   The patient history, physical examination, clinical judgment of the provider and imaging studies are consistent with end stage degenerative joint disease of the right knee and total knee arthroplasty is deemed medically necessary. The treatment options including medical management, injection therapy arthroscopy and arthroplasty were discussed at length. The risks and benefits of total knee arthroplasty were presented and reviewed. The risks due to aseptic loosening, infection, stiffness, patella tracking problems, thromboembolic complications and other imponderables were discussed. The patient acknowledged the explanation, agreed to proceed with the plan and consent was signed. Patient is being admitted for inpatient treatment for surgery, pain control, PT, OT, prophylactic antibiotics, VTE prophylaxis, progressive ambulation and ADLs and discharge planning. The patient is planning to be discharged to skilled nursing facility.  Anticipated LOS equal to or greater than 2 midnights due to - Age 37 and older with one or more of the following:  - Obesity  - Expected need for hospital services (PT, OT, Nursing) required for safe  discharge  - Anticipated need for postoperative skilled nursing care or inpatient rehab  - Active co-morbidities: Heart Failure and Stroke OR   - Unanticipated findings during/Post Surgery: None  -  Patient is a high risk of re-admission due to: None  Therapy Plans: SNF (daughter has chosen [facility] as preference) Disposition: SNF Planned DVT Prophylaxis: ** Currently on Plavix - May need to switch to Xarelto for 3 weeks, cannot tolerate ASA ** DME Needed: Gilford Rile PCP:  [place], MD Cardiologist: Rudean Haskell, MD (clearance received - 09/28/21) TXA: IV Allergies: Aspirin (dizziness, nausea), oxycodone (dizziness/syncope, vomiting) Anesthesia Concerns:  None BMI: 30.6 Last HgbA1c: Not diabetic.  Pharmacy: N/A (SNF)  Other: - Daughter states patient has low threshold for pain, but cannot tolerate pain medications well. Allergy reported to oxycodone. Patient also states hydrocodone caused severe depression. Will likely try low dose of dilaudid with IV pain medications.   - Patient was instructed on what medications to stop prior to surgery. - Follow-up visit in 2 weeks with Dr. Wynelle Link - Begin physical therapy following surgery - Pre-operative lab work as pre-surgical testing - Prescriptions will be provided in hospital at time of discharge  Theresa Duty, PA-C Orthopedic Surgery [facility] [place]

## 2021-11-01 NOTE — Patient Instructions (Signed)
SURGICAL WAITING ROOM VISITATION Patients having surgery or a procedure may have no more than 2 support people in the waiting area - these visitors may rotate.   Children under the age of 60 must have an adult with them who is not the patient. If the patient needs to stay at the hospital during part of their recovery, the visitor guidelines for inpatient rooms apply. Pre-op nurse will coordinate an appropriate time for 1 support person to accompany patient in pre-op.  This support person may not rotate.    Please refer to the [facility] website for the visitor guidelines for Inpatients (after your surgery is over and you are in a regular room).    Your procedure is scheduled on: 11/15/21   Report to [facility] Main Entrance    Report to admitting at 9:00 AM   Call this number if you have problems the morning of surgery [phone]   Do not eat food :After Midnight.   After Midnight you may have the following liquids until 8:35 AM DAY OF SURGERY  Water Non-Citrus Juices (without pulp, NO RED) Carbonated Beverages Black Coffee (NO MILK/CREAM OR CREAMERS, sugar ok)  Clear Tea (NO MILK/CREAM OR CREAMERS, sugar ok) regular and decaf                             Plain Jell-O (NO RED)                                           Fruit ices (not with fruit pulp, NO RED)                                     Popsicles (NO RED)                                                               Sports drinks like Gatorade (NO RED)                 The day of surgery:  Drink ONE (1) Pre-Surgery Clear Ensure at 8:35 AM the morning of surgery. Drink in one sitting. Do not sip.  This drink was given to you during your hospital  pre-op appointment visit. Nothing else to drink after completing the  Pre-Surgery Clear Ensure.          If you have questions, please contact your surgeon's office.   FOLLOW BOWEL PREP AND ANY ADDITIONAL PRE OP INSTRUCTIONS YOU RECEIVED FROM YOUR SURGEON'S  OFFICE!!!     Oral Hygiene is also important to reduce your risk of infection.                                    Remember - BRUSH YOUR TEETH THE MORNING OF SURGERY WITH YOUR REGULAR TOOTHPASTE   Take these medicines the morning of surgery with A SIP OF WATER: Amlodipine, Gabapentin, Hydralazine, Metoprolol, Primidone, Rosuvastatin.  You may not have any metal on your body including jewelry, and body piercing             Do not wear lotions, powders, cologne, or deodorant              Men may shave face and neck.   Do not bring valuables to the hospital. [place].   Bring small overnight bag day of surgery.   DO NOT [place]. PHARMACY WILL DISPENSE MEDICATIONS LISTED ON YOUR MEDICATION LIST TO YOU DURING YOUR ADMISSION [place]!    Special Instructions: Bring a copy of your healthcare power of attorney and living will documents the day of surgery if you haven't scanned them before.              Please read over the following fact sheets you were given: IF [place] [place] [phone] Schneiders   If you received a COVID test during your pre-op visit  it is requested that you wear a mask when out in public, stay away from anyone that may not be feeling well and notify your surgeon if you develop symptoms. If you test positive for Covid or have been in contact with anyone that has tested positive in the last 10 days please notify you surgeon.     [facility] - Preparing for Surgery Before surgery, you can play an important role.  Because skin is not sterile, your skin needs to be as free of germs as possible.  You can reduce the number of germs on your skin by washing with CHG (chlorahexidine gluconate) soap before surgery.  CHG is an antiseptic cleaner which kills germs and bonds with the skin to continue killing germs even  after washing. Please DO NOT use if you have an allergy to CHG or antibacterial soaps.  If your skin becomes reddened/irritated stop using the CHG and inform your nurse when you arrive at Short Stay. Do not shave (including legs and underarms) for at least 48 hours prior to the first CHG shower.  You may shave your face/neck.  Please follow these instructions carefully:  1.  Shower with CHG Soap the night before surgery and the  morning of surgery.  2.  If you choose to wash your hair, wash your hair first as usual with your normal  shampoo.  3.  After you shampoo, rinse your hair and body thoroughly to remove the shampoo.                             4.  Use CHG as you would any other liquid soap.  You can apply chg directly to the skin and wash.  Gently with a scrungie or clean washcloth.  5.  Apply the CHG Soap to your body ONLY FROM THE NECK DOWN.   Do   not use on face/ open                           Wound or open sores. Avoid contact with eyes, ears mouth and   genitals (private parts).                       Wash face,  Development worker, international aid (private parts)  with your normal soap.             6.  Wash thoroughly, paying special attention to the area where your    surgery  will be performed.  7.  Thoroughly rinse your body with warm water from the neck down.  8.  DO NOT shower/wash with your normal soap after using and rinsing off the CHG Soap.                9.  Pat yourself dry with a clean towel.            10.  Wear clean pajamas.            11.  Place clean sheets on your bed the night of your first shower and do not  sleep with pets. Day of Surgery : Do not apply any lotions/deodorants the morning of surgery.  Please wear clean clothes to the hospital/surgery center.  FAILURE TO FOLLOW THESE INSTRUCTIONS MAY RESULT IN THE CANCELLATION OF YOUR SURGERY  PATIENT SIGNATURE_________________________________  NURSE  SIGNATURE__________________________________  ________________________________________________________________________   Adam Phenix  An incentive spirometer is a tool that can help keep your lungs clear and active. This tool measures how well you are filling your lungs with each breath. Taking long deep breaths may help reverse or decrease the chance of developing breathing (pulmonary) problems (especially infection) following: A long period of time when you are unable to move or be active. BEFORE THE PROCEDURE  If the spirometer includes an indicator to show your best effort, your nurse or respiratory therapist will set it to a desired goal. If possible, sit up straight or lean slightly forward. Try not to slouch. Hold the incentive spirometer in an upright position. INSTRUCTIONS FOR USE  Sit on the edge of your bed if possible, or sit up as far as you can in bed or on a chair. Hold the incentive spirometer in an upright position. Breathe out normally. Place the mouthpiece in your mouth and seal your lips tightly around it. Breathe in slowly and as deeply as possible, raising the piston or the ball toward the top of the column. Hold your breath for 3-5 seconds or for as long as possible. Allow the piston or ball to fall to the bottom of the column. Remove the mouthpiece from your mouth and breathe out normally. Rest for a few seconds and repeat Steps 1 through 7 at least 10 times every 1-2 hours when you are awake. Take your time and take a few normal breaths between deep breaths. The spirometer may include an indicator to show your best effort. Use the indicator as a goal to work toward during each repetition. After each set of 10 deep breaths, practice coughing to be sure your lungs are clear. If you have an incision (the cut made at the time of surgery), support your incision when coughing by placing a pillow or rolled up towels firmly against it. Once you are able to get out of  bed, walk around indoors and cough well. You may stop using the incentive spirometer when instructed by your caregiver.  RISKS AND COMPLICATIONS Take your time so you do not get dizzy or light-headed. If you are in pain, you may need to take or ask for pain medication before doing incentive spirometry. It is harder to take a deep breath if you are having pain. AFTER USE Rest and breathe slowly and easily. It can be helpful to keep track of a log  of your progress. Your caregiver can provide you with a simple table to help with this. If you are using the spirometer at home, follow these instructions: [place] IF:  You are having difficultly using the spirometer. You have trouble using the spirometer as often as instructed. Your pain medication is not giving enough relief while using the spirometer. You develop fever of 100.5 F (38.1 C) or higher. SEEK IMMEDIATE MEDICAL CARE IF:  You cough up bloody sputum that had not been present before. You develop fever of 102 F (38.9 C) or greater. You develop worsening pain at or near the incision site. MAKE SURE YOU:  Understand these instructions. Will watch your condition. Will get help right away if you are not doing well or get worse. Document Released: 05/16/2006 Document Revised: 03/28/2011 Document Reviewed: 07/17/2006 [facility] Patient Information 2014 [place], [place].   ________________________________________________________________________

## 2021-11-01 NOTE — Progress Notes (Addendum)
Provided patient with number to call pharmacy as he does not know his medication list and did not bring one.  COVID Vaccine Completed: yes  Date of COVID positive in last 90 days: no  PCP - Knox Royalty, MD Cardiologist - Riley Lam, MD  Cardiac clearance by Riley Lam 09/24/21 in Epic  Neurologic clearance by Elspeth Cho 10/21/21 on chart  Chest x-ray - yes EKG - 05/19/21 Epic Stress Test - 05/20/21 Epic ECHO - 12/15/19 Epic Cardiac Cath - n/a Pacemaker/ICD device last checked: n/a Spinal Cord Stimulator: n/a  Bowel Prep - no  Sleep Study - has not done sleep study CPAP -   Fasting Blood Sugar - n/a Checks Blood Sugar _____ times a day  Blood Thinner Instructions: Plavix, hold 5 days Aspirin Instructions: Last Dose: 11/09/21 0530  Activity level: Can perform activities of daily living without stopping and without symptoms of chest pain or shortness of breath. Can go up and down stairs but it is difficult due to knee pain.  Anesthesia review: stroke, HTN, HFrEF, CHF  Patient denies shortness of breath, fever, cough and chest pain at PAT appointment  Patient verbalized understanding of instructions that were given to them at the PAT appointment. Patient was also instructed that they will need to review over the PAT instructions again at home before surgery.

## 2021-11-02 ENCOUNTER — Encounter (HOSPITAL_COMMUNITY)
Admission: RE | Admit: 2021-11-02 | Discharge: 2021-11-02 | Disposition: A | Discharge status: Home / Self Care | Payer: Medicare Other | Source: Ambulatory Visit | Attending: Orthopedic Surgery | Admitting: Orthopedic Surgery

## 2021-11-02 ENCOUNTER — Encounter (HOSPITAL_COMMUNITY): Payer: Self-pay

## 2021-11-02 ENCOUNTER — Telehealth: Payer: Self-pay | Admitting: Neurology

## 2021-11-02 VITALS — BP 123/78 | HR 61 | Temp 98.2°F | Resp 14 | Ht 67.0 in | Wt 187.2 lb

## 2021-11-02 DIAGNOSIS — Z01818 Encounter for other preprocedural examination: Secondary | ICD-10-CM

## 2021-11-02 DIAGNOSIS — I10 Essential (primary) hypertension: Secondary | ICD-10-CM | POA: Diagnosis not present

## 2021-11-02 DIAGNOSIS — Z01812 Encounter for preprocedural laboratory examination: Secondary | ICD-10-CM | POA: Diagnosis not present

## 2021-11-02 HISTORY — DX: Unspecified osteoarthritis, unspecified site: M19.90

## 2021-11-02 LAB — BASIC METABOLIC PANEL
Anion gap: 6 (ref 5–15)
BUN: 12 mg/dL (ref 8–23)
CO2: 27 mmol/L (ref 22–32)
Calcium: 9 mg/dL (ref 8.9–10.3)
Chloride: 107 mmol/L (ref 98–111)
Creatinine, Ser: 1.19 mg/dL (ref 0.61–1.24)
GFR, Estimated: >60 mL/min (ref >60)
Glucose, Bld: 88 mg/dL (ref 70–99)
Potassium: 3.9 mmol/L (ref 3.5–5.1)
Sodium: 140 mmol/L (ref 135–145)

## 2021-11-02 LAB — CBC
HCT: 35.1 % — ABNORMAL LOW (ref 39.0–52.0)
Hemoglobin: 11.8 g/dL — ABNORMAL LOW (ref 13.0–17.0)
MCH: 34.5 pg — ABNORMAL HIGH (ref 26.0–34.0)
MCHC: 33.6 g/dL (ref 30.0–36.0)
MCV: 102.6 fL — ABNORMAL HIGH (ref 80.0–100.0)
Platelets: 349 10*3/uL (ref 150–400)
RBC: 3.42 MIL/uL — ABNORMAL LOW (ref 4.22–5.81)
RDW: 12.5 % (ref 11.5–15.5)
WBC: 6.4 10*3/uL (ref 4.0–10.5)
nRBC: 0 % (ref 0.0–0.2)

## 2021-11-02 LAB — SURGICAL PCR SCREEN
MRSA, PCR: NEGATIVE
Staphylococcus aureus: NEGATIVE

## 2021-11-02 NOTE — Telephone Encounter (Signed)
Preoperative clearance form faxed to [facility] on 10/17

## 2021-11-03 DIAGNOSIS — R197 Diarrhea, unspecified: Secondary | ICD-10-CM | POA: Diagnosis not present

## 2021-11-04 DIAGNOSIS — M25561 Pain in right knee: Secondary | ICD-10-CM | POA: Diagnosis not present

## 2021-11-04 DIAGNOSIS — R2689 Other abnormalities of gait and mobility: Secondary | ICD-10-CM | POA: Diagnosis not present

## 2021-11-04 DIAGNOSIS — R059 Cough, unspecified: Secondary | ICD-10-CM | POA: Diagnosis not present

## 2021-11-04 DIAGNOSIS — R7309 Other abnormal glucose: Secondary | ICD-10-CM | POA: Diagnosis not present

## 2021-11-04 DIAGNOSIS — R0982 Postnasal drip: Secondary | ICD-10-CM | POA: Diagnosis not present

## 2021-11-04 DIAGNOSIS — Z6831 Body mass index (BMI) 31.0-31.9, adult: Secondary | ICD-10-CM | POA: Diagnosis not present

## 2021-11-04 DIAGNOSIS — M6281 Muscle weakness (generalized): Secondary | ICD-10-CM | POA: Diagnosis not present

## 2021-11-05 DIAGNOSIS — M25561 Pain in right knee: Secondary | ICD-10-CM | POA: Diagnosis not present

## 2021-11-05 DIAGNOSIS — R2689 Other abnormalities of gait and mobility: Secondary | ICD-10-CM | POA: Diagnosis not present

## 2021-11-05 DIAGNOSIS — M6281 Muscle weakness (generalized): Secondary | ICD-10-CM | POA: Diagnosis not present

## 2021-11-05 NOTE — Progress Notes (Addendum)
Anesthesia Chart Review   Case: [id number] Date/Time: 11/15/21 1120   Procedure: TOTAL KNEE ARTHROPLASTY (Right: Knee)   Anesthesia type: Choice   Pre-op diagnosis: right knee osteoarthritis   Location: [place] [facility] / [facility]   Surgeons: Gaynelle Arabian, MD       DISCUSSION:71 y.o. former smoker with h/o HTN, CVA, CHF, right knee OA scheduled for above procedure 11/15/2021 with Dr. Gaynelle Arabian.   Pt last seen by cardiology 09/24/2021. Per OV note, "Pre-op risk evaluation - low risk given normal stress test, reasonable to proceed with surgery  - plavix can be held for procedure (no cardiac indication, sees Dr. [name] in f/u in 05/2022) - send copy of our note to Dr. Wynelle Link"  Clearance also received from neurology. Pt advised to hold Plavix 5 days prior to procedure.   Anticipate pt can proceed with planned procedure barring acute status change.   VS: BP 123/78   Pulse 61   Temp 36.8 C (Oral)   Resp 14   Ht '5\' 7"'$  (1.702 m)   Wt 84.9 kg   SpO2 100%   BMI 29.32 kg/m   PROVIDERS: Kristie Cowman, MD is PCP   Cardiologist - Rudean Haskell, MD LABS: Labs reviewed: Acceptable for surgery. (all labs ordered are listed, but only abnormal results are displayed)  Labs Reviewed  CBC - Abnormal; Notable for the following components:      Result Value   RBC 3.42 (*)    Hemoglobin 11.8 (*)    HCT 35.1 (*)    MCV 102.6 (*)    MCH 34.5 (*)    All other components within normal limits  SURGICAL PCR SCREEN  BASIC METABOLIC PANEL     IMAGES:   EKG:   CV:  Myocardial Perfusion 05/20/2021   The study is normal. The study is low risk.   No ST deviation was noted.   LV perfusion is normal.   Left ventricular function is normal. Nuclear stress EF: 77 %. The left ventricular ejection fraction is hyperdynamic (>65%). End diastolic cavity size is normal. End systolic cavity size is normal.   Prior study not available for comparison.   Normal perfusion. LVEF 77% with normal wall  motion. This is a low risk study. No prior for comparison.   Echo 12/15/2019 1. Left ventricular ejection fraction, by estimation, is 60 to 65%. The  left ventricle has normal function. The left ventricle has no regional  wall motion abnormalities. There is mild concentric left ventricular  hypertrophy. Left ventricular diastolic  parameters are consistent with Grade II diastolic dysfunction  (pseudonormalization). Elevated left atrial pressure.   2. Right ventricular systolic function is normal. The right ventricular  size is mildly enlarged. There is normal pulmonary artery systolic  pressure.   3. Left atrial size was severely dilated.   4. Right atrial size was moderately dilated.   5. The mitral valve is normal in structure. No evidence of mitral valve  regurgitation. No evidence of mitral stenosis.   6. The aortic valve is normal in structure. Aortic valve regurgitation is  not visualized. Mild aortic valve sclerosis is present, with no evidence  of aortic valve stenosis.   7. The inferior vena cava is normal in size with greater than 50%  respiratory variability, suggesting right atrial pressure of 3 mmHg.  Past Medical History:  Diagnosis Date   (HFpEF) heart failure with preserved ejection fraction (HCC)    Arthritis    Depression    Frequent urination  GERD (gastroesophageal reflux disease)    Headache    mirgaines in the past   Herpes    History of nuclear stress test    Myoview 05/2021: EF 77, normal perfusion, low risk   Hyperlipidemia    Hypertension    Memory loss    Obesity    Stroke [facility])    Urgency of urination     Past Surgical History:  Procedure Laterality Date   CHOLECYSTECTOMY N/A 12/17/2019   Procedure: LAPAROSCOPIC CHOLECYSTECTOMY WITH ICG INJECTION;  Surgeon: Rolm Bookbinder, MD;  Location: [facility];  Service: General;  Laterality: N/A;   disckec     shouler arthroscopy Left    SPINAL FUSION     TONSILLECTOMY     VASECTOMY     no per pt    WISDOM TOOTH EXTRACTION      MEDICATIONS:  amLODipine (NORVASC) 5 MG tablet   candesartan (ATACAND) 32 MG tablet   clopidogrel (PLAVIX) 75 MG tablet   furosemide (LASIX) 40 MG tablet   gabapentin (NEURONTIN) 400 MG capsule   hydrALAZINE (APRESOLINE) 50 MG tablet   metoprolol succinate (TOPROL XL) 25 MG 24 hr tablet   PARoxetine (PAXIL-CR) 37.5 MG 24 hr tablet   primidone (MYSOLINE) 250 MG tablet   QUEtiapine (SEROQUEL) 25 MG tablet   rosuvastatin (CRESTOR) 20 MG tablet   sildenafil (VIAGRA) 50 MG tablet   spironolactone (ALDACTONE) 25 MG tablet   Suvorexant (BELSOMRA) 10 MG TABS   traZODone (DESYREL) 150 MG tablet   valACYclovir (VALTREX) 1000 MG tablet   Vitamin D, Ergocalciferol, (DRISDOL) 1.25 MG (50000 UNIT) CAPS capsule   No current facility-administered medications for this encounter.   Konrad Felix Ward, PA-C [facility] [phone]

## 2021-11-05 NOTE — Anesthesia Preprocedure Evaluation (Addendum)
Anesthesia Evaluation  Patient identified by MRN, date of birth, ID band Patient awake    Reviewed: Allergy & Precautions, H&P , NPO status , Patient's Chart, lab work & pertinent test results, reviewed documented beta blocker date and time   Airway Mallampati: II  TM Distance: >3 FB Neck ROM: Full    Dental no notable dental hx. (+) Teeth Intact, Dental Advisory Given   Pulmonary neg pulmonary ROS, former smoker,    Pulmonary exam normal breath sounds clear to auscultation       Cardiovascular hypertension, Pt. on medications and Pt. on home beta blockers  Rhythm:Regular Rate:Normal     Neuro/Psych  Headaches, Depression CVA, No Residual Symptoms negative neurological ROS     GI/Hepatic Neg liver ROS, GERD  ,  Endo/Other  negative endocrine ROS  Renal/GU negative Renal ROS  negative genitourinary   Musculoskeletal  (+) Arthritis , Osteoarthritis,    Abdominal   Peds  Hematology negative hematology ROS (+)   Anesthesia Other Findings   Reproductive/Obstetrics negative OB ROS                           Anesthesia Physical Anesthesia Plan  ASA: 3  Anesthesia Plan: Spinal   Post-op Pain Management: Regional block* and Ofirmev IV (intra-op)*   Induction: Intravenous  PONV Risk Score and Plan: 2 and Ondansetron, Propofol infusion and Dexamethasone  Airway Management Planned: Natural Airway and Simple Face Mask  Additional Equipment:   Intra-op Plan:   Post-operative Plan:   Informed Consent: I have reviewed the patients History and Physical, chart, labs and discussed the procedure including the risks, benefits and alternatives for the proposed anesthesia with the patient or authorized representative who has indicated his/her understanding and acceptance.     Dental advisory given  Plan Discussed with: CRNA  Anesthesia Plan Comments: (See PAT note 11/02/2021)        Anesthesia Quick Evaluation

## 2021-11-09 DIAGNOSIS — R0982 Postnasal drip: Secondary | ICD-10-CM | POA: Diagnosis not present

## 2021-11-09 DIAGNOSIS — R7309 Other abnormal glucose: Secondary | ICD-10-CM | POA: Diagnosis not present

## 2021-11-09 DIAGNOSIS — R059 Cough, unspecified: Secondary | ICD-10-CM | POA: Diagnosis not present

## 2021-11-09 DIAGNOSIS — Z6831 Body mass index (BMI) 31.0-31.9, adult: Secondary | ICD-10-CM | POA: Diagnosis not present

## 2021-11-09 DIAGNOSIS — J209 Acute bronchitis, unspecified: Secondary | ICD-10-CM | POA: Diagnosis not present

## 2021-11-10 DIAGNOSIS — M79671 Pain in right foot: Secondary | ICD-10-CM | POA: Diagnosis not present

## 2021-11-10 DIAGNOSIS — M2540 Effusion, unspecified joint: Secondary | ICD-10-CM | POA: Diagnosis not present

## 2021-11-10 DIAGNOSIS — M79672 Pain in left foot: Secondary | ICD-10-CM | POA: Diagnosis not present

## 2021-11-10 DIAGNOSIS — M79642 Pain in left hand: Secondary | ICD-10-CM | POA: Diagnosis not present

## 2021-11-10 DIAGNOSIS — M79641 Pain in right hand: Secondary | ICD-10-CM | POA: Diagnosis not present

## 2021-11-10 DIAGNOSIS — D649 Anemia, unspecified: Secondary | ICD-10-CM | POA: Diagnosis not present

## 2021-11-10 DIAGNOSIS — R059 Cough, unspecified: Secondary | ICD-10-CM | POA: Diagnosis not present

## 2021-11-10 DIAGNOSIS — M069 Rheumatoid arthritis, unspecified: Secondary | ICD-10-CM | POA: Diagnosis not present

## 2021-11-10 DIAGNOSIS — Z6829 Body mass index (BMI) 29.0-29.9, adult: Secondary | ICD-10-CM | POA: Diagnosis not present

## 2021-11-10 DIAGNOSIS — E663 Overweight: Secondary | ICD-10-CM | POA: Diagnosis not present

## 2021-11-15 ENCOUNTER — Encounter (HOSPITAL_COMMUNITY): Payer: Self-pay | Admitting: Orthopedic Surgery

## 2021-11-15 ENCOUNTER — Encounter (HOSPITAL_COMMUNITY)
Admission: RE | Disposition: A | Discharge status: Skilled Nursing Facility | Payer: Self-pay | Source: Home / Self Care | Attending: Orthopedic Surgery

## 2021-11-15 ENCOUNTER — Other Ambulatory Visit: Payer: Self-pay

## 2021-11-15 ENCOUNTER — Ambulatory Visit (HOSPITAL_BASED_OUTPATIENT_CLINIC_OR_DEPARTMENT_OTHER): Payer: Medicare Other | Admitting: Registered Nurse

## 2021-11-15 ENCOUNTER — Inpatient Hospital Stay (HOSPITAL_COMMUNITY)
Admission: RE | Admit: 2021-11-15 | Discharge: 2021-11-19 | DRG: 470 | Disposition: A | Discharge status: Skilled Nursing Facility | Payer: Medicare Other | Attending: Orthopedic Surgery | Admitting: Orthopedic Surgery

## 2021-11-15 ENCOUNTER — Ambulatory Visit (HOSPITAL_COMMUNITY): Payer: Medicare Other | Admitting: Physician Assistant

## 2021-11-15 DIAGNOSIS — I110 Hypertensive heart disease with heart failure: Secondary | ICD-10-CM | POA: Diagnosis present

## 2021-11-15 DIAGNOSIS — R413 Other amnesia: Secondary | ICD-10-CM | POA: Diagnosis present

## 2021-11-15 DIAGNOSIS — Z981 Arthrodesis status: Secondary | ICD-10-CM

## 2021-11-15 DIAGNOSIS — Z6830 Body mass index (BMI) 30.0-30.9, adult: Secondary | ICD-10-CM | POA: Diagnosis not present

## 2021-11-15 DIAGNOSIS — Z9852 Vasectomy status: Secondary | ICD-10-CM | POA: Diagnosis not present

## 2021-11-15 DIAGNOSIS — M179 Osteoarthritis of knee, unspecified: Secondary | ICD-10-CM

## 2021-11-15 DIAGNOSIS — E782 Mixed hyperlipidemia: Secondary | ICD-10-CM | POA: Diagnosis present

## 2021-11-15 DIAGNOSIS — Z7902 Long term (current) use of antithrombotics/antiplatelets: Secondary | ICD-10-CM

## 2021-11-15 DIAGNOSIS — Z8249 Family history of ischemic heart disease and other diseases of the circulatory system: Secondary | ICD-10-CM

## 2021-11-15 DIAGNOSIS — E669 Obesity, unspecified: Secondary | ICD-10-CM | POA: Diagnosis present

## 2021-11-15 DIAGNOSIS — I5032 Chronic diastolic (congestive) heart failure: Secondary | ICD-10-CM | POA: Diagnosis present

## 2021-11-15 DIAGNOSIS — Z888 Allergy status to other drugs, medicaments and biological substances status: Secondary | ICD-10-CM | POA: Diagnosis not present

## 2021-11-15 DIAGNOSIS — F331 Major depressive disorder, recurrent, moderate: Secondary | ICD-10-CM | POA: Diagnosis not present

## 2021-11-15 DIAGNOSIS — Z96651 Presence of right artificial knee joint: Secondary | ICD-10-CM | POA: Diagnosis not present

## 2021-11-15 DIAGNOSIS — Z8673 Personal history of transient ischemic attack (TIA), and cerebral infarction without residual deficits: Secondary | ICD-10-CM

## 2021-11-15 DIAGNOSIS — Z9049 Acquired absence of other specified parts of digestive tract: Secondary | ICD-10-CM

## 2021-11-15 DIAGNOSIS — Z7401 Bed confinement status: Secondary | ICD-10-CM | POA: Diagnosis not present

## 2021-11-15 DIAGNOSIS — R531 Weakness: Secondary | ICD-10-CM | POA: Diagnosis not present

## 2021-11-15 DIAGNOSIS — R296 Repeated falls: Secondary | ICD-10-CM | POA: Diagnosis present

## 2021-11-15 DIAGNOSIS — F3342 Major depressive disorder, recurrent, in full remission: Secondary | ICD-10-CM | POA: Diagnosis not present

## 2021-11-15 DIAGNOSIS — I10 Essential (primary) hypertension: Secondary | ICD-10-CM | POA: Diagnosis not present

## 2021-11-15 DIAGNOSIS — Z885 Allergy status to narcotic agent status: Secondary | ICD-10-CM | POA: Diagnosis not present

## 2021-11-15 DIAGNOSIS — G8918 Other acute postprocedural pain: Secondary | ICD-10-CM | POA: Diagnosis not present

## 2021-11-15 DIAGNOSIS — Z79899 Other long term (current) drug therapy: Secondary | ICD-10-CM | POA: Diagnosis not present

## 2021-11-15 DIAGNOSIS — Z87891 Personal history of nicotine dependence: Secondary | ICD-10-CM

## 2021-11-15 DIAGNOSIS — M62838 Other muscle spasm: Secondary | ICD-10-CM | POA: Diagnosis not present

## 2021-11-15 DIAGNOSIS — G43909 Migraine, unspecified, not intractable, without status migrainosus: Secondary | ICD-10-CM | POA: Diagnosis not present

## 2021-11-15 DIAGNOSIS — K219 Gastro-esophageal reflux disease without esophagitis: Secondary | ICD-10-CM | POA: Diagnosis present

## 2021-11-15 DIAGNOSIS — R251 Tremor, unspecified: Secondary | ICD-10-CM | POA: Diagnosis not present

## 2021-11-15 DIAGNOSIS — W19XXXA Unspecified fall, initial encounter: Secondary | ICD-10-CM | POA: Diagnosis not present

## 2021-11-15 DIAGNOSIS — Z7901 Long term (current) use of anticoagulants: Secondary | ICD-10-CM | POA: Diagnosis not present

## 2021-11-15 DIAGNOSIS — M1711 Unilateral primary osteoarthritis, right knee: Secondary | ICD-10-CM

## 2021-11-15 DIAGNOSIS — M21061 Valgus deformity, not elsewhere classified, right knee: Secondary | ICD-10-CM | POA: Diagnosis present

## 2021-11-15 DIAGNOSIS — F411 Generalized anxiety disorder: Secondary | ICD-10-CM | POA: Diagnosis not present

## 2021-11-15 DIAGNOSIS — Z886 Allergy status to analgesic agent status: Secondary | ICD-10-CM

## 2021-11-15 DIAGNOSIS — B009 Herpesviral infection, unspecified: Secondary | ICD-10-CM | POA: Diagnosis not present

## 2021-11-15 DIAGNOSIS — Z471 Aftercare following joint replacement surgery: Secondary | ICD-10-CM | POA: Diagnosis not present

## 2021-11-15 DIAGNOSIS — Z91014 Allergy to mammalian meats: Secondary | ICD-10-CM

## 2021-11-15 DIAGNOSIS — M25561 Pain in right knee: Secondary | ICD-10-CM | POA: Diagnosis not present

## 2021-11-15 HISTORY — PX: TOTAL KNEE ARTHROPLASTY: SHX125

## 2021-11-15 SURGERY — ARTHROPLASTY, KNEE, TOTAL
Anesthesia: Spinal | Site: Knee | Laterality: Right

## 2021-11-15 MED ORDER — ENOXAPARIN SODIUM 30 MG/0.3ML IJ SOSY: 30.0000 mg | PREFILLED_SYRINGE | Freq: Two times a day (BID) | INTRAMUSCULAR | Status: DC

## 2021-11-15 MED ORDER — FLEET ENEMA 7-19 GM/118ML RE ENEM: 1.0000 | ENEMA | Freq: Once | RECTAL | Status: DC | PRN

## 2021-11-15 MED ORDER — DIPHENHYDRAMINE HCL 12.5 MG/5ML PO ELIX
12.5000 mg | ORAL_SOLUTION | ORAL | Status: DC | PRN
  Administered 2021-11-15: 25 mg via ORAL
  Filled 2021-11-15: qty 10

## 2021-11-15 MED ORDER — SODIUM CHLORIDE 0.9 % IV SOLN: INTRAVENOUS | Status: DC

## 2021-11-15 MED ORDER — QUETIAPINE FUMARATE 25 MG PO TABS
75.0000 mg | ORAL_TABLET | Freq: Every day | ORAL | Status: DC
  Administered 2021-11-15 – 2021-11-19 (×4): 75 mg via ORAL
  Filled 2021-11-15 (×4): qty 3

## 2021-11-15 MED ORDER — FUROSEMIDE 40 MG PO TABS
40.0000 mg | ORAL_TABLET | Freq: Two times a day (BID) | ORAL | Status: DC
  Administered 2021-11-16 (×2): 40 mg via ORAL
  Filled 2021-11-15 (×2): qty 1

## 2021-11-15 MED ORDER — DOCUSATE SODIUM 100 MG PO CAPS
100.0000 mg | ORAL_CAPSULE | Freq: Two times a day (BID) | ORAL | Status: DC
  Administered 2021-11-15 – 2021-11-19 (×8): 100 mg via ORAL
  Filled 2021-11-15 (×8): qty 1

## 2021-11-15 MED ORDER — PROPOFOL 1000 MG/100ML IV EMUL
INTRAVENOUS | Status: AC
  Filled 2021-11-15: qty 100

## 2021-11-15 MED ORDER — SODIUM CHLORIDE (PF) 0.9 % IJ SOLN
INTRAMUSCULAR | Status: AC
  Filled 2021-11-15: qty 50

## 2021-11-15 MED ORDER — PAROXETINE HCL ER 25 MG PO TB24
37.5000 mg | ORAL_TABLET | Freq: Every day | ORAL | Status: DC
  Administered 2021-11-15 – 2021-11-19 (×4): 37.5 mg via ORAL
  Filled 2021-11-15 (×6): qty 1

## 2021-11-15 MED ORDER — ONDANSETRON HCL 4 MG/2ML IJ SOLN
INTRAMUSCULAR | Status: AC
  Filled 2021-11-15: qty 2

## 2021-11-15 MED ORDER — METHOCARBAMOL 500 MG PO TABS
500.0000 mg | ORAL_TABLET | Freq: Four times a day (QID) | ORAL | Status: DC | PRN
  Administered 2021-11-15 – 2021-11-19 (×11): 500 mg via ORAL
  Filled 2021-11-15 (×11): qty 1

## 2021-11-15 MED ORDER — BISACODYL 10 MG RE SUPP: 10.0000 mg | Freq: Every day | RECTAL | Status: DC | PRN

## 2021-11-15 MED ORDER — LACTATED RINGERS IV SOLN: INTRAVENOUS | Status: DC

## 2021-11-15 MED ORDER — BUPIVACAINE-EPINEPHRINE (PF) 0.5% -1:200000 IJ SOLN
INTRAMUSCULAR | Status: DC | PRN
  Administered 2021-11-15: 20 mL via PERINEURAL

## 2021-11-15 MED ORDER — POVIDONE-IODINE 10 % EX SWAB
2.0000 | Freq: Once | CUTANEOUS | Status: AC
  Administered 2021-11-15: 2 via TOPICAL

## 2021-11-15 MED ORDER — MIDAZOLAM HCL 2 MG/2ML IJ SOLN
1.0000 mg | INTRAMUSCULAR | Status: DC
  Filled 2021-11-15: qty 2

## 2021-11-15 MED ORDER — SODIUM CHLORIDE (PF) 0.9 % IJ SOLN
INTRAMUSCULAR | Status: AC
  Filled 2021-11-15: qty 10

## 2021-11-15 MED ORDER — RIVAROXABAN 10 MG PO TABS: 10.0000 mg | ORAL_TABLET | Freq: Every day | ORAL | Status: DC

## 2021-11-15 MED ORDER — CHLORHEXIDINE GLUCONATE 0.12 % MT SOLN
15.0000 mL | Freq: Once | OROMUCOSAL | Status: AC
  Administered 2021-11-15: 15 mL via OROMUCOSAL

## 2021-11-15 MED ORDER — HYDROMORPHONE HCL 2 MG PO TABS
1.0000 mg | ORAL_TABLET | ORAL | Status: DC | PRN
  Administered 2021-11-15 – 2021-11-16 (×4): 2 mg via ORAL
  Filled 2021-11-15 (×4): qty 1

## 2021-11-15 MED ORDER — ACETAMINOPHEN 500 MG PO TABS
1000.0000 mg | ORAL_TABLET | Freq: Four times a day (QID) | ORAL | Status: AC
  Administered 2021-11-15 – 2021-11-16 (×4): 1000 mg via ORAL
  Filled 2021-11-15 (×4): qty 2

## 2021-11-15 MED ORDER — BUPIVACAINE IN DEXTROSE 0.75-8.25 % IT SOLN
INTRATHECAL | Status: DC | PRN
  Administered 2021-11-15: 1.6 mL via INTRATHECAL

## 2021-11-15 MED ORDER — PRIMIDONE 250 MG PO TABS
250.0000 mg | ORAL_TABLET | Freq: Two times a day (BID) | ORAL | Status: DC
  Administered 2021-11-15 – 2021-11-19 (×8): 250 mg via ORAL
  Filled 2021-11-15 (×9): qty 1

## 2021-11-15 MED ORDER — ROSUVASTATIN CALCIUM 20 MG PO TABS
20.0000 mg | ORAL_TABLET | Freq: Every day | ORAL | Status: DC
  Administered 2021-11-16 – 2021-11-18 (×3): 20 mg via ORAL
  Filled 2021-11-15 (×4): qty 1

## 2021-11-15 MED ORDER — SODIUM CHLORIDE 0.9 % IR SOLN
Status: DC | PRN
  Administered 2021-11-15 (×2): 1000 mL

## 2021-11-15 MED ORDER — ONDANSETRON HCL 4 MG PO TABS: 4.0000 mg | ORAL_TABLET | Freq: Four times a day (QID) | ORAL | Status: DC | PRN

## 2021-11-15 MED ORDER — PHENOL 1.4 % MT LIQD: 1.0000 | OROMUCOSAL | Status: DC | PRN

## 2021-11-15 MED ORDER — ONDANSETRON HCL 4 MG/2ML IJ SOLN
INTRAMUSCULAR | Status: DC | PRN
  Administered 2021-11-15: 4 mg via INTRAVENOUS

## 2021-11-15 MED ORDER — TRAZODONE HCL 50 MG PO TABS
150.0000 mg | ORAL_TABLET | Freq: Every evening | ORAL | Status: DC | PRN
  Administered 2021-11-15 – 2021-11-17 (×3): 150 mg via ORAL
  Filled 2021-11-15 (×3): qty 1

## 2021-11-15 MED ORDER — BUPIVACAINE LIPOSOME 1.3 % IJ SUSP
INTRAMUSCULAR | Status: DC | PRN
  Administered 2021-11-15: 20 mL

## 2021-11-15 MED ORDER — METOCLOPRAMIDE HCL 5 MG/ML IJ SOLN: 5.0000 mg | Freq: Three times a day (TID) | INTRAMUSCULAR | Status: DC | PRN

## 2021-11-15 MED ORDER — MENTHOL 3 MG MT LOZG: 1.0000 | LOZENGE | OROMUCOSAL | Status: DC | PRN

## 2021-11-15 MED ORDER — FENTANYL CITRATE PF 50 MCG/ML IJ SOSY
50.0000 ug | PREFILLED_SYRINGE | INTRAMUSCULAR | Status: AC
  Administered 2021-11-15: 50 ug via INTRAVENOUS
  Filled 2021-11-15: qty 2

## 2021-11-15 MED ORDER — DEXAMETHASONE SODIUM PHOSPHATE 10 MG/ML IJ SOLN
INTRAMUSCULAR | Status: AC
  Filled 2021-11-15: qty 1

## 2021-11-15 MED ORDER — POLYETHYLENE GLYCOL 3350 17 G PO PACK: 17.0000 g | PACK | Freq: Every day | ORAL | Status: DC | PRN

## 2021-11-15 MED ORDER — CEFAZOLIN SODIUM-DEXTROSE 2-4 GM/100ML-% IV SOLN
2.0000 g | INTRAVENOUS | Status: AC
  Administered 2021-11-15: 2 g via INTRAVENOUS
  Filled 2021-11-15: qty 100

## 2021-11-15 MED ORDER — MORPHINE SULFATE (PF) 2 MG/ML IV SOLN
1.0000 mg | INTRAVENOUS | Status: DC | PRN
  Administered 2021-11-15 (×3): 2 mg via INTRAVENOUS
  Filled 2021-11-15 (×3): qty 1

## 2021-11-15 MED ORDER — CEFAZOLIN SODIUM-DEXTROSE 2-4 GM/100ML-% IV SOLN
2.0000 g | Freq: Four times a day (QID) | INTRAVENOUS | Status: AC
  Administered 2021-11-15 (×2): 2 g via INTRAVENOUS
  Filled 2021-11-15 (×2): qty 100

## 2021-11-15 MED ORDER — BUSPIRONE HCL 10 MG PO TABS
10.0000 mg | ORAL_TABLET | Freq: Every day | ORAL | Status: DC
  Administered 2021-11-16 – 2021-11-18 (×3): 10 mg via ORAL
  Filled 2021-11-15 (×4): qty 1

## 2021-11-15 MED ORDER — FENTANYL CITRATE PF 50 MCG/ML IJ SOSY: 25.0000 ug | PREFILLED_SYRINGE | INTRAMUSCULAR | Status: DC | PRN

## 2021-11-15 MED ORDER — ACETAMINOPHEN 10 MG/ML IV SOLN
1000.0000 mg | Freq: Four times a day (QID) | INTRAVENOUS | Status: DC
  Administered 2021-11-15: 1000 mg via INTRAVENOUS
  Filled 2021-11-15: qty 100

## 2021-11-15 MED ORDER — HYDRALAZINE HCL 50 MG PO TABS
50.0000 mg | ORAL_TABLET | Freq: Two times a day (BID) | ORAL | Status: DC
  Administered 2021-11-15 – 2021-11-19 (×8): 50 mg via ORAL
  Filled 2021-11-15 (×8): qty 1

## 2021-11-15 MED ORDER — DEXAMETHASONE SODIUM PHOSPHATE 10 MG/ML IJ SOLN
10.0000 mg | Freq: Once | INTRAMUSCULAR | Status: AC
  Administered 2021-11-16: 10 mg via INTRAVENOUS
  Filled 2021-11-15: qty 1

## 2021-11-15 MED ORDER — ONDANSETRON HCL 4 MG/2ML IJ SOLN: 4.0000 mg | Freq: Four times a day (QID) | INTRAMUSCULAR | Status: DC | PRN

## 2021-11-15 MED ORDER — STERILE WATER FOR IRRIGATION IR SOLN
Status: DC | PRN
  Administered 2021-11-15: 2000 mL

## 2021-11-15 MED ORDER — METOPROLOL SUCCINATE ER 25 MG PO TB24
25.0000 mg | ORAL_TABLET | Freq: Every day | ORAL | Status: DC
  Administered 2021-11-16 – 2021-11-19 (×4): 25 mg via ORAL
  Filled 2021-11-15 (×4): qty 1

## 2021-11-15 MED ORDER — ORAL CARE MOUTH RINSE: 15.0000 mL | Freq: Once | OROMUCOSAL | Status: AC

## 2021-11-15 MED ORDER — KETOROLAC TROMETHAMINE 15 MG/ML IJ SOLN
15.0000 mg | Freq: Once | INTRAMUSCULAR | Status: AC
  Administered 2021-11-15: 15 mg via INTRAVENOUS
  Filled 2021-11-15: qty 1

## 2021-11-15 MED ORDER — METOCLOPRAMIDE HCL 5 MG PO TABS: 5.0000 mg | ORAL_TABLET | Freq: Three times a day (TID) | ORAL | Status: DC | PRN

## 2021-11-15 MED ORDER — BUPIVACAINE LIPOSOME 1.3 % IJ SUSP: 20.0000 mL | Freq: Once | INTRAMUSCULAR | Status: DC

## 2021-11-15 MED ORDER — AMLODIPINE BESYLATE 5 MG PO TABS
5.0000 mg | ORAL_TABLET | Freq: Every day | ORAL | Status: DC
  Administered 2021-11-16 – 2021-11-19 (×4): 5 mg via ORAL
  Filled 2021-11-15 (×4): qty 1

## 2021-11-15 MED ORDER — SODIUM CHLORIDE (PF) 0.9 % IJ SOLN
INTRAMUSCULAR | Status: DC | PRN
  Administered 2021-11-15: 60 mL

## 2021-11-15 MED ORDER — SPIRONOLACTONE 25 MG PO TABS
25.0000 mg | ORAL_TABLET | Freq: Every day | ORAL | Status: DC
  Administered 2021-11-16 – 2021-11-19 (×4): 25 mg via ORAL
  Filled 2021-11-15 (×4): qty 1

## 2021-11-15 MED ORDER — DEXAMETHASONE SODIUM PHOSPHATE 10 MG/ML IJ SOLN
8.0000 mg | Freq: Once | INTRAMUSCULAR | Status: AC
  Administered 2021-11-15: 8 mg via INTRAVENOUS

## 2021-11-15 MED ORDER — PHENYLEPHRINE HCL-NACL 20-0.9 MG/250ML-% IV SOLN
INTRAVENOUS | Status: DC | PRN
  Administered 2021-11-15: 20 ug/min via INTRAVENOUS

## 2021-11-15 MED ORDER — PROPOFOL 500 MG/50ML IV EMUL
INTRAVENOUS | Status: DC | PRN
  Administered 2021-11-15: 40 ug/kg/min via INTRAVENOUS

## 2021-11-15 MED ORDER — METHOCARBAMOL 500 MG IVPB - SIMPLE MED
500.0000 mg | Freq: Four times a day (QID) | INTRAVENOUS | Status: DC | PRN
  Administered 2021-11-15: 500 mg via INTRAVENOUS
  Filled 2021-11-15: qty 500

## 2021-11-15 MED ORDER — TRANEXAMIC ACID-NACL 1000-0.7 MG/100ML-% IV SOLN
1000.0000 mg | INTRAVENOUS | Status: AC
  Administered 2021-11-15: 1000 mg via INTRAVENOUS
  Filled 2021-11-15: qty 100

## 2021-11-15 MED ORDER — BUPIVACAINE LIPOSOME 1.3 % IJ SUSP
INTRAMUSCULAR | Status: AC
  Filled 2021-11-15: qty 20

## 2021-11-15 MED ORDER — IRBESARTAN 150 MG PO TABS
300.0000 mg | ORAL_TABLET | Freq: Every day | ORAL | Status: DC
  Administered 2021-11-16 – 2021-11-19 (×4): 300 mg via ORAL
  Filled 2021-11-15 (×4): qty 2

## 2021-11-15 SURGICAL SUPPLY — 53 items
ATTUNE PS FEM RT SZ 6 CEM KNEE (Femur) IMPLANT
ATTUNE PSRP INSR SZ6 12 KNEE (Insert) IMPLANT
BAG COUNTER SPONGE SURGICOUNT (BAG) IMPLANT
BAG ZIPLOCK 12X15 (MISCELLANEOUS) ×1 IMPLANT
BASE TIBIAL ROT PLAT SZ 7 KNEE (Knees) IMPLANT
BLADE SAG 18X100X1.27 (BLADE) ×1 IMPLANT
BLADE SAW SGTL 11.0X1.19X90.0M (BLADE) ×1 IMPLANT
BNDG ELASTIC 6X5.8 VLCR STR LF (GAUZE/BANDAGES/DRESSINGS) ×1 IMPLANT
BOWL SMART MIX CTS (DISPOSABLE) ×1 IMPLANT
CEMENT HV SMART SET (Cement) ×2 IMPLANT
CLSR STERI-STRIP ANTIMIC 1/2X4 (GAUZE/BANDAGES/DRESSINGS) IMPLANT
COVER SURGICAL LIGHT HANDLE (MISCELLANEOUS) ×1 IMPLANT
CUFF TOURN SGL QUICK 34 (TOURNIQUET CUFF) ×1
CUFF TRNQT CYL 34X4.125X (TOURNIQUET CUFF) ×1 IMPLANT
DRAPE INCISE IOBAN 66X45 STRL (DRAPES) ×1 IMPLANT
DRAPE U-SHAPE 47X51 STRL (DRAPES) ×1 IMPLANT
DRSG AQUACEL AG ADV 3.5X10 (GAUZE/BANDAGES/DRESSINGS) ×1 IMPLANT
DURAPREP 26ML APPLICATOR (WOUND CARE) ×1 IMPLANT
ELECT REM PT RETURN 15FT ADLT (MISCELLANEOUS) ×1 IMPLANT
GLOVE BIO SURGEON STRL SZ 6.5 (GLOVE) IMPLANT
GLOVE BIO SURGEON STRL SZ7.5 (GLOVE) IMPLANT
GLOVE BIO SURGEON STRL SZ8 (GLOVE) ×1 IMPLANT
GLOVE BIOGEL PI IND STRL 6.5 (GLOVE) IMPLANT
GLOVE BIOGEL PI IND STRL 7.0 (GLOVE) IMPLANT
GLOVE BIOGEL PI IND STRL 8 (GLOVE) ×1 IMPLANT
GOWN STRL REUS W/ TWL LRG LVL3 (GOWN DISPOSABLE) ×1 IMPLANT
GOWN STRL REUS W/ TWL XL LVL3 (GOWN DISPOSABLE) IMPLANT
GOWN STRL REUS W/TWL LRG LVL3 (GOWN DISPOSABLE) ×1
GOWN STRL REUS W/TWL XL LVL3 (GOWN DISPOSABLE)
HANDPIECE INTERPULSE COAX TIP (DISPOSABLE) ×1
HOLDER FOLEY CATH W/STRAP (MISCELLANEOUS) IMPLANT
IMMOBILIZER KNEE 20 (SOFTGOODS) ×1
IMMOBILIZER KNEE 20 THIGH 36 (SOFTGOODS) ×1 IMPLANT
KIT TURNOVER KIT A (KITS) IMPLANT
MANIFOLD NEPTUNE II (INSTRUMENTS) ×1 IMPLANT
NS IRRIG 1000ML POUR BTL (IV SOLUTION) ×1 IMPLANT
PACK TOTAL KNEE CUSTOM (KITS) ×1 IMPLANT
PADDING CAST COTTON 6X4 STRL (CAST SUPPLIES) ×2 IMPLANT
PATELLA MEDIAL ATTUN 35MM KNEE (Knees) IMPLANT
PROTECTOR NERVE ULNAR (MISCELLANEOUS) ×1 IMPLANT
SET HNDPC FAN SPRY TIP SCT (DISPOSABLE) ×1 IMPLANT
SPIKE FLUID TRANSFER (MISCELLANEOUS) ×1 IMPLANT
STRIP CLOSURE SKIN 1/2X4 (GAUZE/BANDAGES/DRESSINGS) ×2 IMPLANT
SUT MNCRL AB 4-0 PS2 18 (SUTURE) ×1 IMPLANT
SUT STRATAFIX 0 PDS 27 VIOLET (SUTURE) ×1
SUT VIC AB 2-0 CT1 27 (SUTURE) ×3
SUT VIC AB 2-0 CT1 TAPERPNT 27 (SUTURE) ×3 IMPLANT
SUTURE STRATFX 0 PDS 27 VIOLET (SUTURE) ×1 IMPLANT
TIBIAL BASE ROT PLAT SZ 7 KNEE (Knees) ×1 IMPLANT
TRAY FOLEY MTR SLVR 16FR STAT (SET/KITS/TRAYS/PACK) ×1 IMPLANT
TUBE SUCTION HIGH CAP CLEAR NV (SUCTIONS) ×1 IMPLANT
WATER STERILE IRR 1000ML POUR (IV SOLUTION) ×2 IMPLANT
WRAP KNEE MAXI GEL POST OP (GAUZE/BANDAGES/DRESSINGS) ×1 IMPLANT

## 2021-11-15 NOTE — Op Note (Signed)
OPERATIVE REPORT-TOTAL Leon ARTHROPLASTY   Pre-operative diagnosis- Osteoarthritis  Right Leon(s)  Post-operative diagnosis- Osteoarthritis Right Leon(s)  Procedure-  Right  Total Leon Arthroplasty  Surgeon- Dione Plover. Bensyn Bornemann, MD  Assistant- Theresa Duty, PA-C   Anesthesia-   Adductor canal block and spinal  EBL-50 mL   Drains None  Tourniquet time-  Total Tourniquet Time Documented: Thigh (Right) - 35 minutes Total: Thigh (Right) - 35 minutes     Complications- None  Condition-PACU - hemodynamically stable.   Brief Clinical Note  [name] is a 71 y.o. year old male with end stage OA of his right Leon with progressively worsening pain and dysfunction. He has constant pain, with activity and at rest and significant functional deficits with difficulties even with ADLs. He has had extensive non-op management including analgesics, injections of cortisone and viscosupplements, and home exercise program, but remains in significant pain with significant dysfunction. Radiographs show bone on bone arthritis bone on bone lateral and patellofemoral with valgus deformity. He presents now for right Total Leon Arthroplasty.     Procedure in detail---   The patient is brought into the operating room and positioned supine on the operating table. After successful administration of  Adductor canal block and spinal,   a tourniquet is placed high on the  Right thigh(s) and the lower extremity is prepped and draped in the usual sterile fashion. Time out is performed by the operating team and then the  Right lower extremity is wrapped in Esmarch, Leon flexed and the tourniquet inflated to 300 mmHg.       A midline incision is made with a ten blade through the subcutaneous tissue to the level of the extensor mechanism. A fresh blade is used to make a medial parapatellar arthrotomy. Soft tissue over the proximal medial tibia is subperiosteally elevated to the joint line with a knife and into  the semimembranosus bursa with a Cobb elevator. Soft tissue over the proximal lateral tibia is elevated with attention being paid to avoiding the patellar tendon on the tibial tubercle. The patella is everted, Leon flexed 90 degrees and the ACL and PCL are removed. Findings are bone on bone lateral and patellofemoral with valgus deformity        The drill is used to create a starting hole in the distal femur and the canal is thoroughly irrigated with sterile saline to remove the fatty contents. The 5 degree Left  valgus alignment guide is placed into the femoral canal and the distal femoral cutting block is pinned to remove 9 mm off the distal femur. Resection is made with an oscillating saw.      The tibia is subluxed forward and the menisci are removed. The extramedullary alignment guide is placed referencing proximally at the medial aspect of the tibial tubercle and distally along the second metatarsal axis and tibial crest. The block is pinned to remove 66m off the more deficient lateral  side. Resection is made with an oscillating saw. Size 7is the most appropriate size for the tibia and the proximal tibia is prepared with the modular drill and keel punch for that size.      The femoral sizing guide is placed and size 6 is most appropriate. Rotation is marked off the epicondylar axis and confirmed by creating a rectangular flexion gap at 90 degrees. The size 6 cutting block is pinned in this rotation and the anterior, posterior and chamfer cuts are made with the oscillating saw. The intercondylar block is then  placed and that cut is made.      Trial size 7 tibial component, trial size 6 posterior stabilized femur and a 12  mm posterior stabilized rotating platform insert trial is placed. Full extension is achieved with excellent varus/valgus and anterior/posterior balance throughout full range of motion. The patella is everted and thickness measured to be 25  mm. Free hand resection is taken to 15 mm, a 35  template is placed, lug holes are drilled, trial patella is placed, and it tracks normally. Osteophytes are removed off the posterior femur with the trial in place. All trials are removed and the cut bone surfaces prepared with pulsatile lavage. Cement is mixed and once ready for implantation, the size 7 tibial implant, size  6 posterior stabilized femoral component, and the size 35 patella are cemented in place and the patella is held with the clamp. The trial insert is placed and the Leon held in full extension. The Exparel (20 ml mixed with 60 ml saline) is injected into the extensor mechanism, posterior capsule, medial and lateral gutters and subcutaneous tissues.  All extruded cement is removed and once the cement is hard the permanent 12 mm posterior stabilized rotating platform insert is placed into the tibial tray.      The wound is copiously irrigated with saline solution and the extensor mechanism closed with # 0 Stratofix suture. The tourniquet is released for a total tourniquet time of 35  minutes. Flexion against gravity is 140 degrees and the patella tracks normally. Subcutaneous tissue is closed with 2.0 vicryl and subcuticular with running 4.0 Monocryl. The incision is cleaned and dried and steri-strips and a bulky sterile dressing are applied. The limb is placed into a Leon immobilizer and the patient is awakened and transported to recovery in stable condition.      Please note that a surgical assistant was a medical necessity for this procedure in order to perform it in a safe and expeditious manner. Surgical assistant was necessary to retract the ligaments and vital neurovascular structures to prevent injury to them and also necessary for proper positioning of the limb to allow for anatomic placement of the prosthesis.   Dione Plover Noelia Lenart, MD    11/15/2021, 12:25 PM

## 2021-11-15 NOTE — Progress Notes (Signed)
Orthopedic Tech Progress Note Patient Details:  [name] May 11, 1950 [id number]  Went in to take the pt off the CPM after they took a break and restarted it after supper. The pt stated of feeling pain and it was noted that his leg kept trying to turn  to the side. Nursing was notified of the pain and the position of the leg and was taken off the CPM.    Patient ID: [name], male   DOB: 1950/08/06, 71 y.o.   MRN: [id number]  [name] 11/15/2021, 7:26 PM

## 2021-11-15 NOTE — Progress Notes (Signed)
Orthopedic Tech Progress Note Patient Details:  KADIEN LINEMAN 01/02/1951 [id number]  CPM was placed on to the pt. Pt stated they did not feel any pain or discomfort.  CPM Right Knee CPM Right Knee: On Right Knee Flexion (Degrees): 40 Right Knee Extension (Degrees): 10  Post Interventions Patient Tolerated: Well Instructions Provided: Adjustment of device, Care of device  Arville Go 11/15/2021, 1:55 PM

## 2021-11-15 NOTE — Interval H&P Note (Signed)
History and Physical Interval Note:  11/15/2021 9:18 AM  [name]  has presented today for surgery, with the diagnosis of right knee osteoarthritis.  The various methods of treatment have been discussed with the patient and family. After consideration of risks, benefits and other options for treatment, the patient has consented to  Procedure(s): TOTAL KNEE ARTHROPLASTY (Right) as a surgical intervention.  The patient's history has been reviewed, patient examined, no change in status, stable for surgery.  I have reviewed the patient's chart and labs.  Questions were answered to the patient's satisfaction.     Pilar Plate Arno Cullers

## 2021-11-15 NOTE — Care Plan (Signed)
Ortho Bundle Case Management Note  Patient Details  Name: HEKTOR HUSTON MRN: [id number] Date of Birth: 1950/02/19                  R TKA on 11-15-21 DCP: [facility]. OK per MD. DME: No needs, has a RW   DME Arranged:  N/A DME Agency:     [facility] Arranged:    [place] Agency:     Additional Comments: Please contact me with any questions of if this plan should need to change.  Marianne Sofia, RN,CCM [facility]  [phone] 11/15/2021, 4:16 PM

## 2021-11-15 NOTE — Transfer of Care (Signed)
Immediate Anesthesia Transfer of Care Note  Patient: [name]  Procedure(s) Performed: TOTAL KNEE ARTHROPLASTY (Right: Knee)  Patient Location: PACU  Anesthesia Type:MAC and Spinal  Level of Consciousness: awake, alert , oriented and patient cooperative  Airway & Oxygen Therapy: Patient Spontanous Breathing and Patient connected to face mask oxygen  Post-op Assessment: Report given to RN and Post -op Vital signs reviewed and stable  Post vital signs: Reviewed and stable  Last Vitals:  Vitals Value Taken Time  BP 102/63 11/15/21 1249  Temp    Pulse 65 11/15/21 1254  Resp 19 11/15/21 1254  SpO2 98 % 11/15/21 1254  Vitals shown include unvalidated device data.  Last Pain:  Vitals:   11/15/21 0944  TempSrc:   PainSc: 8       Patients Stated Pain Goal: 4 (48/27/07 8675)  Complications: No notable events documented.

## 2021-11-15 NOTE — Anesthesia Postprocedure Evaluation (Signed)
Anesthesia Post Note  Patient: [name]  Procedure(s) Performed: TOTAL KNEE ARTHROPLASTY (Right: Knee)     Patient location during evaluation: PACU Anesthesia Type: Spinal and Regional Level of consciousness: oriented and awake and alert Pain management: pain level controlled Vital Signs Assessment: post-procedure vital signs reviewed and stable Respiratory status: spontaneous breathing and respiratory function stable Cardiovascular status: blood pressure returned to baseline and stable Postop Assessment: no headache, no backache, no apparent nausea or vomiting, spinal receding and patient able to bend at knees Anesthetic complications: no   No notable events documented.  Last Vitals:  Vitals:   11/15/21 1400 11/15/21 1413  BP: 126/64 126/88  Pulse:  (!) 56  Resp:  16  Temp:  36.5 C  SpO2:  96%    Last Pain:  Vitals:   11/15/21 1413  TempSrc: Oral  PainSc:                  [name],W. EDMOND

## 2021-11-15 NOTE — Discharge Instructions (Addendum)
Gaynelle Arabian, MD Total Joint Specialist [facility] [address] [place], [place] [address] [phone]  TOTAL KNEE REPLACEMENT POSTOPERATIVE DIRECTIONS    Knee Rehabilitation, Guidelines Following Surgery  Results after knee surgery are often greatly improved when you follow the exercise, range of motion and muscle strengthening exercises prescribed by your doctor. Safety measures are also important to protect the knee from further injury. If any of these exercises cause you to have increased pain or swelling in your knee joint, decrease the amount until you are comfortable again and slowly increase them. If you have problems or questions, call your caregiver or physical therapist for advice.   BLOOD CLOT PREVENTION Inject Arixtra once a day for three weeks following surgery. Then resume usual 75 mg plavix daily  HOME CARE INSTRUCTIONS  Remove items at home which could result in a fall. This includes throw rugs or furniture in walking pathways.  ICE to the affected knee as much as tolerated. Icing helps control swelling. If the swelling is well controlled you will be more comfortable and rehab easier. Continue to use ice on the knee for pain and swelling from surgery. You may notice swelling that will progress down to the foot and ankle. This is normal after surgery. Elevate the leg when you are not up walking on it.    Continue to use the breathing machine which will help keep your temperature down. It is common for your temperature to cycle up and down following surgery, especially at night when you are not up moving around and exerting yourself. The breathing machine keeps your lungs expanded and your temperature down. Do not place pillow under the operative knee, focus on keeping the knee straight while resting  DIET You may resume your previous home diet once you are discharged from the hospital.  DRESSING / WOUND CARE / SHOWERING Keep your bulky  bandage on for 2 days. On the third post-operative day you may remove the Ace bandage and gauze. There is a waterproof adhesive bandage on your skin which will stay in place until your first follow-up appointment. Once you remove this you will not need to place another bandage You may begin showering 3 days following surgery, but do not submerge the incision under water.  ACTIVITY For the first 5 days, the key is rest and control of pain and swelling Do your home exercises twice a day starting on post-operative day 3. On the days you go to physical therapy, just do the home exercises once that day. You should rest, ice and elevate the leg for 50 minutes out of every hour. Get up and walk/stretch for 10 minutes per hour. After 5 days you can increase your activity slowly as tolerated. Walk with your walker as instructed. Use the walker until you are comfortable transitioning to a cane. Walk with the cane in the opposite hand of the operative leg. You may discontinue the cane once you are comfortable and walking steadily. Avoid periods of inactivity such as sitting longer than an hour when not asleep. This helps prevent blood clots.  You may discontinue the knee immobilizer once you are able to perform a straight leg raise while lying down. You may resume a sexual relationship in one month or when given the OK by your doctor.  You may return to work once you are cleared by your doctor.  Do not drive a car for 6 weeks or until released by your surgeon.  Do not drive  while taking narcotics.  TED HOSE STOCKINGS Wear the elastic stockings on both legs for three weeks following surgery during the day. You may remove them at night for sleeping.  WEIGHT BEARING Weight bearing as tolerated with assist device (walker, cane, etc) as directed, use it as long as suggested by your surgeon or therapist, typically at least 4-6 weeks.  POSTOPERATIVE CONSTIPATION PROTOCOL Constipation - defined medically as fewer  than three stools per week and severe constipation as less than one stool per week.  One of the most common issues patients have following surgery is constipation.  Even if you have a regular bowel pattern at home, your normal regimen is likely to be disrupted due to multiple reasons following surgery.  Combination of anesthesia, postoperative narcotics, change in appetite and fluid intake all can affect your bowels.  In order to avoid complications following surgery, here are some recommendations in order to help you during your recovery period.  Colace (docusate) - Pick up an over-the-counter form of Colace or another stool softener and take twice a day as long as you are requiring postoperative pain medications.  Take with a full glass of water daily.  If you experience loose stools or diarrhea, hold the colace until you stool forms back up. If your symptoms do not get better within 1 week or if they get worse, check with your doctor. Dulcolax (bisacodyl) - Pick up over-the-counter and take as directed by the product packaging as needed to assist with the movement of your bowels.  Take with a full glass of water.  Use this product as needed if not relieved by Colace only.  MiraLax (polyethylene glycol) - Pick up over-the-counter to have on hand. MiraLax is a solution that will increase the amount of water in your bowels to assist with bowel movements.  Take as directed and can mix with a glass of water, juice, soda, coffee, or tea. Take if you go more than two days without a movement. Do not use MiraLax more than once per day. Call your doctor if you are still constipated or irregular after using this medication for 7 days in a row.  If you continue to have problems with postoperative constipation, please contact the office for further assistance and recommendations.  If you experience "the worst abdominal pain ever" or develop nausea or vomiting, please contact the office immediatly for further  recommendations for treatment.  ITCHING If you experience itching with your medications, try taking only a single pain pill, or even half a pain pill at a time.  You can also use Benadryl over the counter for itching or also to help with sleep.   MEDICATIONS See your medication summary on the "After Visit Summary" that the nursing staff will review with you prior to discharge.  You may have some home medications which will be placed on hold until you complete the course of blood thinner medication.  It is important for you to complete the blood thinner medication as prescribed by your surgeon.  Continue your approved medications as instructed at time of discharge.  PRECAUTIONS If you experience chest pain or shortness of breath - call 911 immediately for transfer to the hospital emergency department.  If you develop a fever greater that 101 F, purulent drainage from wound, increased redness or drainage from wound, foul odor from the wound/dressing, or calf pain - CONTACT YOUR SURGEON.  FOLLOW-UP APPOINTMENTS Make sure you keep all of your appointments after your operation with your surgeon and caregivers. You should call the office at the above phone number and make an appointment for approximately two weeks after the date of your surgery or on the date instructed by your surgeon outlined in the "After Visit Summary".  RANGE OF MOTION AND STRENGTHENING EXERCISES  Rehabilitation of the knee is important following a knee injury or an operation. After just a few days of immobilization, the muscles of the thigh which control the knee become weakened and shrink (atrophy). Knee exercises are designed to build up the tone and strength of the thigh muscles and to improve knee motion. Often times heat used for twenty to thirty minutes before working out will loosen up your tissues and help with improving the range of motion but do not use heat for the first two  weeks following surgery. These exercises can be done on a training (exercise) mat, on the floor, on a table or on a bed. Use what ever works the best and is most comfortable for you Knee exercises include:  Leg Lifts - While your knee is still immobilized in a splint or cast, you can do straight leg raises. Lift the leg to 60 degrees, hold for 3 sec, and slowly lower the leg. Repeat 10-20 times 2-3 times daily. Perform this exercise against resistance later as your knee gets better.  Quad and Hamstring Sets - Tighten up the muscle on the front of the thigh (Quad) and hold for 5-10 sec. Repeat this 10-20 times hourly. Hamstring sets are done by pushing the foot backward against an object and holding for 5-10 sec. Repeat as with quad sets.  Leg Slides: Lying on your back, slowly slide your foot toward your buttocks, bending your knee up off the floor (only go as far as is comfortable). Then slowly slide your foot back down until your leg is flat on the floor again. Angel Wings: Lying on your back spread your legs to the side as far apart as you can without causing discomfort.  A rehabilitation program following serious knee injuries can speed recovery and prevent re-injury in the future due to weakened muscles. Contact your doctor or a physical therapist for more information on knee rehabilitation.   POST-OPERATIVE OPIOID TAPER INSTRUCTIONS: It is important to wean off of your opioid medication as soon as possible. If you do not need pain medication after your surgery it is ok to stop day one. Opioids include: Codeine, Hydrocodone(Norco, Vicodin), Oxycodone(Percocet, oxycontin) and hydromorphone amongst others.  Long term and even short term use of opiods can cause: Increased pain response Dependence Constipation Depression Respiratory depression And more.  Withdrawal symptoms can include Flu like symptoms Nausea, vomiting And more Techniques to manage these symptoms Hydrate well Eat regular  healthy meals Stay active Use relaxation techniques(deep breathing, meditating, yoga) Do Not substitute Alcohol to help with tapering If you have been on opioids for less than two weeks and do not have pain than it is ok to stop all together.  Plan to wean off of opioids This plan should start within one week post op of your joint replacement. Maintain the same interval or time between taking each dose and first decrease the dose.  Cut the total daily intake of opioids by one tablet each day Next start to increase the time between doses. The last dose that should be eliminated is the evening dose.   IF YOU ARE TRANSFERRED TO  A SKILLED REHAB FACILITY If the patient is transferred to a skilled rehab facility following release from the hospital, a list of the current medications will be sent to the facility for the patient to continue.  When discharged from the skilled rehab facility, please have the facility set up the patient's [place] prior to being released. Also, the skilled facility will be responsible for providing the patient with their medications at time of release from the facility to include their pain medication, the muscle relaxants, and their blood thinner medication. If the patient is still at the rehab facility at time of the two week follow up appointment, the skilled rehab facility will also need to assist the patient in arranging follow up appointment in our office and any transportation needs.  MAKE SURE YOU:  Understand these instructions.  Get help right away if you are not doing well or get worse.   DENTAL ANTIBIOTICS:  In most cases prophylactic antibiotics for Dental procdeures after total joint surgery are not necessary.  Exceptions are as follows:  1. History of prior total joint infection  2. Severely immunocompromised (Organ Transplant, cancer chemotherapy, Rheumatoid biologic meds such as [place])  3. Poorly controlled diabetes (A1C &gt;  8.0, blood glucose over 200)  If you have one of these conditions, contact your surgeon for an antibiotic prescription, prior to your dental procedure.    Pick up stool softner and laxative for home use following surgery while on pain medications. Do not submerge incision under water. Please use good hand washing techniques while changing dressing each day. May shower starting three days after surgery. Please use a clean towel to pat the incision dry following showers. Continue to use ice for pain and swelling after surgery. Do not use any lotions or creams on the incision until instructed by your surgeon.

## 2021-11-15 NOTE — Anesthesia Procedure Notes (Signed)
Anesthesia Regional Block: Adductor canal block   Pre-Anesthetic Checklist: , timeout performed,  Correct Patient, Correct Site, Correct Laterality,  Correct Procedure, Correct Position, site marked,  Risks and benefits discussed,  Pre-op evaluation,  At surgeon's request and post-op pain management  Laterality: Right  Prep: Maximum Sterile Barrier Precautions used, chloraprep       Needles:  Injection technique: Single-shot  Needle Type: Echogenic Stimulator Needle     Needle Length: 9cm  Needle Gauge: 21     Additional Needles:   Procedures:,,,, ultrasound used (permanent image in chart),,    Narrative:  Start time: 11/15/2021 10:19 AM End time: 11/15/2021 10:29 AM Injection made incrementally with aspirations every 5 mL.  Performed by: Personally  Anesthesiologist: Roderic Palau, MD  Additional Notes: 2% Lidocaine skin wheel.

## 2021-11-15 NOTE — Anesthesia Procedure Notes (Signed)
Spinal  Start time: 11/15/2021 11:12 AM End time: 11/15/2021 11:17 AM Reason for block: surgical anesthesia Staffing Resident/CRNA: Victoriano Lain, CRNA Performed by: Victoriano Lain, CRNA Authorized by: Roderic Palau, MD   Preanesthetic Checklist Completed: patient identified, IV checked, site marked, risks and benefits discussed, surgical consent, monitors and equipment checked, pre-op evaluation and timeout performed Spinal Block Patient position: sitting Prep: DuraPrep and site prepped and draped Patient monitoring: heart rate, cardiac monitor, continuous pulse ox and blood pressure Approach: midline Location: L3-4 Injection technique: single-shot Needle Needle type: Pencan  Needle gauge: 24 G Needle length: 10 cm Assessment Sensory level: T4 Events: CSF return Additional Notes Pt placed on  monitors and O2 via FM. Sedation for pt comfort. Pt in sitting position. Spinal kit expiration date checked and verified. Sterile prep and drape of back. Local injected to site. One attempt by CRNA. + clear CSF, - heme. Pt tolerated well. VSS. Pt placed supine after spinal placement

## 2021-11-15 NOTE — Evaluation (Addendum)
Physical Therapy Evaluation Patient Details Name: [name] MRN: [id number] DOB: 06-01-1950 Today's Date: 11/15/2021  History of Present Illness  Pt is a 71yo male presenting s/p R-TKA s/p R-TKA on 11/15/21. PMH: CHF, OA, depression, GERD, herpes, HLD, HTN, memory loss, hx of stroke, AKI, depression, tardive dyskinesia,   Clinical Impression  [name] is a 71 y.o. male POD 0 s/p R-TKA. Patient reports independence with mobility at baseline though does have a rollator. Pt lives in [place] apartment at [facility]. Patient is now limited by functional impairments (see PT problem list below) and requires min guard for bed mobility and for transfers. Patient was able to ambulate 20 feet with RW and min guard level of assist. Pt became mildly lightheaded during mobility, BP 137/83 HR62 SpO2 98%. Patient instructed in exercise to facilitate ROM and circulation to manage edema. Provided incentive spirometer and with Vcs pt able to achieve 2331m. Patient will benefit from continued skilled PT interventions to address impairments and progress towards PLOF. Acute PT will follow to progress mobility and stair training in preparation for safe discharge to SNF-level therapies; pt and daughter have selected [name]       Recommendations for follow up therapy are one component of a multi-disciplinary discharge planning process, led by the attending physician.  Recommendations may be updated based on patient status, additional functional criteria and insurance authorization.  Follow Up Recommendations Skilled nursing-short term rehab (<3 hours/day) Can patient physically be transported by private vehicle: Yes    Assistance Recommended at Discharge Frequent or constant Supervision/Assistance  Patient can return home with the following  A little help with walking and/or transfers;A little help with bathing/dressing/bathroom;Assistance with cooking/housework;Assist for  transportation;Help with stairs or ramp for entrance    Equipment Recommendations Rolling walker (2 wheels)  Recommendations for Other Services       Functional Status Assessment Patient has had a recent decline in their functional status and demonstrates the ability to make significant improvements in function in a reasonable and predictable amount of time.     Precautions / Restrictions Precautions Precautions: Fall Precaution Comments: no pillow under the knee Restrictions Weight Bearing Restrictions: No Other Position/Activity Restrictions: wbat      Mobility  Bed Mobility Overal bed mobility: Needs Assistance Bed Mobility: Supine to Sit     Supine to sit: Min guard, HOB elevated     General bed mobility comments: Min guard for safety, no physical assist required. Pt reporting mild lightheadheadness upon sitting upright that remained through duration of session.    Transfers Overall transfer level: Needs assistance Equipment used: Rolling walker (2 wheels) Transfers: Sit to/from Stand Sit to Stand: Min guard, From elevated surface           General transfer comment: For safety only, multimodal cuing for hand placement.    Ambulation/Gait Ambulation/Gait assistance: Min guard Gait Distance (Feet): 20 Feet Assistive device: Rolling walker (2 wheels) Gait Pattern/deviations: Step-to pattern Gait velocity: decreased     General Gait Details: Pt ambulated with RW and min guard, no physical assist required or overt LOB noted.  Stairs            Wheelchair Mobility    Modified Rankin (Stroke Patients Only)       Balance Overall balance assessment: Needs assistance Sitting-balance support: Feet supported, No upper extremity supported Sitting balance-Leahy Scale: Good     Standing balance support: Reliant on assistive device for balance, During functional activity, Bilateral upper extremity supported Standing  balance-Leahy Scale: Poor                                Pertinent Vitals/Pain Pain Assessment Pain Assessment: 0-10 Pain Score: 2  Pain Location: right knee Pain Descriptors / Indicators: Operative site guarding Pain Intervention(s): Limited activity within patient's tolerance, Monitored during session, Repositioned, Ice applied    Home Living Family/patient expects to be discharged to:: Skilled nursing facility Living Arrangements: Alone                 Additional Comments: Lliving at [place]: elevator, walk-in shower with a small lip, regular height toilet. Has rollator. Call bells located behind bed, living room, bathroom.    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 Some stumbles prior to surgery but no falls. uses rollator ADLs Comments: IND     Hand Dominance   Dominant Hand: Right    Extremity/Trunk Assessment   Upper Extremity Assessment Upper Extremity Assessment: Overall WFL for tasks assessed    Lower Extremity Assessment Lower Extremity Assessment: RLE deficits/detail;LLE deficits/detail RLE Deficits / Details: MMT ank DF/PF 5/5, no extensor lag noted RLE Sensation: WNL LLE Deficits / Details: MMT ank DF/PF 5/5, LLE Sensation: WNL    Cervical / Trunk Assessment Cervical / Trunk Assessment: Normal  Communication   Communication: No difficulties  Cognition Arousal/Alertness: Awake/alert Behavior During Therapy: WFL for tasks assessed/performed Overall Cognitive Status: Within Functional Limits for tasks assessed                                          General Comments General comments (skin integrity, edema, etc.): Daughter [name] present    Exercises Total Joint Exercises Ankle Circles/Pumps: AROM, Both, 10 reps   Assessment/Plan    PT Assessment Patient needs continued PT services  PT Problem  List Decreased strength;Decreased range of motion;Decreased activity tolerance;Decreased balance;Decreased mobility;Decreased coordination;Pain       PT Treatment Interventions DME instruction;Gait training;Stair training;Functional mobility training;Therapeutic activities;Therapeutic exercise;Balance training;Neuromuscular re-education;Patient/family education    PT Goals (Current goals can be found in the Care Plan section)  Acute Rehab PT Goals Patient Stated Goal: To return to work and play golf PT Goal Formulation: With patient Time For Goal Achievement: 11/22/21 Potential to Achieve Goals: Good    Frequency 7X/week     Co-evaluation               AM-PAC PT "6 Clicks" Mobility  Outcome Measure Help needed turning from your back to your side while in a flat bed without using bedrails?: None Help needed moving from lying on your back to sitting on the side of a flat bed without using bedrails?: A Little Help needed moving to and from a bed to a chair (including a wheelchair)?: A Little Help needed standing up from a chair using your arms (e.g., wheelchair or bedside chair)?: A Little Help needed to walk in hospital room?: A Little Help needed climbing 3-5 steps with a railing? : A Lot 6 Click Score: 18    End of Session Equipment Utilized During Treatment: Gait belt Activity Tolerance: Patient tolerated treatment well;No increased pain;Other (comment) (Mild lightheadedness) Patient left: in chair;with call  bell/phone within reach;with chair alarm set;with family/visitor present;with SCD's reapplied Nurse Communication: Mobility status PT Visit Diagnosis: Pain;Difficulty in walking, not elsewhere classified (R26.2) Pain - Right/Left: Right Pain - part of body: Knee    Time: 1164-3539 PT Time Calculation (min) (ACUTE ONLY): 38 min   Charges:   PT Evaluation $PT Eval Low Complexity: 1 Low PT Treatments $Gait Training: 8-22 mins $Self Care/Home Management: 8-22        Coolidge Breeze, PT, DPT [facility] Department Office: [phone] Weekend pager: [phone]  Coolidge Breeze 11/15/2021, 5:21 PM

## 2021-11-16 ENCOUNTER — Inpatient Hospital Stay (HOSPITAL_COMMUNITY): Payer: Medicare Other

## 2021-11-16 ENCOUNTER — Other Ambulatory Visit (HOSPITAL_COMMUNITY): Payer: Self-pay

## 2021-11-16 ENCOUNTER — Other Ambulatory Visit: Payer: Self-pay

## 2021-11-16 ENCOUNTER — Telehealth (HOSPITAL_COMMUNITY): Payer: Self-pay

## 2021-11-16 ENCOUNTER — Encounter (HOSPITAL_COMMUNITY): Payer: Self-pay | Admitting: Orthopedic Surgery

## 2021-11-16 DIAGNOSIS — E782 Mixed hyperlipidemia: Secondary | ICD-10-CM | POA: Diagnosis present

## 2021-11-16 DIAGNOSIS — M62838 Other muscle spasm: Secondary | ICD-10-CM | POA: Diagnosis not present

## 2021-11-16 DIAGNOSIS — Z888 Allergy status to other drugs, medicaments and biological substances status: Secondary | ICD-10-CM | POA: Diagnosis not present

## 2021-11-16 DIAGNOSIS — Z6830 Body mass index (BMI) 30.0-30.9, adult: Secondary | ICD-10-CM | POA: Diagnosis not present

## 2021-11-16 DIAGNOSIS — Z8249 Family history of ischemic heart disease and other diseases of the circulatory system: Secondary | ICD-10-CM | POA: Diagnosis not present

## 2021-11-16 DIAGNOSIS — I110 Hypertensive heart disease with heart failure: Secondary | ICD-10-CM | POA: Diagnosis present

## 2021-11-16 DIAGNOSIS — K219 Gastro-esophageal reflux disease without esophagitis: Secondary | ICD-10-CM | POA: Diagnosis present

## 2021-11-16 DIAGNOSIS — Z8673 Personal history of transient ischemic attack (TIA), and cerebral infarction without residual deficits: Secondary | ICD-10-CM | POA: Diagnosis not present

## 2021-11-16 DIAGNOSIS — W19XXXA Unspecified fall, initial encounter: Secondary | ICD-10-CM | POA: Diagnosis not present

## 2021-11-16 DIAGNOSIS — Z886 Allergy status to analgesic agent status: Secondary | ICD-10-CM | POA: Diagnosis not present

## 2021-11-16 DIAGNOSIS — G8918 Other acute postprocedural pain: Secondary | ICD-10-CM | POA: Diagnosis not present

## 2021-11-16 DIAGNOSIS — Z981 Arthrodesis status: Secondary | ICD-10-CM | POA: Diagnosis not present

## 2021-11-16 DIAGNOSIS — Z885 Allergy status to narcotic agent status: Secondary | ICD-10-CM | POA: Diagnosis not present

## 2021-11-16 DIAGNOSIS — Z96651 Presence of right artificial knee joint: Secondary | ICD-10-CM | POA: Diagnosis not present

## 2021-11-16 DIAGNOSIS — F411 Generalized anxiety disorder: Secondary | ICD-10-CM | POA: Diagnosis not present

## 2021-11-16 DIAGNOSIS — B009 Herpesviral infection, unspecified: Secondary | ICD-10-CM | POA: Diagnosis not present

## 2021-11-16 DIAGNOSIS — Z91014 Allergy to mammalian meats: Secondary | ICD-10-CM | POA: Diagnosis not present

## 2021-11-16 DIAGNOSIS — R531 Weakness: Secondary | ICD-10-CM | POA: Diagnosis not present

## 2021-11-16 DIAGNOSIS — Z9852 Vasectomy status: Secondary | ICD-10-CM | POA: Diagnosis not present

## 2021-11-16 DIAGNOSIS — M25561 Pain in right knee: Secondary | ICD-10-CM | POA: Diagnosis not present

## 2021-11-16 DIAGNOSIS — F331 Major depressive disorder, recurrent, moderate: Secondary | ICD-10-CM | POA: Diagnosis not present

## 2021-11-16 DIAGNOSIS — Z471 Aftercare following joint replacement surgery: Secondary | ICD-10-CM | POA: Diagnosis not present

## 2021-11-16 DIAGNOSIS — Z9049 Acquired absence of other specified parts of digestive tract: Secondary | ICD-10-CM | POA: Diagnosis not present

## 2021-11-16 DIAGNOSIS — I5032 Chronic diastolic (congestive) heart failure: Secondary | ICD-10-CM | POA: Diagnosis present

## 2021-11-16 DIAGNOSIS — Z7901 Long term (current) use of anticoagulants: Secondary | ICD-10-CM | POA: Diagnosis not present

## 2021-11-16 DIAGNOSIS — M1711 Unilateral primary osteoarthritis, right knee: Secondary | ICD-10-CM | POA: Diagnosis present

## 2021-11-16 DIAGNOSIS — R413 Other amnesia: Secondary | ICD-10-CM | POA: Diagnosis present

## 2021-11-16 DIAGNOSIS — G43909 Migraine, unspecified, not intractable, without status migrainosus: Secondary | ICD-10-CM | POA: Diagnosis not present

## 2021-11-16 DIAGNOSIS — R296 Repeated falls: Secondary | ICD-10-CM | POA: Diagnosis present

## 2021-11-16 DIAGNOSIS — M21061 Valgus deformity, not elsewhere classified, right knee: Secondary | ICD-10-CM | POA: Diagnosis present

## 2021-11-16 DIAGNOSIS — Z7902 Long term (current) use of antithrombotics/antiplatelets: Secondary | ICD-10-CM | POA: Diagnosis not present

## 2021-11-16 DIAGNOSIS — E669 Obesity, unspecified: Secondary | ICD-10-CM | POA: Diagnosis present

## 2021-11-16 DIAGNOSIS — Z7401 Bed confinement status: Secondary | ICD-10-CM | POA: Diagnosis not present

## 2021-11-16 DIAGNOSIS — R251 Tremor, unspecified: Secondary | ICD-10-CM | POA: Diagnosis not present

## 2021-11-16 DIAGNOSIS — F3342 Major depressive disorder, recurrent, in full remission: Secondary | ICD-10-CM | POA: Diagnosis not present

## 2021-11-16 DIAGNOSIS — Z79899 Other long term (current) drug therapy: Secondary | ICD-10-CM | POA: Diagnosis not present

## 2021-11-16 LAB — CBC
HCT: 29 % — ABNORMAL LOW (ref 39.0–52.0)
Hemoglobin: 10 g/dL — ABNORMAL LOW (ref 13.0–17.0)
MCH: 34.8 pg — ABNORMAL HIGH (ref 26.0–34.0)
MCHC: 34.5 g/dL (ref 30.0–36.0)
MCV: 101 fL — ABNORMAL HIGH (ref 80.0–100.0)
Platelets: 269 10*3/uL (ref 150–400)
RBC: 2.87 MIL/uL — ABNORMAL LOW (ref 4.22–5.81)
RDW: 11.9 % (ref 11.5–15.5)
WBC: 10 10*3/uL (ref 4.0–10.5)
nRBC: 0 % (ref 0.0–0.2)

## 2021-11-16 LAB — BASIC METABOLIC PANEL
Anion gap: 5 (ref 5–15)
BUN: 12 mg/dL (ref 8–23)
CO2: 24 mmol/L (ref 22–32)
Calcium: 7.7 mg/dL — ABNORMAL LOW (ref 8.9–10.3)
Chloride: 106 mmol/L (ref 98–111)
Creatinine, Ser: 1.21 mg/dL (ref 0.61–1.24)
GFR, Estimated: >60 mL/min (ref >60)
Glucose, Bld: 112 mg/dL — ABNORMAL HIGH (ref 70–99)
Potassium: 3.7 mmol/L (ref 3.5–5.1)
Sodium: 135 mmol/L (ref 135–145)

## 2021-11-16 MED ORDER — FONDAPARINUX SODIUM 2.5 MG/0.5ML ~~LOC~~ SOLN
2.5000 mg | Freq: Every day | SUBCUTANEOUS | Status: DC
  Administered 2021-11-16 – 2021-11-19 (×4): 2.5 mg via SUBCUTANEOUS
  Filled 2021-11-16 (×5): qty 0.5

## 2021-11-16 MED ORDER — HYDROMORPHONE HCL 2 MG PO TABS
2.0000 mg | ORAL_TABLET | ORAL | Status: DC | PRN
  Administered 2021-11-16: 4 mg via ORAL
  Administered 2021-11-16: 2 mg via ORAL
  Administered 2021-11-16: 4 mg via ORAL
  Administered 2021-11-17: 2 mg via ORAL
  Filled 2021-11-16 (×2): qty 2
  Filled 2021-11-16 (×2): qty 1

## 2021-11-16 NOTE — Progress Notes (Signed)
   Subjective: 1 Day Post-Op Procedure(s) (LRB): TOTAL KNEE ARTHROPLASTY (Right) Patient reports pain as mild.   Patient seen in rounds by Dr. Wynelle Link. Patient is well, and has had no acute complaints or problems other than pain in the right knee. Tolerated the dilaudid well yesterday, will increase to 2-4 mg Q4 for better pain control. Denies chest pain or SOB. No issues overnight.  We will continue therapy today.  Objective: Vital signs in last 24 hours: Temp:  [97.6 F (36.4 C)-98.9 F (37.2 C)] 97.9 F (36.6 C) (10/31 0512) Pulse Rate:  [56-69] 61 (10/31 0512) Resp:  [13-22] 17 (10/31 0512) BP: (97-150)/(55-89) 120/80 (10/31 0512) SpO2:  [92 %-100 %] 95 % (10/31 0512) Weight:  [84.9 kg] 84.9 kg (10/30 0944)  Intake/Output from previous day:  Intake/Output Summary (Last 24 hours) at 11/16/2021 0757 Last data filed at 11/16/2021 0600 Gross per 24 hour  Intake 2411.92 ml  Output 1000 ml  Net 1411.92 ml     Intake/Output this shift: No intake/output data recorded.  Labs: Recent Labs    11/16/21 0335  HGB 10.0*   Recent Labs    11/16/21 0335  WBC 10.0  RBC 2.87*  HCT 29.0*  PLT 269   Recent Labs    11/16/21 0335  NA 135  K 3.7  CL 106  CO2 24  BUN 12  CREATININE 1.21  GLUCOSE 112*  CALCIUM 7.7*   No results for input(s): "LABPT", "INR" in the last 72 hours.  Exam: General - Patient is Alert and Oriented Extremity - Neurologically intact Neurovascular intact Sensation intact distally Dorsiflexion/Plantar flexion intact Dressing - dressing C/D/I Motor Function - intact, moving foot and toes well on exam.   Past Medical History:  Diagnosis Date   (HFpEF) heart failure with preserved ejection fraction (HCC)    Arthritis    Depression    Frequent urination    GERD (gastroesophageal reflux disease)    Headache    mirgaines in the past   Herpes    History of nuclear stress test    Myoview 05/2021: EF 77, normal perfusion, low risk    Hyperlipidemia    Hypertension    Memory loss    Obesity    Stroke ([place])    Urgency of urination     Assessment/Plan: 1 Day Post-Op Procedure(s) (LRB): TOTAL KNEE ARTHROPLASTY (Right) Principal Problem:   OA (osteoarthritis) of knee Active Problems:   Primary osteoarthritis of right knee  Estimated body mass index is 29.32 kg/m as calculated from the following:   Height as of this encounter: '5\' 7"'$  (1.702 m).   Weight as of this encounter: 84.9 kg. Advance diet Up with therapy  Anticipated LOS equal to or greater than 2 midnights due to - Age 69 and older with one or more of the following:  - Obesity  - Expected need for hospital services (PT, OT, Nursing) required for safe  discharge  - Anticipated need for postoperative skilled nursing care or inpatient rehab  - Active co-morbidities: Heart Failure and Stroke OR   - Unanticipated findings during/Post Surgery: None  - Patient is a high risk of re-admission due to: None   DVT Prophylaxis -  Arixtra Weight bearing as tolerated. Continue therapy.  Plan is to go to Skilled nursing facility after hospital stay. Discharge once bed is arranged, appreciative of social works assistance.  Theresa Duty, PA-C Orthopedic Surgery [phone] 11/16/2021, 7:57 AM

## 2021-11-16 NOTE — Progress Notes (Addendum)
Physical Therapy Treatment Patient Details Name: [name] MRN: [id number] DOB: Oct 18, 1950 Today's Date: 11/16/2021   History of Present Illness Pt is a 71yo male presenting s/p R-TKA on 11/15/21. PMH: CHF, OA, depression, GERD, herpes, HLD, HTN, memory loss, hx of stroke, AKI, depression, tardive dyskinesia,    PT Comments    Upon my arrival, pt was sitting with a pillow behind surgical knee and the nonoperative leg hanging off the legrest of the recliner. Pt appeared a bit disoriented/confused, stating "I don't know what to do next", "I feel like I'm out of this world". After walking halfway across the room, pt reported feeling lightheaded. For safety reasons, deferred further ambulation and assisted pt back to bedside. Will continue to progress activity as safely able. Continue to recommend ST SNF for rehab.    Recommendations for follow up therapy are one component of a multi-disciplinary discharge planning process, led by the attending physician.  Recommendations may be updated based on patient status, additional functional criteria and insurance authorization.  Follow Up Recommendations  Skilled nursing-short term rehab (<3 hours/day) Can patient physically be transported by private vehicle: Yes   Assistance Recommended at Discharge Frequent or constant Supervision/Assistance  Patient can return home with the following A little help with walking and/or transfers;A little help with bathing/dressing/bathroom;Assistance with cooking/housework;Assist for transportation;Help with stairs or ramp for entrance   Equipment Recommendations  Rolling walker (2 wheels)    Recommendations for Other Services       Precautions / Restrictions Precautions Precautions: Knee Precaution Comments: no pillow under the knee Restrictions Weight Bearing Restrictions: No Other Position/Activity Restrictions: wbat     Mobility  Bed Mobility Overal bed mobility: Needs Assistance Bed Mobility:  Supine to Sit, Sit to Supine     Supine to sit: Min guard, HOB elevated Sit to supine: Min assist   General bed mobility comments: Min A for LE onto bed. Increased time. Cues provided.    Transfers Overall transfer level: Needs assistance Equipment used: Rolling walker (2 wheels) Transfers: Sit to/from Stand Sit to Stand: Min assist           General transfer comment: A to power up from recliner. Cues for safety, hand/LE placement. Pt reported mild lightheadedness.    Ambulation/Gait Ambulation/Gait assistance: Min assist Gait Distance (Feet): 15 Feet Assistive device: Rolling walker (2 wheels) Gait Pattern/deviations: Step-to pattern, Antalgic       General Gait Details: Min A to stabilize pt throughout short distance. After taking a few steps, pt c/o lightheadedness and he still appeared a bit disoriented/confused. Deferred further ambulation and instructed pt to return to bedside for safety reasons   Stairs             Wheelchair Mobility    Modified Rankin (Stroke Patients Only)       Balance Overall balance assessment: Needs assistance         Standing balance support: Reliant on assistive device for balance, During functional activity, Bilateral upper extremity supported Standing balance-Leahy Scale: Poor                              Cognition Arousal/Alertness: Awake/alert Behavior During Therapy: WFL for tasks assessed/performed Overall Cognitive Status: Within Functional Limits for tasks assessed                                 General Comments:  appears drowsy and a bit disoriented- ? Meds. Conversation a bit inappropriate at times.         Exercises Total Joint Exercises Ankle Circles/Pumps: AROM, Both, 10 reps Quad Sets: AROM, Both, 10 reps Heel Slides: AAROM, Right, 10 reps Hip ABduction/ADduction: AAROM, Right, 10 reps Straight Leg Raises: AAROM, Right, 10 reps Goniometric ROM: ~10-65 degrees     General Comments        Pertinent Vitals/Pain Pain Assessment Pain Assessment: 0-10 Pain Score: 8  Pain Location: right knee Pain Descriptors / Indicators: Operative site guarding, Discomfort, Sore Pain Intervention(s): Limited activity within patient's tolerance, Monitored during session, Repositioned    Home Living                          Prior Function            PT Goals (current goals can now be found in the care plan section) Progress towards PT goals: Progressing toward goals    Frequency    7X/week      PT Plan Current plan remains appropriate    Co-evaluation              AM-PAC PT "6 Clicks" Mobility   Outcome Measure  Help needed turning from your back to your side while in a flat bed without using bedrails?: None Help needed moving from lying on your back to sitting on the side of a flat bed without using bedrails?: None Help needed moving to and from a bed to a chair (including a wheelchair)?: A Little Help needed standing up from a chair using your arms (e.g., wheelchair or bedside chair)?: A Little Help needed to walk in hospital room?: A Little Help needed climbing 3-5 steps with a railing? : A Lot 6 Click Score: 19    End of Session Equipment Utilized During Treatment: Gait belt Activity Tolerance: Patient limited by pain Patient left: with call bell/phone within reach;in bed;with bed alarm set   PT Visit Diagnosis: Pain;Difficulty in walking, not elsewhere classified (R26.2) Pain - Right/Left: Right Pain - part of body: Knee     Time: 4235-3614 PT Time Calculation (min) (ACUTE ONLY): 12 min  Charges:  $Gait Training: 8-22 mins $Therapeutic Exercise: 8-22 mins                        Doreatha Massed, PT Acute Rehabilitation  Office: [phone]

## 2021-11-16 NOTE — Telephone Encounter (Signed)
Pharmacy Patient Advocate Encounter  Insurance verification completed.    The patient is insured through [employer]   The patient is currently admitted and ran test claims for the following: Fondaparinux.  Copays and coinsurance results were relayed to Inpatient clinical team.

## 2021-11-16 NOTE — TOC Benefit Eligibility Note (Signed)
Patient Teacher, English as a foreign language completed.    The patient is currently admitted and upon discharge could be taking Fondaparinux 2.'5mg'$ /0.22m.  The current 30 day co-pay is $10.00.   The patient [place] through [place] Adv[place] Special[place] Adv[place] Team Direct Number: [phone]: [phone]

## 2021-11-16 NOTE — Progress Notes (Signed)
PT staff walked by pt's room and heard a loud noise. Staff went in room and noted patient sat on floor. Patient stated he called and didn't want to wait maybe be he can get out before we get to him. Called light on but did not go through nurse station.

## 2021-11-16 NOTE — NC FL2 (Cosign Needed)
[place] MEDICAID FL2 LEVEL OF CARE SCREENING TOOL     IDENTIFICATION  Patient Name: [name] Birthdate: [birth year] Sex: male Admission Date (Current Location): 11/15/2021  [facility] and [place] Number:  Herbalist and Address:  [facility],  [address] [place], [place]      Provider Number: [id number]  Attending Physician Name and Address:  Gaynelle Arabian, MD  Relative Name and Phone Number:  Daughter, Alvy Alsop @ [phone]    Current Level of Care: Hospital Recommended Level of Care: [place] Prior Approval Number:    Date Approved/Denied:   PASRR Number: [id number]  Discharge Plan: SNF    Current Diagnoses: Patient Active Problem List   Diagnosis Date Noted   OA (osteoarthritis) of knee 11/15/2021   Primary osteoarthritis of right knee 11/15/2021   Mixed hyperlipidemia 09/24/2021   AKI (acute kidney injury) ([place]) 04/09/2021   Recurrent major depressive disorder, in full remission ([place]) 01/07/2020   Chronic heart failure with preserved ejection fraction ([place]) 01/02/2020   Essential hypertension 01/02/2020   Right upper quadrant abdominal pain 12/16/2019   Precordial chest pain 12/15/2019   Elevated transaminase level 12/15/2019   MDD (major depressive disorder), recurrent, in partial remission ([place]) 02/07/2019   Severe recurrent major depression without psychotic features ([place]) 02/02/2019   Tardive dyskinesia 12/03/2018   Pure hypercholesterolemia 06/14/2017   History of stroke 11/11/2015   Memory loss 10/16/2013   Tremor 04/04/2013    Orientation RESPIRATION BLADDER Height & Weight     Self, Time, Situation, Place  Normal Continent Weight: 187 lb 2.7 oz (84.9 kg) Height:  '5\' 7"'$  (170.2 cm)  BEHAVIORAL SYMPTOMS/MOOD NEUROLOGICAL BOWEL NUTRITION STATUS      Continent Diet (Regular)  AMBULATORY STATUS COMMUNICATION OF NEEDS Skin   Limited Assist Verbally Other (Comment) (surgical incision only)                        Personal Care Assistance Level of Assistance  Bathing, Dressing Bathing Assistance: Limited assistance   Dressing Assistance: Limited assistance     Functional Limitations Info  Speech     Speech Info: Impaired    SPECIAL CARE FACTORS FREQUENCY  PT (By licensed PT), OT (By licensed OT)     PT Frequency: 5x/wk OT Frequency: 5x/wk            Contractures Contractures Info: Not present    Additional Factors Info  Code Status, Allergies, Psychotropic Code Status Info: Full Allergies Info: Amitriptyline, Aspirin, Lisinopril, Lithium, Morphine, Oxycodone, Oxycodone Hcl, Oxycodone-acetaminophen, Pork Allergy, Pork-derived Products Psychotropic Info: see MAR         Current Medications (11/17/2021):  This is the current hospital active medication list Current Facility-Administered Medications  Medication Dose Route Frequency Provider Last Rate Last Admin   0.9 %  sodium chloride infusion   Intravenous Continuous Edmisten, Kristie L, PA 75 mL/hr at 11/15/21 1445 New Bag at 11/15/21 1445   acetaminophen (TYLENOL) tablet 500-1,000 mg  500-1,000 mg Oral Q6H PRN Edmisten, Kristie L, PA       amLODipine (NORVASC) tablet 5 mg  5 mg Oral Daily Edmisten, Kristie L, PA   5 mg at 11/17/21 0854   bisacodyl (DULCOLAX) suppository 10 mg  10 mg Rectal Daily PRN Edmisten, Kristie L, PA       busPIRone (BUSPAR) tablet 10 mg  10 mg Oral Daily Edmisten, Kristie L, PA   10 mg at 11/17/21 0852   diphenhydrAMINE (  BENADRYL) 12.5 MG/5ML elixir 12.5-25 mg  12.5-25 mg Oral Q4H PRN Edmisten, Kristie L, PA   25 mg at 11/15/21 1928   docusate sodium (COLACE) capsule 100 mg  100 mg Oral BID Edmisten, Kristie L, PA   100 mg at 11/17/21 0853   fondaparinux (ARIXTRA) injection 2.5 mg  2.5 mg Subcutaneous QAC breakfast Edmisten, Kristie L, PA   2.5 mg at 11/17/21 0855   furosemide (LASIX) tablet 40 mg  40 mg Oral Daily Edmisten, Kristie L, PA   40 mg at 11/17/21 0853   hydrALAZINE  (APRESOLINE) tablet 50 mg  50 mg Oral BID Edmisten, Kristie L, PA   50 mg at 11/17/21 0854   HYDROmorphone (DILAUDID) tablet 1 mg  1 mg Oral Q4H PRN Edmisten, Kristie L, PA   1 mg at 11/17/21 1137   irbesartan (AVAPRO) tablet 300 mg  300 mg Oral Daily Edmisten, Kristie L, PA   300 mg at 11/17/21 3664   menthol-cetylpyridinium (CEPACOL) lozenge 3 mg  1 lozenge Oral PRN Edmisten, Kristie L, PA       Or   phenol (CHLORASEPTIC) mouth spray 1 spray  1 spray Mouth/Throat PRN Edmisten, Kristie L, PA       methocarbamol (ROBAXIN) tablet 500 mg  500 mg Oral Q6H PRN Edmisten, Kristie L, PA   500 mg at 11/17/21 4034   Or   methocarbamol (ROBAXIN) 500 mg in dextrose 5 % 50 mL IVPB  500 mg Intravenous Q6H PRN Edmisten, Kristie L, PA   Stopped at 11/15/21 1814   metoCLOPramide (REGLAN) tablet 5-10 mg  5-10 mg Oral Q8H PRN Edmisten, Kristie L, PA       Or   metoCLOPramide (REGLAN) injection 5-10 mg  5-10 mg Intravenous Q8H PRN Edmisten, Kristie L, PA       metoprolol succinate (TOPROL-XL) 24 hr tablet 25 mg  25 mg Oral Daily Edmisten, Kristie L, PA   25 mg at 11/17/21 0854   morphine (PF) 2 MG/ML injection 1-2 mg  1-2 mg Intravenous Q2H PRN Edmisten, Kristie L, PA   2 mg at 11/15/21 2226   ondansetron (ZOFRAN) tablet 4 mg  4 mg Oral Q6H PRN Edmisten, Kristie L, PA       Or   ondansetron (ZOFRAN) injection 4 mg  4 mg Intravenous Q6H PRN Edmisten, Kristie L, PA       PARoxetine (PAXIL-CR) 24 hr tablet 37.5 mg  37.5 mg Oral QHS Edmisten, Kristie L, PA   37.5 mg at 11/16/21 2157   polyethylene glycol (MIRALAX / GLYCOLAX) packet 17 g  17 g Oral Daily PRN Edmisten, Kristie L, PA       primidone (MYSOLINE) tablet 250 mg  250 mg Oral BID Edmisten, Kristie L, PA   250 mg at 11/17/21 0853   QUEtiapine (SEROQUEL) tablet 75 mg  75 mg Oral QHS Edmisten, Kristie L, PA   75 mg at 11/16/21 2157   rosuvastatin (CRESTOR) tablet 20 mg  20 mg Oral Daily Edmisten, Kristie L, PA   20 mg at 11/17/21 0854   sodium phosphate (FLEET)  7-19 GM/118ML enema 1 enema  1 enema Rectal Once PRN Edmisten, Kristie L, PA       spironolactone (ALDACTONE) tablet 25 mg  25 mg Oral Daily Edmisten, Kristie L, PA   25 mg at 11/17/21 0853   traZODone (DESYREL) tablet 150 mg  150 mg Oral QHS PRN Edmisten, Kristie L, PA   150 mg at 11/16/21 2205  Discharge Medications: Please see discharge summary for a list of discharge medications.  Relevant Imaging Results:  Relevant Lab Results:   Additional Information SS# [id number]  Lennart Pall, LCSW

## 2021-11-16 NOTE — Progress Notes (Signed)
Transition of Care (TOC) -30 day Note       Patient Details  Name: Akili Corsetti MRN:  [id number] Date of Birth:  [birth year]   Transition of Care [facility]) CM/SW Contact  Name: Lennart Pall, [place] Phone Number:  [phone] Date:  11/16/21 Time:  1130   MUST ID: [id number]   To Whom it May Concern:   Please be advised that the above patient will require a short-term nursing home stay, anticipated 30 days or less rehabilitation and strengthening. The plan is for return home.

## 2021-11-16 NOTE — Plan of Care (Signed)
  Problem: Activity: Goal: Ability to avoid complications of mobility impairment will improve Outcome: Progressing Goal: Range of joint motion will improve Outcome: Progressing   Problem: Pain Management: Goal: Pain level will decrease with appropriate interventions Outcome: Progressing   

## 2021-11-16 NOTE — TOC Initial Note (Signed)
Transition of Care [facility]) - Initial/Assessment Note    Patient Details  Name: [name] MRN: [id number] Date of Birth: 07/07/1950  Transition of Care [facility]) CM/SW Contact:    Lennart Pall, LCSW Phone Number: 11/16/2021, 11:24 AM  Clinical Narrative:                 Met with pt and daughter, [name], today to confirm noted dc plan of SNF at [facility].  Pt admitted from [place] apt at [facility] and confirms plans for SNF rehab prior to his return.  Daughter reports that she met with Admissions Coordinator with [facility] last week and feels they will have bed ready for pt.  At this point, CSW does need to secure PASRR and possible [facility] waiver for SNF.    Expected Discharge Plan: Skilled Nursing Facility Barriers to Discharge: No Barriers Identified   Patient Goals and CMS Choice Patient states their goals for this hospitalization and ongoing recovery are:: return to [place] apt following SNF rehab      Expected Discharge Plan and Services Expected Discharge Plan: [place] In-house Referral: Clinical Social Work   Post Acute Care Choice: [place] Living arrangements for the past 2 months: [place]                 DME Arranged: N/A                    Prior Living Arrangements/Services Living arrangements for the past 2 months: [place] Lives with:: Self, Facility Resident Patient language and need for interpreter reviewed:: Yes Do you feel safe going back to the place where you live?: Yes      Need for Family Participation in Patient Care: No (Comment) Care giver support system in place?: Yes (comment)   Criminal Activity/Legal Involvement Pertinent to Current Situation/Hospitalization: No - Comment as needed  Activities of Daily Living Home Assistive Devices/Equipment: Blood pressure cuff, Walker (specify type), Hand-held shower hose, Grab bars in shower, Grab bars around toilet ADL Screening  (condition at time of admission) Patient's cognitive ability adequate to safely complete daily activities?: Yes Is the patient deaf or have difficulty hearing?: No Does the patient have difficulty seeing, even when wearing glasses/contacts?: No Does the patient have difficulty concentrating, remembering, or making decisions?: No Patient able to express need for assistance with ADLs?: Yes Does the patient have difficulty dressing or bathing?: Yes Independently performs ADLs?: No Communication: Independent Dressing (OT): Needs assistance Is this a change from baseline?: Change from baseline, expected to last <3days Grooming: Independent Feeding: Independent Bathing: Needs assistance Is this a change from baseline?: Change from baseline, expected to last <3 days Toileting: Needs assistance Is this a change from baseline?: Change from baseline, expected to last <3 days In/Out Bed: Needs assistance Is this a change from baseline?: Change from baseline, expected to last <3 days Does the patient have difficulty walking or climbing stairs?: Yes Weakness of Legs: Right Weakness of Arms/Hands: None  Permission Sought/Granted Permission sought to share information with : Facility Sport and exercise psychologist                Emotional Assessment Appearance:: Appears stated age Attitude/Demeanor/Rapport: Gracious Affect (typically observed): Accepting Orientation: : Oriented to Self, Oriented to Place, Oriented to  Time, Oriented to Situation Alcohol / Substance Use: Not Applicable Psych Involvement: No (comment)  Admission diagnosis:  Primary osteoarthritis of right knee [M17.11] Patient Active Problem List   Diagnosis Date Noted  OA (osteoarthritis) of knee 11/15/2021   Primary osteoarthritis of right knee 11/15/2021   Mixed hyperlipidemia 09/24/2021   AKI (acute kidney injury) ([place]) 04/09/2021   Recurrent major depressive disorder, in full remission ([place]) 01/07/2020   Chronic heart  failure with preserved ejection fraction ([place]) 01/02/2020   Essential hypertension 01/02/2020   Right upper quadrant abdominal pain 12/16/2019   Precordial chest pain 12/15/2019   Elevated transaminase level 12/15/2019   MDD (major depressive disorder), recurrent, in partial remission ([place]) 02/07/2019   Severe recurrent major depression without psychotic features ([place]) 02/02/2019   Tardive dyskinesia 12/03/2018   Pure hypercholesterolemia 06/14/2017   History of stroke 11/11/2015   Memory loss 10/16/2013   Tremor 04/04/2013   PCP:  Kristie Cowman, MD Pharmacy:   [facility], [address] [place] [address] Phone: [phone] Fax: [phone]     Social Determinants of Health (SDOH) Interventions    Readmission Risk Interventions     No data to display

## 2021-11-16 NOTE — Progress Notes (Signed)
Physical Therapy Treatment Patient Details Name: [name] MRN: [id number] DOB: 02/01/1950 Today's Date: 11/16/2021   History of Present Illness Pt is a 71yo male presenting s/p R-TKA s/p R-TKA on 11/15/21. PMH: CHF, OA, depression, GERD, herpes, HLD, HTN, memory loss, hx of stroke, AKI, depression, tardive dyskinesia,    PT Comments    Pt reported experiencing lightheadedness during session. Moderate pain with activity. Continue to recommend SNF.    Recommendations for follow up therapy are one component of a multi-disciplinary discharge planning process, led by the attending physician.  Recommendations may be updated based on patient status, additional functional criteria and insurance authorization.  Follow Up Recommendations  Skilled nursing-short term rehab (<3 hours/day) Can patient physically be transported by private vehicle: Yes   Assistance Recommended at Discharge Frequent or constant Supervision/Assistance  Patient can return home with the following A little help with walking and/or transfers;A little help with bathing/dressing/bathroom;Assistance with cooking/housework;Assist for transportation;Help with stairs or ramp for entrance   Equipment Recommendations  Rolling walker (2 wheels)    Recommendations for Other Services       Precautions / Restrictions Precautions Precautions: Fall Precaution Comments: no pillow under the knee Restrictions Weight Bearing Restrictions: No Other Position/Activity Restrictions: wbat     Mobility  Bed Mobility Overal bed mobility: Needs Assistance Bed Mobility: Supine to Sit     Supine to sit: Min guard, HOB elevated     General bed mobility comments: Min guard for safety, no physical assist required. Pt reporting mild lightheadheadness    Transfers Overall transfer level: Needs assistance Equipment used: Rolling walker (2 wheels) Transfers: Sit to/from Stand Sit to Stand: Min guard, From elevated surface            General transfer comment: Min guard for safety. Cues for safety, hand/LE placement. Pt reported mild lightheadedness.    Ambulation/Gait Ambulation/Gait assistance: Min guard Gait Distance (Feet): 60 Feet Assistive device: Rolling walker (2 wheels) Gait Pattern/deviations: Step-to pattern       General Gait Details: Min guard for safety. Slow gait speed. Followed with recliner for safety. Pt continued to report experiencing lightheadedness.   Stairs             Wheelchair Mobility    Modified Rankin (Stroke Patients Only)       Balance Overall balance assessment: Needs assistance         Standing balance support: Reliant on assistive device for balance, During functional activity, Bilateral upper extremity supported Standing balance-Leahy Scale: Poor                              Cognition Arousal/Alertness: Awake/alert Behavior During Therapy: WFL for tasks assessed/performed Overall Cognitive Status: Within Functional Limits for tasks assessed                                          Exercises Total Joint Exercises Ankle Circles/Pumps: AROM, Both, 10 reps Quad Sets: AROM, Both, 10 reps Heel Slides: AAROM, Right, 10 reps Hip ABduction/ADduction: AAROM, Right, 10 reps Straight Leg Raises: AAROM, Right, 10 reps Goniometric ROM: ~10-65 degrees    General Comments        Pertinent Vitals/Pain Pain Assessment Pain Assessment: 0-10 Pain Score: 5  Pain Location: right knee Pain Descriptors / Indicators: Operative site guarding, Discomfort, Sore Pain Intervention(s): Monitored during session,  Repositioned, Limited activity within patient's tolerance, Ice applied    Home Living                          Prior Function            PT Goals (current goals can now be found in the care plan section) Progress towards PT goals: Progressing toward goals    Frequency    7X/week      PT Plan Current plan  remains appropriate    Co-evaluation              AM-PAC PT "6 Clicks" Mobility   Outcome Measure  Help needed turning from your back to your side while in a flat bed without using bedrails?: None Help needed moving from lying on your back to sitting on the side of a flat bed without using bedrails?: None Help needed moving to and from a bed to a chair (including a wheelchair)?: A Little Help needed standing up from a chair using your arms (e.g., wheelchair or bedside chair)?: A Little Help needed to walk in hospital room?: A Little Help needed climbing 3-5 steps with a railing? : A Lot 6 Click Score: 19    End of Session Equipment Utilized During Treatment: Gait belt Activity Tolerance: Patient tolerated treatment well Patient left: in chair;with call bell/phone within reach;with chair alarm set   PT Visit Diagnosis: Pain;Difficulty in walking, not elsewhere classified (R26.2) Pain - Right/Left: Right Pain - part of body: Knee     Time: 0930-1009 PT Time Calculation (min) (ACUTE ONLY): 39 min  Charges:  $Gait Training: 23-37 mins $Therapeutic Exercise: 8-22 mins                        Doreatha Massed, PT Acute Rehabilitation  Office: [phone]

## 2021-11-17 ENCOUNTER — Telehealth: Payer: Self-pay | Admitting: Internal Medicine

## 2021-11-17 ENCOUNTER — Telehealth: Payer: Self-pay | Admitting: Neurology

## 2021-11-17 LAB — CBC
HCT: 30.2 % — ABNORMAL LOW (ref 39.0–52.0)
Hemoglobin: 10.3 g/dL — ABNORMAL LOW (ref 13.0–17.0)
MCH: 34.4 pg — ABNORMAL HIGH (ref 26.0–34.0)
MCHC: 34.1 g/dL (ref 30.0–36.0)
MCV: 101 fL — ABNORMAL HIGH (ref 80.0–100.0)
Platelets: 275 10*3/uL (ref 150–400)
RBC: 2.99 MIL/uL — ABNORMAL LOW (ref 4.22–5.81)
RDW: 12.2 % (ref 11.5–15.5)
WBC: 10.7 10*3/uL — ABNORMAL HIGH (ref 4.0–10.5)
nRBC: 0 % (ref 0.0–0.2)

## 2021-11-17 MED ORDER — METHOCARBAMOL 500 MG PO TABS: 500.0000 mg | ORAL_TABLET | Freq: Four times a day (QID) | ORAL | 0 refills | Status: DC | PRN

## 2021-11-17 MED ORDER — FONDAPARINUX SODIUM 2.5 MG/0.5ML ~~LOC~~ SOLN: 2.5000 mg | Freq: Every day | SUBCUTANEOUS | 0 refills | Status: DC

## 2021-11-17 MED ORDER — FUROSEMIDE 40 MG PO TABS
40.0000 mg | ORAL_TABLET | Freq: Every day | ORAL | Status: DC
  Administered 2021-11-17 – 2021-11-19 (×3): 40 mg via ORAL
  Filled 2021-11-17 (×3): qty 1

## 2021-11-17 MED ORDER — HYDROMORPHONE HCL 2 MG PO TABS: 1.0000 mg | ORAL_TABLET | ORAL | 0 refills | Status: DC | PRN

## 2021-11-17 MED ORDER — HYDROMORPHONE HCL 2 MG PO TABS
1.0000 mg | ORAL_TABLET | ORAL | Status: DC | PRN
  Administered 2021-11-17 – 2021-11-18 (×4): 1 mg via ORAL
  Filled 2021-11-17 (×4): qty 1

## 2021-11-17 MED ORDER — FUROSEMIDE 40 MG PO TABS: 40.0000 mg | ORAL_TABLET | Freq: Every day | ORAL | 0 refills | Status: DC

## 2021-11-17 MED ORDER — ACETAMINOPHEN 500 MG PO TABS
500.0000 mg | ORAL_TABLET | Freq: Four times a day (QID) | ORAL | Status: DC | PRN
  Administered 2021-11-18 – 2021-11-19 (×5): 1000 mg via ORAL
  Filled 2021-11-17: qty 1
  Filled 2021-11-17 (×2): qty 2
  Filled 2021-11-17: qty 1
  Filled 2021-11-17 (×2): qty 2

## 2021-11-17 NOTE — Progress Notes (Signed)
Physical Therapy Treatment Patient Details Name: [name] MRN: [id number] DOB: [birth year] Today's Date: 11/17/2021   History of Present Illness Pt is a 71yo male presenting s/p R-TKA on 11/15/21. PMH: CHF, OA, depression, GERD, herpes, HLD, HTN, memory loss, hx of stroke, AKI, depression, tardive dyskinesia,    PT Comments    POD x 2 am session. Patient had yet to take pain meds but agreed to therapy session, alerted nurse. Pt reported pain up to 10 in the R knee and it reduced to a 8 during ambulation. Patient was inconsistent with responses, appeared drowsy and a bit disoriented- daughter stated he's been like this since they've put him on meds. Required min assist with bed mobility. Min-mod assist with transfers to power up from bed and maintain initial balance, and min assist needed during ambulation to stabilize pt throughout short distance with recliner follow. Pt reported mild lightheadedness after transfers. Cues needed for safe hand placement. Educated pt and daughter Patient plans on discharging to SNF.  Recommendations for follow up therapy are one component of a multi-disciplinary discharge planning process, led by the attending physician.  Recommendations may be updated based on patient status, additional functional criteria and insurance authorization.  Follow Up Recommendations  Skilled nursing-short term rehab (<3 hours/day) Can patient physically be transported by private vehicle: Yes   Assistance Recommended at Discharge Frequent or constant Supervision/Assistance  Patient can return home with the following A little help with walking and/or transfers;A little help with bathing/dressing/bathroom;Assistance with cooking/housework;Assist for transportation;Help with stairs or ramp for entrance   Equipment Recommendations  Rolling walker (2 wheels)    Recommendations for Other Services       Precautions / Restrictions Precautions Precautions: Knee Precaution  Comments: no pillow under the knee Restrictions Weight Bearing Restrictions: No     Mobility  Bed Mobility Overal bed mobility: Needs Assistance Bed Mobility: Supine to Sit     Supine to sit: Min assist, HOB elevated     General bed mobility comments: Required safety belt assist to guide R LE off of bed.    Transfers Overall transfer level: Needs assistance Equipment used: Rolling walker (2 wheels)   Sit to Stand: Min assist, Mod assist           General transfer comment: Assist needed to power up from bed and to maintain initial balance. Pt reported mild lightheadedness. cues needed for safe hand placement.    Ambulation/Gait Ambulation/Gait assistance: Min assist Gait Distance (Feet): 20 Feet Assistive device: Rolling walker (2 wheels) Gait Pattern/deviations: Step-to pattern, Antalgic, Decreased step length - right, Decreased stance time - right Gait velocity: decreased     General Gait Details: Min A to stabilize pt throughout short distance with recliner follow.   Stairs             Wheelchair Mobility    Modified Rankin (Stroke Patients Only)       Balance Overall balance assessment: Needs assistance Sitting-balance support: Feet supported, No upper extremity supported Sitting balance-Leahy Scale: Good     Standing balance support: Reliant on assistive device for balance, During functional activity, Bilateral upper extremity supported Standing balance-Leahy Scale: Poor                              Cognition Arousal/Alertness: Awake/alert Behavior During Therapy: WFL for tasks assessed/performed Overall Cognitive Status: Within Functional Limits for tasks assessed  General Comments: appears drowsy and a bit disoriented- daughter stated he's been like this since they've put him on meds.        Exercises      General Comments        Pertinent Vitals/Pain Pain  Assessment Pain Assessment: 0-10 Pain Score: 10-Worst pain ever Pain Location: right knee Pain Descriptors / Indicators: Operative site guarding, Discomfort, Sore Pain Intervention(s): Limited activity within patient's tolerance, Monitored during session, Ice applied, Patient requesting pain meds-RN notified    Home Living                          Prior Function            PT Goals (current goals can now be found in the care plan section) Acute Rehab PT Goals Patient Stated Goal: To return to work and play golf PT Goal Formulation: With patient Time For Goal Achievement: 11/22/21 Potential to Achieve Goals: Good Progress towards PT goals: Progressing toward goals    Frequency    7X/week      PT Plan Current plan remains appropriate    Co-evaluation              AM-PAC PT "6 Clicks" Mobility   Outcome Measure  Help needed turning from your back to your side while in a flat bed without using bedrails?: None Help needed moving from lying on your back to sitting on the side of a flat bed without using bedrails?: None Help needed moving to and from a bed to a chair (including a wheelchair)?: A Little Help needed standing up from a chair using your arms (e.g., wheelchair or bedside chair)?: A Little Help needed to walk in hospital room?: A Little Help needed climbing 3-5 steps with a railing? : A Lot 6 Click Score: 19    End of Session Equipment Utilized During Treatment: Gait belt Activity Tolerance: Patient limited by pain Patient left: in chair;with family/visitor present;with chair alarm set;with call bell/phone within reach Nurse Communication: Mobility status PT Visit Diagnosis: Pain;Difficulty in walking, not elsewhere classified (R26.2) Pain - Right/Left: Right Pain - part of body: Knee     Time: 1110-1135 PT Time Calculation (min) (ACUTE ONLY): 25 min  Charges:  $Gait Training: 8-22 mins $Therapeutic Activity: 8-22 mins                         Allyson Sabal 11/17/2021, 12:58 PM

## 2021-11-17 NOTE — Telephone Encounter (Signed)
Pt 's daughter states pt has had his Knee replacement and is currently still in hospital before going to rehab.  Daughter states since Dr [name] is a part of pt's pain management she is wanting to ensure Dr [name] is made aware of changes such as the hospital putting pt on another blood thinner, different from what Dr [name] put him on.  Daughter asked it be noted that pt has fallen twice since the knee replacement, please call pt's daughter.

## 2021-11-17 NOTE — Progress Notes (Addendum)
Physical Therapy Treatment Patient Details Name: [name] MRN: [id number] DOB: May 17, 1950 Today's Date: 11/17/2021   History of Present Illness Pt is a 71yo male presenting s/p R-TKA on 11/15/21. PMH: CHF, OA, depression, GERD, herpes, HLD, HTN, memory loss, hx of stroke, AKI, depression, tardive dyskinesia,    PT Comments    POD x 2 pm session. Patient appeared drowsy and disoriented. Pt reported 8/10 pain in R knee at rest and 10/10 pain during ambulation. Required min assist for bed mobility and min/mod assist for transfers and ambulation. LOB x1 with ambulation but therapist recovered and was able to continue. Pt was unable to ambulate as far due to increased pain. He stated "this is not it", returned pt back to recliner. Educated patient on safe transfers, use of safety belt to guide R LE off of bed, and proper management of RW during ambulation. Patient plans on discharging to SNF.  Recommendations for follow up therapy are one component of a multi-disciplinary discharge planning process, led by the attending physician.  Recommendations may be updated based on patient status, additional functional criteria and insurance authorization.  Follow Up Recommendations  Skilled nursing-short term rehab (<3 hours/day) Can patient physically be transported by private vehicle: Yes   Assistance Recommended at Discharge Frequent or constant Supervision/Assistance  Patient can return home with the following A little help with walking and/or transfers;A little help with bathing/dressing/bathroom;Assistance with cooking/housework;Assist for transportation;Help with stairs or ramp for entrance   Equipment Recommendations  Rolling walker (2 wheels)    Recommendations for Other Services       Precautions / Restrictions Precautions Precautions: Knee Precaution Comments: no pillow under the knee Restrictions Weight Bearing Restrictions: No Other Position/Activity Restrictions: wbat      Mobility  Bed Mobility Overal bed mobility: Needs Assistance Bed Mobility: Supine to Sit     Supine to sit: HOB elevated, Min assist Sit to supine: Min assist   General bed mobility comments: Required min assist to guide B LE off of bed also used safety belt assist to guide R LE off of bed.    Transfers Overall transfer level: Needs assistance Equipment used: Rolling walker (2 wheels) Transfers: Sit to/from Stand Sit to Stand: Min assist, Mod assist           General transfer comment: Assist needed to power up from bed and to maintain initial balance. Cues needed for safe hand placement.    Ambulation/Gait Ambulation/Gait assistance: Min assist, Mod assist Gait Distance (Feet): 15 Feet Assistive device: Rolling walker (2 wheels) Gait Pattern/deviations: Step-to pattern, Antalgic, Decreased step length - right, Decreased stance time - right Gait velocity: decreased     General Gait Details: Min/mod A to stabilize pt throughout short distance with recliner follow.   Stairs             Wheelchair Mobility    Modified Rankin (Stroke Patients Only)       Balance Overall balance assessment: Needs assistance Sitting-balance support: Feet supported, No upper extremity supported Sitting balance-Leahy Scale: Good     Standing balance support: Reliant on assistive device for balance, During functional activity, Bilateral upper extremity supported Standing balance-Leahy Scale: Poor                              Cognition Arousal/Alertness: Awake/alert Behavior During Therapy: WFL for tasks assessed/performed Overall Cognitive Status: Within Functional Limits for tasks assessed  General Comments: appears drowsy and a bit disoriented        Exercises      General Comments        Pertinent Vitals/Pain Pain Assessment Pain Assessment: 0-10 Pain Score: 10-Worst pain ever Pain Location: right  knee Pain Descriptors / Indicators: Operative site guarding, Discomfort, Sore Pain Intervention(s): Limited activity within patient's tolerance, Monitored during session, Premedicated before session, Ice applied    Home Living                          Prior Function            PT Goals (current goals can now be found in the care plan section) Acute Rehab PT Goals Patient Stated Goal: To return to work and play golf PT Goal Formulation: With patient Time For Goal Achievement: 11/22/21 Potential to Achieve Goals: Good Progress towards PT goals: Progressing toward goals    Frequency    7X/week      PT Plan Current plan remains appropriate    Co-evaluation              AM-PAC PT "6 Clicks" Mobility   Outcome Measure  Help needed turning from your back to your side while in a flat bed without using bedrails?: A Little Help needed moving from lying on your back to sitting on the side of a flat bed without using bedrails?: A Lot Help needed moving to and from a bed to a chair (including a wheelchair)?: A Little Help needed standing up from a chair using your arms (e.g., wheelchair or bedside chair)?: A Little Help needed to walk in hospital room?: A Little Help needed climbing 3-5 steps with a railing? : A Lot 6 Click Score: 16    End of Session Equipment Utilized During Treatment: Gait belt Activity Tolerance: Patient limited by pain Patient left: in bed;with call bell/phone within reach;with bed alarm set Nurse Communication: Mobility status PT Visit Diagnosis: Pain;Difficulty in walking, not elsewhere classified (R26.2) Pain - Right/Left: Right Pain - part of body: Knee     Time: 0160-1093 PT Time Calculation (min) (ACUTE ONLY): 18 min  Charges:  $Gait Training: 8-22 mins $Therapeutic Activity: 8-22 mins                        Juris Gosnell 11/17/2021, 3:55 PM

## 2021-11-17 NOTE — Progress Notes (Signed)
   Subjective: 2 Days Post-Op Procedure(s) (LRB): TOTAL KNEE ARTHROPLASTY (Right) Patient reports pain as mild.   Patient seen in rounds by Dr. Wynelle Link. Patient climbed over bed rail last night and slid onto the ground. Additionally fell yesterday onto his bottom after not utilizing the call bell. Xrays were obtained which showed no injury. Reduced narcotic dose to 1 mg dilaudid, unfortunately cannot tolerate lesser pain medications.  We will continue therapy today.  Objective: Vital signs in last 24 hours: Temp:  [98.1 F (36.7 C)-99 F (37.2 C)] 98.7 F (37.1 C) (11/01 0623) Pulse Rate:  [58-78] 78 (11/01 0623) Resp:  [17-20] 20 (11/01 0623) BP: (123-144)/(69-79) 134/69 (11/01 0623) SpO2:  [91 %-100 %] 91 % (11/01 0623)  Intake/Output from previous day:  Intake/Output Summary (Last 24 hours) at 11/17/2021 0801 Last data filed at 11/17/2021 0600 Gross per 24 hour  Intake 600 ml  Output 1900 ml  Net -1300 ml     Intake/Output this shift: No intake/output data recorded.  Labs: Recent Labs    11/16/21 0335 11/17/21 0334  HGB 10.0* 10.3*   Recent Labs    11/16/21 0335 11/17/21 0334  WBC 10.0 10.7*  RBC 2.87* 2.99*  HCT 29.0* 30.2*  PLT 269 275   Recent Labs    11/16/21 0335  NA 135  K 3.7  CL 106  CO2 24  BUN 12  CREATININE 1.21  GLUCOSE 112*  CALCIUM 7.7*   No results for input(s): "LABPT", "INR" in the last 72 hours.  Exam: General - Patient is Alert and Oriented Extremity - Neurologically intact Neurovascular intact Sensation intact distally Dorsiflexion/Plantar flexion intact Dressing - dressing C/D/I Motor Function - intact, moving foot and toes well on exam.   Past Medical History:  Diagnosis Date   (HFpEF) heart failure with preserved ejection fraction (HCC)    Arthritis    Depression    Frequent urination    GERD (gastroesophageal reflux disease)    Headache    mirgaines in the past   Herpes    History of nuclear stress test     Myoview 05/2021: EF 77, normal perfusion, low risk   Hyperlipidemia    Hypertension    Memory loss    Obesity    Stroke ([place])    Urgency of urination     Assessment/Plan: 2 Days Post-Op Procedure(s) (LRB): TOTAL KNEE ARTHROPLASTY (Right) Principal Problem:   OA (osteoarthritis) of knee Active Problems:   Primary osteoarthritis of right knee  Estimated body mass index is 29.32 kg/m as calculated from the following:   Height as of this encounter: '5\' 7"'$  (1.702 m).   Weight as of this encounter: 84.9 kg. Up with therapy   DVT Prophylaxis -  Arixtra Weight bearing as tolerated. Continue therapy.  Social work arranging bed at [facility], will discharge once available.   Theresa Duty, PA-C Orthopedic Surgery [phone] 11/17/2021, 8:01 AM

## 2021-11-17 NOTE — Telephone Encounter (Signed)
Spoke with daughter,Whitney,  advised that Dr. Gasper Sells is aware of patient's hospital admission and has consulted with the hospital team on the patient's care. Regarding pain management, Dr. Gasper Sells states the hospital will manage the patient's pain.   Daughter verbalized understanding.

## 2021-11-17 NOTE — Plan of Care (Signed)
  Problem: Pain Management: Goal: Pain level will decrease with appropriate interventions Outcome: Progressing   Problem: Activity: Goal: Risk for activity intolerance will decrease Outcome: Progressing   Problem: Pain Managment: Goal: General experience of comfort will improve Outcome: Progressing   Problem: Safety: Goal: Ability to remain free from injury will improve Outcome: Progressing

## 2021-11-17 NOTE — Telephone Encounter (Signed)
Pt daughter calling because pt is in the hospital and states that they would like Dr. Gasper Sells to look over his chart and give some advice on managing his pain medication.

## 2021-11-17 NOTE — Progress Notes (Addendum)
11/16/21 2300  What Happened?   21:10 Patient crawled over bed rale and let himself down to the floor  Was fall witnessed? No  Was patient injured? No  Patient found on floor  Found by Staff-comment Regulatory affairs officer observed lowering the last few inches to the floor.)  Stated prior activity bathroom-unassisted  Follow Up  MD notified Dr Junious Silk  Time MD notified 2300  Family notified No - patient refusal  Additional tests Yes-comment  Simple treatment Ice  Progress note created (see row info) Yes  Adult Fall Risk Assessment  Risk Factor Category (scoring not indicated) Fall has occurred during this admission (document High fall risk)  Age 71  Fall History: Fall within 6 months prior to admission 0  Elimination; Bowel and/or Urine Incontinence 0  Elimination; Bowel and/or Urine Urgency/Frequency 2  Medications: includes PCA/Opiates, Anti-convulsants, Anti-hypertensives, Diuretics, Hypnotics, Laxatives, Sedatives, and Psychotropics 5  Patient Care Equipment 2  Mobility-Assistance 2  Mobility-Gait 2  Mobility-Sensory Deficit 0  Altered awareness of immediate physical environment 0  Impulsiveness 2  Lack of understanding of one's physical/cognitive limitations 4  Total Score 20  Patient Fall Risk Level High fall risk  Adult Fall Risk Interventions  Required Bundle Interventions *See Row Information* High fall risk - low, moderate, and high requirements implemented  Additional Interventions Camera surveillance (with patient/family notification & education);PT/OT need assessed if change in mobility from baseline;Use of appropriate toileting equipment (bedpan, BSC, etc.)  Screening for Fall Injury Risk (To be completed on HIGH fall risk patients) - Assessing Need for Floor Mats  Risk For Fall Injury- Criteria for Floor Mats Previous fall this admission;Noncompliant with safety precautions  Will Implement Floor Mats Yes  Vitals  Temp 99 F (37.2 C)  Temp Source Oral  BP (!) 141/75  MAP  (mmHg) 95  BP Location Right Arm  BP Method Automatic  Patient Position (if appropriate) Lying  Pulse Rate 68  Pulse Rate Source Monitor  Resp 20  Oxygen Therapy  SpO2 94 %  O2 Device Room Air  Pulse Oximetry Type Intermittent  Pain Assessment  Pain Scale 0-10  Pain Score 0  Pain Type Surgical pain  Pain Location Knee  Pain Orientation Right  Pain Descriptors / Indicators Aching  Pain Frequency Intermittent  Pain Onset With Activity  Patients Stated Pain Goal 3  Pain Intervention(s) Medication (See eMAR);Repositioned;Cold applied  Multiple Pain Sites No  PCA/Epidural/Spinal Assessment  Respiratory Pattern Regular;Unlabored  IS - Achieved (mL) (RN, NT, or RT) 2250 mL  Neurological  Neuro (WDL) X  Level of Consciousness Alert  Orientation Level Oriented to person;Oriented to place;Oriented to situation;Disoriented to time  Cognition Memory impairment;Follows commands  Speech Clear  R Pupil Size (mm) 3  R Pupil Shape Round  R Pupil Reaction Brisk  L Pupil Size (mm) 3  L Pupil Shape Round  L Pupil Reaction Brisk  Motor Function/Sensation Assessment Dorsiflexion;Plantar flexion;Motor response;Sensation;Motor strength;Grip  R Hand Grip Strong  L Hand Grip Strong  R Foot Dorsiflexion Strong  L Foot Dorsiflexion Strong  R Foot Plantar Flexion Strong  L Foot Plantar Flexion Strong  RUE Motor Response Purposeful movement  RUE Sensation Full sensation  RUE Motor Strength 5  LUE Motor Response Purposeful movement  LUE Sensation Full sensation  LUE Motor Strength 5  RLE Motor Response Purposeful movement  RLE Sensation Full sensation  RLE Motor Strength 4  LLE Motor Response Purposeful movement  LLE Sensation Full sensation  LLE Motor Strength 5  R Sensory  Level S1-Sole of foot, small toes  L Sensory Level S1-Sole of foot, small toes  Neuro Symptoms Forgetful  Neuro symptoms relieved by Rest;Antidepressant medication  Musculoskeletal  Musculoskeletal (WDL) X   Assistive Device Front wheel walker  Generalized Weakness Yes  Weight Bearing Restrictions No  Musculoskeletal Details  Right Knee Limited movement;Ortho/Supportive Device;Swelling;Weakness;Surgery  Right Knee Ortho/Supportive Device Ace wrap  Integumentary  Integumentary (WDL) WDL  Skin Color Appropriate for ethnicity  Skin Condition Dry  Skin Integrity Intact  Skin Turgor Non-tenting  Pain Assessment  Date Pain First Started 11/15/21  Result of Injury No  Pain Assessment  Work-Related Injury No  Pain Screening  Clinical Progression Gradually improving  Patient educated about Tele-Sitter and compliant with use of urinal 6 times with 1700 cc light yellow color clear urine from 0020-0405, and able to return to sleep after each use.  X-Ray results negative for injury post fall.  Patient agrees to use call button, but calls out verbally for assistance.

## 2021-11-18 MED ORDER — FONDAPARINUX SODIUM 2.5 MG/0.5ML ~~LOC~~ SOLN: 2.5000 mg | Freq: Every day | SUBCUTANEOUS | 0 refills | Status: DC

## 2021-11-18 MED ORDER — BUSPIRONE HCL 10 MG PO TABS: 20.0000 mg | ORAL_TABLET | Freq: Two times a day (BID) | ORAL | Status: DC

## 2021-11-18 MED ORDER — BUSPIRONE HCL 10 MG PO TABS
20.0000 mg | ORAL_TABLET | Freq: Two times a day (BID) | ORAL | Status: DC
  Filled 2021-11-18: qty 2

## 2021-11-18 MED ORDER — BUSPIRONE HCL 10 MG PO TABS
20.0000 mg | ORAL_TABLET | Freq: Two times a day (BID) | ORAL | Status: DC
  Administered 2021-11-19 (×2): 20 mg via ORAL
  Filled 2021-11-18: qty 2

## 2021-11-18 MED ORDER — ACETAMINOPHEN 500 MG PO TABS: 500.0000 mg | ORAL_TABLET | Freq: Four times a day (QID) | ORAL | 0 refills | Status: AC | PRN

## 2021-11-18 MED ORDER — METHOCARBAMOL 500 MG PO TABS: 500.0000 mg | ORAL_TABLET | Freq: Four times a day (QID) | ORAL | 0 refills | Status: DC | PRN

## 2021-11-18 NOTE — Progress Notes (Signed)
Physical Therapy Treatment Patient Details Name: [name] MRN: [id number] DOB: April 23, 1950 Today's Date: 11/18/2021   History of Present Illness Pt is a 71yo male presenting s/p R-TKA on 11/15/21. PMH: CHF, OA, depression, GERD, herpes, HLD, HTN, memory loss, hx of stroke, AKI, depression, tardive dyskinesia,    PT Comments    Pt continues very cooperative but impulsive and progressing with mobility but limited by pain and c/o fatigue "I feel like I might pass out" - BP 118/70.  This date, pt performed therex program and assisted to ambulate to/from bathroom for toileting and standing at sink to perform hand hygiene.  Follow up rehab at SNF level remains very appropriate.   Recommendations for follow up therapy are one component of a multi-disciplinary discharge planning process, led by the attending physician.  Recommendations may be updated based on patient status, additional functional criteria and insurance authorization.  Follow Up Recommendations  Skilled nursing-short term rehab (<3 hours/day) Can patient physically be transported by private vehicle: Yes   Assistance Recommended at Discharge Frequent or constant Supervision/Assistance  Patient can return home with the following A little help with walking and/or transfers;A little help with bathing/dressing/bathroom;Assistance with cooking/housework;Assist for transportation;Help with stairs or ramp for entrance   Equipment Recommendations  Rolling walker (2 wheels)    Recommendations for Other Services       Precautions / Restrictions Precautions Precautions: Knee Precaution Comments: no pillow under the knee Restrictions Weight Bearing Restrictions: No Other Position/Activity Restrictions: wbat     Mobility  Bed Mobility Overal bed mobility: Needs Assistance Bed Mobility: Supine to Sit     Supine to sit: HOB elevated, Min assist     General bed mobility comments: Required min assist to guide B LE off of bed  also used safety belt assist to guide R LE off of bed.    Transfers Overall transfer level: Needs assistance Equipment used: Rolling walker (2 wheels) Transfers: Sit to/from Stand Sit to Stand: Min assist           General transfer comment: Assist needed to power up from bed and to maintain initial balance. Cues needed for safe hand placement.    Ambulation/Gait Ambulation/Gait assistance: Min assist, Mod assist Gait Distance (Feet): 15 Feet (15' twice; to/from bathroom) Assistive device: Rolling walker (2 wheels) Gait Pattern/deviations: Step-to pattern, Antalgic, Decreased step length - right, Decreased stance time - right, Shuffle, Trunk flexed Gait velocity: decreased     General Gait Details: step-by-step cues for sequence, posture and position from RW; assist to stability and RW management   Stairs             Wheelchair Mobility    Modified Rankin (Stroke Patients Only)       Balance Overall balance assessment: Needs assistance Sitting-balance support: Feet supported, No upper extremity supported Sitting balance-Leahy Scale: Good     Standing balance support: Reliant on assistive device for balance, During functional activity, Bilateral upper extremity supported Standing balance-Leahy Scale: Poor                              Cognition Arousal/Alertness: Awake/alert Behavior During Therapy: WFL for tasks assessed/performed Overall Cognitive Status: Within Functional Limits for tasks assessed  Exercises Total Joint Exercises Ankle Circles/Pumps: AROM, Both, 10 reps Quad Sets: AROM, Both, 10 reps Heel Slides: AAROM, Right, 15 reps, Supine Hip ABduction/ADduction: AAROM, Right, 10 reps Straight Leg Raises: AAROM, Right, 10 reps Goniometric ROM: -10 - 75    General Comments        Pertinent Vitals/Pain Pain Assessment Pain Assessment: Faces Faces Pain Scale: Hurts even  more Pain Location: right knee Pain Descriptors / Indicators: Operative site guarding, Discomfort, Sore Pain Intervention(s): Limited activity within patient's tolerance, Monitored during session, Premedicated before session, Ice applied    Home Living                          Prior Function            PT Goals (current goals can now be found in the care plan section) Acute Rehab PT Goals Patient Stated Goal: To return to work and play golf PT Goal Formulation: With patient Time For Goal Achievement: 11/22/21 Potential to Achieve Goals: Good Progress towards PT goals: Progressing toward goals    Frequency    7X/week      PT Plan Current plan remains appropriate    Co-evaluation              AM-PAC PT "6 Clicks" Mobility   Outcome Measure  Help needed turning from your back to your side while in a flat bed without using bedrails?: A Little Help needed moving from lying on your back to sitting on the side of a flat bed without using bedrails?: A Little Help needed moving to and from a bed to a chair (including a wheelchair)?: A Little Help needed standing up from a chair using your arms (e.g., wheelchair or bedside chair)?: A Little Help needed to walk in hospital room?: A Little Help needed climbing 3-5 steps with a railing? : A Lot 6 Click Score: 17    End of Session Equipment Utilized During Treatment: Gait belt Activity Tolerance: Patient limited by fatigue;Other (comment) ("I feel like I might pass out) Patient left: in chair;with call bell/phone within reach;with nursing/sitter in room Nurse Communication: Mobility status PT Visit Diagnosis: Pain;Difficulty in walking, not elsewhere classified (R26.2) Pain - Right/Left: Right Pain - part of body: Knee     Time: 0177-9390 PT Time Calculation (min) (ACUTE ONLY): 45 min  Charges:  $Gait Training: 8-22 mins $Therapeutic Exercise: 8-22 mins $Therapeutic Activity: 8-22 mins                      Debe Coder PT Acute Rehabilitation Services Pager [phone] Office [phone]    [name] 11/18/2021, 12:25 PM

## 2021-11-18 NOTE — Discharge Summary (Addendum)
Physician Discharge Summary   Patient ID: [name] MRN: [id number] DOB/AGE: 05/07/1950 71 y.o.  Admit date: 11/15/2021 Discharge date: 11/19/2021  Primary Diagnosis: Osteoarthritis, right knee   Admission Diagnoses:  Past Medical History:  Diagnosis Date   (HFpEF) heart failure with preserved ejection fraction (HCC)    Arthritis    Depression    Frequent urination    GERD (gastroesophageal reflux disease)    Headache    mirgaines in the past   Herpes    History of nuclear stress test    Myoview 05/2021: EF 77, normal perfusion, low risk   Hyperlipidemia    Hypertension    Memory loss    Obesity    Stroke ([place])    Urgency of urination    Discharge Diagnoses:   Principal Problem:   OA (osteoarthritis) of knee Active Problems:   Primary osteoarthritis of right knee  Estimated body mass index is 29.32 kg/m as calculated from the following:   Height as of this encounter: '5\' 7"'$  (1.702 m).   Weight as of this encounter: 84.9 kg.  Procedure:  Procedure(s) (LRB): TOTAL KNEE ARTHROPLASTY (Right)   Consults: None  HPI: [name] is a 71 y.o. year old male with end stage OA of his right knee with progressively worsening pain and dysfunction. He has constant pain, with activity and at rest and significant functional deficits with difficulties even with ADLs. He has had extensive non-op management including analgesics, injections of cortisone and viscosupplements, and home exercise program, but remains in significant pain with significant dysfunction. Radiographs show bone on bone arthritis bone on bone lateral and patellofemoral with valgus deformity. He presents now for right Total Knee Arthroplasty.      Laboratory Data: Admission on 11/15/2021  Component Date Value Ref Range Status   WBC 11/16/2021 10.0  4.0 - 10.5 K/uL Final   RBC 11/16/2021 2.87 (L)  4.22 - 5.81 MIL/uL Final   Hemoglobin 11/16/2021 10.0 (L)  13.0 - 17.0 g/dL Final   HCT 11/16/2021 29.0 (L)   39.0 - 52.0 % Final   MCV 11/16/2021 101.0 (H)  80.0 - 100.0 fL Final   MCH 11/16/2021 34.8 (H)  26.0 - 34.0 pg Final   MCHC 11/16/2021 34.5  30.0 - 36.0 g/dL Final   RDW 11/16/2021 11.9  11.5 - 15.5 % Final   Platelets 11/16/2021 269  150 - 400 K/uL Final   nRBC 11/16/2021 0.0  0.0 - 0.2 % Final   Performed at [facility], [place] [address], [place], [place] [address]   Sodium 11/16/2021 135  135 - 145 mmol/L Final   Potassium 11/16/2021 3.7  3.5 - 5.1 mmol/L Final   Chloride 11/16/2021 106  98 - 111 mmol/L Final   CO2 11/16/2021 24  22 - 32 mmol/L Final   Glucose, Bld 11/16/2021 112 (H)  70 - 99 mg/dL Final   Glucose reference range applies only to samples taken after fasting for at least 8 hours.   BUN 11/16/2021 12  8 - 23 mg/dL Final   Creatinine, Ser 11/16/2021 1.21  0.61 - 1.24 mg/dL Final   Calcium 11/16/2021 7.7 (L)  8.9 - 10.3 mg/dL Final   GFR, Estimated 11/16/2021 >60  >60 mL/min Final   Comment: (NOTE) Calculated using the CKD-EPI Creatinine Equation (2021)    Anion gap 11/16/2021 5  5 - 15 Final   Performed at [facility], [place] [address], [place], [place] [address]   WBC  11/17/2021 10.7 (H)  4.0 - 10.5 K/uL Final   RBC 11/17/2021 2.99 (L)  4.22 - 5.81 MIL/uL Final   Hemoglobin 11/17/2021 10.3 (L)  13.0 - 17.0 g/dL Final   HCT 11/17/2021 30.2 (L)  39.0 - 52.0 % Final   MCV 11/17/2021 101.0 (H)  80.0 - 100.0 fL Final   MCH 11/17/2021 34.4 (H)  26.0 - 34.0 pg Final   MCHC 11/17/2021 34.1  30.0 - 36.0 g/dL Final   RDW 11/17/2021 12.2  11.5 - 15.5 % Final   Platelets 11/17/2021 275  150 - 400 K/uL Final   nRBC 11/17/2021 0.0  0.0 - 0.2 % Final   Performed at [facility], [place] [address], [place], [place] [address]  Hospital Outpatient Visit on 11/02/2021  Component Date Value Ref Range Status   MRSA, PCR 11/02/2021 NEGATIVE  NEGATIVE Final   Staphylococcus aureus 11/02/2021 NEGATIVE  NEGATIVE Final   Comment:  (NOTE) The Xpert SA Assay (FDA approved for NASAL specimens in patients 42 years of age and older), is one component of a comprehensive surveillance program. It is not intended to diagnose infection nor to guide or monitor treatment. Performed at [facility], [place] [address] [place], [place] [address]    Sodium 11/02/2021 140  135 - 145 mmol/L Final   Potassium 11/02/2021 3.9  3.5 - 5.1 mmol/L Final   Chloride 11/02/2021 107  98 - 111 mmol/L Final   CO2 11/02/2021 27  22 - 32 mmol/L Final   Glucose, Bld 11/02/2021 88  70 - 99 mg/dL Final   Glucose reference range applies only to samples taken after fasting for at least 8 hours.   BUN 11/02/2021 12  8 - 23 mg/dL Final   Creatinine, Ser 11/02/2021 1.19  0.61 - 1.24 mg/dL Final   Calcium 11/02/2021 9.0  8.9 - 10.3 mg/dL Final   GFR, Estimated 11/02/2021 >60  >60 mL/min Final   Comment: (NOTE) Calculated using the CKD-EPI Creatinine Equation (2021)    Anion gap 11/02/2021 6  5 - 15 Final   Performed at [facility], [place] [address] [place], [place] [address]   WBC 11/02/2021 6.4  4.0 - 10.5 K/uL Final   RBC 11/02/2021 3.42 (L)  4.22 - 5.81 MIL/uL Final   Hemoglobin 11/02/2021 11.8 (L)  13.0 - 17.0 g/dL Final   HCT 11/02/2021 35.1 (L)  39.0 - 52.0 % Final   MCV 11/02/2021 102.6 (H)  80.0 - 100.0 fL Final   MCH 11/02/2021 34.5 (H)  26.0 - 34.0 pg Final   MCHC 11/02/2021 33.6  30.0 - 36.0 g/dL Final   RDW 11/02/2021 12.5  11.5 - 15.5 % Final   Platelets 11/02/2021 349  150 - 400 K/uL Final   nRBC 11/02/2021 0.0  0.0 - 0.2 % Final   Performed at [facility], [place] [address] [place], [place] [address]     X-Rays:DG Knee Right Port  Result Date: 11/16/2021 CLINICAL DATA:  [id number]. Right knee pain. Total knee arthroplasty done yesterday. [place] x2 today. EXAM: PORTABLE RIGHT KNEE - 1-2 VIEW COMPARISON:  None Available. FINDINGS: There is patchy soft tissue gas in the medial distal  right thigh extending to the level of the joint, scattered subcutaneous air pockets anteriorly, soft tissue swelling anteriorly. Findings could be simply due to postoperative air in the soft tissues. Please correlate clinically for infectious complication. The underlying bones are intact with osteopenia. There is a total joint arthroplasty  with cemented components. There is no evidence of hardware failure or acute hardware complication, no periprosthetic fracture. Small amount of air noted in the suprapatellar bursal space. IMPRESSION: 1. Patchy soft tissue gas in the medial distal right thigh, scattered subcutaneous air pockets anteriorly with anterior soft tissue fullness. Findings likely due to postoperative air. Correlate clinically to exclude infectious complication. 2. Total joint arthroplasty with cemented components. No periprosthetic fracture. Osteopenic. Electronically Signed   By: Telford Nab M.D.   On: 11/16/2021 23:45    EKG: Orders placed or performed in visit on 05/19/21   EKG 12-Lead     Hospital Course: [name] is a 71 y.o. who was admitted to [facility]. They were brought to the operating room on 11/15/2021 and underwent Procedure(s): TOTAL KNEE ARTHROPLASTY.  Patient tolerated the procedure well and was later transferred to the recovery room and then to the orthopaedic floor for postoperative care. They were given PO and IV analgesics for pain control following their surgery. They were given 24 hours of postoperative antibiotics of  Anti-infectives (From admission, onward)    Start     Dose/Rate Route Frequency Ordered Stop   11/15/21 1800  ceFAZolin (ANCEF) IVPB 2g/100 mL premix        2 g 200 mL/hr over 30 Minutes Intravenous Every 6 hours 11/15/21 1419 11/15/21 2341   11/15/21 0930  ceFAZolin (ANCEF) IVPB 2g/100 mL premix        2 g 200 mL/hr over 30 Minutes Intravenous On call to O.R. 11/15/21 0915 11/15/21 1148      and started on DVT prophylaxis in  the form of  Arixtra .   PT and OT were ordered for total joint protocol. Discharge planning consulted to help with postop disposition and equipment needs. He had a good night on the evening of surgery. Had issues with confusion, and got out of bed twice without the help of nursing staff. On both occasions, he slid down to the ground onto his bottom and aggravated the operative knee. Nursing reported both incidents. Xray of the right knee was obtained after second incident, which showed no injury to the right knee. Social work was consulted to help with SNF placement, patient's daughter had chosen [facility] prior to surgery. Consulted with patient's cardiologist following surgery for recommendations on DVT prophylaxis. Patient chronically on Plavix but cannot tolerate ASA. Started on arixtra QD x 3 weeks, and was given okay to hold Plavix during that time. Will resume Plavix once arixtra is completed. Given patient's confusion and multiple falls, all pain medications were discontinued other than tylenol. Patient additionally has allergies to both hydrocodone and oxycodone, so once dilaudid caused confusion narcotics were stopped altogether. Patient was seen on POD #3 and was feeling well. Incision healing well with Aquacel in place. Discharged to SNF in stable condition.   Diet: Regular diet Activity: WBAT Follow-up: in 2 weeks Disposition: Skilled nursing facility Discharged Condition: stable   Discharge Instructions     Call MD / Call 911   Complete by: As directed    If you experience chest pain or shortness of breath, CALL 911 and be transported to the hospital emergency room.  If you develope a fever above 101 F, pus (white drainage) or increased drainage or redness at the wound, or calf pain, call your surgeon's office.   Change dressing   Complete by: As directed    You may remove the bulky bandage (ACE wrap and gauze) two days after surgery.  You will have an adhesive waterproof bandage  underneath. Leave this in place until your first follow-up appointment.   Constipation Prevention   Complete by: As directed    Drink plenty of fluids.  Prune juice may be helpful.  You may use a stool softener, such as Colace (over the counter) 100 mg twice a day.  Use MiraLax (over the counter) for constipation as needed.   Diet - low sodium heart healthy   Complete by: As directed    Do not put a pillow under the knee. Place it under the heel.   Complete by: As directed    Driving restrictions   Complete by: As directed    No driving for two weeks   Post-operative opioid taper instructions:   Complete by: As directed    POST-OPERATIVE OPIOID TAPER INSTRUCTIONS: It is important to wean off of your opioid medication as soon as possible. If you do not need pain medication after your surgery it is ok to stop day one. Opioids include: Codeine, Hydrocodone(Norco, Vicodin), Oxycodone(Percocet, oxycontin) and hydromorphone amongst others.  Long term and even short term use of opiods can cause: Increased pain response Dependence Constipation Depression Respiratory depression And more.  Withdrawal symptoms can include Flu like symptoms Nausea, vomiting And more Techniques to manage these symptoms Hydrate well Eat regular healthy meals Stay active Use relaxation techniques(deep breathing, meditating, yoga) Do Not substitute Alcohol to help with tapering If you have been on opioids for less than two weeks and do not have pain than it is ok to stop all together.  Plan to wean off of opioids This plan should start within one week post op of your joint replacement. Maintain the same interval or time between taking each dose and first decrease the dose.  Cut the total daily intake of opioids by one tablet each day Next start to increase the time between doses. The last dose that should be eliminated is the evening dose.      TED hose   Complete by: As directed    Use stockings (TED  hose) for three weeks on both leg(s).  You may remove them at night for sleeping.   Weight bearing as tolerated   Complete by: As directed       Allergies as of 11/19/2021       Reactions   Amitriptyline Other (See Comments)   Aspirin Other (See Comments)   Dizziness, nausea and vomiting Balance issues   Lisinopril Other (See Comments)   Critical Cough   Lithium Other (See Comments)   Critical Per pt reaction unknown   Morphine Other (See Comments)   Oxycodone Nausea And Vomiting   Oxycodone Hcl Other (See Comments)   Oxycodone-acetaminophen Nausea And Vomiting, Other (See Comments)   Also causes dizziness   Pork Allergy Other (See Comments)   Pork-derived Products Other (See Comments)   Unspecified reaction        Medication List     STOP taking these medications    clopidogrel 75 MG tablet Commonly known as: PLAVIX       TAKE these medications    acetaminophen 500 MG tablet Commonly known as: TYLENOL Take 1-2 tablets (500-1,000 mg total) by mouth every 6 (six) hours as needed for moderate pain.   amLODipine 5 MG tablet Commonly known as: NORVASC Take 1 tablet (5 mg total) by mouth daily.   Belsomra 10 MG Tabs Generic drug: Suvorexant Take 1 tablet by mouth at bedtime.   busPIRone 10  MG tablet Commonly known as: BUSPAR Take 20 mg by mouth 2 (two) times daily.   candesartan 32 MG tablet Commonly known as: ATACAND Take 32 mg by mouth daily.   desloratadine 5 MG tablet Commonly known as: CLARINEX Take 5 mg by mouth daily as needed (allergies).   flunisolide 25 MCG/ACT (0.025%) Soln Commonly known as: NASALIDE 2 sprays 2 (two) times daily.   folic acid 1 MG tablet Commonly known as: FOLVITE Take 1 mg by mouth daily.   fondaparinux 2.5 MG/0.5ML Soln injection Commonly known as: ARIXTRA Inject 0.5 mLs (2.5 mg total) into the skin daily before breakfast for 18 days. Then resume Plavix once a day.   furosemide 40 MG tablet Commonly known as:  LASIX Take 40 mg by mouth daily. What changed: Another medication with the same name was removed. Continue taking this medication, and follow the directions you see here.   hydrALAZINE 50 MG tablet Commonly known as: APRESOLINE Take 1 tablet (50 mg total) by mouth 2 (two) times daily.   methocarbamol 500 MG tablet Commonly known as: ROBAXIN Take 1 tablet (500 mg total) by mouth every 6 (six) hours as needed for muscle spasms.   methotrexate 2.5 MG tablet Commonly known as: RHEUMATREX Take 4 tabs Orally Once a week, get labs after 4 weeks and then will increase dose for 30 days   metoprolol succinate 25 MG 24 hr tablet Commonly known as: Toprol XL Take 1 tablet (25 mg total) by mouth daily.   PARoxetine 37.5 MG 24 hr tablet Commonly known as: PAXIL-CR Take 1 tablet (37.5 mg total) by mouth at bedtime.   primidone 250 MG tablet Commonly known as: MYSOLINE TAKE 1 TABLET BY MOUTH TWICE A DAY What changed: when to take this   QUEtiapine 25 MG tablet Commonly known as: SEROQUEL TAKE 3 TABLETS BY MOUTH AT BEDTIME What changed:  how much to take how to take this when to take this additional instructions   rosuvastatin 20 MG tablet Commonly known as: CRESTOR Take 20 mg by mouth daily.   sildenafil 50 MG tablet Commonly known as: VIAGRA Take 100 mg by mouth as needed for erectile dysfunction.   spironolactone 25 MG tablet Commonly known as: ALDACTONE Take 25 mg by mouth daily.   traZODone 150 MG tablet Commonly known as: DESYREL Take 1 tablet (150 mg total) by mouth at bedtime as needed. for sleep   valACYclovir 1000 MG tablet Commonly known as: VALTREX Take 1,000 mg by mouth daily.   Vitamin D (Ergocalciferol) 1.25 MG (50000 UNIT) Caps capsule Commonly known as: DRISDOL Take 50,000 Units by mouth every Saturday.               Discharge Care Instructions  (From admission, onward)           Start     Ordered   11/17/21 0000  Weight bearing as  tolerated        11/17/21 0826   11/17/21 0000  Change dressing       Comments: You may remove the bulky bandage (ACE wrap and gauze) two days after surgery. You will have an adhesive waterproof bandage underneath. Leave this in place until your first follow-up appointment.   11/17/21 0826            Follow-up Information     Gaynelle Arabian, MD. Go on 11/30/2021.   Specialty: Orthopedic Surgery Why: You are scheduled for first post op appt on Tuesday Nov 14 at 1:45pm. Contact information:  [place] [address] [phone]                 Signed: Theresa Duty, PA-C Orthopedic Surgery 11/19/2021, 7:54 AM

## 2021-11-18 NOTE — Telephone Encounter (Signed)
I returned Whitney's call. She reports pt completed total knee replacement on 11/15/2021 and 3 week course of Arixtra started.   She wanted to know if he should resume plavix after the three week course of Arixtra? Pt is to be admitted on 11/19/2021 to [facility] estimated time for rehab is 2-3 weeks.

## 2021-11-18 NOTE — Plan of Care (Signed)
  Problem: Pain Management: Goal: Pain level will decrease with appropriate interventions Outcome: Progressing   

## 2021-11-19 DIAGNOSIS — Z471 Aftercare following joint replacement surgery: Secondary | ICD-10-CM | POA: Diagnosis not present

## 2021-11-19 DIAGNOSIS — G8918 Other acute postprocedural pain: Secondary | ICD-10-CM | POA: Diagnosis not present

## 2021-11-19 DIAGNOSIS — F339 Major depressive disorder, recurrent, unspecified: Secondary | ICD-10-CM | POA: Diagnosis not present

## 2021-11-19 DIAGNOSIS — R296 Repeated falls: Secondary | ICD-10-CM | POA: Diagnosis not present

## 2021-11-19 DIAGNOSIS — F331 Major depressive disorder, recurrent, moderate: Secondary | ICD-10-CM | POA: Diagnosis not present

## 2021-11-19 DIAGNOSIS — I5032 Chronic diastolic (congestive) heart failure: Secondary | ICD-10-CM | POA: Diagnosis not present

## 2021-11-19 DIAGNOSIS — Z7401 Bed confinement status: Secondary | ICD-10-CM | POA: Diagnosis not present

## 2021-11-19 DIAGNOSIS — E669 Obesity, unspecified: Secondary | ICD-10-CM | POA: Diagnosis not present

## 2021-11-19 DIAGNOSIS — Z7409 Other reduced mobility: Secondary | ICD-10-CM | POA: Diagnosis not present

## 2021-11-19 DIAGNOSIS — R413 Other amnesia: Secondary | ICD-10-CM | POA: Diagnosis not present

## 2021-11-19 DIAGNOSIS — M1711 Unilateral primary osteoarthritis, right knee: Secondary | ICD-10-CM | POA: Diagnosis not present

## 2021-11-19 DIAGNOSIS — E782 Mixed hyperlipidemia: Secondary | ICD-10-CM | POA: Diagnosis not present

## 2021-11-19 DIAGNOSIS — F419 Anxiety disorder, unspecified: Secondary | ICD-10-CM | POA: Diagnosis not present

## 2021-11-19 DIAGNOSIS — Z7901 Long term (current) use of anticoagulants: Secondary | ICD-10-CM | POA: Diagnosis not present

## 2021-11-19 DIAGNOSIS — M62838 Other muscle spasm: Secondary | ICD-10-CM | POA: Diagnosis not present

## 2021-11-19 DIAGNOSIS — F32A Depression, unspecified: Secondary | ICD-10-CM | POA: Diagnosis not present

## 2021-11-19 DIAGNOSIS — F411 Generalized anxiety disorder: Secondary | ICD-10-CM | POA: Diagnosis not present

## 2021-11-19 DIAGNOSIS — Z96651 Presence of right artificial knee joint: Secondary | ICD-10-CM | POA: Diagnosis not present

## 2021-11-19 DIAGNOSIS — R251 Tremor, unspecified: Secondary | ICD-10-CM | POA: Diagnosis not present

## 2021-11-19 DIAGNOSIS — I110 Hypertensive heart disease with heart failure: Secondary | ICD-10-CM | POA: Diagnosis not present

## 2021-11-19 DIAGNOSIS — B009 Herpesviral infection, unspecified: Secondary | ICD-10-CM | POA: Diagnosis not present

## 2021-11-19 DIAGNOSIS — G43909 Migraine, unspecified, not intractable, without status migrainosus: Secondary | ICD-10-CM | POA: Diagnosis not present

## 2021-11-19 DIAGNOSIS — Z8673 Personal history of transient ischemic attack (TIA), and cerebral infarction without residual deficits: Secondary | ICD-10-CM | POA: Diagnosis not present

## 2021-11-19 DIAGNOSIS — R2689 Other abnormalities of gait and mobility: Secondary | ICD-10-CM | POA: Diagnosis not present

## 2021-11-19 DIAGNOSIS — K219 Gastro-esophageal reflux disease without esophagitis: Secondary | ICD-10-CM | POA: Diagnosis not present

## 2021-11-19 DIAGNOSIS — R531 Weakness: Secondary | ICD-10-CM | POA: Diagnosis not present

## 2021-11-19 DIAGNOSIS — M25561 Pain in right knee: Secondary | ICD-10-CM | POA: Diagnosis not present

## 2021-11-19 DIAGNOSIS — F3342 Major depressive disorder, recurrent, in full remission: Secondary | ICD-10-CM | POA: Diagnosis not present

## 2021-11-19 NOTE — Care Management Important Message (Signed)
Important Message  Patient Details IM Letter given. Name: [name] MRN: [id number] Date of Birth: [birth year]   Medicare Important Message Given:  Yes     Kerin Salen 11/19/2021, 10:05 AM

## 2021-11-19 NOTE — TOC Transition Note (Signed)
Transition of Care [facility]) - CM/SW Discharge Note   Patient Details  Name: BRITT PETRONI MRN: [id number] Date of Birth: 1950/09/03  Transition of Care [facility]) CM/SW Contact:  Lennart Pall, LCSW Phone Number: 11/19/2021, 3:02 PM   Clinical Narrative:    Pt is medically cleared for dc today to SNF and bed accepted at [facility] who can admit.  Pt and daughter agreeable.  PTAR called at 3pm.  RN to call report to [phone].   No further TOC needs.   Final next level of care: Skilled Nursing Facility Barriers to Discharge: Barriers Resolved   Patient Goals and CMS Choice Patient states their goals for this hospitalization and ongoing recovery are:: [place] following SNF rehab      Discharge Placement   Existing PASRR number confirmed : 11/16/21          Patient chooses bed at: [facility] Patient to be transferred to facility by: [facility] Name of family member notified: daughter Patient and family notified of of transfer: 11/19/21  Discharge Plan and Services In-house Referral: Clinical Social Work   Post Acute Care Choice: [facility]          DME Arranged: N/A                    Social Determinants of Health ([place]) Interventions     Readmission Risk Interventions     No data to display

## 2021-11-19 NOTE — Plan of Care (Signed)
  Problem: Education: Goal: Knowledge of the prescribed therapeutic regimen will improve Outcome: Progressing   Problem: Activity: Goal: Range of joint motion will improve Outcome: Progressing   Problem: Pain Managment: Goal: General experience of comfort will improve Outcome: Progressing   Problem: Safety: Goal: Ability to remain free from injury will improve Outcome: Progressing

## 2021-11-19 NOTE — Progress Notes (Signed)
Discharge instructions explained to patient's daughter; instructions in packet for SNF; two attempts to call report to [facility] SNF with no response either time; left message on voicemail with no return call; awaiting transport by [facility]

## 2021-11-19 NOTE — Plan of Care (Signed)
  Problem: Education: Goal: Knowledge of the prescribed therapeutic regimen will improve Outcome: Adequate for Discharge Goal: Individualized Educational Video(s) Outcome: Adequate for Discharge   Problem: Activity: Goal: Ability to avoid complications of mobility impairment will improve Outcome: Adequate for Discharge Goal: Range of joint motion will improve Outcome: Adequate for Discharge   Problem: Clinical Measurements: Goal: Postoperative complications will be avoided or minimized Outcome: Adequate for Discharge   Problem: Pain Management: Goal: Pain level will decrease with appropriate interventions Outcome: Adequate for Discharge   Problem: Skin Integrity: Goal: Will show signs of wound healing Outcome: Adequate for Discharge   Problem: Education: Goal: Knowledge of General Education information will improve Description: Including pain rating scale, medication(s)/side effects and non-pharmacologic comfort measures Outcome: Adequate for Discharge   Problem: Health Behavior/Discharge Planning: Goal: Ability to manage health-related needs will improve Outcome: Adequate for Discharge   Problem: Clinical Measurements: Goal: Ability to maintain clinical measurements within normal limits will improve Outcome: Adequate for Discharge Goal: Will remain free from infection Outcome: Adequate for Discharge Goal: Diagnostic test results will improve Outcome: Adequate for Discharge Goal: Respiratory complications will improve Outcome: Adequate for Discharge Goal: Cardiovascular complication will be avoided Outcome: Adequate for Discharge   Problem: Activity: Goal: Risk for activity intolerance will decrease Outcome: Adequate for Discharge   Problem: Nutrition: Goal: Adequate nutrition will be maintained Outcome: Adequate for Discharge   Problem: Coping: Goal: Level of anxiety will decrease Outcome: Adequate for Discharge   Problem: Elimination: Goal: Will not  experience complications related to bowel motility Outcome: Adequate for Discharge Goal: Will not experience complications related to urinary retention Outcome: Adequate for Discharge   Problem: Pain Managment: Goal: General experience of comfort will improve Outcome: Adequate for Discharge   Problem: Safety: Goal: Ability to remain free from injury will improve Outcome: Adequate for Discharge   Problem: Skin Integrity: Goal: Risk for impaired skin integrity will decrease Outcome: Adequate for Discharge   

## 2021-11-19 NOTE — Progress Notes (Signed)
Physical Therapy Treatment Patient Details Name: [name] MRN: [id number] DOB: 28-Dec-1950 Today's Date: 11/19/2021   History of Present Illness Pt is a 71yo male presenting s/p R-TKA on 11/15/21. PMH: CHF, OA, depression, GERD, herpes, HLD, HTN, memory loss, hx of stroke, AKI, depression, tardive dyskinesia,    PT Comments    Pt just back to bed from [facility] and states too fatigued but agreeable to therex program.  Pt performed therex with assist and with increased cues/stimulation to focus on task and participate.   Recommendations for follow up therapy are one component of a multi-disciplinary discharge planning process, led by the attending physician.  Recommendations may be updated based on patient status, additional functional criteria and insurance authorization.  Follow Up Recommendations  Skilled nursing-short term rehab (<3 hours/day) Can patient physically be transported by private vehicle: Yes   Assistance Recommended at Discharge Frequent or constant Supervision/Assistance  Patient can return home with the following A little help with walking and/or transfers;A little help with bathing/dressing/bathroom;Assistance with cooking/housework;Assist for transportation;Help with stairs or ramp for entrance   Equipment Recommendations  Rolling walker (2 wheels)    Recommendations for Other Services       Precautions / Restrictions Precautions Precautions: Knee Precaution Comments: no pillow under the knee Restrictions Weight Bearing Restrictions: No Other Position/Activity Restrictions: wbat     Mobility  Bed Mobility               General bed mobility comments: OOB deferred - pt states just back from [facility] and "too tired"    Transfers                        Ambulation/Gait                   Stairs             Wheelchair Mobility    Modified Rankin (Stroke Patients Only)       Balance                                             Cognition Arousal/Alertness: Awake/alert Behavior During Therapy: WFL for tasks assessed/performed Overall Cognitive Status: Within Functional Limits for tasks assessed                                 General Comments: Drowsy and requiring increased input to focus on task        Exercises Total Joint Exercises Ankle Circles/Pumps: AROM, Both, 10 reps Quad Sets: AROM, Both, 10 reps Heel Slides: AAROM, Right, 15 reps, Supine Hip ABduction/ADduction: AAROM, Right, 10 reps Straight Leg Raises: AAROM, Right, 10 reps Goniometric ROM: -8 - 75    General Comments        Pertinent Vitals/Pain Pain Assessment Pain Assessment: 0-10 Pain Score: 6  Pain Location: right knee Pain Descriptors / Indicators: Operative site guarding, Discomfort, Sore Pain Intervention(s): Limited activity within patient's tolerance, Monitored during session, Premedicated before session, Ice applied    Home Living                          Prior Function            PT Goals (current goals can now be found in the  care plan section) Acute Rehab PT Goals Patient Stated Goal: To return to work and play golf PT Goal Formulation: With patient Time For Goal Achievement: 11/22/21 Potential to Achieve Goals: Good Progress towards PT goals: Progressing toward goals    Frequency    7X/week      PT Plan Current plan remains appropriate    Co-evaluation              AM-PAC PT "6 Clicks" Mobility   Outcome Measure  Help needed turning from your back to your side while in a flat bed without using bedrails?: A Little Help needed moving from lying on your back to sitting on the side of a flat bed without using bedrails?: A Little Help needed moving to and from a bed to a chair (including a wheelchair)?: A Little Help needed standing up from a chair using your arms (e.g., wheelchair or bedside chair)?: A Little Help needed to walk in hospital room?: A  Little Help needed climbing 3-5 steps with a railing? : A Lot 6 Click Score: 17    End of Session   Activity Tolerance: Patient limited by fatigue Patient left: in bed;with call bell/phone within reach;with bed alarm set Nurse Communication: Mobility status PT Visit Diagnosis: Pain;Difficulty in walking, not elsewhere classified (R26.2) Pain - Right/Left: Right Pain - part of body: Knee     Time: 1003-1019 PT Time Calculation (min) (ACUTE ONLY): 16 min  Charges:  $Therapeutic Exercise: 8-22 mins                     Debe Coder PT Acute Rehabilitation Services Pager [phone] Office [phone]    Toshiro Hanken 11/19/2021, 11:28 AM

## 2021-11-19 NOTE — Progress Notes (Signed)
Pt's daughter was very angry and using foul language to the RN about pt buspar dosage.  Pt was ordered buspar '10mg'$  daily as it was listed in the pts med list, but pt daughter states "my father should be taking '20mg'$  twice daily. Called pharmacy to come and speak with the pt. Pharmacy changed the med list per pts daughter request. Called the oncall PA to notified about the change. Pt was given 20 mg at midnight. RN spent the majority of the first four hour of shift resolving the issue.

## 2021-11-22 DIAGNOSIS — R251 Tremor, unspecified: Secondary | ICD-10-CM | POA: Diagnosis not present

## 2021-11-22 DIAGNOSIS — F339 Major depressive disorder, recurrent, unspecified: Secondary | ICD-10-CM | POA: Diagnosis not present

## 2021-11-22 DIAGNOSIS — M1711 Unilateral primary osteoarthritis, right knee: Secondary | ICD-10-CM | POA: Diagnosis not present

## 2021-11-22 DIAGNOSIS — R2689 Other abnormalities of gait and mobility: Secondary | ICD-10-CM | POA: Diagnosis not present

## 2021-11-22 DIAGNOSIS — F419 Anxiety disorder, unspecified: Secondary | ICD-10-CM | POA: Diagnosis not present

## 2021-11-22 DIAGNOSIS — Z96651 Presence of right artificial knee joint: Secondary | ICD-10-CM | POA: Diagnosis not present

## 2021-11-22 DIAGNOSIS — Z471 Aftercare following joint replacement surgery: Secondary | ICD-10-CM | POA: Diagnosis not present

## 2021-11-22 DIAGNOSIS — I5032 Chronic diastolic (congestive) heart failure: Secondary | ICD-10-CM | POA: Diagnosis not present

## 2021-11-22 NOTE — Telephone Encounter (Addendum)
Dr. [name] agreed to plan, I have updated daughter and faxed plan to [facility] Attention Whitney/Francico.  Fax [phone]

## 2021-11-25 DIAGNOSIS — F32A Depression, unspecified: Secondary | ICD-10-CM | POA: Diagnosis not present

## 2021-11-25 DIAGNOSIS — M62838 Other muscle spasm: Secondary | ICD-10-CM | POA: Diagnosis not present

## 2021-11-25 DIAGNOSIS — M25561 Pain in right knee: Secondary | ICD-10-CM | POA: Diagnosis not present

## 2021-11-25 DIAGNOSIS — G8918 Other acute postprocedural pain: Secondary | ICD-10-CM | POA: Diagnosis not present

## 2021-11-25 DIAGNOSIS — Z7409 Other reduced mobility: Secondary | ICD-10-CM | POA: Diagnosis not present

## 2021-11-30 ENCOUNTER — Other Ambulatory Visit: Payer: Self-pay | Admitting: Adult Health

## 2021-11-30 DIAGNOSIS — G250 Essential tremor: Secondary | ICD-10-CM

## 2021-12-02 DIAGNOSIS — Z7409 Other reduced mobility: Secondary | ICD-10-CM | POA: Diagnosis not present

## 2021-12-02 DIAGNOSIS — M25561 Pain in right knee: Secondary | ICD-10-CM | POA: Diagnosis not present

## 2021-12-02 DIAGNOSIS — G8918 Other acute postprocedural pain: Secondary | ICD-10-CM | POA: Diagnosis not present

## 2021-12-06 DIAGNOSIS — R4789 Other speech disturbances: Secondary | ICD-10-CM | POA: Diagnosis not present

## 2021-12-06 DIAGNOSIS — R278 Other lack of coordination: Secondary | ICD-10-CM | POA: Diagnosis not present

## 2021-12-06 DIAGNOSIS — Z471 Aftercare following joint replacement surgery: Secondary | ICD-10-CM | POA: Diagnosis not present

## 2021-12-06 DIAGNOSIS — Z7689 Persons encountering health services in other specified circumstances: Secondary | ICD-10-CM | POA: Diagnosis not present

## 2021-12-06 DIAGNOSIS — Z6832 Body mass index (BMI) 32.0-32.9, adult: Secondary | ICD-10-CM | POA: Diagnosis not present

## 2021-12-06 DIAGNOSIS — R471 Dysarthria and anarthria: Secondary | ICD-10-CM | POA: Diagnosis not present

## 2021-12-06 DIAGNOSIS — R488 Other symbolic dysfunctions: Secondary | ICD-10-CM | POA: Diagnosis not present

## 2021-12-06 DIAGNOSIS — R262 Difficulty in walking, not elsewhere classified: Secondary | ICD-10-CM | POA: Diagnosis not present

## 2021-12-07 DIAGNOSIS — R4789 Other speech disturbances: Secondary | ICD-10-CM | POA: Diagnosis not present

## 2021-12-07 DIAGNOSIS — R278 Other lack of coordination: Secondary | ICD-10-CM | POA: Diagnosis not present

## 2021-12-07 DIAGNOSIS — R488 Other symbolic dysfunctions: Secondary | ICD-10-CM | POA: Diagnosis not present

## 2021-12-07 DIAGNOSIS — Z471 Aftercare following joint replacement surgery: Secondary | ICD-10-CM | POA: Diagnosis not present

## 2021-12-07 DIAGNOSIS — R262 Difficulty in walking, not elsewhere classified: Secondary | ICD-10-CM | POA: Diagnosis not present

## 2021-12-07 DIAGNOSIS — R471 Dysarthria and anarthria: Secondary | ICD-10-CM | POA: Diagnosis not present

## 2021-12-08 DIAGNOSIS — R278 Other lack of coordination: Secondary | ICD-10-CM | POA: Diagnosis not present

## 2021-12-08 DIAGNOSIS — R4789 Other speech disturbances: Secondary | ICD-10-CM | POA: Diagnosis not present

## 2021-12-08 DIAGNOSIS — R262 Difficulty in walking, not elsewhere classified: Secondary | ICD-10-CM | POA: Diagnosis not present

## 2021-12-08 DIAGNOSIS — Z471 Aftercare following joint replacement surgery: Secondary | ICD-10-CM | POA: Diagnosis not present

## 2021-12-08 DIAGNOSIS — R471 Dysarthria and anarthria: Secondary | ICD-10-CM | POA: Diagnosis not present

## 2021-12-08 DIAGNOSIS — R488 Other symbolic dysfunctions: Secondary | ICD-10-CM | POA: Diagnosis not present

## 2021-12-10 DIAGNOSIS — F331 Major depressive disorder, recurrent, moderate: Secondary | ICD-10-CM | POA: Diagnosis not present

## 2021-12-10 DIAGNOSIS — F411 Generalized anxiety disorder: Secondary | ICD-10-CM | POA: Diagnosis not present

## 2021-12-10 DIAGNOSIS — Z471 Aftercare following joint replacement surgery: Secondary | ICD-10-CM | POA: Diagnosis not present

## 2021-12-10 DIAGNOSIS — R278 Other lack of coordination: Secondary | ICD-10-CM | POA: Diagnosis not present

## 2021-12-10 DIAGNOSIS — R488 Other symbolic dysfunctions: Secondary | ICD-10-CM | POA: Diagnosis not present

## 2021-12-10 DIAGNOSIS — R4789 Other speech disturbances: Secondary | ICD-10-CM | POA: Diagnosis not present

## 2021-12-10 DIAGNOSIS — R471 Dysarthria and anarthria: Secondary | ICD-10-CM | POA: Diagnosis not present

## 2021-12-10 DIAGNOSIS — R262 Difficulty in walking, not elsewhere classified: Secondary | ICD-10-CM | POA: Diagnosis not present

## 2021-12-13 ENCOUNTER — Telehealth (HOSPITAL_COMMUNITY): Payer: Self-pay | Admitting: *Deleted

## 2021-12-13 DIAGNOSIS — R488 Other symbolic dysfunctions: Secondary | ICD-10-CM | POA: Diagnosis not present

## 2021-12-13 DIAGNOSIS — Z471 Aftercare following joint replacement surgery: Secondary | ICD-10-CM | POA: Diagnosis not present

## 2021-12-13 DIAGNOSIS — R278 Other lack of coordination: Secondary | ICD-10-CM | POA: Diagnosis not present

## 2021-12-13 DIAGNOSIS — R262 Difficulty in walking, not elsewhere classified: Secondary | ICD-10-CM | POA: Diagnosis not present

## 2021-12-13 DIAGNOSIS — R471 Dysarthria and anarthria: Secondary | ICD-10-CM | POA: Diagnosis not present

## 2021-12-13 DIAGNOSIS — R4789 Other speech disturbances: Secondary | ICD-10-CM | POA: Diagnosis not present

## 2021-12-13 NOTE — Telephone Encounter (Signed)
Patient's daughter [name] called [facility]) question about medication please return call  @ [phone]

## 2021-12-14 DIAGNOSIS — R262 Difficulty in walking, not elsewhere classified: Secondary | ICD-10-CM | POA: Diagnosis not present

## 2021-12-14 DIAGNOSIS — R4789 Other speech disturbances: Secondary | ICD-10-CM | POA: Diagnosis not present

## 2021-12-14 DIAGNOSIS — Z471 Aftercare following joint replacement surgery: Secondary | ICD-10-CM | POA: Diagnosis not present

## 2021-12-14 DIAGNOSIS — R488 Other symbolic dysfunctions: Secondary | ICD-10-CM | POA: Diagnosis not present

## 2021-12-14 DIAGNOSIS — R471 Dysarthria and anarthria: Secondary | ICD-10-CM | POA: Diagnosis not present

## 2021-12-14 DIAGNOSIS — R278 Other lack of coordination: Secondary | ICD-10-CM | POA: Diagnosis not present

## 2021-12-15 DIAGNOSIS — R278 Other lack of coordination: Secondary | ICD-10-CM | POA: Diagnosis not present

## 2021-12-15 DIAGNOSIS — R4789 Other speech disturbances: Secondary | ICD-10-CM | POA: Diagnosis not present

## 2021-12-15 DIAGNOSIS — R262 Difficulty in walking, not elsewhere classified: Secondary | ICD-10-CM | POA: Diagnosis not present

## 2021-12-15 DIAGNOSIS — Z471 Aftercare following joint replacement surgery: Secondary | ICD-10-CM | POA: Diagnosis not present

## 2021-12-15 DIAGNOSIS — R471 Dysarthria and anarthria: Secondary | ICD-10-CM | POA: Diagnosis not present

## 2021-12-15 DIAGNOSIS — R488 Other symbolic dysfunctions: Secondary | ICD-10-CM | POA: Diagnosis not present

## 2021-12-16 DIAGNOSIS — R488 Other symbolic dysfunctions: Secondary | ICD-10-CM | POA: Diagnosis not present

## 2021-12-16 DIAGNOSIS — R4789 Other speech disturbances: Secondary | ICD-10-CM | POA: Diagnosis not present

## 2021-12-16 DIAGNOSIS — R278 Other lack of coordination: Secondary | ICD-10-CM | POA: Diagnosis not present

## 2021-12-16 DIAGNOSIS — R471 Dysarthria and anarthria: Secondary | ICD-10-CM | POA: Diagnosis not present

## 2021-12-16 DIAGNOSIS — Z471 Aftercare following joint replacement surgery: Secondary | ICD-10-CM | POA: Diagnosis not present

## 2021-12-16 DIAGNOSIS — R262 Difficulty in walking, not elsewhere classified: Secondary | ICD-10-CM | POA: Diagnosis not present

## 2021-12-17 DIAGNOSIS — M62551 Muscle wasting and atrophy, not elsewhere classified, right thigh: Secondary | ICD-10-CM | POA: Diagnosis not present

## 2021-12-17 DIAGNOSIS — R278 Other lack of coordination: Secondary | ICD-10-CM | POA: Diagnosis not present

## 2021-12-17 DIAGNOSIS — M62552 Muscle wasting and atrophy, not elsewhere classified, left thigh: Secondary | ICD-10-CM | POA: Diagnosis not present

## 2021-12-17 DIAGNOSIS — Z471 Aftercare following joint replacement surgery: Secondary | ICD-10-CM | POA: Diagnosis not present

## 2021-12-17 DIAGNOSIS — R262 Difficulty in walking, not elsewhere classified: Secondary | ICD-10-CM | POA: Diagnosis not present

## 2021-12-17 DIAGNOSIS — M62562 Muscle wasting and atrophy, not elsewhere classified, left lower leg: Secondary | ICD-10-CM | POA: Diagnosis not present

## 2021-12-17 DIAGNOSIS — M62561 Muscle wasting and atrophy, not elsewhere classified, right lower leg: Secondary | ICD-10-CM | POA: Diagnosis not present

## 2021-12-20 DIAGNOSIS — M62551 Muscle wasting and atrophy, not elsewhere classified, right thigh: Secondary | ICD-10-CM | POA: Diagnosis not present

## 2021-12-20 DIAGNOSIS — M62562 Muscle wasting and atrophy, not elsewhere classified, left lower leg: Secondary | ICD-10-CM | POA: Diagnosis not present

## 2021-12-20 DIAGNOSIS — R262 Difficulty in walking, not elsewhere classified: Secondary | ICD-10-CM | POA: Diagnosis not present

## 2021-12-20 DIAGNOSIS — M62552 Muscle wasting and atrophy, not elsewhere classified, left thigh: Secondary | ICD-10-CM | POA: Diagnosis not present

## 2021-12-20 DIAGNOSIS — R278 Other lack of coordination: Secondary | ICD-10-CM | POA: Diagnosis not present

## 2021-12-20 DIAGNOSIS — M62561 Muscle wasting and atrophy, not elsewhere classified, right lower leg: Secondary | ICD-10-CM | POA: Diagnosis not present

## 2021-12-21 DIAGNOSIS — M62552 Muscle wasting and atrophy, not elsewhere classified, left thigh: Secondary | ICD-10-CM | POA: Diagnosis not present

## 2021-12-21 DIAGNOSIS — M62551 Muscle wasting and atrophy, not elsewhere classified, right thigh: Secondary | ICD-10-CM | POA: Diagnosis not present

## 2021-12-21 DIAGNOSIS — M62562 Muscle wasting and atrophy, not elsewhere classified, left lower leg: Secondary | ICD-10-CM | POA: Diagnosis not present

## 2021-12-21 DIAGNOSIS — R262 Difficulty in walking, not elsewhere classified: Secondary | ICD-10-CM | POA: Diagnosis not present

## 2021-12-21 DIAGNOSIS — R278 Other lack of coordination: Secondary | ICD-10-CM | POA: Diagnosis not present

## 2021-12-21 DIAGNOSIS — M62561 Muscle wasting and atrophy, not elsewhere classified, right lower leg: Secondary | ICD-10-CM | POA: Diagnosis not present

## 2021-12-22 DIAGNOSIS — M62562 Muscle wasting and atrophy, not elsewhere classified, left lower leg: Secondary | ICD-10-CM | POA: Diagnosis not present

## 2021-12-22 DIAGNOSIS — M62561 Muscle wasting and atrophy, not elsewhere classified, right lower leg: Secondary | ICD-10-CM | POA: Diagnosis not present

## 2021-12-22 DIAGNOSIS — R262 Difficulty in walking, not elsewhere classified: Secondary | ICD-10-CM | POA: Diagnosis not present

## 2021-12-22 DIAGNOSIS — R278 Other lack of coordination: Secondary | ICD-10-CM | POA: Diagnosis not present

## 2021-12-22 DIAGNOSIS — M62551 Muscle wasting and atrophy, not elsewhere classified, right thigh: Secondary | ICD-10-CM | POA: Diagnosis not present

## 2021-12-22 DIAGNOSIS — M62552 Muscle wasting and atrophy, not elsewhere classified, left thigh: Secondary | ICD-10-CM | POA: Diagnosis not present

## 2021-12-23 DIAGNOSIS — M62552 Muscle wasting and atrophy, not elsewhere classified, left thigh: Secondary | ICD-10-CM | POA: Diagnosis not present

## 2021-12-23 DIAGNOSIS — M62562 Muscle wasting and atrophy, not elsewhere classified, left lower leg: Secondary | ICD-10-CM | POA: Diagnosis not present

## 2021-12-23 DIAGNOSIS — R278 Other lack of coordination: Secondary | ICD-10-CM | POA: Diagnosis not present

## 2021-12-23 DIAGNOSIS — R262 Difficulty in walking, not elsewhere classified: Secondary | ICD-10-CM | POA: Diagnosis not present

## 2021-12-23 DIAGNOSIS — M62561 Muscle wasting and atrophy, not elsewhere classified, right lower leg: Secondary | ICD-10-CM | POA: Diagnosis not present

## 2021-12-23 DIAGNOSIS — M62551 Muscle wasting and atrophy, not elsewhere classified, right thigh: Secondary | ICD-10-CM | POA: Diagnosis not present

## 2021-12-24 DIAGNOSIS — M62552 Muscle wasting and atrophy, not elsewhere classified, left thigh: Secondary | ICD-10-CM | POA: Diagnosis not present

## 2021-12-24 DIAGNOSIS — M62561 Muscle wasting and atrophy, not elsewhere classified, right lower leg: Secondary | ICD-10-CM | POA: Diagnosis not present

## 2021-12-24 DIAGNOSIS — R278 Other lack of coordination: Secondary | ICD-10-CM | POA: Diagnosis not present

## 2021-12-24 DIAGNOSIS — R262 Difficulty in walking, not elsewhere classified: Secondary | ICD-10-CM | POA: Diagnosis not present

## 2021-12-24 DIAGNOSIS — M62562 Muscle wasting and atrophy, not elsewhere classified, left lower leg: Secondary | ICD-10-CM | POA: Diagnosis not present

## 2021-12-24 DIAGNOSIS — M62551 Muscle wasting and atrophy, not elsewhere classified, right thigh: Secondary | ICD-10-CM | POA: Diagnosis not present

## 2021-12-27 DIAGNOSIS — M62561 Muscle wasting and atrophy, not elsewhere classified, right lower leg: Secondary | ICD-10-CM | POA: Diagnosis not present

## 2021-12-27 DIAGNOSIS — R059 Cough, unspecified: Secondary | ICD-10-CM | POA: Diagnosis not present

## 2021-12-27 DIAGNOSIS — M62551 Muscle wasting and atrophy, not elsewhere classified, right thigh: Secondary | ICD-10-CM | POA: Diagnosis not present

## 2021-12-27 DIAGNOSIS — M62552 Muscle wasting and atrophy, not elsewhere classified, left thigh: Secondary | ICD-10-CM | POA: Diagnosis not present

## 2021-12-27 DIAGNOSIS — J069 Acute upper respiratory infection, unspecified: Secondary | ICD-10-CM | POA: Diagnosis not present

## 2021-12-27 DIAGNOSIS — R262 Difficulty in walking, not elsewhere classified: Secondary | ICD-10-CM | POA: Diagnosis not present

## 2021-12-27 DIAGNOSIS — R278 Other lack of coordination: Secondary | ICD-10-CM | POA: Diagnosis not present

## 2021-12-27 DIAGNOSIS — M62562 Muscle wasting and atrophy, not elsewhere classified, left lower leg: Secondary | ICD-10-CM | POA: Diagnosis not present

## 2021-12-28 DIAGNOSIS — M62562 Muscle wasting and atrophy, not elsewhere classified, left lower leg: Secondary | ICD-10-CM | POA: Diagnosis not present

## 2021-12-28 DIAGNOSIS — M62551 Muscle wasting and atrophy, not elsewhere classified, right thigh: Secondary | ICD-10-CM | POA: Diagnosis not present

## 2021-12-28 DIAGNOSIS — M62552 Muscle wasting and atrophy, not elsewhere classified, left thigh: Secondary | ICD-10-CM | POA: Diagnosis not present

## 2021-12-28 DIAGNOSIS — M62561 Muscle wasting and atrophy, not elsewhere classified, right lower leg: Secondary | ICD-10-CM | POA: Diagnosis not present

## 2021-12-28 DIAGNOSIS — R278 Other lack of coordination: Secondary | ICD-10-CM | POA: Diagnosis not present

## 2021-12-28 DIAGNOSIS — R262 Difficulty in walking, not elsewhere classified: Secondary | ICD-10-CM | POA: Diagnosis not present

## 2021-12-29 DIAGNOSIS — Z5189 Encounter for other specified aftercare: Secondary | ICD-10-CM | POA: Diagnosis not present

## 2021-12-29 DIAGNOSIS — M62561 Muscle wasting and atrophy, not elsewhere classified, right lower leg: Secondary | ICD-10-CM | POA: Diagnosis not present

## 2021-12-29 DIAGNOSIS — R262 Difficulty in walking, not elsewhere classified: Secondary | ICD-10-CM | POA: Diagnosis not present

## 2021-12-29 DIAGNOSIS — M62552 Muscle wasting and atrophy, not elsewhere classified, left thigh: Secondary | ICD-10-CM | POA: Diagnosis not present

## 2021-12-29 DIAGNOSIS — M62562 Muscle wasting and atrophy, not elsewhere classified, left lower leg: Secondary | ICD-10-CM | POA: Diagnosis not present

## 2021-12-29 DIAGNOSIS — R278 Other lack of coordination: Secondary | ICD-10-CM | POA: Diagnosis not present

## 2021-12-29 DIAGNOSIS — M62551 Muscle wasting and atrophy, not elsewhere classified, right thigh: Secondary | ICD-10-CM | POA: Diagnosis not present

## 2021-12-30 DIAGNOSIS — R278 Other lack of coordination: Secondary | ICD-10-CM | POA: Diagnosis not present

## 2021-12-30 DIAGNOSIS — M62562 Muscle wasting and atrophy, not elsewhere classified, left lower leg: Secondary | ICD-10-CM | POA: Diagnosis not present

## 2021-12-30 DIAGNOSIS — M62561 Muscle wasting and atrophy, not elsewhere classified, right lower leg: Secondary | ICD-10-CM | POA: Diagnosis not present

## 2021-12-30 DIAGNOSIS — M62552 Muscle wasting and atrophy, not elsewhere classified, left thigh: Secondary | ICD-10-CM | POA: Diagnosis not present

## 2021-12-30 DIAGNOSIS — M62551 Muscle wasting and atrophy, not elsewhere classified, right thigh: Secondary | ICD-10-CM | POA: Diagnosis not present

## 2021-12-30 DIAGNOSIS — R262 Difficulty in walking, not elsewhere classified: Secondary | ICD-10-CM | POA: Diagnosis not present

## 2021-12-31 DIAGNOSIS — F411 Generalized anxiety disorder: Secondary | ICD-10-CM | POA: Diagnosis not present

## 2021-12-31 DIAGNOSIS — F331 Major depressive disorder, recurrent, moderate: Secondary | ICD-10-CM | POA: Diagnosis not present

## 2022-01-03 DIAGNOSIS — M62561 Muscle wasting and atrophy, not elsewhere classified, right lower leg: Secondary | ICD-10-CM | POA: Diagnosis not present

## 2022-01-03 DIAGNOSIS — M62562 Muscle wasting and atrophy, not elsewhere classified, left lower leg: Secondary | ICD-10-CM | POA: Diagnosis not present

## 2022-01-03 DIAGNOSIS — R262 Difficulty in walking, not elsewhere classified: Secondary | ICD-10-CM | POA: Diagnosis not present

## 2022-01-03 DIAGNOSIS — M62551 Muscle wasting and atrophy, not elsewhere classified, right thigh: Secondary | ICD-10-CM | POA: Diagnosis not present

## 2022-01-03 DIAGNOSIS — M62552 Muscle wasting and atrophy, not elsewhere classified, left thigh: Secondary | ICD-10-CM | POA: Diagnosis not present

## 2022-01-03 DIAGNOSIS — R278 Other lack of coordination: Secondary | ICD-10-CM | POA: Diagnosis not present

## 2022-01-04 DIAGNOSIS — R262 Difficulty in walking, not elsewhere classified: Secondary | ICD-10-CM | POA: Diagnosis not present

## 2022-01-04 DIAGNOSIS — R278 Other lack of coordination: Secondary | ICD-10-CM | POA: Diagnosis not present

## 2022-01-04 DIAGNOSIS — M62552 Muscle wasting and atrophy, not elsewhere classified, left thigh: Secondary | ICD-10-CM | POA: Diagnosis not present

## 2022-01-04 DIAGNOSIS — M62562 Muscle wasting and atrophy, not elsewhere classified, left lower leg: Secondary | ICD-10-CM | POA: Diagnosis not present

## 2022-01-04 DIAGNOSIS — M62551 Muscle wasting and atrophy, not elsewhere classified, right thigh: Secondary | ICD-10-CM | POA: Diagnosis not present

## 2022-01-04 DIAGNOSIS — M62561 Muscle wasting and atrophy, not elsewhere classified, right lower leg: Secondary | ICD-10-CM | POA: Diagnosis not present

## 2022-01-05 DIAGNOSIS — M62561 Muscle wasting and atrophy, not elsewhere classified, right lower leg: Secondary | ICD-10-CM | POA: Diagnosis not present

## 2022-01-05 DIAGNOSIS — R262 Difficulty in walking, not elsewhere classified: Secondary | ICD-10-CM | POA: Diagnosis not present

## 2022-01-05 DIAGNOSIS — M62562 Muscle wasting and atrophy, not elsewhere classified, left lower leg: Secondary | ICD-10-CM | POA: Diagnosis not present

## 2022-01-05 DIAGNOSIS — M62552 Muscle wasting and atrophy, not elsewhere classified, left thigh: Secondary | ICD-10-CM | POA: Diagnosis not present

## 2022-01-05 DIAGNOSIS — R278 Other lack of coordination: Secondary | ICD-10-CM | POA: Diagnosis not present

## 2022-01-05 DIAGNOSIS — M62551 Muscle wasting and atrophy, not elsewhere classified, right thigh: Secondary | ICD-10-CM | POA: Diagnosis not present

## 2022-01-06 DIAGNOSIS — M62552 Muscle wasting and atrophy, not elsewhere classified, left thigh: Secondary | ICD-10-CM | POA: Diagnosis not present

## 2022-01-06 DIAGNOSIS — M62551 Muscle wasting and atrophy, not elsewhere classified, right thigh: Secondary | ICD-10-CM | POA: Diagnosis not present

## 2022-01-06 DIAGNOSIS — R262 Difficulty in walking, not elsewhere classified: Secondary | ICD-10-CM | POA: Diagnosis not present

## 2022-01-06 DIAGNOSIS — M62561 Muscle wasting and atrophy, not elsewhere classified, right lower leg: Secondary | ICD-10-CM | POA: Diagnosis not present

## 2022-01-06 DIAGNOSIS — R278 Other lack of coordination: Secondary | ICD-10-CM | POA: Diagnosis not present

## 2022-01-06 DIAGNOSIS — M62562 Muscle wasting and atrophy, not elsewhere classified, left lower leg: Secondary | ICD-10-CM | POA: Diagnosis not present

## 2022-01-11 DIAGNOSIS — M62551 Muscle wasting and atrophy, not elsewhere classified, right thigh: Secondary | ICD-10-CM | POA: Diagnosis not present

## 2022-01-11 DIAGNOSIS — M62552 Muscle wasting and atrophy, not elsewhere classified, left thigh: Secondary | ICD-10-CM | POA: Diagnosis not present

## 2022-01-11 DIAGNOSIS — R262 Difficulty in walking, not elsewhere classified: Secondary | ICD-10-CM | POA: Diagnosis not present

## 2022-01-11 DIAGNOSIS — M62562 Muscle wasting and atrophy, not elsewhere classified, left lower leg: Secondary | ICD-10-CM | POA: Diagnosis not present

## 2022-01-11 DIAGNOSIS — R278 Other lack of coordination: Secondary | ICD-10-CM | POA: Diagnosis not present

## 2022-01-11 DIAGNOSIS — M62561 Muscle wasting and atrophy, not elsewhere classified, right lower leg: Secondary | ICD-10-CM | POA: Diagnosis not present

## 2022-01-13 DIAGNOSIS — M62561 Muscle wasting and atrophy, not elsewhere classified, right lower leg: Secondary | ICD-10-CM | POA: Diagnosis not present

## 2022-01-13 DIAGNOSIS — M62552 Muscle wasting and atrophy, not elsewhere classified, left thigh: Secondary | ICD-10-CM | POA: Diagnosis not present

## 2022-01-13 DIAGNOSIS — R278 Other lack of coordination: Secondary | ICD-10-CM | POA: Diagnosis not present

## 2022-01-13 DIAGNOSIS — M62562 Muscle wasting and atrophy, not elsewhere classified, left lower leg: Secondary | ICD-10-CM | POA: Diagnosis not present

## 2022-01-13 DIAGNOSIS — R262 Difficulty in walking, not elsewhere classified: Secondary | ICD-10-CM | POA: Diagnosis not present

## 2022-01-13 DIAGNOSIS — M62551 Muscle wasting and atrophy, not elsewhere classified, right thigh: Secondary | ICD-10-CM | POA: Diagnosis not present

## 2022-01-14 DIAGNOSIS — M62562 Muscle wasting and atrophy, not elsewhere classified, left lower leg: Secondary | ICD-10-CM | POA: Diagnosis not present

## 2022-01-14 DIAGNOSIS — M62561 Muscle wasting and atrophy, not elsewhere classified, right lower leg: Secondary | ICD-10-CM | POA: Diagnosis not present

## 2022-01-14 DIAGNOSIS — M62551 Muscle wasting and atrophy, not elsewhere classified, right thigh: Secondary | ICD-10-CM | POA: Diagnosis not present

## 2022-01-14 DIAGNOSIS — M62552 Muscle wasting and atrophy, not elsewhere classified, left thigh: Secondary | ICD-10-CM | POA: Diagnosis not present

## 2022-01-14 DIAGNOSIS — R278 Other lack of coordination: Secondary | ICD-10-CM | POA: Diagnosis not present

## 2022-01-14 DIAGNOSIS — R262 Difficulty in walking, not elsewhere classified: Secondary | ICD-10-CM | POA: Diagnosis not present

## 2022-01-18 DIAGNOSIS — R262 Difficulty in walking, not elsewhere classified: Secondary | ICD-10-CM | POA: Diagnosis not present

## 2022-01-18 DIAGNOSIS — Z471 Aftercare following joint replacement surgery: Secondary | ICD-10-CM | POA: Diagnosis not present

## 2022-01-18 DIAGNOSIS — M62552 Muscle wasting and atrophy, not elsewhere classified, left thigh: Secondary | ICD-10-CM | POA: Diagnosis not present

## 2022-01-18 DIAGNOSIS — M62561 Muscle wasting and atrophy, not elsewhere classified, right lower leg: Secondary | ICD-10-CM | POA: Diagnosis not present

## 2022-01-18 DIAGNOSIS — M62562 Muscle wasting and atrophy, not elsewhere classified, left lower leg: Secondary | ICD-10-CM | POA: Diagnosis not present

## 2022-01-18 DIAGNOSIS — M62551 Muscle wasting and atrophy, not elsewhere classified, right thigh: Secondary | ICD-10-CM | POA: Diagnosis not present

## 2022-01-20 DIAGNOSIS — M62562 Muscle wasting and atrophy, not elsewhere classified, left lower leg: Secondary | ICD-10-CM | POA: Diagnosis not present

## 2022-01-20 DIAGNOSIS — M62552 Muscle wasting and atrophy, not elsewhere classified, left thigh: Secondary | ICD-10-CM | POA: Diagnosis not present

## 2022-01-20 DIAGNOSIS — Z471 Aftercare following joint replacement surgery: Secondary | ICD-10-CM | POA: Diagnosis not present

## 2022-01-20 DIAGNOSIS — R262 Difficulty in walking, not elsewhere classified: Secondary | ICD-10-CM | POA: Diagnosis not present

## 2022-01-20 DIAGNOSIS — M62561 Muscle wasting and atrophy, not elsewhere classified, right lower leg: Secondary | ICD-10-CM | POA: Diagnosis not present

## 2022-01-20 DIAGNOSIS — M62551 Muscle wasting and atrophy, not elsewhere classified, right thigh: Secondary | ICD-10-CM | POA: Diagnosis not present

## 2022-01-21 DIAGNOSIS — M62562 Muscle wasting and atrophy, not elsewhere classified, left lower leg: Secondary | ICD-10-CM | POA: Diagnosis not present

## 2022-01-21 DIAGNOSIS — M62551 Muscle wasting and atrophy, not elsewhere classified, right thigh: Secondary | ICD-10-CM | POA: Diagnosis not present

## 2022-01-21 DIAGNOSIS — M62552 Muscle wasting and atrophy, not elsewhere classified, left thigh: Secondary | ICD-10-CM | POA: Diagnosis not present

## 2022-01-21 DIAGNOSIS — Z471 Aftercare following joint replacement surgery: Secondary | ICD-10-CM | POA: Diagnosis not present

## 2022-01-21 DIAGNOSIS — M62561 Muscle wasting and atrophy, not elsewhere classified, right lower leg: Secondary | ICD-10-CM | POA: Diagnosis not present

## 2022-01-21 DIAGNOSIS — R262 Difficulty in walking, not elsewhere classified: Secondary | ICD-10-CM | POA: Diagnosis not present

## 2022-01-24 DIAGNOSIS — M62561 Muscle wasting and atrophy, not elsewhere classified, right lower leg: Secondary | ICD-10-CM | POA: Diagnosis not present

## 2022-01-24 DIAGNOSIS — M62552 Muscle wasting and atrophy, not elsewhere classified, left thigh: Secondary | ICD-10-CM | POA: Diagnosis not present

## 2022-01-24 DIAGNOSIS — M62551 Muscle wasting and atrophy, not elsewhere classified, right thigh: Secondary | ICD-10-CM | POA: Diagnosis not present

## 2022-01-24 DIAGNOSIS — R262 Difficulty in walking, not elsewhere classified: Secondary | ICD-10-CM | POA: Diagnosis not present

## 2022-01-24 DIAGNOSIS — M62562 Muscle wasting and atrophy, not elsewhere classified, left lower leg: Secondary | ICD-10-CM | POA: Diagnosis not present

## 2022-01-24 DIAGNOSIS — Z471 Aftercare following joint replacement surgery: Secondary | ICD-10-CM | POA: Diagnosis not present

## 2022-01-25 DIAGNOSIS — R197 Diarrhea, unspecified: Secondary | ICD-10-CM | POA: Diagnosis not present

## 2022-01-26 ENCOUNTER — Other Ambulatory Visit: Payer: Self-pay | Admitting: Neurology

## 2022-01-26 DIAGNOSIS — M62561 Muscle wasting and atrophy, not elsewhere classified, right lower leg: Secondary | ICD-10-CM | POA: Diagnosis not present

## 2022-01-26 DIAGNOSIS — M62552 Muscle wasting and atrophy, not elsewhere classified, left thigh: Secondary | ICD-10-CM | POA: Diagnosis not present

## 2022-01-26 DIAGNOSIS — Z471 Aftercare following joint replacement surgery: Secondary | ICD-10-CM | POA: Diagnosis not present

## 2022-01-26 DIAGNOSIS — G250 Essential tremor: Secondary | ICD-10-CM

## 2022-01-26 DIAGNOSIS — R262 Difficulty in walking, not elsewhere classified: Secondary | ICD-10-CM | POA: Diagnosis not present

## 2022-01-26 DIAGNOSIS — M62562 Muscle wasting and atrophy, not elsewhere classified, left lower leg: Secondary | ICD-10-CM | POA: Diagnosis not present

## 2022-01-26 DIAGNOSIS — M62551 Muscle wasting and atrophy, not elsewhere classified, right thigh: Secondary | ICD-10-CM | POA: Diagnosis not present

## 2022-01-28 DIAGNOSIS — M62561 Muscle wasting and atrophy, not elsewhere classified, right lower leg: Secondary | ICD-10-CM | POA: Diagnosis not present

## 2022-01-28 DIAGNOSIS — M62562 Muscle wasting and atrophy, not elsewhere classified, left lower leg: Secondary | ICD-10-CM | POA: Diagnosis not present

## 2022-01-28 DIAGNOSIS — Z471 Aftercare following joint replacement surgery: Secondary | ICD-10-CM | POA: Diagnosis not present

## 2022-01-28 DIAGNOSIS — F411 Generalized anxiety disorder: Secondary | ICD-10-CM | POA: Diagnosis not present

## 2022-01-28 DIAGNOSIS — F331 Major depressive disorder, recurrent, moderate: Secondary | ICD-10-CM | POA: Diagnosis not present

## 2022-01-28 DIAGNOSIS — M62551 Muscle wasting and atrophy, not elsewhere classified, right thigh: Secondary | ICD-10-CM | POA: Diagnosis not present

## 2022-01-28 DIAGNOSIS — R262 Difficulty in walking, not elsewhere classified: Secondary | ICD-10-CM | POA: Diagnosis not present

## 2022-01-28 DIAGNOSIS — M62552 Muscle wasting and atrophy, not elsewhere classified, left thigh: Secondary | ICD-10-CM | POA: Diagnosis not present

## 2022-01-31 DIAGNOSIS — M62561 Muscle wasting and atrophy, not elsewhere classified, right lower leg: Secondary | ICD-10-CM | POA: Diagnosis not present

## 2022-01-31 DIAGNOSIS — M62562 Muscle wasting and atrophy, not elsewhere classified, left lower leg: Secondary | ICD-10-CM | POA: Diagnosis not present

## 2022-01-31 DIAGNOSIS — Z471 Aftercare following joint replacement surgery: Secondary | ICD-10-CM | POA: Diagnosis not present

## 2022-01-31 DIAGNOSIS — M62551 Muscle wasting and atrophy, not elsewhere classified, right thigh: Secondary | ICD-10-CM | POA: Diagnosis not present

## 2022-01-31 DIAGNOSIS — M62552 Muscle wasting and atrophy, not elsewhere classified, left thigh: Secondary | ICD-10-CM | POA: Diagnosis not present

## 2022-01-31 DIAGNOSIS — R262 Difficulty in walking, not elsewhere classified: Secondary | ICD-10-CM | POA: Diagnosis not present

## 2022-02-02 DIAGNOSIS — E559 Vitamin D deficiency, unspecified: Secondary | ICD-10-CM | POA: Diagnosis not present

## 2022-02-02 DIAGNOSIS — E7800 Pure hypercholesterolemia, unspecified: Secondary | ICD-10-CM | POA: Diagnosis not present

## 2022-02-02 DIAGNOSIS — A6002 Herpesviral infection of other male genital organs: Secondary | ICD-10-CM | POA: Diagnosis not present

## 2022-02-02 DIAGNOSIS — I10 Essential (primary) hypertension: Secondary | ICD-10-CM | POA: Diagnosis not present

## 2022-02-02 DIAGNOSIS — M79642 Pain in left hand: Secondary | ICD-10-CM | POA: Diagnosis not present

## 2022-02-02 DIAGNOSIS — Z6831 Body mass index (BMI) 31.0-31.9, adult: Secondary | ICD-10-CM | POA: Diagnosis not present

## 2022-02-02 DIAGNOSIS — M79641 Pain in right hand: Secondary | ICD-10-CM | POA: Diagnosis not present

## 2022-02-02 DIAGNOSIS — M129 Arthropathy, unspecified: Secondary | ICD-10-CM | POA: Diagnosis not present

## 2022-02-02 DIAGNOSIS — R7303 Prediabetes: Secondary | ICD-10-CM | POA: Diagnosis not present

## 2022-02-02 DIAGNOSIS — N1832 Chronic kidney disease, stage 3b: Secondary | ICD-10-CM | POA: Diagnosis not present

## 2022-02-02 DIAGNOSIS — N529 Male erectile dysfunction, unspecified: Secondary | ICD-10-CM | POA: Diagnosis not present

## 2022-02-02 DIAGNOSIS — Z8673 Personal history of transient ischemic attack (TIA), and cerebral infarction without residual deficits: Secondary | ICD-10-CM | POA: Diagnosis not present

## 2022-02-02 DIAGNOSIS — J852 Abscess of lung without pneumonia: Secondary | ICD-10-CM | POA: Diagnosis not present

## 2022-02-03 DIAGNOSIS — M62562 Muscle wasting and atrophy, not elsewhere classified, left lower leg: Secondary | ICD-10-CM | POA: Diagnosis not present

## 2022-02-03 DIAGNOSIS — M62552 Muscle wasting and atrophy, not elsewhere classified, left thigh: Secondary | ICD-10-CM | POA: Diagnosis not present

## 2022-02-03 DIAGNOSIS — R262 Difficulty in walking, not elsewhere classified: Secondary | ICD-10-CM | POA: Diagnosis not present

## 2022-02-03 DIAGNOSIS — M62551 Muscle wasting and atrophy, not elsewhere classified, right thigh: Secondary | ICD-10-CM | POA: Diagnosis not present

## 2022-02-03 DIAGNOSIS — M62561 Muscle wasting and atrophy, not elsewhere classified, right lower leg: Secondary | ICD-10-CM | POA: Diagnosis not present

## 2022-02-03 DIAGNOSIS — Z471 Aftercare following joint replacement surgery: Secondary | ICD-10-CM | POA: Diagnosis not present

## 2022-02-04 DIAGNOSIS — M62551 Muscle wasting and atrophy, not elsewhere classified, right thigh: Secondary | ICD-10-CM | POA: Diagnosis not present

## 2022-02-04 DIAGNOSIS — M62561 Muscle wasting and atrophy, not elsewhere classified, right lower leg: Secondary | ICD-10-CM | POA: Diagnosis not present

## 2022-02-04 DIAGNOSIS — Z471 Aftercare following joint replacement surgery: Secondary | ICD-10-CM | POA: Diagnosis not present

## 2022-02-04 DIAGNOSIS — R262 Difficulty in walking, not elsewhere classified: Secondary | ICD-10-CM | POA: Diagnosis not present

## 2022-02-04 DIAGNOSIS — M62562 Muscle wasting and atrophy, not elsewhere classified, left lower leg: Secondary | ICD-10-CM | POA: Diagnosis not present

## 2022-02-04 DIAGNOSIS — M62552 Muscle wasting and atrophy, not elsewhere classified, left thigh: Secondary | ICD-10-CM | POA: Diagnosis not present

## 2022-02-04 DIAGNOSIS — F332 Major depressive disorder, recurrent severe without psychotic features: Secondary | ICD-10-CM | POA: Diagnosis not present

## 2022-02-07 DIAGNOSIS — M62561 Muscle wasting and atrophy, not elsewhere classified, right lower leg: Secondary | ICD-10-CM | POA: Diagnosis not present

## 2022-02-07 DIAGNOSIS — M62552 Muscle wasting and atrophy, not elsewhere classified, left thigh: Secondary | ICD-10-CM | POA: Diagnosis not present

## 2022-02-07 DIAGNOSIS — R262 Difficulty in walking, not elsewhere classified: Secondary | ICD-10-CM | POA: Diagnosis not present

## 2022-02-07 DIAGNOSIS — M62551 Muscle wasting and atrophy, not elsewhere classified, right thigh: Secondary | ICD-10-CM | POA: Diagnosis not present

## 2022-02-07 DIAGNOSIS — M62562 Muscle wasting and atrophy, not elsewhere classified, left lower leg: Secondary | ICD-10-CM | POA: Diagnosis not present

## 2022-02-07 DIAGNOSIS — Z471 Aftercare following joint replacement surgery: Secondary | ICD-10-CM | POA: Diagnosis not present

## 2022-02-09 DIAGNOSIS — M62561 Muscle wasting and atrophy, not elsewhere classified, right lower leg: Secondary | ICD-10-CM | POA: Diagnosis not present

## 2022-02-09 DIAGNOSIS — M62562 Muscle wasting and atrophy, not elsewhere classified, left lower leg: Secondary | ICD-10-CM | POA: Diagnosis not present

## 2022-02-09 DIAGNOSIS — M62551 Muscle wasting and atrophy, not elsewhere classified, right thigh: Secondary | ICD-10-CM | POA: Diagnosis not present

## 2022-02-09 DIAGNOSIS — R262 Difficulty in walking, not elsewhere classified: Secondary | ICD-10-CM | POA: Diagnosis not present

## 2022-02-09 DIAGNOSIS — M62552 Muscle wasting and atrophy, not elsewhere classified, left thigh: Secondary | ICD-10-CM | POA: Diagnosis not present

## 2022-02-09 DIAGNOSIS — Z471 Aftercare following joint replacement surgery: Secondary | ICD-10-CM | POA: Diagnosis not present

## 2022-02-11 DIAGNOSIS — M62551 Muscle wasting and atrophy, not elsewhere classified, right thigh: Secondary | ICD-10-CM | POA: Diagnosis not present

## 2022-02-11 DIAGNOSIS — M62561 Muscle wasting and atrophy, not elsewhere classified, right lower leg: Secondary | ICD-10-CM | POA: Diagnosis not present

## 2022-02-11 DIAGNOSIS — Z471 Aftercare following joint replacement surgery: Secondary | ICD-10-CM | POA: Diagnosis not present

## 2022-02-11 DIAGNOSIS — M62552 Muscle wasting and atrophy, not elsewhere classified, left thigh: Secondary | ICD-10-CM | POA: Diagnosis not present

## 2022-02-11 DIAGNOSIS — R262 Difficulty in walking, not elsewhere classified: Secondary | ICD-10-CM | POA: Diagnosis not present

## 2022-02-11 DIAGNOSIS — M62562 Muscle wasting and atrophy, not elsewhere classified, left lower leg: Secondary | ICD-10-CM | POA: Diagnosis not present

## 2022-02-14 DIAGNOSIS — M62561 Muscle wasting and atrophy, not elsewhere classified, right lower leg: Secondary | ICD-10-CM | POA: Diagnosis not present

## 2022-02-14 DIAGNOSIS — M62562 Muscle wasting and atrophy, not elsewhere classified, left lower leg: Secondary | ICD-10-CM | POA: Diagnosis not present

## 2022-02-14 DIAGNOSIS — M62551 Muscle wasting and atrophy, not elsewhere classified, right thigh: Secondary | ICD-10-CM | POA: Diagnosis not present

## 2022-02-14 DIAGNOSIS — Z471 Aftercare following joint replacement surgery: Secondary | ICD-10-CM | POA: Diagnosis not present

## 2022-02-14 DIAGNOSIS — M62552 Muscle wasting and atrophy, not elsewhere classified, left thigh: Secondary | ICD-10-CM | POA: Diagnosis not present

## 2022-02-14 DIAGNOSIS — R262 Difficulty in walking, not elsewhere classified: Secondary | ICD-10-CM | POA: Diagnosis not present

## 2022-02-16 DIAGNOSIS — M62562 Muscle wasting and atrophy, not elsewhere classified, left lower leg: Secondary | ICD-10-CM | POA: Diagnosis not present

## 2022-02-16 DIAGNOSIS — M62561 Muscle wasting and atrophy, not elsewhere classified, right lower leg: Secondary | ICD-10-CM | POA: Diagnosis not present

## 2022-02-16 DIAGNOSIS — R262 Difficulty in walking, not elsewhere classified: Secondary | ICD-10-CM | POA: Diagnosis not present

## 2022-02-16 DIAGNOSIS — M62551 Muscle wasting and atrophy, not elsewhere classified, right thigh: Secondary | ICD-10-CM | POA: Diagnosis not present

## 2022-02-16 DIAGNOSIS — Z471 Aftercare following joint replacement surgery: Secondary | ICD-10-CM | POA: Diagnosis not present

## 2022-02-16 DIAGNOSIS — M62552 Muscle wasting and atrophy, not elsewhere classified, left thigh: Secondary | ICD-10-CM | POA: Diagnosis not present

## 2022-02-21 DIAGNOSIS — F411 Generalized anxiety disorder: Secondary | ICD-10-CM | POA: Diagnosis not present

## 2022-02-21 DIAGNOSIS — F331 Major depressive disorder, recurrent, moderate: Secondary | ICD-10-CM | POA: Diagnosis not present

## 2022-02-22 DIAGNOSIS — R630 Anorexia: Secondary | ICD-10-CM | POA: Diagnosis not present

## 2022-02-22 DIAGNOSIS — R197 Diarrhea, unspecified: Secondary | ICD-10-CM | POA: Diagnosis not present

## 2022-02-24 DIAGNOSIS — F332 Major depressive disorder, recurrent severe without psychotic features: Secondary | ICD-10-CM | POA: Diagnosis not present

## 2022-02-25 DIAGNOSIS — M62552 Muscle wasting and atrophy, not elsewhere classified, left thigh: Secondary | ICD-10-CM | POA: Diagnosis not present

## 2022-02-25 DIAGNOSIS — M62562 Muscle wasting and atrophy, not elsewhere classified, left lower leg: Secondary | ICD-10-CM | POA: Diagnosis not present

## 2022-02-25 DIAGNOSIS — M62551 Muscle wasting and atrophy, not elsewhere classified, right thigh: Secondary | ICD-10-CM | POA: Diagnosis not present

## 2022-02-25 DIAGNOSIS — M62561 Muscle wasting and atrophy, not elsewhere classified, right lower leg: Secondary | ICD-10-CM | POA: Diagnosis not present

## 2022-02-25 DIAGNOSIS — R262 Difficulty in walking, not elsewhere classified: Secondary | ICD-10-CM | POA: Diagnosis not present

## 2022-02-25 DIAGNOSIS — Z471 Aftercare following joint replacement surgery: Secondary | ICD-10-CM | POA: Diagnosis not present

## 2022-03-02 DIAGNOSIS — M62561 Muscle wasting and atrophy, not elsewhere classified, right lower leg: Secondary | ICD-10-CM | POA: Diagnosis not present

## 2022-03-02 DIAGNOSIS — R262 Difficulty in walking, not elsewhere classified: Secondary | ICD-10-CM | POA: Diagnosis not present

## 2022-03-02 DIAGNOSIS — Z471 Aftercare following joint replacement surgery: Secondary | ICD-10-CM | POA: Diagnosis not present

## 2022-03-02 DIAGNOSIS — M62562 Muscle wasting and atrophy, not elsewhere classified, left lower leg: Secondary | ICD-10-CM | POA: Diagnosis not present

## 2022-03-02 DIAGNOSIS — M62551 Muscle wasting and atrophy, not elsewhere classified, right thigh: Secondary | ICD-10-CM | POA: Diagnosis not present

## 2022-03-02 DIAGNOSIS — M62552 Muscle wasting and atrophy, not elsewhere classified, left thigh: Secondary | ICD-10-CM | POA: Diagnosis not present

## 2022-03-10 DIAGNOSIS — M25561 Pain in right knee: Secondary | ICD-10-CM | POA: Diagnosis not present

## 2022-03-10 DIAGNOSIS — Z5189 Encounter for other specified aftercare: Secondary | ICD-10-CM | POA: Diagnosis not present

## 2022-03-11 DIAGNOSIS — N189 Chronic kidney disease, unspecified: Secondary | ICD-10-CM | POA: Diagnosis not present

## 2022-03-11 DIAGNOSIS — N2581 Secondary hyperparathyroidism of renal origin: Secondary | ICD-10-CM | POA: Diagnosis not present

## 2022-03-11 DIAGNOSIS — R0602 Shortness of breath: Secondary | ICD-10-CM | POA: Diagnosis not present

## 2022-03-11 DIAGNOSIS — R778 Other specified abnormalities of plasma proteins: Secondary | ICD-10-CM | POA: Diagnosis not present

## 2022-03-11 DIAGNOSIS — N1832 Chronic kidney disease, stage 3b: Secondary | ICD-10-CM | POA: Diagnosis not present

## 2022-03-11 DIAGNOSIS — D631 Anemia in chronic kidney disease: Secondary | ICD-10-CM | POA: Diagnosis not present

## 2022-03-11 DIAGNOSIS — Z87891 Personal history of nicotine dependence: Secondary | ICD-10-CM | POA: Diagnosis not present

## 2022-03-11 DIAGNOSIS — I10 Essential (primary) hypertension: Secondary | ICD-10-CM | POA: Diagnosis not present

## 2022-03-11 DIAGNOSIS — I129 Hypertensive chronic kidney disease with stage 1 through stage 4 chronic kidney disease, or unspecified chronic kidney disease: Secondary | ICD-10-CM | POA: Diagnosis not present

## 2022-03-14 DIAGNOSIS — M62551 Muscle wasting and atrophy, not elsewhere classified, right thigh: Secondary | ICD-10-CM | POA: Diagnosis not present

## 2022-03-14 DIAGNOSIS — M62552 Muscle wasting and atrophy, not elsewhere classified, left thigh: Secondary | ICD-10-CM | POA: Diagnosis not present

## 2022-03-14 DIAGNOSIS — Z471 Aftercare following joint replacement surgery: Secondary | ICD-10-CM | POA: Diagnosis not present

## 2022-03-14 DIAGNOSIS — M62562 Muscle wasting and atrophy, not elsewhere classified, left lower leg: Secondary | ICD-10-CM | POA: Diagnosis not present

## 2022-03-14 DIAGNOSIS — R262 Difficulty in walking, not elsewhere classified: Secondary | ICD-10-CM | POA: Diagnosis not present

## 2022-03-14 DIAGNOSIS — M62561 Muscle wasting and atrophy, not elsewhere classified, right lower leg: Secondary | ICD-10-CM | POA: Diagnosis not present

## 2022-03-15 DIAGNOSIS — M79642 Pain in left hand: Secondary | ICD-10-CM | POA: Diagnosis not present

## 2022-03-15 DIAGNOSIS — Z6831 Body mass index (BMI) 31.0-31.9, adult: Secondary | ICD-10-CM | POA: Diagnosis not present

## 2022-03-15 DIAGNOSIS — M79671 Pain in right foot: Secondary | ICD-10-CM | POA: Diagnosis not present

## 2022-03-15 DIAGNOSIS — M2540 Effusion, unspecified joint: Secondary | ICD-10-CM | POA: Diagnosis not present

## 2022-03-15 DIAGNOSIS — E669 Obesity, unspecified: Secondary | ICD-10-CM | POA: Diagnosis not present

## 2022-03-15 DIAGNOSIS — M25511 Pain in right shoulder: Secondary | ICD-10-CM | POA: Diagnosis not present

## 2022-03-15 DIAGNOSIS — M79641 Pain in right hand: Secondary | ICD-10-CM | POA: Diagnosis not present

## 2022-03-15 DIAGNOSIS — M79672 Pain in left foot: Secondary | ICD-10-CM | POA: Diagnosis not present

## 2022-03-15 DIAGNOSIS — M069 Rheumatoid arthritis, unspecified: Secondary | ICD-10-CM | POA: Diagnosis not present

## 2022-03-18 DIAGNOSIS — M62551 Muscle wasting and atrophy, not elsewhere classified, right thigh: Secondary | ICD-10-CM | POA: Diagnosis not present

## 2022-03-18 DIAGNOSIS — M62562 Muscle wasting and atrophy, not elsewhere classified, left lower leg: Secondary | ICD-10-CM | POA: Diagnosis not present

## 2022-03-18 DIAGNOSIS — R262 Difficulty in walking, not elsewhere classified: Secondary | ICD-10-CM | POA: Diagnosis not present

## 2022-03-18 DIAGNOSIS — M62561 Muscle wasting and atrophy, not elsewhere classified, right lower leg: Secondary | ICD-10-CM | POA: Diagnosis not present

## 2022-03-18 DIAGNOSIS — M62552 Muscle wasting and atrophy, not elsewhere classified, left thigh: Secondary | ICD-10-CM | POA: Diagnosis not present

## 2022-03-18 DIAGNOSIS — Z471 Aftercare following joint replacement surgery: Secondary | ICD-10-CM | POA: Diagnosis not present

## 2022-03-22 DIAGNOSIS — Z5189 Encounter for other specified aftercare: Secondary | ICD-10-CM | POA: Diagnosis not present

## 2022-03-22 DIAGNOSIS — F331 Major depressive disorder, recurrent, moderate: Secondary | ICD-10-CM | POA: Diagnosis not present

## 2022-03-22 DIAGNOSIS — F411 Generalized anxiety disorder: Secondary | ICD-10-CM | POA: Diagnosis not present

## 2022-03-24 DIAGNOSIS — M62552 Muscle wasting and atrophy, not elsewhere classified, left thigh: Secondary | ICD-10-CM | POA: Diagnosis not present

## 2022-03-24 DIAGNOSIS — R262 Difficulty in walking, not elsewhere classified: Secondary | ICD-10-CM | POA: Diagnosis not present

## 2022-03-24 DIAGNOSIS — Z471 Aftercare following joint replacement surgery: Secondary | ICD-10-CM | POA: Diagnosis not present

## 2022-03-24 DIAGNOSIS — M62551 Muscle wasting and atrophy, not elsewhere classified, right thigh: Secondary | ICD-10-CM | POA: Diagnosis not present

## 2022-03-24 DIAGNOSIS — F332 Major depressive disorder, recurrent severe without psychotic features: Secondary | ICD-10-CM | POA: Diagnosis not present

## 2022-03-24 DIAGNOSIS — M62562 Muscle wasting and atrophy, not elsewhere classified, left lower leg: Secondary | ICD-10-CM | POA: Diagnosis not present

## 2022-03-24 DIAGNOSIS — M62561 Muscle wasting and atrophy, not elsewhere classified, right lower leg: Secondary | ICD-10-CM | POA: Diagnosis not present

## 2022-03-25 ENCOUNTER — Other Ambulatory Visit: Payer: Self-pay | Admitting: Nephrology

## 2022-03-25 DIAGNOSIS — N1832 Chronic kidney disease, stage 3b: Secondary | ICD-10-CM

## 2022-03-30 DIAGNOSIS — M62562 Muscle wasting and atrophy, not elsewhere classified, left lower leg: Secondary | ICD-10-CM | POA: Diagnosis not present

## 2022-03-30 DIAGNOSIS — R197 Diarrhea, unspecified: Secondary | ICD-10-CM | POA: Diagnosis not present

## 2022-03-30 DIAGNOSIS — Z471 Aftercare following joint replacement surgery: Secondary | ICD-10-CM | POA: Diagnosis not present

## 2022-03-30 DIAGNOSIS — M62561 Muscle wasting and atrophy, not elsewhere classified, right lower leg: Secondary | ICD-10-CM | POA: Diagnosis not present

## 2022-03-30 DIAGNOSIS — R262 Difficulty in walking, not elsewhere classified: Secondary | ICD-10-CM | POA: Diagnosis not present

## 2022-03-30 DIAGNOSIS — M62552 Muscle wasting and atrophy, not elsewhere classified, left thigh: Secondary | ICD-10-CM | POA: Diagnosis not present

## 2022-03-30 DIAGNOSIS — M62551 Muscle wasting and atrophy, not elsewhere classified, right thigh: Secondary | ICD-10-CM | POA: Diagnosis not present

## 2022-03-31 DIAGNOSIS — H6121 Impacted cerumen, right ear: Secondary | ICD-10-CM | POA: Diagnosis not present

## 2022-03-31 DIAGNOSIS — I10 Essential (primary) hypertension: Secondary | ICD-10-CM | POA: Diagnosis not present

## 2022-03-31 DIAGNOSIS — N529 Male erectile dysfunction, unspecified: Secondary | ICD-10-CM | POA: Diagnosis not present

## 2022-03-31 DIAGNOSIS — N1832 Chronic kidney disease, stage 3b: Secondary | ICD-10-CM | POA: Diagnosis not present

## 2022-03-31 DIAGNOSIS — E559 Vitamin D deficiency, unspecified: Secondary | ICD-10-CM | POA: Diagnosis not present

## 2022-03-31 DIAGNOSIS — Z8673 Personal history of transient ischemic attack (TIA), and cerebral infarction without residual deficits: Secondary | ICD-10-CM | POA: Diagnosis not present

## 2022-03-31 DIAGNOSIS — J852 Abscess of lung without pneumonia: Secondary | ICD-10-CM | POA: Diagnosis not present

## 2022-03-31 DIAGNOSIS — M79641 Pain in right hand: Secondary | ICD-10-CM | POA: Diagnosis not present

## 2022-03-31 DIAGNOSIS — A6002 Herpesviral infection of other male genital organs: Secondary | ICD-10-CM | POA: Diagnosis not present

## 2022-03-31 DIAGNOSIS — M79642 Pain in left hand: Secondary | ICD-10-CM | POA: Diagnosis not present

## 2022-03-31 DIAGNOSIS — E7800 Pure hypercholesterolemia, unspecified: Secondary | ICD-10-CM | POA: Diagnosis not present

## 2022-03-31 DIAGNOSIS — R7303 Prediabetes: Secondary | ICD-10-CM | POA: Diagnosis not present

## 2022-04-05 NOTE — Progress Notes (Signed)
Cardiology Office Note:    Date:  04/19/2022   ID:  [name], DOB January 08, 1951, MRN [id number]  PCP:  Christopher Cowman, MD  [facility] Cardiologist:  Christopher Lean, MD     Referring MD: Christopher Cowman, MD   Chief Complaint:  No chief complaint on file.     History of Present Illness:   [name] is a 72 y.o. male with history of HTN, HLD, CVA,  diastolic CHF.      Patient last saw Dr. Gasper Leon 09/2021 for preop clearnace. Metoprolol decreased 25 mg daily due to low BP and fatigue.   Patient comes in with his daughter. Knee surgery has been tough. No longer walking with a cane. Will need oral surgery soon and may have to hold plavix again. Crt 1.52 in March, has IBS, rheumatoid and osteoarthritis seeing rheumatology. When he eat out he eats fried food, chinese food. He's eating ensure.       Past Medical History:  Diagnosis Date   (HFpEF) heart failure with preserved ejection fraction    Arthritis    Depression    Frequent urination    GERD (gastroesophageal reflux disease)    Headache    mirgaines in the past   Herpes    History of nuclear stress test    Myoview 05/2021: EF 77, normal perfusion, low risk   Hyperlipidemia    Hypertension    Memory loss    Obesity    Stroke    Urgency of urination    Current Medications: Current Meds  Medication Sig   acetaminophen (TYLENOL) 500 MG tablet Take 1-2 tablets (500-1,000 mg total) by mouth every 6 (six) hours as needed for moderate pain.   amLODipine (NORVASC) 5 MG tablet Take 1 tablet (5 mg total) by mouth daily.   busPIRone (BUSPAR) 30 MG tablet Take 30 mg by mouth 2 (two) times daily.   candesartan (ATACAND) 32 MG tablet Take 32 mg by mouth daily.   clopidogrel (PLAVIX) 75 MG tablet Take 75 mg by mouth daily.   desloratadine (CLARINEX) 5 MG tablet Take 5 mg by mouth daily as needed (allergies).   flunisolide (NASALIDE) 25 MCG/ACT (0.025%) SOLN 2 sprays 2 (two) times daily.   folic  acid (FOLVITE) 1 MG tablet Take 1 mg by mouth daily.   furosemide (LASIX) 40 MG tablet Take 40 mg by mouth daily.   hydrALAZINE (APRESOLINE) 50 MG tablet Take 1 tablet (50 mg total) by mouth 2 (two) times daily.   hydroxychloroquine (PLAQUENIL) 200 MG tablet Take 400 mg by mouth at bedtime.   metoprolol succinate (TOPROL XL) 25 MG 24 hr tablet Take 1 tablet (25 mg total) by mouth daily.   PARoxetine (PAXIL-CR) 37.5 MG 24 hr tablet Take 1 tablet (37.5 mg total) by mouth at bedtime.   primidone (MYSOLINE) 250 MG tablet TAKE 1 TABLET BY MOUTH TWICE A DAY   QUEtiapine (SEROQUEL) 25 MG tablet TAKE 3 TABLETS BY MOUTH AT BEDTIME (Patient taking differently: Take 75 mg by mouth at bedtime.)   rosuvastatin (CRESTOR) 40 MG tablet Take 40 mg by mouth daily.   sildenafil (VIAGRA) 50 MG tablet Take 100 mg by mouth as needed for erectile dysfunction.   spironolactone (ALDACTONE) 25 MG tablet Take 25 mg by mouth daily.   Suvorexant (BELSOMRA) 10 MG TABS Take 1 tablet by mouth at bedtime.   traZODone (DESYREL) 150 MG tablet Take 1 tablet (150 mg total) by mouth at bedtime as needed.  for sleep   valACYclovir (VALTREX) 1000 MG tablet Take 1,000 mg by mouth daily.   Vitamin D, Ergocalciferol, (DRISDOL) 1.25 MG (50000 UNIT) CAPS capsule Take 50,000 Units by mouth every Saturday.    Allergies:   Amitriptyline, Aspirin, Lisinopril, Lithium, Morphine, Nsaids, Oxycodone, Oxycodone hcl, Oxycodone-acetaminophen, Pork allergy, and Pork-derived products   Social History   Tobacco Use   Smoking status: Former   Smokeless tobacco: Never  Scientific laboratory technician Use: Never used  Substance Use Topics   Alcohol use: No    Alcohol/week: 1.0 standard drink of alcohol    Types: 1 Cans of beer per week   Drug use: No    Family Hx: The patient's family history includes Cancer in his maternal aunt and maternal aunt; Cancer - Ovarian in his maternal grandmother; Diabetes Mellitus I in his paternal grandmother; Hypertension in  his brother, father, mother, paternal grandfather, and sister.  ROS     Physical Exam:    VS:  BP 120/82   Pulse 72   Ht 5\' 7"  (1.702 m)   Wt 191 lb 3.2 oz (86.7 kg)   SpO2 94%   BMI 29.95 kg/m     Wt Readings from Last 3 Encounters:  04/19/22 191 lb 3.2 oz (86.7 kg)  11/15/21 187 lb 2.7 oz (84.9 kg)  11/02/21 187 lb 2.7 oz (84.9 kg)    Physical Exam  GEN: Well nourished, well developed, in no acute distress  HEENT: normal  Neck: no JVD, carotid bruits, or masses Cardiac:RRR; no murmurs, rubs, or gallops  Respiratory:  clear to auscultation bilaterally, normal work of breathing GI: soft, nontender, nondistended, + BS Ext: without cyanosis, clubbing, or edema, Good distal pulses bilaterally MS: no deformity or atrophy  Skin: warm and dry, no rash Neuro:  Alert and Oriented x 3, Strength and sensation are intact Psych: euthymic mood, full affect        EKGs/Labs/Other Test Reviewed:    EKG:  EKG is   ordered today.  The ekg ordered today demonstrates NSR with RBBB/LAFB LVH unchanged  Recent Labs: 11/16/2021: BUN 12; Creatinine, Ser 1.21; Potassium 3.7; Sodium 135 11/17/2021: Hemoglobin 10.3; Platelets 275   Recent Lipid Panel No results for input(s): "CHOL", "TRIG", "HDL", "VLDL", "LDLCALC", "LDLDIRECT" in the last 8760 hours.   Prior CV Studies:   Transthoracic Echocardiogram: Date: 01/02/20 Results: Severe LA dilation; IVC WNL E/E' 20 RVSP 22 1. Left ventricular ejection fraction, by estimation, is 60 to 65%. The  left ventricle has normal function. The left ventricle has no regional  wall motion abnormalities. There is mild concentric left ventricular  hypertrophy. Left ventricular diastolic  parameters are consistent with Grade II diastolic dysfunction  (pseudonormalization). Elevated left atrial pressure.   2. Right ventricular systolic function is normal. The right ventricular  size is mildly enlarged. There is normal pulmonary artery systolic   pressure.   3. Left atrial size was severely dilated.   4. Right atrial size was moderately dilated.   5. The mitral valve is normal in structure. No evidence of mitral valve  regurgitation. No evidence of mitral stenosis.   6. The aortic valve is normal in structure. Aortic valve regurgitation is  not visualized. Mild aortic valve sclerosis is present, with no evidence  of aortic valve stenosis.   7. The inferior vena cava is normal in size with greater than 50%  respiratory variability, suggesting right atrial pressure of 3 mmHg.   GATED SPECT MYO PERF W/LEXISCAN STRESS 1D  05/20/2021   Narrative   The study is normal. The study is low risk.   No ST deviation was noted.   LV perfusion is normal.   Left ventricular function is normal. Nuclear stress EF: 77 %. The left ventricular ejection fraction is hyperdynamic (>65%). End diastolic cavity size is normal. End systolic cavity size is normal.   Prior study not available for comparison.   Normal perfusion. LVEF 77% with normal wall motion. This is a low risk study. No prior for comparison.     Risk Assessment/Calculations/Metrics:              ASSESSMENT & PLAN:   No problem-specific Assessment & Plan notes found for this encounter.   HFpEF Hydralazine 50 mg PO BID, Amlodipine 5 mg (greater than 5 worsening leg swelling) - candesartan 32 mg PO daily; has prior lisinopril allergy -  metoprolol succinate 25 mg Po daily  - Aldactone 25 mg PO  - 2 gm sodium diet    HTN-BP controlled  HLD on crestor 40 mg daily managed by PCP            Dispo:  No follow-ups on file.   Medication Adjustments/Labs and Tests Ordered: Current medicines are reviewed at length with the patient today.  Concerns regarding medicines are outlined above.  Tests Ordered: Orders Placed This Encounter  Procedures   EKG 12-Lead   Medication Changes: No orders of the defined types were placed in this encounter.  Christopher Boast, PA-C   04/19/2022 11:58 AM    [address], [place], [place]  [address] Phone: [phone]; Fax: [phone]

## 2022-04-06 DIAGNOSIS — R262 Difficulty in walking, not elsewhere classified: Secondary | ICD-10-CM | POA: Diagnosis not present

## 2022-04-06 DIAGNOSIS — A6002 Herpesviral infection of other male genital organs: Secondary | ICD-10-CM | POA: Diagnosis not present

## 2022-04-06 DIAGNOSIS — M79642 Pain in left hand: Secondary | ICD-10-CM | POA: Diagnosis not present

## 2022-04-06 DIAGNOSIS — E559 Vitamin D deficiency, unspecified: Secondary | ICD-10-CM | POA: Diagnosis not present

## 2022-04-06 DIAGNOSIS — E7800 Pure hypercholesterolemia, unspecified: Secondary | ICD-10-CM | POA: Diagnosis not present

## 2022-04-06 DIAGNOSIS — Z8673 Personal history of transient ischemic attack (TIA), and cerebral infarction without residual deficits: Secondary | ICD-10-CM | POA: Diagnosis not present

## 2022-04-06 DIAGNOSIS — Z6832 Body mass index (BMI) 32.0-32.9, adult: Secondary | ICD-10-CM | POA: Diagnosis not present

## 2022-04-06 DIAGNOSIS — M62552 Muscle wasting and atrophy, not elsewhere classified, left thigh: Secondary | ICD-10-CM | POA: Diagnosis not present

## 2022-04-06 DIAGNOSIS — H6121 Impacted cerumen, right ear: Secondary | ICD-10-CM | POA: Diagnosis not present

## 2022-04-06 DIAGNOSIS — M62551 Muscle wasting and atrophy, not elsewhere classified, right thigh: Secondary | ICD-10-CM | POA: Diagnosis not present

## 2022-04-06 DIAGNOSIS — M62562 Muscle wasting and atrophy, not elsewhere classified, left lower leg: Secondary | ICD-10-CM | POA: Diagnosis not present

## 2022-04-06 DIAGNOSIS — M79641 Pain in right hand: Secondary | ICD-10-CM | POA: Diagnosis not present

## 2022-04-06 DIAGNOSIS — N1832 Chronic kidney disease, stage 3b: Secondary | ICD-10-CM | POA: Diagnosis not present

## 2022-04-06 DIAGNOSIS — M62561 Muscle wasting and atrophy, not elsewhere classified, right lower leg: Secondary | ICD-10-CM | POA: Diagnosis not present

## 2022-04-06 DIAGNOSIS — Z471 Aftercare following joint replacement surgery: Secondary | ICD-10-CM | POA: Diagnosis not present

## 2022-04-06 DIAGNOSIS — I10 Essential (primary) hypertension: Secondary | ICD-10-CM | POA: Diagnosis not present

## 2022-04-06 DIAGNOSIS — R7303 Prediabetes: Secondary | ICD-10-CM | POA: Diagnosis not present

## 2022-04-06 DIAGNOSIS — N529 Male erectile dysfunction, unspecified: Secondary | ICD-10-CM | POA: Diagnosis not present

## 2022-04-11 DIAGNOSIS — M62561 Muscle wasting and atrophy, not elsewhere classified, right lower leg: Secondary | ICD-10-CM | POA: Diagnosis not present

## 2022-04-11 DIAGNOSIS — M62551 Muscle wasting and atrophy, not elsewhere classified, right thigh: Secondary | ICD-10-CM | POA: Diagnosis not present

## 2022-04-11 DIAGNOSIS — Z471 Aftercare following joint replacement surgery: Secondary | ICD-10-CM | POA: Diagnosis not present

## 2022-04-11 DIAGNOSIS — R262 Difficulty in walking, not elsewhere classified: Secondary | ICD-10-CM | POA: Diagnosis not present

## 2022-04-11 DIAGNOSIS — M62552 Muscle wasting and atrophy, not elsewhere classified, left thigh: Secondary | ICD-10-CM | POA: Diagnosis not present

## 2022-04-11 DIAGNOSIS — M62562 Muscle wasting and atrophy, not elsewhere classified, left lower leg: Secondary | ICD-10-CM | POA: Diagnosis not present

## 2022-04-12 ENCOUNTER — Telehealth: Payer: Self-pay | Admitting: Nurse Practitioner

## 2022-04-12 NOTE — Telephone Encounter (Signed)
scheduled per 3/20 referral , pt has been called and confirmed date and time. Pt is aware of location and to arrive early for check in

## 2022-04-14 DIAGNOSIS — Z5189 Encounter for other specified aftercare: Secondary | ICD-10-CM | POA: Diagnosis not present

## 2022-04-14 DIAGNOSIS — Z96651 Presence of right artificial knee joint: Secondary | ICD-10-CM | POA: Diagnosis not present

## 2022-04-15 DIAGNOSIS — M62561 Muscle wasting and atrophy, not elsewhere classified, right lower leg: Secondary | ICD-10-CM | POA: Diagnosis not present

## 2022-04-15 DIAGNOSIS — M62551 Muscle wasting and atrophy, not elsewhere classified, right thigh: Secondary | ICD-10-CM | POA: Diagnosis not present

## 2022-04-15 DIAGNOSIS — Z471 Aftercare following joint replacement surgery: Secondary | ICD-10-CM | POA: Diagnosis not present

## 2022-04-15 DIAGNOSIS — R262 Difficulty in walking, not elsewhere classified: Secondary | ICD-10-CM | POA: Diagnosis not present

## 2022-04-15 DIAGNOSIS — M62562 Muscle wasting and atrophy, not elsewhere classified, left lower leg: Secondary | ICD-10-CM | POA: Diagnosis not present

## 2022-04-15 DIAGNOSIS — M62552 Muscle wasting and atrophy, not elsewhere classified, left thigh: Secondary | ICD-10-CM | POA: Diagnosis not present

## 2022-04-17 DIAGNOSIS — Z471 Aftercare following joint replacement surgery: Secondary | ICD-10-CM | POA: Diagnosis not present

## 2022-04-17 DIAGNOSIS — M62562 Muscle wasting and atrophy, not elsewhere classified, left lower leg: Secondary | ICD-10-CM | POA: Diagnosis not present

## 2022-04-17 DIAGNOSIS — M62561 Muscle wasting and atrophy, not elsewhere classified, right lower leg: Secondary | ICD-10-CM | POA: Diagnosis not present

## 2022-04-17 DIAGNOSIS — M62551 Muscle wasting and atrophy, not elsewhere classified, right thigh: Secondary | ICD-10-CM | POA: Diagnosis not present

## 2022-04-17 DIAGNOSIS — M62552 Muscle wasting and atrophy, not elsewhere classified, left thigh: Secondary | ICD-10-CM | POA: Diagnosis not present

## 2022-04-17 DIAGNOSIS — R262 Difficulty in walking, not elsewhere classified: Secondary | ICD-10-CM | POA: Diagnosis not present

## 2022-04-19 ENCOUNTER — Ambulatory Visit: Payer: Medicare Other | Attending: Physician Assistant | Admitting: Physician Assistant

## 2022-04-19 ENCOUNTER — Encounter: Payer: Self-pay | Admitting: Physician Assistant

## 2022-04-19 ENCOUNTER — Ambulatory Visit: Payer: Medicare Other | Admitting: Physician Assistant

## 2022-04-19 VITALS — BP 120/82 | HR 72 | Ht 67.0 in | Wt 191.2 lb

## 2022-04-19 DIAGNOSIS — I10 Essential (primary) hypertension: Secondary | ICD-10-CM

## 2022-04-19 DIAGNOSIS — E7849 Other hyperlipidemia: Secondary | ICD-10-CM

## 2022-04-19 DIAGNOSIS — I5032 Chronic diastolic (congestive) heart failure: Secondary | ICD-10-CM | POA: Insufficient documentation

## 2022-04-19 NOTE — Patient Instructions (Signed)
Medication Instructions:  Your physician recommends that you continue on your current medications as directed. Please refer to the Current Medication list given to you today.  *If you need a refill on your cardiac medications before your next appointment, please call your pharmacy*   Lab Work: NONE If you have labs (blood work) drawn today and your tests are completely normal, you will receive your results only by: [place] (if you have MyChart) OR A paper copy in the mail If you have any lab test that is abnormal or we need to change your treatment, we will call you to review the results.   Testing/Procedures: NONE   Follow-Up: At [facility], you and your health needs are our priority.  As part of our continuing mission to provide you with exceptional heart care, we have created designated Provider Care Teams.  These Care Teams include your primary Cardiologist (physician) and Advanced Practice Providers (APPs -  Physician Assistants and Nurse Practitioners) who all work together to provide you with the care you need, when you need it.  We recommend signing up for the patient portal called "MyChart".  Sign up information is provided on this After Visit Summary.  MyChart is used to connect with patients for Virtual Visits (Telemedicine).  Patients are able to view lab/test results, encounter notes, upcoming appointments, etc.  Non-urgent messages can be sent to your provider as well.   To learn more about what you can do with MyChart, go to [email or web address].    Your next appointment:   6 month(s)  Provider:   Werner Lean, MD    Other Instructions  YOUR PROVIDER RECOMMENDS THAT YOU EXERCISE 150 MINS WEEKLY   Two Gram Sodium Diet 2000 mg  What is Sodium? Sodium is a mineral found naturally in many foods. The most significant source of sodium in the diet is table salt, which is about 40% sodium.  Processed, convenience, and preserved foods also  contain a large amount of sodium.  The body needs only 500 mg of sodium daily to function,  A normal diet provides more than enough sodium even if you do not use salt.  Why Limit Sodium? A build up of sodium in the body can cause thirst, increased blood pressure, shortness of breath, and water retention.  Decreasing sodium in the diet can reduce edema and risk of heart attack or stroke associated with high blood pressure.  Keep in mind that there are many other factors involved in these health problems.  Heredity, obesity, lack of exercise, cigarette smoking, stress and what you eat all play a role.  General Guidelines: Do not add salt at the table or in cooking.  One teaspoon of salt contains over 2 grams of sodium. Read food labels Avoid processed and convenience foods Ask your dietitian before eating any foods not dicussed in the menu planning guidelines Consult your physician if you wish to use a salt substitute or a sodium containing medication such as antacids.  Limit milk and milk products to 16 oz (2 cups) per day.  Shopping Hints: READ LABELS!! "Dietetic" does not necessarily mean low sodium. Salt and other sodium ingredients are often added to foods during processing.    Menu Planning Guidelines Food Group Choose More Often Avoid  Beverages (see also the milk group All fruit juices, low-sodium, salt-free vegetables juices, low-sodium carbonated beverages Regular vegetable or tomato juices, commercially softened water used for drinking or cooking  Breads and Cereals Enriched white, wheat, rye and  pumpernickel bread, hard rolls and dinner rolls; muffins, cornbread and waffles; most dry cereals, cooked cereal without added salt; unsalted crackers and breadsticks; low sodium or homemade bread crumbs Bread, rolls and crackers with salted tops; quick breads; instant hot cereals; pancakes; commercial bread stuffing; self-rising flower and biscuit mixes; regular bread crumbs or cracker crumbs   Desserts and Sweets Desserts and sweets mad with mild should be within allowance Instant pudding mixes and cake mixes  Fats Butter or margarine; vegetable oils; unsalted salad dressings, regular salad dressings limited to 1 Tbs; light, sour and heavy cream Regular salad dressings containing bacon fat, bacon bits, and salt pork; snack dips made with instant soup mixes or processed cheese; salted nuts  Fruits Most fresh, frozen and canned fruits Fruits processed with salt or sodium-containing ingredient (some dried fruits are processed with sodium sulfites        Vegetables Fresh, frozen vegetables and low- sodium canned vegetables Regular canned vegetables, sauerkraut, pickled vegetables, and others prepared in brine; frozen vegetables in sauces; vegetables seasoned with ham, bacon or salt pork  Condiments, Sauces, Miscellaneous  Salt substitute with physician's approval; pepper, herbs, spices; vinegar, lemon or lime juice; hot pepper sauce; garlic powder, onion powder, low sodium soy sauce (1 Tbs.); low sodium condiments (ketchup, chili sauce, mustard) in limited amounts (1 tsp.) fresh ground horseradish; unsalted tortilla chips, pretzels, potato chips, popcorn, salsa (1/4 cup) Any seasoning made with salt including garlic salt, celery salt, onion salt, and seasoned salt; sea salt, rock salt, kosher salt; meat tenderizers; monosodium glutamate; mustard, regular soy sauce, barbecue, sauce, chili sauce, teriyaki sauce, steak sauce, Worcestershire sauce, and most flavored vinegars; canned gravy and mixes; regular condiments; salted snack foods, olives, picles, relish, horseradish sauce, catsup   Food preparation: Try these seasonings Meats:    Pork Sage, onion Serve with applesauce  Chicken Poultry seasoning, thyme, parsley Serve with cranberry sauce  Lamb Curry powder, rosemary, garlic, thyme Serve with mint sauce or jelly  Veal Marjoram, basil Serve with current jelly, cranberry sauce  Beef  Pepper, bay leaf Serve with dry mustard, unsalted chive butter  Fish [place] leaf, dill Serve with unsalted lemon butter, unsalted parsley butter  Vegetables:    Asparagus Lemon juice   Broccoli Lemon juice   Carrots Mustard dressing parsley, mint, nutmeg, glazed with unsalted butter and sugar   Green beans Marjoram, lemon juice, nutmeg,dill seed   Tomatoes Basil, marjoram, onion   Spice /blend for [employer]" 4 tsp ground thyme 1 tsp ground sage 3 tsp ground rosemary 4 tsp ground marjoram   Test your knowledge A product that says "Salt Free" may still contain sodium. True or False Garlic Powder and Hot Pepper Sauce an be used as alternative seasonings.True or False Processed foods have more sodium than fresh foods.  True or False Canned Vegetables have less sodium than froze True or False   WAYS TO DECREASE YOUR SODIUM INTAKE Avoid the use of added salt in cooking and at the table.  Table salt (and other prepared seasonings which contain salt) is probably one of the greatest sources of sodium in the diet.  Unsalted foods can gain flavor from the sweet, sour, and butter taste sensations of herbs and spices.  Instead of using salt for seasoning, try the following seasonings with the foods listed.  Remember: how you use them to enhance natural food flavors is limited only by your creativity... Allspice-Meat, fish, eggs, fruit, peas, red and yellow vegetables Almond Extract-Fruit baked goods Anise Seed-Sweet  breads, fruit, carrots, beets, cottage cheese, cookies (tastes like licorice) Basil-Meat, fish, eggs, vegetables, rice, vegetables salads, soups, sauces Bay Leaf-Meat, fish, stews, poultry Burnet-Salad, vegetables (cucumber-like flavor) Caraway Seed-Bread, cookies, cottage cheese, meat, vegetables, cheese, rice Cardamon-Baked goods, fruit, soups Celery Powder or seed-Salads, salad dressings, sauces, meatloaf, soup, bread.Do not use  celery salt Chervil-Meats, salads, fish, eggs,  vegetables, cottage cheese (parsley-like flavor) Chili Power-Meatloaf, chicken cheese, corn, eggplant, egg dishes Chives-Salads cottage cheese, egg dishes, soups, vegetables, sauces Cilantro-Salsa, casseroles Cinnamon-Baked goods, fruit, pork, lamb, chicken, carrots Cloves-Fruit, baked goods, fish, pot roast, green beans, beets, carrots Coriander-Pastry, cookies, meat, salads, cheese (lemon-orange flavor) Cumin-Meatloaf, fish,cheese, eggs, cabbage,fruit pie (caraway flavor) [employer], fruit, eggs, fish, poultry, cottage cheese, vegetables Dill Seed-Meat, cottage cheese, poultry, vegetables, fish, salads, bread Fennel Seed-Bread, cookies, apples, pork, eggs, fish, beets, cabbage, cheese, Licorice-like flavor Garlic-(buds or powder) Salads, meat, poultry, fish, bread, butter, vegetables, potatoes.Do not  use garlic salt Ginger-Fruit, vegetables, baked goods, meat, fish, poultry Horseradish Root-Meet, vegetables, butter Lemon Juice or Extract-Vegetables, fruit, tea, baked goods, fish salads Mace-Baked goods fruit, vegetables, fish, poultry (taste like nutmeg) Maple Extract-Syrups Marjoram-Meat, chicken, fish, vegetables, breads, green salads (taste like Sage) Mint-Tea, lamb, sherbet, vegetables, desserts, carrots, cabbage Mustard, Dry or Seed-Cheese, eggs, meats, vegetables, poultry Nutmeg-Baked goods, fruit, chicken, eggs, vegetables, desserts Onion Powder-Meat, fish, poultry, vegetables, cheese, eggs, bread, rice salads (Do not use   Onion salt) Orange Extract-Desserts, baked goods Oregano-Pasta, eggs, cheese, onions, pork, lamb, fish, chicken, vegetables, green salads Paprika-Meat, fish, poultry, eggs, cheese, vegetables Parsley Flakes-Butter, vegetables, meat fish, poultry, eggs, bread, salads (certain forms may   Contain sodium Pepper-Meat fish, poultry, vegetables, eggs Peppermint Extract-Desserts, baked goods Poppy Seed-Eggs, bread, cheese, fruit dressings, baked goods,  noodles, vegetables, cottage  [employer], poultry, meat, fish, cauliflower, turnips,eggs bread Saffron-Rice, bread, veal, chicken, fish, eggs Sage-Meat, fish, poultry, onions, eggplant, tomateos, pork, stews Savory-Eggs, salads, poultry, meat, rice, vegetables, soups, pork Tarragon-Meat, poultry, fish, eggs, butter, vegetables (licorice-like flavor)  Thyme-Meat, poultry, fish, eggs, vegetables, (clover-like flavor), sauces, soups Tumeric-Salads, butter, eggs, fish, rice, vegetables (saffron-like flavor) Vanilla Extract-Baked goods, candy Vinegar-Salads, vegetables, meat marinades Walnut Extract-baked goods, candy   2. Choose your Foods Wisely   The following is a list of foods to avoid which are high in sodium:  Meats-Avoid all smoked, canned, salt cured, dried and kosher meat and fish as well as Anchovies   [employer] meats:Bologna, Liverwurst, Pastrami Canned meat or fish  Marinated herring Caviar    Pepperoni Corned Beef   Pizza Dried chipped beef  Salami Frozen breaded fish or meat Salt pork Frankfurters or hot dogs  Sardines Gefilte fish   Sausage Ham (boiled ham, Proscuitto Smoked butt    spiced ham)   Spam      TV Dinners Vegetables Canned vegetables (Regular) Relish Canned mushrooms  Sauerkraut Olives    Tomato juice Pickles  Bakery and Dessert Products Canned puddings  Cream pies Cheesecake   Decorated cakes Cookies  Beverages/Juices Tomato juice, regular  Gatorade   V-8 vegetable juice, regular  Breads and Cereals Biscuit mixes   Salted potato chips, corn chips, pretzels Bread stuffing mixes  Salted crackers and rolls Pancake and waffle mixes Self-rising flour  Seasonings Accent    Meat sauces Barbecue sauce  Meat tenderizer Catsup    Monosodium glutamate (MSG) Celery salt   Onion salt Chili sauce   Prepared mustard Garlic salt   Salt, seasoned salt, sea salt Gravy mixes  Soy  sauce Horseradish   Steak sauce Ketchup   Tartar sauce Lite salt    Teriyaki sauce Marinade mixes   Worcestershire sauce  Others Baking powder   Cocoa and cocoa mixes Baking soda   Commercial casserole mixes Candy-caramels, chocolate  Dehydrated soups    Bars, fudge,nougats  Instant rice and pasta mixes Canned broth or soup  Maraschino cherries Cheese, aged and processed cheese and cheese spreads  Learning Assessment Quiz  Indicated T (for True) or F (for False) for each of the following statements:  _____ Fresh fruits and vegetables and unprocessed grains are generally low in sodium _____ Water may contain a considerable amount of sodium, depending on the source _____ You can always tell if a food is high in sodium by tasting it _____ Certain laxatives my be high in sodium and should be avoided unless prescribed   by a physician or pharmacist _____ Salt substitutes may be used freely by anyone on a sodium restricted diet _____ Sodium is present in table salt, food additives and as a natural component of   most foods _____ Table salt is approximately 90% sodium _____ Limiting sodium intake may help prevent excess fluid accumulation in the body _____ On a sodium-restricted diet, seasonings such as bouillon soy sauce, and    cooking wine should be used in place of table salt _____ On an ingredient list, a product which lists monosodium glutamate as the first   ingredient is an appropriate food to include on a low sodium diet  Circle the best answer(s) to the following statements (Hint: there may be more than one correct answer)  11. On a low-sodium diet, some acceptable snack items are:    A. Olives  F. Bean dip   K. Grapefruit juice    B. Salted Pretzels G. Commercial Popcorn   L. Canned peaches    C. Carrot Sticks  H. Bouillon   M. Unsalted nuts   D. [place] fries  I. Peanut butter crackers N. Salami   E. Sweet pickles J. Tomato Juice   O. Pizza  12.  Seasonings that may be  used freely on a reduced - sodium diet include   A. Lemon wedges F.Monosodium glutamate K. Celery seed    B.Soysauce   G. Pepper   L. Mustard powder   C. Sea salt  H. Cooking wine  M. Onion flakes   D. Vinegar  E. Prepared horseradish N. Salsa   E. Sage   J. Worcestershire sauce  O. Chutney

## 2022-04-20 ENCOUNTER — Ambulatory Visit
Admission: RE | Admit: 2022-04-20 | Discharge: 2022-04-20 | Disposition: A | Discharge status: Home / Self Care | Payer: Medicare Other | Source: Ambulatory Visit | Attending: Nephrology | Admitting: Nephrology

## 2022-04-20 DIAGNOSIS — F331 Major depressive disorder, recurrent, moderate: Secondary | ICD-10-CM | POA: Diagnosis not present

## 2022-04-20 DIAGNOSIS — N1832 Chronic kidney disease, stage 3b: Secondary | ICD-10-CM

## 2022-04-20 DIAGNOSIS — N281 Cyst of kidney, acquired: Secondary | ICD-10-CM | POA: Diagnosis not present

## 2022-04-20 DIAGNOSIS — F411 Generalized anxiety disorder: Secondary | ICD-10-CM | POA: Diagnosis not present

## 2022-04-21 DIAGNOSIS — F332 Major depressive disorder, recurrent severe without psychotic features: Secondary | ICD-10-CM | POA: Diagnosis not present

## 2022-04-22 DIAGNOSIS — M62562 Muscle wasting and atrophy, not elsewhere classified, left lower leg: Secondary | ICD-10-CM | POA: Diagnosis not present

## 2022-04-22 DIAGNOSIS — M62551 Muscle wasting and atrophy, not elsewhere classified, right thigh: Secondary | ICD-10-CM | POA: Diagnosis not present

## 2022-04-22 DIAGNOSIS — M62561 Muscle wasting and atrophy, not elsewhere classified, right lower leg: Secondary | ICD-10-CM | POA: Diagnosis not present

## 2022-04-22 DIAGNOSIS — R262 Difficulty in walking, not elsewhere classified: Secondary | ICD-10-CM | POA: Diagnosis not present

## 2022-04-22 DIAGNOSIS — Z471 Aftercare following joint replacement surgery: Secondary | ICD-10-CM | POA: Diagnosis not present

## 2022-04-22 DIAGNOSIS — M62552 Muscle wasting and atrophy, not elsewhere classified, left thigh: Secondary | ICD-10-CM | POA: Diagnosis not present

## 2022-04-25 ENCOUNTER — Ambulatory Visit
Admission: EM | Admit: 2022-04-25 | Discharge: 2022-04-25 | Disposition: A | Discharge status: Home / Self Care | Payer: Medicare Other | Attending: Urgent Care | Admitting: Urgent Care

## 2022-04-25 ENCOUNTER — Ambulatory Visit (INDEPENDENT_AMBULATORY_CARE_PROVIDER_SITE_OTHER): Payer: Medicare Other

## 2022-04-25 DIAGNOSIS — R059 Cough, unspecified: Secondary | ICD-10-CM

## 2022-04-25 DIAGNOSIS — B349 Viral infection, unspecified: Secondary | ICD-10-CM | POA: Diagnosis not present

## 2022-04-25 MED ORDER — CETIRIZINE HCL 10 MG PO TABS: 10.0000 mg | ORAL_TABLET | Freq: Every day | ORAL | 0 refills | Status: AC

## 2022-04-25 MED ORDER — FLUTICASONE PROPIONATE 50 MCG/ACT NA SUSP: 2.0000 | Freq: Every day | NASAL | 12 refills | Status: DC

## 2022-04-25 MED ORDER — IPRATROPIUM BROMIDE 0.03 % NA SOLN: 2.0000 | Freq: Two times a day (BID) | NASAL | 0 refills | Status: DC

## 2022-04-25 NOTE — ED Triage Notes (Signed)
Pt c/o nasal congestion and nonproductive cough x2 days. Has not taken any medications for same.

## 2022-04-25 NOTE — ED Provider Notes (Signed)
[facility]  Note:  This document was prepared using Conservation officer, historic buildings and may include unintentional dictation errors.  MRN: [id number] DOB: Aug 20, 1950  Subjective:   [name] is a 72 y.o. male presenting for 2 day history of acute onset sinus congestion, persistent coughing, slight malaise. Denies headache, fever, chest pain, shob, wheezing, body aches. Has a history of allergies but is not taking his medications. Has history of chronic heart failure but no dyspnea.   No current facility-administered medications for this encounter.  Current Outpatient Medications:    acetaminophen (TYLENOL) 500 MG tablet, Take 1-2 tablets (500-1,000 mg total) by mouth every 6 (six) hours as needed for moderate pain., Disp: 30 tablet, Rfl: 0   amLODipine (NORVASC) 5 MG tablet, Take 1 tablet (5 mg total) by mouth daily., Disp: 90 tablet, Rfl: 3   busPIRone (BUSPAR) 30 MG tablet, Take 30 mg by mouth 2 (two) times daily., Disp: , Rfl:    candesartan (ATACAND) 32 MG tablet, Take 32 mg by mouth daily., Disp: , Rfl:    clopidogrel (PLAVIX) 75 MG tablet, Take 75 mg by mouth daily., Disp: , Rfl:    desloratadine (CLARINEX) 5 MG tablet, Take 5 mg by mouth daily as needed (allergies)., Disp: , Rfl:    flunisolide (NASALIDE) 25 MCG/ACT (0.025%) SOLN, 2 sprays 2 (two) times daily., Disp: , Rfl:    folic acid (FOLVITE) 1 MG tablet, Take 1 mg by mouth daily., Disp: , Rfl:    fondaparinux (ARIXTRA) 2.5 MG/0.5ML SOLN injection, Inject 0.5 mLs (2.5 mg total) into the skin daily before breakfast for 18 days. Then resume Plavix once a day., Disp: 9 mL, Rfl: 0   furosemide (LASIX) 40 MG tablet, Take 40 mg by mouth daily., Disp: , Rfl:    hydrALAZINE (APRESOLINE) 50 MG tablet, Take 1 tablet (50 mg total) by mouth 2 (two) times daily., Disp: 270 tablet, Rfl: 3   hydroxychloroquine (PLAQUENIL) 200 MG tablet, Take 400 mg by mouth at bedtime., Disp: , Rfl:    methocarbamol (ROBAXIN)  500 MG tablet, Take 1 tablet (500 mg total) by mouth every 6 (six) hours as needed for muscle spasms. (Patient not taking: Reported on 04/19/2022), Disp: 40 tablet, Rfl: 0   methotrexate (RHEUMATREX) 2.5 MG tablet, Take 4 tabs Orally Once a week, get labs after 4 weeks and then will increase dose for 30 days (Patient not taking: Reported on 04/19/2022), Disp: , Rfl:    metoprolol succinate (TOPROL XL) 25 MG 24 hr tablet, Take 1 tablet (25 mg total) by mouth daily., Disp: 90 tablet, Rfl: 3   PARoxetine (PAXIL-CR) 37.5 MG 24 hr tablet, Take 1 tablet (37.5 mg total) by mouth at bedtime., Disp: 30 tablet, Rfl: 3   primidone (MYSOLINE) 250 MG tablet, TAKE 1 TABLET BY MOUTH TWICE A DAY, Disp: 56 tablet, Rfl: 3   QUEtiapine (SEROQUEL) 25 MG tablet, TAKE 3 TABLETS BY MOUTH AT BEDTIME (Patient taking differently: Take 75 mg by mouth at bedtime.), Disp: 90 tablet, Rfl: 3   rosuvastatin (CRESTOR) 40 MG tablet, Take 40 mg by mouth daily., Disp: , Rfl:    sildenafil (VIAGRA) 50 MG tablet, Take 100 mg by mouth as needed for erectile dysfunction., Disp: , Rfl:    spironolactone (ALDACTONE) 25 MG tablet, Take 25 mg by mouth daily., Disp: , Rfl:    Suvorexant (BELSOMRA) 10 MG TABS, Take 1 tablet by mouth at bedtime., Disp: 30 tablet, Rfl: 2   traZODone (DESYREL)  150 MG tablet, Take 1 tablet (150 mg total) by mouth at bedtime as needed. for sleep, Disp: 30 tablet, Rfl: 3   valACYclovir (VALTREX) 1000 MG tablet, Take 1,000 mg by mouth daily., Disp: , Rfl:    Vitamin D, Ergocalciferol, (DRISDOL) 1.25 MG (50000 UNIT) CAPS capsule, Take 50,000 Units by mouth every Saturday., Disp: , Rfl:    Allergies  Allergen Reactions   Amitriptyline Other (See Comments)   Aspirin Other (See Comments)    Dizziness, nausea and vomiting Balance issues   Lisinopril Other (See Comments)    Critical Cough   Lithium Other (See Comments)    Critical Per pt reaction unknown   Morphine Other (See Comments)   Nsaids Other (See Comments)    Oxycodone Nausea And Vomiting   Oxycodone Hcl Other (See Comments)   Oxycodone-Acetaminophen Nausea And Vomiting and Other (See Comments)    Also causes dizziness   Pork Allergy Other (See Comments)   Pork-Derived Products Other (See Comments)    Unspecified reaction    Past Medical History:  Diagnosis Date   (HFpEF) heart failure with preserved ejection fraction    Arthritis    Depression    Frequent urination    GERD (gastroesophageal reflux disease)    Headache    mirgaines in the past   Herpes    History of nuclear stress test    Myoview 05/2021: EF 77, normal perfusion, low risk   Hyperlipidemia    Hypertension    Memory loss    Obesity    Stroke    Urgency of urination      Past Surgical History:  Procedure Laterality Date   CHOLECYSTECTOMY N/A 12/17/2019   Procedure: LAPAROSCOPIC CHOLECYSTECTOMY WITH ICG INJECTION;  Surgeon: Emelia Loron, MD;  Location: [facility] OR;  Service: General;  Laterality: N/A;   disckec     shouler arthroscopy Left    SPINAL FUSION     TONSILLECTOMY     TOTAL KNEE ARTHROPLASTY Right 11/15/2021   Procedure: TOTAL KNEE ARTHROPLASTY;  Surgeon: Ollen Gross, MD;  Location: [facility];  Service: Orthopedics;  Laterality: Right;   VASECTOMY     no per pt   WISDOM TOOTH EXTRACTION      Family History  Problem Relation Age of Onset   Hypertension Mother    Hypertension Father    Hypertension Sister    Hypertension Brother    Cancer Maternal Aunt    Cancer - Ovarian Maternal Grandmother    Diabetes Mellitus I Paternal Grandmother    Hypertension Paternal Grandfather    Cancer Maternal Aunt     Social History   Tobacco Use   Smoking status: Former   Smokeless tobacco: Never  Building services engineer Use: Never used  Substance Use Topics   Alcohol use: No    Alcohol/week: 1.0 standard drink of alcohol    Types: 1 Cans of beer per week   Drug use: No    ROS   Objective:   Vitals: BP 116/74 (BP Location: Right Arm)   Pulse 65    Temp 98.1 F (36.7 C) (Oral)   Resp 18   SpO2 96%   Physical Exam Constitutional:      General: He is not in acute distress.    Appearance: Normal appearance. He is well-developed and normal weight. He is not ill-appearing, toxic-appearing or diaphoretic.  HENT:     Head: Normocephalic and atraumatic.     Right Ear: Tympanic membrane, ear canal and external  ear normal. No drainage, swelling or tenderness. No middle ear effusion. There is no impacted cerumen. Tympanic membrane is not erythematous or bulging.     Left Ear: Tympanic membrane, ear canal and external ear normal. No drainage, swelling or tenderness.  No middle ear effusion. There is no impacted cerumen. Tympanic membrane is not erythematous or bulging.     Nose: Congestion present. No rhinorrhea.     Mouth/Throat:     Mouth: Mucous membranes are moist.     Pharynx: No oropharyngeal exudate or posterior oropharyngeal erythema.  Eyes:     General: No scleral icterus.       Right eye: No discharge.        Left eye: No discharge.     Extraocular Movements: Extraocular movements intact.     Conjunctiva/sclera: Conjunctivae normal.  Cardiovascular:     Rate and Rhythm: Normal rate and regular rhythm.     Heart sounds: Normal heart sounds. No murmur heard.    No friction rub. No gallop.  Pulmonary:     Effort: Pulmonary effort is normal. No respiratory distress.     Breath sounds: Normal breath sounds. No stridor. No wheezing, rhonchi or rales.  Musculoskeletal:     Cervical back: Normal range of motion and neck supple. No rigidity. No muscular tenderness.  Neurological:     General: No focal deficit present.     Mental Status: He is alert and oriented to person, place, and time.  Psychiatric:        Mood and Affect: Mood normal.        Behavior: Behavior normal.        Thought Content: Thought content normal.     DG Chest 2 View  Result Date: 04/25/2022 CLINICAL DATA:  Nasal congestion and non productive cough for 2  days. EXAM: CHEST - 2 VIEW COMPARISON:  Chest radiograph 12/14/2019 FINDINGS: The cardiomediastinal silhouette is normal. There is no focal consolidation or pulmonary edema. There is no pleural effusion or pneumothorax There is no acute osseous abnormality. IMPRESSION: No radiographic evidence of acute cardiopulmonary process. Electronically Signed   By: Lesia HausenPeter  Noone M.D.   On: 04/25/2022 15:34     Assessment and Plan :   PDMP not reviewed this encounter.  1. Acute viral syndrome    Patient declined a COVID test.  Chest x-ray negative. Suspect viral URI, viral syndrome. Physical exam findings reassuring and vital signs stable for discharge. Advised supportive care, offered symptomatic relief. Counseled patient on potential for adverse effects with medications prescribed/recommended today, ER and return-to-clinic precautions discussed, patient verbalized understanding.      Wallis BambergMani, Samvel Zinn, [place] JerseyPA-C 04/25/22 [phone]

## 2022-04-25 NOTE — Discharge Instructions (Signed)
We will manage this as a viral syndrome. For sore throat or cough try using a honey-based tea. Use 3 teaspoons of honey with juice squeezed from half lemon. Place shaved pieces of ginger into 1/2-1 cup of water and warm over stove top. Then mix the ingredients and repeat every 4 hours as needed. Please take Tylenol 500mg -650mg  once every 6 hours for fevers, aches and pains. Hydrate very well with at least 2 liters (64 ounces) of water. Eat light meals such as soups (chicken and noodles, chicken wild rice, vegetable).  Do not eat any foods that you are allergic to.  Start an antihistamine like Zyrtec (10mg  daily) for postnasal drainage, sinus congestion.  You can take this together with pseudoephedrine (Sudafed) at a dose of 30 mg 3 times a day or twice daily as needed for the same kind of congestion. For now use Atrovent nasal spray since you are sick. Can use 2 sprays into each nostril twice daily as needed. Once you fully recover, in 10-14 days you can start Flonase nasal spray to help your allergies.  Use 2 sprays into each nostril daily and long term with Zyrtec.

## 2022-04-26 DIAGNOSIS — R262 Difficulty in walking, not elsewhere classified: Secondary | ICD-10-CM | POA: Diagnosis not present

## 2022-04-26 DIAGNOSIS — M62552 Muscle wasting and atrophy, not elsewhere classified, left thigh: Secondary | ICD-10-CM | POA: Diagnosis not present

## 2022-04-26 DIAGNOSIS — Z471 Aftercare following joint replacement surgery: Secondary | ICD-10-CM | POA: Diagnosis not present

## 2022-04-26 DIAGNOSIS — M62561 Muscle wasting and atrophy, not elsewhere classified, right lower leg: Secondary | ICD-10-CM | POA: Diagnosis not present

## 2022-04-26 DIAGNOSIS — M62551 Muscle wasting and atrophy, not elsewhere classified, right thigh: Secondary | ICD-10-CM | POA: Diagnosis not present

## 2022-04-26 DIAGNOSIS — M62562 Muscle wasting and atrophy, not elsewhere classified, left lower leg: Secondary | ICD-10-CM | POA: Diagnosis not present

## 2022-04-27 ENCOUNTER — Inpatient Hospital Stay: Payer: Medicare Other | Attending: Nurse Practitioner | Admitting: Nurse Practitioner

## 2022-04-27 ENCOUNTER — Other Ambulatory Visit: Payer: Self-pay

## 2022-04-27 ENCOUNTER — Inpatient Hospital Stay: Payer: Medicare Other

## 2022-04-27 ENCOUNTER — Encounter: Payer: Self-pay | Admitting: Nurse Practitioner

## 2022-04-27 VITALS — BP 117/74 | HR 65 | Temp 98.4°F | Resp 18 | Ht 67.0 in | Wt 192.6 lb

## 2022-04-27 DIAGNOSIS — Z87891 Personal history of nicotine dependence: Secondary | ICD-10-CM | POA: Insufficient documentation

## 2022-04-27 DIAGNOSIS — M069 Rheumatoid arthritis, unspecified: Secondary | ICD-10-CM | POA: Diagnosis not present

## 2022-04-27 DIAGNOSIS — Z7901 Long term (current) use of anticoagulants: Secondary | ICD-10-CM | POA: Insufficient documentation

## 2022-04-27 DIAGNOSIS — Z7902 Long term (current) use of antithrombotics/antiplatelets: Secondary | ICD-10-CM | POA: Insufficient documentation

## 2022-04-27 DIAGNOSIS — N189 Chronic kidney disease, unspecified: Secondary | ICD-10-CM | POA: Diagnosis not present

## 2022-04-27 DIAGNOSIS — Z8673 Personal history of transient ischemic attack (TIA), and cerebral infarction without residual deficits: Secondary | ICD-10-CM | POA: Insufficient documentation

## 2022-04-27 DIAGNOSIS — I130 Hypertensive heart and chronic kidney disease with heart failure and stage 1 through stage 4 chronic kidney disease, or unspecified chronic kidney disease: Secondary | ICD-10-CM | POA: Diagnosis not present

## 2022-04-27 DIAGNOSIS — D472 Monoclonal gammopathy: Secondary | ICD-10-CM | POA: Insufficient documentation

## 2022-04-27 DIAGNOSIS — Z79899 Other long term (current) drug therapy: Secondary | ICD-10-CM | POA: Diagnosis not present

## 2022-04-27 DIAGNOSIS — Z79624 Long term (current) use of inhibitors of nucleotide synthesis: Secondary | ICD-10-CM | POA: Insufficient documentation

## 2022-04-27 DIAGNOSIS — R778 Other specified abnormalities of plasma proteins: Secondary | ICD-10-CM | POA: Diagnosis not present

## 2022-04-27 DIAGNOSIS — I5032 Chronic diastolic (congestive) heart failure: Secondary | ICD-10-CM | POA: Insufficient documentation

## 2022-04-27 DIAGNOSIS — E559 Vitamin D deficiency, unspecified: Secondary | ICD-10-CM | POA: Insufficient documentation

## 2022-04-27 NOTE — Progress Notes (Addendum)
[facility]   Telephone:(336) [phone] Fax:(336) [phone]   Clinic New consult Note   Patient Care Team: Knox RoyaltyJones, Enrico, MD as PCP - General (Family Medicine) Christell Constanthandrasekhar, Mahesh A, MD as PCP - Cardiology (Cardiology) Marcellina Millineddy, Keshavpal Gunna, MD (Psychiatry) 04/27/2022  CHIEF COMPLAINTS/PURPOSE OF CONSULTATION:  Elevated M protein, r/o multiple myeloma; referred by Nephrologist Dr. Bufford ButtnerElizabeth Upton  HISTORY OF PRESENTING ILLNESS:  [name] 72 y.o. male with PMH including HTN, HL, CHF with preserved EF, CVA, OA and RA, IBS-diarrhea, anxiety and depression, vitamin D deficiency, and CKD is here because of elevated M spike found on labs from nephrology collected 03/11/2022 which showed M spike 0.9, elevated free kappa light chain 36.5, normal lambda light chains, and elevated kappa/lambda ratio to 1.7.  Other labs show serum creatinine 1.5 to, normal calcium 9.1, mild anemia with hemoglobin 12.4 and normal iron panel.   Socially, he lives alone in independent living facility where he goes downstairs to meals and PT/OT.  Has 1 daughter who is very involved in his care and is POA.  He is retired from her education but works part-time at [employer] where he enjoys golfing when able.  He is independent with ADLs and drives.  Denies alcohol or drug use, former cigarette smoker but quit 34 years ago. Two aunts had cancer, one had lung and not sure of other.  Today he presents with his daughter.  Feeling in his usual state of health except suffering from allergies.  Still recovering from knee surgery, and some left thumb RA related pain but otherwise denies pain.   Energy and appetite are stable for patient, no unintentional weight loss.  He feels cold but no fever or chills.  Denies chest pain, dyspnea, nausea/vomiting.   MEDICAL HISTORY:  Past Medical History:  Diagnosis Date   (HFpEF) heart failure with preserved ejection fraction    Allergy    Anxiety    Arthritis     Chronic kidney disease    Depression    Frequent urination    GERD (gastroesophageal reflux disease)    Headache    mirgaines in the past   Herpes    History of nuclear stress test    Myoview 05/2021: EF 77, normal perfusion, low risk   Hyperlipidemia    Hypertension    Memory loss    Obesity    Stroke    Urgency of urination     SURGICAL HISTORY: Past Surgical History:  Procedure Laterality Date   CHOLECYSTECTOMY N/A 12/17/2019   Procedure: LAPAROSCOPIC CHOLECYSTECTOMY WITH ICG INJECTION;  Surgeon: Emelia LoronWakefield, Matthew, MD;  Location: [facility] OR;  Service: General;  Laterality: N/A;   disckec     shouler arthroscopy Left    SPINAL FUSION     TONSILLECTOMY     TOTAL KNEE ARTHROPLASTY Right 11/15/2021   Procedure: TOTAL KNEE ARTHROPLASTY;  Surgeon: Ollen GrossAluisio, Frank, MD;  Location: [facility] ORS;  Service: Orthopedics;  Laterality: Right;   VASECTOMY     no per pt   WISDOM TOOTH EXTRACTION      SOCIAL HISTORY: Social History   Socioeconomic History   Marital status: Divorced    Spouse name: Not on file   Number of children: 1   Years of education: Masters   Highest education level: Not on file  Occupational History   Occupation: retired    Associate Professormployer: RETIRED  Tobacco Use   Smoking status: Former   Smokeless tobacco: Never  Building services engineerVaping Use   Vaping Use:  Never used  Substance and Sexual Activity   Alcohol use: No    Alcohol/week: 1.0 standard drink of alcohol    Types: 1 Cans of beer per week   Drug use: No   Sexual activity: Never  Other Topics Concern   Not on file  Social History Narrative   Patient lives in Independent living @ [facility]   Patient is right-handed.   Patient does not drink any caffeine.   Social Determinants of Health   Financial Resource Strain: Not on file  Food Insecurity: No Food Insecurity (11/15/2021)   Hunger Vital Sign    Worried About Running Out of Food in the Last Year: Never true    Ran Out of Food in the Last Year: Never true   Transportation Needs: No Transportation Needs (11/15/2021)   PRAPARE - Administrator, Civil Service (Medical): No    Lack of Transportation (Non-Medical): No  Physical Activity: Not on file  Stress: Not on file  Social Connections: Not on file  Intimate Partner Violence: Not At Risk (11/15/2021)   Humiliation, Afraid, Rape, and Kick questionnaire    Fear of Current or Ex-Partner: No    Emotionally Abused: No    Physically Abused: No    Sexually Abused: No    FAMILY HISTORY: Family History  Problem Relation Age of Onset   Hypertension Mother    Hypertension Father    Hypertension Sister    Hypertension Brother    Cancer Maternal Aunt    Cancer - Ovarian Maternal Grandmother    Diabetes Mellitus I Paternal Grandmother    Hypertension Paternal Grandfather    Cancer Maternal Aunt     ALLERGIES:  is allergic to amitriptyline, aspirin, lisinopril, lithium, morphine, nsaids, oxycodone, oxycodone hcl, oxycodone-acetaminophen, pork allergy, and pork-derived products.  MEDICATIONS:  Current Outpatient Medications  Medication Sig Dispense Refill   acetaminophen (TYLENOL) 500 MG tablet Take 1-2 tablets (500-1,000 mg total) by mouth every 6 (six) hours as needed for moderate pain. 30 tablet 0   amLODipine (NORVASC) 5 MG tablet Take 1 tablet (5 mg total) by mouth daily. 90 tablet 3   busPIRone (BUSPAR) 30 MG tablet Take 30 mg by mouth 2 (two) times daily.     candesartan (ATACAND) 32 MG tablet Take 32 mg by mouth daily.     cetirizine (ZYRTEC ALLERGY) 10 MG tablet Take 1 tablet (10 mg total) by mouth daily. 30 tablet 0   clopidogrel (PLAVIX) 75 MG tablet Take 75 mg by mouth daily.     fluticasone (FLONASE) 50 MCG/ACT nasal spray Place 2 sprays into both nostrils daily. 16 g 12   folic acid (FOLVITE) 1 MG tablet Take 1 mg by mouth daily.     furosemide (LASIX) 40 MG tablet Take 40 mg by mouth daily.     hydrALAZINE (APRESOLINE) 50 MG tablet Take 1 tablet (50 mg total) by  mouth 2 (two) times daily. 270 tablet 3   hydroxychloroquine (PLAQUENIL) 200 MG tablet Take 400 mg by mouth at bedtime.     ipratropium (ATROVENT) 0.03 % nasal spray Place 2 sprays into both nostrils 2 (two) times daily. 30 mL 0   metoprolol succinate (TOPROL XL) 25 MG 24 hr tablet Take 1 tablet (25 mg total) by mouth daily. 90 tablet 3   PARoxetine (PAXIL-CR) 37.5 MG 24 hr tablet Take 1 tablet (37.5 mg total) by mouth at bedtime. 30 tablet 3   primidone (MYSOLINE) 250 MG tablet TAKE 1 TABLET BY  MOUTH TWICE A DAY 56 tablet 3   QUEtiapine (SEROQUEL) 25 MG tablet TAKE 3 TABLETS BY MOUTH AT BEDTIME (Patient taking differently: Take 75 mg by mouth at bedtime.) 90 tablet 3   rosuvastatin (CRESTOR) 40 MG tablet Take 40 mg by mouth daily.     sildenafil (VIAGRA) 50 MG tablet Take 100 mg by mouth as needed for erectile dysfunction.     spironolactone (ALDACTONE) 25 MG tablet Take 25 mg by mouth daily.     Suvorexant (BELSOMRA) 10 MG TABS Take 1 tablet by mouth at bedtime. 30 tablet 2   traZODone (DESYREL) 150 MG tablet Take 1 tablet (150 mg total) by mouth at bedtime as needed. for sleep 30 tablet 3   valACYclovir (VALTREX) 1000 MG tablet Take 1,000 mg by mouth daily.     Vitamin D, Ergocalciferol, (DRISDOL) 1.25 MG (50000 UNIT) CAPS capsule Take 50,000 Units by mouth every Saturday.     fondaparinux (ARIXTRA) 2.5 MG/0.5ML SOLN injection Inject 0.5 mLs (2.5 mg total) into the skin daily before breakfast for 18 days. Then resume Plavix once a day. 9 mL 0   No current facility-administered medications for this visit.    REVIEW OF SYSTEMS:   Constitutional: Denies unintentional weight loss, fevers, chills or abnormal night sweats Eyes: Denies blurriness of vision, double vision or watery eyes Ears, nose, mouth, throat, and face: Denies mucositis or sore throat Respiratory: Denies dyspnea or wheezes (+) seasonal allergy related cough Cardiovascular: Denies palpitation, chest discomfort or lower extremity  swelling Gastrointestinal:  Denies nausea, vomiting, constipation, hematochezia, heartburn or change in bowel habits (+) IBS-diarrhea Skin: Denies abnormal skin rashes MSK (+) OA (+) RA Lymphatics: Denies new lymphadenopathy or easy bruising Neurological:Denies numbness, tingling or new weaknesses Behavioral/Psych: Mood is stable, no new changes (+) anxiety (+) depression (+) insomnia All other systems were reviewed with the patient and are negative.  PHYSICAL EXAMINATION: ECOG PERFORMANCE STATUS: 0 - Asymptomatic  Vitals:   04/27/22 1249  BP: 117/74  Pulse: 65  Resp: 18  Temp: 98.4 F (36.9 C)  SpO2: 95%   Filed Weights   04/27/22 1249  Weight: 192 lb 9.6 oz (87.4 kg)    GENERAL:alert, no distress and comfortable SKIN: skin color, texture, turgor are normal, no rashes or significant lesions EYES: sclera clear NECK: Without mass LYMPH:  no palpable cervical or supraclavicular lymphadenopathy LUNGS: clear with normal breathing effort HEART: regular rate & rhythm, no lower extremity edema ABDOMEN:abdomen soft, non-tender and normal bowel sounds Musculoskeletal:no cyanosis of digits and no clubbing  PSYCH: alert & oriented x 3 with fluent speech NEURO: no focal motor/sensory deficits  LABORATORY DATA:  I have reviewed the data as listed    Latest Ref Rng & Units 11/17/2021    3:34 AM 11/16/2021    3:35 AM 11/02/2021    2:42 PM  CBC  WBC 4.0 - 10.5 K/uL 10.7  10.0  6.4   Hemoglobin 13.0 - 17.0 g/dL 82.9  56.2  13.0   Hematocrit 39.0 - 52.0 % 30.2  29.0  35.1   Platelets 150 - 400 K/uL 275  269  349        Latest Ref Rng & Units 11/16/2021    3:35 AM 11/02/2021    2:42 PM 05/13/2021   12:16 PM  CMP  Glucose 70 - 99 mg/dL 865  88  784   BUN 8 - 23 mg/dL 12  12  17    Creatinine 0.61 - 1.24 mg/dL 6.96  2.95  1.49   Sodium 135 - 145 mmol/L 135  140  137   Potassium 3.5 - 5.1 mmol/L 3.7  3.9  4.5   Chloride 98 - 111 mmol/L 106  107  103   CO2 22 - 32 mmol/L Calcium 8.9 - 10.3 mg/dL 7.7  9.0  8.9      RADIOGRAPHIC STUDIES: I have personally reviewed the radiological images as listed and agreed with the findings in the report. DG Chest 2 View  Result Date: 04/25/2022 CLINICAL DATA:  Nasal congestion and non productive cough for 2 days. EXAM: CHEST - 2 VIEW COMPARISON:  Chest radiograph 12/14/2019 FINDINGS: The cardiomediastinal silhouette is normal. There is no focal consolidation or pulmonary edema. There is no pleural effusion or pneumothorax There is no acute osseous abnormality. IMPRESSION: No radiographic evidence of acute cardiopulmonary process. Electronically Signed   By: Lesia Hausen M.D.   On: 04/25/2022 15:34   US RENAL  Result Date: 04/22/2022 CLINICAL DATA:  CKD STAGE G3B/A1, GFR 30-44 AND ALBUMIN CREATININE RATIO <30 MG/G EXAM: RENAL / URINARY TRACT ULTRASOUND COMPLETE COMPARISON:  None Available. FINDINGS: The right kidney measured 10.0 cm and the left kidney measured 10.3 cm. The kidneys demonstrate normal echogenicity. Left kidney lower pole cyst measures 6 mm. Right kidney upper pole cyst measures 1.7 cm. Cysts do not need follow up. No shadowing stones are seen. No hydronephrosis. The urinary bladder appeared unremarkable. Bladder volumes not measured. IMPRESSION: Small bilateral renal cysts. Otherwise unremarkable examination of the kidneys and bladder. Electronically Signed   By: Layla Maw M.D.   On: 04/22/2022 22:11    ASSESSMENT & PLAN: 72 year old male  Elevated M protein and kappa/lambda light chain ratio -I reviewed his historical and outside medical record with the patient and family in detail. First SPEP showed elevated M spike 0.9 and kappa/lambda light chain ratio 1.7 -Other labs show Scr 1.5, otherwise normal CMP and mild anemia hgb 12.4 (improving since ortho surgery in 10/2021). No evidence of end organ involvement -He declined 24 urine for UPEP/light chains, it will hinder his activity/involvement at his  residence which may worsen isolation/anxiety/depression. I encouraged him to consider doing this at a later time if he can -Will obtain bone survey to complete work up -We feel this is more likely MGUS, less likely multiple myeloma and we are not recommending bone marrow biopsy at this time -We briefly discussed the nature of MGUS and low chance of transformation to MM, and monitoring -Lab today (SPEP with IFE and QIG, light chains) and bone survey in 1-2 weeks -Phone visit 4/29 with pt and his daughter to review results  -Pt seen with Dr. Mosetta Putt  Chronic conditions: OA, RA, CHF, HTN, HL, CVA, CKD, vit D Deficiency -Reviewed med list and historical notes -Per PCP, nephrology, and rheumatology  -Pt's daughter is very involved, his POA  Mental health -? Cognitive changes, per daughter -Per psych  -Controlled   PLAN: -Medical record reviewed -Lab today (SPEP with IFE and QIG, light chains; pt declined 24 hr urine) -Bone survey in 1-2 weeks -Phone follow up 4/29 with pt and his daughter (will call both #'s) -Pt seen with Dr. Mosetta Putt    Orders Placed This Encounter  Procedures   DG Bone Survey Met    Standing Status:   Future    Standing Expiration Date:   04/27/2023    Order Specific Question:   Reason for Exam (SYMPTOM  OR DIAGNOSIS REQUIRED)  Answer:   elevated M spike, r/o MGUS/MM    Order Specific Question:   Preferred imaging location?    Answer:   [facility]   Kappa/lambda light chains    Standing Status:   Future    Number of Occurrences:   1    Standing Expiration Date:   04/27/2023   Multiple Myeloma Panel (SPEP&IFE w/QIG)    Standing Status:   Future    Number of Occurrences:   1    Standing Expiration Date:   04/27/2023     All questions were answered. The patient knows to call the clinic with any problems, questions or concerns.      Pollyann Samples, NP 04/27/22   Addendum I have seen the patient, examined him. I agree with the assessment and and plan  and have edited the notes.   72 yo male with PMH of OA, RA, CHF, HTN, HL, CVA, CKD, vit D Deficiency, was referred by his nephrologist for abnormal M protein.  There is no clinical or lab of concern for multiple myeloma.  His M protein level is not high (<1.5), will check immunofixation and quantitative immunoglobulin.  His kappa light chain is mildly elevated, light chain ratio is also slightly high.  This is overall probably low risk MGUS, I recommend lab today and f/u with lab in 6-12 months then yearly if stable.  Will obtain MRI bone survey, will hold on bone marrow biopsy.  All questions were answered.  Patient declined a 24-hour urine test.  Malachy Mood MD  04/27/2022

## 2022-04-28 LAB — KAPPA/LAMBDA LIGHT CHAINS
Kappa free light chain: 29.8 mg/L — ABNORMAL HIGH (ref 3.3–19.4)
Kappa, lambda light chain ratio: 1.62 (ref 0.26–1.65)
Lambda free light chains: 18.4 mg/L (ref 5.7–26.3)

## 2022-04-29 LAB — MULTIPLE MYELOMA PANEL, SERUM
Albumin SerPl Elph-Mcnc: 4 g/dL (ref 2.9–4.4)
Albumin/Glob SerPl: 1.2 (ref 0.7–1.7)
Alpha 1: 0.3 g/dL (ref 0.0–0.4)
Alpha2 Glob SerPl Elph-Mcnc: 0.6 g/dL (ref 0.4–1.0)
B-Globulin SerPl Elph-Mcnc: 0.9 g/dL (ref 0.7–1.3)
Gamma Glob SerPl Elph-Mcnc: 1.5 g/dL (ref 0.4–1.8)
Globulin, Total: 3.4 g/dL (ref 2.2–3.9)
IgA: 118 mg/dL (ref 61–437)
IgG (Immunoglobin G), Serum: 1502 mg/dL (ref 603–1613)
IgM (Immunoglobulin M), Srm: 119 mg/dL (ref 20–172)
M Protein SerPl Elph-Mcnc: 0.7 g/dL — ABNORMAL HIGH
Total Protein ELP: 7.4 g/dL (ref 6.0–8.5)

## 2022-05-04 DIAGNOSIS — M62552 Muscle wasting and atrophy, not elsewhere classified, left thigh: Secondary | ICD-10-CM | POA: Diagnosis not present

## 2022-05-04 DIAGNOSIS — M62562 Muscle wasting and atrophy, not elsewhere classified, left lower leg: Secondary | ICD-10-CM | POA: Diagnosis not present

## 2022-05-04 DIAGNOSIS — Z471 Aftercare following joint replacement surgery: Secondary | ICD-10-CM | POA: Diagnosis not present

## 2022-05-04 DIAGNOSIS — M62561 Muscle wasting and atrophy, not elsewhere classified, right lower leg: Secondary | ICD-10-CM | POA: Diagnosis not present

## 2022-05-04 DIAGNOSIS — M62551 Muscle wasting and atrophy, not elsewhere classified, right thigh: Secondary | ICD-10-CM | POA: Diagnosis not present

## 2022-05-04 DIAGNOSIS — R262 Difficulty in walking, not elsewhere classified: Secondary | ICD-10-CM | POA: Diagnosis not present

## 2022-05-05 ENCOUNTER — Ambulatory Visit (HOSPITAL_COMMUNITY)
Admission: RE | Admit: 2022-05-05 | Discharge: 2022-05-05 | Disposition: A | Discharge status: Home / Self Care | Payer: Medicare Other | Source: Ambulatory Visit | Attending: Nurse Practitioner | Admitting: Nurse Practitioner

## 2022-05-05 ENCOUNTER — Telehealth: Payer: Self-pay | Admitting: *Deleted

## 2022-05-05 DIAGNOSIS — M19011 Primary osteoarthritis, right shoulder: Secondary | ICD-10-CM | POA: Diagnosis not present

## 2022-05-05 DIAGNOSIS — R778 Other specified abnormalities of plasma proteins: Secondary | ICD-10-CM | POA: Diagnosis not present

## 2022-05-05 NOTE — Telephone Encounter (Signed)
   Pre-operative Risk Assessment    Patient [name]  DOB: 20-Nov-1950 MRN: [id number]     Request for Surgical Clearance    Procedure:  Dental Extraction - Amount of Teeth to be Pulled:  ONE (1) SINGLE TOOTH FOR EXTRACTION  WITH SOCKET PRESERVATION OF THREE (3) DENTAL IMPLANTS  Date of Surgery:  Clearance TBD                                 Surgeon:  NOT LISTED Surgeon's Group or Practice Name:  [facility] Phone number:  [phone] Fax number:  [phone]   Type of Clearance Requested:   - Medical  - Pharmacy:  Hold Clopidogrel (Plavix) ; (DOES PT NEEDS SBE )   Type of Anesthesia:  Local    Additional requests/questions:    Elpidio Anis   05/05/2022, 4:44 PM

## 2022-05-06 DIAGNOSIS — Z471 Aftercare following joint replacement surgery: Secondary | ICD-10-CM | POA: Diagnosis not present

## 2022-05-06 DIAGNOSIS — R262 Difficulty in walking, not elsewhere classified: Secondary | ICD-10-CM | POA: Diagnosis not present

## 2022-05-06 DIAGNOSIS — M62551 Muscle wasting and atrophy, not elsewhere classified, right thigh: Secondary | ICD-10-CM | POA: Diagnosis not present

## 2022-05-06 DIAGNOSIS — M62552 Muscle wasting and atrophy, not elsewhere classified, left thigh: Secondary | ICD-10-CM | POA: Diagnosis not present

## 2022-05-06 DIAGNOSIS — M62561 Muscle wasting and atrophy, not elsewhere classified, right lower leg: Secondary | ICD-10-CM | POA: Diagnosis not present

## 2022-05-06 DIAGNOSIS — M62562 Muscle wasting and atrophy, not elsewhere classified, left lower leg: Secondary | ICD-10-CM | POA: Diagnosis not present

## 2022-05-06 NOTE — Telephone Encounter (Addendum)
   Primary Cardiologist: Christell Constant, MD  Chart reviewed as part of pre-operative protocol coverage. Given past medical history and time since last visit, based on ACC/AHA guidelines, [name] would be at acceptable risk for the planned procedure without further cardiovascular testing.   Patient was advised that if he develops new symptoms prior to surgery to contact our office to arrange a follow-up appointment.  He verbalized understanding.  SBE prophylaxis is not needed for this patient from a cardiac perspective.  Plavix is not prescribed for cardiac indication.  I will route this recommendation to the requesting party via Epic fax function and remove from pre-op pool.  Please call with questions.  Christopher Aland, NP-C  05/06/2022, 8:28 AM [address], [address] Office [phone] Fax [phone]

## 2022-05-11 DIAGNOSIS — R262 Difficulty in walking, not elsewhere classified: Secondary | ICD-10-CM | POA: Diagnosis not present

## 2022-05-11 DIAGNOSIS — M62562 Muscle wasting and atrophy, not elsewhere classified, left lower leg: Secondary | ICD-10-CM | POA: Diagnosis not present

## 2022-05-11 DIAGNOSIS — M62552 Muscle wasting and atrophy, not elsewhere classified, left thigh: Secondary | ICD-10-CM | POA: Diagnosis not present

## 2022-05-11 DIAGNOSIS — M62561 Muscle wasting and atrophy, not elsewhere classified, right lower leg: Secondary | ICD-10-CM | POA: Diagnosis not present

## 2022-05-11 DIAGNOSIS — Z471 Aftercare following joint replacement surgery: Secondary | ICD-10-CM | POA: Diagnosis not present

## 2022-05-11 DIAGNOSIS — M62551 Muscle wasting and atrophy, not elsewhere classified, right thigh: Secondary | ICD-10-CM | POA: Diagnosis not present

## 2022-05-13 DIAGNOSIS — R262 Difficulty in walking, not elsewhere classified: Secondary | ICD-10-CM | POA: Diagnosis not present

## 2022-05-13 DIAGNOSIS — M62552 Muscle wasting and atrophy, not elsewhere classified, left thigh: Secondary | ICD-10-CM | POA: Diagnosis not present

## 2022-05-13 DIAGNOSIS — M62561 Muscle wasting and atrophy, not elsewhere classified, right lower leg: Secondary | ICD-10-CM | POA: Diagnosis not present

## 2022-05-13 DIAGNOSIS — Z471 Aftercare following joint replacement surgery: Secondary | ICD-10-CM | POA: Diagnosis not present

## 2022-05-13 DIAGNOSIS — M62551 Muscle wasting and atrophy, not elsewhere classified, right thigh: Secondary | ICD-10-CM | POA: Diagnosis not present

## 2022-05-13 DIAGNOSIS — M62562 Muscle wasting and atrophy, not elsewhere classified, left lower leg: Secondary | ICD-10-CM | POA: Diagnosis not present

## 2022-05-16 ENCOUNTER — Inpatient Hospital Stay (HOSPITAL_BASED_OUTPATIENT_CLINIC_OR_DEPARTMENT_OTHER): Payer: Medicare Other | Admitting: Nurse Practitioner

## 2022-05-16 ENCOUNTER — Encounter: Payer: Self-pay | Admitting: Nurse Practitioner

## 2022-05-16 DIAGNOSIS — D472 Monoclonal gammopathy: Secondary | ICD-10-CM | POA: Diagnosis not present

## 2022-05-16 NOTE — Progress Notes (Signed)
Patient Care Team: Knox Royalty, MD as PCP - General (Family Medicine) Christell Constant, MD as PCP - Cardiology (Cardiology) Marcellina Millin, MD (Psychiatry)    I connected with Gerrie Nordmann on 05/16/22 at 10:00 AM EDT by telephone visit and verified that I am speaking with the correct person using two identifiers.   I discussed the limitations, risks, security and privacy concerns of performing an evaluation and management service by telemedicine and the availability of in-person appointments. I also discussed with the patient that there may be a patient responsible charge related to this service. The patient expressed understanding and agreed to proceed.   Other persons participating in the visit and their role in the encounter: Felicity Pellegrini, daughter and POA, on conference call   Patient's location: Facility/residence Provider's location: [facility] office    CHIEF COMPLAINT: Follow up elevated M protein   CURRENT THERAPY: Observation   INTERVAL HISTORY [name] presents by phone, with his daughter on conference call to review blood work.  Last seen by me in initial consult 04/27/2022.  He denies any changes in the interim.  Continues to recover from knee surgery.    ROS  All other systems reviewed and negative  Past Medical History:  Diagnosis Date   (HFpEF) heart failure with preserved ejection fraction (HCC)    Allergy    Anxiety    Arthritis    Chronic kidney disease    Depression    Frequent urination    GERD (gastroesophageal reflux disease)    Headache    mirgaines in the past   Herpes    History of nuclear stress test    Myoview 05/2021: EF 77, normal perfusion, low risk   Hyperlipidemia    Hypertension    Memory loss    Obesity    Stroke [facility])    Urgency of urination      Past Surgical History:  Procedure Laterality Date   CHOLECYSTECTOMY N/A 12/17/2019   Procedure: LAPAROSCOPIC CHOLECYSTECTOMY WITH ICG INJECTION;  Surgeon: Emelia Loron, MD;  Location: [facility] OR;  Service: General;  Laterality: N/A;   disckec     shouler arthroscopy Left    SPINAL FUSION     TONSILLECTOMY     TOTAL KNEE ARTHROPLASTY Right 11/15/2021   Procedure: TOTAL KNEE ARTHROPLASTY;  Surgeon: Ollen Gross, MD;  Location: [facility] ORS;  Service: Orthopedics;  Laterality: Right;   VASECTOMY     no per pt   WISDOM TOOTH EXTRACTION       Outpatient Encounter Medications as of 05/16/2022  Medication Sig   acetaminophen (TYLENOL) 500 MG tablet Take 1-2 tablets (500-1,000 mg total) by mouth every 6 (six) hours as needed for moderate pain.   amLODipine (NORVASC) 5 MG tablet Take 1 tablet (5 mg total) by mouth daily.   busPIRone (BUSPAR) 30 MG tablet Take 30 mg by mouth 2 (two) times daily.   candesartan (ATACAND) 32 MG tablet Take 32 mg by mouth daily.   cetirizine (ZYRTEC ALLERGY) 10 MG tablet Take 1 tablet (10 mg total) by mouth daily.   clopidogrel (PLAVIX) 75 MG tablet Take 75 mg by mouth daily.   fluticasone (FLONASE) 50 MCG/ACT nasal spray Place 2 sprays into both nostrils daily.   folic acid (FOLVITE) 1 MG tablet Take 1 mg by mouth daily.   fondaparinux (ARIXTRA) 2.5 MG/0.5ML SOLN injection Inject 0.5 mLs (2.5 mg total) into the skin daily before breakfast for 18 days. Then resume Plavix once a day.  furosemide (LASIX) 40 MG tablet Take 40 mg by mouth daily.   hydrALAZINE (APRESOLINE) 50 MG tablet Take 1 tablet (50 mg total) by mouth 2 (two) times daily.   hydroxychloroquine (PLAQUENIL) 200 MG tablet Take 400 mg by mouth at bedtime.   ipratropium (ATROVENT) 0.03 % nasal spray Place 2 sprays into both nostrils 2 (two) times daily.   metoprolol succinate (TOPROL XL) 25 MG 24 hr tablet Take 1 tablet (25 mg total) by mouth daily.   PARoxetine (PAXIL-CR) 37.5 MG 24 hr tablet Take 1 tablet (37.5 mg total) by mouth at bedtime.   primidone (MYSOLINE) 250 MG tablet TAKE 1 TABLET BY MOUTH TWICE A DAY   QUEtiapine (SEROQUEL) 25 MG tablet TAKE 3 TABLETS BY  MOUTH AT BEDTIME (Patient taking differently: Take 75 mg by mouth at bedtime.)   rosuvastatin (CRESTOR) 40 MG tablet Take 40 mg by mouth daily.   sildenafil (VIAGRA) 50 MG tablet Take 100 mg by mouth as needed for erectile dysfunction.   spironolactone (ALDACTONE) 25 MG tablet Take 25 mg by mouth daily.   Suvorexant (BELSOMRA) 10 MG TABS Take 1 tablet by mouth at bedtime.   traZODone (DESYREL) 150 MG tablet Take 1 tablet (150 mg total) by mouth at bedtime as needed. for sleep   valACYclovir (VALTREX) 1000 MG tablet Take 1,000 mg by mouth daily.   Vitamin D, Ergocalciferol, (DRISDOL) 1.25 MG (50000 UNIT) CAPS capsule Take 50,000 Units by mouth every Saturday.   No facility-administered encounter medications on file as of 05/16/2022.     There were no vitals filed for this visit. There is no height or weight on file to calculate BMI.   PHYSICAL EXAM Patient appears well over the phone.  Voice is strong, speech is clear.  Mood/affect appear normal for situation.  No cough or conversational dyspnea.  CBC    Component Value Date/Time   WBC 10.7 (H) 11/17/2021 0334   RBC 2.99 (L) 11/17/2021 0334   HGB 10.3 (L) 11/17/2021 0334   HCT 30.2 (L) 11/17/2021 0334   PLT 275 11/17/2021 0334   MCV 101.0 (H) 11/17/2021 0334   MCH 34.4 (H) 11/17/2021 0334   MCHC 34.1 11/17/2021 0334   RDW 12.2 11/17/2021 0334   LYMPHSABS 1.6 12/16/2019 1307   MONOABS 0.5 12/16/2019 1307   EOSABS 0.1 12/16/2019 1307   BASOSABS 0.0 12/16/2019 1307     CMP     Component Value Date/Time   NA 135 11/16/2021 0335   NA 140 04/09/2021 1401   K 3.7 11/16/2021 0335   CL 106 11/16/2021 0335   CO2 24 11/16/2021 0335   GLUCOSE 112 (H) 11/16/2021 0335   BUN 12 11/16/2021 0335   BUN 9 04/09/2021 1401   CREATININE 1.21 11/16/2021 0335   CALCIUM 7.7 (L) 11/16/2021 0335   PROT 6.2 (L) 12/18/2019 0343   PROT 7.3 05/29/2014 1503   ALBUMIN 3.4 (L) 12/18/2019 0343   ALBUMIN 4.7 05/29/2014 1503   AST 40 12/18/2019 0343    ALT 73 (H) 12/18/2019 0343   ALKPHOS 57 12/18/2019 0343   BILITOT 0.6 12/18/2019 0343   BILITOT 0.3 05/29/2014 1503   GFRNONAA >60 11/16/2021 0335   GFRAA 84 02/12/2020 1022     ASSESSMENT & PLAN: 72 year old male   IgG MGUS, low risk -He had abnormal SPEP with elevated M spike 0.9 and kappa/lambda light chain ratio 1.7 in 03/2022, referred here by nephrologist  -Other labs show Scr 1.5, otherwise normal CMP and mild anemia hgb 12.4 (  improving since ortho surgery in 10/2021). No evidence of end organ involvement -Seen by [place] 04/27/22, work up showed IgG specific MGUS, low risk, and negative bone survey. He declined 24 urine for UPEP/light chains, it will hinder his activity/involvement at his residence which may worsen isolation/anxiety/depression. I encouraged him to consider doing this at a later time if he can -I reviewed the above work up and nature of MGUS with pt and his daughter today on the phone. This is a benign condition, but with risk of evolving to multiple myeloma at ~1% per year.  -We recommend observation, with SPEP w IFE and light chain labs q6-12 months and annual f/up. -His daughter would like PCP or nephrology to follow, so he does not need as many specialist appointments. Will ask if they are comfortable to follow -if PCP/nephrology agree, we will see him back as needed in the future, if he has significant M spike increase, CRAB criteria, or new cytopenias.  -Pt and daughter agree with the plan and appreciate the call.    Chronic conditions: OA, RA, CHF, HTN, HL, CVA, CKD, vit D Deficiency -Reviewed med list and historical notes -Per PCP, nephrology, and rheumatology  -Pt's daughter is very involved, his POA   Mental health -? Cognitive changes, per daughter -Per psych  -Controlled   PLAN: -Work up reviewed, IgG MGUS -Observation, with Multiple myelmoa panel (SPEP& IFE w/ QIG) and light chains q6-12 months -Will ask PCP or nephrology to follow this, per pt's  daughter request -If they agree, f/up open - we can see him back if he has significant M spike increase, CRAB criteria, or new cytopenias.    All questions were answered. The patient knows to call the clinic with any problems, questions or concerns. No barriers to learning were detected. I spent 6 minutes counseling the patient non-face to face. The total time spent in the appointment was 10 minutes and more than 50% was on counseling, review of test results, and coordination of care.    Santiago Glad, NP-C 05/16/2022

## 2022-05-17 ENCOUNTER — Telehealth: Payer: Self-pay

## 2022-05-17 ENCOUNTER — Other Ambulatory Visit: Payer: Self-pay

## 2022-05-17 NOTE — Telephone Encounter (Signed)
Faxed the last office note with Santiago Glad NP to Dr. Yetta Barre and Dr. Signe Colt for the transfer of treatment for this patients MGUS as per the family. Wanted to try to keep from having so many specialist visits. So office note was faxed to PCP and Nephrologist as per Santiago Glad NP.

## 2022-05-18 ENCOUNTER — Ambulatory Visit (INDEPENDENT_AMBULATORY_CARE_PROVIDER_SITE_OTHER): Payer: Medicare Other | Admitting: Neurology

## 2022-05-18 ENCOUNTER — Encounter: Payer: Self-pay | Admitting: Neurology

## 2022-05-18 VITALS — BP 134/70 | HR 58 | Ht 67.0 in | Wt 191.0 lb

## 2022-05-18 DIAGNOSIS — G3184 Mild cognitive impairment, so stated: Secondary | ICD-10-CM

## 2022-05-18 DIAGNOSIS — Z8673 Personal history of transient ischemic attack (TIA), and cerebral infarction without residual deficits: Secondary | ICD-10-CM

## 2022-05-18 DIAGNOSIS — G250 Essential tremor: Secondary | ICD-10-CM | POA: Diagnosis not present

## 2022-05-18 NOTE — Progress Notes (Signed)
[facility] [address] [place]. [place] [address] [phone]       OFFICE CONSULT NOTE  [name] Date of Birth:  01-11-1951 Medical Record Number:  [id number]   Referring MD: [name]  Reason for Referral: Headaches  HPI: Initial visit 06/04/2020 [name] is a 72 year old African-American male seen today for consultation visit for headaches.  History is obtained from the patient and review of referral notes and I personally reviewed electronic medical records and pertinent imaging films in PACS.  He has past medical history of hypertension, hyperlipidemia, obesity, right brain subcortical infarct in 2011.  He states over 4 months ago he developed new onset of headaches which were mostly nocturnal.  He would wake up from sleep with a severe headache which he describes as bitemporal and spreading into the retro-orbital regions severe 10/10 in intensity with burning and at times throbbing quality.  There was accompanying nausea and he occasionally vomited.  He denied any light or sound sensitivity.  Headache was not relieved by sleeping as he could not sleep.  It would last for hours.  The headache initially occurred on a daily basis for several weeks.  He saw his primary physician who gave him Tylenol with codeine which did not work.  Finally he saw Dr. Knox Leon who prescribed gabapentin on 04/03/2020 and he has been taking it 1 tablet 3 times daily and the headaches seem to have gone within a few days of starting it.  He however seems to have gained weight particularly on his belly and wants to lose it.  He denied any accompanying symptoms in the form of blurred vision, loss of vision, jaw claudication, myalgias or scalp tenderness.  He does have remote history of occasional migraines in his 67s when he had accompanying light and sound sensitivity as well.  He felt the current headaches were different.  The patient has recently gained weight he does snore but  he has never been tested for sleep apnea.  Patient has lifelong history of mild action tremor tremor involving mostly left upper extremity felt to be related to anxiety which appears stable.  He history of right brain subcortical infarct due to small vessel disease in December 2011 with only minor residual deficits on the left.  He states he has been on Plavix ever since and not missed doses no recurrent stroke or TIA symptoms for more than 10 years.  He states his blood pressure and cholesterol has been under good control but he cannot tell me when the last time it was checked.  He was last seen in our office for follow-up by [name] nurse practitioner on 01/09/2020. Update 05/17/2021 ; he returns for follow-up after his initial visit with me nearly a year ago.  His daughter also participated in this visit via telephone.  Patient plans to undergo elective right knee surgery by [name] next Monday and is here for neurological clearance.  Patient states he is doing well from neurological standpoint.  He has no recurrent TIA stroke symptoms since 2011.  He remains on Plavix which is tolerating well without bruising or bleeding.  He states his blood pressure is under good control.  He remains on Crestor which is tolerating well without muscle aches and pains.  His last lab work on 06/04/2020 had shown LDL cholesterol of 85 mg percent and hemoglobin A1c 5.9.  ESR was 2 mm.  I had also ordered an MRI scan of the brain at that  time because you complained of new onset headaches and MRI had shown only changes of nonspecific white matter disease.  No acute abnormality.  Patient states his headaches have gone cannot remember the last time he had a headache.  He remains on gabapentin 400 twice daily which had caused some weight gain and I suggested he taper and stop it at last visit but he has not done that.  I also referred him for evaluation for sleep apnea and he did see [name] who recommended home sleep study but  for unclear reason that has not been done either.  He also has a essential tremor for which she takes primidone 250 twice daily and is tolerating well without significant side effects and states tremor is well controlled and not functionally disabling.  He is living in independent living but does complain of mild short-term memory difficulties.  His daughter does help arrange his medications for him.  He has been driving.  He uses GPS.  He is still independent in most activities of daily living.  He has no other complaints. Update 05/18/2022 : He returns for follow-up after last visit a year ago.  He is accompanied by his daughter.  He states he is doing well.  Has not had any headaches.  He has not had recurrent stroke or TIA symptoms for more than 10 years.  He remains on Plavix which is tolerating well with only minor bruising and no bleeding.  He states his blood pressure is under good control.  He is tolerating Crestor well without muscle aches and pains.  Continues to have mild tremor in his hands but is not functionally disabling.  He is tolerating Mysoline 250 mg twice daily quite well without any side effects.  Continues to have mild short-term memory and cognitive difficulties.  He is now living in independent living.  His medications are laid out by his daughter the week.  He does not do significant daily standing activities does not go out a lot.  He had right knee replacement surgery done in October and has recovered well from that.  He had a MoCA done on 12/07/2021 and scored 23/30 at [facility].  ROS:   14 system review of systems is positive for tremor, memory loss  , snoring and all other systems negative  PMH:  Past Medical History:  Diagnosis Date   (HFpEF) heart failure with preserved ejection fraction (HCC)    Allergy    Anxiety    Arthritis    Chronic kidney disease    Depression    Frequent urination    GERD (gastroesophageal reflux disease)    Headache     mirgaines in the past   Herpes    History of nuclear stress test    Myoview 05/2021: EF 77, normal perfusion, low risk   Hyperlipidemia    Hypertension    Memory loss    Obesity    Stroke (HCC)    Urgency of urination     Social History:  Social History   Socioeconomic History   Marital status: Divorced    Spouse name: Not on file   Number of children: 1   Years of education: Masters   Highest education level: Not on file  Occupational History   Occupation: retired    Associate Professor: RETIRED  Tobacco Use   Smoking status: Former   Smokeless tobacco: Never  Building services engineer Use: Never used  Substance and Sexual Activity  Alcohol use: No    Alcohol/week: 1.0 standard drink of alcohol    Types: 1 Cans of beer per week   Drug use: No   Sexual activity: Never  Other Topics Concern   Not on file  Social History Narrative   Patient lives in Independent living @ [facility]   Patient is right-handed.   Patient does not drink any caffeine.   Social Determinants of Health   Financial Resource Strain: Not on file  Food Insecurity: No Food Insecurity (11/15/2021)   Hunger Vital Sign    Worried About Running Out of Food in the Last Year: Never true    Ran Out of Food in the Last Year: Never true  Transportation Needs: No Transportation Needs (11/15/2021)   PRAPARE - Administrator, Civil Service (Medical): No    Lack of Transportation (Non-Medical): No  Physical Activity: Not on file  Stress: Not on file  Social Connections: Not on file  Intimate Partner Violence: Not At Risk (11/15/2021)   Humiliation, Afraid, Rape, and Kick questionnaire    Fear of Current or Ex-Partner: No    Emotionally Abused: No    Physically Abused: No    Sexually Abused: No    Medications:   Current Outpatient Medications on File Prior to Visit  Medication Sig Dispense Refill   acetaminophen (TYLENOL) 500 MG tablet Take 1-2 tablets (500-1,000 mg total) by mouth every 6 (six)  hours as needed for moderate pain. 30 tablet 0   amLODipine (NORVASC) 5 MG tablet Take 1 tablet (5 mg total) by mouth daily. 90 tablet 3   busPIRone (BUSPAR) 30 MG tablet Take 30 mg by mouth 2 (two) times daily.     candesartan (ATACAND) 32 MG tablet Take 32 mg by mouth daily.     cetirizine (ZYRTEC ALLERGY) 10 MG tablet Take 1 tablet (10 mg total) by mouth daily. (Patient taking differently: Take 10 mg by mouth at bedtime.) 30 tablet 0   clopidogrel (PLAVIX) 75 MG tablet Take 75 mg by mouth daily.     fluticasone (FLONASE) 50 MCG/ACT nasal spray Place 2 sprays into both nostrils daily. 16 g 12   folic acid (FOLVITE) 1 MG tablet Take 1 mg by mouth daily.     furosemide (LASIX) 40 MG tablet Take 40 mg by mouth in the morning.     hydrALAZINE (APRESOLINE) 50 MG tablet Take 1 tablet (50 mg total) by mouth 2 (two) times daily. 270 tablet 3   hydroxychloroquine (PLAQUENIL) 200 MG tablet Take 400 mg by mouth at bedtime.     metoprolol succinate (TOPROL XL) 25 MG 24 hr tablet Take 1 tablet (25 mg total) by mouth daily. 90 tablet 3   PARoxetine (PAXIL-CR) 37.5 MG 24 hr tablet Take 1 tablet (37.5 mg total) by mouth at bedtime. (Patient taking differently: Take 37.5 mg by mouth daily.) 30 tablet 3   primidone (MYSOLINE) 250 MG tablet TAKE 1 TABLET BY MOUTH TWICE A DAY 56 tablet 3   rosuvastatin (CRESTOR) 40 MG tablet Take 40 mg by mouth daily.     sildenafil (VIAGRA) 50 MG tablet Take 100 mg by mouth as needed for erectile dysfunction.     spironolactone (ALDACTONE) 25 MG tablet Take 25 mg by mouth daily.     Suvorexant (BELSOMRA) 10 MG TABS Take 1 tablet by mouth at bedtime. 30 tablet 2   traZODone (DESYREL) 150 MG tablet Take 1 tablet (150 mg total) by mouth at bedtime as needed.  for sleep (Patient taking differently: Take 150 mg by mouth as needed. for sleep) 30 tablet 3   valACYclovir (VALTREX) 1000 MG tablet Take 1,000 mg by mouth daily.     Vitamin D, Ergocalciferol, (DRISDOL) 1.25 MG (50000 UNIT)  CAPS capsule Take 50,000 Units by mouth every Saturday.     No current facility-administered medications on file prior to visit.    Allergies:   Allergies  Allergen Reactions   Amitriptyline Other (See Comments)   Aspirin Other (See Comments)    Dizziness, nausea and vomiting Balance issues   Lisinopril Other (See Comments)    Critical Cough   Lithium Diarrhea    Critical Per pt reaction unknown   Morphine Other (See Comments)   Nsaids Other (See Comments)   Oxycodone Nausea And Vomiting   Oxycodone Hcl Other (See Comments)   Oxycodone-Acetaminophen Nausea And Vomiting and Other (See Comments)    Also causes dizziness   Pork Allergy Other (See Comments)   Pork-Derived Products Other (See Comments)    Unspecified reaction    Physical Exam General: Obese middle-aged African-American male, seated, in no evident distress Head: head normocephalic and atraumatic.   Neck: supple with no carotid or supraclavicular bruits Cardiovascular: regular rate and rhythm, no murmurs Musculoskeletal: no deformity Skin:  no rash/petichiae Vascular:  Normal pulses all extremities  Neurologic Exam Mental Status: Awake and fully alert. Oriented to place and time. Recent and remote memory intact. Attention span, concentration and fund of knowledge appropriate. Mood and affect appropriate.  Diminished recall 2/3.  Able to name 10 animals which can walk on 4 legs.  Clock drawing 4/4. Cranial Nerves: Fundoscopic exam not done. Pupils equal, briskly reactive to light. Extraocular movements full without nystagmus. Visual fields full to confrontation. Hearing intact. Facial sensation intact. Face, tongue, palate moves normally and symmetrically.  Motor: Normal bulk and tone. Normal strength in all tested extremity muscles.  Mild left grip weakness.  Diminished fine finger movements on the left.  Orbits right over left upper extremity. Sensory.: intact to touch , pinprick , position and vibratory  sensation.  Coordination: Rapid alternating movements normal in all extremities. Finger-to-nose and heel-to-shin performed accurately bilaterally. Gait and Station: Arises from chair without difficulty. Stance is normal. Gait demonstrates normal stride length and balance . Able to heel, toe and tandem walk with mild difficulty.  Reflexes: 1+ and symmetric. Toes downgoing.        05/18/2022    3:08 PM 01/09/2020    7:28 AM 03/25/2019   11:18 AM 09/12/2018   10:12 AM 03/12/2018   10:17 AM 09/26/2017    8:36 AM 06/14/2017   10:09 AM  MMSE - Mini Mental State Exam  Orientation to time 5 4 3 3 4 3 3   Orientation to Place 5 5 4 4 5 5 5   Registration 3 3 3 3 3 3 3   Attention/ Calculation 5 3 2 2 5 5 5   Recall 0 3 3 3 2 3 2   Language- name 2 objects 2 2 2 2 2 2 2   Language- repeat 1 1 1 1 1 1 1   Language- follow 3 step command 3 3 3 3 3 3 3   Language- read & follow direction 1 1 1 1 1 1 1   Write a sentence 1 1 1 1 1 1 1   Copy design 1 1 1 1 1 1 1   Copy design-comments    named 3 animals     Total score 27 27 24  24  28 28 27       ASSESSMENT: 72 year old African-American male with new onset daily headaches in 2022 of unclear etiology which seems to have responded to gabapentin  and have resolved...  Remote history of right brain subcortical infarct in 2011 with multiple vascular risk factors of obesity, hypertension hyperlipidemia and suspected sleep apnea Longstanding history of benign essential tremor which appears stable.  Subjective memory difficulties daily due to likely mild age-related cognitive impairment.    PLAN: I had a long discussion with the patient and  his daughter regarding his remote stroke, mild cognitive impairment, essential tremors all of which appears quite stable.  He is doing well with neurovascular standpoint without recurrent stroke or TIA symptoms.  Continue Plavix for stroke prevention with aggressive risk factor modification strict control of hypertension blood  pressure goal below 130/90 and lipids with LDL cholesterol goal below 70 mg percent.  Continue Mysoline 250 mg twice daily for his essential tremor which appears to be quite well controlled and is not functionally disabling.  He also has mild memory difficulties due to age-related mild cognitive impairment.  I recommend increase participation in cognitively challenging activities like solving crossword puzzles, playing bridge and sudoku.  We also discussed memory compensation strategies.  He will return for follow-up in the future in a year or call earlier if necessary. Greater than 50% time during this 40-minute visit were spent in counseling and coordination of care about his new onset headaches as well as remote history of stroke and discussion about stroke prevention and answering questions Delia Heady, MD Note: This document was prepared with digital dictation and possible smart phrase technology. Any transcriptional errors that result from this process are unintentional.

## 2022-05-18 NOTE — Patient Instructions (Signed)
I had a long discussion with the patient and  his daughter regarding his remote stroke, mild cognitive impairment, essential tremors all of which appears quite stable.  He is doing well with neurovascular standpoint without recurrent stroke or TIA symptoms.  Continue Plavix for stroke prevention with aggressive risk factor modification strict control of hypertension blood pressure goal below 130/90 and lipids with LDL cholesterol goal below 70 mg percent.  Continue Mysoline 250 mg twice daily for his essential tremor which appears to be quite well controlled and is not functionally disabling.  He also has mild memory difficulties due to age-related mild cognitive impairment.  I recommend increase participation in cognitively challenging activities like solving crossword puzzles, playing bridge and sudoku.  We also discussed memory compensation strategies.  He will return for follow-up in the future in a year or call earlier if necessary.  Memory Compensation Strategies  Use "WARM" strategy.  W= write it down  A= associate it  R= repeat it  M= make a mental note  2.   You can keep a Glass blower/designer.  Use a 3-ring notebook with sections for the following: calendar, important names and phone numbers,  medications, doctors' names/phone numbers, lists/reminders, and a section to journal what you did  each day.   3.    Use a calendar to write appointments down.  4.    Write yourself a schedule for the day.  This can be placed on the calendar or in a separate section of the Memory Notebook.  Keeping a  regular schedule can help memory.  5.    Use medication organizer with sections for each day or morning/evening pills.  You may need help loading it  6.    Keep a basket, or pegboard by the door.  Place items that you need to take out with you in the basket or on the pegboard.  You may also want to  include a message board for reminders.  7.    Use sticky notes.  Place sticky notes with reminders in  a place where the task is performed.  For example: " turn off the  stove" placed by the stove, "lock the door" placed on the door at eye level, " take your medications" on  the bathroom mirror or by the place where you normally take your medications.  8.    Use alarms/timers.  Use while cooking to remind yourself to check on food or as a reminder to take your medicine, or as a  reminder to make a call, or as a reminder to perform another task, etc.

## 2022-05-19 ENCOUNTER — Encounter: Payer: Self-pay | Admitting: Neurology

## 2022-05-19 ENCOUNTER — Other Ambulatory Visit: Payer: Self-pay | Admitting: Neurology

## 2022-05-19 ENCOUNTER — Encounter: Payer: Self-pay | Admitting: Nurse Practitioner

## 2022-05-19 DIAGNOSIS — N529 Male erectile dysfunction, unspecified: Secondary | ICD-10-CM | POA: Diagnosis not present

## 2022-05-19 DIAGNOSIS — M62562 Muscle wasting and atrophy, not elsewhere classified, left lower leg: Secondary | ICD-10-CM | POA: Diagnosis not present

## 2022-05-19 DIAGNOSIS — M79641 Pain in right hand: Secondary | ICD-10-CM | POA: Diagnosis not present

## 2022-05-19 DIAGNOSIS — I10 Essential (primary) hypertension: Secondary | ICD-10-CM | POA: Diagnosis not present

## 2022-05-19 DIAGNOSIS — E7800 Pure hypercholesterolemia, unspecified: Secondary | ICD-10-CM | POA: Diagnosis not present

## 2022-05-19 DIAGNOSIS — M62561 Muscle wasting and atrophy, not elsewhere classified, right lower leg: Secondary | ICD-10-CM | POA: Diagnosis not present

## 2022-05-19 DIAGNOSIS — M79642 Pain in left hand: Secondary | ICD-10-CM | POA: Diagnosis not present

## 2022-05-19 DIAGNOSIS — R7303 Prediabetes: Secondary | ICD-10-CM | POA: Diagnosis not present

## 2022-05-19 DIAGNOSIS — Z471 Aftercare following joint replacement surgery: Secondary | ICD-10-CM | POA: Diagnosis not present

## 2022-05-19 DIAGNOSIS — N1832 Chronic kidney disease, stage 3b: Secondary | ICD-10-CM | POA: Diagnosis not present

## 2022-05-19 DIAGNOSIS — G250 Essential tremor: Secondary | ICD-10-CM

## 2022-05-19 DIAGNOSIS — H6121 Impacted cerumen, right ear: Secondary | ICD-10-CM | POA: Diagnosis not present

## 2022-05-19 DIAGNOSIS — A6002 Herpesviral infection of other male genital organs: Secondary | ICD-10-CM | POA: Diagnosis not present

## 2022-05-19 DIAGNOSIS — R262 Difficulty in walking, not elsewhere classified: Secondary | ICD-10-CM | POA: Diagnosis not present

## 2022-05-19 DIAGNOSIS — Z6832 Body mass index (BMI) 32.0-32.9, adult: Secondary | ICD-10-CM | POA: Diagnosis not present

## 2022-05-19 DIAGNOSIS — R1312 Dysphagia, oropharyngeal phase: Secondary | ICD-10-CM | POA: Diagnosis not present

## 2022-05-19 DIAGNOSIS — E559 Vitamin D deficiency, unspecified: Secondary | ICD-10-CM | POA: Diagnosis not present

## 2022-05-19 DIAGNOSIS — M62552 Muscle wasting and atrophy, not elsewhere classified, left thigh: Secondary | ICD-10-CM | POA: Diagnosis not present

## 2022-05-19 DIAGNOSIS — M62551 Muscle wasting and atrophy, not elsewhere classified, right thigh: Secondary | ICD-10-CM | POA: Diagnosis not present

## 2022-05-20 ENCOUNTER — Other Ambulatory Visit: Payer: Self-pay

## 2022-05-20 DIAGNOSIS — D472 Monoclonal gammopathy: Secondary | ICD-10-CM

## 2022-05-20 NOTE — Progress Notes (Signed)
Pollyann Samples, NP  Arcelia Jew, RN; Verlee Rossetti, CMA He'll need Multiple myeloma panel (SPEP with IFE and QIG) and light chains q6-12 months.  Thanks, Lacie NP       Previous Messages    Reference MyChart conversation regarding labs to be drawn at pt's PCP.    Lab orders faxed to the fax number provided by the pt's son in the MyChart message to Santiago Glad, NP.  Fax confirmation received.

## 2022-05-21 DIAGNOSIS — M62552 Muscle wasting and atrophy, not elsewhere classified, left thigh: Secondary | ICD-10-CM | POA: Diagnosis not present

## 2022-05-21 DIAGNOSIS — Z471 Aftercare following joint replacement surgery: Secondary | ICD-10-CM | POA: Diagnosis not present

## 2022-05-21 DIAGNOSIS — M62561 Muscle wasting and atrophy, not elsewhere classified, right lower leg: Secondary | ICD-10-CM | POA: Diagnosis not present

## 2022-05-21 DIAGNOSIS — R262 Difficulty in walking, not elsewhere classified: Secondary | ICD-10-CM | POA: Diagnosis not present

## 2022-05-21 DIAGNOSIS — M62562 Muscle wasting and atrophy, not elsewhere classified, left lower leg: Secondary | ICD-10-CM | POA: Diagnosis not present

## 2022-05-21 DIAGNOSIS — M62551 Muscle wasting and atrophy, not elsewhere classified, right thigh: Secondary | ICD-10-CM | POA: Diagnosis not present

## 2022-05-23 DIAGNOSIS — F331 Major depressive disorder, recurrent, moderate: Secondary | ICD-10-CM | POA: Diagnosis not present

## 2022-05-23 DIAGNOSIS — F411 Generalized anxiety disorder: Secondary | ICD-10-CM | POA: Diagnosis not present

## 2022-05-24 DIAGNOSIS — M62551 Muscle wasting and atrophy, not elsewhere classified, right thigh: Secondary | ICD-10-CM | POA: Diagnosis not present

## 2022-05-24 DIAGNOSIS — M62552 Muscle wasting and atrophy, not elsewhere classified, left thigh: Secondary | ICD-10-CM | POA: Diagnosis not present

## 2022-05-24 DIAGNOSIS — M62562 Muscle wasting and atrophy, not elsewhere classified, left lower leg: Secondary | ICD-10-CM | POA: Diagnosis not present

## 2022-05-24 DIAGNOSIS — R262 Difficulty in walking, not elsewhere classified: Secondary | ICD-10-CM | POA: Diagnosis not present

## 2022-05-24 DIAGNOSIS — Z471 Aftercare following joint replacement surgery: Secondary | ICD-10-CM | POA: Diagnosis not present

## 2022-05-24 DIAGNOSIS — M62561 Muscle wasting and atrophy, not elsewhere classified, right lower leg: Secondary | ICD-10-CM | POA: Diagnosis not present

## 2022-05-26 DIAGNOSIS — Z96651 Presence of right artificial knee joint: Secondary | ICD-10-CM | POA: Diagnosis not present

## 2022-05-27 DIAGNOSIS — M62552 Muscle wasting and atrophy, not elsewhere classified, left thigh: Secondary | ICD-10-CM | POA: Diagnosis not present

## 2022-05-27 DIAGNOSIS — Z471 Aftercare following joint replacement surgery: Secondary | ICD-10-CM | POA: Diagnosis not present

## 2022-05-27 DIAGNOSIS — M62561 Muscle wasting and atrophy, not elsewhere classified, right lower leg: Secondary | ICD-10-CM | POA: Diagnosis not present

## 2022-05-27 DIAGNOSIS — R262 Difficulty in walking, not elsewhere classified: Secondary | ICD-10-CM | POA: Diagnosis not present

## 2022-05-27 DIAGNOSIS — M62562 Muscle wasting and atrophy, not elsewhere classified, left lower leg: Secondary | ICD-10-CM | POA: Diagnosis not present

## 2022-05-27 DIAGNOSIS — M62551 Muscle wasting and atrophy, not elsewhere classified, right thigh: Secondary | ICD-10-CM | POA: Diagnosis not present

## 2022-05-31 DIAGNOSIS — M62551 Muscle wasting and atrophy, not elsewhere classified, right thigh: Secondary | ICD-10-CM | POA: Diagnosis not present

## 2022-05-31 DIAGNOSIS — M62552 Muscle wasting and atrophy, not elsewhere classified, left thigh: Secondary | ICD-10-CM | POA: Diagnosis not present

## 2022-05-31 DIAGNOSIS — M62561 Muscle wasting and atrophy, not elsewhere classified, right lower leg: Secondary | ICD-10-CM | POA: Diagnosis not present

## 2022-05-31 DIAGNOSIS — Z471 Aftercare following joint replacement surgery: Secondary | ICD-10-CM | POA: Diagnosis not present

## 2022-05-31 DIAGNOSIS — M62562 Muscle wasting and atrophy, not elsewhere classified, left lower leg: Secondary | ICD-10-CM | POA: Diagnosis not present

## 2022-05-31 DIAGNOSIS — R262 Difficulty in walking, not elsewhere classified: Secondary | ICD-10-CM | POA: Diagnosis not present

## 2022-06-03 DIAGNOSIS — R262 Difficulty in walking, not elsewhere classified: Secondary | ICD-10-CM | POA: Diagnosis not present

## 2022-06-03 DIAGNOSIS — M62562 Muscle wasting and atrophy, not elsewhere classified, left lower leg: Secondary | ICD-10-CM | POA: Diagnosis not present

## 2022-06-03 DIAGNOSIS — M62561 Muscle wasting and atrophy, not elsewhere classified, right lower leg: Secondary | ICD-10-CM | POA: Diagnosis not present

## 2022-06-03 DIAGNOSIS — M62552 Muscle wasting and atrophy, not elsewhere classified, left thigh: Secondary | ICD-10-CM | POA: Diagnosis not present

## 2022-06-03 DIAGNOSIS — Z471 Aftercare following joint replacement surgery: Secondary | ICD-10-CM | POA: Diagnosis not present

## 2022-06-03 DIAGNOSIS — M62551 Muscle wasting and atrophy, not elsewhere classified, right thigh: Secondary | ICD-10-CM | POA: Diagnosis not present

## 2022-06-07 DIAGNOSIS — M79642 Pain in left hand: Secondary | ICD-10-CM | POA: Diagnosis not present

## 2022-06-07 DIAGNOSIS — M2540 Effusion, unspecified joint: Secondary | ICD-10-CM | POA: Diagnosis not present

## 2022-06-07 DIAGNOSIS — M069 Rheumatoid arthritis, unspecified: Secondary | ICD-10-CM | POA: Diagnosis not present

## 2022-06-07 DIAGNOSIS — M79641 Pain in right hand: Secondary | ICD-10-CM | POA: Diagnosis not present

## 2022-06-07 DIAGNOSIS — M79672 Pain in left foot: Secondary | ICD-10-CM | POA: Diagnosis not present

## 2022-06-07 DIAGNOSIS — E663 Overweight: Secondary | ICD-10-CM | POA: Diagnosis not present

## 2022-06-07 DIAGNOSIS — Z6829 Body mass index (BMI) 29.0-29.9, adult: Secondary | ICD-10-CM | POA: Diagnosis not present

## 2022-06-07 DIAGNOSIS — M79671 Pain in right foot: Secondary | ICD-10-CM | POA: Diagnosis not present

## 2022-06-07 DIAGNOSIS — M25511 Pain in right shoulder: Secondary | ICD-10-CM | POA: Diagnosis not present

## 2022-06-08 DIAGNOSIS — M62552 Muscle wasting and atrophy, not elsewhere classified, left thigh: Secondary | ICD-10-CM | POA: Diagnosis not present

## 2022-06-08 DIAGNOSIS — M62551 Muscle wasting and atrophy, not elsewhere classified, right thigh: Secondary | ICD-10-CM | POA: Diagnosis not present

## 2022-06-08 DIAGNOSIS — M62561 Muscle wasting and atrophy, not elsewhere classified, right lower leg: Secondary | ICD-10-CM | POA: Diagnosis not present

## 2022-06-08 DIAGNOSIS — Z471 Aftercare following joint replacement surgery: Secondary | ICD-10-CM | POA: Diagnosis not present

## 2022-06-08 DIAGNOSIS — M62562 Muscle wasting and atrophy, not elsewhere classified, left lower leg: Secondary | ICD-10-CM | POA: Diagnosis not present

## 2022-06-08 DIAGNOSIS — R262 Difficulty in walking, not elsewhere classified: Secondary | ICD-10-CM | POA: Diagnosis not present

## 2022-06-10 DIAGNOSIS — Z471 Aftercare following joint replacement surgery: Secondary | ICD-10-CM | POA: Diagnosis not present

## 2022-06-10 DIAGNOSIS — M62551 Muscle wasting and atrophy, not elsewhere classified, right thigh: Secondary | ICD-10-CM | POA: Diagnosis not present

## 2022-06-10 DIAGNOSIS — M62562 Muscle wasting and atrophy, not elsewhere classified, left lower leg: Secondary | ICD-10-CM | POA: Diagnosis not present

## 2022-06-10 DIAGNOSIS — R262 Difficulty in walking, not elsewhere classified: Secondary | ICD-10-CM | POA: Diagnosis not present

## 2022-06-10 DIAGNOSIS — M62552 Muscle wasting and atrophy, not elsewhere classified, left thigh: Secondary | ICD-10-CM | POA: Diagnosis not present

## 2022-06-10 DIAGNOSIS — M62561 Muscle wasting and atrophy, not elsewhere classified, right lower leg: Secondary | ICD-10-CM | POA: Diagnosis not present

## 2022-06-15 DIAGNOSIS — M62552 Muscle wasting and atrophy, not elsewhere classified, left thigh: Secondary | ICD-10-CM | POA: Diagnosis not present

## 2022-06-15 DIAGNOSIS — M62562 Muscle wasting and atrophy, not elsewhere classified, left lower leg: Secondary | ICD-10-CM | POA: Diagnosis not present

## 2022-06-15 DIAGNOSIS — Z471 Aftercare following joint replacement surgery: Secondary | ICD-10-CM | POA: Diagnosis not present

## 2022-06-15 DIAGNOSIS — M62561 Muscle wasting and atrophy, not elsewhere classified, right lower leg: Secondary | ICD-10-CM | POA: Diagnosis not present

## 2022-06-15 DIAGNOSIS — R262 Difficulty in walking, not elsewhere classified: Secondary | ICD-10-CM | POA: Diagnosis not present

## 2022-06-15 DIAGNOSIS — M62551 Muscle wasting and atrophy, not elsewhere classified, right thigh: Secondary | ICD-10-CM | POA: Diagnosis not present

## 2022-06-16 ENCOUNTER — Other Ambulatory Visit: Payer: Self-pay | Admitting: Nurse Practitioner

## 2022-06-16 ENCOUNTER — Encounter: Payer: Self-pay | Admitting: Nurse Practitioner

## 2022-06-16 DIAGNOSIS — D472 Monoclonal gammopathy: Secondary | ICD-10-CM

## 2022-06-17 DIAGNOSIS — F331 Major depressive disorder, recurrent, moderate: Secondary | ICD-10-CM | POA: Diagnosis not present

## 2022-06-17 DIAGNOSIS — F411 Generalized anxiety disorder: Secondary | ICD-10-CM | POA: Diagnosis not present

## 2022-06-20 DIAGNOSIS — F331 Major depressive disorder, recurrent, moderate: Secondary | ICD-10-CM | POA: Diagnosis not present

## 2022-06-20 DIAGNOSIS — F411 Generalized anxiety disorder: Secondary | ICD-10-CM | POA: Diagnosis not present

## 2022-06-21 DIAGNOSIS — M62551 Muscle wasting and atrophy, not elsewhere classified, right thigh: Secondary | ICD-10-CM | POA: Diagnosis not present

## 2022-06-21 DIAGNOSIS — M62552 Muscle wasting and atrophy, not elsewhere classified, left thigh: Secondary | ICD-10-CM | POA: Diagnosis not present

## 2022-06-21 DIAGNOSIS — M62561 Muscle wasting and atrophy, not elsewhere classified, right lower leg: Secondary | ICD-10-CM | POA: Diagnosis not present

## 2022-06-21 DIAGNOSIS — R262 Difficulty in walking, not elsewhere classified: Secondary | ICD-10-CM | POA: Diagnosis not present

## 2022-06-21 DIAGNOSIS — M62562 Muscle wasting and atrophy, not elsewhere classified, left lower leg: Secondary | ICD-10-CM | POA: Diagnosis not present

## 2022-06-21 DIAGNOSIS — Z471 Aftercare following joint replacement surgery: Secondary | ICD-10-CM | POA: Diagnosis not present

## 2022-06-22 ENCOUNTER — Other Ambulatory Visit: Payer: Self-pay

## 2022-06-22 ENCOUNTER — Telehealth: Payer: Self-pay | Admitting: Nurse Practitioner

## 2022-06-22 NOTE — Telephone Encounter (Signed)
Left a message about upcoming appointments dates/times.

## 2022-06-23 DIAGNOSIS — F332 Major depressive disorder, recurrent severe without psychotic features: Secondary | ICD-10-CM | POA: Diagnosis not present

## 2022-06-24 DIAGNOSIS — M62551 Muscle wasting and atrophy, not elsewhere classified, right thigh: Secondary | ICD-10-CM | POA: Diagnosis not present

## 2022-06-24 DIAGNOSIS — Z471 Aftercare following joint replacement surgery: Secondary | ICD-10-CM | POA: Diagnosis not present

## 2022-06-24 DIAGNOSIS — M62562 Muscle wasting and atrophy, not elsewhere classified, left lower leg: Secondary | ICD-10-CM | POA: Diagnosis not present

## 2022-06-24 DIAGNOSIS — M62561 Muscle wasting and atrophy, not elsewhere classified, right lower leg: Secondary | ICD-10-CM | POA: Diagnosis not present

## 2022-06-24 DIAGNOSIS — M62552 Muscle wasting and atrophy, not elsewhere classified, left thigh: Secondary | ICD-10-CM | POA: Diagnosis not present

## 2022-06-24 DIAGNOSIS — R262 Difficulty in walking, not elsewhere classified: Secondary | ICD-10-CM | POA: Diagnosis not present

## 2022-06-28 DIAGNOSIS — Z471 Aftercare following joint replacement surgery: Secondary | ICD-10-CM | POA: Diagnosis not present

## 2022-06-28 DIAGNOSIS — M62561 Muscle wasting and atrophy, not elsewhere classified, right lower leg: Secondary | ICD-10-CM | POA: Diagnosis not present

## 2022-06-28 DIAGNOSIS — M62562 Muscle wasting and atrophy, not elsewhere classified, left lower leg: Secondary | ICD-10-CM | POA: Diagnosis not present

## 2022-06-28 DIAGNOSIS — M62552 Muscle wasting and atrophy, not elsewhere classified, left thigh: Secondary | ICD-10-CM | POA: Diagnosis not present

## 2022-06-28 DIAGNOSIS — M62551 Muscle wasting and atrophy, not elsewhere classified, right thigh: Secondary | ICD-10-CM | POA: Diagnosis not present

## 2022-06-28 DIAGNOSIS — R262 Difficulty in walking, not elsewhere classified: Secondary | ICD-10-CM | POA: Diagnosis not present

## 2022-07-04 DIAGNOSIS — F331 Major depressive disorder, recurrent, moderate: Secondary | ICD-10-CM | POA: Diagnosis not present

## 2022-07-04 DIAGNOSIS — F411 Generalized anxiety disorder: Secondary | ICD-10-CM | POA: Diagnosis not present

## 2022-07-05 DIAGNOSIS — M62552 Muscle wasting and atrophy, not elsewhere classified, left thigh: Secondary | ICD-10-CM | POA: Diagnosis not present

## 2022-07-05 DIAGNOSIS — Z471 Aftercare following joint replacement surgery: Secondary | ICD-10-CM | POA: Diagnosis not present

## 2022-07-05 DIAGNOSIS — F331 Major depressive disorder, recurrent, moderate: Secondary | ICD-10-CM | POA: Diagnosis not present

## 2022-07-05 DIAGNOSIS — M62561 Muscle wasting and atrophy, not elsewhere classified, right lower leg: Secondary | ICD-10-CM | POA: Diagnosis not present

## 2022-07-05 DIAGNOSIS — M62551 Muscle wasting and atrophy, not elsewhere classified, right thigh: Secondary | ICD-10-CM | POA: Diagnosis not present

## 2022-07-05 DIAGNOSIS — M62562 Muscle wasting and atrophy, not elsewhere classified, left lower leg: Secondary | ICD-10-CM | POA: Diagnosis not present

## 2022-07-05 DIAGNOSIS — R262 Difficulty in walking, not elsewhere classified: Secondary | ICD-10-CM | POA: Diagnosis not present

## 2022-07-05 DIAGNOSIS — F411 Generalized anxiety disorder: Secondary | ICD-10-CM | POA: Diagnosis not present

## 2022-07-06 DIAGNOSIS — R197 Diarrhea, unspecified: Secondary | ICD-10-CM | POA: Diagnosis not present

## 2022-07-06 DIAGNOSIS — R1319 Other dysphagia: Secondary | ICD-10-CM | POA: Diagnosis not present

## 2022-07-07 DIAGNOSIS — Z96651 Presence of right artificial knee joint: Secondary | ICD-10-CM | POA: Diagnosis not present

## 2022-07-14 DIAGNOSIS — Z471 Aftercare following joint replacement surgery: Secondary | ICD-10-CM | POA: Diagnosis not present

## 2022-07-14 DIAGNOSIS — M62562 Muscle wasting and atrophy, not elsewhere classified, left lower leg: Secondary | ICD-10-CM | POA: Diagnosis not present

## 2022-07-14 DIAGNOSIS — M62552 Muscle wasting and atrophy, not elsewhere classified, left thigh: Secondary | ICD-10-CM | POA: Diagnosis not present

## 2022-07-14 DIAGNOSIS — R262 Difficulty in walking, not elsewhere classified: Secondary | ICD-10-CM | POA: Diagnosis not present

## 2022-07-14 DIAGNOSIS — M62551 Muscle wasting and atrophy, not elsewhere classified, right thigh: Secondary | ICD-10-CM | POA: Diagnosis not present

## 2022-07-14 DIAGNOSIS — M62561 Muscle wasting and atrophy, not elsewhere classified, right lower leg: Secondary | ICD-10-CM | POA: Diagnosis not present

## 2022-07-15 DIAGNOSIS — H6123 Impacted cerumen, bilateral: Secondary | ICD-10-CM | POA: Diagnosis not present

## 2022-07-22 DIAGNOSIS — Z471 Aftercare following joint replacement surgery: Secondary | ICD-10-CM | POA: Diagnosis not present

## 2022-07-22 DIAGNOSIS — M62551 Muscle wasting and atrophy, not elsewhere classified, right thigh: Secondary | ICD-10-CM | POA: Diagnosis not present

## 2022-07-22 DIAGNOSIS — R262 Difficulty in walking, not elsewhere classified: Secondary | ICD-10-CM | POA: Diagnosis not present

## 2022-07-22 DIAGNOSIS — M62562 Muscle wasting and atrophy, not elsewhere classified, left lower leg: Secondary | ICD-10-CM | POA: Diagnosis not present

## 2022-07-22 DIAGNOSIS — M62552 Muscle wasting and atrophy, not elsewhere classified, left thigh: Secondary | ICD-10-CM | POA: Diagnosis not present

## 2022-07-22 DIAGNOSIS — M62561 Muscle wasting and atrophy, not elsewhere classified, right lower leg: Secondary | ICD-10-CM | POA: Diagnosis not present

## 2022-07-27 DIAGNOSIS — N1832 Chronic kidney disease, stage 3b: Secondary | ICD-10-CM | POA: Diagnosis not present

## 2022-07-27 DIAGNOSIS — D472 Monoclonal gammopathy: Secondary | ICD-10-CM | POA: Diagnosis not present

## 2022-07-27 DIAGNOSIS — I129 Hypertensive chronic kidney disease with stage 1 through stage 4 chronic kidney disease, or unspecified chronic kidney disease: Secondary | ICD-10-CM | POA: Diagnosis not present

## 2022-07-27 DIAGNOSIS — I5030 Unspecified diastolic (congestive) heart failure: Secondary | ICD-10-CM | POA: Diagnosis not present

## 2022-07-27 DIAGNOSIS — D631 Anemia in chronic kidney disease: Secondary | ICD-10-CM | POA: Diagnosis not present

## 2022-07-27 DIAGNOSIS — N189 Chronic kidney disease, unspecified: Secondary | ICD-10-CM | POA: Diagnosis not present

## 2022-07-27 DIAGNOSIS — E559 Vitamin D deficiency, unspecified: Secondary | ICD-10-CM | POA: Diagnosis not present

## 2022-07-27 DIAGNOSIS — N2581 Secondary hyperparathyroidism of renal origin: Secondary | ICD-10-CM | POA: Diagnosis not present

## 2022-07-28 DIAGNOSIS — J029 Acute pharyngitis, unspecified: Secondary | ICD-10-CM | POA: Diagnosis not present

## 2022-07-28 DIAGNOSIS — Z6832 Body mass index (BMI) 32.0-32.9, adult: Secondary | ICD-10-CM | POA: Diagnosis not present

## 2022-07-28 DIAGNOSIS — J111 Influenza due to unidentified influenza virus with other respiratory manifestations: Secondary | ICD-10-CM | POA: Diagnosis not present

## 2022-07-28 DIAGNOSIS — R059 Cough, unspecified: Secondary | ICD-10-CM | POA: Diagnosis not present

## 2022-07-29 DIAGNOSIS — Z87891 Personal history of nicotine dependence: Secondary | ICD-10-CM | POA: Diagnosis not present

## 2022-07-30 DIAGNOSIS — R1319 Other dysphagia: Secondary | ICD-10-CM | POA: Diagnosis not present

## 2022-07-30 DIAGNOSIS — Z1211 Encounter for screening for malignant neoplasm of colon: Secondary | ICD-10-CM | POA: Diagnosis not present

## 2022-08-03 DIAGNOSIS — F331 Major depressive disorder, recurrent, moderate: Secondary | ICD-10-CM | POA: Diagnosis not present

## 2022-08-03 DIAGNOSIS — F411 Generalized anxiety disorder: Secondary | ICD-10-CM | POA: Diagnosis not present

## 2022-08-04 DIAGNOSIS — Z471 Aftercare following joint replacement surgery: Secondary | ICD-10-CM | POA: Diagnosis not present

## 2022-08-04 DIAGNOSIS — R262 Difficulty in walking, not elsewhere classified: Secondary | ICD-10-CM | POA: Diagnosis not present

## 2022-08-04 DIAGNOSIS — M62561 Muscle wasting and atrophy, not elsewhere classified, right lower leg: Secondary | ICD-10-CM | POA: Diagnosis not present

## 2022-08-04 DIAGNOSIS — M62552 Muscle wasting and atrophy, not elsewhere classified, left thigh: Secondary | ICD-10-CM | POA: Diagnosis not present

## 2022-08-04 DIAGNOSIS — M62562 Muscle wasting and atrophy, not elsewhere classified, left lower leg: Secondary | ICD-10-CM | POA: Diagnosis not present

## 2022-08-04 DIAGNOSIS — M62551 Muscle wasting and atrophy, not elsewhere classified, right thigh: Secondary | ICD-10-CM | POA: Diagnosis not present

## 2022-08-09 DIAGNOSIS — R9389 Abnormal findings on diagnostic imaging of other specified body structures: Secondary | ICD-10-CM | POA: Diagnosis not present

## 2022-08-11 DIAGNOSIS — F332 Major depressive disorder, recurrent severe without psychotic features: Secondary | ICD-10-CM | POA: Diagnosis not present

## 2022-08-15 ENCOUNTER — Other Ambulatory Visit: Payer: Self-pay | Admitting: Neurology

## 2022-08-15 DIAGNOSIS — G250 Essential tremor: Secondary | ICD-10-CM

## 2022-08-15 DIAGNOSIS — Z96651 Presence of right artificial knee joint: Secondary | ICD-10-CM | POA: Insufficient documentation

## 2022-08-16 ENCOUNTER — Other Ambulatory Visit: Payer: Self-pay | Admitting: Neurology

## 2022-08-16 DIAGNOSIS — G250 Essential tremor: Secondary | ICD-10-CM

## 2022-08-18 ENCOUNTER — Other Ambulatory Visit (HOSPITAL_COMMUNITY): Payer: Self-pay | Admitting: *Deleted

## 2022-08-18 DIAGNOSIS — R059 Cough, unspecified: Secondary | ICD-10-CM

## 2022-08-18 DIAGNOSIS — R1310 Dysphagia, unspecified: Secondary | ICD-10-CM

## 2022-08-18 DIAGNOSIS — Z96651 Presence of right artificial knee joint: Secondary | ICD-10-CM | POA: Diagnosis not present

## 2022-08-22 IMAGING — CR DG CHEST 2V
2 series · 3 of 3 positions shown · non-contrast
Comparison: Chest x-ray 12/28/2009

CLINICAL DATA: Chest pain, left lower.

EXAM:
CHEST - 2 VIEW

[chest lat]
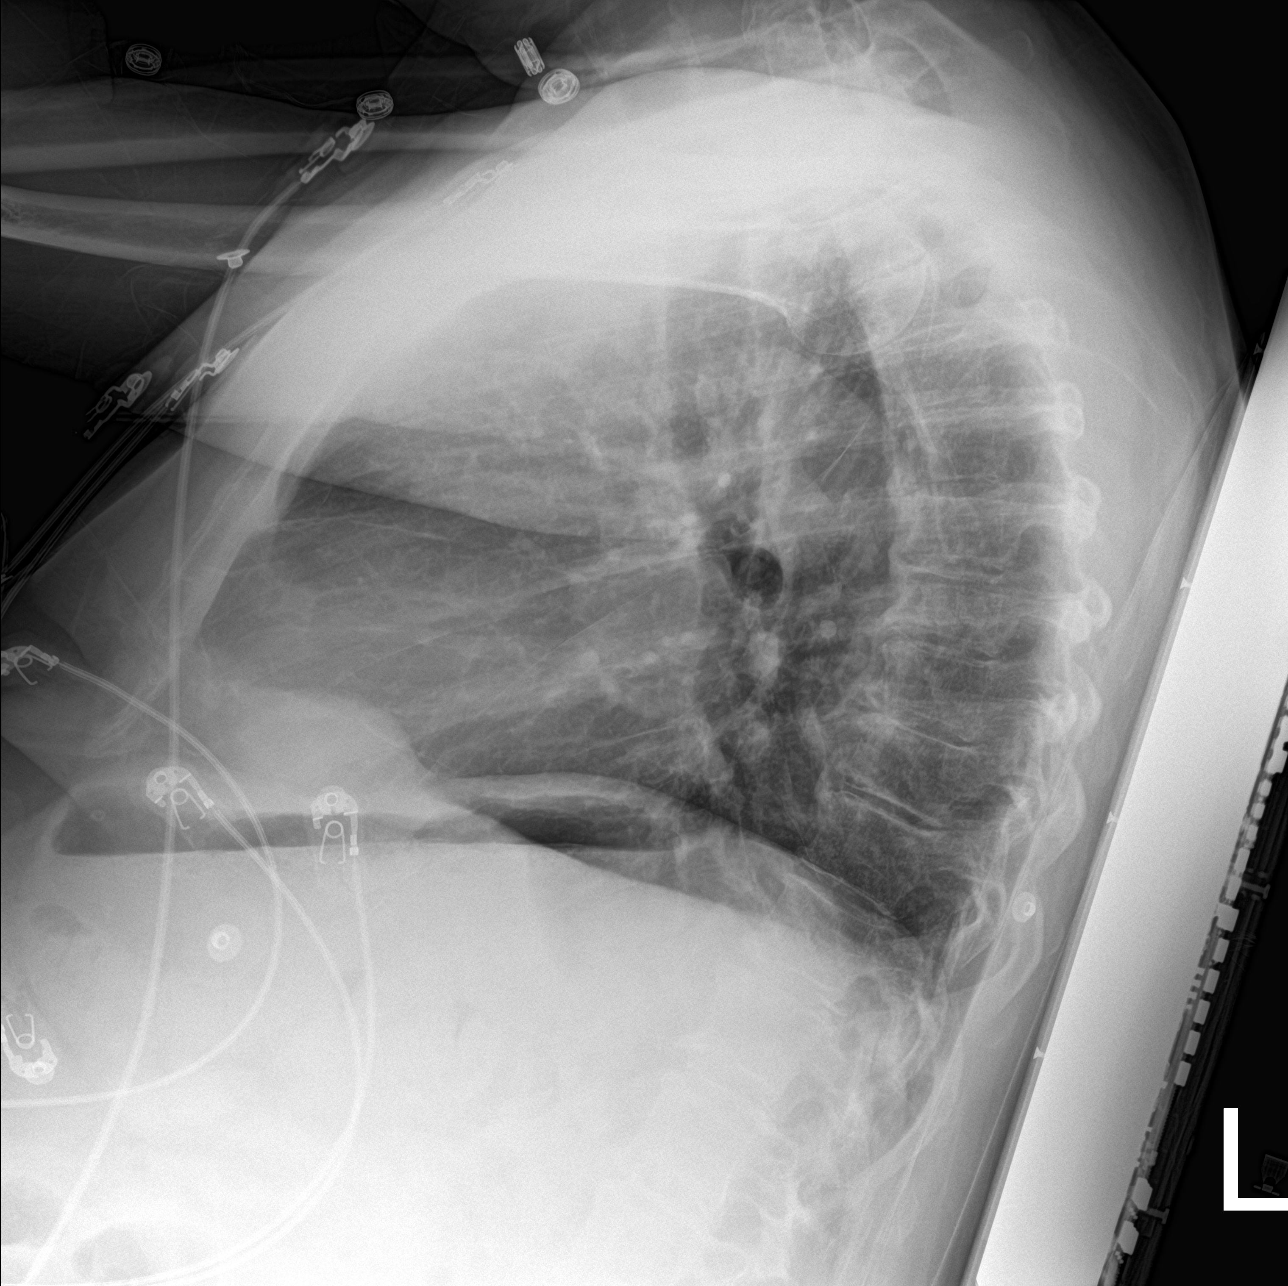

[Series 3: chest ap · 0.14mm/px · 2 of 2 slices shown]
[im 1/2]
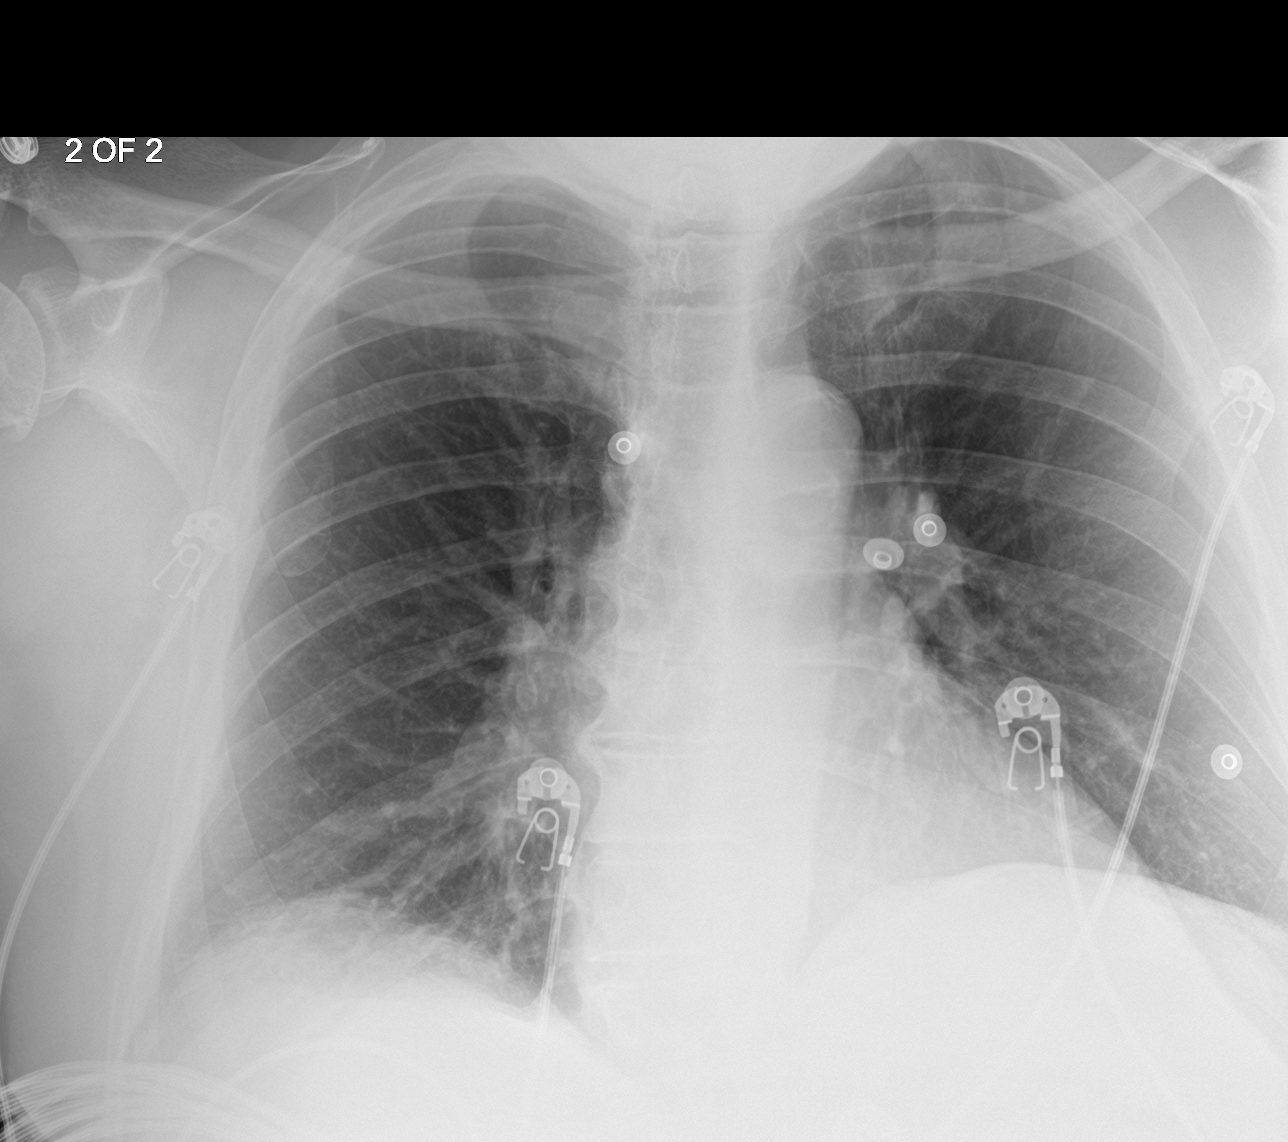
[im 2/2]
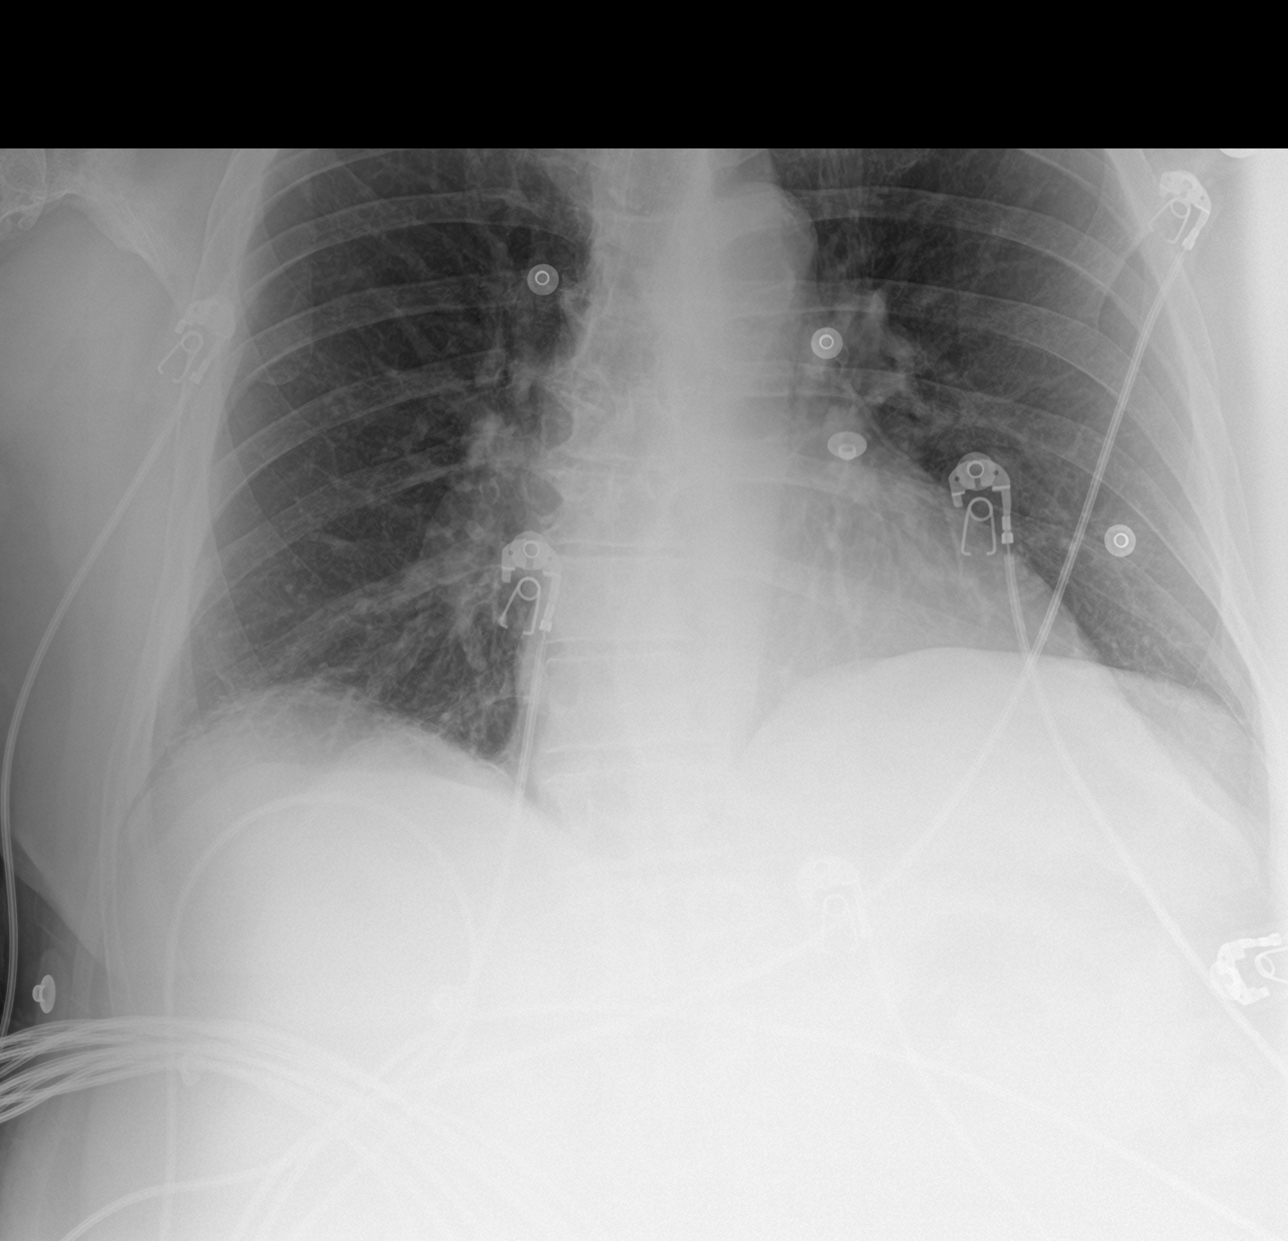

[3 of 3 positions shown; findings below may reference images not displayed]

FINDINGS: The heart size and mediastinal contours are within normal limits.
Aortic arch calcifications.

Right base airspace opacity. No focal consolidation. No pulmonary
edema. No pleural effusion. No pneumothorax. Persistent elevation of
the left hemidiaphragm.

No acute osseous abnormality. Multilevel degenerative changes of the
spine with a midthoracic vertebral body demonstrating vertebral body
height loss.
IMPRESSION: 1. Right base airspace opacity likely represents compressive
changes. Overlying infection/inflammation not excluded.
2. Age-indeterminate midthoracic vertebral body compression
fracture. Correlate with point tenderness to evaluate for acute
component.

## 2022-08-23 ENCOUNTER — Encounter: Payer: Self-pay | Admitting: Nurse Practitioner

## 2022-08-23 DIAGNOSIS — A6002 Herpesviral infection of other male genital organs: Secondary | ICD-10-CM | POA: Diagnosis not present

## 2022-08-23 DIAGNOSIS — N529 Male erectile dysfunction, unspecified: Secondary | ICD-10-CM | POA: Diagnosis not present

## 2022-08-23 DIAGNOSIS — R1312 Dysphagia, oropharyngeal phase: Secondary | ICD-10-CM | POA: Diagnosis not present

## 2022-08-23 DIAGNOSIS — I10 Essential (primary) hypertension: Secondary | ICD-10-CM | POA: Diagnosis not present

## 2022-08-23 DIAGNOSIS — N1832 Chronic kidney disease, stage 3b: Secondary | ICD-10-CM | POA: Diagnosis not present

## 2022-08-23 DIAGNOSIS — Z8673 Personal history of transient ischemic attack (TIA), and cerebral infarction without residual deficits: Secondary | ICD-10-CM | POA: Diagnosis not present

## 2022-08-23 DIAGNOSIS — E6609 Other obesity due to excess calories: Secondary | ICD-10-CM | POA: Diagnosis not present

## 2022-08-23 DIAGNOSIS — E559 Vitamin D deficiency, unspecified: Secondary | ICD-10-CM | POA: Diagnosis not present

## 2022-08-23 DIAGNOSIS — E7800 Pure hypercholesterolemia, unspecified: Secondary | ICD-10-CM | POA: Diagnosis not present

## 2022-08-23 DIAGNOSIS — Z6832 Body mass index (BMI) 32.0-32.9, adult: Secondary | ICD-10-CM | POA: Diagnosis not present

## 2022-08-23 DIAGNOSIS — R7303 Prediabetes: Secondary | ICD-10-CM | POA: Diagnosis not present

## 2022-08-24 IMAGING — MR MR ABDOMEN WO/W CM MRCP
10 of 23 series · 15 of 48 positions shown · IV contrast (G GAD)
Comparison: Abdominal ultrasound 12/15/2019

CLINICAL DATA: Right upper quadrant abdominal pain, cholelithiasis,
elevated liver enzymes.

EXAM:
MRI ABDOMEN WITHOUT AND WITH CONTRAST (INCLUDING MRCP)
TECHNIQUE: Multiplanar multisequence MR imaging of the abdomen was performed
both before and after the administration of intravenous contrast.
Heavily T2-weighted images of the biliary and pancreatic ducts were
obtained, and three-dimensional MRCP images were rendered by post
processing.
CONTRAST:  9mL GADAVIST GADOBUTROL 1 MMOL/ML IV SOLN

[Series 3: cor ssfse nav · coronal · 6.0mm · 0.78mm/px · 1 of 38 slices shown]
[im 1/38]
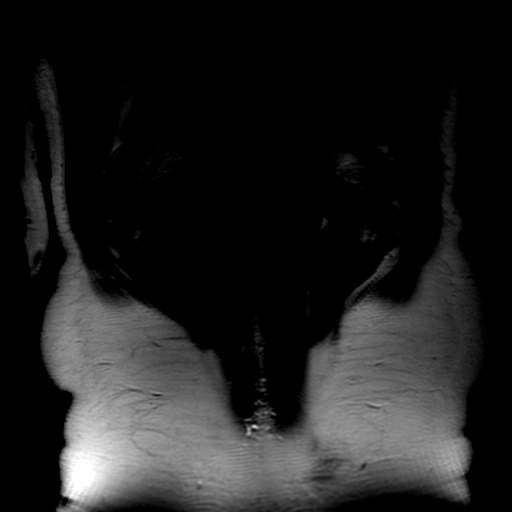

[Series 4: ax ssfse nav · axial · 6.0mm · 0.74mm/px · 1 of 34 slices shown]
[im 1/34]
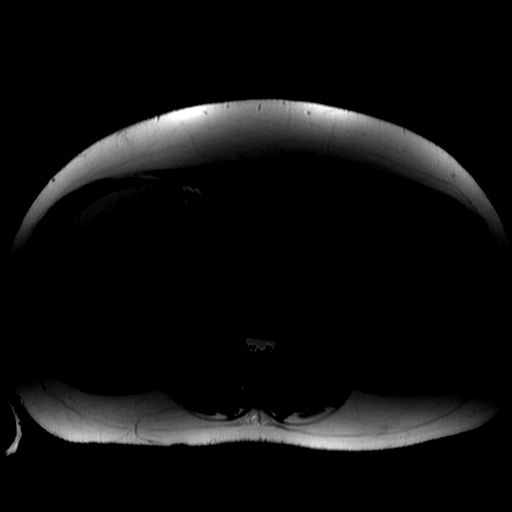

[Series 5: T2 fat-sat · axial · 6.0mm · 0.74mm/px · 1 of 30 slices shown (1 of 2)]
[im 1/30]
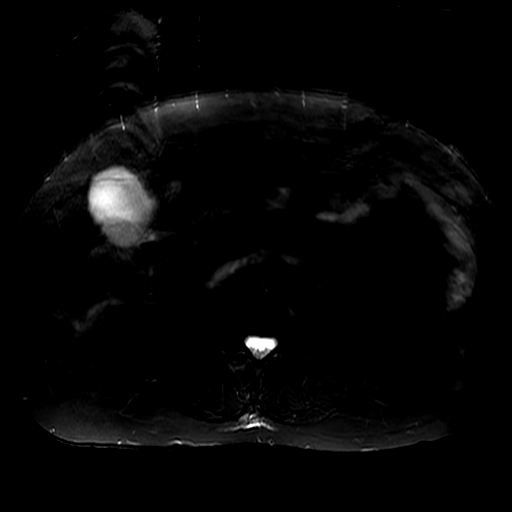

[Series 8: T2 fat-sat · axial · 6.0mm · 0.82mm/px · 1 of 28 slices shown (2 of 2)]
[im 1/28]
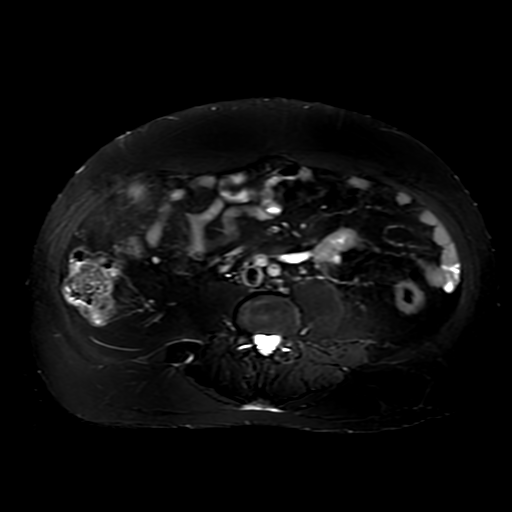

[Series 11: DWI b500 · axial · 8.0mm · 1.48mm/px · 1 of 46 slices shown]
[im 1/46]
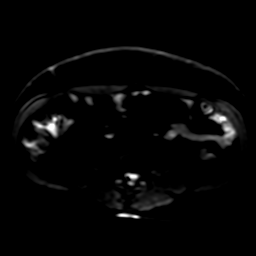

[Series 13: T1 dynamic · axial · 5.0mm · 0.82mm/px · z∈[-121,+137]mm · 3 of 104 slices shown (1 of 2)]
[im 1/104]
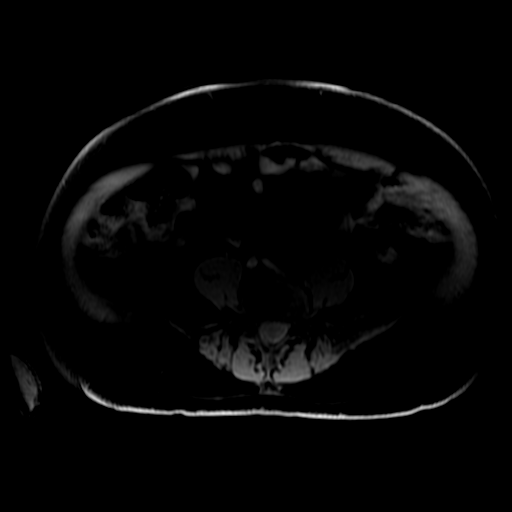
[im 52/104]
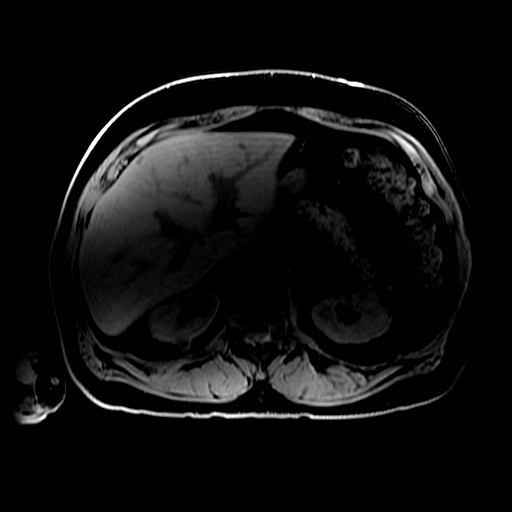
[im 104/104]
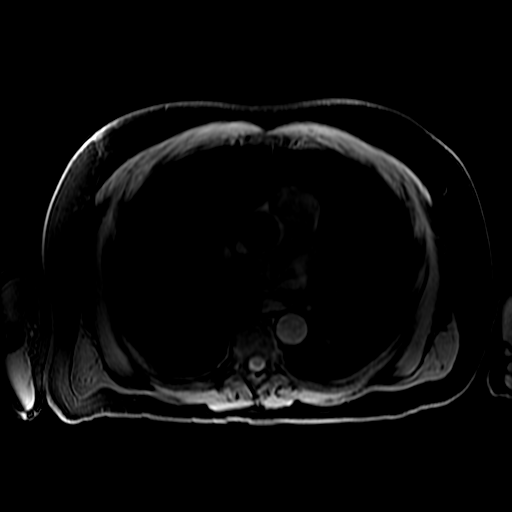

[Series 14: radial 2d thick · sagittal · 40.0mm · 0.86mm/px · 1 of 6 slices shown]
[im 1/6]
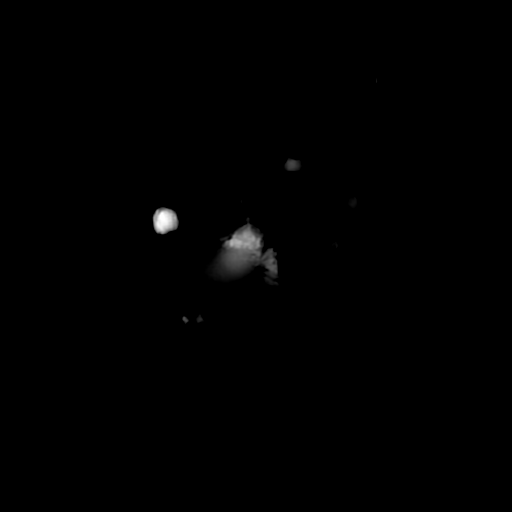

[Series 15: bSSFP · coronal · 6.0mm · 0.78mm/px · 1 of 36 slices shown]
[im 1/36]
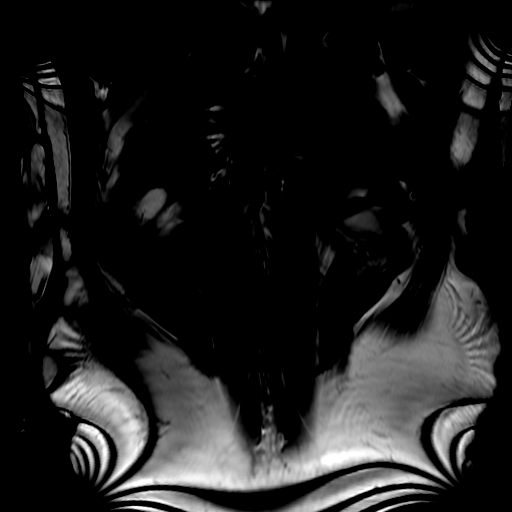

[Series 17: T1 dynamic · coronal · 3.4mm · 1.56mm/px · 4 of 128 slices shown (2 of 2)]
[im 1/128]
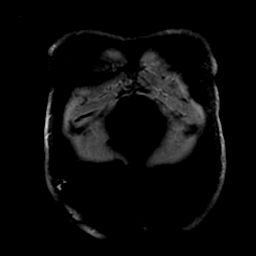
[im 43/128]
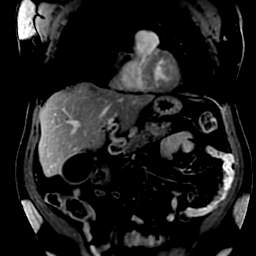
[im 85/128]
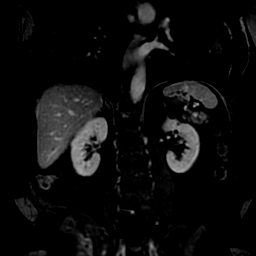
[im 128/128]
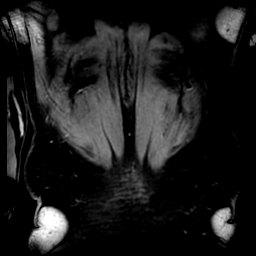

[Series 1050: projection images · sagittal · 1.2mm · 0.30mm/px · 1 of 12 slices shown]
[im 1/12]
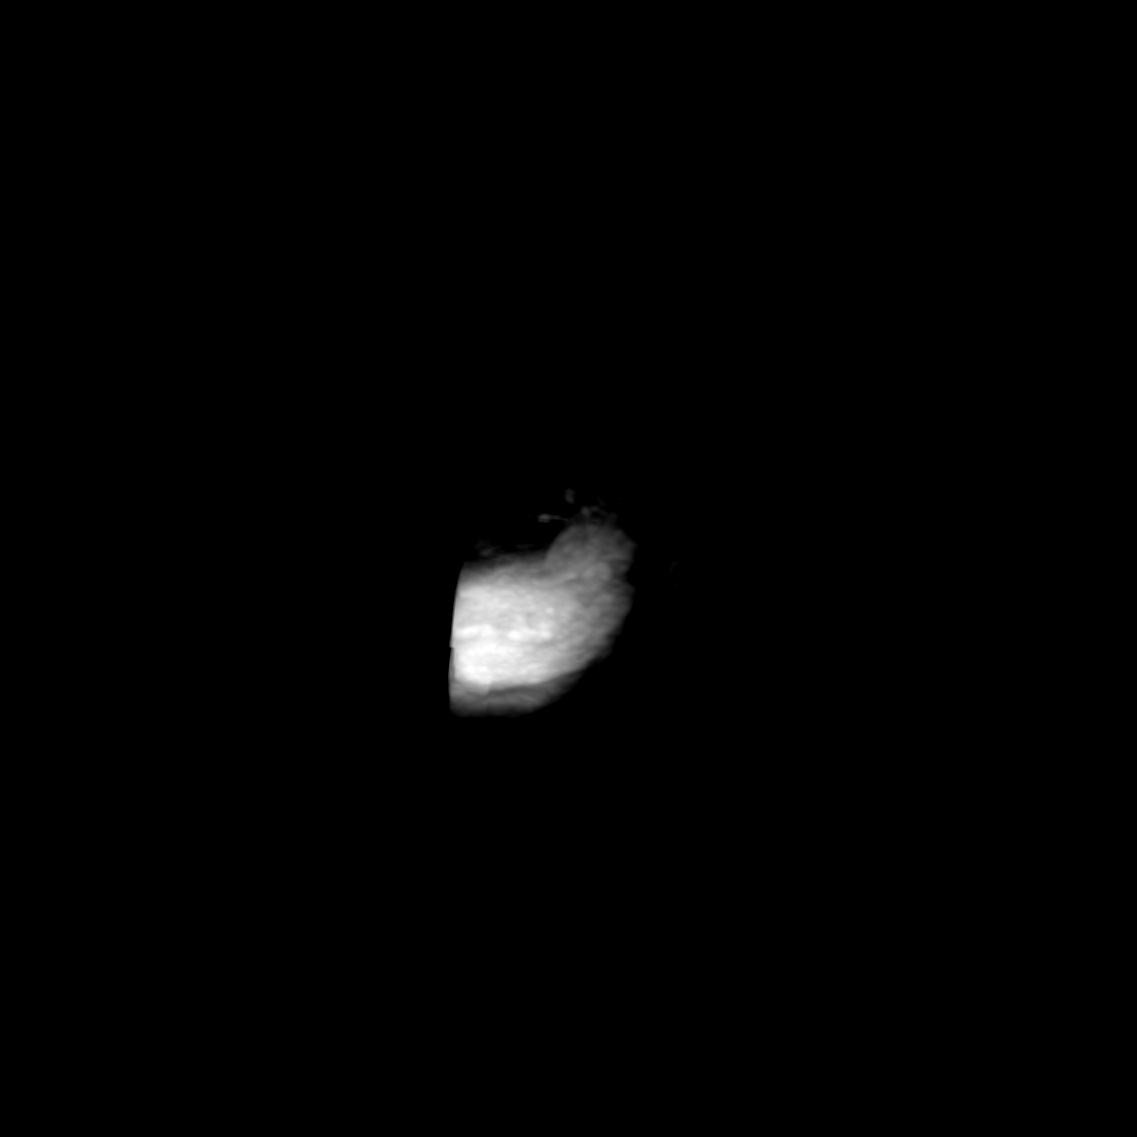

[15 of 48 positions shown; findings below may reference images not displayed]

FINDINGS: Despite efforts by the technologist and patient, motion artifact is
present on today's exam and could not be eliminated. This reduces
exam sensitivity and specificity.

Lower chest: Unremarkable

Hepatobiliary: Numerous scattered cysts in the liver of varying
sizes.

Small filling defects in the gallbladder as on image 20 of series 8
compatible with at least 2 small gallstones. Borderline appearance
for gallbladder wall thickening. No biliary dilatation. No observed
filling defect in the biliary tree, to the limits allowed by the
motion artifact.

Very minimal dropout of signal on out of phase images in the liver
may reflect minimal hepatic steatosis.

No significant abnormal hepatic enhancement.

Pancreas:  Unremarkable

Spleen:  Unremarkable

Adrenals/Urinary Tract: Small bilateral renal cysts. The left kidney
upper pole posterior exophytic cyst on image 60 of series 0644
measures 1.4 by 1.3 cm and is mildly complex but does not enhance,
compatible with a small Bosniak category 2 cyst. This is benign and
does not require further workup. Adrenal glands unremarkable.

Stomach/Bowel: Unremarkable

Vascular/Lymphatic: Aortoiliac atherosclerotic vascular disease. No
pathologic adenopathy identified.

Other:  No supplemental non-categorized findings.

Musculoskeletal: Dextroconvex thoracolumbar scoliosis. Postoperative
findings at L5-S1 seen on the coronal images.
IMPRESSION: 1. Cholelithiasis with borderline appearance for gallbladder wall
thickening. No biliary dilatation or evidence of
choledocholithiasis.
2. Numerous scattered cysts in the liver.
3. Very minimal dropout of signal on out of phase images in the
liver may reflect minimal hepatic steatosis.
4. Dextroconvex thoracolumbar scoliosis. Postoperative findings at
L5-S1.
5.  Aortic Atherosclerosis (SYNS4-AY6.6).
6. Benign-appearing bilateral renal cysts.

## 2022-08-25 ENCOUNTER — Telehealth: Payer: Self-pay

## 2022-08-25 NOTE — Telephone Encounter (Addendum)
Faxed last office note as per Santiago Glad NP. Received fax confirmation.    ----- Message from Pollyann Samples sent at 08/25/2022  2:15 PM EDT ----- Please fax my last note to pt's PCP, Dr. Knox Royalty  Thanks, Clayborn Heron

## 2022-08-31 DIAGNOSIS — F331 Major depressive disorder, recurrent, moderate: Secondary | ICD-10-CM | POA: Diagnosis not present

## 2022-08-31 DIAGNOSIS — F411 Generalized anxiety disorder: Secondary | ICD-10-CM | POA: Diagnosis not present

## 2022-09-01 ENCOUNTER — Ambulatory Visit (HOSPITAL_COMMUNITY)
Admission: RE | Admit: 2022-09-01 | Discharge: 2022-09-01 | Disposition: A | Discharge status: Home / Self Care | Payer: Medicare Other | Source: Ambulatory Visit | Attending: Family Medicine | Admitting: Family Medicine

## 2022-09-01 DIAGNOSIS — R1319 Other dysphagia: Secondary | ICD-10-CM | POA: Insufficient documentation

## 2022-09-01 DIAGNOSIS — R1310 Dysphagia, unspecified: Secondary | ICD-10-CM

## 2022-09-01 DIAGNOSIS — R059 Cough, unspecified: Secondary | ICD-10-CM

## 2022-09-01 NOTE — Therapy (Signed)
Modified Barium Swallow Study  Patient Details  Name: [name] MRN: [id number] Date of Birth: 1951/01/13  Today's Date: 09/01/2022  Modified Barium Swallow completed.  Full report located under Chart Review in the Imaging Section.  History of Present Illness [name] is a 72 y.o. male with PMH: anxiety, depression, CVA, memory loss, GERD, HLD, HTN. He presented to this outpatient MBS due to c/o "sporadic" incidents of feeling that food is getting stuck in his throat, resulting in coughing that can be "violent" at times. He reports that during these incidents, he is unable to cough food out. Eventually, this will subside and he is able to continue eating. He denies any current or recent GERD symptoms.   Clinical Impression Patient presents with an oropharyngeal swallow that is largely [facility]. Anterior hyoid excursion, laryngeal elevation and epiglottic inversion were all complete. No delays or impairment of PES opening and no stasis or retrograde movement of barium in upper esophagus. No penetration or aspiration observed with any of the tested barium consistencies. After initial swallows of liquids, patient did exhibit mild amount of spillage of residual liquid that had remained in oral cavity, into vallecular sinus. 13mm tablet transited pharyngeally, through PES and through esophagus without difficulty or delay. Esophageal sweep did show what appeared to be retrograde movement of barium in distal esophagus. Patient's symptoms not explained by results of this MBS. Factors that may increase risk of adverse event in presence of aspiration [employer] 2021): Reduced cognitive function  Swallow Evaluation Recommendations Recommendations: PO diet PO Diet Recommendation: Regular;Thin liquids (Level 0) Liquid Administration via: Cup;Straw Medication Administration: Whole meds with liquid Swallowing strategies  : Slow rate;Small bites/sips Postural changes: Stay upright 30-60 min after  meals;Position pt fully upright for meals    Angela Nevin, MA, CCC-SLP Speech Therapy

## 2022-09-06 DIAGNOSIS — M069 Rheumatoid arthritis, unspecified: Secondary | ICD-10-CM | POA: Diagnosis not present

## 2022-09-06 DIAGNOSIS — M79671 Pain in right foot: Secondary | ICD-10-CM | POA: Diagnosis not present

## 2022-09-06 DIAGNOSIS — Z6830 Body mass index (BMI) 30.0-30.9, adult: Secondary | ICD-10-CM | POA: Diagnosis not present

## 2022-09-06 DIAGNOSIS — M2540 Effusion, unspecified joint: Secondary | ICD-10-CM | POA: Diagnosis not present

## 2022-09-06 DIAGNOSIS — Z79899 Other long term (current) drug therapy: Secondary | ICD-10-CM | POA: Diagnosis not present

## 2022-09-06 DIAGNOSIS — M79642 Pain in left hand: Secondary | ICD-10-CM | POA: Diagnosis not present

## 2022-09-06 DIAGNOSIS — E669 Obesity, unspecified: Secondary | ICD-10-CM | POA: Diagnosis not present

## 2022-09-06 DIAGNOSIS — M25511 Pain in right shoulder: Secondary | ICD-10-CM | POA: Diagnosis not present

## 2022-09-06 DIAGNOSIS — M79641 Pain in right hand: Secondary | ICD-10-CM | POA: Diagnosis not present

## 2022-09-06 DIAGNOSIS — M79672 Pain in left foot: Secondary | ICD-10-CM | POA: Diagnosis not present

## 2022-09-11 ENCOUNTER — Encounter (HOSPITAL_COMMUNITY): Payer: Self-pay

## 2022-09-11 ENCOUNTER — Emergency Department (HOSPITAL_COMMUNITY): Payer: Medicare Other

## 2022-09-11 ENCOUNTER — Other Ambulatory Visit: Payer: Self-pay

## 2022-09-11 ENCOUNTER — Emergency Department (HOSPITAL_COMMUNITY)
Admission: EM | Admit: 2022-09-11 | Discharge: 2022-09-11 | Disposition: A | Discharge status: Home / Self Care | Payer: Medicare Other | Attending: Emergency Medicine | Admitting: Emergency Medicine

## 2022-09-11 DIAGNOSIS — Z79899 Other long term (current) drug therapy: Secondary | ICD-10-CM | POA: Diagnosis not present

## 2022-09-11 DIAGNOSIS — R202 Paresthesia of skin: Secondary | ICD-10-CM | POA: Insufficient documentation

## 2022-09-11 DIAGNOSIS — E860 Dehydration: Secondary | ICD-10-CM | POA: Diagnosis not present

## 2022-09-11 DIAGNOSIS — D472 Monoclonal gammopathy: Secondary | ICD-10-CM | POA: Diagnosis not present

## 2022-09-11 DIAGNOSIS — G459 Transient cerebral ischemic attack, unspecified: Secondary | ICD-10-CM | POA: Diagnosis not present

## 2022-09-11 DIAGNOSIS — I10 Essential (primary) hypertension: Secondary | ICD-10-CM | POA: Insufficient documentation

## 2022-09-11 DIAGNOSIS — R9082 White matter disease, unspecified: Secondary | ICD-10-CM | POA: Diagnosis not present

## 2022-09-11 DIAGNOSIS — G4489 Other headache syndrome: Secondary | ICD-10-CM | POA: Diagnosis not present

## 2022-09-11 DIAGNOSIS — R110 Nausea: Secondary | ICD-10-CM | POA: Diagnosis not present

## 2022-09-11 DIAGNOSIS — R29818 Other symptoms and signs involving the nervous system: Secondary | ICD-10-CM | POA: Diagnosis not present

## 2022-09-11 DIAGNOSIS — Z8673 Personal history of transient ischemic attack (TIA), and cerebral infarction without residual deficits: Secondary | ICD-10-CM | POA: Diagnosis not present

## 2022-09-11 DIAGNOSIS — R42 Dizziness and giddiness: Secondary | ICD-10-CM | POA: Insufficient documentation

## 2022-09-11 DIAGNOSIS — R519 Headache, unspecified: Secondary | ICD-10-CM | POA: Diagnosis not present

## 2022-09-11 HISTORY — DX: Irritable bowel syndrome, unspecified: K58.9

## 2022-09-11 LAB — COMPREHENSIVE METABOLIC PANEL
ALT: 22 U/L (ref 0–44)
AST: 16 U/L (ref 15–41)
Albumin: 3.9 g/dL (ref 3.5–5.0)
Alkaline Phosphatase: 40 U/L (ref 38–126)
Anion gap: 10 (ref 5–15)
BUN: 15 mg/dL (ref 8–23)
CO2: 22 mmol/L (ref 22–32)
Calcium: 8.6 mg/dL — ABNORMAL LOW (ref 8.9–10.3)
Chloride: 105 mmol/L (ref 98–111)
Creatinine, Ser: 1.5 mg/dL — ABNORMAL HIGH (ref 0.61–1.24)
GFR, Estimated: 50 mL/min — ABNORMAL LOW (ref >60)
Glucose, Bld: 96 mg/dL (ref 70–99)
Potassium: 3.8 mmol/L (ref 3.5–5.1)
Sodium: 137 mmol/L (ref 135–145)
Total Bilirubin: 0.6 mg/dL (ref 0.3–1.2)
Total Protein: 6.8 g/dL (ref 6.5–8.1)

## 2022-09-11 LAB — CBC
HCT: 34.6 % — ABNORMAL LOW (ref 39.0–52.0)
Hemoglobin: 11.7 g/dL — ABNORMAL LOW (ref 13.0–17.0)
MCH: 32.4 pg (ref 26.0–34.0)
MCHC: 33.8 g/dL (ref 30.0–36.0)
MCV: 95.8 fL (ref 80.0–100.0)
Platelets: 293 10*3/uL (ref 150–400)
RBC: 3.61 MIL/uL — ABNORMAL LOW (ref 4.22–5.81)
RDW: 12.7 % (ref 11.5–15.5)
WBC: 5.1 10*3/uL (ref 4.0–10.5)
nRBC: 0 % (ref 0.0–0.2)

## 2022-09-11 NOTE — ED Triage Notes (Signed)
Upon talking with pt he denies dizziness, but states he got lightheaded and nauseated at onset. EDP at bedside.

## 2022-09-11 NOTE — Discharge Instructions (Signed)
The results in the emergency room are reassuring.  There is no evidence of stroke.  The dizziness you are experiencing could be because of medication side effects and also possibly because of mild dehydration.  Given the ambiguity with the cause, if your symptoms persist we have still want you to follow-up with neurology doctors -therefore we have put in a referral for them to see you.

## 2022-09-11 NOTE — ED Provider Notes (Signed)
[place] [facility]ERGENCY DEPARTMENT AT [facility] Provider Note   CSN: [id number] Arrival date & time: 09/11/22  1335     History  Chief Complaint  Patient presents with   Dizziness    [name] is a 72 y.o. male.  HPI    72 year old patient with history of hypertension, hyperlipidemia, previous stroke and TIA comes in with chief complaint of dizziness.  Patient last normal at about 8:25 AM when he called his brother.  Patient states that he woke up this morning and was feeling fine.  He went to the bathroom to take a shower, while in the shower he started having dizziness.  Dizziness described as feeling lightheaded and unsteady.  Patient felt that he might even faint.  He thereafter went to his bedroom and laid in his bed.  He called his brother, his ex -and they all convinced him to come to the emergency room.  Patient denies any associated double vision, difficulty in swallowing, slurred speech, one-sided weakness, numbness.  He indicates that he has had TIA in the past, with that he did have associated numbness.  Patient has been taking his medications as prescribed.  He denies any blood loss or severe diarrhea that is new.  Patient's daughter is at the bedside.  She she is the medical power of attorney and indicates that patient is a patient of Dr. CT, neurology.  Upon reviewing patient's records, it appears that he also has MGUS.  Patient also seen by ENT in June for cerumen impaction.  Patient denies any hearing loss, ringing in the ear.  Home Medications Prior to Admission medications   Medication Sig Start Date End Date Taking? Authorizing Provider  acetaminophen (TYLENOL) 500 MG tablet Take 1-2 tablets (500-1,000 mg total) by mouth every 6 (six) hours as needed for moderate pain. 11/18/21   Edmisten, Kristie L, PA  amLODipine (NORVASC) 5 MG tablet Take 1 tablet (5 mg total) by mouth daily. 03/12/20   Chandrasekhar, Mahesh A, MD  busPIRone (BUSPAR) 30 MG tablet  Take 30 mg by mouth 2 (two) times daily.    [provider]  candesartan (ATACAND) 32 MG tablet Take 32 mg by mouth daily. 11/22/19   [provider]  cetirizine (ZYRTEC ALLERGY) 10 MG tablet Take 1 tablet (10 mg total) by mouth daily. Patient taking differently: Take 10 mg by mouth at bedtime. 04/25/22   Wallis Bamberg, PA-C  clopidogrel (PLAVIX) 75 MG tablet Take 75 mg by mouth daily. 03/22/22   [provider]  fluticasone (FLONASE) 50 MCG/ACT nasal spray Place 2 sprays into both nostrils daily. 04/25/22   Wallis Bamberg, PA-C  folic acid (FOLVITE) 1 MG tablet Take 1 mg by mouth daily. 11/10/21   [provider]  furosemide (LASIX) 40 MG tablet Take 40 mg by mouth in the morning.    [provider]  hydrALAZINE (APRESOLINE) 50 MG tablet Take 1 tablet (50 mg total) by mouth 2 (two) times daily. 03/16/20   Christell Constant, MD  hydroxychloroquine (PLAQUENIL) 200 MG tablet Take 400 mg by mouth at bedtime.    [provider]  metoprolol succinate (TOPROL XL) 25 MG 24 hr tablet Take 1 tablet (25 mg total) by mouth daily. 09/24/21   Chandrasekhar, Rondel Jumbo, MD  PARoxetine (PAXIL-CR) 37.5 MG 24 hr tablet Take 1 tablet (37.5 mg total) by mouth at bedtime. Patient taking differently: Take 37.5 mg by mouth daily. 09/03/21   Shanna Cisco, NP  primidone (MYSOLINE)  250 MG tablet TAKE 1 TABLET BY MOUTH TWICE A DAY 08/16/22   Micki Riley, MD  rosuvastatin (CRESTOR) 40 MG tablet Take 40 mg by mouth daily.    [provider]  sildenafil (VIAGRA) 50 MG tablet Take 100 mg by mouth as needed for erectile dysfunction.    [provider]  spironolactone (ALDACTONE) 25 MG tablet Take 25 mg by mouth daily. 09/14/20   [provider]  Suvorexant (BELSOMRA) 10 MG TABS Take 1 tablet by mouth at bedtime. 09/03/21   Shanna Cisco, NP  traZODone (DESYREL) 150 MG tablet Take 1 tablet (150 mg total) by mouth at bedtime as needed. for  sleep Patient taking differently: Take 150 mg by mouth as needed. for sleep 09/03/21   Shanna Cisco, NP  valACYclovir (VALTREX) 1000 MG tablet Take 1,000 mg by mouth daily.    [provider]  Vitamin D, Ergocalciferol, (DRISDOL) 1.25 MG (50000 UNIT) CAPS capsule Take 50,000 Units by mouth every Saturday. 09/17/20   [provider]      Allergies    Amitriptyline, Aspirin, Lisinopril, Lithium, Morphine, Nsaids, Oxycodone, Oxycodone hcl, Oxycodone-acetaminophen, Pork allergy, and Pork-derived products    Review of Systems   Review of Systems  All other systems reviewed and are negative.   Physical Exam Updated Vital Signs BP 129/86 (BP Location: Right Arm)   Pulse (!) 53   Temp 98.3 F (36.8 C) (Oral)   Resp 16   Ht 5\' 7"  (1.702 m)   Wt 86 kg   SpO2 99%   BMI 29.69 kg/m  Physical Exam Vitals and nursing note reviewed.  Constitutional:      Appearance: He is well-developed.  HENT:     Head: Atraumatic.  Eyes:     Extraocular Movements: Extraocular movements intact.     Pupils: Pupils are equal, round, and reactive to light.     Comments: No nystagmus  Cardiovascular:     Rate and Rhythm: Normal rate.  Pulmonary:     Effort: Pulmonary effort is normal.  Musculoskeletal:     Cervical back: Neck supple.  Skin:    General: Skin is warm.  Neurological:     Mental Status: He is alert and oriented to person, place, and time.     Cranial Nerves: No cranial nerve deficit.     Sensory: No sensory deficit.     Motor: No weakness.     Coordination: Coordination normal.     Comments: No aphasia, no dysarthria  Psychiatric:        Mood and Affect: Mood normal.     ED Results / Procedures / Treatments   Labs (all labs ordered are listed, but only abnormal results are displayed) Labs Reviewed  CBC - Abnormal; Notable for the following components:      Result Value   RBC 3.61 (*)    Hemoglobin 11.7 (*)    HCT 34.6 (*)    All other components within  normal limits  COMPREHENSIVE METABOLIC PANEL - Abnormal; Notable for the following components:   Creatinine, Ser 1.50 (*)    Calcium 8.6 (*)    GFR, Estimated 50 (*)    All other components within normal limits  RAPID URINE DRUG SCREEN, HOSP PERFORMED  URINALYSIS, ROUTINE W REFLEX MICROSCOPIC    EKG EKG Interpretation Date/Time:  Sunday September 11 2022 13:42:55 EDT Ventricular Rate:  55 PR Interval:  207 QRS Duration:  150 QT Interval:  501 QTC Calculation: 480 R Axis:   -  72  Text Interpretation: Sinus rhythm RBBB and LAFB No acute changes No significant change since last tracing Confirmed by Derwood Kaplan [phone]) on 09/11/2022 2:12:09 PM  Radiology MR BRAIN WO CONTRAST  Result Date: 09/11/2022 CLINICAL DATA:  Neuro deficit, acute, stroke suspected. Lightheadedness/dizziness and headache. EXAM: MRI HEAD WITHOUT CONTRAST MRA HEAD WITHOUT CONTRAST TECHNIQUE: Multiplanar, multi-echo pulse sequences of the brain and surrounding structures were acquired without intravenous contrast. Angiographic images of the Circle of Willis were acquired using MRA technique without intravenous contrast. COMPARISON:  Head CT 09/11/2022. Head MRI 06/19/2020. Head CTA 12/19/2009. FINDINGS: MRI HEAD FINDINGS Brain: There is no evidence of an acute infarct, intracranial hemorrhage, mass, midline shift, or extra-axial fluid collection. Mild cerebral atrophy is within normal limits for age. T2 hyperintensities in the cerebral white matter, including confluent signal abnormality in the periventricular regions, are unchanged from the prior MRI. Vascular: Major intracranial vascular flow voids are preserved. Skull and upper cervical spine: Unremarkable bone marrow signal. Sinuses/Orbits: Mild mucosal thickening in the right maxillary sinus. Clear mastoid air cells. Other: None. MRA HEAD FINDINGS The study is mildly to moderately motion degraded. Anterior circulation: The internal carotid arteries are patent from skull  base to carotid termini without evidence of a significant stenosis within limitations of motion artifact. ACAs and MCAs are patent without evidence of a flow limiting proximal stenosis within limitations of motion artifact. No aneurysm is identified. Posterior circulation: The included intracranial right vertebral artery is widely patent and strongly dominant, supplying the basilar. The distal left vertebral artery is extremely hypoplastic. Patent AICA and SCA origins are visualized bilaterally. The basilar artery is patent without evidence of significant stenosis. There are large right and small left posterior communicating arteries with hypoplasia of the right P1 segment. Both PCAs are patent without evidence of a significant proximal stenosis. No aneurysm is identified. Anatomic variants: Predominantly fetal type origin of the right PCA. IMPRESSION: 1. No acute intracranial abnormality. 2. Chronically age advanced cerebral white matter disease, similar to 2022 and potentially reflecting chronic small vessel ischemia or chronic demyelinating disease. 3. Motion degraded head MRA without evidence of a large vessel occlusion or flow limiting proximal stenosis. Electronically Signed   By: Sebastian Ache M.D.   On: 09/11/2022 15:58   MR ANGIO HEAD WO CONTRAST  Result Date: 09/11/2022 CLINICAL DATA:  Neuro deficit, acute, stroke suspected. Lightheadedness/dizziness and headache. EXAM: MRI HEAD WITHOUT CONTRAST MRA HEAD WITHOUT CONTRAST TECHNIQUE: Multiplanar, multi-echo pulse sequences of the brain and surrounding structures were acquired without intravenous contrast. Angiographic images of the Circle of Willis were acquired using MRA technique without intravenous contrast. COMPARISON:  Head CT 09/11/2022. Head MRI 06/19/2020. Head CTA 12/19/2009. FINDINGS: MRI HEAD FINDINGS Brain: There is no evidence of an acute infarct, intracranial hemorrhage, mass, midline shift, or extra-axial fluid collection. Mild cerebral  atrophy is within normal limits for age. T2 hyperintensities in the cerebral white matter, including confluent signal abnormality in the periventricular regions, are unchanged from the prior MRI. Vascular: Major intracranial vascular flow voids are preserved. Skull and upper cervical spine: Unremarkable bone marrow signal. Sinuses/Orbits: Mild mucosal thickening in the right maxillary sinus. Clear mastoid air cells. Other: None. MRA HEAD FINDINGS The study is mildly to moderately motion degraded. Anterior circulation: The internal carotid arteries are patent from skull base to carotid termini without evidence of a significant stenosis within limitations of motion artifact. ACAs and MCAs are patent without evidence of a flow limiting proximal stenosis within limitations of motion artifact. No aneurysm  is identified. Posterior circulation: The included intracranial right vertebral artery is widely patent and strongly dominant, supplying the basilar. The distal left vertebral artery is extremely hypoplastic. Patent AICA and SCA origins are visualized bilaterally. The basilar artery is patent without evidence of significant stenosis. There are large right and small left posterior communicating arteries with hypoplasia of the right P1 segment. Both PCAs are patent without evidence of a significant proximal stenosis. No aneurysm is identified. Anatomic variants: Predominantly fetal type origin of the right PCA. IMPRESSION: 1. No acute intracranial abnormality. 2. Chronically age advanced cerebral white matter disease, similar to 2022 and potentially reflecting chronic small vessel ischemia or chronic demyelinating disease. 3. Motion degraded head MRA without evidence of a large vessel occlusion or flow limiting proximal stenosis. Electronically Signed   By: Sebastian Ache M.D.   On: 09/11/2022 15:58   CT HEAD WO CONTRAST  Result Date: 09/11/2022 CLINICAL DATA:  Neuro deficit, acute, stroke suspected. Headache and  lightheadedness/dizziness. EXAM: CT HEAD WITHOUT CONTRAST TECHNIQUE: Contiguous axial images were obtained from the base of the skull through the vertex without intravenous contrast. RADIATION DOSE REDUCTION: This exam was performed according to the departmental dose-optimization program which includes automated exposure control, adjustment of the mA and/or kV according to patient size and/or use of iterative reconstruction technique. COMPARISON:  Head CT 11/27/2016 and MRI 06/19/2020 FINDINGS: Brain: There is no evidence of an acute infarct, intracranial hemorrhage, mass, midline shift, or extra-axial fluid collection. Mild cerebral atrophy is within normal limits for age. Chronic cerebral white matter hypodensities, greatest in the periventricular white matter, are advanced for age, similar to the prior CT, and nonspecific. Vascular: Calcified atherosclerosis at the skull base. No hyperdense vessel. Skull: No acute fracture or suspicious osseous lesion. Sinuses/Orbits: Mild mucosal thickening in the right maxillary sinus. Clear mastoid air cells. Unremarkable orbits. Other: None. IMPRESSION: 1. No evidence of acute intracranial abnormality. 2. Age advanced nonspecific chronic white matter disease. Electronically Signed   By: Sebastian Ache M.D.   On: 09/11/2022 15:14    Procedures Procedures    Medications Ordered in ED Medications - No data to display  ED Course/ Medical Decision Making/ A&P                                 Medical Decision Making Amount and/or Complexity of Data Reviewed Labs: ordered. Radiology: ordered.  This patient presents to the ED with chief complaint(s) of persistent dizziness and ataxia, with associated nausea with pertinent past medical history of stroke, hypertension, hyperlipidemia.The complaint involves an extensive differential diagnosis and also carries with it a high risk of complications and morbidity.    The differential diagnosis includes : Acute ischemic  stroke, orthostatic dizziness, severe electrolyte abnormality, renal failure, medication side effects.  Patient was last known normal is actually about 8:30 AM. This was verified with call log, patient indicates that he called his brother after he started feeling unwell and the call was made around 8:26 AM.  We will not activate code stroke.  The initial plan is to get basic labs, MRI-MRA and CT scan of the brain.   Additional history obtained: Additional history obtained from family Records reviewed  neurology records with Dr. Pearlean Brownie   Independent labs interpretation:  The following labs were independently interpreted: Creatinine is 1.5.  It is within the range he has been in the past with creatinine of 1.49 last year. BUN is normal.  All the electrolytes are reassuring.  Patient has hemoglobin of 11.7 which is also baseline.  Independent visualization and interpretation of imaging: - I independently visualized the following imaging with scope of interpretation limited to determining acute life threatening conditions related to emergency care: CT head, which revealed no evidence of brain bleed  Treatment and Reassessment: Patient reassessed.  MRI completed.  There is no evidence of stroke on MRI.  Results of the ER workup discussed with the patient and family.  I ambulated the patient myself.  When he got up, he felt dizzy, and he remained dizzy while he walked.  He was not profoundly unsteady with his gait.  He did not need significant support.  He indicates that at rest however now he does not feel dizzy -which is an improvement since he got here.  Patient convinced that he is not dizzy.  He has no new medications on board. We have not seen any arrhythmia here that could explain his symptoms.  Family wondering if patient still should follow-up with Dr. Pearlean Brownie.  I think it is reasonable to follow-up with neurology if the symptoms continue.  We will put in ambulatory referral to  neurology.  Discussed with the patient importance of ensuring he is using good judgment when ambulating to prevent any falls. Final Clinical Impression(s) / ED Diagnoses Final diagnoses:  Lightheadedness    Rx / DC Orders ED Discharge Orders          Ordered    Ambulatory referral to Neurology       Comments: An appointment is requested in approximately: 2 weeks   09/11/22 1700              Derwood Kaplan, MD 09/11/22 1704

## 2022-09-11 NOTE — ED Triage Notes (Signed)
Pt BIB [facility] coming from [facility]. EMS report pt was in the shower 1 hour PTA and started feeling dizzy. EMS could not advise a last known well time. Per EMS their stroke screen was negative. During transport pt started c/o L sided HA and worsening dizziness. EMS states pt reported that his speech was starting to be "off," but EMS did not appreciate any speech changes.   EMS Vitals   112/69 HR 60 SpO2 100% on R/A CBG 136

## 2022-09-11 NOTE — ED Notes (Signed)
Patient transported to MRI 

## 2022-09-15 DIAGNOSIS — F332 Major depressive disorder, recurrent severe without psychotic features: Secondary | ICD-10-CM | POA: Diagnosis not present

## 2022-09-20 DIAGNOSIS — R1312 Dysphagia, oropharyngeal phase: Secondary | ICD-10-CM | POA: Diagnosis not present

## 2022-09-21 ENCOUNTER — Encounter: Payer: Self-pay | Admitting: Neurology

## 2022-09-21 ENCOUNTER — Ambulatory Visit: Payer: Medicare Other | Admitting: Neurology

## 2022-09-21 VITALS — BP 122/76 | HR 70 | Ht 67.0 in | Wt 198.0 lb

## 2022-09-21 DIAGNOSIS — R42 Dizziness and giddiness: Secondary | ICD-10-CM | POA: Diagnosis not present

## 2022-09-21 DIAGNOSIS — G3184 Mild cognitive impairment, so stated: Secondary | ICD-10-CM

## 2022-09-21 DIAGNOSIS — G250 Essential tremor: Secondary | ICD-10-CM

## 2022-09-21 NOTE — Progress Notes (Signed)
[facility] [address] [place]. [place] [address] [phone]       OFFICE FOLLOW UP VISIT NOTE  [name] Date of Birth:  August 14, 1950 Medical Record Number:  [id number]   Referring MD: Silvestre Moment  Reason for Referral: Headaches  HPI: Initial visit 06/04/2020 [name] is a 72 year old African-American male seen today for consultation visit for headaches.  History is obtained from the patient and review of referral notes and I personally reviewed electronic medical records and pertinent imaging films in PACS.  He has past medical history of hypertension, hyperlipidemia, obesity, right brain subcortical infarct in 2011.  He states over 4 months ago he developed new onset of headaches which were mostly nocturnal.  He would wake up from sleep with a severe headache which he describes as bitemporal and spreading into the retro-orbital regions severe 10/10 in intensity with burning and at times throbbing quality.  There was accompanying nausea and he occasionally vomited.  He denied any light or sound sensitivity.  Headache was not relieved by sleeping as he could not sleep.  It would last for hours.  The headache initially occurred on a daily basis for several weeks.  He saw his primary physician who gave him Tylenol with codeine which did not work.  Finally he saw Dr. Knox Royalty who prescribed gabapentin on 04/03/2020 and he has been taking it 1 tablet 3 times daily and the headaches seem to have gone within a few days of starting it.  He however seems to have gained weight particularly on his belly and wants to lose it.  He denied any accompanying symptoms in the form of blurred vision, loss of vision, jaw claudication, myalgias or scalp tenderness.  He does have remote history of occasional migraines in his 72s when he had accompanying light and sound sensitivity as well.  He felt the current headaches were different.  The patient has recently gained weight he does  snore but he has never been tested for sleep apnea.  Patient has lifelong history of mild action tremor tremor involving mostly left upper extremity felt to be related to anxiety which appears stable.  He history of right brain subcortical infarct due to small vessel disease in December 2011 with only minor residual deficits on the left.  He states he has been on Plavix ever since and not missed doses no recurrent stroke or TIA symptoms for more than 10 years.  He states his blood pressure and cholesterol has been under good control but he cannot tell me when the last time it was checked.  He was last seen in our office for follow-up by [facility] nurse practitioner on 01/09/2020. Update 05/17/2021 ; he returns for follow-up after his initial visit with me nearly a year ago.  His daughter also participated in this visit via telephone.  Patient plans to undergo elective right knee surgery by Dr. Despina Hick next Monday and is here for neurological clearance.  Patient states he is doing well from neurological standpoint.  He has no recurrent TIA stroke symptoms since 2011.  He remains on Plavix which is tolerating well without bruising or bleeding.  He states his blood pressure is under good control.  He remains on Crestor which is tolerating well without muscle aches and pains.  His last lab work on 06/04/2020 had shown LDL cholesterol of 85 mg percent and hemoglobin A1c 5.9.  ESR was 2 mm.  I had also ordered an MRI scan of the brain  at that time because you complained of new onset headaches and MRI had shown only changes of nonspecific white matter disease.  No acute abnormality.  Patient states his headaches have gone cannot remember the last time he had a headache.  He remains on gabapentin 400 twice daily which had caused some weight gain and I suggested he taper and stop it at last visit but he has not done that.  I also referred him for evaluation for sleep apnea and he did see Dr. Merton Border who recommended home sleep  study but for unclear reason that has not been done either.  He also has a essential tremor for which she takes primidone 250 twice daily and is tolerating well without significant side effects and states tremor is well controlled and not functionally disabling.  He is living in independent living but does complain of mild short-term memory difficulties.  His daughter does help arrange his medications for him.  He has been driving.  He uses GPS.  He is still independent in most activities of daily living.  He has no other complaints. Update 05/18/2022 : He returns for follow-up after last visit a year ago.  He is accompanied by his daughter.  He states he is doing well.  Has not had any headaches.  He has not had recurrent stroke or TIA symptoms for more than 10 years.  He remains on Plavix which is tolerating well with only minor bruising and no bleeding.  He states his blood pressure is under good control.  He is tolerating Crestor well without muscle aches and pains.  Continues to have mild tremor in his hands but is not functionally disabling.  He is tolerating Mysoline 250 mg twice daily quite well without any side effects.  Continues to have mild short-term memory and cognitive difficulties.  He is now living in independent living.  His medications are laid out by his daughter the week.  He does not do significant daily standing activities does not go out a lot.  He had right knee replacement surgery done in October and has recovered well from that.  He had a MoCA done on 12/07/2021 and scored 23/30 at [facility]. Update 09/21/2022 : He returns for follow-up after last visit 4 months ago.  His daughter was present over the phone during this visit.  Patient was recently seen in ER on 05/18/2022 prompting this visit with me.  Patient states that he woke up on that day in the morning feeling dizzy and lightheaded.  He called his brother at 75 5 in the morning stating that he was feeling fine.   Patient went to take a shower and suddenly became dizzy and lightheaded.  He felt unsteady on his feet.  He denied any headache, blurred vision, double vision or vertigo.  He mated to the bedroom and lay on the bed.  He called his brother again and his ex-wife canceling his plans of going out for the day.  His brother convinced him to go to the emergency room for evaluation.  Patient denied any bleeding or severe diarrhea or dehydration and stated he had taken his medications as prescribed.  His blood pressure was 129/86 there is unclear if orthostatic vital signs were checked.  Hematocrit was stable at 34.  CMP was significant only for mildly elevated creatinine of 1.5.  CBC was normal.  MRI scan of the brain was obtained which showed no acute abnormality only changes of chronic small vessel disease.  MRI of the brain was motion degraded did not show any large vessel stenosis.  Patient was discharged home and stated that his lightheadedness went away after 4 to 6 hours.  He has not had any recurrent lightheadedness or dizziness since then.  Patient continues to have mild short-term memory and cognitive difficulties which appear to be stable and unchanged.  He has not been doing any mentally challenging activities which I had instructed at last visit.  He does get a little disoriented at times and today had trouble of coming to my office and had to use a GPS.  Continues to have mild upper extremity action tremors which are unchanged and seem to be well-controlled on the current medication regimen of Mysoline.  He remains on multiple medications for blood pressure which has been running on the low side and he plans to discuss this with his primary care physician whether he can come off some of these medicines. ROS:   14 system review of systems is positive for tremor, memory loss  , snoring and all other systems negative  PMH:  Past Medical History:  Diagnosis Date   (HFpEF) heart failure with preserved  ejection fraction (HCC)    Allergy    Anxiety    Arthritis    Chronic kidney disease    Depression    Frequent urination    GERD (gastroesophageal reflux disease)    Headache    mirgaines in the past   Herpes    History of nuclear stress test    Myoview 05/2021: EF 77, normal perfusion, low risk   Hyperlipidemia    Hypertension    IBS (irritable bowel syndrome)    Memory loss    Obesity    Stroke (HCC)    Urgency of urination     Social History:  Social History   Socioeconomic History   Marital status: Divorced    Spouse name: Not on file   Number of children: 1   Years of education: Masters   Highest education level: Not on file  Occupational History   Occupation: retired    Associate Professor: RETIRED  Tobacco Use   Smoking status: Former   Smokeless tobacco: Never  Advertising account planner   Vaping status: Never Used  Substance and Sexual Activity   Alcohol use: No    Alcohol/week: 1.0 standard drink of alcohol    Types: 1 Cans of beer per week   Drug use: No   Sexual activity: Never  Other Topics Concern   Not on file  Social History Narrative   Patient lives in Independent living @ [facility]   Patient is right-handed.   Patient does not drink any caffeine.   Social Determinants of Health   Financial Resource Strain: Not on file  Food Insecurity: No Food Insecurity (11/15/2021)   Hunger Vital Sign    Worried About Running Out of Food in the Last Year: Never true    Ran Out of Food in the Last Year: Never true  Transportation Needs: No Transportation Needs (11/15/2021)   PRAPARE - Administrator, Civil Service (Medical): No    Lack of Transportation (Non-Medical): No  Physical Activity: Not on file  Stress: Not on file  Social Connections: Not on file  Intimate Partner Violence: Not At Risk (11/15/2021)   Humiliation, Afraid, Rape, and Kick questionnaire    Fear of Current or Ex-Partner: No    Emotionally Abused: No    Physically Abused: No  Sexually Abused: No    Medications:   Current Outpatient Medications on File Prior to Visit  Medication Sig Dispense Refill   acetaminophen (TYLENOL) 500 MG tablet Take 1-2 tablets (500-1,000 mg total) by mouth every 6 (six) hours as needed for moderate pain. 30 tablet 0   acidophilus (RISAQUAD) CAPS capsule Take by mouth daily.     allopurinol (ZYLOPRIM) 100 MG tablet Take 50 mg by mouth.     amLODipine (NORVASC) 5 MG tablet Take 1 tablet (5 mg total) by mouth daily. 90 tablet 3   busPIRone (BUSPAR) 30 MG tablet Take 30 mg by mouth 2 (two) times daily.     candesartan (ATACAND) 32 MG tablet Take 32 mg by mouth daily.     cetirizine (ZYRTEC ALLERGY) 10 MG tablet Take 1 tablet (10 mg total) by mouth daily. (Patient taking differently: Take 10 mg by mouth at bedtime.) 30 tablet 0   clopidogrel (PLAVIX) 75 MG tablet Take 75 mg by mouth daily.     fluticasone (FLONASE) 50 MCG/ACT nasal spray Place 2 sprays into both nostrils daily. 16 g 12   folic acid (FOLVITE) 1 MG tablet Take 1 mg by mouth daily.     furosemide (LASIX) 40 MG tablet Take 40 mg by mouth in the morning.     hydrALAZINE (APRESOLINE) 50 MG tablet Take 1 tablet (50 mg total) by mouth 2 (two) times daily. 270 tablet 3   hydroxychloroquine (PLAQUENIL) 200 MG tablet Take 400 mg by mouth at bedtime.     lidocaine (LIDODERM) 5 % Place 1 patch onto the skin daily. Remove & Discard patch within 12 hours or as directed by MD     metoprolol succinate (TOPROL XL) 25 MG 24 hr tablet Take 1 tablet (25 mg total) by mouth daily. (Patient taking differently: Take 50 mg by mouth daily.) 90 tablet 3   PARoxetine (PAXIL-CR) 37.5 MG 24 hr tablet Take 1 tablet (37.5 mg total) by mouth at bedtime. (Patient taking differently: Take 37.5 mg by mouth daily.) 30 tablet 3   primidone (MYSOLINE) 250 MG tablet TAKE 1 TABLET BY MOUTH TWICE A DAY 180 tablet 0   QUEtiapine (SEROQUEL) 25 MG tablet Take 25 mg by mouth at bedtime.     rosuvastatin (CRESTOR) 40 MG  tablet Take 40 mg by mouth daily.     sildenafil (VIAGRA) 50 MG tablet Take 100 mg by mouth as needed for erectile dysfunction.     spironolactone (ALDACTONE) 25 MG tablet Take 25 mg by mouth daily.     Suvorexant (BELSOMRA) 10 MG TABS Take 1 tablet by mouth at bedtime. 30 tablet 2   traZODone (DESYREL) 150 MG tablet Take 1 tablet (150 mg total) by mouth at bedtime as needed. for sleep (Patient taking differently: Take 150 mg by mouth as needed. for sleep) 30 tablet 3   valACYclovir (VALTREX) 1000 MG tablet Take 1,000 mg by mouth daily.     Vitamin D, Ergocalciferol, (DRISDOL) 1.25 MG (50000 UNIT) CAPS capsule Take 50,000 Units by mouth every Saturday.     No current facility-administered medications on file prior to visit.    Allergies:   Allergies  Allergen Reactions   Amitriptyline Other (See Comments)   Aspirin Other (See Comments)    Dizziness, nausea and vomiting Balance issues   Lisinopril Other (See Comments)    Critical Cough   Lithium Diarrhea    Critical Per pt reaction unknown   Morphine Other (See Comments)   Nsaids Other (See  Comments)   Oxycodone Nausea And Vomiting   Oxycodone Hcl Other (See Comments)   Oxycodone-Acetaminophen Nausea And Vomiting and Other (See Comments)    Also causes dizziness   Pork Allergy Other (See Comments)   Pork-Derived Products Other (See Comments)    Unspecified reaction    Physical Exam General: Obese middle-aged African-American male, seated, in no evident distress Head: head normocephalic and atraumatic.   Neck: supple with no carotid or supraclavicular bruits Cardiovascular: regular rate and rhythm, no murmurs Musculoskeletal: no deformity Skin:  no rash/petichiae Vascular:  Normal pulses all extremities  Neurologic Exam Mental Status: Awake and fully alert. Oriented to place and time. Recent and remote memory intact. Attention span, concentration and fund of knowledge appropriate. Mood and affect appropriate.  Diminished  recall 2/3.  Able to name 10 animals which can walk on 4 legs.  Clock drawing 4/4. Cranial Nerves: Fundoscopic exam not done. Pupils equal, briskly reactive to light. Extraocular movements full without nystagmus. Visual fields full to confrontation. Hearing intact. Facial sensation intact. Face, tongue, palate moves normally and symmetrically.  Motor: Normal bulk and tone. Normal strength in all tested extremity muscles.  Mild left grip weakness.  Diminished fine finger movements on the left.  Orbits right over left upper extremity.  Mild action tremor of upper extremities left greater than right.  Absent at rest. Sensory.: intact to touch , pinprick , position and vibratory sensation.  Coordination: Rapid alternating movements normal in all extremities. Finger-to-nose and heel-to-shin performed accurately bilaterally. Gait and Station: Arises from chair without difficulty. Stance is normal. Gait demonstrates normal stride length and balance . Able to heel, toe and tandem walk with mild difficulty.  Reflexes: 1+ and symmetric. Toes downgoing.        05/18/2022    3:08 PM 01/09/2020    7:28 AM 03/25/2019   11:18 AM 09/12/2018   10:12 AM 03/12/2018   10:17 AM 09/26/2017    8:36 AM 06/14/2017   10:09 AM  MMSE - Mini Mental State Exam  Not completed:   --      Orientation to time 5 4 3 3 4 3 3   Orientation to Place 5 5 4 4 5 5 5   Registration 3 3 3 3 3 3 3   Attention/ Calculation 5 3 2 2 5 5 5   Recall 0 3 3 3 2 3 2   Language- name 2 objects 2 2 2 2 2 2 2   Language- repeat 1 1 1 1 1 1 1   Language- follow 3 step command 3 3 3 3 3 3 3   Language- read & follow direction 1 1 1 1 1 1 1   Write a sentence 1 1 1 1 1 1 1   Copy design 1 1 1 1 1 1 1   Copy design-comments    named 3 animals     Total score 27 27 24 24 28 28 27       ASSESSMENT: 72 year old African-American male with new onset daily headaches in 2022 of unclear etiology which seems to have responded to gabapentin  and have resolved...   Remote history of right brain subcortical infarct in 2011 with multiple vascular risk factors of obesity, hypertension hyperlipidemia and suspected sleep apnea Longstanding history of benign essential tremor which appears stable.  Subjective memory difficulties daily due to likely mild age-related cognitive impairment. New complaints of transient lightheadedness related to change in posture likely orthostasis.   PLAN: I had a long discussion with the patient and  his daughter  whom I spoke to over the phone  regarding his recent episode of lightheadedness which may have been related to orthostasis due to his multiple blood pressure medications ,remote stroke, mild cognitive impairment, essential tremors all of which appears quite stable.  He is doing well with neurovascular standpoint without recurrent stroke or TIA symptoms.  Continue Plavix for stroke prevention with aggressive risk factor modification strict control of hypertension blood pressure goal below 130/90 and lipids with LDL cholesterol goal below 70 mg percent.  Continue Mysoline 250 mg twice daily for his essential tremor which appears to be quite well controlled and is not functionally disabling.  He also has mild memory difficulties due to age-related mild cognitive impairment.  I recommend increase participation in cognitively challenging activities like solving crossword puzzles, playing bridge and sudoku.  We also discussed memory compensation strategies.  I advised him to see his primary physician Dr. Yetta Barre to discuss adjustment of his blood pressure medications.  I also advised him to do orthostatic tolerance exercises.  He will return for follow-up in the future in 6 months or call earlier if necessary. Greater than 50% time during this 40-minute visit were spent in counseling and coordination of care about his new onset headaches as well as remote history of stroke and discussion about stroke prevention and answering questions Delia Heady, MD Note: This document was prepared with digital dictation and possible smart phrase technology. Any transcriptional errors that result from this process are unintentional.

## 2022-09-21 NOTE — Patient Instructions (Signed)
I had a long discussion with the patient and  his daughter whom I spoke to over the phone  regarding his recent episode of lightheadedness which may have been related to orthostasis due to his multiple blood pressure medications ,remote stroke, mild cognitive impairment, essential tremors all of which appears quite stable.  He is doing well with neurovascular standpoint without recurrent stroke or TIA symptoms.  Continue Plavix for stroke prevention with aggressive risk factor modification strict control of hypertension blood pressure goal below 130/90 and lipids with LDL cholesterol goal below 70 mg percent.  Continue Mysoline 250 mg twice daily for his essential tremor which appears to be quite well controlled and is not functionally disabling.  He also has mild memory difficulties due to age-related mild cognitive impairment.  I recommend increase participation in cognitively challenging activities like solving crossword puzzles, playing bridge and sudoku.  We also discussed memory compensation strategies.  I advised him to see his primary physician Dr. Yetta Barre to discuss adjustment of his blood pressure medications.  I also advised him to do orthostatic tolerance exercises.  He will return for follow-up in the future in 6 months or call earlier if necessary.  Memory Compensation Strategies  Use "WARM" strategy.  W= write it down  A= associate it  R= repeat it  M= make a mental note  2.   You can keep a Glass blower/designer.  Use a 3-ring notebook with sections for the following: calendar, important names and phone numbers,  medications, doctors' names/phone numbers, lists/reminders, and a section to journal what you did  each day.   3.    Use a calendar to write appointments down.  4.    Write yourself a schedule for the day.  This can be placed on the calendar or in a separate section of the Memory Notebook.  Keeping a  regular schedule can help memory.  5.    Use medication organizer with sections  for each day or morning/evening pills.  You may need help loading it  6.    Keep a basket, or pegboard by the door.  Place items that you need to take out with you in the basket or on the pegboard.  You may also want to  include a message board for reminders.  7.    Use sticky notes.  Place sticky notes with reminders in a place where the task is performed.  For example: " turn off the  stove" placed by the stove, "lock the door" placed on the door at eye level, " take your medications" on  the bathroom mirror or by the place where you normally take your medications.  8.    Use alarms/timers.  Use while cooking to remind yourself to check on food or as a reminder to take your medicine, or as a  reminder to make a call, or as a reminder to perform another task, etc.

## 2022-09-29 DIAGNOSIS — Z96651 Presence of right artificial knee joint: Secondary | ICD-10-CM | POA: Diagnosis not present

## 2022-10-04 DIAGNOSIS — F411 Generalized anxiety disorder: Secondary | ICD-10-CM | POA: Diagnosis not present

## 2022-10-04 DIAGNOSIS — F331 Major depressive disorder, recurrent, moderate: Secondary | ICD-10-CM | POA: Diagnosis not present

## 2022-10-07 DIAGNOSIS — F331 Major depressive disorder, recurrent, moderate: Secondary | ICD-10-CM | POA: Diagnosis not present

## 2022-10-07 DIAGNOSIS — F411 Generalized anxiety disorder: Secondary | ICD-10-CM | POA: Diagnosis not present

## 2022-10-17 DIAGNOSIS — E119 Type 2 diabetes mellitus without complications: Secondary | ICD-10-CM | POA: Diagnosis not present

## 2022-10-18 DIAGNOSIS — N529 Male erectile dysfunction, unspecified: Secondary | ICD-10-CM | POA: Diagnosis not present

## 2022-10-18 DIAGNOSIS — A6002 Herpesviral infection of other male genital organs: Secondary | ICD-10-CM | POA: Diagnosis not present

## 2022-10-18 DIAGNOSIS — N1832 Chronic kidney disease, stage 3b: Secondary | ICD-10-CM | POA: Diagnosis not present

## 2022-10-18 DIAGNOSIS — M79641 Pain in right hand: Secondary | ICD-10-CM | POA: Diagnosis not present

## 2022-10-18 DIAGNOSIS — I10 Essential (primary) hypertension: Secondary | ICD-10-CM | POA: Diagnosis not present

## 2022-10-18 DIAGNOSIS — Z6832 Body mass index (BMI) 32.0-32.9, adult: Secondary | ICD-10-CM | POA: Diagnosis not present

## 2022-10-18 DIAGNOSIS — R42 Dizziness and giddiness: Secondary | ICD-10-CM | POA: Diagnosis not present

## 2022-10-18 DIAGNOSIS — R030 Elevated blood-pressure reading, without diagnosis of hypertension: Secondary | ICD-10-CM | POA: Diagnosis not present

## 2022-10-18 DIAGNOSIS — E559 Vitamin D deficiency, unspecified: Secondary | ICD-10-CM | POA: Diagnosis not present

## 2022-10-18 DIAGNOSIS — E7800 Pure hypercholesterolemia, unspecified: Secondary | ICD-10-CM | POA: Diagnosis not present

## 2022-10-18 DIAGNOSIS — M79642 Pain in left hand: Secondary | ICD-10-CM | POA: Diagnosis not present

## 2022-10-18 DIAGNOSIS — R7303 Prediabetes: Secondary | ICD-10-CM | POA: Diagnosis not present

## 2022-10-19 DIAGNOSIS — E7800 Pure hypercholesterolemia, unspecified: Secondary | ICD-10-CM | POA: Diagnosis not present

## 2022-10-19 DIAGNOSIS — E559 Vitamin D deficiency, unspecified: Secondary | ICD-10-CM | POA: Diagnosis not present

## 2022-10-19 DIAGNOSIS — M79641 Pain in right hand: Secondary | ICD-10-CM | POA: Diagnosis not present

## 2022-10-19 DIAGNOSIS — I10 Essential (primary) hypertension: Secondary | ICD-10-CM | POA: Diagnosis not present

## 2022-10-19 DIAGNOSIS — M79642 Pain in left hand: Secondary | ICD-10-CM | POA: Diagnosis not present

## 2022-10-19 DIAGNOSIS — N1832 Chronic kidney disease, stage 3b: Secondary | ICD-10-CM | POA: Diagnosis not present

## 2022-10-19 DIAGNOSIS — R5383 Other fatigue: Secondary | ICD-10-CM | POA: Diagnosis not present

## 2022-10-19 DIAGNOSIS — N529 Male erectile dysfunction, unspecified: Secondary | ICD-10-CM | POA: Diagnosis not present

## 2022-10-19 DIAGNOSIS — Z113 Encounter for screening for infections with a predominantly sexual mode of transmission: Secondary | ICD-10-CM | POA: Diagnosis not present

## 2022-10-19 DIAGNOSIS — Z125 Encounter for screening for malignant neoplasm of prostate: Secondary | ICD-10-CM | POA: Diagnosis not present

## 2022-10-19 DIAGNOSIS — R7303 Prediabetes: Secondary | ICD-10-CM | POA: Diagnosis not present

## 2022-10-19 DIAGNOSIS — A6002 Herpesviral infection of other male genital organs: Secondary | ICD-10-CM | POA: Diagnosis not present

## 2022-10-19 DIAGNOSIS — M109 Gout, unspecified: Secondary | ICD-10-CM | POA: Diagnosis not present

## 2022-10-19 DIAGNOSIS — Z6832 Body mass index (BMI) 32.0-32.9, adult: Secondary | ICD-10-CM | POA: Diagnosis not present

## 2022-10-19 DIAGNOSIS — R0602 Shortness of breath: Secondary | ICD-10-CM | POA: Diagnosis not present

## 2022-10-19 DIAGNOSIS — Z0000 Encounter for general adult medical examination without abnormal findings: Secondary | ICD-10-CM | POA: Diagnosis not present

## 2022-10-27 DIAGNOSIS — M79641 Pain in right hand: Secondary | ICD-10-CM | POA: Diagnosis not present

## 2022-10-27 DIAGNOSIS — E213 Hyperparathyroidism, unspecified: Secondary | ICD-10-CM | POA: Diagnosis not present

## 2022-10-27 DIAGNOSIS — M79642 Pain in left hand: Secondary | ICD-10-CM | POA: Diagnosis not present

## 2022-10-27 DIAGNOSIS — I10 Essential (primary) hypertension: Secondary | ICD-10-CM | POA: Diagnosis not present

## 2022-10-27 DIAGNOSIS — N529 Male erectile dysfunction, unspecified: Secondary | ICD-10-CM | POA: Diagnosis not present

## 2022-10-27 DIAGNOSIS — E7800 Pure hypercholesterolemia, unspecified: Secondary | ICD-10-CM | POA: Diagnosis not present

## 2022-10-27 DIAGNOSIS — J302 Other seasonal allergic rhinitis: Secondary | ICD-10-CM | POA: Diagnosis not present

## 2022-10-27 DIAGNOSIS — R42 Dizziness and giddiness: Secondary | ICD-10-CM | POA: Diagnosis not present

## 2022-10-27 DIAGNOSIS — R1312 Dysphagia, oropharyngeal phase: Secondary | ICD-10-CM | POA: Diagnosis not present

## 2022-10-27 DIAGNOSIS — R7303 Prediabetes: Secondary | ICD-10-CM | POA: Diagnosis not present

## 2022-10-27 DIAGNOSIS — A6002 Herpesviral infection of other male genital organs: Secondary | ICD-10-CM | POA: Diagnosis not present

## 2022-10-27 DIAGNOSIS — N1832 Chronic kidney disease, stage 3b: Secondary | ICD-10-CM | POA: Diagnosis not present

## 2022-10-28 DIAGNOSIS — F331 Major depressive disorder, recurrent, moderate: Secondary | ICD-10-CM | POA: Diagnosis not present

## 2022-10-28 DIAGNOSIS — F411 Generalized anxiety disorder: Secondary | ICD-10-CM | POA: Diagnosis not present

## 2022-11-03 DIAGNOSIS — E213 Hyperparathyroidism, unspecified: Secondary | ICD-10-CM | POA: Diagnosis not present

## 2022-11-11 ENCOUNTER — Other Ambulatory Visit (HOSPITAL_COMMUNITY): Payer: Self-pay | Admitting: Orthopedic Surgery

## 2022-11-11 DIAGNOSIS — Z96651 Presence of right artificial knee joint: Secondary | ICD-10-CM | POA: Diagnosis not present

## 2022-11-11 DIAGNOSIS — F332 Major depressive disorder, recurrent severe without psychotic features: Secondary | ICD-10-CM | POA: Diagnosis not present

## 2022-11-11 DIAGNOSIS — Z471 Aftercare following joint replacement surgery: Secondary | ICD-10-CM | POA: Diagnosis not present

## 2022-11-16 ENCOUNTER — Other Ambulatory Visit: Payer: Self-pay | Admitting: Neurology

## 2022-11-16 DIAGNOSIS — G250 Essential tremor: Secondary | ICD-10-CM

## 2022-11-17 DIAGNOSIS — Z79899 Other long term (current) drug therapy: Secondary | ICD-10-CM | POA: Diagnosis not present

## 2022-11-18 ENCOUNTER — Inpatient Hospital Stay: Payer: Medicare Other | Attending: Nurse Practitioner

## 2022-11-18 DIAGNOSIS — D472 Monoclonal gammopathy: Secondary | ICD-10-CM | POA: Insufficient documentation

## 2022-11-18 LAB — CBC WITH DIFFERENTIAL (CANCER CENTER ONLY)
Abs Immature Granulocytes: 0.02 10*3/uL (ref 0.00–0.07)
Basophils Absolute: 0 10*3/uL (ref 0.0–0.1)
Basophils Relative: 1 %
Eosinophils Absolute: 0.1 10*3/uL (ref 0.0–0.5)
Eosinophils Relative: 2 %
HCT: 34.2 % — ABNORMAL LOW (ref 39.0–52.0)
Hemoglobin: 11.9 g/dL — ABNORMAL LOW (ref 13.0–17.0)
Immature Granulocytes: 0 %
Lymphocytes Relative: 27 %
Lymphs Abs: 1.5 10*3/uL (ref 0.7–4.0)
MCH: 33.2 pg (ref 26.0–34.0)
MCHC: 34.8 g/dL (ref 30.0–36.0)
MCV: 95.5 fL (ref 80.0–100.0)
Monocytes Absolute: 0.4 10*3/uL (ref 0.1–1.0)
Monocytes Relative: 8 %
Neutro Abs: 3.4 10*3/uL (ref 1.7–7.7)
Neutrophils Relative %: 62 %
Platelet Count: 301 10*3/uL (ref 150–400)
RBC: 3.58 MIL/uL — ABNORMAL LOW (ref 4.22–5.81)
RDW: 12.1 % (ref 11.5–15.5)
WBC Count: 5.5 10*3/uL (ref 4.0–10.5)
nRBC: 0 % (ref 0.0–0.2)

## 2022-11-18 LAB — CMP (CANCER CENTER ONLY)
ALT: 16 U/L (ref 0–44)
AST: 20 U/L (ref 15–41)
Albumin: 4.3 g/dL (ref 3.5–5.0)
Alkaline Phosphatase: 47 U/L (ref 38–126)
Anion gap: 5 (ref 5–15)
BUN: 19 mg/dL (ref 8–23)
CO2: 27 mmol/L (ref 22–32)
Calcium: 9.1 mg/dL (ref 8.9–10.3)
Chloride: 103 mmol/L (ref 98–111)
Creatinine: 1.43 mg/dL — ABNORMAL HIGH (ref 0.61–1.24)
GFR, Estimated: 53 mL/min — ABNORMAL LOW (ref >60)
Glucose, Bld: 94 mg/dL (ref 70–99)
Potassium: 4.8 mmol/L (ref 3.5–5.1)
Sodium: 135 mmol/L (ref 135–145)
Total Bilirubin: 0.3 mg/dL (ref 0.3–1.2)
Total Protein: 7.5 g/dL (ref 6.5–8.1)

## 2022-11-21 DIAGNOSIS — M109 Gout, unspecified: Secondary | ICD-10-CM | POA: Diagnosis not present

## 2022-11-21 LAB — KAPPA/LAMBDA LIGHT CHAINS
Kappa free light chain: 30 mg/L — ABNORMAL HIGH (ref 3.3–19.4)
Kappa, lambda light chain ratio: 1.71 — ABNORMAL HIGH (ref 0.26–1.65)
Lambda free light chains: 17.5 mg/L (ref 5.7–26.3)

## 2022-11-22 LAB — MULTIPLE MYELOMA PANEL, SERUM
Albumin SerPl Elph-Mcnc: 3.9 g/dL (ref 2.9–4.4)
Albumin/Glob SerPl: 1.3 (ref 0.7–1.7)
Alpha 1: 0.2 g/dL (ref 0.0–0.4)
Alpha2 Glob SerPl Elph-Mcnc: 0.5 g/dL (ref 0.4–1.0)
B-Globulin SerPl Elph-Mcnc: 0.9 g/dL (ref 0.7–1.3)
Gamma Glob SerPl Elph-Mcnc: 1.4 g/dL (ref 0.4–1.8)
Globulin, Total: 3.1 g/dL (ref 2.2–3.9)
IgA: 111 mg/dL (ref 61–437)
IgG (Immunoglobin G), Serum: 1538 mg/dL (ref 603–1613)
IgM (Immunoglobulin M), Srm: 112 mg/dL (ref 20–172)
M Protein SerPl Elph-Mcnc: 1.1 g/dL — ABNORMAL HIGH
Total Protein ELP: 7 g/dL (ref 6.0–8.5)

## 2022-11-23 ENCOUNTER — Ambulatory Visit (HOSPITAL_COMMUNITY)
Admission: RE | Admit: 2022-11-23 | Discharge: 2022-11-23 | Disposition: A | Discharge status: Home / Self Care | Payer: Medicare Other | Source: Ambulatory Visit | Attending: Orthopedic Surgery | Admitting: Orthopedic Surgery

## 2022-11-23 ENCOUNTER — Encounter (HOSPITAL_COMMUNITY)
Admission: RE | Admit: 2022-11-23 | Discharge: 2022-11-23 | Disposition: A | Discharge status: Home / Self Care | Payer: Medicare Other | Source: Ambulatory Visit | Attending: Orthopedic Surgery | Admitting: Orthopedic Surgery

## 2022-11-23 DIAGNOSIS — Z471 Aftercare following joint replacement surgery: Secondary | ICD-10-CM | POA: Diagnosis not present

## 2022-11-23 DIAGNOSIS — Z96651 Presence of right artificial knee joint: Secondary | ICD-10-CM | POA: Diagnosis not present

## 2022-11-23 DIAGNOSIS — M25561 Pain in right knee: Secondary | ICD-10-CM | POA: Diagnosis not present

## 2022-11-23 MED ORDER — TECHNETIUM TC 99M MEDRONATE IV KIT
20.0000 | PACK | Freq: Once | INTRAVENOUS | Status: AC | PRN
  Administered 2022-11-23: 20 via INTRAVENOUS

## 2022-11-25 DIAGNOSIS — F411 Generalized anxiety disorder: Secondary | ICD-10-CM | POA: Diagnosis not present

## 2022-11-25 DIAGNOSIS — F331 Major depressive disorder, recurrent, moderate: Secondary | ICD-10-CM | POA: Diagnosis not present

## 2022-11-29 ENCOUNTER — Encounter: Payer: Self-pay | Admitting: Neurology

## 2022-11-29 ENCOUNTER — Inpatient Hospital Stay: Payer: Medicare Other | Admitting: Nurse Practitioner

## 2022-11-29 DIAGNOSIS — D472 Monoclonal gammopathy: Secondary | ICD-10-CM | POA: Insufficient documentation

## 2022-11-29 NOTE — Assessment & Plan Note (Deleted)
IgG MGUS, low risk -He had abnormal SPEP with elevated M spike 0.9 and kappa/lambda light chain ratio 1.7 in 03/2022, referred here by nephrologist  -Other labs show Scr 1.5, otherwise normal CMP and mild anemia hgb 12.4 (improving since ortho surgery in 10/2021). No evidence of end organ involvement -Seen by [place] 04/27/22, work up showed IgG specific MGUS, low risk, and negative bone survey. He declined 24 urine for UPEP/light chains, it will hinder his activity/involvement at his residence which may worsen isolation/anxiety/depression. He was encouraged to consider doing this at a later time if he can -the work up was reviewed and nature of MGUS with pt and his daughter today on the phone. This is a benign condition, but with risk of evolving to multiple myeloma at ~1% per year.  -We recommend observation, with SPEP w IFE and light chain labs q6-12 months and annual f/up. -His daughter would like PCP or nephrology to follow, so he does not need as many specialist appointments. Will ask if they are comfortable to follow -if PCP/nephrology agree, we will see him back as needed in the future, if he has significant M spike increase, CRAB criteria, or new cytopenias.  -Pt and daughter agree with the plan and appreciate the call.

## 2022-11-29 NOTE — Progress Notes (Deleted)
Patient Care Team: Christopher Royalty, MD as PCP - General (Family Medicine) Christopher Constant, MD as PCP - Cardiology (Cardiology) Christopher Millin, MD (Psychiatry) Christopher Buttner, MD as Consulting Physician (Nephrology)  Clinic Day:  11/29/2022  Referring physician: Knox Royalty, MD  ASSESSMENT & PLAN:   Assessment & Plan: MGUS (monoclonal gammopathy of unknown significance) IgG MGUS, low risk -He had abnormal SPEP with elevated M spike 0.9 and kappa/lambda light chain ratio 1.7 in 03/2022, referred here by nephrologist  -Other labs show Scr 1.5, otherwise normal CMP and mild anemia hgb 12.4 (improving since ortho surgery in 10/2021). No evidence of end organ involvement -Seen by [place] 04/27/22, work up showed IgG specific MGUS, low risk, and negative bone survey. He declined 24 urine for UPEP/light chains, it will hinder his activity/involvement at his residence which may worsen isolation/anxiety/depression. He was encouraged to consider doing this at a later time if he can -the work up was reviewed and nature of MGUS with pt and his daughter today on the phone. This is a benign condition, but with risk of evolving to multiple myeloma at ~1% per year.  -We recommend observation, with SPEP w IFE and light chain labs q6-12 months and annual f/up. -His daughter would like PCP or nephrology to follow, so he does not need as many specialist appointments. Will ask if they are comfortable to follow -if PCP/nephrology agree, we will see him back as needed in the future, if he has significant M spike increase, CRAB criteria, or new cytopenias.  -Pt and daughter agree with the plan and appreciate the call.     The patient understands the plans discussed today and is in agreement with them.  He knows to contact our office if he develops concerns prior to his next appointment.  I provided *** minutes of face-to-face time during this encounter and > 50% was spent counseling as documented  under my assessment and plan.    Christopher Jews, NP  [facility] - A DEPT OF [facility] [address] [place] [address] Dept: [phone] Dept Fax: [phone]   No orders of the defined types were placed in this encounter.     CHIEF COMPLAINT:  CC: MGUS   Current Treatment:  surveillance  INTERVAL HISTORY:  [name] is here today for repeat clinical assessment. He denies fevers or chills. He denies pain. His appetite is good. His weight {Weight change:10426}.  I have reviewed the past medical history, past surgical history, social history and family history with the patient and they are unchanged from previous note.  ALLERGIES:  is allergic to amitriptyline, aspirin, lisinopril, lithium, morphine, nsaids, oxycodone, oxycodone hcl, oxycodone-acetaminophen, pork allergy, and pork-derived products.  MEDICATIONS:  Current Outpatient Medications  Medication Sig Dispense Refill   acetaminophen (TYLENOL) 500 MG tablet Take 1-2 tablets (500-1,000 mg total) by mouth every 6 (six) hours as needed for moderate pain. 30 tablet 0   acidophilus (RISAQUAD) CAPS capsule Take by mouth daily.     allopurinol (ZYLOPRIM) 100 MG tablet Take 50 mg by mouth.     amLODipine (NORVASC) 5 MG tablet Take 1 tablet (5 mg total) by mouth daily. 90 tablet 3   busPIRone (BUSPAR) 30 MG tablet Take 30 mg by mouth 2 (two) times daily.     candesartan (ATACAND) 32 MG tablet Take 32 mg by mouth daily.     cetirizine (ZYRTEC ALLERGY) 10 MG tablet Take 1 tablet (10  mg total) by mouth daily. (Patient taking differently: Take 10 mg by mouth at bedtime.) 30 tablet 0   clopidogrel (PLAVIX) 75 MG tablet Take 75 mg by mouth daily.     fluticasone (FLONASE) 50 MCG/ACT nasal spray Place 2 sprays into both nostrils daily. 16 g 12   folic acid (FOLVITE) 1 MG tablet Take 1 mg by mouth daily.     furosemide (LASIX) 40 MG tablet Take 40 mg by mouth in the  morning.     hydrALAZINE (APRESOLINE) 50 MG tablet Take 1 tablet (50 mg total) by mouth 2 (two) times daily. 270 tablet 3   hydroxychloroquine (PLAQUENIL) 200 MG tablet Take 400 mg by mouth at bedtime.     lidocaine (LIDODERM) 5 % Place 1 patch onto the skin daily. Remove & Discard patch within 12 hours or as directed by MD     metoprolol succinate (TOPROL XL) 25 MG 24 hr tablet Take 1 tablet (25 mg total) by mouth daily. (Patient taking differently: Take 50 mg by mouth daily.) 90 tablet 3   PARoxetine (PAXIL-CR) 37.5 MG 24 hr tablet Take 1 tablet (37.5 mg total) by mouth at bedtime. (Patient taking differently: Take 37.5 mg by mouth daily.) 30 tablet 3   primidone (MYSOLINE) 250 MG tablet TAKE 1 TABLET BY MOUTH TWICE A DAY 60 tablet 0   QUEtiapine (SEROQUEL) 25 MG tablet Take 25 mg by mouth at bedtime.     rosuvastatin (CRESTOR) 40 MG tablet Take 40 mg by mouth daily.     sildenafil (VIAGRA) 50 MG tablet Take 100 mg by mouth as needed for erectile dysfunction.     spironolactone (ALDACTONE) 25 MG tablet Take 25 mg by mouth daily.     Suvorexant (BELSOMRA) 10 MG TABS Take 1 tablet by mouth at bedtime. 30 tablet 2   traZODone (DESYREL) 150 MG tablet Take 1 tablet (150 mg total) by mouth at bedtime as needed. for sleep (Patient taking differently: Take 150 mg by mouth as needed. for sleep) 30 tablet 3   valACYclovir (VALTREX) 1000 MG tablet Take 1,000 mg by mouth daily.     Vitamin D, Ergocalciferol, (DRISDOL) 1.25 MG (50000 UNIT) CAPS capsule Take 50,000 Units by mouth every Saturday.     No current facility-administered medications for this visit.    HISTORY OF PRESENT ILLNESS:   Oncology History   No history exists.      REVIEW OF SYSTEMS:   Constitutional: Denies fevers, chills or abnormal weight loss Eyes: Denies blurriness of vision Ears, nose, mouth, throat, and face: Denies mucositis or sore throat Respiratory: Denies cough, dyspnea or wheezes Cardiovascular: Denies palpitation,  chest discomfort or lower extremity swelling Gastrointestinal:  Denies nausea, heartburn or change in bowel habits Skin: Denies abnormal skin rashes Lymphatics: Denies new lymphadenopathy or easy bruising Neurological:Denies numbness, tingling or new weaknesses Behavioral/Psych: Mood is stable, no new changes  All other systems were reviewed with the patient and are negative.   VITALS:  There were no vitals taken for this visit.  Wt Readings from Last 3 Encounters:  09/21/22 198 lb (89.8 kg)  09/11/22 189 lb 9.5 oz (86 kg)  05/18/22 191 lb (86.6 kg)    There is no height or weight on file to calculate BMI.  Performance status (ECOG): {CHL ONC [id number]  PHYSICAL EXAM:   GENERAL:alert, no distress and comfortable SKIN: skin color, texture, turgor are normal, no rashes or significant lesions EYES: normal, Conjunctiva are pink and non-injected, sclera clear OROPHARYNX:no exudate,  no erythema and lips, buccal mucosa, and tongue normal  NECK: supple, thyroid normal size, non-tender, without nodularity LYMPH:  no palpable lymphadenopathy in the cervical, axillary or inguinal LUNGS: clear to auscultation and percussion with normal breathing effort HEART: regular rate & rhythm and no murmurs and no lower extremity edema ABDOMEN:abdomen soft, non-tender and normal bowel sounds Musculoskeletal:no cyanosis of digits and no clubbing  NEURO: alert & oriented x 3 with fluent speech, no focal motor/sensory deficits  LABORATORY DATA:  I have reviewed the data as listed    Component Value Date/Time   NA 135 11/18/2022 1112   NA 140 04/09/2021 1401   K 4.8 11/18/2022 1112   CL 103 11/18/2022 1112   CO2 27 11/18/2022 1112   GLUCOSE 94 11/18/2022 1112   BUN 19 11/18/2022 1112   BUN 9 04/09/2021 1401   CREATININE 1.43 (H) 11/18/2022 1112   CALCIUM 9.1 11/18/2022 1112   PROT 7.5 11/18/2022 1112   PROT 7.3 05/29/2014 1503   ALBUMIN 4.3 11/18/2022 1112   ALBUMIN 4.7 05/29/2014 1503    AST 20 11/18/2022 1112   ALT 16 11/18/2022 1112   ALKPHOS 47 11/18/2022 1112   BILITOT 0.3 11/18/2022 1112   GFRNONAA 53 (L) 11/18/2022 1112   GFRAA 84 02/12/2020 1022    No results found for: "SPEP", "UPEP"  Lab Results  Component Value Date   WBC 5.5 11/18/2022   NEUTROABS 3.4 11/18/2022   HGB 11.9 (L) 11/18/2022   HCT 34.2 (L) 11/18/2022   MCV 95.5 11/18/2022   PLT 301 11/18/2022      Chemistry      Component Value Date/Time   NA 135 11/18/2022 1112   NA 140 04/09/2021 1401   K 4.8 11/18/2022 1112   CL 103 11/18/2022 1112   CO2 27 11/18/2022 1112   BUN 19 11/18/2022 1112   BUN 9 04/09/2021 1401   CREATININE 1.43 (H) 11/18/2022 1112      Component Value Date/Time   CALCIUM 9.1 11/18/2022 1112   ALKPHOS 47 11/18/2022 1112   AST 20 11/18/2022 1112   ALT 16 11/18/2022 1112   BILITOT 0.3 11/18/2022 1112       RADIOGRAPHIC STUDIES: I have personally reviewed the radiological images as listed and agreed with the findings in the report. No results found.

## 2022-12-01 ENCOUNTER — Institutional Professional Consult (permissible substitution): Payer: Medicare Other | Admitting: Neurology

## 2022-12-01 DIAGNOSIS — F332 Major depressive disorder, recurrent severe without psychotic features: Secondary | ICD-10-CM | POA: Diagnosis not present

## 2022-12-04 ENCOUNTER — Ambulatory Visit
Admission: EM | Admit: 2022-12-04 | Discharge: 2022-12-04 | Disposition: A | Discharge status: Home / Self Care | Payer: Medicare Other | Attending: Internal Medicine | Admitting: Internal Medicine

## 2022-12-04 DIAGNOSIS — J029 Acute pharyngitis, unspecified: Secondary | ICD-10-CM | POA: Insufficient documentation

## 2022-12-04 DIAGNOSIS — J069 Acute upper respiratory infection, unspecified: Secondary | ICD-10-CM | POA: Diagnosis not present

## 2022-12-04 LAB — POC COVID19/FLU A&B COMBO
Covid Antigen, POC: NEGATIVE
Influenza A Antigen, POC: NEGATIVE
Influenza B Antigen, POC: NEGATIVE

## 2022-12-04 LAB — POCT RAPID STREP A (OFFICE): Rapid Strep A Screen: NEGATIVE

## 2022-12-04 MED ORDER — BENZONATATE 100 MG PO CAPS
100.0000 mg | ORAL_CAPSULE | Freq: Three times a day (TID) | ORAL | 0 refills | Status: AC
Start: 2022-12-04 — End: ?

## 2022-12-04 NOTE — ED Provider Notes (Signed)
[facility]    CSN: [id number] Arrival date & time: 12/04/22  1409      History   Chief Complaint No chief complaint on file.   HPI [name] is a 72 y.o. male  presents for evaluation of URI symptoms for 1 days. Patient reports associated symptoms of cough, congestion, sore throat, headache. Denies N/V/D, fevers, ear pain, body aches, shortness of breath. Patient does not have a hx of asthma. Patient does have a history of smoking.  Reports no sick contacts.  Pt has taken nothing OTC for symptoms. Pt has no other concerns at this time.   HPI  Past Medical History:  Diagnosis Date   (HFpEF) heart failure with preserved ejection fraction (HCC)    Allergy    Anxiety    Arthritis    Chronic kidney disease    Depression    Frequent urination    GERD (gastroesophageal reflux disease)    Headache    mirgaines in the past   Herpes    History of nuclear stress test    Myoview 05/2021: EF 77, normal perfusion, low risk   Hyperlipidemia    Hypertension    IBS (irritable bowel syndrome)    Memory loss    Obesity    Stroke [facility])    Urgency of urination     Patient Active Problem List   Diagnosis Date Noted   MGUS (monoclonal gammopathy of unknown significance) 11/29/2022   OA (osteoarthritis) of knee 11/15/2021   Primary osteoarthritis of right knee 11/15/2021   Mixed hyperlipidemia 09/24/2021   AKI (acute kidney injury) (HCC) 04/09/2021   Recurrent major depressive disorder, in full remission (HCC) 01/07/2020   Chronic heart failure with preserved ejection fraction (HCC) 01/02/2020   Essential hypertension 01/02/2020   Right upper quadrant abdominal pain 12/16/2019   Precordial chest pain 12/15/2019   Elevated transaminase level 12/15/2019   MDD (major depressive disorder), recurrent, in partial remission (HCC) 02/07/2019   Severe recurrent major depression without psychotic features (HCC) 02/02/2019   Tardive dyskinesia 12/03/2018   Pure  hypercholesterolemia 06/14/2017   History of stroke 11/11/2015   Memory loss 10/16/2013   Tremor 04/04/2013    Past Surgical History:  Procedure Laterality Date   CHOLECYSTECTOMY N/A 12/17/2019   Procedure: LAPAROSCOPIC CHOLECYSTECTOMY WITH ICG INJECTION;  Surgeon: Emelia Loron, MD;  Location: [facility] OR;  Service: General;  Laterality: N/A;   disckec     shouler arthroscopy Left    SPINAL FUSION     TONSILLECTOMY     TOTAL KNEE ARTHROPLASTY Right 11/15/2021   Procedure: TOTAL KNEE ARTHROPLASTY;  Surgeon: Ollen Gross, MD;  Location: [facility] ORS;  Service: Orthopedics;  Laterality: Right;   VASECTOMY     no per pt   WISDOM TOOTH EXTRACTION         Home Medications    Prior to Admission medications   Medication Sig Start Date End Date Taking? Authorizing Provider  benzonatate (TESSALON) 100 MG capsule Take 1 capsule (100 mg total) by mouth every 8 (eight) hours. 12/04/22  Yes Radford Pax, NP  acetaminophen (TYLENOL) 500 MG tablet Take 1-2 tablets (500-1,000 mg total) by mouth every 6 (six) hours as needed for moderate pain. 11/18/21   Edmisten, Kristie L, PA  acidophilus (RISAQUAD) CAPS capsule Take by mouth daily.    [provider]  allopurinol (ZYLOPRIM) 100 MG tablet Take 50 mg by mouth. 09/14/22   [provider]  amLODipine (NORVASC) 5 MG tablet Take 1  tablet (5 mg total) by mouth daily. 03/12/20   Chandrasekhar, Mahesh A, MD  busPIRone (BUSPAR) 30 MG tablet Take 30 mg by mouth 2 (two) times daily.    [provider]  candesartan (ATACAND) 32 MG tablet Take 32 mg by mouth daily. 11/22/19   [provider]  cetirizine (ZYRTEC ALLERGY) 10 MG tablet Take 1 tablet (10 mg total) by mouth daily. Patient taking differently: Take 10 mg by mouth at bedtime. 04/25/22   Wallis Bamberg, PA-C  clopidogrel (PLAVIX) 75 MG tablet Take 75 mg by mouth daily. 03/22/22   [provider]  fluticasone (FLONASE) 50 MCG/ACT nasal spray Place 2 sprays into both  nostrils daily. 04/25/22   Wallis Bamberg, PA-C  folic acid (FOLVITE) 1 MG tablet Take 1 mg by mouth daily. 11/10/21   [provider]  furosemide (LASIX) 40 MG tablet Take 40 mg by mouth in the morning.    [provider]  hydrALAZINE (APRESOLINE) 50 MG tablet Take 1 tablet (50 mg total) by mouth 2 (two) times daily. 03/16/20   Christell Constant, MD  hydroxychloroquine (PLAQUENIL) 200 MG tablet Take 400 mg by mouth at bedtime.    [provider]  lidocaine (LIDODERM) 5 % Place 1 patch onto the skin daily. Remove & Discard patch within 12 hours or as directed by MD    [provider]  metoprolol succinate (TOPROL XL) 25 MG 24 hr tablet Take 1 tablet (25 mg total) by mouth daily. Patient taking differently: Take 50 mg by mouth daily. 09/24/21   Chandrasekhar, Rondel Jumbo, MD  PARoxetine (PAXIL-CR) 37.5 MG 24 hr tablet Take 1 tablet (37.5 mg total) by mouth at bedtime. Patient taking differently: Take 37.5 mg by mouth daily. 09/03/21   Shanna Cisco, NP  primidone (MYSOLINE) 250 MG tablet TAKE 1 TABLET BY MOUTH TWICE A DAY 11/16/22   Micki Riley, MD  QUEtiapine (SEROQUEL) 25 MG tablet Take 25 mg by mouth at bedtime.    [provider]  rosuvastatin (CRESTOR) 40 MG tablet Take 40 mg by mouth daily.    [provider]  sildenafil (VIAGRA) 50 MG tablet Take 100 mg by mouth as needed for erectile dysfunction.    [provider]  spironolactone (ALDACTONE) 25 MG tablet Take 25 mg by mouth daily. 09/14/20   [provider]  Suvorexant (BELSOMRA) 10 MG TABS Take 1 tablet by mouth at bedtime. 09/03/21   Shanna Cisco, NP  traZODone (DESYREL) 150 MG tablet Take 1 tablet (150 mg total) by mouth at bedtime as needed. for sleep Patient taking differently: Take 150 mg by mouth as needed. for sleep 09/03/21   Shanna Cisco, NP  valACYclovir (VALTREX) 1000 MG tablet Take 1,000 mg by mouth daily.    [provider]   Vitamin D, Ergocalciferol, (DRISDOL) 1.25 MG (50000 UNIT) CAPS capsule Take 50,000 Units by mouth every Saturday. 09/17/20   [provider]    Family History Family History  Problem Relation Age of Onset   Hypertension Mother    Hypertension Father    Hypertension Sister    Hypertension Brother    Cancer Maternal Aunt    Cancer - Ovarian Maternal Grandmother    Diabetes Mellitus I Paternal Grandmother    Hypertension Paternal Grandfather    Cancer Maternal Aunt     Social History Social History   Tobacco Use   Smoking status: Former   Smokeless tobacco: Never  Building services engineer  status: Never Used  Substance Use Topics   Alcohol use: No    Alcohol/week: 1.0 standard drink of alcohol    Types: 1 Cans of beer per week   Drug use: No     Allergies   Amitriptyline, Aspirin, Lisinopril, Lithium, Morphine, Nsaids, Oxycodone, Oxycodone hcl, Oxycodone-acetaminophen, Pork allergy, and Pork-derived products   Review of Systems Review of Systems  HENT:  Positive for congestion and sore throat.   Respiratory:  Positive for cough.      Physical Exam Triage Vital Signs ED Triage Vitals  Encounter Vitals Group     BP 12/04/22 1533 112/72     Systolic BP Percentile --      Diastolic BP Percentile --      Pulse Rate 12/04/22 1533 (!) 55     Resp 12/04/22 1533 16     Temp 12/04/22 1533 97.9 F (36.6 C)     Temp Source 12/04/22 1533 Oral     SpO2 12/04/22 1533 96 %     Weight --      Height --      Head Circumference --      Peak Flow --      Pain Score 12/04/22 1531 0     Pain Loc --      Pain Education --      Exclude from Growth Chart --    No data found.  Updated Vital Signs BP 112/72 (BP Location: Right Arm)   Pulse (!) 55   Temp 97.9 F (36.6 C) (Oral)   Resp 16   SpO2 96%   Visual Acuity Right Eye Distance:   Left Eye Distance:   Bilateral Distance:    Right Eye Near:   Left Eye Near:    Bilateral Near:     Physical Exam Vitals and  nursing note reviewed.  Constitutional:      General: He is not in acute distress.    Appearance: Normal appearance. He is not ill-appearing or toxic-appearing.  HENT:     Head: Normocephalic and atraumatic.     Right Ear: Tympanic membrane and ear canal normal.     Left Ear: Tympanic membrane and ear canal normal.     Nose: Congestion present.     Mouth/Throat:     Mouth: Mucous membranes are moist.     Pharynx: Posterior oropharyngeal erythema present.  Eyes:     Pupils: Pupils are equal, round, and reactive to light.  Cardiovascular:     Rate and Rhythm: Normal rate and regular rhythm.     Heart sounds: Normal heart sounds.  Pulmonary:     Effort: Pulmonary effort is normal.     Breath sounds: Normal breath sounds.  Musculoskeletal:     Cervical back: Normal range of motion and neck supple.  Lymphadenopathy:     Cervical: No cervical adenopathy.  Skin:    General: Skin is warm and dry.  Neurological:     General: No focal deficit present.     Mental Status: He is alert and oriented to person, place, and time.  Psychiatric:        Mood and Affect: Mood normal.        Behavior: Behavior normal.      UC Treatments / Results  Labs (all labs ordered are listed, but only abnormal results are displayed) Labs Reviewed  CULTURE, GROUP A STREP [facility])  POCT RAPID STREP A (OFFICE)  POC COVID19/FLU A&B COMBO    EKG   Radiology  No results found.  Procedures Procedures (including critical care time)  Medications Ordered in UC Medications - No data to display  Initial Impression / Assessment and Plan / UC Course  I have reviewed the triage vital signs and the nursing notes.  Pertinent labs & imaging results that were available during my care of the patient were reviewed by me and considered in my medical decision making (see chart for details).     Reviewed exam and symptoms with patient.  No red flags.  Negative rapid strep, flu, COVID testing.  Will send throat  culture.  Discussed viral illness and symptomatic treatment.  Tessalon as needed for cough.  PCP follow-up if symptoms do not improve.  ER precautions reviewed. Final Clinical Impressions(s) / UC Diagnoses   Final diagnoses:  Sore throat  Viral upper respiratory illness     Discharge Instructions      Start Tessalon as needed for cough.  Lots of rest and fluids.  You may take Tylenol or ibuprofen over-the-counter as needed for throat pain.  Salt water gargles and warm liquids.  Please follow-up with your PCP if your symptoms do not improve.  Please go to the ER for any worsening symptoms.  I hope you feel better soon!    ED Prescriptions     Medication Sig Dispense Auth. Provider   benzonatate (TESSALON) 100 MG capsule Take 1 capsule (100 mg total) by mouth every 8 (eight) hours. 21 capsule Radford Pax, NP      PDMP not reviewed this encounter.   Radford Pax, NP 12/04/22 1558

## 2022-12-04 NOTE — Discharge Instructions (Signed)
Start Tessalon as needed for cough.  Lots of rest and fluids.  You may take Tylenol or ibuprofen over-the-counter as needed for throat pain.  Salt water gargles and warm liquids.  Please follow-up with your PCP if your symptoms do not improve.  Please go to the ER for any worsening symptoms.  I hope you feel better soon!

## 2022-12-04 NOTE — ED Triage Notes (Signed)
Pt presents to UC w/ c/o sore throat, headache, cough  x1 day.

## 2022-12-05 ENCOUNTER — Other Ambulatory Visit: Payer: Self-pay | Admitting: Neurology

## 2022-12-05 DIAGNOSIS — G250 Essential tremor: Secondary | ICD-10-CM

## 2022-12-07 DIAGNOSIS — Z6832 Body mass index (BMI) 32.0-32.9, adult: Secondary | ICD-10-CM | POA: Diagnosis not present

## 2022-12-07 DIAGNOSIS — J029 Acute pharyngitis, unspecified: Secondary | ICD-10-CM | POA: Diagnosis not present

## 2022-12-07 DIAGNOSIS — R0982 Postnasal drip: Secondary | ICD-10-CM | POA: Diagnosis not present

## 2022-12-07 DIAGNOSIS — R0602 Shortness of breath: Secondary | ICD-10-CM | POA: Diagnosis not present

## 2022-12-07 DIAGNOSIS — R059 Cough, unspecified: Secondary | ICD-10-CM | POA: Diagnosis not present

## 2022-12-07 DIAGNOSIS — J209 Acute bronchitis, unspecified: Secondary | ICD-10-CM | POA: Diagnosis not present

## 2022-12-07 DIAGNOSIS — J111 Influenza due to unidentified influenza virus with other respiratory manifestations: Secondary | ICD-10-CM | POA: Diagnosis not present

## 2022-12-08 LAB — CULTURE, GROUP A STREP (THRC)

## 2022-12-12 DIAGNOSIS — M79641 Pain in right hand: Secondary | ICD-10-CM | POA: Diagnosis not present

## 2022-12-12 DIAGNOSIS — E669 Obesity, unspecified: Secondary | ICD-10-CM | POA: Diagnosis not present

## 2022-12-12 DIAGNOSIS — Z6831 Body mass index (BMI) 31.0-31.9, adult: Secondary | ICD-10-CM | POA: Diagnosis not present

## 2022-12-12 DIAGNOSIS — M109 Gout, unspecified: Secondary | ICD-10-CM | POA: Diagnosis not present

## 2022-12-12 DIAGNOSIS — M2540 Effusion, unspecified joint: Secondary | ICD-10-CM | POA: Diagnosis not present

## 2022-12-12 DIAGNOSIS — M79642 Pain in left hand: Secondary | ICD-10-CM | POA: Diagnosis not present

## 2022-12-12 DIAGNOSIS — Z79899 Other long term (current) drug therapy: Secondary | ICD-10-CM | POA: Diagnosis not present

## 2022-12-12 DIAGNOSIS — M069 Rheumatoid arthritis, unspecified: Secondary | ICD-10-CM | POA: Diagnosis not present

## 2022-12-14 DIAGNOSIS — Z4889 Encounter for other specified surgical aftercare: Secondary | ICD-10-CM | POA: Diagnosis not present

## 2022-12-14 DIAGNOSIS — Z96651 Presence of right artificial knee joint: Secondary | ICD-10-CM | POA: Diagnosis not present

## 2022-12-14 DIAGNOSIS — M7918 Myalgia, other site: Secondary | ICD-10-CM | POA: Diagnosis not present

## 2022-12-16 ENCOUNTER — Telehealth: Payer: Self-pay | Admitting: Nurse Practitioner

## 2022-12-18 NOTE — Assessment & Plan Note (Addendum)
IgG MGUS, low risk -He had abnormal SPEP with elevated M spike 0.9 and kappa/lambda light chain ratio 1.7 in 03/2022, referred here by nephrologist  -Other labs show Scr 1.5, otherwise normal CMP and mild anemia hgb 12.4 (improving since ortho surgery in 10/2021). No evidence of end organ involvement -Seen by [place] 04/27/22, work up showed IgG specific MGUS, low risk, and negative bone survey. He declined 24 urine for UPEP/light chains, it will hinder his activity/involvement at his residence which may worsen isolation/anxiety/depression. He was encouraged to consider doing this at a later time if he can -the work up was reviewed and nature of MGUS with pt and his daughter today on the phone. This is a benign condition, but with risk of evolving to multiple myeloma at ~1% per year.  -We recommend observation, with SPEP w IFE and light chain labs q 6-12 months and annual f/up. -His daughter would like PCP or nephrology to follow, so he does not need as many specialist appointments. Will ask if they are comfortable to follow -if PCP/nephrology agree, we will see him back as needed in the future, if he has significant M spike increase, CRAB criteria, or new cytopenias.  -Pt and daughter agree with the plan and appreciate the call.  11/18/2022 recent labs showed elevated kappa free light chains at 30.  Lambda free light chains were normal at 17.5.  Kappa/lambda light chain ratio was increased at 1.71.  Multiple myeloma panel showed elevated M protein spike at 1.1.  This was up from 0.7 at last check. 11/23/2022 - Patient's orthopedic provider did have patient do a nuclear medicine bone scan.  It did show increased vascularity and increased uptake of radiotracer surrounding the right knee arthroplasty.  This was found to be nonspecific as increased vascularity and hyperemia can be seen up to 1 year following the right replacement and increased delayed phase deposition can be seen indefinitely.  There is also focal  hyperemia and delayed uptake involving the left patella which is likely degenerative in nature.

## 2022-12-18 NOTE — Progress Notes (Unsigned)
Patient Care Team: Knox Royalty, MD as PCP - General (Family Medicine) Christell Constant, MD as PCP - Cardiology (Cardiology) Marcellina Millin, MD (Psychiatry) Bufford Buttner, MD as Consulting Physician (Nephrology)  Clinic Day:  12/20/2022  Referring physician: Knox Royalty, MD  ASSESSMENT & PLAN:   Assessment & Plan: MGUS (monoclonal gammopathy of unknown significance) IgG MGUS, low risk -He had abnormal SPEP with elevated M spike 0.9 and kappa/lambda light chain ratio 1.7 in 03/2022, referred here by nephrologist  -Other labs show Scr 1.5, otherwise normal CMP and mild anemia hgb 12.4 (improving since ortho surgery in 10/2021). No evidence of end organ involvement -Seen by [place] 04/27/22, work up showed IgG specific MGUS, low risk, and negative bone survey. He declined 24 urine for UPEP/light chains, it will hinder his activity/involvement at his residence which may worsen isolation/anxiety/depression. He was encouraged to consider doing this at a later time if he can -the work up was reviewed and nature of MGUS with pt and his daughter today on the phone. This is a benign condition, but with risk of evolving to multiple myeloma at ~1% per year.  -We recommend observation, with SPEP w IFE and light chain labs q 6-12 months and annual f/up. -His daughter would like PCP or nephrology to follow, so he does not need as many specialist appointments. Will ask if they are comfortable to follow -if PCP/nephrology agree, we will see him back as needed in the future, if he has significant M spike increase, CRAB criteria, or new cytopenias.  -Pt and daughter agree with the plan and appreciate the call.  11/18/2022 recent labs showed elevated kappa free light chains at 30.  Lambda free light chains were normal at 17.5.  Kappa/lambda light chain ratio was increased at 1.71.  Multiple myeloma panel showed elevated M protein spike at 1.1.  This was up from 0.7 at last check. 11/23/2022 -  Patient's orthopedic provider did have patient do a nuclear medicine bone scan.  It did show increased vascularity and increased uptake of radiotracer surrounding the right knee arthroplasty.  This was found to be nonspecific as increased vascularity and hyperemia can be seen up to 1 year following the right replacement and increased delayed phase deposition can be seen indefinitely.  There is also focal hyperemia and delayed uptake involving the left patella which is likely degenerative in nature.    Plan:  Reviewed labs from 11/18/2022 with the patient and his daughter who joined [place] via telephone.  Reviewed significance of MGUS, and that approximately 1% of patients going to develop multiple myeloma.  Since patient has history of rheumatoid arthritis and chronic kidney disease, may need be difficult to discern CRAB criteria from symptoms which may be caused from arthritis, gout, or CKD. Asked that labs be repeated every 6 months rather than yearly since there is small increase in M protein spike which is 1.1, up from 0.7. Patient and his daughter are both agreeable to this however, they would like labs to be drawn per patient's nephrologist, Dr. Bufford Buttner.  Will send Dr. Signe Colt and note with patient's request.  If she is agreeable, will forward her recommended labs to monitor.  Will encourage Dr. Signe Colt to refer patient back to [place] if labs become more abnormal or if additional symptoms occur. Will see the patient back as needed.  The patient understands the plans discussed today and is in agreement with them.  He knows to contact our office if he develops concerns prior to  his next appointment.  I provided 25 minutes of face-to-face time during this encounter and > 50% was spent counseling as documented under my assessment and plan.    Carlean Jews, NP  [facility] - A DEPT OF [facility] [address] [place]  [address] Dept: [phone] Dept Fax: [phone]   No orders of the defined types were placed in this encounter.     CHIEF COMPLAINT:  CC: MGUS   Current Treatment: Observation  INTERVAL HISTORY:  [name] is here today for repeat clinical assessment.  His last visit was with Wylene Men, NP on 05/16/2022 via telephone.  He did have lab work done 11/18/2022.  This showed elevated kappa free light chains at 30.  Lambda free light chains were normal at 17.5.  Kappa/lambda light chain ratio was increased at 1.71.  Multiple myeloma panel showed elevated M protein spike at 1.1.  This was up from 0.7 at last check.  He denies fevers or chills.  He does not have unusual fatigue and has not experienced unexplained weight loss.  He reports chronic arthritic pain.  He does see rheumatology for this.  Of note, patient's orthopedic did have patient do a nuclear medicine bone scan.  It did show increased vascularity and increased uptake of radiotracer surrounding the right knee arthroplasty.  This was found to be nonspecific as increased vascularity and hyperemia can be seen up to 1 year following the right replacement and increased delayed phase deposition can be seen indefinitely.  There is also focal hyperemia and delayed uptake involving the left patella which is likely degenerative in nature.  His appetite is good. His weight has been stable.  I have reviewed the past medical history, past surgical history, social history and family history with the patient and they are unchanged from previous note.  ALLERGIES:  is allergic to amitriptyline, aspirin, lisinopril, lithium, morphine, nsaids, oxycodone, oxycodone hcl, oxycodone-acetaminophen, pork allergy, and pork-derived products.  MEDICATIONS:  Current Outpatient Medications  Medication Sig Dispense Refill   acetaminophen (TYLENOL) 500 MG tablet Take 1-2 tablets (500-1,000 mg total) by mouth every 6 (six) hours as needed for moderate pain. 30 tablet 0    allopurinol (ZYLOPRIM) 100 MG tablet Take 150 mg by mouth daily.     amLODipine (NORVASC) 5 MG tablet Take 1 tablet (5 mg total) by mouth daily. 90 tablet 3   busPIRone (BUSPAR) 30 MG tablet Take 30 mg by mouth 2 (two) times daily.     candesartan (ATACAND) 32 MG tablet Take 32 mg by mouth daily.     cetirizine (ZYRTEC ALLERGY) 10 MG tablet Take 1 tablet (10 mg total) by mouth daily. (Patient taking differently: Take 10 mg by mouth at bedtime.) 30 tablet 0   clopidogrel (PLAVIX) 75 MG tablet Take 75 mg by mouth daily.     fluticasone (FLONASE) 50 MCG/ACT nasal spray Place 2 sprays into both nostrils daily. 16 g 12   folic acid (FOLVITE) 1 MG tablet Take 1 mg by mouth daily.     furosemide (LASIX) 40 MG tablet Take 40 mg by mouth in the morning.     hydrALAZINE (APRESOLINE) 50 MG tablet Take 1 tablet (50 mg total) by mouth 2 (two) times daily. 270 tablet 3   hydroxychloroquine (PLAQUENIL) 200 MG tablet Take 400 mg by mouth at bedtime.     lidocaine (LIDODERM) 5 % Place 1 patch onto the skin daily. Remove & Discard patch  within 12 hours or as directed by MD     metoprolol succinate (TOPROL XL) 25 MG 24 hr tablet Take 1 tablet (25 mg total) by mouth daily. (Patient taking differently: Take 50 mg by mouth daily.) 90 tablet 3   PARoxetine (PAXIL-CR) 37.5 MG 24 hr tablet Take 1 tablet (37.5 mg total) by mouth at bedtime. (Patient taking differently: Take 37.5 mg by mouth daily.) 30 tablet 3   primidone (MYSOLINE) 250 MG tablet TAKE 1 TABLET BY MOUTH TWICE A DAY 60 tablet 0   QUEtiapine (SEROQUEL) 25 MG tablet Take 25 mg by mouth at bedtime.     rosuvastatin (CRESTOR) 40 MG tablet Take 40 mg by mouth daily.     sildenafil (VIAGRA) 50 MG tablet Take 100 mg by mouth as needed for erectile dysfunction.     spironolactone (ALDACTONE) 25 MG tablet Take 25 mg by mouth daily.     Suvorexant (BELSOMRA) 10 MG TABS Take 1 tablet by mouth at bedtime. 30 tablet 2   traZODone (DESYREL) 150 MG tablet Take 1 tablet  (150 mg total) by mouth at bedtime as needed. for sleep (Patient taking differently: Take 100 mg by mouth as needed. for sleep) 30 tablet 3   valACYclovir (VALTREX) 1000 MG tablet Take 1,000 mg by mouth daily.     Vitamin D, Ergocalciferol, (DRISDOL) 1.25 MG (50000 UNIT) CAPS capsule Take 50,000 Units by mouth every Saturday.     acidophilus (RISAQUAD) CAPS capsule Take by mouth daily. (Patient not taking: Reported on 12/20/2022)     benzonatate (TESSALON) 100 MG capsule Take 1 capsule (100 mg total) by mouth every 8 (eight) hours. (Patient not taking: Reported on 12/20/2022) 21 capsule 0   No current facility-administered medications for this visit.     REVIEW OF SYSTEMS:   Constitutional: Denies fevers, chills or abnormal weight loss Eyes: Denies blurriness of vision Ears, nose, mouth, throat, and face: Denies mucositis or sore throat Respiratory: Denies cough, dyspnea or wheezes Cardiovascular: Denies palpitation, chest discomfort or lower extremity swelling Gastrointestinal:  Denies nausea, heartburn or change in bowel habits Skin: Denies abnormal skin rashes Lymphatics: Denies new lymphadenopathy or easy bruising Neurological:Denies numbness, tingling or new weaknesses Behavioral/Psych: Mood is stable, no new changes  Musculoskeletal: Generalized joint tenderness.  Bilateral knee pain.  Hand swelling and elevation. All other systems were reviewed with the patient and are negative.   VITALS:   Today's Vitals   12/20/22 1339  BP: 119/66  Pulse: (!) 58  Resp: 17  Temp: 97.6 F (36.4 C)  TempSrc: Temporal  SpO2: 100%  Weight: 198 lb 14.4 oz (90.2 kg)   Body mass index is 31.15 kg/m.   Wt Readings from Last 3 Encounters:  12/20/22 198 lb 14.4 oz (90.2 kg)  09/21/22 198 lb (89.8 kg)  09/11/22 189 lb 9.5 oz (86 kg)    Body mass index is 31.15 kg/m.  Performance status (ECOG): 1 - Symptomatic but completely ambulatory  PHYSICAL EXAM:   GENERAL:alert, no distress and  comfortable SKIN: skin color, texture, turgor are normal, no rashes or significant lesions EYES: normal, Conjunctiva are pink and non-injected, sclera clear OROPHARYNX:no exudate, no erythema and lips, buccal mucosa, and tongue normal  NECK: supple, thyroid normal size, non-tender, without nodularity LYMPH:  no palpable lymphadenopathy in the cervical, axillary or inguinal LUNGS: clear to auscultation and percussion with normal breathing effort HEART: regular rate & rhythm and no murmurs and no lower extremity edema ABDOMEN:abdomen soft, non-tender and normal bowel sounds  Musculoskeletal:no cyanosis of digits and no clubbing.  Arthritic changes noted of the left hand. NEURO: alert & oriented x 3 with fluent speech, no focal motor/sensory deficits  LABORATORY DATA:  I have reviewed the data as listed    Component Value Date/Time   NA 135 11/18/2022 1112   NA 140 04/09/2021 1401   K 4.8 11/18/2022 1112   CL 103 11/18/2022 1112   CO2 27 11/18/2022 1112   GLUCOSE 94 11/18/2022 1112   BUN 19 11/18/2022 1112   BUN 9 04/09/2021 1401   CREATININE 1.43 (H) 11/18/2022 1112   CALCIUM 9.1 11/18/2022 1112   PROT 7.5 11/18/2022 1112   PROT 7.3 05/29/2014 1503   ALBUMIN 4.3 11/18/2022 1112   ALBUMIN 4.7 05/29/2014 1503   AST 20 11/18/2022 1112   ALT 16 11/18/2022 1112   ALKPHOS 47 11/18/2022 1112   BILITOT 0.3 11/18/2022 1112   GFRNONAA 53 (L) 11/18/2022 1112   GFRAA 84 02/12/2020 1022    Lab Results  Component Value Date   WBC 5.5 11/18/2022   NEUTROABS 3.4 11/18/2022   HGB 11.9 (L) 11/18/2022   HCT 34.2 (L) 11/18/2022   MCV 95.5 11/18/2022   PLT 301 11/18/2022     RADIOGRAPHIC STUDIES: NM Bone Scan 3 Phase  Result Date: 12/10/2022 CLINICAL DATA:  Right knee replacement 10/2021, right knee pain. EXAM: NUCLEAR MEDICINE 3-PHASE BONE SCAN TECHNIQUE: Radionuclide angiographic images, immediate static blood pool images, and 3-hour delayed static images were obtained of the after  intravenous injection of radiopharmaceutical. RADIOPHARMACEUTICALS:  20.0 mCi Tc-77m MDP IV COMPARISON:  None Available. FINDINGS: Vascular phase: Radionuclide angiography demonstrates preferential perfusion the periprosthetic right knee. Blood pool phase: Blood pool phase imaging demonstrates diffuse uptake of radiotracer surrounding the prosthetic components of right total knee arthroplasty in keeping with periprostatic hyperemia. Mild focal uptake radiotracer in the region of the patella also noted. Delayed phase: Delayed phase imaging demonstrates diffusely increased uptake of radiotracer surrounding the prosthetic components. There is mild asymmetric uptake of radiotracer in the region of patella. IMPRESSION: Increased vascularity, hyperemia, and diffusely increased uptake of radiotracer on delayed phase imaging surrounding the right knee arthroplasty components. This is nonspecific as increased vascularity and hyperemia can be seen up to 1 year following knee arthroplasty and increased delayed phase deposition can be seen indefinitely. The findings can be seen both in the setting normal postsurgical change as well as prosthetic infection. Correlation with indium 111 leukocytosis scintigraphy may be helpful for differentiation of these 2 entities. Focal hyperemia and delayed uptake involving the left patella likely degenerative nature. This could be confirmed with dedicated radiographs. Electronically Signed   By: Helyn Numbers M.D.   On: 12/10/2022 23:50

## 2022-12-20 ENCOUNTER — Encounter: Payer: Self-pay | Admitting: Nurse Practitioner

## 2022-12-20 ENCOUNTER — Inpatient Hospital Stay: Payer: Medicare Other | Attending: Nurse Practitioner | Admitting: Nurse Practitioner

## 2022-12-20 VITALS — BP 119/66 | HR 58 | Temp 97.6°F | Resp 17 | Wt 198.9 lb

## 2022-12-20 DIAGNOSIS — N189 Chronic kidney disease, unspecified: Secondary | ICD-10-CM | POA: Insufficient documentation

## 2022-12-20 DIAGNOSIS — M069 Rheumatoid arthritis, unspecified: Secondary | ICD-10-CM | POA: Insufficient documentation

## 2022-12-20 DIAGNOSIS — D472 Monoclonal gammopathy: Secondary | ICD-10-CM | POA: Diagnosis not present

## 2022-12-22 DIAGNOSIS — F411 Generalized anxiety disorder: Secondary | ICD-10-CM | POA: Diagnosis not present

## 2022-12-22 DIAGNOSIS — F331 Major depressive disorder, recurrent, moderate: Secondary | ICD-10-CM | POA: Diagnosis not present

## 2022-12-29 DIAGNOSIS — F332 Major depressive disorder, recurrent severe without psychotic features: Secondary | ICD-10-CM | POA: Diagnosis not present

## 2023-01-02 ENCOUNTER — Other Ambulatory Visit: Payer: Self-pay | Admitting: Neurology

## 2023-01-02 DIAGNOSIS — G250 Essential tremor: Secondary | ICD-10-CM

## 2023-02-16 DIAGNOSIS — M791 Myalgia, unspecified site: Secondary | ICD-10-CM | POA: Insufficient documentation

## 2023-03-28 ENCOUNTER — Other Ambulatory Visit: Payer: Self-pay | Admitting: Neurology

## 2023-03-28 DIAGNOSIS — G250 Essential tremor: Secondary | ICD-10-CM

## 2023-03-28 NOTE — Telephone Encounter (Signed)
 Last seen on 09/21/22 Follow up scheduled on 04/06/23  Rx just filled on 03/28/23

## 2023-04-06 ENCOUNTER — Ambulatory Visit: Payer: Medicare Other | Admitting: Neurology

## 2023-04-06 ENCOUNTER — Encounter: Payer: Self-pay | Admitting: Neurology

## 2023-04-06 VITALS — BP 112/70 | HR 60 | Ht 67.0 in | Wt 199.8 lb

## 2023-04-06 DIAGNOSIS — G250 Essential tremor: Secondary | ICD-10-CM

## 2023-04-06 DIAGNOSIS — G3184 Mild cognitive impairment, so stated: Secondary | ICD-10-CM | POA: Diagnosis not present

## 2023-04-06 NOTE — Progress Notes (Signed)
 [facility] [address] [place]. [place] [address] [phone]       OFFICE FOLLOW UP VISIT NOTE  [name] Date of Birth:  [birth year] Medical Record Number:  [id number]   Referring MD: Silvestre Moment  Reason for Referral: Headaches  HPI: Initial visit 06/04/2020 [name] is a 73 year old African-American male seen today for consultation visit for headaches.  History is obtained from the patient and review of referral notes and I personally reviewed electronic medical records and pertinent imaging films in PACS.  He has past medical history of hypertension, hyperlipidemia, obesity, right brain subcortical infarct in 2011.  He states over 4 months ago he developed new onset of headaches which were mostly nocturnal.  He would wake up from sleep with a severe headache which he describes as bitemporal and spreading into the retro-orbital regions severe 10/10 in intensity with burning and at times throbbing quality.  There was accompanying nausea and he occasionally vomited.  He denied any light or sound sensitivity.  Headache was not relieved by sleeping as he could not sleep.  It would last for hours.  The headache initially occurred on a daily basis for several weeks.  He saw his primary physician who gave him Tylenol with codeine which did not work.  Finally he saw Dr. Knox Royalty who prescribed gabapentin on 04/03/2020 and he has been taking it 1 tablet 3 times daily and the headaches seem to have gone within a few days of starting it.  He however seems to have gained weight particularly on his belly and wants to lose it.  He denied any accompanying symptoms in the form of blurred vision, loss of vision, jaw claudication, myalgias or scalp tenderness.  He does have remote history of occasional migraines in his 74s when he had accompanying light and sound sensitivity as well.  He felt the current headaches were different.  The patient has recently gained weight he does  snore but he has never been tested for sleep apnea.  Patient has lifelong history of mild action tremor tremor involving mostly left upper extremity felt to be related to anxiety which appears stable.  He history of right brain subcortical infarct due to small vessel disease in December 2011 with only minor residual deficits on the left.  He states he has been on Plavix ever since and not missed doses no recurrent stroke or TIA symptoms for more than 10 years.  He states his blood pressure and cholesterol has been under good control but he cannot tell me when the last time it was checked.  He was last seen in our office for follow-up by [facility]U nurse practitioner on 01/09/2020. Update 05/17/2021 ; he returns for follow-up after his initial visit with me nearly a year ago.  His daughter also participated in this visit via telephone.  Patient plans to undergo elective right knee surgery by Dr. Despina Hick next Monday and is here for neurological clearance.  Patient states he is doing well from neurological standpoint.  He has no recurrent TIA stroke symptoms since 2011.  He remains on Plavix which is tolerating well without bruising or bleeding.  He states his blood pressure is under good control.  He remains on Crestor which is tolerating well without muscle aches and pains.  His last lab work on 06/04/2020 had shown LDL cholesterol of 85 mg percent and hemoglobin A1c 5.9.  ESR was 2 mm.  I had also ordered an MRI scan of the brain  at that time because you complained of new onset headaches and MRI had shown only changes of nonspecific white matter disease.  No acute abnormality.  Patient states his headaches have gone cannot remember the last time he had a headache.  He remains on gabapentin 400 twice daily which had caused some weight gain and I suggested he taper and stop it at last visit but he has not done that.  I also referred him for evaluation for sleep apnea and he did see Dr. Merton Border who recommended home sleep  study but for unclear reason that has not been done either.  He also has a essential tremor for which she takes primidone 250 twice daily and is tolerating well without significant side effects and states tremor is well controlled and not functionally disabling.  He is living in independent living but does complain of mild short-term memory difficulties.  His daughter does help arrange his medications for him.  He has been driving.  He uses GPS.  He is still independent in most activities of daily living.  He has no other complaints. Update 05/18/2022 : He returns for follow-up after last visit a year ago.  He is accompanied by his daughter.  He states he is doing well.  Has not had any headaches.  He has not had recurrent stroke or TIA symptoms for more than 10 years.  He remains on Plavix which is tolerating well with only minor bruising and no bleeding.  He states his blood pressure is under good control.  He is tolerating Crestor well without muscle aches and pains.  Continues to have mild tremor in his hands but is not functionally disabling.  He is tolerating Mysoline 250 mg twice daily quite well without any side effects.  Continues to have mild short-term memory and cognitive difficulties.  He is now living in independent living.  His medications are laid out by his daughter the week.  He does not do significant daily standing activities does not go out a lot.  He had right knee replacement surgery done in October and has recovered well from that.  He had a MoCA done on 12/07/2021 and scored 23/30 at [facility]. Update 09/21/2022 : He returns for follow-up after last visit 4 months ago.  His daughter was present over the phone during this visit.  Patient was recently seen in ER on 05/18/2022 prompting this visit with me.  Patient states that he woke up on that day in the morning feeling dizzy and lightheaded.  He called his brother at 2 5 in the morning stating that he was feeling fine.   Patient went to take a shower and suddenly became dizzy and lightheaded.  He felt unsteady on his feet.  He denied any headache, blurred vision, double vision or vertigo.  He mated to the bedroom and lay on the bed.  He called his brother again and his ex-wife canceling his plans of going out for the day.  His brother convinced him to go to the emergency room for evaluation.  Patient denied any bleeding or severe diarrhea or dehydration and stated he had taken his medications as prescribed.  His blood pressure was 129/86 there is unclear if orthostatic vital signs were checked.  Hematocrit was stable at 34.  CMP was significant only for mildly elevated creatinine of 1.5.  CBC was normal.  MRI scan of the brain was obtained which showed no acute abnormality only changes of chronic small vessel disease.  MRI of the brain was motion degraded did not show any large vessel stenosis.  Patient was discharged home and stated that his lightheadedness went away after 4 to 6 hours.  He has not had any recurrent lightheadedness or dizziness since then.  Patient continues to have mild short-term memory and cognitive difficulties which appear to be stable and unchanged.  He has not been doing any mentally challenging activities which I had instructed at last visit.  He does get a little disoriented at times and today had trouble of coming to my office and had to use a GPS.  Continues to have mild upper extremity action tremors which are unchanged and seem to be well-controlled on the current medication regimen of Mysoline.  He remains on multiple medications for blood pressure which has been running on the low side and he plans to discuss this with his primary care physician whether he can come off some of these medicines. Update 04/06/2023 : He returns for follow-up after last visit 6 months ago.  He states he is doing well.  His tremors are quite mild and not interfering with activities of daily living.  He remains on Mysoline  to 50 mg twice daily which is tolerating well without any side effects.  He states his memory and cognitive difficulties are unchanged.  He in fact did quite well today and scored 29/30 on Mini-Mental status exam today which is improved over last visit.  He does participate in some activities like solving crossword puzzles.  He was recently diagnosed with gout and started on allopurinol.  He was also started on hydrochloric cream for rheumatoid arthritis.  He has no complaints today. ROS:   14 system review of systems is positive for tremor, memory loss  , snoring and all other systems negative  PMH:  Past Medical History:  Diagnosis Date   (HFpEF) heart failure with preserved ejection fraction (HCC)    Allergy    Anxiety    Arthritis    Chronic kidney disease    Depression    Frequent urination    GERD (gastroesophageal reflux disease)    Gout    Headache    mirgaines in the past   Herpes    History of nuclear stress test    Myoview 05/2021: EF 77, normal perfusion, low risk   Hyperlipidemia    Hypertension    IBS (irritable bowel syndrome)    Memory loss    MGUS (monoclonal gammopathy of unknown significance)    Obesity    Pre-diabetes    Stroke (HCC)    Urgency of urination     Social History:  Social History   Socioeconomic History   Marital status: Divorced    Spouse name: Not on file   Number of children: 1   Years of education: Masters   Highest education level: Not on file  Occupational History   Occupation: retired    Associate Professor: RETIRED  Tobacco Use   Smoking status: Former   Smokeless tobacco: Never  Advertising account planner   Vaping status: Never Used  Substance and Sexual Activity   Alcohol use: No    Alcohol/week: 1.0 standard drink of alcohol    Types: 1 Cans of beer per week   Drug use: No   Sexual activity: Never  Other Topics Concern   Not on file  Social History Narrative   Patient lives in Independent living @ [facility]   Patient is right-handed.    Patient does not drink  any caffeine.   Social Drivers of Corporate investment banker Strain: Not on file  Food Insecurity: No Food Insecurity (11/15/2021)   Hunger Vital Sign    Worried About Running Out of Food in the Last Year: Never true    Ran Out of Food in the Last Year: Never true  Transportation Needs: No Transportation Needs (11/15/2021)   PRAPARE - Administrator, Civil Service (Medical): No    Lack of Transportation (Non-Medical): No  Physical Activity: Not on file  Stress: Not on file  Social Connections: Not on file  Intimate Partner Violence: Not At Risk (11/15/2021)   Humiliation, Afraid, Rape, and Kick questionnaire    Fear of Current or Ex-Partner: No    Emotionally Abused: No    Physically Abused: No    Sexually Abused: No    Medications:   Current Outpatient Medications on File Prior to Visit  Medication Sig Dispense Refill   acetaminophen (TYLENOL) 500 MG tablet Take 1-2 tablets (500-1,000 mg total) by mouth every 6 (six) hours as needed for moderate pain. 30 tablet 0   allopurinol (ZYLOPRIM) 100 MG tablet Take 150 mg by mouth daily.     amLODipine (NORVASC) 5 MG tablet Take 1 tablet (5 mg total) by mouth daily. 90 tablet 3   busPIRone (BUSPAR) 30 MG tablet Take 30 mg by mouth 2 (two) times daily.     candesartan (ATACAND) 32 MG tablet Take 32 mg by mouth daily.     cetirizine (ZYRTEC ALLERGY) 10 MG tablet Take 1 tablet (10 mg total) by mouth daily. (Patient taking differently: Take 10 mg by mouth at bedtime.) 30 tablet 0   clopidogrel (PLAVIX) 75 MG tablet Take 75 mg by mouth daily.     fluticasone (FLONASE) 50 MCG/ACT nasal spray Place 2 sprays into both nostrils daily. 16 g 12   folic acid (FOLVITE) 1 MG tablet Take 1 mg by mouth daily.     furosemide (LASIX) 40 MG tablet Take 40 mg by mouth in the morning.     hydrALAZINE (APRESOLINE) 50 MG tablet Take 1 tablet (50 mg total) by mouth 2 (two) times daily. 270 tablet 3   Hydroxychloroquine  Sulfate 400 MG TABS Take 1 tablet by mouth daily.     lidocaine (LIDODERM) 5 % Place 1 patch onto the skin daily. Remove & Discard patch within 12 hours or as directed by MD     metoprolol succinate (TOPROL XL) 25 MG 24 hr tablet Take 1 tablet (25 mg total) by mouth daily. (Patient taking differently: Take 50 mg by mouth daily.) 90 tablet 3   PARoxetine (PAXIL-CR) 37.5 MG 24 hr tablet Take 1 tablet (37.5 mg total) by mouth at bedtime. (Patient taking differently: Take 37.5 mg by mouth daily.) 30 tablet 3   primidone (MYSOLINE) 250 MG tablet TAKE 1 TABLET BY MOUTH TWICE A DAY 180 tablet 0   QUEtiapine (SEROQUEL) 25 MG tablet Take 25 mg by mouth at bedtime.     rosuvastatin (CRESTOR) 40 MG tablet Take 40 mg by mouth daily.     sildenafil (VIAGRA) 50 MG tablet Take 100 mg by mouth as needed for erectile dysfunction.     spironolactone (ALDACTONE) 25 MG tablet Take 25 mg by mouth daily.     Suvorexant (BELSOMRA) 10 MG TABS Take 1 tablet by mouth at bedtime. 30 tablet 2   traZODone (DESYREL) 150 MG tablet Take 1 tablet (150 mg total) by mouth at bedtime as needed.  for sleep (Patient taking differently: Take 100 mg by mouth as needed. for sleep) 30 tablet 3   valACYclovir (VALTREX) 1000 MG tablet Take 1,000 mg by mouth daily.     Vitamin D, Ergocalciferol, (DRISDOL) 1.25 MG (50000 UNIT) CAPS capsule Take 50,000 Units by mouth every Saturday.     acidophilus (RISAQUAD) CAPS capsule Take by mouth daily. (Patient not taking: Reported on 04/06/2023)     benzonatate (TESSALON) 100 MG capsule Take 1 capsule (100 mg total) by mouth every 8 (eight) hours. (Patient not taking: Reported on 04/06/2023) 21 capsule 0   hydroxychloroquine (PLAQUENIL) 200 MG tablet Take 400 mg by mouth at bedtime. (Patient not taking: Reported on 04/06/2023)     No current facility-administered medications on file prior to visit.    Allergies:   Allergies  Allergen Reactions   Amitriptyline Other (See Comments)   Aspirin Other (See  Comments)    Dizziness, nausea and vomiting Balance issues   Lisinopril Other (See Comments)    Critical Cough   Lithium Diarrhea    Critical Per pt reaction unknown   Morphine Other (See Comments)   Nsaids Other (See Comments)   Oxycodone Nausea And Vomiting   Oxycodone Hcl Other (See Comments)   Oxycodone-Acetaminophen Nausea And Vomiting and Other (See Comments)    Also causes dizziness   Pork Allergy Other (See Comments)   Pork-Derived Products Other (See Comments)    Unspecified reaction    Physical Exam General: Obese middle-aged African-American male, seated, in no evident distress Head: head normocephalic and atraumatic.   Neck: supple with no carotid or supraclavicular bruits Cardiovascular: regular rate and rhythm, no murmurs Musculoskeletal: no deformity Skin:  no rash/petichiae Vascular:  Normal pulses all extremities  Neurologic Exam Mental Status: Awake and fully alert. Oriented to place and time. Recent and remote memory intact. Attention span, concentration and fund of knowledge appropriate. Mood and affect appropriate.  Diminished recall 2/3.  Able to name 10 animals which can walk on 4 legs.  Clock drawing 4/4. Cranial Nerves: Fundoscopic exam not done. Pupils equal, briskly reactive to light. Extraocular movements full without nystagmus. Visual fields full to confrontation. Hearing intact. Facial sensation intact. Face, tongue, palate moves normally and symmetrically.  Motor: Normal bulk and tone. Normal strength in all tested extremity muscles.  Mild left grip weakness.  Diminished fine finger movements on the left.  Orbits right over left upper extremity.  Mild action tremor of upper extremities left greater than right.  Absent at rest.  No cogwheel rigidity Sensory.: intact to touch , pinprick , position and vibratory sensation.  Coordination: Rapid alternating movements normal in all extremities. Finger-to-nose and heel-to-shin performed accurately  bilaterally. Gait and Station: Arises from chair without difficulty. Stance is normal. Gait demonstrates normal stride length and balance . Able to heel, toe and tandem walk with mild difficulty.  Reflexes: 1+ and symmetric. Toes downgoing.        04/06/2023    2:45 PM 05/18/2022    3:08 PM 01/09/2020    7:28 AM 03/25/2019   11:18 AM 09/12/2018   10:12 AM 03/12/2018   10:17 AM 09/26/2017    8:36 AM  MMSE - Mini Mental State Exam  Not completed:    --     Orientation to time 5 5 4 3 3 4 3   Orientation to Place 5 5 5 4 4 5 5   Registration 3 3 3 3 3 3 3   Attention/ Calculation 5 5 3 2 2  5  5  Recall 3 0 3 3 3 2 3   Language- name 2 objects 2 2 2 2 2 2 2   Language- repeat 1 1 1 1 1 1 1   Language- follow 3 step command 3 3 3 3 3 3 3   Language- read & follow direction 1 1 1 1 1 1 1   Write a sentence 1 1 1 1 1 1 1   Copy design 0 1 1 1 1 1 1   Copy design-comments     named 3 animals    Total score 29 27 27 24 24 28 28       ASSESSMENT: 73 year old African-American male with new onset daily headaches in 2022 of unclear etiology which seems to have responded to gabapentin  and have resolved...  Remote history of right brain subcortical infarct in 2011 with multiple vascular risk factors of obesity, hypertension hyperlipidemia and suspected sleep apnea Longstanding history of benign essential tremor which appears stable.  Subjective memory difficulties daily due to likely mild age-related cognitive impairment. New complaints of transient lightheadedness related to change in posture likely orthostasis.   PLAN: I had a long discussion with the patient and  his daughter whom I spoke to over the phone  regarding his  ,remote stroke, mild cognitive impairment, essential tremors all of which appears quite stable.  He is doing well with neurovascular standpoint without recurrent stroke or TIA symptoms.  Continue Plavix for stroke prevention with aggressive risk factor modification strict control of  hypertension blood pressure goal below 130/90 and lipids with LDL cholesterol goal below 70 mg percent.  Continue Mysoline 250 mg twice daily for his essential tremor which appears to be quite well controlled and is not functionally disabling.  He also has mild memory difficulties due to age-related mild cognitive impairment.  I recommend increase participation in cognitively challenging activities like solving crossword puzzles, playing bridge and sudoku.  We also discussed memory compensation strategies.  Return for follow-up in the future in 6 months or call earlier if necessary.Greater than 50% time during this 35-minute visit were spent in counseling and coordination of care about his new onset headaches as well as remote history of stroke and discussion about stroke prevention and answering questions Delia Heady, MD Note: This document was prepared with digital dictation and possible smart phrase technology. Any transcriptional errors that result from this process are unintentional.

## 2023-04-06 NOTE — Patient Instructions (Signed)
 I had a long discussion with the patient and  his daughter whom I spoke to over the phone  regarding his  ,remote stroke, mild cognitive impairment, essential tremors all of which appears quite stable.  He is doing well with neurovascular standpoint without recurrent stroke or TIA symptoms.  Continue Plavix for stroke prevention with aggressive risk factor modification strict control of hypertension blood pressure goal below 130/90 and lipids with LDL cholesterol goal below 70 mg percent.  Continue Mysoline 250 mg twice daily for his essential tremor which appears to be quite well controlled and is not functionally disabling.  He also has mild memory difficulties due to age-related mild cognitive impairment.  I recommend increase participation in cognitively challenging activities like solving crossword puzzles, playing bridge and sudoku.  We also discussed memory compensation strategies.  Return for follow-up in the future in 6 months or call earlier if necessary.

## 2023-04-24 ENCOUNTER — Telehealth: Payer: Self-pay | Admitting: Neurology

## 2023-04-24 NOTE — Telephone Encounter (Signed)
 LVM and sent mychart msg informing pt of need to reschedule 05/18/23 appt - MD out

## 2023-04-25 ENCOUNTER — Other Ambulatory Visit: Payer: Self-pay | Admitting: Neurology

## 2023-04-25 DIAGNOSIS — G250 Essential tremor: Secondary | ICD-10-CM

## 2023-04-27 ENCOUNTER — Other Ambulatory Visit: Payer: Self-pay

## 2023-04-27 DIAGNOSIS — G250 Essential tremor: Secondary | ICD-10-CM

## 2023-04-27 MED ORDER — PRIMIDONE 250 MG PO TABS: 250.0000 mg | ORAL_TABLET | Freq: Two times a day (BID) | ORAL | 0 refills | Status: DC

## 2023-05-18 ENCOUNTER — Ambulatory Visit: Payer: Medicare Other | Admitting: Neurology

## 2023-06-22 ENCOUNTER — Telehealth: Payer: Self-pay | Admitting: Neurology

## 2023-06-22 NOTE — Telephone Encounter (Signed)
 Appointment details confirmed

## 2023-06-27 ENCOUNTER — Ambulatory Visit: Admitting: Neurology

## 2023-06-30 ENCOUNTER — Inpatient Hospital Stay (HOSPITAL_COMMUNITY)
Admission: EM | Admit: 2023-06-30 | Discharge: 2023-07-03 | DRG: 683 | Disposition: A | Discharge status: Home / Self Care | Source: Skilled Nursing Facility | Attending: Internal Medicine | Admitting: Internal Medicine

## 2023-06-30 ENCOUNTER — Other Ambulatory Visit: Payer: Self-pay

## 2023-06-30 ENCOUNTER — Inpatient Hospital Stay (HOSPITAL_COMMUNITY)

## 2023-06-30 ENCOUNTER — Emergency Department (HOSPITAL_COMMUNITY)

## 2023-06-30 DIAGNOSIS — Z8673 Personal history of transient ischemic attack (TIA), and cerebral infarction without residual deficits: Secondary | ICD-10-CM | POA: Diagnosis not present

## 2023-06-30 DIAGNOSIS — Z7902 Long term (current) use of antithrombotics/antiplatelets: Secondary | ICD-10-CM | POA: Diagnosis not present

## 2023-06-30 DIAGNOSIS — R7303 Prediabetes: Secondary | ICD-10-CM | POA: Diagnosis present

## 2023-06-30 DIAGNOSIS — I5032 Chronic diastolic (congestive) heart failure: Secondary | ICD-10-CM | POA: Diagnosis present

## 2023-06-30 DIAGNOSIS — M7989 Other specified soft tissue disorders: Secondary | ICD-10-CM | POA: Diagnosis present

## 2023-06-30 DIAGNOSIS — N1832 Chronic kidney disease, stage 3b: Secondary | ICD-10-CM | POA: Diagnosis present

## 2023-06-30 DIAGNOSIS — K219 Gastro-esophageal reflux disease without esophagitis: Secondary | ICD-10-CM | POA: Diagnosis present

## 2023-06-30 DIAGNOSIS — N183 Chronic kidney disease, stage 3 unspecified: Secondary | ICD-10-CM | POA: Diagnosis not present

## 2023-06-30 DIAGNOSIS — N179 Acute kidney failure, unspecified: Secondary | ICD-10-CM | POA: Diagnosis not present

## 2023-06-30 DIAGNOSIS — I130 Hypertensive heart and chronic kidney disease with heart failure and stage 1 through stage 4 chronic kidney disease, or unspecified chronic kidney disease: Secondary | ICD-10-CM | POA: Diagnosis present

## 2023-06-30 DIAGNOSIS — D472 Monoclonal gammopathy: Secondary | ICD-10-CM | POA: Diagnosis present

## 2023-06-30 DIAGNOSIS — R55 Syncope and collapse: Principal | ICD-10-CM

## 2023-06-30 DIAGNOSIS — F32A Depression, unspecified: Secondary | ICD-10-CM | POA: Diagnosis present

## 2023-06-30 DIAGNOSIS — I10 Essential (primary) hypertension: Secondary | ICD-10-CM | POA: Diagnosis not present

## 2023-06-30 DIAGNOSIS — R918 Other nonspecific abnormal finding of lung field: Secondary | ICD-10-CM | POA: Diagnosis not present

## 2023-06-30 DIAGNOSIS — I951 Orthostatic hypotension: Secondary | ICD-10-CM | POA: Diagnosis present

## 2023-06-30 DIAGNOSIS — E785 Hyperlipidemia, unspecified: Secondary | ICD-10-CM | POA: Diagnosis present

## 2023-06-30 DIAGNOSIS — Z87891 Personal history of nicotine dependence: Secondary | ICD-10-CM

## 2023-06-30 DIAGNOSIS — E669 Obesity, unspecified: Secondary | ICD-10-CM | POA: Diagnosis present

## 2023-06-30 DIAGNOSIS — Z6831 Body mass index (BMI) 31.0-31.9, adult: Secondary | ICD-10-CM

## 2023-06-30 DIAGNOSIS — K589 Irritable bowel syndrome without diarrhea: Secondary | ICD-10-CM | POA: Diagnosis present

## 2023-06-30 DIAGNOSIS — I959 Hypotension, unspecified: Secondary | ICD-10-CM

## 2023-06-30 DIAGNOSIS — I5030 Unspecified diastolic (congestive) heart failure: Secondary | ICD-10-CM | POA: Diagnosis not present

## 2023-06-30 LAB — CBC WITH DIFFERENTIAL/PLATELET
Abs Immature Granulocytes: 0.01 10*3/uL (ref 0.00–0.07)
Basophils Absolute: 0 10*3/uL (ref 0.0–0.1)
Basophils Relative: 1 %
Eosinophils Absolute: 0.2 10*3/uL (ref 0.0–0.5)
Eosinophils Relative: 4 %
HCT: 31.5 % — ABNORMAL LOW (ref 39.0–52.0)
Hemoglobin: 10.6 g/dL — ABNORMAL LOW (ref 13.0–17.0)
Immature Granulocytes: 0 %
Lymphocytes Relative: 31 %
Lymphs Abs: 1.7 10*3/uL (ref 0.7–4.0)
MCH: 33.3 pg (ref 26.0–34.0)
MCHC: 33.7 g/dL (ref 30.0–36.0)
MCV: 99.1 fL (ref 80.0–100.0)
Monocytes Absolute: 0.5 10*3/uL (ref 0.1–1.0)
Monocytes Relative: 9 %
Neutro Abs: 3 10*3/uL (ref 1.7–7.7)
Neutrophils Relative %: 55 %
Platelets: 259 10*3/uL (ref 150–400)
RBC: 3.18 MIL/uL — ABNORMAL LOW (ref 4.22–5.81)
RDW: 12.2 % (ref 11.5–15.5)
WBC: 5.5 10*3/uL (ref 4.0–10.5)
nRBC: 0 % (ref 0.0–0.2)

## 2023-06-30 LAB — COMPREHENSIVE METABOLIC PANEL WITH GFR
ALT: 23 U/L (ref 0–44)
AST: 24 U/L (ref 15–41)
Albumin: 3.6 g/dL (ref 3.5–5.0)
Alkaline Phosphatase: 48 U/L (ref 38–126)
Anion gap: 8 (ref 5–15)
BUN: 28 mg/dL — ABNORMAL HIGH (ref 8–23)
CO2: 22 mmol/L (ref 22–32)
Calcium: 8.1 mg/dL — ABNORMAL LOW (ref 8.9–10.3)
Chloride: 103 mmol/L (ref 98–111)
Creatinine, Ser: 2.07 mg/dL — ABNORMAL HIGH (ref 0.61–1.24)
GFR, Estimated: 34 mL/min — ABNORMAL LOW (ref >60)
Glucose, Bld: 88 mg/dL (ref 70–99)
Potassium: 4.2 mmol/L (ref 3.5–5.1)
Sodium: 133 mmol/L — ABNORMAL LOW (ref 135–145)
Total Bilirubin: 0.7 mg/dL (ref 0.0–1.2)
Total Protein: 6.6 g/dL (ref 6.5–8.1)

## 2023-06-30 LAB — URINALYSIS, ROUTINE W REFLEX MICROSCOPIC
Bilirubin Urine: NEGATIVE
Glucose, UA: NEGATIVE mg/dL
Hgb urine dipstick: NEGATIVE
Ketones, ur: NEGATIVE mg/dL
Leukocytes,Ua: NEGATIVE
Nitrite: NEGATIVE
Protein, ur: NEGATIVE mg/dL
Specific Gravity, Urine: 1.011 (ref 1.005–1.030)
pH: 5 (ref 5.0–8.0)

## 2023-06-30 MED ORDER — BUSPIRONE HCL 10 MG PO TABS
30.0000 mg | ORAL_TABLET | Freq: Two times a day (BID) | ORAL | Status: DC
  Administered 2023-07-02 – 2023-07-03 (×3): 30 mg via ORAL
  Filled 2023-06-30 (×6): qty 3

## 2023-06-30 MED ORDER — ONDANSETRON HCL 4 MG PO TABS: 4.0000 mg | ORAL_TABLET | Freq: Four times a day (QID) | ORAL | Status: DC | PRN

## 2023-06-30 MED ORDER — ACETAMINOPHEN 500 MG PO TABS: 500.0000 mg | ORAL_TABLET | Freq: Four times a day (QID) | ORAL | Status: DC | PRN

## 2023-06-30 MED ORDER — METOPROLOL SUCCINATE ER 50 MG PO TB24: 50.0000 mg | ORAL_TABLET | Freq: Every day | ORAL | Status: DC

## 2023-06-30 MED ORDER — CLOPIDOGREL BISULFATE 75 MG PO TABS
75.0000 mg | ORAL_TABLET | Freq: Every day | ORAL | Status: DC
  Administered 2023-07-02 – 2023-07-03 (×2): 75 mg via ORAL
  Filled 2023-06-30 (×3): qty 1

## 2023-06-30 MED ORDER — ALLOPURINOL 300 MG PO TABS
150.0000 mg | ORAL_TABLET | Freq: Every day | ORAL | Status: DC
  Administered 2023-07-02 – 2023-07-03 (×2): 150 mg via ORAL
  Filled 2023-06-30 (×3): qty 1

## 2023-06-30 MED ORDER — SODIUM CHLORIDE 0.9 % IV BOLUS: 500.0000 mL | Freq: Once | INTRAVENOUS | Status: AC

## 2023-06-30 MED ORDER — HYDRALAZINE HCL 50 MG PO TABS
50.0000 mg | ORAL_TABLET | Freq: Two times a day (BID) | ORAL | Status: DC
  Filled 2023-06-30: qty 1

## 2023-06-30 MED ORDER — ROSUVASTATIN CALCIUM 10 MG PO TABS
40.0000 mg | ORAL_TABLET | Freq: Every day | ORAL | Status: DC
  Filled 2023-06-30: qty 4

## 2023-06-30 MED ORDER — PAROXETINE HCL ER 12.5 MG PO TB24
37.5000 mg | ORAL_TABLET | Freq: Every day | ORAL | Status: DC
  Administered 2023-07-02: 37.5 mg via ORAL
  Filled 2023-06-30 (×3): qty 3

## 2023-06-30 MED ORDER — VALACYCLOVIR HCL 500 MG PO TABS
1000.0000 mg | ORAL_TABLET | Freq: Every day | ORAL | Status: DC
  Administered 2023-07-01 – 2023-07-03 (×3): 1000 mg via ORAL
  Filled 2023-06-30 (×3): qty 2

## 2023-06-30 MED ORDER — ALBUTEROL SULFATE (2.5 MG/3ML) 0.083% IN NEBU
2.5000 mg | INHALATION_SOLUTION | Freq: Four times a day (QID) | RESPIRATORY_TRACT | Status: DC
Start: 2023-07-01 — End: 2023-07-01
  Filled 2023-06-30: qty 3

## 2023-06-30 MED ORDER — QUETIAPINE FUMARATE 25 MG PO TABS
25.0000 mg | ORAL_TABLET | Freq: Every day | ORAL | Status: DC
  Administered 2023-07-02: 25 mg via ORAL
  Filled 2023-06-30 (×3): qty 1

## 2023-06-30 MED ORDER — FOLIC ACID 1 MG PO TABS
1.0000 mg | ORAL_TABLET | Freq: Every day | ORAL | Status: DC
  Administered 2023-07-02 – 2023-07-03 (×2): 1 mg via ORAL
  Filled 2023-06-30 (×3): qty 1

## 2023-06-30 MED ORDER — PRIMIDONE 250 MG PO TABS
250.0000 mg | ORAL_TABLET | Freq: Two times a day (BID) | ORAL | Status: DC
  Administered 2023-07-02 – 2023-07-03 (×3): 250 mg via ORAL
  Filled 2023-06-30 (×6): qty 1

## 2023-06-30 MED ORDER — HYDROXYCHLOROQUINE SULFATE 200 MG PO TABS
400.0000 mg | ORAL_TABLET | Freq: Every day | ORAL | Status: DC
  Administered 2023-07-02: 400 mg via ORAL
  Filled 2023-06-30 (×3): qty 2

## 2023-06-30 MED ORDER — ONDANSETRON HCL 4 MG/2ML IJ SOLN: 4.0000 mg | Freq: Four times a day (QID) | INTRAMUSCULAR | Status: DC | PRN

## 2023-06-30 MED ORDER — SODIUM CHLORIDE 0.9 % IV SOLN: INTRAVENOUS | Status: DC

## 2023-06-30 NOTE — ED Provider Notes (Addendum)
 [facility] [facility] DEPARTMENT AT [facility] Provider Note   CSN: [id number] Arrival date & time: 06/30/23  1643     Patient presents with: No chief complaint on file.   [name] is a 73 y.o. male.   Patient brought in by EMS.  Patient is a resident of [facility].  He is in independent living.  He was on the fifth hole of a log afterwards when he became lightheaded and dizzy his blood pressure was checked and it was 80/40.  Upon arrival orthostatic standing blood pressure was 114/60 but sitting was 88/56 with stress seems a little unusual heart rate was 60 respiratory rate 16% oxygen on room air.  Upon arrival here his blood pressure supine was 119/65.  Temp 97.9 pulse 59.  Patient states that this has happened a few times this week.  Patient does have a history of high blood pressure.  Patient is on blood pressure medicine.  Patient on Norvasc  patient also takes hydralazine  may not be taking that anymore that was prescribed in February 2022.  Also on Toprol  XL that was started in September 23.  Past medical history significant for stroke hypertension hyperlipidemia heart failure with preserved ejection fraction.  Chronic kidney disease monoclonal GABA neuropathy of unknown significance.  Past surgical hist significant for cholecystectomy.  And total knee arthroplasty on the right side.  Shoulder arthroplasty on the left.  Patient is never used tobacco products.  Patient currently has no complaints.  He feels fine now.  Denies any chest pain headache fever chills nausea vomiting shortness of breath or any weakness or numbness.  Patient's symptoms when he was on the golf [place] pleated that he felt like he was going to pass out lightheadedness and dizziness but no room spinning.       Prior to Admission medications   Medication Sig Start Date End Date Taking? Authorizing Provider  acetaminophen  (TYLENOL ) 500 MG tablet Take 1-2 tablets (500-1,000 mg total) by mouth every  6 (six) hours as needed for moderate pain. 11/18/21   Edmisten, Kristie L, PA  acidophilus (RISAQUAD) CAPS capsule Take by mouth daily. Patient not taking: Reported on 04/06/2023    [provider]  allopurinol (ZYLOPRIM) 100 MG tablet Take 150 mg by mouth daily. 09/14/22   [provider]  amLODipine  (NORVASC ) 5 MG tablet Take 1 tablet (5 mg total) by mouth daily. 03/12/20   Jann Melody, MD  benzonatate  (TESSALON ) 100 MG capsule Take 1 capsule (100 mg total) by mouth every 8 (eight) hours. Patient not taking: Reported on 04/06/2023 12/04/22   Mayer, Jodi R, NP  busPIRone  (BUSPAR ) 30 MG tablet Take 30 mg by mouth 2 (two) times daily.    [provider]  candesartan (ATACAND) 32 MG tablet Take 32 mg by mouth daily. 11/22/19   [provider]  cetirizine  (ZYRTEC  ALLERGY) 10 MG tablet Take 1 tablet (10 mg total) by mouth daily. Patient taking differently: Take 10 mg by mouth at bedtime. 04/25/22   Adolph Hoop, PA-C  clopidogrel  (PLAVIX ) 75 MG tablet Take 75 mg by mouth daily. 03/22/22   [provider]  fluticasone  (FLONASE ) 50 MCG/ACT nasal spray Place 2 sprays into both nostrils daily. 04/25/22   Adolph Hoop, PA-C  folic acid  (FOLVITE ) 1 MG tablet Take 1 mg by mouth daily. 11/10/21   [provider]  furosemide  (LASIX ) 40 MG tablet Take 40 mg by mouth in the morning.    [provider]  hydrALAZINE  (  APRESOLINE ) 50 MG tablet Take 1 tablet (50 mg total) by mouth 2 (two) times daily. 03/16/20   Jann Melody, MD  hydroxychloroquine (PLAQUENIL) 200 MG tablet Take 400 mg by mouth at bedtime. Patient not taking: Reported on 04/06/2023    [provider]  Hydroxychloroquine Sulfate 400 MG TABS Take 1 tablet by mouth daily. 03/28/23   [provider]  lidocaine  (LIDODERM ) 5 % Place 1 patch onto the skin daily. Remove & Discard patch within 12 hours or as directed by MD    [provider]  metoprolol  succinate  (TOPROL  XL) 25 MG 24 hr tablet Take 1 tablet (25 mg total) by mouth daily. Patient taking differently: Take 50 mg by mouth daily. 09/24/21   Jann Melody, MD  PARoxetine  (PAXIL -CR) 37.5 MG 24 hr tablet Take 1 tablet (37.5 mg total) by mouth at bedtime. Patient taking differently: Take 37.5 mg by mouth daily. 09/03/21   Arlyne Bering, NP  primidone  (MYSOLINE ) 250 MG tablet Take 1 tablet (250 mg total) by mouth 2 (two) times daily. 04/27/23   Lisabeth Rider, MD  QUEtiapine  (SEROQUEL ) 25 MG tablet Take 25 mg by mouth at bedtime.    [provider]  rosuvastatin  (CRESTOR ) 40 MG tablet Take 40 mg by mouth daily.    [provider]  sildenafil (VIAGRA) 50 MG tablet Take 100 mg by mouth as needed for erectile dysfunction.    [provider]  spironolactone  (ALDACTONE ) 25 MG tablet Take 25 mg by mouth daily. 09/14/20   [provider]  Suvorexant  (BELSOMRA ) 10 MG TABS Take 1 tablet by mouth at bedtime. 09/03/21   Arlyne Bering, NP  traZODone  (DESYREL ) 150 MG tablet Take 1 tablet (150 mg total) by mouth at bedtime as needed. for sleep Patient taking differently: Take 100 mg by mouth as needed. for sleep 09/03/21   Parsons, Brittney E, NP  valACYclovir (VALTREX) 1000 MG tablet Take 1,000 mg by mouth daily.    [provider]  Vitamin D, Ergocalciferol, (DRISDOL) 1.25 MG (50000 UNIT) CAPS capsule Take 50,000 Units by mouth every Saturday. 09/17/20   [provider]    Allergies: Amitriptyline, Aspirin , Lisinopril, Lithium, Morphine , Nsaids, Oxycodone, Oxycodone hcl, Oxycodone-acetaminophen , Pork allergy, and Pork-derived products    Review of Systems  Constitutional:  Negative for chills and fever.  HENT:  Negative for ear pain and sore throat.   Eyes:  Negative for pain and visual disturbance.  Respiratory:  Negative for cough and shortness of breath.   Cardiovascular:  Negative for chest pain and palpitations.  Gastrointestinal:   Negative for abdominal pain and vomiting.  Genitourinary:  Negative for dysuria and hematuria.  Musculoskeletal:  Negative for arthralgias and back pain.  Skin:  Negative for color change and rash.  Neurological:  Positive for dizziness and light-headedness. Negative for seizures, syncope, facial asymmetry, speech difficulty, weakness, numbness and headaches.  All other systems reviewed and are negative.   Updated Vital Signs BP (!) 153/87   Pulse 63   Temp 97.9 F (36.6 C) (Oral)   Resp 17   SpO2 97%   Physical Exam Vitals and nursing note reviewed.  Constitutional:      General: He is not in acute distress.    Appearance: Normal appearance. He is well-developed. He is not ill-appearing.  HENT:     Head: Normocephalic and atraumatic.     Mouth/Throat:     Mouth: Mucous membranes are moist.   Eyes:  Extraocular Movements: Extraocular movements intact.     Conjunctiva/sclera: Conjunctivae normal.     Pupils: Pupils are equal, round, and reactive to light.    Cardiovascular:     Rate and Rhythm: Normal rate and regular rhythm.     Heart sounds: No murmur heard. Pulmonary:     Effort: Pulmonary effort is normal. No respiratory distress.     Breath sounds: Normal breath sounds. No wheezing or rales.  Abdominal:     Palpations: Abdomen is soft.     Tenderness: There is no abdominal tenderness.   Musculoskeletal:        General: No swelling.     Cervical back: Normal range of motion and neck supple.     Right lower leg: No edema.     Left lower leg: No edema.   Skin:    General: Skin is warm and dry.     Capillary Refill: Capillary refill takes less than 2 seconds.   Neurological:     General: No focal deficit present.     Mental Status: He is alert.   Psychiatric:        Mood and Affect: Mood normal.     (all labs ordered are listed, but only abnormal results are displayed) Labs Reviewed  CBC WITH DIFFERENTIAL/PLATELET - Abnormal; Notable for the following  components:      Result Value   RBC 3.18 (*)    Hemoglobin 10.6 (*)    HCT 31.5 (*)    All other components within normal limits  COMPREHENSIVE METABOLIC PANEL WITH GFR - Abnormal; Notable for the following components:   Sodium 133 (*)    BUN 28 (*)    Creatinine, Ser 2.07 (*)    Calcium  8.1 (*)    GFR, Estimated 34 (*)    All other components within normal limits  URINALYSIS, ROUTINE W REFLEX MICROSCOPIC    EKG: EKG Interpretation Date/Time:  Friday June 30 2023 17:33:29 EDT Ventricular Rate:  60 PR Interval:  231 QRS Duration:  152 QT Interval:  467 QTC Calculation: 467 R Axis:   -72  Text Interpretation: Sinus rhythm Prolonged PR interval RBBB and LAFB No significant change since last tracing Confirmed by Naethan Bracewell [phone]) on 06/30/2023 5:44:34 PM  Radiology: CT Head Wo Contrast Result Date: 06/30/2023 CLINICAL DATA:  Mental status change, unknown cause. Lightheadedness and dizziness while playing golf. Orthostatic hypotension. EXAM: CT HEAD WITHOUT CONTRAST TECHNIQUE: Contiguous axial images were obtained from the base of the skull through the vertex without intravenous contrast. RADIATION DOSE REDUCTION: This exam was performed according to the departmental dose-optimization program which includes automated exposure control, adjustment of the mA and/or kV according to patient size and/or use of iterative reconstruction technique. COMPARISON:  Head CT and MRI 09/11/2022 FINDINGS: Brain: There is no evidence of an acute infarct, intracranial hemorrhage, mass, midline shift, or extra-axial fluid collection. Mild cerebral atrophy is within normal limits for age. The ventricles are normal in size. Age advanced chronic white matter disease predominantly involving the periventricular white matter is similar to the prior CT. Vascular: No hyperdense vessel. Skull: No fracture or suspicious lesion. Sinuses/Orbits: Partial opacification of an anterior right ethmoid air cell. Clear  mastoid air cells. Unremarkable orbits. Other: None. IMPRESSION: 1. No evidence of acute intracranial abnormality. 2. Age advanced chronic white matter disease, stable and nonspecific. Electronically Signed   By: Aundra Lee M.D.   On: 06/30/2023 19:33   DG Chest Port 1 View Result Date: 06/30/2023 CLINICAL DATA:  Syncope. EXAM: PORTABLE CHEST 1 VIEW COMPARISON:  Radiograph 04/25/2022 FINDINGS: Normal cardiac silhouette. LEFT suprahilar density measuring 5.5 x 4.8 cm. No pleural fluid. No pneumothorax. IMPRESSION: LEFT suprahilar density representing pneumonia versus pulmonary mass. Recommend CT thorax with contrast for further evaluation. Electronically Signed   By: Deboraha Fallow M.D.   On: 06/30/2023 18:30     Procedures   Medications Ordered in the ED  0.9 %  sodium chloride  infusion ( Intravenous New Bag/Given 06/30/23 1940)  sodium chloride  0.9 % bolus 500 mL (0 mLs Intravenous Stopped 06/30/23 1941)                                    Medical Decision Making Amount and/or Complexity of Data Reviewed Labs: ordered. Radiology: ordered.  Risk Prescription drug management. Decision regarding hospitalization.   Patient here asymptomatic.  Will give some fluids will do cardiac monitoring.  Since this has happened a few times.  And patient does have a power of attorney.  Will go ahead and get CT head as well chest x-ray and basic labs.  Then reassess orthostatics.  Ultimately I think patient probably needs to hold his blood pressure medicines sounds like his pressure is getting too low.  Here is 119/65 so extremely tight control.  Patient feeling better.  Blood pressure is definitely better.  Labs were delayed.  CBC white count 5.5 hemoglobin 10.6 platelets are 259.  Blood pressure is now 150 systolic.  Complete metabolic panel sodium at 133 creatinine was 2.07 BUN 28 for GFR 34.  This is significantly worse than his baseline creatinine.  This was after patient received fluids.   Urinalysis negative.  CT head without any acute findings.  And portable chest x-ray showed a left suprahilar density representing pneumonia versus pulmonary mass recommend CT chest with contrast I will go ahead and order that.  But we will go ahead and get him admitted.  Also based on his renal function.  What to do CT chest without.  Will see if that is helpful or not.      Final diagnoses:  Near syncope  AKI (acute kidney injury) (HCC)  Hypotension, unspecified hypotension type    ED Discharge Orders     None          Nicklas Barns, MD 06/30/23 2154    Nicklas Barns, MD 06/30/23 2155

## 2023-06-30 NOTE — ED Triage Notes (Signed)
 Per EMS from [facility]. Hx dehydration. Reports light headedness and dizziness while golfing. Checked own BP 80/40.   Orthostatics Standing BP 114/60 Sitting 88/56 RR 16 HR 60 100 on RA

## 2023-06-30 NOTE — H&P (Signed)
 History and Physical    [name] ZOX:[id number] DOB: 1950-10-11 DOA: 06/30/2023  PCP: Trellis Fries, MD  Patient coming from: facility   I have personally briefly reviewed patient's old medical records in [facility]  Chief Complaint: near syncope  HPI: [name] is a 73 y.o. male with medical history significant of  hypertension, hyperlipidemia, MCI, obesity, right brain subcortical infarct in 2011 CHFpef, CKDIII, GERD, depression, IBS,HTN,HLD, MGUS, prediabetes Who presents from [facility] with near syncope. Per EMS in the field patient bp was 80/40. Per daughter who aides in history patient has had difficulty with light headedness for the past week. Patient notes no n/v/ but states he has chronic loose stools due to his IBS , he no real significant change in his stools or increase abdominal pain. He also denies fever/chill/ chest pain or shortness of breath.  He currently notes he's does still feel lightheaded with positional changes.  ED Course:  IN ED vitals  note patient to be orthostatic Orthostatics Standing BP 114/60 Sitting 88/56 RR 16 HR 60 100 on RA Cxr : IMPRESSION: LEFT suprahilar density representing pneumonia versus pulmonary mass. Recommend CT thorax with contrast for further evaluation.  RVP negative   CTH IMPRESSION: 1. No evidence of acute intracranial abnormality. 2. Age advanced chronic white matter disease, stable and nonspecific.  EKG: Snr RBBB, LAFB no change from prior Labs 133, K 4.2, cr 2.07 ( 1.43) Wbc 5.5, hgb 10.6, plt 259  CT chest IMPRESSION: Soft tissue mass in the left suprahilar region suspicious for pulmonary neoplasm. Per Fleischner Society Guidelines, recommend a PET/CT or tissue sampling. These guidelines do not apply to immunocompromised patients and patients with cancer. Follow up in patients with significant comorbidities as clinically warranted. For lung cancer screening, adhere to Lung-RADS guidelines.  Reference: Radiology. 2017; 284(1):228-43. Review of Systems: As per HPI otherwise 10 point review of systems negative.   Past Medical History:  Diagnosis Date   (HFpEF) heart failure with preserved ejection fraction (HCC)    Allergy    Anxiety    Arthritis    Chronic kidney disease    Depression    Frequent urination    GERD (gastroesophageal reflux disease)    Gout    Headache    mirgaines in the past   Herpes    History of nuclear stress test    Myoview  05/2021: EF 77, normal perfusion, low risk   Hyperlipidemia    Hypertension    IBS (irritable bowel syndrome)    Memory loss    MGUS (monoclonal gammopathy of unknown significance)    Obesity    Pre-diabetes    Stroke [facility])    Urgency of urination     Past Surgical History:  Procedure Laterality Date   CHOLECYSTECTOMY N/A 12/17/2019   Procedure: LAPAROSCOPIC CHOLECYSTECTOMY WITH ICG INJECTION;  Surgeon: Enid Harry, MD;  Location: [facility] OR;  Service: General;  Laterality: N/A;   disckec     shouler arthroscopy Left    SPINAL FUSION     TONSILLECTOMY     TOTAL KNEE ARTHROPLASTY Right 11/15/2021   Procedure: TOTAL KNEE ARTHROPLASTY;  Surgeon: Liliane Rei, MD;  Location: [facility] ORS;  Service: Orthopedics;  Laterality: Right;   VASECTOMY     no per pt   WISDOM TOOTH EXTRACTION       reports that he has quit smoking. He has never used smokeless tobacco. He reports that he does not drink alcohol  and does not use drugs.  Allergies  Allergen Reactions   Divalproex Sodium Hives   Lamotrigine Other (See Comments)    Shake,unsteady,diahhrea  lamotrigine   Valproic Acid Hives and Other (See Comments)    Shakes GI problems, too   Amitriptyline Other (See Comments)    Reaction not known   Aspirin  Other (See Comments), Nausea And Vomiting and Nausea Only    Dizziness, nausea and vomiting  Balance issues  Other Reaction(s): Not available  Nausea weakness sweats  Nausea weakness sweats    Dizziness, nausea and  vomiting Balance issues  aluminum aspirin    Lisinopril Other (See Comments) and Cough    Critical  Cough  Other Reaction(s): Not available  lisinopril   Lithium Diarrhea    Critical  Per pt reaction unknown  Fall over shakes diarrhea  lithium   Morphine  Other (See Comments)     'not tolerated well   Nsaids Other (See Comments)    Kidney disease   Oxycodone Nausea And Vomiting   Oxycodone Hcl Other (See Comments)   Oxycodone-Acetaminophen  Nausea And Vomiting and Other (See Comments)    Also causes dizziness  Other Reaction(s): Not available  acetaminophen  / oxycodone   Pork Allergy Nausea Only   Pork-Derived Products Other (See Comments)    Unspecified reaction    Family History  Problem Relation Age of Onset   Hypertension Mother    Hypertension Father    Hypertension Sister    Hypertension Brother    Cancer Maternal Aunt    Cancer - Ovarian Maternal Grandmother    Diabetes Mellitus I Paternal Grandmother    Hypertension Paternal Grandfather    Cancer Maternal Aunt     Prior to Admission medications   Medication Sig Start Date End Date Taking? Authorizing Provider  amoxicillin (AMOXIL) 500 MG capsule Take 1,000 mg by mouth 2 (two) times daily. 04/04/23  Yes [provider]  BELSOMRA  5 MG TABS Take 1 tablet by mouth at bedtime as needed. 06/08/23  Yes [provider]  metoprolol  succinate (TOPROL -XL) 50 MG 24 hr tablet Take 50 mg by mouth daily. 06/14/23  Yes [provider]  acetaminophen  (TYLENOL ) 500 MG tablet Take 1-2 tablets (500-1,000 mg total) by mouth every 6 (six) hours as needed for moderate pain. 11/18/21   Edmisten, Kristie L, PA  acidophilus (RISAQUAD) CAPS capsule Take by mouth daily.    [provider]  allopurinol (ZYLOPRIM) 100 MG tablet Take 150 mg by mouth daily. 09/14/22   [provider]  amLODipine  (NORVASC ) 5 MG tablet Take 1 tablet (5 mg total) by mouth daily. 03/12/20   Jann Melody,  MD  benzonatate  (TESSALON ) 100 MG capsule Take 1 capsule (100 mg total) by mouth every 8 (eight) hours. 12/04/22   Alleen Arbour, NP  busPIRone  (BUSPAR ) 30 MG tablet Take 30 mg by mouth 2 (two) times daily.    [provider]  candesartan (ATACAND) 32 MG tablet Take 32 mg by mouth daily. 11/22/19   [provider]  cetirizine  (ZYRTEC  ALLERGY) 10 MG tablet Take 1 tablet (10 mg total) by mouth daily. Patient taking differently: Take 10 mg by mouth at bedtime. 04/25/22   Adolph Hoop, PA-C  clopidogrel  (PLAVIX ) 75 MG tablet Take 75 mg by mouth daily. 03/22/22   [provider]  fluticasone  (FLONASE ) 50 MCG/ACT nasal spray Place 2 sprays into both nostrils daily. 04/25/22   Adolph Hoop, PA-C  folic acid  (FOLVITE ) 1 MG tablet Take 1 mg by mouth daily. 11/10/21   [provider]  furosemide  (LASIX ) 40 MG tablet Take 40 mg by mouth in the morning.    [provider]  hydrALAZINE  (APRESOLINE ) 50 MG tablet Take 1 tablet (50 mg total) by mouth 2 (two) times daily. 03/16/20   Gloriann Larger A, MD  lidocaine  (LIDODERM ) 5 % Place 1 patch onto the skin daily. Remove & Discard patch within 12 hours or as directed by MD    [provider]  metoprolol  succinate (TOPROL  XL) 25 MG 24 hr tablet Take 1 tablet (25 mg total) by mouth daily. Patient taking differently: Take 50 mg by mouth daily. 09/24/21   Chandrasekhar, Caretha Chapel, MD  PARoxetine  (PAXIL -CR) 37.5 MG 24 hr tablet Take 1 tablet (37.5 mg total) by mouth at bedtime. Patient taking differently: Take 37.5 mg by mouth daily. 09/03/21   Arlyne Bering, NP  primidone  (MYSOLINE ) 250 MG tablet Take 1 tablet (250 mg total) by mouth 2 (two) times daily. 04/27/23   Lisabeth Rider, MD  QUEtiapine  (SEROQUEL ) 25 MG tablet Take 25 mg by mouth at bedtime.    [provider]  rosuvastatin  (CRESTOR ) 40 MG tablet Take 40 mg by mouth daily.    [provider]  sildenafil (VIAGRA) 50 MG tablet Take 100 mg by  mouth as needed for erectile dysfunction.    [provider]  spironolactone  (ALDACTONE ) 25 MG tablet Take 25 mg by mouth daily. 09/14/20   [provider]  Suvorexant  (BELSOMRA ) 10 MG TABS Take 1 tablet by mouth at bedtime. 09/03/21   Arlyne Bering, NP  traZODone  (DESYREL ) 100 MG tablet Take 100 mg by mouth at bedtime.    [provider]  valACYclovir (VALTREX) 1000 MG tablet Take 1,000 mg by mouth daily.    [provider]  Vitamin D, Ergocalciferol, (DRISDOL) 1.25 MG (50000 UNIT) CAPS capsule Take 50,000 Units by mouth every Saturday. 09/17/20   [provider]    Physical Exam: Vitals:   06/30/23 1659 06/30/23 1745 06/30/23 2000 06/30/23 2045  BP: 119/65 123/82 (!) 132/96 (!) 153/87  Pulse: (!) 59 64 63   Resp: 16 19 18 17   Temp: 97.9 F (36.6 C)   97.9 F (36.6 C)  TempSrc: Oral   Oral  SpO2: 100% (!) 83% 97% 97%    Constitutional: NAD, calm, comfortable Vitals:   06/30/23 1659 06/30/23 1745 06/30/23 2000 06/30/23 2045  BP: 119/65 123/82 (!) 132/96 (!) 153/87  Pulse: (!) 59 64 63   Resp: 16 19 18 17   Temp: 97.9 F (36.6 C)   97.9 F (36.6 C)  TempSrc: Oral   Oral  SpO2: 100% (!) 83% 97% 97%   Eyes: PERRL, lids and conjunctivae normal ENMT: Mucous membranes are moist. Posterior pharynx clear of any exudate or lesions.Normal dentition.  Neck: normal, supple, no masses, no thyromegaly Respiratory: clear to auscultation bilaterally, no wheezing, no crackles. Normal respiratory effort. No accessory muscle use.  Cardiovascular: Regular rate and rhythm, no murmurs / rubs / gallops. No extremity edema. 2+ pedal pulses. No carotid bruits.  Abdomen: no tenderness, no masses palpated. No hepatosplenomegaly. Bowel sounds positive.  Musculoskeletal: no clubbing / cyanosis. No joint deformity upper and lower extremities. Good ROM, no contractures. Normal muscle tone.  Skin: no rashes, lesions, ulcers. No induration Neurologic: CN 2-12  grossly intact. Sensation intact, DTR normal. Strength 5/5 in all 4.  Psychiatric: Normal judgment and insight. Alert and oriented x 3. Normal mood.    Labs on Admission: I have personally reviewed following labs  and imaging studies  CBC: Recent Labs  Lab 06/30/23 2102  WBC 5.5  NEUTROABS 3.0  HGB 10.6*  HCT 31.5*  MCV 99.1  PLT 259   Basic Metabolic Panel: Recent Labs  Lab 06/30/23 2102  NA 133*  K 4.2  CL 103  CO2 22  GLUCOSE 88  BUN 28*  CREATININE 2.07*  CALCIUM  8.1*   GFR: CrCl cannot be calculated (Unknown ideal weight.). Liver Function Tests: Recent Labs  Lab 06/30/23 2102  AST 24  ALT 23  ALKPHOS 48  BILITOT 0.7  PROT 6.6  ALBUMIN 3.6   No results for input(s): LIPASE, AMYLASE in the last 168 hours. No results for input(s): AMMONIA in the last 168 hours. Coagulation Profile: No results for input(s): INR, PROTIME in the last 168 hours. Cardiac Enzymes: No results for input(s): CKTOTAL, CKMB, CKMBINDEX, TROPONINI in the last 168 hours. BNP (last 3 results) No results for input(s): PROBNP in the last 8760 hours. HbA1C: No results for input(s): HGBA1C in the last 72 hours. CBG: No results for input(s): GLUCAP in the last 168 hours. Lipid Profile: No results for input(s): CHOL, HDL, LDLCALC, TRIG, CHOLHDL, LDLDIRECT in the last 72 hours. Thyroid  Function Tests: No results for input(s): TSH, T4TOTAL, FREET4, T3FREE, THYROIDAB in the last 72 hours. Anemia Panel: No results for input(s): VITAMINB12, FOLATE, FERRITIN, TIBC, IRON, RETICCTPCT in the last 72 hours. Urine analysis:    Component Value Date/Time   COLORURINE YELLOW 06/30/2023 1853   APPEARANCEUR CLEAR 06/30/2023 1853   LABSPEC 1.011 06/30/2023 1853   PHURINE 5.0 06/30/2023 1853   GLUCOSEU NEGATIVE 06/30/2023 1853   HGBUR NEGATIVE 06/30/2023 1853   BILIRUBINUR NEGATIVE 06/30/2023 1853   KETONESUR NEGATIVE 06/30/2023 1853    PROTEINUR NEGATIVE 06/30/2023 1853   NITRITE NEGATIVE 06/30/2023 1853   LEUKOCYTESUR NEGATIVE 06/30/2023 1853    Radiological Exams on Admission: CT Head Wo Contrast Result Date: 06/30/2023 CLINICAL DATA:  Mental status change, unknown cause. Lightheadedness and dizziness while playing golf. Orthostatic hypotension. EXAM: CT HEAD WITHOUT CONTRAST TECHNIQUE: Contiguous axial images were obtained from the base of the skull through the vertex without intravenous contrast. RADIATION DOSE REDUCTION: This exam was performed according to the departmental dose-optimization program which includes automated exposure control, adjustment of the mA and/or kV according to patient size and/or use of iterative reconstruction technique. COMPARISON:  Head CT and MRI 09/11/2022 FINDINGS: Brain: There is no evidence of an acute infarct, intracranial hemorrhage, mass, midline shift, or extra-axial fluid collection. Mild cerebral atrophy is within normal limits for age. The ventricles are normal in size. Age advanced chronic white matter disease predominantly involving the periventricular white matter is similar to the prior CT. Vascular: No hyperdense vessel. Skull: No fracture or suspicious lesion. Sinuses/Orbits: Partial opacification of an anterior right ethmoid air cell. Clear mastoid air cells. Unremarkable orbits. Other: None. IMPRESSION: 1. No evidence of acute intracranial abnormality. 2. Age advanced chronic white matter disease, stable and nonspecific. Electronically Signed   By: Aundra Lee M.D.   On: 06/30/2023 19:33   DG Chest Port 1 View Result Date: 06/30/2023 CLINICAL DATA:  Syncope. EXAM: PORTABLE CHEST 1 VIEW COMPARISON:  Radiograph 04/25/2022 FINDINGS: Normal cardiac silhouette. LEFT suprahilar density measuring 5.5 x 4.8 cm. No pleural fluid. No pneumothorax. IMPRESSION: LEFT suprahilar density representing pneumonia versus pulmonary mass. Recommend CT thorax with contrast for further evaluation.  Electronically Signed   By: Deboraha Fallow M.D.   On: 06/30/2023 18:30    EKG: Independently reviewed.  Assessment/Plan   AKI  on CKDIIIb -base cr 1.4 , 2 on admit  -hold nephrotoxic medications - ivfs overnight  -repeat orthostatic in am    Soft tissue mass in the left suprahilar region suspicious for pulmonary neoplasm -will need pulmonary /IR consult in am   Hypertension -resume home regimen in am if patient has negative orthostatic   Hyperlipidemia -resume crestor     MCI -no active issues   Right brain subcortical infarct in 2011 -continue secondary ppx with plavix     CHFpef -compensated  -resume GDMT as based on bp and renal function   GERD -ppi   Depression -resume home regimen    IBS -appears having 4 loose stools per day  - will need to follow for further evaluation in setting of presentation with dehydration   MGUS -followed by oncology  Sharyon Deis, NP  [facility] On surveillance   Prediabetes -monitor poc  DVT prophylaxis:  heparin Code Status: full/ as discussed per patient wishes in event of cardiac arrest  Family Communication: Pelot,Whitney (Daughter) [phone] (Mobile)  Disposition Plan: patient  expected to be admitted greater than 2 midnights  Consults called: please call pulmonary/oncology in am Admission status: tele    Sabas Cradle MD [facility]  If 7PM-7AM, please contact night-coverage [email or web address] Password [facility]  06/30/2023, 10:28 PM

## 2023-07-01 ENCOUNTER — Encounter (HOSPITAL_COMMUNITY): Payer: Self-pay | Admitting: Internal Medicine

## 2023-07-01 ENCOUNTER — Inpatient Hospital Stay (HOSPITAL_COMMUNITY)

## 2023-07-01 DIAGNOSIS — R55 Syncope and collapse: Secondary | ICD-10-CM | POA: Diagnosis not present

## 2023-07-01 DIAGNOSIS — N179 Acute kidney failure, unspecified: Secondary | ICD-10-CM | POA: Diagnosis not present

## 2023-07-01 LAB — COMPREHENSIVE METABOLIC PANEL WITH GFR
ALT: 24 U/L (ref 0–44)
AST: 22 U/L (ref 15–41)
Albumin: 3.5 g/dL (ref 3.5–5.0)
Alkaline Phosphatase: 49 U/L (ref 38–126)
Anion gap: 7 (ref 5–15)
BUN: 25 mg/dL — ABNORMAL HIGH (ref 8–23)
CO2: 21 mmol/L — ABNORMAL LOW (ref 22–32)
Calcium: 8.3 mg/dL — ABNORMAL LOW (ref 8.9–10.3)
Chloride: 107 mmol/L (ref 98–111)
Creatinine, Ser: 1.71 mg/dL — ABNORMAL HIGH (ref 0.61–1.24)
GFR, Estimated: 42 mL/min — ABNORMAL LOW (ref >60)
Glucose, Bld: 89 mg/dL (ref 70–99)
Potassium: 4.2 mmol/L (ref 3.5–5.1)
Sodium: 135 mmol/L (ref 135–145)
Total Bilirubin: 0.5 mg/dL (ref 0.0–1.2)
Total Protein: 6.2 g/dL — ABNORMAL LOW (ref 6.5–8.1)

## 2023-07-01 LAB — ECHOCARDIOGRAM COMPLETE
AR max vel: 3.02 cm2
AV Area VTI: 2.98 cm2
AV Area mean vel: 2.89 cm2
AV Mean grad: 4 mmHg
AV Peak grad: 6.6 mmHg
Ao pk vel: 1.28 m/s
Area-P 1/2: 3.48 cm2
Est EF: 65
S' Lateral: 2.6 cm

## 2023-07-01 LAB — CBC
HCT: 29.4 % — ABNORMAL LOW (ref 39.0–52.0)
Hemoglobin: 10.2 g/dL — ABNORMAL LOW (ref 13.0–17.0)
MCH: 34 pg (ref 26.0–34.0)
MCHC: 34.7 g/dL (ref 30.0–36.0)
MCV: 98 fL (ref 80.0–100.0)
Platelets: 246 10*3/uL (ref 150–400)
RBC: 3 MIL/uL — ABNORMAL LOW (ref 4.22–5.81)
RDW: 12 % (ref 11.5–15.5)
WBC: 5 10*3/uL (ref 4.0–10.5)
nRBC: 0 % (ref 0.0–0.2)

## 2023-07-01 LAB — HEMOGLOBIN A1C
Hgb A1c MFr Bld: 5.2 % (ref 4.8–5.6)
Mean Plasma Glucose: 102.54 mg/dL

## 2023-07-01 MED ORDER — VITAMIN D (ERGOCALCIFEROL) 1.25 MG (50000 UNIT) PO CAPS
50000.0000 [IU] | ORAL_CAPSULE | ORAL | Status: DC
  Filled 2023-07-01: qty 1

## 2023-07-01 MED ORDER — TRAZODONE HCL 100 MG PO TABS
100.0000 mg | ORAL_TABLET | Freq: Every day | ORAL | Status: DC
  Administered 2023-07-02: 100 mg via ORAL
  Filled 2023-07-01 (×2): qty 1

## 2023-07-01 MED ORDER — IOHEXOL 300 MG/ML  SOLN: 30.0000 mL | Freq: Once | INTRAMUSCULAR | Status: DC | PRN

## 2023-07-01 MED ORDER — ALBUTEROL SULFATE (2.5 MG/3ML) 0.083% IN NEBU: 2.5000 mg | INHALATION_SOLUTION | Freq: Four times a day (QID) | RESPIRATORY_TRACT | Status: DC | PRN

## 2023-07-01 MED ORDER — AMLODIPINE BESYLATE 5 MG PO TABS
5.0000 mg | ORAL_TABLET | Freq: Every day | ORAL | Status: DC
  Administered 2023-07-02 – 2023-07-03 (×2): 5 mg via ORAL
  Filled 2023-07-01 (×2): qty 1

## 2023-07-01 MED ORDER — RISAQUAD PO CAPS
1.0000 | ORAL_CAPSULE | Freq: Every day | ORAL | Status: DC
  Administered 2023-07-02 – 2023-07-03 (×2): 1 via ORAL
  Filled 2023-07-01 (×2): qty 1

## 2023-07-01 MED ORDER — ROSUVASTATIN CALCIUM 10 MG PO TABS
40.0000 mg | ORAL_TABLET | Freq: Every day | ORAL | Status: DC
  Administered 2023-07-02: 40 mg via ORAL
  Filled 2023-07-01 (×2): qty 4

## 2023-07-01 NOTE — Plan of Care (Signed)
  Problem: Safety: Goal: Ability to remain free from injury will improve Outcome: Progressing   Problem: Nutrition: Goal: Adequate nutrition will be maintained Outcome: Progressing   

## 2023-07-01 NOTE — Progress Notes (Signed)
 Mobility Specialist - Progress Note   07/01/23 0935  Therapy Vitals  BP 136/78  Patient Position (if appropriate) Sitting  Mobility  Activity Ambulated with assistance in hallway  Level of Assistance Modified independent, requires aide device or extra time  Assistive Device Other (Comment) (IV Pole)  Distance Ambulated (ft) 480 ft  Activity Response Tolerated well  Mobility Referral Yes  Mobility visit 1 Mobility  Mobility Specialist Start Time (ACUTE ONLY) 0919  Mobility Specialist Stop Time (ACUTE ONLY) 0934  Mobility Specialist Time Calculation (min) (ACUTE ONLY) 15 min   Pt received in bed and agreeable to mobility. Pt took x1 seated rest break d/t feeling lightheaded. BP checked upon return and recorded above. No other complaints during session. Pt to EOB after session with all needs met.   [facility]

## 2023-07-01 NOTE — Plan of Care (Signed)
  Problem: Nutrition: Goal: Adequate nutrition will be maintained Outcome: Progressing   Problem: Safety: Goal: Ability to remain free from injury will improve Outcome: Progressing   

## 2023-07-01 NOTE — Plan of Care (Signed)

## 2023-07-01 NOTE — Progress Notes (Signed)
 Echocardiogram 2D Echocardiogram has been performed.  Kamille Toomey N Talene Glastetter,RDCS 07/01/2023, 12:28 PM

## 2023-07-01 NOTE — Progress Notes (Signed)
 PROGRESS NOTE    [name]  ZOX:[id number] DOB: [birth year] DOA: 06/30/2023 PCP: Trellis Fries, MD    Brief Narrative:  [name] is a 73 y.o. male with medical history significant of  hypertension, hyperlipidemia, MCI, obesity, right brain subcortical infarct in 2011 CHFpef, CKDIII, GERD, depression, IBS,HTN,HLD, MGUS, prediabetes Who presents from [facility] with near syncope. Per EMS in the field patient bp was 80/40. Per daughter who aides in history patient has had difficulty with light headedness for the past week. Patient notes no n/v/ but states he has chronic loose stools due to his IBS , he no real significant change in his stools or increase abdominal pain. He also denies fever/chill/ chest pain or shortness of breath.  He currently notes he's does still feel lightheaded with positional changes.    Assessment and Plan: AKI  on CKDIIIb -base cr 1.4 , 2 on admit  -Gentle IV fluids as this continues to be symptomatic with dizziness while walking -repeat orthostatic in am    Soft tissue mass in the left suprahilar region suspicious for pulmonary neoplasm - Dr. Baldwin Levee to return on Monday to discuss getting biopsy   Hypertension - Hold blood pressure medicines until orthostatics done and negative   Hyperlipidemia -resume crestor       Right brain subcortical infarct in 2011 -continue secondary ppx with plavix      CHFpef -compensated  -resume GDMT as based on bp and renal function    GERD -ppi    Depression -resume home regimen     IBS -appears having 4 loose stools per day  - CT scan of abdomen pending   MGUS -followed by oncology  On surveillance    Obesity Estimated body mass index is 31.29 kg/m as calculated from the following:   Height as of 04/06/23: 5' 7 (1.702 m).   Weight as of 04/06/23: 90.6 kg.     DVT prophylaxis: SCDs Start: 06/30/23 2340    Code Status: Full Code Family Communication: Daughter at bedside  Disposition Plan:   Level of care: Telemetry Status is: Inpatient   Consultants:  Pulmonology  Subjective: Walked with ambulatory specialist and had some dizziness needing to sit down  Objective: Vitals:   07/01/23 0008 07/01/23 0421 07/01/23 0820 07/01/23 0935  BP: (!) 133/91 114/67 (!) 138/96 136/78  Pulse:  63 (!) 58   Resp:  15 20   Temp:  98.3 F (36.8 C) 98 F (36.7 C)   TempSrc:  Oral    SpO2:  99% 98%     Intake/Output Summary (Last 24 hours) at 07/01/2023 1144 Last data filed at 06/30/2023 1941 Gross per 24 hour  Intake 500 ml  Output --  Net 500 ml   There were no vitals filed for this visit.  Examination:   General: Appearance:    Obese male in no acute distress     Lungs:      respirations unlabored  Heart:    Bradycardic.     MS:   All extremities are intact.    Neurologic:   Awake, alert       Data Reviewed: I have personally reviewed following labs and imaging studies  CBC: Recent Labs  Lab 06/30/23 2102 07/01/23 0540  WBC 5.5 5.0  NEUTROABS 3.0  --   HGB 10.6* 10.2*  HCT 31.5* 29.4*  MCV 99.1 98.0  PLT 259 246   Basic Metabolic Panel: Recent Labs  Lab 06/30/23 2102 07/01/23 0540  NA 133* 135  K 4.2 4.2  CL 103 107  CO2 22 21*  GLUCOSE 88 89  BUN 28* 25*  CREATININE 2.07* 1.71*  CALCIUM  8.1* 8.3*   GFR: CrCl cannot be calculated (Unknown ideal weight.). Liver Function Tests: Recent Labs  Lab 06/30/23 2102 07/01/23 0540  AST 24 22  ALT 23 24  ALKPHOS 48 49  BILITOT 0.7 0.5  PROT 6.6 6.2*  ALBUMIN 3.6 3.5   No results for input(s): LIPASE, AMYLASE in the last 168 hours. No results for input(s): AMMONIA in the last 168 hours. Coagulation Profile: No results for input(s): INR, PROTIME in the last 168 hours. Cardiac Enzymes: No results for input(s): CKTOTAL, CKMB, CKMBINDEX, TROPONINI in the last 168 hours. BNP (last 3 results) No results for input(s): PROBNP in the last 8760 hours. HbA1C: Recent Labs     07/01/23 0540  HGBA1C 5.2   CBG: No results for input(s): GLUCAP in the last 168 hours. Lipid Profile: No results for input(s): CHOL, HDL, LDLCALC, TRIG, CHOLHDL, LDLDIRECT in the last 72 hours. Thyroid  Function Tests: No results for input(s): TSH, T4TOTAL, FREET4, T3FREE, THYROIDAB in the last 72 hours. Anemia Panel: No results for input(s): VITAMINB12, FOLATE, FERRITIN, TIBC, IRON, RETICCTPCT in the last 72 hours. Sepsis Labs: No results for input(s): PROCALCITON, LATICACIDVEN in the last 168 hours.  No results found for this or any previous visit (from the past 240 hours).       Radiology Studies: CT Chest Wo Contrast Result Date: 06/30/2023 CLINICAL DATA:  Follow-up left suprahilar density EXAM: CT CHEST WITHOUT CONTRAST TECHNIQUE: Multidetector CT imaging of the chest was performed following the standard protocol without IV contrast. RADIATION DOSE REDUCTION: This exam was performed according to the departmental dose-optimization program which includes automated exposure control, adjustment of the mA and/or kV according to patient size and/or use of iterative reconstruction technique. COMPARISON:  Chest x-ray from earlier in the same day. FINDINGS: Cardiovascular: Somewhat limited due to lack of IV contrast. Atherosclerotic calcifications are noted. No aneurysmal dilatation is seen. No cardiac enlargement is noted. Mediastinum/Nodes: Thoracic inlet is within normal limits. No hilar or mediastinal adenopathy is noted. The esophagus as visualized is within normal limits. Lungs/Pleura: Right lung is well aerated. No focal infiltrate is seen. The left lung demonstrates a soft tissue mass lesion in the left suprahilar region which measures 4.5 x 5.1 cm in greatest AP and transverse dimensions respectively. This is highly suspicious for pulmonary neoplasm and further workup is recommended. Area of apparent cavitation is noted adjacent to the mass lesion.  No other nodular changes are seen. Upper Abdomen: Visualized upper abdomen shows a cystic lesion within the left lobe of the liver. No other focal abnormality is noted. Musculoskeletal: Degenerative changes of the thoracic spine are seen. No acute rib abnormality is noted. IMPRESSION: Soft tissue mass in the left suprahilar region suspicious for pulmonary neoplasm. Per Fleischner Society Guidelines, recommend a PET/CT or tissue sampling. These guidelines do not apply to immunocompromised patients and patients with cancer. Follow up in patients with significant comorbidities as clinically warranted. For lung cancer screening, adhere to Lung-RADS guidelines. Reference: Radiology. 2017; 284(1):228-43. Aortic Atherosclerosis (ICD10-I70.0). Electronically Signed   By: Violeta Grey M.D.   On: 06/30/2023 22:48   CT Head Wo Contrast Result Date: 06/30/2023 CLINICAL DATA:  Mental status change, unknown cause. Lightheadedness and dizziness while playing golf. Orthostatic hypotension. EXAM: CT HEAD WITHOUT CONTRAST TECHNIQUE: Contiguous axial images were obtained from the base of the skull through the vertex without intravenous  contrast. RADIATION DOSE REDUCTION: This exam was performed according to the departmental dose-optimization program which includes automated exposure control, adjustment of the mA and/or kV according to patient size and/or use of iterative reconstruction technique. COMPARISON:  Head CT and MRI 09/11/2022 FINDINGS: Brain: There is no evidence of an acute infarct, intracranial hemorrhage, mass, midline shift, or extra-axial fluid collection. Mild cerebral atrophy is within normal limits for age. The ventricles are normal in size. Age advanced chronic white matter disease predominantly involving the periventricular white matter is similar to the prior CT. Vascular: No hyperdense vessel. Skull: No fracture or suspicious lesion. Sinuses/Orbits: Partial opacification of an anterior right ethmoid air cell.  Clear mastoid air cells. Unremarkable orbits. Other: None. IMPRESSION: 1. No evidence of acute intracranial abnormality. 2. Age advanced chronic white matter disease, stable and nonspecific. Electronically Signed   By: Aundra Lee M.D.   On: 06/30/2023 19:33   DG Chest Port 1 View Result Date: 06/30/2023 CLINICAL DATA:  Syncope. EXAM: PORTABLE CHEST 1 VIEW COMPARISON:  Radiograph 04/25/2022 FINDINGS: Normal cardiac silhouette. LEFT suprahilar density measuring 5.5 x 4.8 cm. No pleural fluid. No pneumothorax. IMPRESSION: LEFT suprahilar density representing pneumonia versus pulmonary mass. Recommend CT thorax with contrast for further evaluation. Electronically Signed   By: Deboraha Fallow M.D.   On: 06/30/2023 18:30        Scheduled Meds:  acidophilus  1 capsule Oral Daily   allopurinol  150 mg Oral Daily   amLODipine   5 mg Oral Daily   busPIRone   30 mg Oral BID   clopidogrel   75 mg Oral Daily   folic acid   1 mg Oral Daily   hydroxychloroquine  400 mg Oral QHS   PARoxetine   37.5 mg Oral QHS   primidone   250 mg Oral BID   QUEtiapine   25 mg Oral QHS   rosuvastatin   40 mg Oral Daily   traZODone   100 mg Oral QHS   valACYclovir  1,000 mg Oral Daily   Vitamin D (Ergocalciferol)  50,000 Units Oral Q Sat   Continuous Infusions:  sodium chloride  75 mL/hr at 06/30/23 2331     LOS: 1 day    Time spent: 45 minutes spent on chart review, discussion with nursing staff, consultants, updating family and interview/physical exam; more than 50% of that time was spent in counseling and/or coordination of care.    Enrigue Harvard, DO [facility] Available via Epic secure chat 7am-7pm After these hours, please refer to coverage provider listed on [email or web address] 07/01/2023, 11:44 AM

## 2023-07-02 DIAGNOSIS — E785 Hyperlipidemia, unspecified: Secondary | ICD-10-CM

## 2023-07-02 DIAGNOSIS — I10 Essential (primary) hypertension: Secondary | ICD-10-CM

## 2023-07-02 DIAGNOSIS — K219 Gastro-esophageal reflux disease without esophagitis: Secondary | ICD-10-CM

## 2023-07-02 DIAGNOSIS — N183 Chronic kidney disease, stage 3 unspecified: Secondary | ICD-10-CM

## 2023-07-02 DIAGNOSIS — N179 Acute kidney failure, unspecified: Secondary | ICD-10-CM | POA: Diagnosis not present

## 2023-07-02 DIAGNOSIS — I5030 Unspecified diastolic (congestive) heart failure: Secondary | ICD-10-CM

## 2023-07-02 LAB — CBC
HCT: 32 % — ABNORMAL LOW (ref 39.0–52.0)
Hemoglobin: 10.9 g/dL — ABNORMAL LOW (ref 13.0–17.0)
MCH: 33.5 pg (ref 26.0–34.0)
MCHC: 34.1 g/dL (ref 30.0–36.0)
MCV: 98.5 fL (ref 80.0–100.0)
Platelets: 296 10*3/uL (ref 150–400)
RBC: 3.25 MIL/uL — ABNORMAL LOW (ref 4.22–5.81)
RDW: 12.2 % (ref 11.5–15.5)
WBC: 5.4 10*3/uL (ref 4.0–10.5)
nRBC: 0 % (ref 0.0–0.2)

## 2023-07-02 LAB — BASIC METABOLIC PANEL WITH GFR
Anion gap: 10 (ref 5–15)
BUN: 19 mg/dL (ref 8–23)
CO2: 19 mmol/L — ABNORMAL LOW (ref 22–32)
Calcium: 8.8 mg/dL — ABNORMAL LOW (ref 8.9–10.3)
Chloride: 106 mmol/L (ref 98–111)
Creatinine, Ser: 1.38 mg/dL — ABNORMAL HIGH (ref 0.61–1.24)
GFR, Estimated: 55 mL/min — ABNORMAL LOW (ref >60)
Glucose, Bld: 97 mg/dL (ref 70–99)
Potassium: 4 mmol/L (ref 3.5–5.1)
Sodium: 135 mmol/L (ref 135–145)

## 2023-07-02 MED ORDER — LORATADINE 10 MG PO TABS
10.0000 mg | ORAL_TABLET | Freq: Every day | ORAL | Status: DC
  Administered 2023-07-02 – 2023-07-03 (×2): 10 mg via ORAL
  Filled 2023-07-02 (×2): qty 1

## 2023-07-02 NOTE — Plan of Care (Signed)
  Problem: Nutrition: Goal: Adequate nutrition will be maintained Outcome: Progressing   Problem: Safety: Goal: Ability to remain free from injury will improve Outcome: Progressing   

## 2023-07-02 NOTE — Consult Note (Signed)
   NAME:  [name], MRN:  [id number], DOB:  [birth year], LOS: 2 ADMISSION DATE:  06/30/2023, CONSULTATION DATE:  07/02/23 REFERRING MD:  Dr Mikle Alexanders, CHIEF COMPLAINT: Abnormal CT scan of the chest  History of Present Illness:  Asked to see patient for abnormal CT scan showing a left upper lobe mass lesion  He has had a cough on and off, cough is dry, no chest pain, no hemoptysis Follows up with a pulmonologist at [facility]. [name] - Was last seen about July 2024-was doing well at the time - Had a CT scan, was scheduled for CT scan in January 2025 - I did call [name] he had a CAT scan done in July 2024  History of hypertension, right brain subcortical infarct in 2011, MGUS  Presented to the hospital from [facility] [place] with near syncope, blood pressure was low at presentation  Pertinent  Medical History   Past Medical History:  Diagnosis Date   (HFpEF) heart failure with preserved ejection fraction (HCC)    Allergy    Anxiety    Arthritis    Chronic kidney disease    Depression    Frequent urination    GERD (gastroesophageal reflux disease)    Gout    Headache    mirgaines in the past   Herpes    History of nuclear stress test    Myoview  05/2021: EF 77, normal perfusion, low risk   Hyperlipidemia    Hypertension    IBS (irritable bowel syndrome)    Memory loss    MGUS (monoclonal gammopathy of unknown significance)    Obesity    Pre-diabetes    Stroke [facility])    Urgency of urination    Significant Hospital Events: Including procedures, antibiotic start and stop dates in addition to other pertinent events   06/30/2023-CT chest 4.5 x 5.1 suprahilar mass lesion  Interim History / Subjective:  Does not appear to be in pain or discomfort Not really having any cough  Objective    Blood pressure 132/64, pulse 66, temperature 98.3 F (36.8 C), temperature source Oral, resp. rate 17, SpO2 96%.        Intake/Output Summary (Last 24  hours) at 07/02/2023 1333 Last data filed at 07/01/2023 2208 Gross per 24 hour  Intake 240 ml  Output --  Net 240 ml   There were no vitals filed for this visit.  Examination: General: Elderly, does appear comfortable HENT: Moist oral mucosa Lungs: Clear breath sounds Cardiovascular: S1-S2 appreciated Abdomen: Soft, bowel sounds appreciated Extremities: No clubbing, no edema   Resolved problem list   Assessment and Plan  Left upper lobe mass lesion - Will be important to obtain results of recent CT - May require a PET scan prior to bronchoscopic biopsy - Concern remains a neoplastic process in the left upper lobe  MGUS Acute kidney injury on chronic kidney disease stage III Hypertension Hyperlipidemia Heart failure with preserved ejection fraction GERD  Likely requires navigational bronchoscopy-will discuss with Dr. Monita Anon, MD [place] [facility] Pager: See [name]

## 2023-07-02 NOTE — Plan of Care (Signed)

## 2023-07-02 NOTE — Progress Notes (Signed)
 PROGRESS NOTE    [name]  MVH:[id number] DOB: [birth year] DOA: 06/30/2023 PCP: Trellis Fries, MD    Brief Narrative:  [name] is a 73 y.o. male with medical history significant of  hypertension, hyperlipidemia, MCI, obesity, right brain subcortical infarct in 2011 CHFpef, CKDIII, GERD, depression, IBS,HTN,HLD, MGUS, prediabetes Who presents from [facility] with near syncope. Per EMS in the field patient bp was 80/40. Per daughter who aides in history patient has had difficulty with light headedness for the past week. Patient notes no n/v/ but states he has chronic loose stools due to his IBS , he no real significant change in his stools or increase abdominal pain. He also denies fever/chill/ chest pain or shortness of breath.  He currently notes he's does still feel lightheaded with positional changes.    Assessment and Plan: AKI  on CKDIIIb -base cr 1.4 , 2 on admit  -Creatinine improved -repeat orthostatic negative -Continue to ambulate patient   Soft tissue mass in the left suprahilar region suspicious for pulmonary neoplasm - Dr. Baldwin Levee to return on Monday to discuss getting biopsy - Requested any prior CT scans of the chest from [facility] medical   Hypertension - Hold blood pressure medicines until orthostatics done and negative   Hyperlipidemia -resume crestor       Right brain subcortical infarct in 2011 -continue secondary ppx with plavix      CHFpef -compensated  -resume GDMT as based on bp and renal function    GERD -ppi    Depression -resume home regimen     IBS -appears having 4 loose stools per day  - CT scan of abdomen unremarkable - Added lactobacillus   MGUS -followed by oncology  On surveillance    Obesity Estimated body mass index is 31.29 kg/m as calculated from the following:   Height as of 04/06/23: 5' 7 (1.702 m).   Weight as of 04/06/23: 90.6 kg.     DVT prophylaxis: SCDs Start: 06/30/23 2340    Code Status: Full  Code Family Communication: Daughter at bedside  Disposition Plan:  Level of care: Med-Surg Status is: Inpatient   Consultants:  Pulmonology  Subjective: Has not been out of bed yet today but is complaining of being cold  Objective: Vitals:   07/01/23 2034 07/02/23 0531 07/02/23 0907 07/02/23 1246  BP: (!) 164/90 105/62 119/70 132/64  Pulse: 60 (!) 59 (!) 59 66  Resp: 16 18  17   Temp: 98.1 F (36.7 C) 98 F (36.7 C) 98 F (36.7 C) 98.3 F (36.8 C)  TempSrc: Oral Oral Oral Oral  SpO2: 96% 96% 96% 96%    Intake/Output Summary (Last 24 hours) at 07/02/2023 1301 Last data filed at 07/01/2023 2208 Gross per 24 hour  Intake 240 ml  Output --  Net 240 ml   There were no vitals filed for this visit.  Examination:   General: Appearance:    Obese male in no acute distress     Lungs:      respirations unlabored  Heart:    Normal heart rate.     MS:   All extremities are intact.    Neurologic:   Awake, alert       Data Reviewed: I have personally reviewed following labs and imaging studies  CBC: Recent Labs  Lab 06/30/23 2102 07/01/23 0540 07/02/23 0544  WBC 5.5 5.0 5.4  NEUTROABS 3.0  --   --   HGB 10.6* 10.2* 10.9*  HCT 31.5* 29.4* 32.0*  MCV  99.1 98.0 98.5  PLT 259 246 296   Basic Metabolic Panel: Recent Labs  Lab 06/30/23 2102 07/01/23 0540 07/02/23 0544  NA 133* 135 135  K 4.2 4.2 4.0  CL 103 107 106  CO2 22 21* 19*  GLUCOSE 88 89 97  BUN 28* 25* 19  CREATININE 2.07* 1.71* 1.38*  CALCIUM  8.1* 8.3* 8.8*   GFR: CrCl cannot be calculated (Unknown ideal weight.). Liver Function Tests: Recent Labs  Lab 06/30/23 2102 07/01/23 0540  AST 24 22  ALT 23 24  ALKPHOS 48 49  BILITOT 0.7 0.5  PROT 6.6 6.2*  ALBUMIN 3.6 3.5   No results for input(s): LIPASE, AMYLASE in the last 168 hours. No results for input(s): AMMONIA in the last 168 hours. Coagulation Profile: No results for input(s): INR, PROTIME in the last 168 hours. Cardiac  Enzymes: No results for input(s): CKTOTAL, CKMB, CKMBINDEX, TROPONINI in the last 168 hours. BNP (last 3 results) No results for input(s): PROBNP in the last 8760 hours. HbA1C: Recent Labs    07/01/23 0540  HGBA1C 5.2   CBG: No results for input(s): GLUCAP in the last 168 hours. Lipid Profile: No results for input(s): CHOL, HDL, LDLCALC, TRIG, CHOLHDL, LDLDIRECT in the last 72 hours. Thyroid  Function Tests: No results for input(s): TSH, T4TOTAL, FREET4, T3FREE, THYROIDAB in the last 72 hours. Anemia Panel: No results for input(s): VITAMINB12, FOLATE, FERRITIN, TIBC, IRON, RETICCTPCT in the last 72 hours. Sepsis Labs: No results for input(s): PROCALCITON, LATICACIDVEN in the last 168 hours.  No results found for this or any previous visit (from the past 240 hours).       Radiology Studies: CT ABDOMEN PELVIS WO CONTRAST Result Date: 07/01/2023 CLINICAL DATA:  Clinical concern for occult malignancy. EXAM: CT ABDOMEN AND PELVIS WITHOUT CONTRAST TECHNIQUE: Multidetector CT imaging of the abdomen and pelvis was performed following the standard protocol without IV contrast. RADIATION DOSE REDUCTION: This exam was performed according to the departmental dose-optimization program which includes automated exposure control, adjustment of the mA and/or kV according to patient size and/or use of iterative reconstruction technique. COMPARISON:  06/30/2023, 12/16/2019. FINDINGS: Lower chest: No acute abnormality. Hepatobiliary: Multiple scattered hypodensities are present in the liver, large is measuring 3.6 cm in the left lobe is cystic in attenuation. No biliary ductal dilatation is seen. The gallbladder is surgically absent. Pancreas: Unremarkable. No pancreatic ductal dilatation or surrounding inflammatory changes. Spleen: Normal in size without focal abnormality. Adrenals/Urinary Tract: The adrenal glands are within normal limits. Cysts are present  in the kidneys bilaterally. No renal calculus or hydronephrosis bilaterally. The bladder is unremarkable. Stomach/Bowel: There is a small hiatal hernia. The stomach is otherwise within normal limits. No bowel obstruction, free air, or pneumatosis is seen. Scattered diverticular present along the colon without evidence of diverticulitis. Vascular/Lymphatic: Aortic atherosclerosis. No enlarged abdominal or pelvic lymph nodes. Reproductive: The prostate gland is borderline enlarged. Other: No abdominopelvic ascites. Musculoskeletal: Degenerative changes are present in the thoracolumbar spine. Anterior spinal fusion hardware is noted from L5-S1. No suspicious osseous abnormality is seen. IMPRESSION: 1. No evidence of focal mass or metastatic disease. 2. Hepatic and renal cysts. 3. Diverticulosis without diverticulitis. 4. Small hiatal hernia. 5. Aortic atherosclerosis. Electronically Signed   By: Wyvonnia Heimlich M.D.   On: 07/01/2023 16:21   ECHOCARDIOGRAM COMPLETE Result Date: 07/01/2023    ECHOCARDIOGRAM REPORT   Patient Name:   [name] Date of Exam: 07/01/2023 Medical Rec #:  [id number]  Height:       67.0 in Accession #:    [id number]      Weight:       199.8 lb Date of Birth:  May 05, 1950       BSA:          2.021 m Patient Age:    73 years        BP:           114/67 mmHg Patient Gender: M               HR:           58 bpm. Exam Location:  Inpatient Procedure: 2D Echo, 3D Echo, Color Doppler, Cardiac Doppler and Strain Analysis            (Both Spectral and Color Flow Doppler were utilized during            procedure). Indications:    Syncope  History:        Patient has prior history of Echocardiogram examinations, most                 recent 12/15/2019. CHF, Stroke; Risk Factors:Hypertension,                 Dyslipidemia, Former Smoker and CKD. GERD.  Sonographer:    Travis Friedman RDCS Referring Phys: [id number] SARA-MAIZ A THOMAS IMPRESSIONS  1. Left ventricular ejection fraction, by estimation, is 65%.  Left ventricular ejection fraction by 3D volume is 66 %. The left ventricle has normal function. The left ventricle has no regional wall motion abnormalities. There is mild left ventricular hypertrophy of the inferior and septal segments. Left ventricular diastolic parameters are consistent with Grade II diastolic dysfunction (pseudonormalization). The average left ventricular global longitudinal strain is -22.0 %. The global longitudinal strain is normal.  2. Right ventricular systolic function is normal. The right ventricular size is mildly enlarged. Mildly increased right ventricular wall thickness.  3. Left atrial size was severely dilated.  4. The mitral valve is normal in structure. No evidence of mitral valve regurgitation. No evidence of mitral stenosis.  5. The aortic valve is tricuspid. There is mild calcification of the aortic valve. Aortic valve regurgitation is not visualized. Aortic valve sclerosis is present, with no evidence of aortic valve stenosis.  6. The inferior vena cava is normal in size with greater than 50% respiratory variability, suggesting right atrial pressure of 3 mmHg. Comparison(s): Prior images reviewed side by side. Left ventricular function is slightly less vigorous. FINDINGS  Left Ventricle: Left ventricular ejection fraction, by estimation, is 65%. Left ventricular ejection fraction by 3D volume is 66 %. The left ventricle has normal function. The left ventricle has no regional wall motion abnormalities. The average left ventricular global longitudinal strain is -22.0 %. Strain was performed and the global longitudinal strain is normal. The left ventricular internal cavity size was normal in size. There is mild left ventricular hypertrophy of the inferior and septal segments. Left ventricular diastolic parameters are consistent with Grade II diastolic dysfunction (pseudonormalization). Right Ventricle: The right ventricular size is mildly enlarged. Mildly increased right  ventricular wall thickness. Right ventricular systolic function is normal. Left Atrium: Left atrial size was severely dilated. Right Atrium: Right atrial size was normal in size. Pericardium: There is no evidence of pericardial effusion. Mitral Valve: The mitral valve is normal in structure. No evidence of mitral valve regurgitation. No evidence of mitral valve stenosis. Tricuspid Valve: The tricuspid valve is normal in  structure. Tricuspid valve regurgitation is not demonstrated. No evidence of tricuspid stenosis. Aortic Valve: The aortic valve is tricuspid. There is mild calcification of the aortic valve. Aortic valve regurgitation is not visualized. Aortic valve sclerosis is present, with no evidence of aortic valve stenosis. Aortic valve mean gradient measures 4.0 mmHg. Aortic valve peak gradient measures 6.6 mmHg. Aortic valve area, by VTI measures 2.98 cm. Pulmonic Valve: The pulmonic valve was normal in structure. Pulmonic valve regurgitation is not visualized. No evidence of pulmonic stenosis. Aorta: The aortic root and ascending aorta are structurally normal, with no evidence of dilitation. Venous: The inferior vena cava is normal in size with greater than 50% respiratory variability, suggesting right atrial pressure of 3 mmHg. IAS/Shunts: The atrial septum is grossly normal. Additional Comments: 3D was performed not requiring image post processing on an independent workstation and was normal.  LEFT VENTRICLE PLAX 2D LVIDd:         4.00 cm         Diastology LVIDs:         2.60 cm         LV e' medial:    5.77 cm/s LV PW:         0.80 cm         LV E/e' medial:  16.9 LV IVS:        1.40 cm         LV e' lateral:   6.85 cm/s LVOT diam:     2.00 cm         LV E/e' lateral: 14.2 LV SV:         90 LV SV Index:   44              2D Longitudinal LVOT Area:     3.14 cm        Strain                                2D Strain GLS   -22.0 %                                Avg:                                 3D  Volume EF                                LV 3D EF:    Left                                             ventricul                                             ar                                             ejection  fraction                                             by 3D                                             volume is                                             66 %.                                 3D Volume EF:                                3D EF:        66 %                                LV EDV:       115 ml                                LV ESV:       39 ml                                LV SV:        76 ml RIGHT VENTRICLE             IVC RV Basal diam:  4.20 cm     IVC diam: 1.30 cm RV Mid diam:    2.40 cm RV S prime:     11.70 cm/s TAPSE (M-mode): 2.7 cm LEFT ATRIUM              Index        RIGHT ATRIUM           Index LA diam:        4.60 cm  2.28 cm/m   RA Area:     17.80 cm LA Vol (A2C):   97.4 ml  48.18 ml/m  RA Volume:   54.00 ml  26.71 ml/m LA Vol (A4C):   105.0 ml 51.94 ml/m LA Biplane Vol: 102.0 ml 50.46 ml/m  AORTIC VALVE AV Area (Vmax):    3.02 cm AV Area (Vmean):   2.89 cm AV Area (VTI):     2.98 cm AV Vmax:           128.00 cm/s AV Vmean:          87.500 cm/s AV VTI:            0.300 m AV Peak Grad:      6.6 mmHg AV Mean Grad:      4.0 mmHg LVOT Vmax:         123.00 cm/s LVOT Vmean:        80.600 cm/s LVOT VTI:  0.285 m LVOT/AV VTI ratio: 0.95  AORTA Ao Root diam: 3.10 cm Ao Asc diam:  3.40 cm MITRAL VALVE               TRICUSPID VALVE MV Area (PHT): 3.48 cm    TR Peak grad:   30.9 mmHg MV Decel Time: 218 msec    TR Vmax:        278.00 cm/s MV E velocity: 97.40 cm/s MV A velocity: 98.10 cm/s  SHUNTS MV E/A ratio:  0.99        Systemic VTI:  0.29 m                            Systemic Diam: 2.00 cm Gloriann Larger MD Electronically signed by Gloriann Larger MD Signature Date/Time: 07/01/2023/1:11:08 PM    Final    CT  Chest Wo Contrast Result Date: 06/30/2023 CLINICAL DATA:  Follow-up left suprahilar density EXAM: CT CHEST WITHOUT CONTRAST TECHNIQUE: Multidetector CT imaging of the chest was performed following the standard protocol without IV contrast. RADIATION DOSE REDUCTION: This exam was performed according to the departmental dose-optimization program which includes automated exposure control, adjustment of the mA and/or kV according to patient size and/or use of iterative reconstruction technique. COMPARISON:  Chest x-ray from earlier in the same day. FINDINGS: Cardiovascular: Somewhat limited due to lack of IV contrast. Atherosclerotic calcifications are noted. No aneurysmal dilatation is seen. No cardiac enlargement is noted. Mediastinum/Nodes: Thoracic inlet is within normal limits. No hilar or mediastinal adenopathy is noted. The esophagus as visualized is within normal limits. Lungs/Pleura: Right lung is well aerated. No focal infiltrate is seen. The left lung demonstrates a soft tissue mass lesion in the left suprahilar region which measures 4.5 x 5.1 cm in greatest AP and transverse dimensions respectively. This is highly suspicious for pulmonary neoplasm and further workup is recommended. Area of apparent cavitation is noted adjacent to the mass lesion. No other nodular changes are seen. Upper Abdomen: Visualized upper abdomen shows a cystic lesion within the left lobe of the liver. No other focal abnormality is noted. Musculoskeletal: Degenerative changes of the thoracic spine are seen. No acute rib abnormality is noted. IMPRESSION: Soft tissue mass in the left suprahilar region suspicious for pulmonary neoplasm. Per Fleischner Society Guidelines, recommend a PET/CT or tissue sampling. These guidelines do not apply to immunocompromised patients and patients with cancer. Follow up in patients with significant comorbidities as clinically warranted. For lung cancer screening, adhere to Lung-RADS guidelines.  Reference: Radiology. 2017; 284(1):228-43. Aortic Atherosclerosis (ICD10-I70.0). Electronically Signed   By: Violeta Grey M.D.   On: 06/30/2023 22:48   CT Head Wo Contrast Result Date: 06/30/2023 CLINICAL DATA:  Mental status change, unknown cause. Lightheadedness and dizziness while playing golf. Orthostatic hypotension. EXAM: CT HEAD WITHOUT CONTRAST TECHNIQUE: Contiguous axial images were obtained from the base of the skull through the vertex without intravenous contrast. RADIATION DOSE REDUCTION: This exam was performed according to the departmental dose-optimization program which includes automated exposure control, adjustment of the mA and/or kV according to patient size and/or use of iterative reconstruction technique. COMPARISON:  Head CT and MRI 09/11/2022 FINDINGS: Brain: There is no evidence of an acute infarct, intracranial hemorrhage, mass, midline shift, or extra-axial fluid collection. Mild cerebral atrophy is within normal limits for age. The ventricles are normal in size. Age advanced chronic white matter disease predominantly involving the periventricular white matter is similar to the prior CT. Vascular: No hyperdense  vessel. Skull: No fracture or suspicious lesion. Sinuses/Orbits: Partial opacification of an anterior right ethmoid air cell. Clear mastoid air cells. Unremarkable orbits. Other: None. IMPRESSION: 1. No evidence of acute intracranial abnormality. 2. Age advanced chronic white matter disease, stable and nonspecific. Electronically Signed   By: Aundra Lee M.D.   On: 06/30/2023 19:33   DG Chest Port 1 View Result Date: 06/30/2023 CLINICAL DATA:  Syncope. EXAM: PORTABLE CHEST 1 VIEW COMPARISON:  Radiograph 04/25/2022 FINDINGS: Normal cardiac silhouette. LEFT suprahilar density measuring 5.5 x 4.8 cm. No pleural fluid. No pneumothorax. IMPRESSION: LEFT suprahilar density representing pneumonia versus pulmonary mass. Recommend CT thorax with contrast for further evaluation.  Electronically Signed   By: Deboraha Fallow M.D.   On: 06/30/2023 18:30        Scheduled Meds:  acidophilus  1 capsule Oral Daily   allopurinol  150 mg Oral Daily   amLODipine   5 mg Oral Daily   busPIRone   30 mg Oral BID   clopidogrel   75 mg Oral Daily   folic acid   1 mg Oral Daily   hydroxychloroquine  400 mg Oral QHS   loratadine  10 mg Oral Daily   PARoxetine   37.5 mg Oral QHS   primidone   250 mg Oral BID   QUEtiapine   25 mg Oral QHS   rosuvastatin   40 mg Oral QHS   traZODone   100 mg Oral QHS   valACYclovir  1,000 mg Oral Daily   Vitamin D (Ergocalciferol)  50,000 Units Oral Q Sat   Continuous Infusions:     LOS: 2 days    Time spent: 45 minutes spent on chart review, discussion with nursing staff, consultants, updating family and interview/physical exam; more than 50% of that time was spent in counseling and/or coordination of care.    Enrigue Harvard, DO [facility] Available via Epic secure chat 7am-7pm After these hours, please refer to coverage provider listed on [email or web address] 07/02/2023, 1:01 PM

## 2023-07-03 ENCOUNTER — Other Ambulatory Visit (HOSPITAL_COMMUNITY): Payer: Self-pay

## 2023-07-03 ENCOUNTER — Other Ambulatory Visit: Payer: Self-pay | Admitting: Pulmonary Disease

## 2023-07-03 ENCOUNTER — Telehealth: Payer: Self-pay | Admitting: Pulmonary Disease

## 2023-07-03 DIAGNOSIS — N179 Acute kidney failure, unspecified: Secondary | ICD-10-CM | POA: Diagnosis not present

## 2023-07-03 DIAGNOSIS — C3412 Malignant neoplasm of upper lobe, left bronchus or lung: Secondary | ICD-10-CM

## 2023-07-03 DIAGNOSIS — J984 Other disorders of lung: Secondary | ICD-10-CM

## 2023-07-03 LAB — CBC
HCT: 30.6 % — ABNORMAL LOW (ref 39.0–52.0)
Hemoglobin: 10.4 g/dL — ABNORMAL LOW (ref 13.0–17.0)
MCH: 33.8 pg (ref 26.0–34.0)
MCHC: 34 g/dL (ref 30.0–36.0)
MCV: 99.4 fL (ref 80.0–100.0)
Platelets: 242 10*3/uL (ref 150–400)
RBC: 3.08 MIL/uL — ABNORMAL LOW (ref 4.22–5.81)
RDW: 12.2 % (ref 11.5–15.5)
WBC: 5 10*3/uL (ref 4.0–10.5)
nRBC: 0 % (ref 0.0–0.2)

## 2023-07-03 LAB — BASIC METABOLIC PANEL WITH GFR
Anion gap: 7 (ref 5–15)
BUN: 14 mg/dL (ref 8–23)
CO2: 21 mmol/L — ABNORMAL LOW (ref 22–32)
Calcium: 8.5 mg/dL — ABNORMAL LOW (ref 8.9–10.3)
Chloride: 107 mmol/L (ref 98–111)
Creatinine, Ser: 1.35 mg/dL — ABNORMAL HIGH (ref 0.61–1.24)
GFR, Estimated: 56 mL/min — ABNORMAL LOW (ref >60)
Glucose, Bld: 100 mg/dL — ABNORMAL HIGH (ref 70–99)
Potassium: 3.9 mmol/L (ref 3.5–5.1)
Sodium: 135 mmol/L (ref 135–145)

## 2023-07-03 MED ORDER — ACIDOPHILUS PO CAPS
1.0000 | ORAL_CAPSULE | Freq: Every day | ORAL | 0 refills | Status: AC
  Filled 2023-07-03: qty 100, 100d supply, fill #0
  Filled 2023-07-03: qty 60, 60d supply, fill #0

## 2023-07-03 MED ORDER — FUROSEMIDE 40 MG PO TABS
40.0000 mg | ORAL_TABLET | Freq: Every day | ORAL | Status: DC | PRN
Start: 2023-07-03 — End: 2023-07-13

## 2023-07-03 NOTE — Progress Notes (Signed)
 Mobility Specialist - Progress Note   07/03/23 0918  Mobility  Activity Ambulated independently in hallway  Level of Assistance Independent  Assistive Device None  Distance Ambulated (ft) 500 ft  Activity Response Tolerated well  Mobility Referral Yes  Mobility visit 1 Mobility  Mobility Specialist Stop Time (ACUTE ONLY) 0918   Pt received in bed and agreeable to mobility. No complaints during session. Pt to bed after session with all needs met.    [facility]

## 2023-07-03 NOTE — Progress Notes (Addendum)
 Was able to get a copy of his last CT performed in July 2024 - in Media tab, 07/03/23  CT shows a negative lung cancer screening, slight progression of probable left upper lobe scarring  This suggests the left upper lobe process is a new  Best plan going forward be to get a PET scan and plan for outpatient pulmonary follow-up

## 2023-07-03 NOTE — Discharge Summary (Signed)
 Physician Discharge Summary  [name] ZOX:[id number] DOB: 05-Jan-1951 DOA: 06/30/2023  PCP: Trellis Fries, MD  Admit date: 06/30/2023 Discharge date: 07/03/2023  Admitted From: Home/ILF Discharge disposition: Home/ILF   Recommendations for Outpatient Follow-Up:   Pulmonology to arrange an outpatient PET scan and then make plans for biopsy if needed Blood pressure medicines are being held except for Norvasc  and Lasix  has been changed to as needed-please check BMP in 1 week and blood pressure and resume blood pressure meds as needed    Discharge Diagnosis:   Principal Problem:   AKI (acute kidney injury) (HCC)    Discharge Condition: Improved.  Diet recommendation: Low sodium, heart healthy  Wound care: None.  Code status: Full.   History of Present Illness:   [name] is a 73 y.o. male with medical history significant of  hypertension, hyperlipidemia, MCI, obesity, right brain subcortical infarct in 2011 CHFpef, CKDIII, GERD, depression, IBS,HTN,HLD, MGUS, prediabetes Who presents from [facility] with near syncope. Per EMS in the field patient bp was 80/40. Per daughter who aides in history patient has had difficulty with light headedness for the past week. Patient notes no n/v/ but states he has chronic loose stools due to his IBS , he no real significant change in his stools or increase abdominal pain. He also denies fever/chill/ chest pain or shortness of breath.  He currently notes he's does still feel lightheaded with positional changes.  ED Course:  IN ED vitals  note patient to be orthostatic Orthostatics Standing BP 114/60 Sitting 88/56 RR 16 HR 60 100 on RA Cxr : IMPRESSION: LEFT suprahilar density representing pneumonia versus pulmonary mass. Recommend CT thorax with contrast for further evaluation.   Hospital Course by Problem:   AKI  on CKDIIIb -base cr 1.4 , 2 on admit  -Creatinine improved --Echo done with diastolic  dysfunction but preserved EF -repeat orthostatic negative - Ambulated patient and he had no symptoms   Soft tissue mass in the left suprahilar region suspicious for pulmonary neoplasm - Per pulmonary:Was able to get a copy of his last CT performed in July 2024 - in Media tab, 07/03/23   CT shows a negative lung cancer screening, slight progression of probable left upper lobe scarring   This suggests the left upper lobe process is a new   Best plan going forward be to get a PET scan and plan for outpatient pulmonary follow-up   Hypertension - Holding all blood pressure medicines except Norvasc  and have changed Lasix  to as needed - Blood pressure well-controlled but will need close monitoring in the outpatient setting   Hyperlipidemia -resume crestor       Right brain subcortical infarct in 2011 -continue secondary ppx with plavix      CHFpef -compensated  -Echo done and see above -resume GDMT as based on bp and renal function-have held for now and ask PCP to resume as able   GERD -ppi    Depression -resume home regimen     IBS -appears having 4 loose stools per day  - CT scan of abdomen unremarkable - Added lactobacillus with improvement of symptoms   MGUS -followed by oncology  On surveillance    Obesity Estimated body mass index is 31.29 kg/m as calculated from the following:   Height as of 04/06/23: 5' 7 (1.702 m).   Weight as of 04/06/23: 90.6 kg.     Medical Consultants:    Pulmonary  Discharge Exam:   Vitals:  07/03/23 0859 07/03/23 1212  BP: 130/87 123/74  Pulse: 70 69  Resp:  18  Temp:  98.2 F (36.8 C)  SpO2: 96% 98%   Vitals:   07/03/23 0509 07/03/23 0857 07/03/23 0859 07/03/23 1212  BP: 119/71 127/67 130/87 123/74  Pulse: 60 (!) 58 70 69  Resp: 18 16  18   Temp: 98.1 F (36.7 C)   98.2 F (36.8 C)  TempSrc: Oral     SpO2: 96% 96% 96% 98%    General exam: Appears calm and comfortable.    The results of significant diagnostics  from this hospitalization (including imaging, microbiology, ancillary and laboratory) are listed below for reference.     Procedures and Diagnostic Studies:   CT ABDOMEN PELVIS WO CONTRAST Result Date: 07/01/2023 CLINICAL DATA:  Clinical concern for occult malignancy. EXAM: CT ABDOMEN AND PELVIS WITHOUT CONTRAST TECHNIQUE: Multidetector CT imaging of the abdomen and pelvis was performed following the standard protocol without IV contrast. RADIATION DOSE REDUCTION: This exam was performed according to the departmental dose-optimization program which includes automated exposure control, adjustment of the mA and/or kV according to patient size and/or use of iterative reconstruction technique. COMPARISON:  06/30/2023, 12/16/2019. FINDINGS: Lower chest: No acute abnormality. Hepatobiliary: Multiple scattered hypodensities are present in the liver, large is measuring 3.6 cm in the left lobe is cystic in attenuation. No biliary ductal dilatation is seen. The gallbladder is surgically absent. Pancreas: Unremarkable. No pancreatic ductal dilatation or surrounding inflammatory changes. Spleen: Normal in size without focal abnormality. Adrenals/Urinary Tract: The adrenal glands are within normal limits. Cysts are present in the kidneys bilaterally. No renal calculus or hydronephrosis bilaterally. The bladder is unremarkable. Stomach/Bowel: There is a small hiatal hernia. The stomach is otherwise within normal limits. No bowel obstruction, free air, or pneumatosis is seen. Scattered diverticular present along the colon without evidence of diverticulitis. Vascular/Lymphatic: Aortic atherosclerosis. No enlarged abdominal or pelvic lymph nodes. Reproductive: The prostate gland is borderline enlarged. Other: No abdominopelvic ascites. Musculoskeletal: Degenerative changes are present in the thoracolumbar spine. Anterior spinal fusion hardware is noted from L5-S1. No suspicious osseous abnormality is seen. IMPRESSION: 1. No  evidence of focal mass or metastatic disease. 2. Hepatic and renal cysts. 3. Diverticulosis without diverticulitis. 4. Small hiatal hernia. 5. Aortic atherosclerosis. Electronically Signed   By: Wyvonnia Heimlich M.D.   On: 07/01/2023 16:21   ECHOCARDIOGRAM COMPLETE Result Date: 07/01/2023    ECHOCARDIOGRAM REPORT   Patient Name:   [name] Date of Exam: 07/01/2023 Medical Rec #:  [id number]       Height:       67.0 in Accession #:    [id number]      Weight:       199.8 lb Date of Birth:  1950/05/20       BSA:          2.021 m Patient Age:    73 years        BP:           114/67 mmHg Patient Gender: M               HR:           58 bpm. Exam Location:  Inpatient Procedure: 2D Echo, 3D Echo, Color Doppler, Cardiac Doppler and Strain Analysis            (Both Spectral and Color Flow Doppler were utilized during            procedure). Indications:  Syncope  History:        Patient has prior history of Echocardiogram examinations, most                 recent 12/15/2019. CHF, Stroke; Risk Factors:Hypertension,                 Dyslipidemia, Former Smoker and CKD. GERD.  Sonographer:    Travis Friedman RDCS Referring Phys: [id number] SARA-MAIZ A THOMAS IMPRESSIONS  1. Left ventricular ejection fraction, by estimation, is 65%. Left ventricular ejection fraction by 3D volume is 66 %. The left ventricle has normal function. The left ventricle has no regional wall motion abnormalities. There is mild left ventricular hypertrophy of the inferior and septal segments. Left ventricular diastolic parameters are consistent with Grade II diastolic dysfunction (pseudonormalization). The average left ventricular global longitudinal strain is -22.0 %. The global longitudinal strain is normal.  2. Right ventricular systolic function is normal. The right ventricular size is mildly enlarged. Mildly increased right ventricular wall thickness.  3. Left atrial size was severely dilated.  4. The mitral valve is normal in structure. No evidence of  mitral valve regurgitation. No evidence of mitral stenosis.  5. The aortic valve is tricuspid. There is mild calcification of the aortic valve. Aortic valve regurgitation is not visualized. Aortic valve sclerosis is present, with no evidence of aortic valve stenosis.  6. The inferior vena cava is normal in size with greater than 50% respiratory variability, suggesting right atrial pressure of 3 mmHg. Comparison(s): Prior images reviewed side by side. Left ventricular function is slightly less vigorous. FINDINGS  Left Ventricle: Left ventricular ejection fraction, by estimation, is 65%. Left ventricular ejection fraction by 3D volume is 66 %. The left ventricle has normal function. The left ventricle has no regional wall motion abnormalities. The average left ventricular global longitudinal strain is -22.0 %. Strain was performed and the global longitudinal strain is normal. The left ventricular internal cavity size was normal in size. There is mild left ventricular hypertrophy of the inferior and septal segments. Left ventricular diastolic parameters are consistent with Grade II diastolic dysfunction (pseudonormalization). Right Ventricle: The right ventricular size is mildly enlarged. Mildly increased right ventricular wall thickness. Right ventricular systolic function is normal. Left Atrium: Left atrial size was severely dilated. Right Atrium: Right atrial size was normal in size. Pericardium: There is no evidence of pericardial effusion. Mitral Valve: The mitral valve is normal in structure. No evidence of mitral valve regurgitation. No evidence of mitral valve stenosis. Tricuspid Valve: The tricuspid valve is normal in structure. Tricuspid valve regurgitation is not demonstrated. No evidence of tricuspid stenosis. Aortic Valve: The aortic valve is tricuspid. There is mild calcification of the aortic valve. Aortic valve regurgitation is not visualized. Aortic valve sclerosis is present, with no evidence of  aortic valve stenosis. Aortic valve mean gradient measures 4.0 mmHg. Aortic valve peak gradient measures 6.6 mmHg. Aortic valve area, by VTI measures 2.98 cm. Pulmonic Valve: The pulmonic valve was normal in structure. Pulmonic valve regurgitation is not visualized. No evidence of pulmonic stenosis. Aorta: The aortic root and ascending aorta are structurally normal, with no evidence of dilitation. Venous: The inferior vena cava is normal in size with greater than 50% respiratory variability, suggesting right atrial pressure of 3 mmHg. IAS/Shunts: The atrial septum is grossly normal. Additional Comments: 3D was performed not requiring image post processing on an independent workstation and was normal.  LEFT VENTRICLE PLAX 2D LVIDd:  4.00 cm         Diastology LVIDs:         2.60 cm         LV e' medial:    5.77 cm/s LV PW:         0.80 cm         LV E/e' medial:  16.9 LV IVS:        1.40 cm         LV e' lateral:   6.85 cm/s LVOT diam:     2.00 cm         LV E/e' lateral: 14.2 LV SV:         90 LV SV Index:   44              2D Longitudinal LVOT Area:     3.14 cm        Strain                                2D Strain GLS   -22.0 %                                Avg:                                 3D Volume EF                                LV 3D EF:    Left                                             ventricul                                             ar                                             ejection                                             fraction                                             by 3D                                             volume is  66 %.                                 3D Volume EF:                                3D EF:        66 %                                LV EDV:       115 ml                                LV ESV:       39 ml                                LV SV:        76 ml RIGHT VENTRICLE             IVC RV Basal diam:   4.20 cm     IVC diam: 1.30 cm RV Mid diam:    2.40 cm RV S prime:     11.70 cm/s TAPSE (M-mode): 2.7 cm LEFT ATRIUM              Index        RIGHT ATRIUM           Index LA diam:        4.60 cm  2.28 cm/m   RA Area:     17.80 cm LA Vol (A2C):   97.4 ml  48.18 ml/m  RA Volume:   54.00 ml  26.71 ml/m LA Vol (A4C):   105.0 ml 51.94 ml/m LA Biplane Vol: 102.0 ml 50.46 ml/m  AORTIC VALVE AV Area (Vmax):    3.02 cm AV Area (Vmean):   2.89 cm AV Area (VTI):     2.98 cm AV Vmax:           128.00 cm/s AV Vmean:          87.500 cm/s AV VTI:            0.300 m AV Peak Grad:      6.6 mmHg AV Mean Grad:      4.0 mmHg LVOT Vmax:         123.00 cm/s LVOT Vmean:        80.600 cm/s LVOT VTI:          0.285 m LVOT/AV VTI ratio: 0.95  AORTA Ao Root diam: 3.10 cm Ao Asc diam:  3.40 cm MITRAL VALVE               TRICUSPID VALVE MV Area (PHT): 3.48 cm    TR Peak grad:   30.9 mmHg MV Decel Time: 218 msec    TR Vmax:        278.00 cm/s MV E velocity: 97.40 cm/s MV A velocity: 98.10 cm/s  SHUNTS MV E/A ratio:  0.99        Systemic VTI:  0.29 m  Systemic Diam: 2.00 cm Gloriann Larger MD Electronically signed by Gloriann Larger MD Signature Date/Time: 07/01/2023/1:11:08 PM    Final    CT Chest Wo Contrast Result Date: 06/30/2023 CLINICAL DATA:  Follow-up left suprahilar density EXAM: CT CHEST WITHOUT CONTRAST TECHNIQUE: Multidetector CT imaging of the chest was performed following the standard protocol without IV contrast. RADIATION DOSE REDUCTION: This exam was performed according to the departmental dose-optimization program which includes automated exposure control, adjustment of the mA and/or kV according to patient size and/or use of iterative reconstruction technique. COMPARISON:  Chest x-ray from earlier in the same day. FINDINGS: Cardiovascular: Somewhat limited due to lack of IV contrast. Atherosclerotic calcifications are noted. No aneurysmal dilatation is seen. No cardiac enlargement  is noted. Mediastinum/Nodes: Thoracic inlet is within normal limits. No hilar or mediastinal adenopathy is noted. The esophagus as visualized is within normal limits. Lungs/Pleura: Right lung is well aerated. No focal infiltrate is seen. The left lung demonstrates a soft tissue mass lesion in the left suprahilar region which measures 4.5 x 5.1 cm in greatest AP and transverse dimensions respectively. This is highly suspicious for pulmonary neoplasm and further workup is recommended. Area of apparent cavitation is noted adjacent to the mass lesion. No other nodular changes are seen. Upper Abdomen: Visualized upper abdomen shows a cystic lesion within the left lobe of the liver. No other focal abnormality is noted. Musculoskeletal: Degenerative changes of the thoracic spine are seen. No acute rib abnormality is noted. IMPRESSION: Soft tissue mass in the left suprahilar region suspicious for pulmonary neoplasm. Per Fleischner Society Guidelines, recommend a PET/CT or tissue sampling. These guidelines do not apply to immunocompromised patients and patients with cancer. Follow up in patients with significant comorbidities as clinically warranted. For lung cancer screening, adhere to Lung-RADS guidelines. Reference: Radiology. 2017; 284(1):228-43. Aortic Atherosclerosis (ICD10-I70.0). Electronically Signed   By: Violeta Grey M.D.   On: 06/30/2023 22:48   CT Head Wo Contrast Result Date: 06/30/2023 CLINICAL DATA:  Mental status change, unknown cause. Lightheadedness and dizziness while playing golf. Orthostatic hypotension. EXAM: CT HEAD WITHOUT CONTRAST TECHNIQUE: Contiguous axial images were obtained from the base of the skull through the vertex without intravenous contrast. RADIATION DOSE REDUCTION: This exam was performed according to the departmental dose-optimization program which includes automated exposure control, adjustment of the mA and/or kV according to patient size and/or use of iterative reconstruction  technique. COMPARISON:  Head CT and MRI 09/11/2022 FINDINGS: Brain: There is no evidence of an acute infarct, intracranial hemorrhage, mass, midline shift, or extra-axial fluid collection. Mild cerebral atrophy is within normal limits for age. The ventricles are normal in size. Age advanced chronic white matter disease predominantly involving the periventricular white matter is similar to the prior CT. Vascular: No hyperdense vessel. Skull: No fracture or suspicious lesion. Sinuses/Orbits: Partial opacification of an anterior right ethmoid air cell. Clear mastoid air cells. Unremarkable orbits. Other: None. IMPRESSION: 1. No evidence of acute intracranial abnormality. 2. Age advanced chronic white matter disease, stable and nonspecific. Electronically Signed   By: Aundra Lee M.D.   On: 06/30/2023 19:33   DG Chest Port 1 View Result Date: 06/30/2023 CLINICAL DATA:  Syncope. EXAM: PORTABLE CHEST 1 VIEW COMPARISON:  Radiograph 04/25/2022 FINDINGS: Normal cardiac silhouette. LEFT suprahilar density measuring 5.5 x 4.8 cm. No pleural fluid. No pneumothorax. IMPRESSION: LEFT suprahilar density representing pneumonia versus pulmonary mass. Recommend CT thorax with contrast for further evaluation. Electronically Signed   By: Deboraha Fallow M.D.   On: 06/30/2023  18:30     Labs:   Basic Metabolic Panel: Recent Labs  Lab 06/30/23 2102 07/01/23 0540 07/02/23 0544 07/03/23 0507  NA 133* 135 135 135  K 4.2 4.2 4.0 3.9  CL 103 107 106 107  CO2 22 21* 19* 21*  GLUCOSE 88 89 97 100*  BUN 28* 25* 19 14  CREATININE 2.07* 1.71* 1.38* 1.35*  CALCIUM  8.1* 8.3* 8.8* 8.5*   GFR CrCl cannot be calculated (Unknown ideal weight.). Liver Function Tests: Recent Labs  Lab 06/30/23 2102 07/01/23 0540  AST 24 22  ALT 23 24  ALKPHOS 48 49  BILITOT 0.7 0.5  PROT 6.6 6.2*  ALBUMIN 3.6 3.5   No results for input(s): LIPASE, AMYLASE in the last 168 hours. No results for input(s): AMMONIA in the last 168  hours. Coagulation profile No results for input(s): INR, PROTIME in the last 168 hours.  CBC: Recent Labs  Lab 06/30/23 2102 07/01/23 0540 07/02/23 0544 07/03/23 0507  WBC 5.5 5.0 5.4 5.0  NEUTROABS 3.0  --   --   --   HGB 10.6* 10.2* 10.9* 10.4*  HCT 31.5* 29.4* 32.0* 30.6*  MCV 99.1 98.0 98.5 99.4  PLT 259 246 296 242   Cardiac Enzymes: No results for input(s): CKTOTAL, CKMB, CKMBINDEX, TROPONINI in the last 168 hours. BNP: Invalid input(s): POCBNP CBG: No results for input(s): GLUCAP in the last 168 hours. D-Dimer No results for input(s): DDIMER in the last 72 hours. Hgb A1c Recent Labs    07/01/23 0540  HGBA1C 5.2   Lipid Profile No results for input(s): CHOL, HDL, LDLCALC, TRIG, CHOLHDL, LDLDIRECT in the last 72 hours. Thyroid  function studies No results for input(s): TSH, T4TOTAL, T3FREE, THYROIDAB in the last 72 hours.  Invalid input(s): FREET3 Anemia work up No results for input(s): VITAMINB12, FOLATE, FERRITIN, TIBC, IRON, RETICCTPCT in the last 72 hours. Microbiology No results found for this or any previous visit (from the past 240 hours).   Discharge Instructions:   Discharge Instructions     (HEART FAILURE PATIENTS) Call MD:  Anytime you have any of the following symptoms: 1) 3 pound weight gain in 24 hours or 5 pounds in 1 week 2) shortness of breath, with or without a dry hacking cough 3) swelling in the hands, feet or stomach 4) if you have to sleep on extra pillows at night in order to breathe.   Complete by: As directed    Diet - low sodium heart healthy   Complete by: As directed    Discharge instructions   Complete by: As directed    Weigh daily-- first thing in the AM after urinating PET scan and plan for outpatient pulmonary follow-up   Increase activity slowly   Complete by: As directed       Allergies as of 07/03/2023       Reactions   Divalproex Sodium Hives   Lamotrigine  Diarrhea, Other (See Comments)   Shakes and unsteadiness   Valproic Acid Hives, Other (See Comments)   Shakes GI problems, too   Amitriptyline Other (See Comments)   Reaction not known   Aspirin  Nausea And Vomiting, Other (See Comments)   Dizziness, weakness and balance issues, and sweats too   Lisinopril Cough      Lithium Diarrhea, Other (See Comments)   Falls over and shakes    Morphine  Other (See Comments)   'not tolerated well   Nsaids Other (See Comments)   Kidney disease   Oxycodone Nausea And Vomiting  Oxycodone-acetaminophen  Nausea And Vomiting, Other (See Comments)   Also causes dizziness   Pork Allergy Nausea Only        Medication List     PAUSE taking these medications    candesartan 32 MG tablet Wait to take this until your doctor or other care provider tells you to start again. Commonly known as: ATACAND Take 32 mg by mouth daily.   furosemide  40 MG tablet Wait to take this until your doctor or other care provider tells you to start again. Commonly known as: LASIX  Take 1 tablet (40 mg total) by mouth daily as needed for edema. What changed:  when to take this reasons to take this   hydrALAZINE  50 MG tablet Wait to take this until your doctor or other care provider tells you to start again. Commonly known as: APRESOLINE  Take 1 tablet (50 mg total) by mouth 2 (two) times daily.   metoprolol  succinate 25 MG 24 hr tablet Wait to take this until your doctor or other care provider tells you to start again. Commonly known as: Toprol  XL Take 1 tablet (25 mg total) by mouth daily. What changed: how much to take   spironolactone  25 MG tablet Wait to take this until your doctor or other care provider tells you to start again. Commonly known as: ALDACTONE  Take 25 mg by mouth daily.       STOP taking these medications    acidophilus Caps capsule Replaced by: Acidophilus Caps capsule   Belsomra  10 MG Tabs Generic drug: Suvorexant    Belsomra  5 MG  Tabs Generic drug: Suvorexant    benzonatate  100 MG capsule Commonly known as: TESSALON    fluticasone  50 MCG/ACT nasal spray Commonly known as: FLONASE        TAKE these medications    acetaminophen  500 MG tablet Commonly known as: TYLENOL  Take 1-2 tablets (500-1,000 mg total) by mouth every 6 (six) hours as needed for moderate pain.   Acidophilus Caps capsule Take 1 capsule by mouth daily. Replaces: acidophilus Caps capsule   allopurinol 100 MG tablet Commonly known as: ZYLOPRIM Take 150 mg by mouth daily.   amLODipine  5 MG tablet Commonly known as: NORVASC  Take 1 tablet (5 mg total) by mouth daily.   amoxicillin 500 MG capsule Commonly known as: AMOXIL Take 1,000 mg by mouth See admin instructions. Take 1,000 mg by mouth once hour prior to dental appointments   busPIRone  30 MG tablet Commonly known as: BUSPAR  Take 30 mg by mouth 2 (two) times daily.   cetirizine  10 MG tablet Commonly known as: ZyrTEC  Allergy Take 1 tablet (10 mg total) by mouth daily. What changed: when to take this   clopidogrel  75 MG tablet Commonly known as: PLAVIX  Take 75 mg by mouth daily.   folic acid  1 MG tablet Commonly known as: FOLVITE  Take 1 mg by mouth daily.   Hydroxychloroquine Sulfate 400 MG Tabs Take 400 mg by mouth at bedtime.   lidocaine  5 % Commonly known as: LIDODERM  Place 1 patch onto the skin daily as needed (for pain- Remove & Discard patch within 12 hours or as directed by MD).   PARoxetine  37.5 MG 24 hr tablet Commonly known as: PAXIL -CR Take 1 tablet (37.5 mg total) by mouth at bedtime.   primidone  250 MG tablet Commonly known as: MYSOLINE  Take 1 tablet (250 mg total) by mouth 2 (two) times daily.   QUEtiapine  25 MG tablet Commonly known as: SEROQUEL  Take 25 mg by mouth at bedtime.   rosuvastatin  40 MG  tablet Commonly known as: CRESTOR  Take 40 mg by mouth daily.   traZODone  100 MG tablet Commonly known as: DESYREL  Take 100 mg by mouth at bedtime.    valACYclovir 1000 MG tablet Commonly known as: VALTREX Take 1,000 mg by mouth daily.   Vitamin D (Ergocalciferol) 1.25 MG (50000 UNIT) Caps capsule Commonly known as: DRISDOL Take 50,000 Units by mouth every Saturday.        Follow-up Information     Trellis Fries, MD Follow up in 1 week(s).   Specialty: Family Medicine Why: BP check and BMP PET scan and  outpatient pulmonary follow-up Contact information: [address] [place] [address] [phone]                  Time coordinating discharge: 45 min  Signed:  Enrigue Harvard DO  [facility] 07/03/2023, 2:16 PM

## 2023-07-03 NOTE — Telephone Encounter (Signed)
 Patient with left upper lobe mass  PET scan ordered as outpatient  Needs follow-up with Dr. Baldwin Levee or APP  Kindly call to offer appointment

## 2023-07-03 NOTE — Progress Notes (Signed)
   NAME:  [name], MRN:  [id number], DOB:  28-Nov-1950, LOS: 3 ADMISSION DATE:  06/30/2023, CONSULTATION DATE:  07/02/2023 REFERRING MD:  Dr Mikle Alexanders, CHIEF COMPLAINT:  abnormal Ct Chest   History of Present Illness:  Asked to see patient for abnormal CT scan showing a left upper lobe mass lesion   He has had a cough on and off, cough is dry, no chest pain, no hemoptysis Follows up with a pulmonologist at [facility]. [name] - Was last seen about July 2024-was doing well at the time - Had a CT scan, was scheduled for CT scan in January 2025 - I did call [name] he had a CAT scan done in July 2024   History of hypertension, right brain subcortical infarct in 2011, MGUS   Presented to the hospital from [facility] [place] with near syncope, blood pressure was low at presentation  Pertinent  Medical History   Past Medical History:  Diagnosis Date   (HFpEF) heart failure with preserved ejection fraction (HCC)    Allergy    Anxiety    Arthritis    Chronic kidney disease    Depression    Frequent urination    GERD (gastroesophageal reflux disease)    Gout    Headache    mirgaines in the past   Herpes    History of nuclear stress test    Myoview  05/2021: EF 77, normal perfusion, low risk   Hyperlipidemia    Hypertension    IBS (irritable bowel syndrome)    Memory loss    MGUS (monoclonal gammopathy of unknown significance)    Obesity    Pre-diabetes    Stroke (HCC)    Urgency of urination    Significant Hospital Events: Including procedures, antibiotic start and stop dates in addition to other pertinent events   06/30/2023-CT chest with a 4.5 x 5.1 suprahilar mass lesion  Interim History / Subjective:  Resting Discussed with daughter at bedside  Objective    Blood pressure 119/71, pulse 60, temperature 98.1 F (36.7 C), temperature source Oral, resp. rate 18, SpO2 96%.        Intake/Output Summary (Last 24 hours) at 07/03/2023 0829 Last  data filed at 07/02/2023 1949 Gross per 24 hour  Intake 240 ml  Output --  Net 240 ml   There were no vitals filed for this visit.  Examination: Appears comfortable  Resolved problem list   Assessment and Plan   Left upper lobe mass lesion  I Called [facility] - Expecting a fax regarding his previous CT that was performed in June/July 2024 - Concern remains a neoplastic process in the left upper lobe  MGUS Acute kidney injury on chronic kidney disease stage III Hypertension Hyperlipidemia Heart failure with preserved ejection fraction GERD  Depending on previous findings, will require navigational bronchoscopy  Myer Artis, MD  PCCM Pager: See [name]

## 2023-07-03 NOTE — Telephone Encounter (Signed)
 Called patient and he said he would call back tomorrow to be scheduled.

## 2023-07-03 NOTE — Progress Notes (Signed)
   07/03/23 1335  TOC Brief Assessment  Insurance and Status Reviewed  Patient has primary care physician Yes  Home environment has been reviewed [employer] [facility]  Prior level of function: Independent  Prior/Current Home Services No current home services  Social Drivers of Health Review SDOH reviewed no interventions necessary  Readmission risk has been reviewed Yes  Transition of care needs no transition of care needs at this time

## 2023-07-04 ENCOUNTER — Encounter: Payer: Self-pay | Admitting: *Deleted

## 2023-07-04 NOTE — Telephone Encounter (Signed)
 LM for PT to call for appt. Will send [facility] as well.

## 2023-07-05 ENCOUNTER — Encounter: Payer: Self-pay | Admitting: Internal Medicine

## 2023-07-05 ENCOUNTER — Encounter: Payer: Self-pay | Admitting: Neurology

## 2023-07-05 ENCOUNTER — Encounter: Payer: Self-pay | Admitting: Pulmonary Disease

## 2023-07-05 NOTE — Telephone Encounter (Signed)
 Sending letter via postal service today. Called and left VM also.

## 2023-07-13 ENCOUNTER — Telehealth: Payer: Self-pay | Admitting: Neurology

## 2023-07-13 ENCOUNTER — Ambulatory Visit: Attending: Internal Medicine | Admitting: Internal Medicine

## 2023-07-13 ENCOUNTER — Telehealth: Payer: Self-pay | Admitting: Internal Medicine

## 2023-07-13 ENCOUNTER — Encounter (HOSPITAL_COMMUNITY)

## 2023-07-13 ENCOUNTER — Encounter: Payer: Self-pay | Admitting: Internal Medicine

## 2023-07-13 VITALS — BP 135/82 | HR 62 | Ht 67.0 in | Wt 199.0 lb

## 2023-07-13 DIAGNOSIS — I5032 Chronic diastolic (congestive) heart failure: Secondary | ICD-10-CM | POA: Insufficient documentation

## 2023-07-13 DIAGNOSIS — I10 Essential (primary) hypertension: Secondary | ICD-10-CM | POA: Diagnosis present

## 2023-07-13 DIAGNOSIS — G250 Essential tremor: Secondary | ICD-10-CM

## 2023-07-13 DIAGNOSIS — Z8673 Personal history of transient ischemic attack (TIA), and cerebral infarction without residual deficits: Secondary | ICD-10-CM | POA: Diagnosis present

## 2023-07-13 DIAGNOSIS — N179 Acute kidney failure, unspecified: Secondary | ICD-10-CM | POA: Diagnosis present

## 2023-07-13 MED ORDER — HYDRALAZINE HCL 50 MG PO TABS: 50.0000 mg | ORAL_TABLET | Freq: Every day | ORAL | 11 refills | Status: DC | PRN

## 2023-07-13 MED ORDER — FUROSEMIDE 40 MG PO TABS: 40.0000 mg | ORAL_TABLET | Freq: Every day | ORAL | 3 refills | Status: AC

## 2023-07-13 NOTE — Telephone Encounter (Signed)
 Pt c/o medication issue 1. Name of Medication: Hydralazine   2. How are you currently taking this medication (dosage and times per day)? N/a  3. Are you having a reaction (difficulty breathing--STAT)? No   4. What is your medication issue? Pt daughter called in to confirm if Dr. Santo wants stop or restart Hydralazine . Please advise.

## 2023-07-13 NOTE — Progress Notes (Signed)
 Cardiology Office Note:    Date:  07/13/2023   ID:  [name], DOB 1950-09-20, MRN [id number]  PCP:  Christopher Francisco, MD  [facility] Cardiologist:  Christopher DELENA Leavens, MD  [facility] Electrophysiologist:  None   CC: Hospital Follow up  History of Present Illness:    [name] is a 73 y.o. male with a hx of HTN, HLD, Stroke NOS, MCD NOS, established in 2021.  2022 Started on spironolactone  & lasix  40 mg PO; then started on amlodipine  and hydralazine  with [facility].  Had Ibuprofen use leading to AKI. 2023: asymptomatic and was pre-oped for surgery.  Called back in with new CP.X1 (at pre-op).  Has normal stress test. Is going for surgery October 30th. 2024: No symptoms BP controlled and doing well  [name] is a 73 year old male with hypertension and hyperlipidemia who presents with recent hospitalization for hypotension and acute kidney injury. He is accompanied by his daughter, [name].  He was recently hospitalized due to hypotension and acute kidney injury, experiencing weakness and near syncope with a blood pressure reading of 80/50 mmHg. His symptoms included feeling weak and 'like you were going to fall out'. During the hospitalization, his kidney function was found to be impaired, and he was treated with intravenous fluids.  He has a history of hypertension, managed with spironolactone , Lasix , amlodipine , and hydralazine . Recently, his blood pressure has been low, leading to the cessation of some medications. His blood pressure was noted to be low throughout the week prior to hospitalization.  He has hyperlipidemia and is currently taking rosuvastatin . His medication regimen has been adjusted multiple times to manage his blood pressure and kidney function. He also has a history of monoclonal gammopathy of unknown significance, diagnosed recently. He has atrial dilation without heart dysfunction.  He experienced diarrhea, contributing to dehydration and  kidney issues. He has been taking probiotics to manage this symptom. He lives in an independent living facility and is trying to increase fluid intake by adding flavor to his water . He typically consumes one meal a day and is working on incorporating Ensure into his diet to maintain weight.  He has a history of significant ibuprofen use, which previously led to an acute kidney injury in 2023. His kidney function has been stable since then, but recent dehydration has caused a decline. He is currently on clopidogrel  due to his history of stroke and transient ischemic attack.  He is scheduled for a PET scan to evaluate a mass found in his lungs during a recent CT scan, initially performed due to concerns of pneumonia.  Past Medical History:  Diagnosis Date   (HFpEF) heart failure with preserved ejection fraction (HCC)    Allergy    Anxiety    Arthritis    Chronic kidney disease    Depression    Frequent urination    GERD (gastroesophageal reflux disease)    Gout    Headache    mirgaines in the past   Herpes    History of nuclear stress test    Myoview  05/2021: EF 77, normal perfusion, low risk   Hyperlipidemia    Hypertension    IBS (irritable bowel syndrome)    Memory loss    MGUS (monoclonal gammopathy of unknown significance)    Obesity    Pre-diabetes    Stroke [facility])    Urgency of urination     Past Surgical History:  Procedure Laterality Date   CHOLECYSTECTOMY N/A 12/17/2019  Procedure: LAPAROSCOPIC CHOLECYSTECTOMY WITH ICG INJECTION;  Surgeon: Christopher Cough, MD;  [facility] OR;  Service: General;  Laterality: N/A;   disckec     shouler arthroscopy Left    SPINAL FUSION     TONSILLECTOMY     TOTAL KNEE ARTHROPLASTY Right 11/15/2021   Procedure: TOTAL KNEE ARTHROPLASTY;  Surgeon: Christopher Lerner, MD;  Location: [facility] ORS;  Service: Orthopedics;  Laterality: Right;   VASECTOMY     no per pt   WISDOM TOOTH EXTRACTION      Current Medications: Current Meds   Medication Sig   acetaminophen  (TYLENOL ) 500 MG tablet Take 1-2 tablets (500-1,000 mg total) by mouth every 6 (six) hours as needed for moderate pain.   allopurinol  (ZYLOPRIM ) 100 MG tablet Take 150 mg by mouth daily.   amLODipine  (NORVASC ) 5 MG tablet Take 1 tablet (5 mg total) by mouth daily.   amoxicillin (AMOXIL) 500 MG capsule Take 1,000 mg by mouth See admin instructions. Take 1,000 mg by mouth once hour prior to dental appointments   busPIRone  (BUSPAR ) 30 MG tablet Take 30 mg by mouth 2 (two) times daily.   cetirizine  (ZYRTEC  ALLERGY) 10 MG tablet Take 1 tablet (10 mg total) by mouth daily. (Patient taking differently: Take 10 mg by mouth at bedtime.)   clopidogrel  (PLAVIX ) 75 MG tablet Take 75 mg by mouth daily.   folic acid  (FOLVITE ) 1 MG tablet Take 1 mg by mouth daily.   Hydroxychloroquine  Sulfate 400 MG TABS Take 400 mg by mouth at bedtime.   Lactobacillus (ACIDOPHILUS) CAPS capsule Take 1 capsule by mouth daily.   lidocaine  (LIDODERM ) 5 % Place 1 patch onto the skin daily as needed (for pain- Remove & Discard patch within 12 hours or as directed by MD).   PARoxetine  (PAXIL -CR) 37.5 MG 24 hr tablet Take 1 tablet (37.5 mg total) by mouth at bedtime. (Patient taking differently: Take 1 tablet (37.5 mg total) by mouth at bedtime.)   primidone  (MYSOLINE ) 250 MG tablet Take 1 tablet (250 mg total) by mouth 2 (two) times daily.   QUEtiapine  (SEROQUEL ) 25 MG tablet Take 25 mg by mouth at bedtime.   rosuvastatin  (CRESTOR ) 40 MG tablet Take 40 mg by mouth daily.   traZODone  (DESYREL ) 100 MG tablet Take 100 mg by mouth at bedtime.   valACYclovir  (VALTREX ) 1000 MG tablet Take 1,000 mg by mouth daily.   Vitamin D , Ergocalciferol , (DRISDOL ) 1.25 MG (50000 UNIT) CAPS capsule Take 50,000 Units by mouth every Saturday.     Allergies:   Divalproex sodium, Lamotrigine, Valproic acid, Amitriptyline, Aspirin , Lisinopril, Lithium, Morphine , Nsaids, Oxycodone, Oxycodone-acetaminophen , and Pork allergy    Social History   Socioeconomic History   Marital status: Divorced    Spouse name: Not on file   Number of children: 1   Years of education: Masters   Highest education level: Not on file  Occupational History   Occupation: retired    Associate Professor: RETIRED  Tobacco Use   Smoking status: Former   Smokeless tobacco: Never  Advertising account planner   Vaping status: Never Used  Substance and Sexual Activity   Alcohol  use: No    Alcohol /week: 1.0 standard drink of alcohol     Types: 1 Cans of beer per week   Drug use: No   Sexual activity: Never  Other Topics Concern   Not on file  Social History Narrative   Patient lives in Independent living @ [facility]   Patient is right-handed.   Patient does not drink any  caffeine.   Social Drivers of Corporate investment banker Strain: Not on file  Food Insecurity: No Food Insecurity (07/01/2023)   Hunger Vital Sign    Worried About Running Out of Food in the Last Year: Never true    Ran Out of Food in the Last Year: Never true  Transportation Needs: No Transportation Needs (07/01/2023)   PRAPARE - Administrator, Civil Service (Medical): No    Lack of Transportation (Non-Medical): No  Physical Activity: Not on file  Stress: Not on file  Social Connections: Moderately Isolated (07/01/2023)   Social Connection and Isolation Panel    Frequency of Communication with Friends and Family: Twice a week    Frequency of Social Gatherings with Friends and Family: Twice a week    Attends Religious Services: Never    Database administrator or Organizations: Yes    Attends Engineer, structural: More than 4 times per year    Marital Status: Divorced    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  Family History: The patient's family history includes Cancer in his maternal aunt and maternal aunt; Cancer - Ovarian in his maternal grandmother; Diabetes Mellitus I in his  paternal grandmother; Hypertension in his brother, father, mother, paternal grandfather, and sister. History of coronary artery disease notable for no members. History of heart failure notable for no members. No history of cardiomyopathies including hypertrophic cardiomyopathy, left ventricular non-compaction, or arrhythmogenic right ventricular cardiomyopathy. History of arrhythmia notable for no members. Denies family history of sudden cardiac death including drowning, car accidents, or unexplained deaths in the family. No history of bicuspid aortic valve or aortic aneurysm or dissection.  ROS:   Please see the history of present illness.     EKGs/Labs/Other Studies Reviewed:    The following studies were reviewed today:  EKG: First-degree atrioventricular block and right bundle branch block Echocardiogram: Slightly reduced ejection fraction compared to previous, still within normal limits  Cardiac Studies & Procedures   ______________________________________________________________________________________________   STRESS TESTS  MYOCARDIAL PERFUSION IMAGING 05/20/2021  Narrative   The study is normal. The study is low risk.   No ST deviation was noted.   LV perfusion is normal.   Left ventricular function is normal. Nuclear stress EF: 77 %. The left ventricular ejection fraction is hyperdynamic (>65%). End diastolic cavity size is normal. End systolic cavity size is normal.   Prior study not available for comparison.  Normal perfusion. LVEF 77% with normal wall motion. This is a low risk study. No prior for comparison.   ECHOCARDIOGRAM  ECHOCARDIOGRAM COMPLETE 07/01/2023  Narrative ECHOCARDIOGRAM REPORT    Patient Name:   [name] Date of Exam: 07/01/2023 Medical Rec #:  [id number]       Height:       67.0 in Accession #:    [id number]      Weight:       199.8 lb Date of Birth:  06/12/1950       BSA:          2.021 m Patient Age:    73 years        BP:            114/67 mmHg Patient Gender: M               HR:           58 bpm. Exam Location:  Inpatient  Procedure: 2D Echo, 3D Echo, Color Doppler, Cardiac Doppler and Strain Analysis (Both Spectral and Color Flow Doppler were utilized during procedure).  Indications:    Syncope  History:        Patient has prior history of Echocardiogram examinations, most recent 12/15/2019. CHF, Stroke; Risk Factors:Hypertension, Dyslipidemia, Former Smoker and CKD. GERD.  Sonographer:    Logan Shove RDCS Referring Phys: [id number] SARA-MAIZ A THOMAS  IMPRESSIONS   1. Left ventricular ejection fraction, by estimation, is 65%. Left ventricular ejection fraction by 3D volume is 66 %. The left ventricle has normal function. The left ventricle has no regional wall motion abnormalities. There is mild left ventricular hypertrophy of the inferior and septal segments. Left ventricular diastolic parameters are consistent with Grade II diastolic dysfunction (pseudonormalization). The average left ventricular global longitudinal strain is -22.0 %. The global longitudinal strain is normal. 2. Right ventricular systolic function is normal. The right ventricular size is mildly enlarged. Mildly increased right ventricular wall thickness. 3. Left atrial size was severely dilated. 4. The mitral valve is normal in structure. No evidence of mitral valve regurgitation. No evidence of mitral stenosis. 5. The aortic valve is tricuspid. There is mild calcification of the aortic valve. Aortic valve regurgitation is not visualized. Aortic valve sclerosis is present, with no evidence of aortic valve stenosis. 6. The inferior vena cava is normal in size with greater than 50% respiratory variability, suggesting right atrial pressure of 3 mmHg.  Comparison(s): Prior images reviewed side by side. Left ventricular function is slightly less vigorous.  FINDINGS Left Ventricle: Left ventricular ejection fraction, by estimation, is 65%. Left  ventricular ejection fraction by 3D volume is 66 %. The left ventricle has normal function. The left ventricle has no regional wall motion abnormalities. The average left ventricular global longitudinal strain is -22.0 %. Strain was performed and the global longitudinal strain is normal. The left ventricular internal cavity size was normal in size. There is mild left ventricular hypertrophy of the inferior and septal segments. Left ventricular diastolic parameters are consistent with Grade II diastolic dysfunction (pseudonormalization).  Right Ventricle: The right ventricular size is mildly enlarged. Mildly increased right ventricular wall thickness. Right ventricular systolic function is normal.  Left Atrium: Left atrial size was severely dilated.  Right Atrium: Right atrial size was normal in size.  Pericardium: There is no evidence of pericardial effusion.  Mitral Valve: The mitral valve is normal in structure. No evidence of mitral valve regurgitation. No evidence of mitral valve stenosis.  Tricuspid Valve: The tricuspid valve is normal in structure. Tricuspid valve regurgitation is not demonstrated. No evidence of tricuspid stenosis.  Aortic Valve: The aortic valve is tricuspid. There is mild calcification of the aortic valve. Aortic valve regurgitation is not visualized. Aortic valve sclerosis is present, with no evidence of aortic valve stenosis. Aortic valve mean gradient measures 4.0 mmHg. Aortic valve peak gradient measures 6.6 mmHg. Aortic valve area, by VTI measures 2.98 cm.  Pulmonic Valve: The pulmonic valve was normal in structure. Pulmonic valve regurgitation is not visualized. No evidence of pulmonic stenosis.  Aorta: The aortic root and ascending aorta are structurally normal, with no evidence of dilitation.  Venous: The inferior vena cava is normal in size with greater than 50% respiratory variability, suggesting right atrial pressure of 3 mmHg.  IAS/Shunts: The atrial  septum is grossly normal.  Additional Comments: 3D was performed not requiring image post processing on an independent workstation and was normal.   LEFT VENTRICLE PLAX 2D  LVIDd:         4.00 cm         Diastology LVIDs:         2.60 cm         LV e' medial:    5.77 cm/s LV PW:         0.80 cm         LV E/e' medial:  16.9 LV IVS:        1.40 cm         LV e' lateral:   6.85 cm/s LVOT diam:     2.00 cm         LV E/e' lateral: 14.2 LV SV:         90 LV SV Index:   44              2D Longitudinal LVOT Area:     3.14 cm        Strain 2D Strain GLS   -22.0 % Avg:  3D Volume EF LV 3D EF:    Left ventricul ar ejection fraction by 3D volume is 66 %.  3D Volume EF: 3D EF:        66 % LV EDV:       115 ml LV ESV:       39 ml LV SV:        76 ml  RIGHT VENTRICLE             IVC RV Basal diam:  4.20 cm     IVC diam: 1.30 cm RV Mid diam:    2.40 cm RV S prime:     11.70 cm/s TAPSE (M-mode): 2.7 cm  LEFT ATRIUM              Index        RIGHT ATRIUM           Index LA diam:        4.60 cm  2.28 cm/m   RA Area:     17.80 cm LA Vol (A2C):   97.4 ml  48.18 ml/m  RA Volume:   54.00 ml  26.71 ml/m LA Vol (A4C):   105.0 ml 51.94 ml/m LA Biplane Vol: 102.0 ml 50.46 ml/m AORTIC VALVE AV Area (Vmax):    3.02 cm AV Area (Vmean):   2.89 cm AV Area (VTI):     2.98 cm AV Vmax:           128.00 cm/s AV Vmean:          87.500 cm/s AV VTI:            0.300 m AV Peak Grad:      6.6 mmHg AV Mean Grad:      4.0 mmHg LVOT Vmax:         123.00 cm/s LVOT Vmean:        80.600 cm/s LVOT VTI:          0.285 m LVOT/AV VTI ratio: 0.95  AORTA Ao Root diam: 3.10 cm Ao Asc diam:  3.40 cm  MITRAL VALVE               TRICUSPID VALVE MV Area (PHT): 3.48 cm    TR Peak grad:   30.9 mmHg MV Decel Time: 218 msec    TR Vmax:        278.00 cm/s MV E velocity: 97.40 cm/s MV A velocity: 98.10 cm/s  SHUNTS MV E/A ratio:  0.99  Systemic VTI:  0.29 m Systemic Diam: 2.00 cm  Christopher Leavens MD Electronically signed by Christopher Leavens MD Signature Date/Time: 07/01/2023/1:11:08 PM    Final          ______________________________________________________________________________________________       Recent Labs: 07/01/2023: ALT 24 07/03/2023: BUN 14; Creatinine, Ser 1.35; Hemoglobin 10.4; Platelets 242; Potassium 3.9; Sodium 135  Recent Lipid Panel    Component Value Date/Time   CHOL 140 06/04/2020 1320   TRIG 106 06/04/2020 1320   HDL 35 (L) 06/04/2020 1320   CHOLHDL 4.0 06/04/2020 1320   CHOLHDL 5.7 12/17/2019 0241   VLDL 36 12/17/2019 0241   LDLCALC 85 06/04/2020 1320    Physical Exam:    VS:  BP 135/82 (BP Location: Left Arm)   Pulse 62   Ht 5' 7 (1.702 m)   Wt 199 lb (90.3 kg)   SpO2 95%   BMI 31.17 kg/m     Wt Readings from Last 3 Encounters:  07/13/23 199 lb (90.3 kg)  04/06/23 199 lb 12.8 oz (90.6 kg)  12/20/22 198 lb 14.4 oz (90.2 kg)    Gen: No distress  Neck: No JVD Cardiac: No Rubs or Gallops, no murmur, regular rhythm, +2 radial pulses Respiratory: Clear to auscultation bilaterally, normal effort, normal  respiratory rate GI: Soft, nontender, non-distended  MS: Non pitting edema bilaterally;  moves all extremities Neuro:  At time of evaluation, alert and oriented to person/place/time/situation  Psych: Normal affect, patient feels  ok   ASSESMENT/PLAN:    Hypertension Labile hypertension with recent hypotension episodes, complicated by recent hospitalization for near syncope and dehydration. Current blood pressure goal is 140/80 mmHg due to risk of fluctuations. Echocardiogram shows normal heart function, slightly less vigorous than previous but within normal limits. - Continue amlodipine  5 mg daily - Discontinue candesartan - Discontinue hydralazine  - Restart Lasix  (furosemide ); BMP in two weeks - Discontinue metoprolol  succinate - Discontinue spironolactone  - Monitor blood pressure at home - If systolic blood  pressure exceeds 160 mmHg, consider restarting hydralazine  50 mg PO BID; - Schedule follow-up with PharmD in September for blood pressure management - Continue GI diet as discussed - recommend ambulatory BP checks if able; discussed the difficulties that this has been in optimizing care with daughter, Whitney  Hypotension Recent hypotension episodes likely due to dehydration and medication adjustments. Hospitalization for near syncope revealed low blood pressure and dehydration. Diarrhea and insufficient fluid intake contributed to hypotension. - Encourage adequate fluid intake - Monitor blood pressure regularly - Adjust medications as needed based on blood pressure readings  Acute Kidney Injury Acute kidney injury likely secondary to dehydration and hypotension. Recent hospitalization noted decreased kidney function, previously affected by ibuprofen use. - Order BMP in two weeks to monitor kidney function - Ensure adequate hydration; he has a friend who post hospitalization had helped with water  intake  Heart failure with preserved ejection fraction Heart failure with preserved ejection fraction is well-managed. Echocardiogram shows normal heart function, slightly less vigorous than previous but normal. - Adjust medications as needed based on symptoms and blood pressure  First degree heart block Bifascicular block EKG shows progression to a true right bundle branch block. No immediate intervention required as heart function remains stable; if needed we can restart BB but I do not suspect we will need to  Monoclonal gammopathy of undetermined significance (MGUS) New diagnosis of MGUS. No current symptoms related to MGUS. - Monitor for any symptoms or changes - Consider cardiac amyloid screen if  refractory blood pressure issues persist   HX of Stroke Anemia - Stable anemia managed by Dr. Joshua. - plavix  is for stroke indication  Hyperlipidemia -Continue rosuvastatin  20 mg PO  daily  Longitudinal care: The evaluation and management services provided today reflect the complexity inherent in caring for this patient, including the ongoing longitudinal relationship and management of multiple chronic conditions and/or the need for care coordination. The visit required a comprehensive assessment and management plan tailored to the patient's unique needs Time was spent addressing not only the acute concerns but also the broader context of the patient's health, including preventive care, chronic disease management, and care coordination as appropriate.  Complex longitudinal is necessary for conditions including:  Discussing the balance of his medication needs, pill pack burden, dietary issues, and risk of hypotension with his daughter and with family.   Time Spent Directly with Patient:   I have spent a total of 43 minutes with the patient reviewing notes, imaging, EKGs, labs, and examining the patient as well as establishing an assessment and plan that was discussed personally with the patient. Discussed disease state education, using shared and holistic changes for BP control . Reviewed care and plan in collaboration with daughter.  We have discussed use of [facility] if needed for additional pill pack support    Patient Instructions  Medication Instructions:  Your physician has recommended you make the following change in your medication:  STOP: Candesartan  STOP: metoprolol   STOP: spironolactone   START: furosemide  (Lasix ) 40 mg by mouth once daily  *If you need a refill on your cardiac medications before your next appointment, please call your pharmacy*  Lab Work: IN 2 WEEKS at any [facility]: BMP  If you have labs (blood work) drawn today and your tests are completely normal, you will receive your results only by: [employer] (if you have MyChart) OR A paper copy in the mail If you have any lab test that is abnormal or we need to change your treatment, we  will call you to review the results.  Testing/Procedures: Your physician has requested that you see our Pharmacy Clinic in September.   Follow-Up: At [facility], you and your health needs are our priority.  As part of our continuing mission to provide you with exceptional heart care, our providers are all part of one team.  This team includes your primary Cardiologist (physician) and Advanced Practice Providers or APPs (Physician Assistants and Nurse Practitioners) who all work together to provide you with the care you need, when you need it.  Your next appointment:   3 month(s)- October  Provider:   Glendia Ferrier, PA-C           Christopher Leavens, MD FASE [facility] Cardiologist [facility]  [address] [place], [place] [address] [phone]  1:02 PM

## 2023-07-13 NOTE — Patient Instructions (Addendum)
 Medication Instructions:  Your physician has recommended you make the following change in your medication:  STOP: Candesartan  STOP: metoprolol   STOP: spironolactone   START: furosemide  (Lasix ) 40 mg by mouth once daily  *If you need a refill on your cardiac medications before your next appointment, please call your pharmacy*  Lab Work: IN 2 WEEKS at any [facility]: BMP  If you have labs (blood work) drawn today and your tests are completely normal, you will receive your results only by: [employer] (if you have MyChart) OR A paper copy in the mail If you have any lab test that is abnormal or we need to change your treatment, we will call you to review the results.  Testing/Procedures: Your physician has requested that you see our Pharmacy Clinic in September.   Follow-Up: At [facility], you and your health needs are our priority.  As part of our continuing mission to provide you with exceptional heart care, our providers are all part of one team.  This team includes your primary Cardiologist (physician) and Advanced Practice Providers or APPs (Physician Assistants and Nurse Practitioners) who all work together to provide you with the care you need, when you need it.  Your next appointment:   3 month(s)- October  Provider:   Glendia Ferrier, PA-C

## 2023-07-13 NOTE — Telephone Encounter (Signed)
 Spoke with pt daughter, aware the hydralazine  will be stopped for right now. Also aware the potassium will be checked with the blood work in one week. She had also sent a my chart message. All questions answered.

## 2023-07-14 ENCOUNTER — Ambulatory Visit (HOSPITAL_COMMUNITY): Admission: RE | Admit: 2023-07-14 | Source: Ambulatory Visit

## 2023-07-17 NOTE — Telephone Encounter (Signed)
 Pt  daughter called in regards to Pharmacy  stating they have not receive PT medication . Informed PT that  medication is pending and should be release soon

## 2023-07-17 NOTE — Telephone Encounter (Signed)
 Rx sent.

## 2023-07-26 ENCOUNTER — Encounter: Payer: Self-pay | Admitting: Internal Medicine

## 2023-07-26 ENCOUNTER — Encounter (HOSPITAL_COMMUNITY)
Admission: RE | Admit: 2023-07-26 | Discharge: 2023-07-26 | Disposition: A | Discharge status: Home / Self Care | Source: Ambulatory Visit | Attending: Pulmonary Disease | Admitting: Pulmonary Disease

## 2023-07-26 ENCOUNTER — Telehealth: Payer: Self-pay | Admitting: Internal Medicine

## 2023-07-26 DIAGNOSIS — J984 Other disorders of lung: Secondary | ICD-10-CM | POA: Diagnosis present

## 2023-07-26 DIAGNOSIS — C3412 Malignant neoplasm of upper lobe, left bronchus or lung: Secondary | ICD-10-CM | POA: Insufficient documentation

## 2023-07-26 LAB — GLUCOSE, CAPILLARY: Glucose-Capillary: 90 mg/dL (ref 70–99)

## 2023-07-26 MED ORDER — AMLODIPINE BESYLATE 10 MG PO TABS
10.0000 mg | ORAL_TABLET | Freq: Every day | ORAL | 3 refills | Status: AC
Start: 2023-07-26 — End: ?

## 2023-07-26 MED ORDER — AMLODIPINE BESYLATE 10 MG PO TABS: 10.0000 mg | ORAL_TABLET | Freq: Every day | ORAL | 3 refills | Status: DC

## 2023-07-26 NOTE — Telephone Encounter (Signed)
 Completed in a MyChart encounter same day  DOD- Dr Delford reviewed patient's information. Orders to increase Amlodipine  to 10 mg. (Dr Delford would like to have pt take hydralazine  25 mg bid prn for systolic 150 or over or diastolic 90 or more;  but was informed by pt's daughter that the patient cannot have a prn medication due to receiving medication in pill pack and cognitive decline- Dr Delford agreed to only increase the Amlodipine  at this time and monitor BP for 2 weeks. After 2 weeks, if BP's are still increased then may add this medication bid)   Spoke with the patient- he asked me to call his daughter, she is the one who handles his medication and care. Called the patient's daughter. She verbalized understanding. Prescription sent in to pharmacy with instructions:  Note to Pharmacy: This is a dose change. The patient is currently taking 5 mg, which is already in his pill packs. He will need an extra 5 mg added to his current pill packs to equal to this increased dose of 10 mg.    Also, reminded her that the patient is due for repeat blood work. She verbalized understanding. She will call and/or message us  with any questions or concerns.

## 2023-07-26 NOTE — Telephone Encounter (Signed)
 DOD- Dr Delford reviewed patient's information. Orders to increase Amlodipine  to 10 mg. (Dr Delford would like to have pt take hydralazine  25 mg bid prn for systolic 150 or over or diastolic 90 or more;  but was informed by pt's daughter that the patient cannot have a prn medication due to receiving medication in pill pack and cognitive decline- Dr Delford agreed to only increase the Amlodipine  at this time and monitor BP for 2 weeks. After 2 weeks, if BP's are still increased then may add this medication bid)  Spoke with the patient- he asked me to call his daughter, she is the one who handles his medication and care. Called the patient's daughter. She verbalized understanding. Prescription sent in to pharmacy with instructions:  Note to Pharmacy: This is a dose change. The patient is currently taking 5 mg, which is already in his pill packs. He will need an extra 5 mg added to his current pill packs to equal to this increased dose of 10 mg.   Also, reminded her that the patient is due for repeat blood work. She verbalized understanding. She will call and/or message us  with any questions or concerns.

## 2023-07-26 NOTE — Telephone Encounter (Signed)
 Pt c/o BP issue:  1. What are your last 5 BP readings?  Ptient's daughter said she had just sent blood pressure readings through my chart   2. Are you having any other symptoms (ex. Dizziness, headache, blurred vision, passed out)? no  3. What is your medication issue? Pressure is running high

## 2023-07-27 ENCOUNTER — Telehealth: Payer: Self-pay

## 2023-07-27 NOTE — Telephone Encounter (Signed)
 Copied from CRM [id number]. Topic: Clinical - Lab/Test Results >> Jul 27, 2023  9:08 AM Isabell A wrote: Reason for CRM: Patients daughter [name] is requesting a rush on results for patients PET scan completed yesterday. Requesting a call back.    Callback number: [phone]   Spoke w/ patient and daughter due to it not being out department / clinic we do not have a say in turn around time . Advise to  reach out to clinic and if they sit have a turn around time to place go ahead and reschedule  f/u  appt.

## 2023-07-28 ENCOUNTER — Ambulatory Visit: Payer: Self-pay

## 2023-07-28 LAB — BASIC METABOLIC PANEL WITH GFR
BUN/Creatinine Ratio: 11 (ref 10–24)
BUN: 15 mg/dL (ref 8–27)
CO2: 23 mmol/L (ref 20–29)
Calcium: 9.3 mg/dL (ref 8.6–10.2)
Chloride: 100 mmol/L (ref 96–106)
Creatinine, Ser: 1.42 mg/dL — ABNORMAL HIGH (ref 0.76–1.27)
Glucose: 93 mg/dL (ref 70–99)
Potassium: 4.4 mmol/L (ref 3.5–5.2)
Sodium: 140 mmol/L (ref 134–144)
eGFR: 53 mL/min/1.73 — ABNORMAL LOW (ref >59)

## 2023-08-10 ENCOUNTER — Ambulatory Visit: Payer: Self-pay | Admitting: Pulmonary Disease

## 2023-08-10 ENCOUNTER — Telehealth: Payer: Self-pay | Admitting: Pulmonary Disease

## 2023-08-10 ENCOUNTER — Telehealth: Payer: Self-pay | Admitting: Emergency Medicine

## 2023-08-10 DIAGNOSIS — R918 Other nonspecific abnormal finding of lung field: Secondary | ICD-10-CM | POA: Insufficient documentation

## 2023-08-10 NOTE — Telephone Encounter (Signed)
 Discussed case with Dr. Neda.  Patient with left hilar and left upper lobe mass lesion hypermetabolic on PET scan.  He needs a navigational bronchoscopy and I will work on setting this up.  He will be seen preoperatively in our office on 7/28.   Please schedule the following:  Provider performing procedure: Robin Pafford Diagnosis: Left upper lobe mass Which side if for nodule / mass?  Left Procedure: Robotic assisted navigational bronchoscopy Has patient been spoken to by Provider and given informed consent?  Yes Anesthesia: General Do you need Fluro?  Yes Duration of procedure: 30 minutes Date: 08/21/23 Alternate Date: 08/22/2023 Time: any Location: [facility] Does patient have OSA?  No DM? No Or Latex allergy?  No Medication Restriction/ Anticoagulate/Antiplatelet: Stop Plavix  5 days prior Pre-op Labs Ordered:determined by Anesthesia Imaging request: CT chest is available in PACS (If, SuperDimension CT Chest, please have STAT courier sent to ENDO)

## 2023-08-10 NOTE — Telephone Encounter (Signed)
 Updated patient's daughter about PET scan findings  Ensure to make appointment that is already scheduled in the office on Monday  Bronchoscopy tentatively scheduled for 08/21/2023   To hold Plavix  for 5 days for procedure

## 2023-08-11 ENCOUNTER — Encounter: Payer: Self-pay | Admitting: Emergency Medicine

## 2023-08-11 NOTE — Telephone Encounter (Signed)
 Patient scheduled at [facility] 08/21/23 at 11:00 am and is aware of appointment. LVDM about bronch on pt's daughter's phone with our number incase they have further info. Letter is mailed. Routing to [employer] for [employer].

## 2023-08-14 ENCOUNTER — Ambulatory Visit (INDEPENDENT_AMBULATORY_CARE_PROVIDER_SITE_OTHER): Admitting: Primary Care

## 2023-08-14 ENCOUNTER — Encounter: Payer: Self-pay | Admitting: Primary Care

## 2023-08-14 VITALS — BP 143/82 | HR 85 | Ht 67.0 in | Wt 200.2 lb

## 2023-08-14 DIAGNOSIS — R918 Other nonspecific abnormal finding of lung field: Secondary | ICD-10-CM | POA: Diagnosis not present

## 2023-08-14 MED ORDER — HYDRALAZINE HCL 25 MG PO TABS: 25.0000 mg | ORAL_TABLET | Freq: Three times a day (TID) | ORAL | 3 refills | Status: DC

## 2023-08-14 NOTE — Progress Notes (Signed)
 @Patient  ID: Jodi LITTIE Monte, male    DOB: Nov 23, 1950, 73 y.o.   MRN: [id number]  Chief Complaint  Patient presents with   Follow-up    Referring provider: [name], MD  HPI: 73 year old male, former smoker. PMH significant for CHF, HTN, arthritis, MGUS, mass left upper lung, hyperlipidemia, depression.   08/14/2023- Interim hx Discussed the use of AI scribe software for clinical note transcription with the patient, who gave verbal consent to proceed.  History of Present Illness   Sabir L Broughton is a 73 year old male with a hypermetabolic lung mass who presents for follow-up after hospitalization for near syncope. He is accompanied by [name], his daughter and Transport planner.  In June, he was hospitalized due to near syncope, experiencing a sensation of almost passing out with hypotensive blood pressure at 80/40 mmHg. Associated loose stools secondary to IBS. During the hospital stay, a chest x-ray revealed a density in the left lung, further investigated with a CT scan and PET scan, showing a hypermetabolic 5 cm mass in the left upper lung. He is scheduled for a bronchoscopy on August 4th to further evaluate the mass.  He has a history of chronic loose stools attributed to IBS, which has improved considerably with the use of Lactobacillus probiotics taken daily since his discharge from the hospital. These probiotics are included in his pill packs.  He has a history of semi-chronic bronchitis, which has improved with the use of cetirizine  included in his pill packs. He experiences occasional coughing spells, particularly when laughing or sleeping, but has not had significant respiratory symptoms such as hemoptysis or unexplained weight loss. His weight has been stable, fluctuating between 194 to 197 pounds over the last few months.  He is a former smoker, having quit approximately 36 years ago when his daughter was one year old. He smoked from age 30 to 44.  He has mild cognitive  impairment and lives in an independent living facility. There has been some regression in his cognitive abilities.  He follows with oncology for monoclonal gammopathy of undetermined significance (MGUS) and has been receiving regular CT scans for lung cancer screening. The last CT scan a year ago showed slight progression of scarring in the left upper lobe.  He was supposed to undergo a barium swallow test due to difficulty swallowing, but the procedure was not completed as planned.      Allergies  Allergen Reactions   Divalproex Sodium Hives   Lamotrigine Diarrhea and Other (See Comments)    Shakes and unsteadiness     Valproic Acid Hives and Other (See Comments)    Shakes GI problems, too   Amitriptyline Other (See Comments)    Reaction not known   Aspirin  Nausea And Vomiting and Other (See Comments)    Dizziness, weakness and balance issues, and sweats too   Lisinopril Cough         Lithium Diarrhea and Other (See Comments)    Falls over and shakes    Morphine  Other (See Comments)     'not tolerated well   Nsaids Other (See Comments)    Kidney disease   Oxycodone Nausea And Vomiting   Oxycodone-Acetaminophen  Nausea And Vomiting and Other (See Comments)    Also causes dizziness   Pork Allergy Nausea Only     There is no immunization history on file for this patient.  Past Medical History:  Diagnosis Date   (HFpEF) heart failure with preserved ejection fraction (HCC)  Allergy    Anxiety    Arthritis    Chronic kidney disease    Depression    Frequent urination    GERD (gastroesophageal reflux disease)    Gout    Headache    mirgaines in the past   Herpes    History of nuclear stress test    Myoview  05/2021: EF 77, normal perfusion, low risk   Hyperlipidemia    Hypertension    IBS (irritable bowel syndrome)    Memory loss    MGUS (monoclonal gammopathy of unknown significance)    Obesity    Pre-diabetes    Stroke (HCC)    Urgency of urination      Tobacco History: Social History   Tobacco Use  Smoking Status Former  Smokeless Tobacco Never   Counseling given: Not Answered   Outpatient Medications Prior to Visit  Medication Sig Dispense Refill   allopurinol  (ZYLOPRIM ) 100 MG tablet Take 150 mg by mouth daily.     amLODipine  (NORVASC ) 10 MG tablet Take 1 tablet (10 mg total) by mouth daily. 90 tablet 3   amoxicillin (AMOXIL) 500 MG capsule Take 1,000 mg by mouth See admin instructions. Take 1,000 mg by mouth once hour prior to dental appointments     busPIRone  (BUSPAR ) 30 MG tablet Take 30 mg by mouth 2 (two) times daily.     clopidogrel  (PLAVIX ) 75 MG tablet Take 75 mg by mouth daily.     folic acid  (FOLVITE ) 1 MG tablet Take 1 mg by mouth daily.     furosemide  (LASIX ) 40 MG tablet Take 1 tablet (40 mg total) by mouth daily. 90 tablet 3   Hydroxychloroquine  Sulfate 400 MG TABS Take 400 mg by mouth at bedtime.     Lactobacillus (ACIDOPHILUS) CAPS capsule Take 1 capsule by mouth daily. 100 capsule 0   primidone  (MYSOLINE ) 250 MG tablet TAKE 1 TABLET BY MOUTH TWICE A DAY 60 tablet 1   QUEtiapine  (SEROQUEL ) 25 MG tablet Take 25 mg by mouth at bedtime.     rosuvastatin  (CRESTOR ) 40 MG tablet Take 40 mg by mouth daily.     traZODone  (DESYREL ) 100 MG tablet Take 100 mg by mouth at bedtime.     valACYclovir  (VALTREX ) 1000 MG tablet Take 1,000 mg by mouth daily.     Vitamin D , Ergocalciferol , (DRISDOL ) 1.25 MG (50000 UNIT) CAPS capsule Take 50,000 Units by mouth every Saturday.     acetaminophen  (TYLENOL ) 500 MG tablet Take 1-2 tablets (500-1,000 mg total) by mouth every 6 (six) hours as needed for moderate pain. 30 tablet 0   cetirizine  (ZYRTEC  ALLERGY) 10 MG tablet Take 1 tablet (10 mg total) by mouth daily. (Patient taking differently: Take 10 mg by mouth at bedtime.) 30 tablet 0   lidocaine  (LIDODERM ) 5 % Place 1 patch onto the skin daily as needed (for pain- Remove & Discard patch within 12 hours or as directed by MD). (Patient  not taking: Reported on 08/14/2023)     PARoxetine  (PAXIL -CR) 37.5 MG 24 hr tablet Take 1 tablet (37.5 mg total) by mouth at bedtime. (Patient taking differently: Take 1 tablet (37.5 mg total) by mouth at bedtime.) 30 tablet 3   No facility-administered medications prior to visit.    Review of Systems  Review of Systems  Constitutional: Negative.  Negative for fatigue, fever and unexpected weight change.  HENT: Negative.    Respiratory:  Positive for cough. Negative for chest tightness, shortness of breath and wheezing.   Cardiovascular: Negative.  Physical Exam  BP (!) 143/82 (BP Location: Left Arm, Patient Position: Sitting, Cuff Size: Normal)   Pulse 85   Ht 5' 7 (1.702 m)   Wt 200 lb 3.2 oz (90.8 kg)   SpO2 97%   BMI 31.36 kg/m  Physical Exam Constitutional:      General: He is not in acute distress.    Appearance: Normal appearance. He is not ill-appearing.  HENT:     Head: Normocephalic.  Cardiovascular:     Rate and Rhythm: Normal rate.  Pulmonary:     Effort: Pulmonary effort is normal. No respiratory distress.     Breath sounds: Normal breath sounds. No wheezing or rhonchi.  Musculoskeletal:        General: Normal range of motion.  Skin:    General: Skin is warm and dry.  Neurological:     General: No focal deficit present.     Mental Status: He is alert and oriented to person, place, and time. Mental status is at baseline.  Psychiatric:        Mood and Affect: Mood normal.        Behavior: Behavior normal.        Thought Content: Thought content normal.        Judgment: Judgment normal.      Lab Results:  CBC    Component Value Date/Time   WBC 5.0 07/03/2023 0507   RBC 3.08 (L) 07/03/2023 0507   HGB 10.4 (L) 07/03/2023 0507   HGB 11.9 (L) 11/18/2022 1112   HCT 30.6 (L) 07/03/2023 0507   PLT 242 07/03/2023 0507   PLT 301 11/18/2022 1112   MCV 99.4 07/03/2023 0507   MCH 33.8 07/03/2023 0507   MCHC 34.0 07/03/2023 0507   RDW 12.2 07/03/2023  0507   LYMPHSABS 1.7 06/30/2023 2102   MONOABS 0.5 06/30/2023 2102   EOSABS 0.2 06/30/2023 2102   BASOSABS 0.0 06/30/2023 2102    BMET    Component Value Date/Time   NA 140 07/27/2023 1454   K 4.4 07/27/2023 1454   CL 100 07/27/2023 1454   CO2 23 07/27/2023 1454   GLUCOSE 93 07/27/2023 1454   GLUCOSE 100 (H) 07/03/2023 0507   BUN 15 07/27/2023 1454   CREATININE 1.42 (H) 07/27/2023 1454   CREATININE 1.43 (H) 11/18/2022 1112   CALCIUM  9.3 07/27/2023 1454   GFRNONAA 56 (L) 07/03/2023 0507   GFRNONAA 53 (L) 11/18/2022 1112   GFRAA 84 02/12/2020 1022    BNP    Component Value Date/Time   BNP 19.2 12/15/2019 1027    ProBNP    Component Value Date/Time   PROBNP 95 07/17/2020 1048    Imaging: NM PET Image Initial (PI) Skull Base To Thigh Result Date: 07/27/2023 CLINICAL DATA:  Initial treatment strategy for left lung mass. EXAM: NUCLEAR MEDICINE PET SKULL BASE TO THIGH TECHNIQUE: 9.9 mCi F-18 FDG was injected intravenously. Full-ring PET imaging was performed from the skull base to thigh after the radiotracer. CT data was obtained and used for attenuation correction and anatomic localization. Fasting blood glucose: 90 mg/dl COMPARISON:  Chest CT on 06/30/2023 FINDINGS: Mediastinal blood-pool activity (background): SUV max = 2.5 Liver activity (reference): SUV max = N/A NECK: No hypermetabolic lymph nodes. 1.2 cm soft tissue mass in the right parotid gland shows mild hypermetabolism, with SUV max of 2.4. This is consistent with salivary gland neoplasm. Incidental CT findings:  None. CHEST: Irregular mass in the central left upper lobe measures 5.2 x 4.6 cm, and  shows marked hypermetabolism, with SUV max of 22.5. No other suspicious pulmonary nodules identified on CT images. This mass is contiguous with the left hilum. No other sites of hypermetabolic lymphadenopathy identified within the thorax. Incidental CT findings:  None. ABDOMEN/PELVIS: No abnormal hypermetabolic activity within  the liver, pancreas, adrenal glands, or spleen. No hypermetabolic lymph nodes in the abdomen or pelvis. Incidental CT findings: Several small hepatic and bilateral renal cysts noted. Mildly enlarged prostate. SKELETON: No focal hypermetabolic bone lesions to suggest skeletal metastasis. Incidental CT findings:  None. IMPRESSION: 5 cm hypermetabolic mass in central left upper lobe, consistent with primary bronchogenic carcinoma. This mass involves the left hilum. No evidence of other metastatic disease. 1.2 cm mildly hypermetabolic mass in right parotid gland, consistent with salivary gland neoplasm. Consider ENT consultation. Electronically Signed   By: Norleen DELENA Kil M.D.   On: 07/27/2023 10:51     Assessment & Plan:   1. Mass of upper lobe of left lung (Primary)   Assessment and Plan    Hypermetabolic lung mass A hypermetabolic 5 cm mass in the left upper lung was identified on PET scan with left hilar involvement, raising suspicion for a neoplasm. The mass is new compared to a previous CT scan from a year ago.  - Perform navigational bronchoscopy on August 4th at [facility] with Dr. Shelah - Stop Plavix  on July 30th, five days before the bronchoscopy - Discuss risks of bronchoscopy including infection, bleeding, damage to structures, lung collapse, and need for oxygen - Perform chest x-ray post-bronchoscopy to check for pneumothorax - Schedule follow-up on August 11th with Sarah from the chest team to discuss pathology results  Right parotid mass Mildly hypermetabolic 1.2 cm mass in right parotid gland, consistent with salivary gland neoplasm.  - Consider ENT consultation.  Chronic bronchitis Chronic bronchitis has improved with the use of cetirizine , which is included in his pill pack. He experiences occasional coughing spells, particularly when laughing or sleeping. - Continue cetirizine  as per current regimen - Use Delsym for cough management if needed  Irritable Bowel Syndrome (IBS)  with diarrhea Chronic loose stools due to IBS have improved considerably with the use of probiotics since hospital discharge. - Continue Lactobacillus probiotics daily  Monoclonal gammopathy of undetermined significance (MGUS) - MGUS is being managed by oncology  Mild cognitive impairment Mild cognitive impairment noted, with some regression observed.  - He resides in an independent living facility.    Almarie LELON Ferrari, NP 08/14/2023

## 2023-08-14 NOTE — H&P (View-Only) (Signed)
 @Patient  ID: [name], male    DOB: Nov 23, 1950, 73 y.o.   MRN: [id number]  Chief Complaint  Patient presents with   Follow-up    Referring provider: [name], MD  HPI: 73 year old male, former smoker. PMH significant for CHF, HTN, arthritis, MGUS, mass left upper lung, hyperlipidemia, depression.   08/14/2023- Interim hx Discussed the use of AI scribe software for clinical note transcription with the patient, who gave verbal consent to proceed.  History of Present Illness   [name] is a 73 year old male with a hypermetabolic lung mass who presents for follow-up after hospitalization for near syncope. He is accompanied by [name], his daughter and Transport planner.  In June, he was hospitalized due to near syncope, experiencing a sensation of almost passing out with hypotensive blood pressure at 80/40 mmHg. Associated loose stools secondary to IBS. During the hospital stay, a chest x-ray revealed a density in the left lung, further investigated with a CT scan and PET scan, showing a hypermetabolic 5 cm mass in the left upper lung. He is scheduled for a bronchoscopy on August 4th to further evaluate the mass.  He has a history of chronic loose stools attributed to IBS, which has improved considerably with the use of Lactobacillus probiotics taken daily since his discharge from the hospital. These probiotics are included in his pill packs.  He has a history of semi-chronic bronchitis, which has improved with the use of cetirizine  included in his pill packs. He experiences occasional coughing spells, particularly when laughing or sleeping, but has not had significant respiratory symptoms such as hemoptysis or unexplained weight loss. His weight has been stable, fluctuating between 194 to 197 pounds over the last few months.  He is a former smoker, having quit approximately 36 years ago when his daughter was [age] year old. He smoked from age 30 to 44.  He has mild cognitive  impairment and lives in an independent living facility. There has been some regression in his cognitive abilities.  He follows with oncology for monoclonal gammopathy of undetermined significance (MGUS) and has been receiving regular CT scans for lung cancer screening. The last CT scan a year ago showed slight progression of scarring in the left upper lobe.  He was supposed to undergo a barium swallow test due to difficulty swallowing, but the procedure was not completed as planned.      Allergies  Allergen Reactions   Divalproex Sodium Hives   Lamotrigine Diarrhea and Other (See Comments)    Shakes and unsteadiness     Valproic Acid Hives and Other (See Comments)    Shakes GI problems, too   Amitriptyline Other (See Comments)    Reaction not known   Aspirin  Nausea And Vomiting and Other (See Comments)    Dizziness, weakness and balance issues, and sweats too   Lisinopril Cough         Lithium Diarrhea and Other (See Comments)    Falls over and shakes    Morphine  Other (See Comments)     'not tolerated well   Nsaids Other (See Comments)    Kidney disease   Oxycodone Nausea And Vomiting   Oxycodone-Acetaminophen  Nausea And Vomiting and Other (See Comments)    Also causes dizziness   Pork Allergy Nausea Only     There is no immunization history on file for this patient.  Past Medical History:  Diagnosis Date   (HFpEF) heart failure with preserved ejection fraction (HCC)  Allergy    Anxiety    Arthritis    Chronic kidney disease    Depression    Frequent urination    GERD (gastroesophageal reflux disease)    Gout    Headache    mirgaines in the past   Herpes    History of nuclear stress test    Myoview  05/2021: EF 77, normal perfusion, low risk   Hyperlipidemia    Hypertension    IBS (irritable bowel syndrome)    Memory loss    MGUS (monoclonal gammopathy of unknown significance)    Obesity    Pre-diabetes    Stroke (HCC)    Urgency of urination      Tobacco History: Social History   Tobacco Use  Smoking Status Former  Smokeless Tobacco Never   Counseling given: Not Answered   Outpatient Medications Prior to Visit  Medication Sig Dispense Refill   allopurinol  (ZYLOPRIM ) 100 MG tablet Take 150 mg by mouth daily.     amLODipine  (NORVASC ) 10 MG tablet Take 1 tablet (10 mg total) by mouth daily. 90 tablet 3   amoxicillin (AMOXIL) 500 MG capsule Take 1,000 mg by mouth See admin instructions. Take 1,000 mg by mouth once hour prior to dental appointments     busPIRone  (BUSPAR ) 30 MG tablet Take 30 mg by mouth 2 (two) times daily.     clopidogrel  (PLAVIX ) 75 MG tablet Take 75 mg by mouth daily.     folic acid  (FOLVITE ) 1 MG tablet Take 1 mg by mouth daily.     furosemide  (LASIX ) 40 MG tablet Take 1 tablet (40 mg total) by mouth daily. 90 tablet 3   Hydroxychloroquine  Sulfate 400 MG TABS Take 400 mg by mouth at bedtime.     Lactobacillus (ACIDOPHILUS) CAPS capsule Take 1 capsule by mouth daily. 100 capsule 0   primidone  (MYSOLINE ) 250 MG tablet TAKE 1 TABLET BY MOUTH TWICE A DAY 60 tablet 1   QUEtiapine  (SEROQUEL ) 25 MG tablet Take 25 mg by mouth at bedtime.     rosuvastatin  (CRESTOR ) 40 MG tablet Take 40 mg by mouth daily.     traZODone  (DESYREL ) 100 MG tablet Take 100 mg by mouth at bedtime.     valACYclovir  (VALTREX ) 1000 MG tablet Take 1,000 mg by mouth daily.     Vitamin D , Ergocalciferol , (DRISDOL ) 1.25 MG (50000 UNIT) CAPS capsule Take 50,000 Units by mouth every Saturday.     acetaminophen  (TYLENOL ) 500 MG tablet Take 1-2 tablets (500-1,000 mg total) by mouth every 6 (six) hours as needed for moderate pain. 30 tablet 0   cetirizine  (ZYRTEC  ALLERGY) 10 MG tablet Take 1 tablet (10 mg total) by mouth daily. (Patient taking differently: Take 10 mg by mouth at bedtime.) 30 tablet 0   lidocaine  (LIDODERM ) 5 % Place 1 patch onto the skin daily as needed (for pain- Remove & Discard patch within 12 hours or as directed by MD). (Patient  not taking: Reported on 08/14/2023)     PARoxetine  (PAXIL -CR) 37.5 MG 24 hr tablet Take 1 tablet (37.5 mg total) by mouth at bedtime. (Patient taking differently: Take 1 tablet (37.5 mg total) by mouth at bedtime.) 30 tablet 3   No facility-administered medications prior to visit.    Review of Systems  Review of Systems  Constitutional: Negative.  Negative for fatigue, fever and unexpected weight change.  HENT: Negative.    Respiratory:  Positive for cough. Negative for chest tightness, shortness of breath and wheezing.   Cardiovascular: Negative.  Physical Exam  BP (!) 143/82 (BP Location: Left Arm, Patient Position: Sitting, Cuff Size: Normal)   Pulse 85   Ht 5' 7 (1.702 m)   Wt 200 lb 3.2 oz (90.8 kg)   SpO2 97%   BMI 31.36 kg/m  Physical Exam Constitutional:      General: He is not in acute distress.    Appearance: Normal appearance. He is not ill-appearing.  HENT:     Head: Normocephalic.  Cardiovascular:     Rate and Rhythm: Normal rate.  Pulmonary:     Effort: Pulmonary effort is normal. No respiratory distress.     Breath sounds: Normal breath sounds. No wheezing or rhonchi.  Musculoskeletal:        General: Normal range of motion.  Skin:    General: Skin is warm and dry.  Neurological:     General: No focal deficit present.     Mental Status: He is alert and oriented to person, place, and time. Mental status is at baseline.  Psychiatric:        Mood and Affect: Mood normal.        Behavior: Behavior normal.        Thought Content: Thought content normal.        Judgment: Judgment normal.      Lab Results:  CBC    Component Value Date/Time   WBC 5.0 07/03/2023 0507   RBC 3.08 (L) 07/03/2023 0507   HGB 10.4 (L) 07/03/2023 0507   HGB 11.9 (L) 11/18/2022 1112   HCT 30.6 (L) 07/03/2023 0507   PLT 242 07/03/2023 0507   PLT 301 11/18/2022 1112   MCV 99.4 07/03/2023 0507   MCH 33.8 07/03/2023 0507   MCHC 34.0 07/03/2023 0507   RDW 12.2 07/03/2023  0507   LYMPHSABS 1.7 06/30/2023 2102   MONOABS 0.5 06/30/2023 2102   EOSABS 0.2 06/30/2023 2102   BASOSABS 0.0 06/30/2023 2102    BMET    Component Value Date/Time   NA 140 07/27/2023 1454   K 4.4 07/27/2023 1454   CL 100 07/27/2023 1454   CO2 23 07/27/2023 1454   GLUCOSE 93 07/27/2023 1454   GLUCOSE 100 (H) 07/03/2023 0507   BUN 15 07/27/2023 1454   CREATININE 1.42 (H) 07/27/2023 1454   CREATININE 1.43 (H) 11/18/2022 1112   CALCIUM  9.3 07/27/2023 1454   GFRNONAA 56 (L) 07/03/2023 0507   GFRNONAA 53 (L) 11/18/2022 1112   GFRAA 84 02/12/2020 1022    BNP    Component Value Date/Time   BNP 19.2 12/15/2019 1027    ProBNP    Component Value Date/Time   PROBNP 95 07/17/2020 1048    Imaging: NM PET Image Initial (PI) Skull Base To Thigh Result Date: 07/27/2023 CLINICAL DATA:  Initial treatment strategy for left lung mass. EXAM: NUCLEAR MEDICINE PET SKULL BASE TO THIGH TECHNIQUE: 9.9 mCi F-18 FDG was injected intravenously. Full-ring PET imaging was performed from the skull base to thigh after the radiotracer. CT data was obtained and used for attenuation correction and anatomic localization. Fasting blood glucose: 90 mg/dl COMPARISON:  Chest CT on 06/30/2023 FINDINGS: Mediastinal blood-pool activity (background): SUV max = 2.5 Liver activity (reference): SUV max = N/A NECK: No hypermetabolic lymph nodes. 1.2 cm soft tissue mass in the right parotid gland shows mild hypermetabolism, with SUV max of 2.4. This is consistent with salivary gland neoplasm. Incidental CT findings:  None. CHEST: Irregular mass in the central left upper lobe measures 5.2 x 4.6 cm, and  shows marked hypermetabolism, with SUV max of 22.5. No other suspicious pulmonary nodules identified on CT images. This mass is contiguous with the left hilum. No other sites of hypermetabolic lymphadenopathy identified within the thorax. Incidental CT findings:  None. ABDOMEN/PELVIS: No abnormal hypermetabolic activity within  the liver, pancreas, adrenal glands, or spleen. No hypermetabolic lymph nodes in the abdomen or pelvis. Incidental CT findings: Several small hepatic and bilateral renal cysts noted. Mildly enlarged prostate. SKELETON: No focal hypermetabolic bone lesions to suggest skeletal metastasis. Incidental CT findings:  None. IMPRESSION: 5 cm hypermetabolic mass in central left upper lobe, consistent with primary bronchogenic carcinoma. This mass involves the left hilum. No evidence of other metastatic disease. 1.2 cm mildly hypermetabolic mass in right parotid gland, consistent with salivary gland neoplasm. Consider ENT consultation. Electronically Signed   By: Norleen DELENA Kil M.D.   On: 07/27/2023 10:51     Assessment & Plan:   1. Mass of upper lobe of left lung (Primary)   Assessment and Plan    Hypermetabolic lung mass A hypermetabolic 5 cm mass in the left upper lung was identified on PET scan with left hilar involvement, raising suspicion for a neoplasm. The mass is new compared to a previous CT scan from a year ago.  - Perform navigational bronchoscopy on August 4th at [facility] with Dr. Shelah - Stop Plavix  on July 30th, five days before the bronchoscopy - Discuss risks of bronchoscopy including infection, bleeding, damage to structures, lung collapse, and need for oxygen - Perform chest x-ray post-bronchoscopy to check for pneumothorax - Schedule follow-up on August 11th with [name] from the chest team to discuss pathology results  Right parotid mass Mildly hypermetabolic 1.2 cm mass in right parotid gland, consistent with salivary gland neoplasm.  - Consider ENT consultation.  Chronic bronchitis Chronic bronchitis has improved with the use of cetirizine , which is included in his pill pack. He experiences occasional coughing spells, particularly when laughing or sleeping. - Continue cetirizine  as per current regimen - Use Delsym for cough management if needed  Irritable Bowel Syndrome (IBS)  with diarrhea Chronic loose stools due to IBS have improved considerably with the use of probiotics since hospital discharge. - Continue Lactobacillus probiotics daily  Monoclonal gammopathy of undetermined significance (MGUS) - MGUS is being managed by oncology  Mild cognitive impairment Mild cognitive impairment noted, with some regression observed.  - He resides in an independent living facility.    Christopher LELON Ferrari, NP 08/14/2023

## 2023-08-14 NOTE — Addendum Note (Signed)
 Addended by: VERTA LAURAINE POUR on: 08/14/2023 12:46 PM   Modules accepted: Orders

## 2023-08-14 NOTE — Telephone Encounter (Signed)
 Pt daughter called back in about this message, please advise.

## 2023-08-14 NOTE — Telephone Encounter (Signed)
 Per OV notes:   07/26/23  2:48 PM Note DOD- Dr Delford reviewed patient's information. Orders to increase Amlodipine  to 10 mg. (Dr Delford would like to have pt take hydralazine  25 mg bid prn for systolic 150 or over or diastolic 90 or more;  but was informed by pt's daughter that the patient cannot have a prn medication due to receiving medication in pill pack and cognitive decline- Dr Delford agreed to only increase the Amlodipine  at this time and monitor BP for 2 weeks. After 2 weeks, if BP's are still increased then may add this medication bid)     Santo Stanly LABOR, MD to [facility] Triage  Randy Hamp SAILOR, RN     07/26/23  3:02 PM Sounds good.  We will start hydralazine  as next medication if BP is persistently elevated.  Let's also get this information to PCP who leads HTN management.  Routing to [place] to see if we're good to go ahead and send it Hydralazine  25 BID since BP is still trending upper 150's systolic.

## 2023-08-14 NOTE — Patient Instructions (Addendum)
 PET CT showed hypermetabolic 5cm mass central left upper lone, consistent with primary bronchogenic carcinoma. Patient is scheduled for bronchoscopy on 08/21/23 at [facility] with Dr. Shelah. Stop plavix  5 days before bronchoscopy    Today, you came in for a follow-up visit after your recent hospitalization for near syncope. We discussed your hypermetabolic lung mass, chronic bronchitis, irritable bowel syndrome (IBS) with diarrhea, monoclonal gammopathy of undetermined significance (MGUS), and mild cognitive impairment. Your daughter [name] accompanied you during the visit.  YOUR PLAN: -HYPERMETABOLIC LUNG MASS: A hypermetabolic lung mass is a growth in the lung that shows increased metabolic activity, which can be a sign of cancer. You will have a bronchoscopy on August 4th to further evaluate the mass. Please stop taking Plavix  on July 30th, five days before the procedure. We discussed the risks of bronchoscopy, including infection, bleeding, damage to structures, lung collapse, and the need for oxygen. A chest x-ray will be done after the bronchoscopy to check for any complications. You have a follow-up appointment on August 11th with Sarah from the chest team to discuss the pathology results.  -CHRONIC BRONCHITIS: Chronic bronchitis is a long-term inflammation of the airways in the lungs. Your condition has improved with the use of cetirizine , which you should continue taking as per your current regimen. You can use Delsym if you need additional help managing your cough.   -IRRITABLE BOWEL SYNDROME (IBS) WITH DIARRHEA: IBS with diarrhea is a condition that affects the digestive system, causing chronic loose stools. Your symptoms have improved with the daily use of Lactobacillus probiotics, which you should continue taking.  -MONOCLONAL GAMMOPATHY OF UNDETERMINED SIGNIFICANCE (MGUS): MGUS is a condition where an abnormal protein is found in the blood. It is being managed by your oncology team.  -MILD  COGNITIVE IMPAIRMENT: Mild cognitive impairment is a slight but noticeable decline in cognitive abilities. You are currently living in an independent living facility, and we will continue to monitor your condition. You will need observation for 24 hours after bronchoscopy.   INSTRUCTIONS: Please stop taking Plavix  on July 30th, five days before your bronchoscopy on August 4th. After the bronchoscopy, a chest x-ray will be done to check for any complications. You have a follow-up appointment on August 11th with Sarah from the chest team to discuss the pathology results.   Flexible Bronchoscopy  Flexible bronchoscopy is a procedure used to examine the passageways in the lungs. During the procedure, a thin, flexible tool with a camera (bronchoscope) is passed into the mouth or nose, down through the windpipe (trachea), and into the air tubes in the lungs (bronchi). This tool allows the health care provider to look inside the lungs and to take samples for testing, if needed. Tell a health care provider about: Any allergies you have. All medicines you are taking, including vitamins, herbs, eye drops, creams, and over-the-counter medicines. Any problems you or family members have had with anesthetic medicines. Any bleeding problems you have. Any surgeries you have had. Any medical conditions you have. Whether you are pregnant or may be pregnant. What are the risks? Your healthcare provider will talk with you about risks. These may include: Infection. Bleeding. Damage to other structures or organs. Allergic reactions to medicines. Collapsed lung (pneumothorax). Increased need for oxygen or difficulty breathing after the procedure. What happens before the procedure? When to stop eating and drinking Follow instructions from your health care provider about what you may eat and drink. These may include: 8 hours before your procedure Stop eating  most foods. Do not eat meat, fried foods, or fatty  foods. Eat only light foods, such as toast or crackers. All liquids are okay except energy drinks and alcohol . 6 hours before your procedure Stop eating. Drink only clear liquids, such as water , clear fruit juice, black coffee, plain tea, and sports drinks. Do not drink energy drinks or alcohol . 2 hours before your procedure Stop drinking all liquids. You may be allowed to take medicines with small sips of water . If you do not follow your health care provider's instructions, your procedure may be delayed or canceled. Medicines Ask your health care provider about: Changing or stopping your regular medicines. These include any diabetes medicines or blood thinners you take. Taking medicines such as aspirin  and ibuprofen. These medicines can thin your blood. Do not take them unless your health care provider tells you to. Taking over-the-counter medicines, vitamins, herbs, and supplements. General instructions You may be given antibiotic medicine to help lower the risk of infection. If you will be going home right after the procedure, plan to have a responsible adult: Take you home from the hospital or clinic. You will not be allowed to drive. Care for you for the time you are told. What happens during the procedure? An IV will be inserted into one of your veins. You will be given a medicine (local anesthetic) to numb your mouth, nose, throat, and voice box (larynx). You may also be given one or more of the following: A medicine to help you relax (sedative). A medicine to control coughing. A medicine to dry up any fluids or secretions in your lungs. A bronchoscope will be passed into your nose or mouth, and into your lungs. Your health care provider will examine your lungs. Samples of airway secretions may be collected for testing. If abnormal areas are seen in your airways, samples of tissue may be removed and checked under a microscope (biopsy). If tissue samples are needed from the outer  parts of the lung, a type of X-ray (fluoroscopy) may be used to guide the bronchoscope to these areas. If bleeding occurs, you may be given medicine to stop or decrease the bleeding. The procedure may vary among health care providers and hospitals. What happens after the procedure? Your blood pressure, heart rate, breathing rate, and blood oxygen level will be monitored until you leave the hospital or clinic. You may have a chest X-ray to check for signs of pneumothorax. You willnot be allowed to eat or drink anything for 2 hours after your procedure. If a biopsy was taken, it is up to you to get the results of the test. Ask your health care provider, or the department that is doing the procedure, when your results will be ready. Contact a health care provider if: You have a fever. Get help right away if: You have shortness of breath that gets worse. You get light-headed or feel like you might faint. You have chest pain. You cough up more than a small amount of blood. These symptoms may be an emergency. Get help right away. Call 911. Do not wait to see if the symptoms will go away. Do not drive yourself to the hospital. Summary Flexible bronchoscopy is a procedure that allows your health care provider to look closely inside your lungs and to take testing samples if needed. Risks of flexible bronchoscopy include bleeding, infection, and collapsed lung (pneumothorax). Before the procedure, you will be given a medicine to numb your mouth, nose, throat, and voice box. Then,  a bronchoscope will be passed into your nose or mouth, and into your lungs. After the procedure, your blood pressure, heart rate, breathing rate, and blood oxygen level will be monitored until you leave the hospital or clinic. You may have a chest X-ray to check for signs of pneumothorax. This information is not intended to replace advice given to you by your health care provider. Make sure you discuss any questions you have  with your health care provider. Document Revised: 04/13/2021 Document Reviewed: 04/13/2021 Elsevier Patient Education  2024 [employer].

## 2023-08-16 ENCOUNTER — Telehealth: Payer: Self-pay | Admitting: Internal Medicine

## 2023-08-16 MED ORDER — HYDRALAZINE HCL 50 MG PO TABS: 50.0000 mg | ORAL_TABLET | Freq: Three times a day (TID) | ORAL | 3 refills | Status: DC

## 2023-08-16 MED ORDER — HYDRALAZINE HCL 50 MG PO TABS: 75.0000 mg | ORAL_TABLET | Freq: Two times a day (BID) | ORAL | 3 refills | Status: DC

## 2023-08-16 NOTE — Telephone Encounter (Signed)
 Verbally spoke with MD reports the following: Given pt BP log, suspects BID dosing will lead to persistently elevated BP.  But d/t daughter concern with twice daily dosing is willing to give 75 mg PO BID a try.  Pt has f/u with Pharm D 09/19/23.   Called pt daughter advised of the above.  Daughter is thankful for response and will contact our office if further assistance is needed. New script for hydralazine  75 mg PO BID sent to pharmacy.

## 2023-08-16 NOTE — Telephone Encounter (Signed)
 Pt daughter reports pt will not take a mid day medication.  Medication comes in a bubble pack.  Daughter reports pharmacy that prepares medications recommends hydralazine  75 mg PO BID.  Advised daughter will send to MD to okay.   Daughter is requesting a call back ASAP.

## 2023-08-16 NOTE — Telephone Encounter (Signed)
 Reason for walk-in: Walk-in Reasons: medication refill----Daughter, [name], is here insisting on talking to someone about patients hydralazine .  She states that she has had several mychart messages sent to nurse regarding changing medication dosage from three times a day to twice a day with no success.  She decided to come to the office to speak with the nurse in person.  She states that the pt will not remember to take the middle dose of medication.  If patient is requesting to be seen today, or if patient is having symptoms:  What symptoms are being reported (if any)?  Not having symptoms  Route to triage pool and ensure Teams message has been sent to the Triage Walk-In chat.  3.   For medication samples, medication refills, HIM requests, appointment requests, lab-related requests, or form/record drop-off, please route to the appropriate pool.

## 2023-08-16 NOTE — Telephone Encounter (Signed)
 Pt c/o medication issue:  1. Name of Medication:   hydrALAZINE  (APRESOLINE ) 50 MG tablet   2. How are you currently taking this medication (dosage and times per day)?   3. Are you having a reaction (difficulty breathing--STAT)?   4. What is your medication issue?    Daughter Merri) wants a call back directly from RN [facility] regarding patient taking this medication 3 times daily.

## 2023-08-16 NOTE — Telephone Encounter (Signed)
 I am closing this encounter there are 2 encounters regarding this topic.

## 2023-08-16 NOTE — Addendum Note (Signed)
 Addended by: RANDY HAMP SAILOR on: 08/16/2023 02:26 PM   Modules accepted: Orders

## 2023-08-17 NOTE — Telephone Encounter (Signed)
 Pt daughter walk into clinic to discuss care please see walk in encounter for more details.

## 2023-08-18 ENCOUNTER — Encounter (HOSPITAL_COMMUNITY): Payer: Self-pay | Admitting: Emergency Medicine

## 2023-08-18 ENCOUNTER — Other Ambulatory Visit: Payer: Self-pay

## 2023-08-18 NOTE — Anesthesia Preprocedure Evaluation (Addendum)
 Anesthesia Evaluation  Patient identified by MRN, date of birth, ID band Patient awake    Reviewed: Allergy & Precautions, NPO status , Patient's Chart, lab work & pertinent test results  Airway Mallampati: III  TM Distance: >3 FB Neck ROM: Full    Dental  (+) Dental Advisory Given, Chipped, Missing, Poor Dentition   Pulmonary former smoker LUL mass   breath sounds clear to auscultation       Cardiovascular hypertension, Pt. on medications  Rhythm:Regular Rate:Normal  06/2023 ECHO: 1. Left ventricular ejection fraction, by estimation, is 65%. Left ventricular ejection fraction by 3D volume is 66 %. The left ventricle has normal function. The left ventricle has no regional wall motion abnormalities. There is mild left ventricular  hypertrophy of the inferior and septal segments. Left ventricular diastolic parameters are consistent with Grade II diastolic dysfunction (pseudonormalization). The average left ventricular global longitudinal strain is -22.0 %. The global longitudinal  strain is normal.  2. Right ventricular systolic function is normal. The right ventricular size is mildly enlarged. Mildly increased right ventricular wall thickness.  3. Left atrial size was severely dilated.  4. The mitral valve is normal in structure. No evidence of mitral valve regurgitation. No evidence of mitral stenosis.  5. The aortic valve is tricuspid. There is mild calcification of the aortic valve. Aortic valve regurgitation is not visualized. Aortic valve sclerosis is present, with no evidence of aortic valve stenosis.  6. The inferior vena cava is normal in size with greater than 50% respiratory variability, suggesting right atrial pressure of 3 mmHg.   Comparison(s): Prior images reviewed side by side. Left ventricular function is slightly less vigorous.    Neuro/Psych  Headaches PSYCHIATRIC DISORDERS Anxiety Depression    CVA, No Residual  Symptoms    GI/Hepatic ,GERD  Controlled,,  Endo/Other  BMI 30.5  Renal/GU Renal InsufficiencyRenal disease     Musculoskeletal  (+) Arthritis ,    Abdominal  (+) + obese  Peds  Hematology plavix    Anesthesia Other Findings   Reproductive/Obstetrics                              Anesthesia Physical Anesthesia Plan  ASA: 3  Anesthesia Plan: General   Post-op Pain Management: Minimal or no pain anticipated and Tylenol  PO (pre-op)*   Induction: Intravenous  PONV Risk Score and Plan: 2 and Ondansetron , Dexamethasone , TIVA and Treatment may vary due to age or medical condition  Airway Management Planned: Oral ETT  Additional Equipment: None  Intra-op Plan:   Post-operative Plan: Extubation in OR  Informed Consent: I have reviewed the patients History and Physical, chart, labs and discussed the procedure including the risks, benefits and alternatives for the proposed anesthesia with the patient or authorized representative who has indicated his/her understanding and acceptance.     Dental advisory given  Plan Discussed with: CRNA  Anesthesia Plan Comments: (PAT note by Lynwood Hope, PA-C: 73 y.o. male with medical history significant of hypertension, hyperlipidemia, MCI, right brain subcortical infarct in 2011,  HFpEF, CKDIII, GERD on PPI, HTN,HLD, MGUS, prediabetes.  Recent admission 6/13 through 07/03/2023 after presenting with near syncope.  Per EMS, BP was 80/40.  Labs showed AKI on CKD 3 with creatinine 2 on admission (baseline ~1.4).  He was hydrated with improvement.  Echo showed diastolic dysfunction but preserved EF.  Repeat orthostatics were negative.  He was incidentally found to have a soft tissue mass in the  left suprahilar region suspicious for pulmonary neoplasm.  Outpatient pulmonary follow-up was arranged.  He was seen in follow-up by cardiologist Dr. Arnetha on 07/13/2023.  He was noted to have labile hypertension with  recent episode of hypotension resulting in near syncope.  His candesartan, hydralazine , metoprolol  succinate, and spironolactone  were discontinued.  He was continued on amlodipine  5 mg daily and restarted on Lasix .  Patient was seen in outpatient follow-up by pulmonology on 08/14/2023 and recommended to undergo bronchoscopy with biopsy of hypermetabolic 5 cm left upper lobe mass.  Patient reports last dose Plavix  08/15/2023.  BMP 07/27/23 reviewed, creatinine mildly elevated 1.42, otherwise unremarkable.  Patient will need day of surgery labs and evaluation.  EKG 06/30/2023: Sinus rhythm.  Rate 60. Prolonged PR interval. RBBB and LAFB  TTE 07/01/2023: 1. Left ventricular ejection fraction, by estimation, is 65%. Left  ventricular ejection fraction by 3D volume is 66 %. The left ventricle has  normal function. The left ventricle has no regional wall motion  abnormalities. There is mild left ventricular  hypertrophy of the inferior and septal segments. Left ventricular  diastolic parameters are consistent with Grade II diastolic dysfunction  (pseudonormalization). The average left ventricular global longitudinal  strain is -22.0 %. The global longitudinal  strain is normal.  2. Right ventricular systolic function is normal. The right ventricular  size is mildly enlarged. Mildly increased right ventricular wall  thickness.  3. Left atrial size was severely dilated.  4. The mitral valve is normal in structure. No evidence of mitral valve  regurgitation. No evidence of mitral stenosis.  5. The aortic valve is tricuspid. There is mild calcification of the  aortic valve. Aortic valve regurgitation is not visualized. Aortic valve  sclerosis is present, with no evidence of aortic valve stenosis.  6. The inferior vena cava is normal in size with greater than 50%  respiratory variability, suggesting right atrial pressure of 3 mmHg.   Comparison(s): Prior images reviewed side by side. Left  ventricular  function is slightly less vigorous.   Nuclear stress 05/20/2021:   The study is normal. The study is low risk.   No ST deviation was noted.   LV perfusion is normal.   Left ventricular function is normal. Nuclear stress EF: 77 %. The left ventricular ejection fraction is hyperdynamic (>65%). End diastolic cavity size is normal. End systolic cavity size is normal.   Prior study not available for comparison.  Normal perfusion. LVEF 77% with normal wall motion. This is a low risk study. No prior for comparison.   )         Anesthesia Quick Evaluation

## 2023-08-18 NOTE — Progress Notes (Signed)
 SDW call  Patient requested I speak to his daughter, MARYJO, [name]. She was given pre-op instructions over the phone and verbalized understanding of instructions provided. Whitney states the pharmacy makes daily blister packs for his medications for the week.  They are not labeled or able to separate his Lasix  to not take the DOS. Message sent to Dr. Shelah.      PCP - Dr. Aurora Molt Cardiologist - Dr. Stanly Leavens Pulmonary:    PPM/ICD - denies Device Orders - na Rep Notified - na   Chest x-ray - 06/30/2023 EKG -  07/03/2023 Stress Test - 05/2021 ECHO - 07/01/2023 Cardiac Cath -   Sleep Study/sleep apnea/CPAP: denies  Non-diabetic  Blood Thinner Instructions: Plavix , states last dose 08/15/2023 Aspirin  Instructions: denies   ERAS Protcol - NPO   Anesthesia review: Yes. HF, HTN, stroke, HLD   Patient denies shortness of breath, fever, cough and chest pain over the phone call  Your procedure is scheduled on Monday August 21, 2023  Report to [facility] Main Entrance A at  0830  A.M., then check in with the Admitting office.  Call this number if you have problems the morning of surgery:  [phone]   If you have any questions prior to your surgery date call [phone]: Open Monday-Friday 8am-4pm If you experience any cold or flu symptoms such as cough, fever, chills, shortness of breath, etc. between now and your scheduled surgery, please notify us  at the above number    Remember:  Do not eat or drink after midnight the night before your surgery  Take these medicines the morning of surgery with A SIP OF WATER :  Allopurinol , amlodipine , buspar , hydralazine , hydroxychloroquine , primidone , crestor , valtrex   As needed: tylenol   As of today, STOP taking any Aspirin  (unless otherwise instructed by your surgeon) Aleve, Naproxen, Ibuprofen, Motrin, Advil, Goody's, BC's, all herbal medications, fish oil, and all vitamins.

## 2023-08-18 NOTE — Progress Notes (Signed)
 Anesthesia Chart Review: Same day workup  73 y.o. male with medical history significant of hypertension, hyperlipidemia, MCI, right brain subcortical infarct in 2011,  HFpEF, CKDIII, GERD on PPI, HTN,HLD, MGUS, prediabetes.  Recent admission 6/13 through 07/03/2023 after presenting with near syncope.  Per EMS, BP was 80/40.  Labs showed AKI on CKD 3 with creatinine 2 on admission (baseline ~1.4).  He was hydrated with improvement.  Echo showed diastolic dysfunction but preserved EF.  Repeat orthostatics were negative.  He was incidentally found to have a soft tissue mass in the left suprahilar region suspicious for pulmonary neoplasm.  Outpatient pulmonary follow-up was arranged.  He was seen in follow-up by cardiologist Dr. Arnetha on 07/13/2023.  He was noted to have labile hypertension with recent episode of hypotension resulting in near syncope.  His candesartan, hydralazine , metoprolol  succinate, and spironolactone  were discontinued.  He was continued on amlodipine  5 mg daily and restarted on Lasix .  Patient was seen in outpatient follow-up by pulmonology on 08/14/2023 and recommended to undergo bronchoscopy with biopsy of hypermetabolic 5 cm left upper lobe mass.  Patient reports last dose Plavix  08/15/2023.  BMP 07/27/23 reviewed, creatinine mildly elevated 1.42, otherwise unremarkable.  Patient will need day of surgery labs and evaluation.  EKG 06/30/2023: Sinus rhythm.  Rate 60. Prolonged PR interval. RBBB and LAFB  TTE 07/01/2023: 1. Left ventricular ejection fraction, by estimation, is 65%. Left  ventricular ejection fraction by 3D volume is 66 %. The left ventricle has  normal function. The left ventricle has no regional wall motion  abnormalities. There is mild left ventricular  hypertrophy of the inferior and septal segments. Left ventricular  diastolic parameters are consistent with Grade II diastolic dysfunction  (pseudonormalization). The average left ventricular global  longitudinal  strain is -22.0 %. The global longitudinal  strain is normal.   2. Right ventricular systolic function is normal. The right ventricular  size is mildly enlarged. Mildly increased right ventricular wall  thickness.   3. Left atrial size was severely dilated.   4. The mitral valve is normal in structure. No evidence of mitral valve  regurgitation. No evidence of mitral stenosis.   5. The aortic valve is tricuspid. There is mild calcification of the  aortic valve. Aortic valve regurgitation is not visualized. Aortic valve  sclerosis is present, with no evidence of aortic valve stenosis.   6. The inferior vena cava is normal in size with greater than 50%  respiratory variability, suggesting right atrial pressure of 3 mmHg.   Comparison(s): Prior images reviewed side by side. Left ventricular  function is slightly less vigorous.   Nuclear stress 05/20/2021:   The study is normal. The study is low risk.   No ST deviation was noted.   LV perfusion is normal.   Left ventricular function is normal. Nuclear stress EF: 77 %. The left ventricular ejection fraction is hyperdynamic (>65%). End diastolic cavity size is normal. End systolic cavity size is normal.   Prior study not available for comparison.   Normal perfusion. LVEF 77% with normal wall motion. This is a low risk study. No prior for comparison.     Lynwood Geofm RIGGERS [facility] Short Stay Center/Anesthesiology Phone [phone] 08/18/2023 10:42 AM

## 2023-08-21 ENCOUNTER — Ambulatory Visit (HOSPITAL_BASED_OUTPATIENT_CLINIC_OR_DEPARTMENT_OTHER): Payer: Self-pay | Admitting: Physician Assistant

## 2023-08-21 ENCOUNTER — Ambulatory Visit (HOSPITAL_COMMUNITY): Payer: Self-pay | Admitting: Physician Assistant

## 2023-08-21 ENCOUNTER — Ambulatory Visit (HOSPITAL_COMMUNITY)

## 2023-08-21 ENCOUNTER — Encounter (HOSPITAL_COMMUNITY)
Admission: RE | Disposition: A | Discharge status: Home / Self Care | Payer: Self-pay | Source: Home / Self Care | Attending: Emergency Medicine

## 2023-08-21 ENCOUNTER — Other Ambulatory Visit: Payer: Self-pay

## 2023-08-21 ENCOUNTER — Encounter (HOSPITAL_COMMUNITY): Payer: Self-pay | Admitting: Emergency Medicine

## 2023-08-21 ENCOUNTER — Ambulatory Visit (HOSPITAL_COMMUNITY)
Admission: RE | Admit: 2023-08-21 | Discharge: 2023-08-21 | Disposition: A | Discharge status: Home / Self Care | Attending: Emergency Medicine | Admitting: Emergency Medicine

## 2023-08-21 DIAGNOSIS — N183 Chronic kidney disease, stage 3 unspecified: Secondary | ICD-10-CM | POA: Insufficient documentation

## 2023-08-21 DIAGNOSIS — G3184 Mild cognitive impairment, so stated: Secondary | ICD-10-CM | POA: Insufficient documentation

## 2023-08-21 DIAGNOSIS — F419 Anxiety disorder, unspecified: Secondary | ICD-10-CM | POA: Diagnosis not present

## 2023-08-21 DIAGNOSIS — R918 Other nonspecific abnormal finding of lung field: Secondary | ICD-10-CM

## 2023-08-21 DIAGNOSIS — M1990 Unspecified osteoarthritis, unspecified site: Secondary | ICD-10-CM | POA: Diagnosis not present

## 2023-08-21 DIAGNOSIS — C3412 Malignant neoplasm of upper lobe, left bronchus or lung: Secondary | ICD-10-CM | POA: Diagnosis not present

## 2023-08-21 DIAGNOSIS — E785 Hyperlipidemia, unspecified: Secondary | ICD-10-CM | POA: Diagnosis not present

## 2023-08-21 DIAGNOSIS — I5032 Chronic diastolic (congestive) heart failure: Secondary | ICD-10-CM | POA: Insufficient documentation

## 2023-08-21 DIAGNOSIS — K219 Gastro-esophageal reflux disease without esophagitis: Secondary | ICD-10-CM | POA: Insufficient documentation

## 2023-08-21 DIAGNOSIS — D472 Monoclonal gammopathy: Secondary | ICD-10-CM | POA: Insufficient documentation

## 2023-08-21 DIAGNOSIS — I130 Hypertensive heart and chronic kidney disease with heart failure and stage 1 through stage 4 chronic kidney disease, or unspecified chronic kidney disease: Secondary | ICD-10-CM | POA: Insufficient documentation

## 2023-08-21 DIAGNOSIS — K580 Irritable bowel syndrome with diarrhea: Secondary | ICD-10-CM | POA: Insufficient documentation

## 2023-08-21 DIAGNOSIS — J42 Unspecified chronic bronchitis: Secondary | ICD-10-CM | POA: Insufficient documentation

## 2023-08-21 DIAGNOSIS — F3342 Major depressive disorder, recurrent, in full remission: Secondary | ICD-10-CM

## 2023-08-21 DIAGNOSIS — Z87891 Personal history of nicotine dependence: Secondary | ICD-10-CM | POA: Diagnosis not present

## 2023-08-21 DIAGNOSIS — Z8673 Personal history of transient ischemic attack (TIA), and cerebral infarction without residual deficits: Secondary | ICD-10-CM | POA: Insufficient documentation

## 2023-08-21 DIAGNOSIS — I5033 Acute on chronic diastolic (congestive) heart failure: Secondary | ICD-10-CM | POA: Diagnosis not present

## 2023-08-21 DIAGNOSIS — F32A Depression, unspecified: Secondary | ICD-10-CM | POA: Insufficient documentation

## 2023-08-21 HISTORY — PX: VIDEO BRONCHOSCOPY WITH ENDOBRONCHIAL NAVIGATION: SHX6175

## 2023-08-21 HISTORY — PX: VIDEO BRONCHOSCOPY WITH RADIAL ENDOBRONCHIAL ULTRASOUND: SHX6849

## 2023-08-21 HISTORY — PX: VIDEO BRONCHOSCOPY WITH ENDOBRONCHIAL ULTRASOUND: SHX6177

## 2023-08-21 LAB — CBC
HCT: 39 % (ref 39.0–52.0)
Hemoglobin: 13.2 g/dL (ref 13.0–17.0)
MCH: 33.1 pg (ref 26.0–34.0)
MCHC: 33.8 g/dL (ref 30.0–36.0)
MCV: 97.7 fL (ref 80.0–100.0)
Platelets: 297 K/uL (ref 150–400)
RBC: 3.99 MIL/uL — ABNORMAL LOW (ref 4.22–5.81)
RDW: 12.6 % (ref 11.5–15.5)
WBC: 7.9 K/uL (ref 4.0–10.5)
nRBC: 0 % (ref 0.0–0.2)

## 2023-08-21 SURGERY — VIDEO BRONCHOSCOPY WITH ENDOBRONCHIAL NAVIGATION
Anesthesia: General | Laterality: Left

## 2023-08-21 MED ORDER — CHLORHEXIDINE GLUCONATE 0.12 % MT SOLN
15.0000 mL | Freq: Once | OROMUCOSAL | Status: AC
  Administered 2023-08-21: 15 mL via OROMUCOSAL
  Filled 2023-08-21: qty 15

## 2023-08-21 MED ORDER — ROCURONIUM BROMIDE 10 MG/ML (PF) SYRINGE
PREFILLED_SYRINGE | INTRAVENOUS | Status: DC | PRN
  Administered 2023-08-21 (×2): 10 mg via INTRAVENOUS
  Administered 2023-08-21: 50 mg via INTRAVENOUS

## 2023-08-21 MED ORDER — SUGAMMADEX SODIUM 200 MG/2ML IV SOLN
INTRAVENOUS | Status: DC | PRN
  Administered 2023-08-21: 180 mg via INTRAVENOUS

## 2023-08-21 MED ORDER — ACETAMINOPHEN 500 MG PO TABS
1000.0000 mg | ORAL_TABLET | Freq: Once | ORAL | Status: AC
  Administered 2023-08-21: 1000 mg via ORAL
  Filled 2023-08-21: qty 2

## 2023-08-21 MED ORDER — LIDOCAINE 2% (20 MG/ML) 5 ML SYRINGE
INTRAMUSCULAR | Status: DC | PRN
  Administered 2023-08-21: 40 mg via INTRAVENOUS

## 2023-08-21 MED ORDER — PHENYLEPHRINE HCL-NACL 20-0.9 MG/250ML-% IV SOLN
INTRAVENOUS | Status: DC | PRN
  Administered 2023-08-21: 40 ug/min via INTRAVENOUS

## 2023-08-21 MED ORDER — MIDAZOLAM HCL 2 MG/2ML IJ SOLN: 0.5000 mg | Freq: Once | INTRAMUSCULAR | Status: DC | PRN

## 2023-08-21 MED ORDER — PAROXETINE HCL ER 37.5 MG PO TB24: 37.5000 mg | ORAL_TABLET | Freq: Every day | ORAL | Status: AC

## 2023-08-21 MED ORDER — PROPOFOL 500 MG/50ML IV EMUL
INTRAVENOUS | Status: DC | PRN
  Administered 2023-08-21: 100 ug/kg/min via INTRAVENOUS

## 2023-08-21 MED ORDER — GLYCOPYRROLATE PF 0.2 MG/ML IJ SOSY
PREFILLED_SYRINGE | INTRAMUSCULAR | Status: DC | PRN
  Administered 2023-08-21: .2 mg via INTRAVENOUS

## 2023-08-21 MED ORDER — FENTANYL CITRATE (PF) 100 MCG/2ML IJ SOLN: 25.0000 ug | INTRAMUSCULAR | Status: DC | PRN

## 2023-08-21 MED ORDER — CLOPIDOGREL BISULFATE 75 MG PO TABS: 75.0000 mg | ORAL_TABLET | Freq: Every day | ORAL | Status: AC

## 2023-08-21 MED ORDER — DEXAMETHASONE SODIUM PHOSPHATE 10 MG/ML IJ SOLN
INTRAMUSCULAR | Status: DC | PRN
  Administered 2023-08-21: 10 mg via INTRAVENOUS

## 2023-08-21 MED ORDER — LACTATED RINGERS IV SOLN: INTRAVENOUS | Status: DC

## 2023-08-21 MED ORDER — PROPOFOL 10 MG/ML IV BOLUS
INTRAVENOUS | Status: DC | PRN
  Administered 2023-08-21: 120 mg via INTRAVENOUS
  Administered 2023-08-21 (×2): 15 mg via INTRAVENOUS
  Administered 2023-08-21: 30 mg via INTRAVENOUS
  Administered 2023-08-21: 10 mg via INTRAVENOUS

## 2023-08-21 SURGICAL SUPPLY — 36 items
ADAPTER BRONCHOSCOPE OLYMPUS (ADAPTER) ×2 IMPLANT
ADAPTER VALVE BIOPSY EBUS (MISCELLANEOUS) IMPLANT
BAG COUNTER SPONGE SURGICOUNT (BAG) ×2 IMPLANT
BRUSH CYTOL CELLEBRITY 1.5X140 (MISCELLANEOUS) ×2 IMPLANT
BRUSH SUPERTRAX BIOPSY (INSTRUMENTS) IMPLANT
BRUSH SUPERTRAX NDL-TIP CYTO (INSTRUMENTS) ×2 IMPLANT
CANISTER SUCTION 3000ML PPV (SUCTIONS) ×2 IMPLANT
CNTNR URN SCR LID CUP LEK RST (MISCELLANEOUS) ×2 IMPLANT
COVER BACK TABLE 60X90IN (DRAPES) ×2 IMPLANT
FILTER STRAW FLUID ASPIR (MISCELLANEOUS) IMPLANT
FORCEPS BIOP 1.5 SINGLE USE (MISCELLANEOUS) ×2 IMPLANT
FORCEPS BIOP SUPERTRX PREMAR (INSTRUMENTS) ×2 IMPLANT
GAUZE SPONGE 4X4 12PLY STRL (GAUZE/BANDAGES/DRESSINGS) ×2 IMPLANT
GLOVE BIO SURGEON STRL SZ7.5 (GLOVE) ×4 IMPLANT
GOWN STRL REUS W/ TWL LRG LVL3 (GOWN DISPOSABLE) ×4 IMPLANT
KIT CLEAN ENDO COMPLIANCE (KITS) ×2 IMPLANT
KIT LOCATABLE GUIDE (CANNULA) IMPLANT
KIT MARKER FIDUCIAL DELIVERY (KITS) IMPLANT
KIT TURNOVER KIT B (KITS) ×2 IMPLANT
MARKER SKIN DUAL TIP RULER LAB (MISCELLANEOUS) ×2 IMPLANT
NDL SUPERTRX PREMARK BIOPSY (NEEDLE) ×2 IMPLANT
NEEDLE SUPERTRX PREMARK BIOPSY (NEEDLE) ×2 IMPLANT
NS IRRIG 1000ML POUR BTL (IV SOLUTION) ×2 IMPLANT
OIL SILICONE PENTAX (PARTS (SERVICE/REPAIRS)) ×2 IMPLANT
PAD ARMBOARD POSITIONER FOAM (MISCELLANEOUS) ×4 IMPLANT
PATCHES PATIENT (LABEL) ×6 IMPLANT
SYR 20ML ECCENTRIC (SYRINGE) ×2 IMPLANT
SYR 20ML LL LF (SYRINGE) ×2 IMPLANT
SYR 50ML SLIP (SYRINGE) ×2 IMPLANT
TOWEL GREEN STERILE FF (TOWEL DISPOSABLE) ×2 IMPLANT
TRAP SPECIMEN MUCUS 40CC (MISCELLANEOUS) IMPLANT
TUBE CONNECTING 20X1/4 (TUBING) ×2 IMPLANT
UNDERPAD 30X36 HEAVY ABSORB (UNDERPADS AND DIAPERS) ×2 IMPLANT
VALVE BIOPSY SINGLE USE (MISCELLANEOUS) ×2 IMPLANT
VALVE SUCTION BRONCHIO DISP (MISCELLANEOUS) ×2 IMPLANT
WATER STERILE IRR 1000ML POUR (IV SOLUTION) ×2 IMPLANT

## 2023-08-21 NOTE — Progress Notes (Signed)
 Patient's daughter would like to be called with results of procedure prior to calling the patient to ensure he has someone with him.

## 2023-08-21 NOTE — Interval H&P Note (Signed)
 History and Physical Interval Note:  08/21/2023 9:03 AM  [name]  has presented today for surgery, with the diagnosis of Left upper lobe mass.  The various methods of treatment have been discussed with the patient and family. After consideration of risks, benefits and other options for treatment, the patient has consented to  Procedure(s): VIDEO BRONCHOSCOPY WITH ENDOBRONCHIAL NAVIGATION (Left) as a surgical intervention.  The patient's history has been reviewed, patient examined, no change in status, stable for surgery.  I have reviewed the patient's chart and labs.  Questions were answered to the patient's satisfaction.     Lamar GORMAN Chris

## 2023-08-21 NOTE — Discharge Instructions (Addendum)
 Flexible Bronchoscopy, Care After This sheet gives you information about how to care for yourself after your test. Your doctor may also give you more specific instructions. If you have problems or questions, contact your doctor. Follow these instructions at home: Eating and drinking When you are wide awake, your numbness is gone and your cough and gag reflexes have come back, you may: Start eating only soft foods. Slowly drink liquids. Six hours after the test, go back to your normal diet. Driving Do not drive for 24 hours if you were given a medicine to help you relax (sedative). Do not drive or use heavy machinery while taking prescription pain medicine. General instructions Take over-the-counter and prescription medicines only as told by your doctor. Return to your normal activities as told. Ask what activities are safe for you. Do not use any products that have nicotine or tobacco in them. This includes cigarettes and e-cigarettes. If you need help quitting, ask your doctor. Keep all follow-up visits as told by your doctor. This is important. It is very important if you had a tissue sample (biopsy) taken. Get help right away if: You have shortness of breath that gets worse. You get light-headed. You feel like you are going to pass out (faint). You have chest pain. You cough up: More than a little blood. More blood than before. Summary Do not use cigarettes. Do not use e-cigarettes. Seek care in the Emergency Department right away if you have chest pain or shortness of breath. Call or MyChart Message our office for any questions or problems at [phone].  Okay to restart Plavix  on 08/22/2023.   This information is not intended to replace advice given to you by your health care provider. Make sure you discuss any questions you have with your health care provider.

## 2023-08-21 NOTE — Transfer of Care (Signed)
 Immediate Anesthesia Transfer of Care Note  Patient: [name]  Procedure(s) Performed: VIDEO BRONCHOSCOPY WITH ENDOBRONCHIAL NAVIGATION (Left) VIDEO BRONCHOSCOPY WITH RADIAL ENDOBRONCHIAL ULTRASOUND BRONCHOSCOPY, WITH EBUS  Patient Location: PACU  Anesthesia Type:General  Level of Consciousness: awake, drowsy, patient cooperative, and responds to stimulation  Airway & Oxygen Therapy: Patient Spontanous Breathing and Patient connected to face mask oxygen  Post-op Assessment: Report given to RN and Post -op Vital signs reviewed and stable  Post vital signs: Reviewed and stable  Last Vitals:  Vitals Value Taken Time  BP 116/64 08/21/23 12:09  Temp    Pulse 90 08/21/23 12:10  Resp 17 08/21/23 12:10  SpO2 98 % 08/21/23 12:10  Vitals shown include unfiled device data.  Last Pain:  Vitals:   08/21/23 0941  TempSrc:   PainSc: 0-No pain         Complications: No notable events documented.

## 2023-08-21 NOTE — Progress Notes (Signed)
 Dr. Shelah is good chest xray, may be discharged per pacu criteria.

## 2023-08-21 NOTE — Op Note (Signed)
 Video Bronchoscopy with Endobronchial Ultrasound and Electromagnetic Navigation Procedure Note  Date of Operation: 08/21/2023  Pre-op Diagnosis: Left upper lobe mass  Post-op Diagnosis: Same  Surgeon: LAMAR CHRIS  Assistants: Paula Southerly  Anesthesia: General endotracheal anesthesia  Operation: Flexible video fiberoptic bronchoscopy with endobronchial ultrasound, robotic assisted navigation and biopsies.  Estimated Blood Loss: Minimal  Complications: None apparent  Indications and History: [name] is a 73 y.o. male with history of tobacco use, CHF, MGUS, hyperlipidemia.  He has a left upper lobe mass that is hypermetabolic on PET scan.  Recommendation was made to achieve a tissue diagnosis using endobronchial ultrasound and robotic assisted navigational bronchoscopy. The risks, benefits, complications, treatment options and expected outcomes were discussed with the patient.  The possibilities of pneumothorax, pneumonia, reaction to medication, pulmonary aspiration, perforation of a viscus, bleeding, failure to diagnose a condition and creating a complication requiring transfusion or operation were discussed with the patient who freely signed the consent.    Description of Procedure: The patient was seen in the Preoperative Area, was examined and was deemed appropriate to proceed.  The patient was taken to [facility] room 3, identified as [name] and the procedure verified as Flexible Video Fiberoptic Bronchoscopy with robotic assisted navigation and endobronchial ultrasound.  A Time Out was held and the above information confirmed.   Robotic assisted navigation: Prior to the date of the procedure a high-resolution CT scan of the chest was performed. Utilizing ION software program a virtual tracheobronchial tree was generated to allow the creation of distinct navigation pathways to the patient's parenchymal abnormalities. After being taken to the operating room general  anesthesia was initiated and the patient  was orally intubated. The video fiberoptic bronchoscope was introduced via the endotracheal tube and a general inspection was performed which showed normal right and left lung anatomy.  No endobronchial lesions were seen.  Aspiration of the bilateral mainstems was completed to remove any remaining secretions. Robotic catheter inserted into patient's endotracheal tube.   Target #1 left upper lobe mass: The distinct navigation pathways prepared prior to this procedure were then utilized to navigate to patient's lesion identified on CT scan. The robotic catheter was secured into place and the vision probe was withdrawn.  Lesion location was approximated using fluoroscopy.  Radial EBUS was performed and there was a concentric view in the anterior segment of the left upper lobe.  Local registration and targeting was performed using [employer] Cios mobile C-arm three-dimensional imaging. Under fluoroscopic guidance transbronchial needle brushings, transbronchial needle biopsies, and transbronchial cryoprobe biopsies were performed to be sent for cytology and pathology.  Needle-in-lesion was confirmed using Cios mobile C-arm.     Endobronchial ultrasound: The robotic scope was then withdrawn and the endobronchial ultrasound was used to identify and characterize the peritracheal, hilar and bronchial lymph nodes. Inspection showed small nodes at station 11L, 4L, 7, 4R.  None of the nodes was greater than 1.5 cm.  No samples were taken.    At the end of the procedure a general airway inspection was performed and there was no evidence of active bleeding. The bronchoscope was removed.  The patient tolerated the procedure well. There was no significant blood loss and there were no obvious complications. A post-procedural chest x-ray is pending.  Samples Target #1: 1. Transbronchial needle brushings from left upper lobe mass 2. Transbronchial Wang needle biopsies  from left upper lobe mass 3. Transbronchial cryoprobe biopsies from left upper lobe mass  LAMAR CHRIS, MD,  PhD 08/21/2023, 12:02 PM [facility]

## 2023-08-21 NOTE — Anesthesia Procedure Notes (Signed)
 Procedure Name: Intubation Date/Time: 08/21/2023 10:49 AM  Performed by: Myrna Homer, CRNAPre-anesthesia Checklist: Patient identified, Emergency Drugs available, Suction available and Patient being monitored Patient Re-evaluated:Patient Re-evaluated prior to induction Oxygen Delivery Method: Circle System Utilized Preoxygenation: Pre-oxygenation with 100% oxygen Induction Type: IV induction Ventilation: Mask ventilation without difficulty Laryngoscope Size: Glidescope and 4 Grade View: Grade I Tube type: Oral Tube size: 8.5 mm Number of attempts: 1 Airway Equipment and Method: Stylet and Oral airway Placement Confirmation: ETT inserted through vocal cords under direct vision, positive ETCO2 and breath sounds checked- equal and bilateral Secured at: 23 cm Tube secured with: Tape Dental Injury: Teeth and Oropharynx as per pre-operative assessment

## 2023-08-22 ENCOUNTER — Encounter (HOSPITAL_COMMUNITY): Payer: Self-pay | Admitting: Emergency Medicine

## 2023-08-22 ENCOUNTER — Telehealth: Payer: Self-pay | Admitting: Pulmonary Disease

## 2023-08-22 NOTE — Telephone Encounter (Signed)
 Patient had a bronchoscopy with biopsy yesterday.  He continues to have cough which is an expected outcome of his bronchoscopy.  Patient's daughter asked about whether it would be appropriate to use Delsym (dextromethorphan) for his cough.  I recommended that the patient use medication as necessary for comfort.  I also reassured the patient and his daughter that it is expected to have cough that resolves over several days after bronchoscopy.  They were appreciative of the conversation.  -- Ria Hemming, MD [facility]

## 2023-08-22 NOTE — Anesthesia Postprocedure Evaluation (Signed)
 Anesthesia Post Note  Patient: [name]  Procedure(s) Performed: VIDEO BRONCHOSCOPY WITH ENDOBRONCHIAL NAVIGATION (Left) VIDEO BRONCHOSCOPY WITH RADIAL ENDOBRONCHIAL ULTRASOUND BRONCHOSCOPY, WITH EBUS     Patient location during evaluation: PACU Anesthesia Type: General Level of consciousness: sedated and patient cooperative Pain management: pain level controlled Vital Signs Assessment: post-procedure vital signs reviewed and stable Respiratory status: spontaneous breathing Cardiovascular status: stable Anesthetic complications: no   No notable events documented.  Last Vitals:  Vitals:   08/21/23 1230 08/21/23 1245  BP: 114/69 119/70  Pulse: 85 83  Resp: 17 16  Temp:  37.1 C  SpO2: 94% 94%    Last Pain:  Vitals:   08/21/23 1209  TempSrc:   PainSc: 0-No pain                 Norleen Pope

## 2023-08-23 ENCOUNTER — Telehealth: Payer: Self-pay | Admitting: Emergency Medicine

## 2023-08-23 MED ORDER — BENZONATATE 200 MG PO CAPS: 200.0000 mg | ORAL_CAPSULE | Freq: Three times a day (TID) | ORAL | 1 refills | Status: DC | PRN

## 2023-08-23 NOTE — Telephone Encounter (Signed)
 Called and spoke with patient, he had a bronch on 8/4 and now has a dry cough.  Has a headache in his left temple (nagging headache) that started a couple of weeks ago, no worse since the bronch.  He states he has some delsym  and that help the cough for a little while.  He is asking if there is anything else he should be taking for the cough.  Dr. Shelah, Please advise if patient needs to be taking anything other than Delsym  for his dry cough.  Thank you.

## 2023-08-23 NOTE — Telephone Encounter (Signed)
 Thank you :)

## 2023-08-23 NOTE — Telephone Encounter (Signed)
 I sent a script for tessalon  to his pharmacy.

## 2023-08-23 NOTE — Telephone Encounter (Signed)
 Answering service:   Patient had a bronchoscopy yesterday and his cough is extreme. Wants to confirm which rx she can give him.

## 2023-08-23 NOTE — Telephone Encounter (Signed)
 We can give a script for tessalon  perles q6h prn for cough. He can take this in combo w the delsym

## 2023-08-24 NOTE — Telephone Encounter (Signed)
 Called and spoke with the patient, he is aware rx has been sent to pharmacy & verbalized understanding he can take it with the delsym.  Nfn

## 2023-08-25 LAB — CYTOLOGY - NON PAP

## 2023-08-27 ENCOUNTER — Encounter (HOSPITAL_COMMUNITY): Payer: Self-pay | Admitting: Emergency Medicine

## 2023-08-27 ENCOUNTER — Encounter (HOSPITAL_COMMUNITY): Payer: Self-pay

## 2023-08-27 ENCOUNTER — Encounter: Payer: Self-pay | Admitting: Neurology

## 2023-08-27 ENCOUNTER — Emergency Department (HOSPITAL_COMMUNITY)

## 2023-08-27 ENCOUNTER — Inpatient Hospital Stay (HOSPITAL_COMMUNITY)
Admission: EM | Admit: 2023-08-27 | Discharge: 2023-09-02 | DRG: 180 | Disposition: A | Discharge status: Home Health | Source: Skilled Nursing Facility | Attending: Family Medicine | Admitting: Family Medicine

## 2023-08-27 ENCOUNTER — Emergency Department (HOSPITAL_COMMUNITY)
Admission: EM | Admit: 2023-08-27 | Discharge: 2023-08-27 | Disposition: A | Discharge status: Home / Self Care | Attending: Emergency Medicine | Admitting: Emergency Medicine

## 2023-08-27 ENCOUNTER — Other Ambulatory Visit: Payer: Self-pay

## 2023-08-27 DIAGNOSIS — Z79899 Other long term (current) drug therapy: Secondary | ICD-10-CM | POA: Insufficient documentation

## 2023-08-27 DIAGNOSIS — I5032 Chronic diastolic (congestive) heart failure: Secondary | ICD-10-CM | POA: Diagnosis present

## 2023-08-27 DIAGNOSIS — I700 Atherosclerosis of aorta: Secondary | ICD-10-CM | POA: Diagnosis present

## 2023-08-27 DIAGNOSIS — D539 Nutritional anemia, unspecified: Secondary | ICD-10-CM | POA: Diagnosis present

## 2023-08-27 DIAGNOSIS — R410 Disorientation, unspecified: Secondary | ICD-10-CM | POA: Diagnosis present

## 2023-08-27 DIAGNOSIS — Z833 Family history of diabetes mellitus: Secondary | ICD-10-CM

## 2023-08-27 DIAGNOSIS — Z85118 Personal history of other malignant neoplasm of bronchus and lung: Secondary | ICD-10-CM

## 2023-08-27 DIAGNOSIS — Z7902 Long term (current) use of antithrombotics/antiplatelets: Secondary | ICD-10-CM | POA: Insufficient documentation

## 2023-08-27 DIAGNOSIS — J189 Pneumonia, unspecified organism: Secondary | ICD-10-CM | POA: Diagnosis present

## 2023-08-27 DIAGNOSIS — C3412 Malignant neoplasm of upper lobe, left bronchus or lung: Secondary | ICD-10-CM | POA: Diagnosis not present

## 2023-08-27 DIAGNOSIS — R5381 Other malaise: Secondary | ICD-10-CM | POA: Diagnosis present

## 2023-08-27 DIAGNOSIS — K219 Gastro-esophageal reflux disease without esophagitis: Secondary | ICD-10-CM | POA: Diagnosis present

## 2023-08-27 DIAGNOSIS — Z9049 Acquired absence of other specified parts of digestive tract: Secondary | ICD-10-CM

## 2023-08-27 DIAGNOSIS — D472 Monoclonal gammopathy: Secondary | ICD-10-CM | POA: Diagnosis present

## 2023-08-27 DIAGNOSIS — R7303 Prediabetes: Secondary | ICD-10-CM | POA: Diagnosis present

## 2023-08-27 DIAGNOSIS — D62 Acute posthemorrhagic anemia: Secondary | ICD-10-CM | POA: Diagnosis not present

## 2023-08-27 DIAGNOSIS — D631 Anemia in chronic kidney disease: Secondary | ICD-10-CM | POA: Diagnosis present

## 2023-08-27 DIAGNOSIS — R042 Hemoptysis: Principal | ICD-10-CM | POA: Diagnosis present

## 2023-08-27 DIAGNOSIS — I110 Hypertensive heart disease with heart failure: Secondary | ICD-10-CM | POA: Insufficient documentation

## 2023-08-27 DIAGNOSIS — Z981 Arthrodesis status: Secondary | ICD-10-CM

## 2023-08-27 DIAGNOSIS — D630 Anemia in neoplastic disease: Secondary | ICD-10-CM | POA: Diagnosis present

## 2023-08-27 DIAGNOSIS — Z8673 Personal history of transient ischemic attack (TIA), and cerebral infarction without residual deficits: Secondary | ICD-10-CM

## 2023-08-27 DIAGNOSIS — F32A Depression, unspecified: Secondary | ICD-10-CM | POA: Diagnosis present

## 2023-08-27 DIAGNOSIS — M109 Gout, unspecified: Secondary | ICD-10-CM | POA: Diagnosis present

## 2023-08-27 DIAGNOSIS — Z888 Allergy status to other drugs, medicaments and biological substances status: Secondary | ICD-10-CM

## 2023-08-27 DIAGNOSIS — E782 Mixed hyperlipidemia: Secondary | ICD-10-CM | POA: Diagnosis present

## 2023-08-27 DIAGNOSIS — I130 Hypertensive heart and chronic kidney disease with heart failure and stage 1 through stage 4 chronic kidney disease, or unspecified chronic kidney disease: Secondary | ICD-10-CM | POA: Diagnosis present

## 2023-08-27 DIAGNOSIS — Z96651 Presence of right artificial knee joint: Secondary | ICD-10-CM | POA: Insufficient documentation

## 2023-08-27 DIAGNOSIS — N1831 Chronic kidney disease, stage 3a: Secondary | ICD-10-CM | POA: Diagnosis present

## 2023-08-27 DIAGNOSIS — I951 Orthostatic hypotension: Secondary | ICD-10-CM | POA: Diagnosis present

## 2023-08-27 DIAGNOSIS — R0682 Tachypnea, not elsewhere classified: Secondary | ICD-10-CM | POA: Diagnosis present

## 2023-08-27 DIAGNOSIS — G4700 Insomnia, unspecified: Secondary | ICD-10-CM | POA: Diagnosis present

## 2023-08-27 DIAGNOSIS — M069 Rheumatoid arthritis, unspecified: Secondary | ICD-10-CM | POA: Diagnosis present

## 2023-08-27 DIAGNOSIS — Z885 Allergy status to narcotic agent status: Secondary | ICD-10-CM

## 2023-08-27 DIAGNOSIS — Z87891 Personal history of nicotine dependence: Secondary | ICD-10-CM

## 2023-08-27 DIAGNOSIS — R918 Other nonspecific abnormal finding of lung field: Secondary | ICD-10-CM | POA: Diagnosis not present

## 2023-08-27 DIAGNOSIS — D509 Iron deficiency anemia, unspecified: Secondary | ICD-10-CM | POA: Diagnosis present

## 2023-08-27 DIAGNOSIS — Z6830 Body mass index (BMI) 30.0-30.9, adult: Secondary | ICD-10-CM

## 2023-08-27 DIAGNOSIS — Z8041 Family history of malignant neoplasm of ovary: Secondary | ICD-10-CM

## 2023-08-27 DIAGNOSIS — R195 Other fecal abnormalities: Secondary | ICD-10-CM | POA: Diagnosis present

## 2023-08-27 DIAGNOSIS — Z808 Family history of malignant neoplasm of other organs or systems: Secondary | ICD-10-CM

## 2023-08-27 DIAGNOSIS — Z8249 Family history of ischemic heart disease and other diseases of the circulatory system: Secondary | ICD-10-CM

## 2023-08-27 DIAGNOSIS — E669 Obesity, unspecified: Secondary | ICD-10-CM | POA: Diagnosis present

## 2023-08-27 DIAGNOSIS — G9389 Other specified disorders of brain: Secondary | ICD-10-CM | POA: Diagnosis present

## 2023-08-27 DIAGNOSIS — F419 Anxiety disorder, unspecified: Secondary | ICD-10-CM | POA: Diagnosis present

## 2023-08-27 LAB — CBC WITH DIFFERENTIAL/PLATELET
Abs Immature Granulocytes: 0.01 K/uL (ref 0.00–0.07)
Basophils Absolute: 0 K/uL (ref 0.0–0.1)
Basophils Relative: 0 %
Eosinophils Absolute: 0.1 K/uL (ref 0.0–0.5)
Eosinophils Relative: 2 %
HCT: 38.6 % — ABNORMAL LOW (ref 39.0–52.0)
Hemoglobin: 12.6 g/dL — ABNORMAL LOW (ref 13.0–17.0)
Immature Granulocytes: 0 %
Lymphocytes Relative: 22 %
Lymphs Abs: 1.6 K/uL (ref 0.7–4.0)
MCH: 32.3 pg (ref 26.0–34.0)
MCHC: 32.6 g/dL (ref 30.0–36.0)
MCV: 99 fL (ref 80.0–100.0)
Monocytes Absolute: 0.6 K/uL (ref 0.1–1.0)
Monocytes Relative: 9 %
Neutro Abs: 4.9 K/uL (ref 1.7–7.7)
Neutrophils Relative %: 67 %
Platelets: 300 K/uL (ref 150–400)
RBC: 3.9 MIL/uL — ABNORMAL LOW (ref 4.22–5.81)
RDW: 12.5 % (ref 11.5–15.5)
WBC: 7.2 K/uL (ref 4.0–10.5)
nRBC: 0 % (ref 0.0–0.2)

## 2023-08-27 LAB — BASIC METABOLIC PANEL WITH GFR
Anion gap: 10 (ref 5–15)
BUN: 21 mg/dL (ref 8–23)
CO2: 21 mmol/L — ABNORMAL LOW (ref 22–32)
Calcium: 8.6 mg/dL — ABNORMAL LOW (ref 8.9–10.3)
Chloride: 102 mmol/L (ref 98–111)
Creatinine, Ser: 1.53 mg/dL — ABNORMAL HIGH (ref 0.61–1.24)
GFR, Estimated: 48 mL/min — ABNORMAL LOW (ref >60)
Glucose, Bld: 105 mg/dL — ABNORMAL HIGH (ref 70–99)
Potassium: 3.7 mmol/L (ref 3.5–5.1)
Sodium: 133 mmol/L — ABNORMAL LOW (ref 135–145)

## 2023-08-27 LAB — PROTIME-INR
INR: 1 (ref 0.8–1.2)
Prothrombin Time: 14 s (ref 11.4–15.2)

## 2023-08-27 MED ORDER — TRANEXAMIC ACID FOR INHALATION
500.0000 mg | Freq: Once | RESPIRATORY_TRACT | Status: AC
  Administered 2023-08-27: 500 mg via RESPIRATORY_TRACT
  Filled 2023-08-27 (×2): qty 10

## 2023-08-27 MED ORDER — TRANEXAMIC ACID FOR INHALATION
500.0000 mg | Freq: Once | RESPIRATORY_TRACT | Status: AC
  Administered 2023-08-28 (×2): 500 mg via RESPIRATORY_TRACT
  Filled 2023-08-27 (×2): qty 10

## 2023-08-27 MED ORDER — HYDROCOD POLI-CHLORPHE POLI ER 10-8 MG/5ML PO SUER
5.0000 mL | Freq: Once | ORAL | Status: DC
  Filled 2023-08-27: qty 5

## 2023-08-27 MED ORDER — DEXTROMETHORPHAN POLISTIREX ER 30 MG/5ML PO SUER: 30.0000 mg | Freq: Two times a day (BID) | ORAL | 0 refills | Status: AC | PRN

## 2023-08-27 MED ORDER — IOHEXOL 350 MG/ML SOLN
75.0000 mL | Freq: Once | INTRAVENOUS | Status: AC | PRN
  Administered 2023-08-27: 75 mL via INTRAVENOUS

## 2023-08-27 MED ORDER — ALBUTEROL SULFATE (2.5 MG/3ML) 0.083% IN NEBU
5.0000 mg | INHALATION_SOLUTION | Freq: Once | RESPIRATORY_TRACT | Status: AC
  Administered 2023-08-27: 5 mg via RESPIRATORY_TRACT
  Filled 2023-08-27: qty 6

## 2023-08-27 MED ORDER — ALBUTEROL SULFATE HFA 108 (90 BASE) MCG/ACT IN AERS
1.0000 | INHALATION_SPRAY | Freq: Four times a day (QID) | RESPIRATORY_TRACT | Status: DC | PRN
  Filled 2023-08-27: qty 6.7

## 2023-08-27 NOTE — ED Provider Notes (Signed)
 Patient presented to the ED for evaluation of hemoptysis.  Patient was initially seen by Dr. Doretha.  Please see his note.  Patient has a left upper lung mass and had a biopsy this week.  Patient's CT scan does not show PE.  There is no signs of active extravasation.  Patient was evaluated by pulmonary critical care, Dr. Meade.  Patient is stable for discharge.  We will give him Delsym .  Patient declined the hydrocodone .  I will also give him an albuterol  inhaler to take as needed to help with his coughing.      Randol Simmonds, MD 08/27/23 [phone]

## 2023-08-27 NOTE — ED Triage Notes (Signed)
 Pt to ED via [facility] from [facility] c/o coughing up blood since this morning.  Pt was seen in ED earlier for same and discharged.  Pt denies pain or SOB.  EMS states patient has been coughing up bright red blood with clots and filled approx 300cc emesis bag total.

## 2023-08-27 NOTE — ED Provider Notes (Signed)
 [place] EMERGENCY DEPARTMENT AT [facility] Provider Note   CSN: [id number] Arrival date & time: 08/27/23  1227     Patient presents with: Hemoptysis   [name] is a 73 y.o. male.   Pt is a 73y/o male with hx of CHF, HTN, arthritis, MGUS, mass left upper lung with recent biopsy on Monday of this week, hyperlipidemia who is presenting today with hemoptysis starting this morning.  Patient reports that since having the biopsy he has had some issues with coughing and has been taking Delsym  but because of the ongoing cough which did seem a little bit better after Delsym  he saw his PCP on Thursday and was prescribed Tessalon  Perles as well as Fioricet for a headache that he had been dealing with.  He started the Tessalon  Perles yesterday but has not taken any of the Fioricet.  However this morning he woke up coughing and noticed there was blood in what he was coughing that felt like a mucus with blood in it.  However this started about 10:00 and he has had multiple episodes of coughing up blood which he feels that since he is arrived at the emergency room has gotten a little bit worse.  He does take Plavix  and took his normal dose this morning.  He does not take any aspirin  products and is on no other anticoagulants.  He denies ever having hemoptysis in the past.  He denies any chest pain or shortness of breath.  He has no abdominal pain and has not noticed any swelling in his legs.  He does not have a history of asthma or COPD and does not use inhalers regularly.  He has had occasional bronchitis per his daughter.  He has not had any fever.  The history is provided by the patient, a relative and medical records.       Prior to Admission medications   Medication Sig Start Date End Date Taking? Authorizing Provider  acetaminophen  (TYLENOL ) 500 MG tablet Take 1-2 tablets (500-1,000 mg total) by mouth every 6 (six) hours as needed for moderate pain. 11/18/21   Edmisten, Kristie L,  PA  allopurinol  (ZYLOPRIM ) 100 MG tablet Take 150 mg by mouth daily. 09/14/22   [provider]  amLODipine  (NORVASC ) 10 MG tablet Take 1 tablet (10 mg total) by mouth daily. 07/26/23   Nishan, Peter C, MD  benzonatate  (TESSALON ) 200 MG capsule Take 1 capsule (200 mg total) by mouth 3 (three) times daily as needed for cough. 08/23/23   Shelah Lamar RAMAN, MD  busPIRone  (BUSPAR ) 30 MG tablet Take 30 mg by mouth 2 (two) times daily.    [provider]  cetirizine  (ZYRTEC  ALLERGY) 10 MG tablet Take 1 tablet (10 mg total) by mouth daily. Patient taking differently: Take 10 mg by mouth at bedtime. 04/25/22   Christopher Savannah, PA-C  clopidogrel  (PLAVIX ) 75 MG tablet Take 1 tablet (75 mg total) by mouth daily. Okay to restart this medication on 08/22/2023. 08/21/23   Shelah Lamar RAMAN, MD  folic acid  (FOLVITE ) 1 MG tablet Take 1 mg by mouth daily. 11/10/21   [provider]  furosemide  (LASIX ) 40 MG tablet Take 1 tablet (40 mg total) by mouth daily. 07/13/23   Santo Stanly LABOR, MD  hydrALAZINE  (APRESOLINE ) 50 MG tablet Take 1.5 tablets (75 mg total) by mouth 2 (two) times daily. 08/16/23   Chandrasekhar, Stanly A, MD  Hydroxychloroquine  Sulfate 400 MG TABS Take 400 mg by mouth at bedtime.  [provider]  Lactobacillus (ACIDOPHILUS) CAPS capsule Take 1 capsule by mouth daily. 07/03/23   Vann, Jessica U, DO  lidocaine  (LIDODERM ) 5 % Place 1 patch onto the skin daily as needed (for pain- Remove & Discard patch within 12 hours or as directed by MD).    [provider]  PARoxetine  (PAXIL -CR) 37.5 MG 24 hr tablet Take 1 tablet (37.5 mg total) by mouth at bedtime. 08/21/23   Shelah Lamar RAMAN, MD  primidone  (MYSOLINE ) 250 MG tablet TAKE 1 TABLET BY MOUTH TWICE A DAY 07/17/23   Rosemarie Eather RAMAN, MD  QUEtiapine  (SEROQUEL ) 25 MG tablet Take 25 mg by mouth at bedtime.    [provider]  rosuvastatin  (CRESTOR ) 40 MG tablet Take 40 mg by mouth daily.    [provider]   traZODone  (DESYREL ) 100 MG tablet Take 100 mg by mouth at bedtime.    [provider]  valACYclovir  (VALTREX ) 1000 MG tablet Take 1,000 mg by mouth daily.    [provider]  Vitamin D , Ergocalciferol , (DRISDOL ) 1.25 MG (50000 UNIT) CAPS capsule Take 50,000 Units by mouth every Saturday. 09/17/20   [provider]    Allergies: Divalproex sodium, Lamotrigine, Valproic acid, Amitriptyline, Aspirin , Hydrocodone , Lisinopril, Lithium, Morphine , Nsaids, Oxycodone, Oxycodone-acetaminophen , and Pork allergy    Review of Systems  Updated Vital Signs BP 126/76 (BP Location: Left Arm)   Pulse 81   Temp 97.7 F (36.5 C) (Oral)   Resp 16   SpO2 100%   Physical Exam Vitals and nursing note reviewed.  Constitutional:      General: He is not in acute distress.    Appearance: He is well-developed.  HENT:     Head: Normocephalic and atraumatic.     Nose:     Comments: No nosebleeds Eyes:     Conjunctiva/sclera: Conjunctivae normal.     Pupils: Pupils are equal, round, and reactive to light.  Cardiovascular:     Rate and Rhythm: Normal rate and regular rhythm.     Pulses: Normal pulses.     Heart sounds: No murmur heard. Pulmonary:     Effort: Pulmonary effort is normal. No respiratory distress.     Breath sounds: Normal breath sounds. No wheezing or rales.     Comments: Fairly persistently coughing on exam and coughing up bright red frothy blood.  While I was in the room coughed up approximately 2 tablespoons Abdominal:     General: There is no distension.     Palpations: Abdomen is soft.     Tenderness: There is no abdominal tenderness. There is no guarding or rebound.  Musculoskeletal:        General: No tenderness. Normal range of motion.     Cervical back: Normal range of motion and neck supple.     Right lower leg: No edema.     Left lower leg: No edema.  Skin:    General: Skin is warm and dry.     Findings: No erythema or rash.  Neurological:      Mental Status: He is alert and oriented to person, place, and time.  Psychiatric:        Behavior: Behavior normal.     (all labs ordered are listed, but only abnormal results are displayed) Labs Reviewed  CBC WITH DIFFERENTIAL/PLATELET - Abnormal; Notable for the following components:      Result Value   RBC 3.90 (*)    Hemoglobin 12.6 (*)    HCT 38.6 (*)  All other components within normal limits  BASIC METABOLIC PANEL WITH GFR - Abnormal; Notable for the following components:   Sodium 133 (*)    CO2 21 (*)    Glucose, Bld 105 (*)    Creatinine, Ser 1.53 (*)    Calcium  8.6 (*)    GFR, Estimated 48 (*)    All other components within normal limits  PROTIME-INR    EKG: EKG Interpretation Date/Time:  Sunday August 27 2023 13:22:34 EDT Ventricular Rate:  88 PR Interval:  180 QRS Duration:  138 QT Interval:  413 QTC Calculation: 503 R Axis:   269  Text Interpretation: Sinus rhythm RBBB and LAFB No significant change since last tracing Confirmed by Doretha Folks ([id number]) on 08/27/2023 1:38:35 PM  Radiology: [facility] Chest Port 1 View Result Date: 08/27/2023 CLINICAL DATA:  Hemoptysis.  Recent bronchoscopy with biopsy. EXAM: PORTABLE CHEST 1 VIEW COMPARISON:  Radiograph 08/21/2023.  PET CT 07/26/2023 FINDINGS: Unchanged mild volume loss in the left hemithorax with left suprahilar opacity corresponding to hypermetabolic mass on PET-CT. No pneumothorax. Stable heart size and mediastinal contours. No new airspace disease. No pulmonary edema. No pleural effusion. IMPRESSION: Unchanged mild volume loss in the left hemithorax with left suprahilar opacity corresponding to hypermetabolic mass on PET-CT. No pneumothorax or new findings. Electronically Signed   By: Andrea Gasman M.D.   On: 08/27/2023 14:05     Procedures   Medications Ordered in the ED  tranexamic acid  (CYKLOKAPRON ) 1000 MG/10ML nebulizer solution 500 mg (500 mg Nebulization Given 08/27/23 1325)  albuterol  (PROVENTIL )  (2.5 MG/3ML) 0.083% nebulizer solution 5 mg (5 mg Nebulization Given 08/27/23 1451)                                    Medical Decision Making Amount and/or Complexity of Data Reviewed External Data Reviewed: notes. Labs: ordered. Decision-making details documented in ED Course. Radiology: ordered and independent interpretation performed. Decision-making details documented in ED Course. ECG/medicine tests: ordered and independent interpretation performed. Decision-making details documented in ED Course.  Risk Prescription drug management.   Pt with multiple medical problems and comorbidities and presenting today with a complaint that caries a high risk for morbidity and mortality.  Here today due to hemoptysis.  This is 5 days after having a bronchoscopy for biopsy of the lung mass.  He does take Plavix  but on no other anticoagulation.  Patient is persistently coughing in the room but sats even with coughing remained above 90%.  Without coughing sats are 100%.  Is hemodynamically stable at this time with a normal pulse.  Concern today for pulmonary hemorrhage from recent biopsy site, lower suspicion for PE, pneumonia or pneumothorax.  I independently interpreted patient's labs and EKG which shows a right bundle branch block with sinus rhythm that is unchanged from prior, CBC has not significantly changed with normal white count and hemoglobin of 12.6 from 13 few weeks ago.  BMP without acute changes.  PT is normal.  I have independently visualized and interpreted pt's images today. CXR showed no acute changes.  Radiology says unchanged mild volume loss in the left hemithorax with left suprahilar opacity corresponding with the mass.  No pneumothorax was seen. Was given nebulized TXA to see if that decreases bleeding.    After TXA patient had still coughed up some blood looks a little darker maybe a little bit of clot but is still persistently coughing and was  given albuterol  to see if that helps  with the cough.  Sats still remain normal. Spoke with pulmonary critical care who will come and evaluate the patient.  CTA ordered for further evaluation.  All this was discussed with the patient and his daughter.  Albuterol  has helped slightly with the cough.  Patient was placed on 2 L nasal cannula for some mild tachypnea.  Sats remain normal.  CRITICAL CARE Performed by: Matilyn Fehrman Total critical care time: 30 minutes Critical care time was exclusive of separately billable procedures and treating other patients. Critical care was necessary to treat or prevent imminent or life-threatening deterioration. Critical care was time spent personally by me on the following activities: development of treatment plan with patient and/or surrogate as well as nursing, discussions with consultants, evaluation of patient's response to treatment, examination of patient, obtaining history from patient or surrogate, ordering and performing treatments and interventions, ordering and review of laboratory studies, ordering and review of radiographic studies, pulse oximetry and re-evaluation of patient's condition.      Final diagnoses:  Hemoptysis    ED Discharge Orders     None          Doretha Folks, MD 08/27/23 1530

## 2023-08-27 NOTE — ED Provider Notes (Signed)
 [place] EMERGENCY DEPARTMENT AT [facility] Provider Note   CSN: [id number] Arrival date & time: 08/27/23  2332     History Chief Complaint  Patient presents with   Hemoptysis    HPI [name] is a 73 y.o. male presenting for complaint of recurrent hemoptysis.  He has a known pulmonary mass.  Seen earlier in the day, got a CTA and a pulmonology consult.  He was deemed stable for outpatient care management as the bleeding had resolved.  He got home, had a small meal and immediately had recurrent [place] abscess.  Denies fevers chills nausea vomiting syncope shortness of breath otherwise. Struggling to stop coughing due to degree of symptoms.    Patient's recorded medical, surgical, social, medication list and allergies were reviewed in the Snapshot window as part of the initial history.   Review of Systems   Review of Systems  Constitutional:  Negative for chills and fever.  HENT:  Negative for ear pain and sore throat.   Eyes:  Negative for pain and visual disturbance.  Respiratory:  Positive for cough. Negative for shortness of breath.   Cardiovascular:  Negative for chest pain and palpitations.  Gastrointestinal:  Negative for abdominal pain and vomiting.  Genitourinary:  Negative for dysuria and hematuria.  Musculoskeletal:  Negative for arthralgias and back pain.  Skin:  Negative for color change and rash.  Neurological:  Negative for seizures and syncope.  All other systems reviewed and are negative.   Physical Exam Updated Vital Signs BP 99/61 (BP Location: Right Arm)   Pulse 99   Resp (!) 24   Ht 5' 7 (1.702 m)   Wt 87.1 kg   SpO2 97%   BMI 30.07 kg/m  Physical Exam Vitals and nursing note reviewed.  Constitutional:      General: He is not in acute distress.    Appearance: He is well-developed.  HENT:     Head: Normocephalic and atraumatic.  Eyes:     Conjunctiva/sclera: Conjunctivae normal.  Cardiovascular:     Rate and Rhythm:  Normal rate and regular rhythm.     Heart sounds: No murmur heard. Pulmonary:     Effort: Pulmonary effort is normal. No respiratory distress.     Breath sounds: Rhonchi present.  Abdominal:     Palpations: Abdomen is soft.     Tenderness: There is no abdominal tenderness.  Musculoskeletal:        General: No swelling.     Cervical back: Neck supple.  Skin:    General: Skin is warm and dry.     Capillary Refill: Capillary refill takes less than 2 seconds.  Neurological:     Mental Status: He is alert.  Psychiatric:        Mood and Affect: Mood normal.      ED Course/ Medical Decision Making/ A&P    Procedures .Critical Care  Performed by: Jerral Meth, MD Authorized by: Jerral Meth, MD   Critical care provider statement:    Critical care time (minutes):  30   Critical care was necessary to treat or prevent imminent or life-threatening deterioration of the following conditions:  Respiratory failure   Critical care was time spent personally by me on the following activities:  Development of treatment plan with patient or surrogate, discussions with consultants, evaluation of patient's response to treatment, examination of patient, ordering and review of laboratory studies, ordering and review of radiographic studies, ordering and performing treatments and interventions, pulse oximetry, re-evaluation  of patient's condition and review of old charts    Medications Ordered in ED Medications  tranexamic acid  (CYKLOKAPRON ) 1000 MG/10ML nebulizer solution 500 mg (has no administration in time range)    Medical Decision Making:   Stable 74 year old male presenting with a chief complaint of hemoptysis. His history of present illness and physical exam findings are likely consistent with massive hemoptysis secondary to recurrent respiratory bleeding in the setting of known pulmonary malignancy.  Meet criteria for massive hemoptysis.  Immediately consulted pulmonary critical  care while performing a TXA nebulizer therapy. Repeating blood work for evaluation of acute pathology.  Heart rate stable, blood pressure stable at this time.  He is slightly tachypneic though satting well on room air. Pulmonology agreed with plan and stated they would come see him at bedside and likely admit to the ICU for ongoing care management. Informed patient of plan of care, he feels comfortable with current plan.  Will continue to observe until's care.  Frequently reassessed in the ER.  Clinical Impression:  1. Hemoptysis      Data Unavailable   Final Clinical Impression(s) / ED Diagnoses Final diagnoses:  Hemoptysis    Rx / DC Orders ED Discharge Orders     None         Jerral Meth, MD 08/28/23 [phone]

## 2023-08-27 NOTE — Discharge Instructions (Signed)
 Take the Delsym  to help with your cough.  You can also take the albuterol , 1 to 2 puffs every 6 hours as needed for cough.  Follow-up with the lung doctor as planned

## 2023-08-27 NOTE — Consult Note (Addendum)
 NAME:  [name], MRN:  [id number], DOB:  Oct 22, 1950, LOS: 0 ADMISSION DATE:  08/27/2023, CONSULTATION DATE:  08/27/2023 REFERRING MD:  Randol Simmonds, MD, CHIEF COMPLAINT:  hemoptysis   History of Present Illness:  [name] is a 73 year old gentleman who presents with hemoptysis.  In June of this year he presented to the hospital for orthostatic hypotension and AKI.  During this admission he was found to have a left hilar mass.  He subsequently underwent bronchoscopy with biopsies with Dr. Shelah on 08/21/2023.  He did okay the first day after the procedure and was tired.  However subsequently he has had progressively worsening cough which has been dry.  Denies any fevers chills night sweats.  His breathing has been nonlabored.  His appetite has been poor but this is unchanged from prior to bronchoscopy.  He called into the pulmonary office and was prescribed Delsym  which helped a lot.  He was also prescribed Tessalon  Perles which he feels made the cough worse.  Earlier this morning he had hemoptysis which was bright red, straight blood, and seems like a large clot.  Estimated blood amount several spoonfuls.  He presented to the ED with his daughter which is where he has been staying since the bronchoscopy.  He has received TXA nebs and albuterol  nebs.  He feels the TXA neb made him cough a lot but the albuterol  neb seems to have helped somewhat with some chest heaviness.  PCCM is consulted for evaluation for patient with hemoptysis.  Of note he is also on Plavix  which was held prior to the bronchoscopy but resumed the day after.  He lives in a assisted living and all of his medications are given through blister packs.  His last dose of Plavix  was earlier today.  Pertinent  Medical History  HDL Goup HTN Seasonal allergies HFpEF  Significant Hospital Events: Including procedures, antibiotic start and stop dates in addition to other pertinent events     Interim History / Subjective:     Objective    Blood pressure 117/66, pulse 94, temperature 97.7 F (36.5 C), temperature source Oral, resp. rate (!) 24, SpO2 98%.       No intake or output data in the 24 hours ending 08/27/23 1606 There were no vitals filed for this visit.  Examination: General: Elderly, no distress, on nasal cannula HENT: No blood in oropharynx Lungs: Clear to auscultation, no wheezes or crackles Cardiovascular: Tachycardic, regular Abdomen: Soft, nontender Extremities: No edema Neuro: Normal speech, no focal asymmetry  Resolved problem list   Assessment and Plan   Hemoptysis Left hilar mass, new diagnosis adenocarcinoma lung Possibilities for his hemoptysis include bleeding from his mass since he had cryobiopsies likely in the setting of extensive coughing.  Less likely that he has pneumonia given the absence of other signs.  Discussed with Dr. Shelah.  Currently minimal bronchoscopic interventions are available for this.  Ideally would manage his conservatively with cough suppression, holding anticoagulation, TXA nebs as needed.  Recommend CT PE study to rule out PE and also to identify any chest abnormalities as well as any visible extravasation which would be amenable to IR embolization therapy.  Could consider bronchoscopy with APC as a last resort. He has a reaction listed for nausea related to hydrocodone  cough syrup.  I recommend giving a try while here in the hospital to see if he can tolerate it as this will be the most potent cough suppression I can give him at home.  Patient actually wants to go home and he has a follow-up with her office tomorrow.  If we are able to improve his hemoptysis and the CT scan is nonconcerning this could be a possibility. Recommend holding plavix  x 1 week.    Verdon Gore, MD Pulmonary and Critical Care Medicine [facility] 08/27/2023 4:35 PM Pager: see AMION  If no response to pager, please call critical care on call (see AMION) until  7pm After 7:00 pm call Elink       Labs   CBC: Recent Labs  Lab 08/21/23 0930 08/27/23 1316  WBC 7.9 7.2  NEUTROABS  --  4.9  HGB 13.2 12.6*  HCT 39.0 38.6*  MCV 97.7 99.0  PLT 297 300    Basic Metabolic Panel: Recent Labs  Lab 08/27/23 1316  NA 133*  K 3.7  CL 102  CO2 21*  GLUCOSE 105*  BUN 21  CREATININE 1.53*  CALCIUM  8.6*   GFR: Estimated Creatinine Clearance: 47 mL/min (A) (by C-G formula based on SCr of 1.53 mg/dL (H)). Recent Labs  Lab 08/21/23 0930 08/27/23 1316  WBC 7.9 7.2    Liver Function Tests: No results for input(s): AST, ALT, ALKPHOS, BILITOT, PROT, ALBUMIN in the last 168 hours. No results for input(s): LIPASE, AMYLASE in the last 168 hours. No results for input(s): AMMONIA in the last 168 hours.  ABG    Component Value Date/Time   TCO2 24 11/27/2016 2004     Coagulation Profile: Recent Labs  Lab 08/27/23 1316  INR 1.0    Cardiac Enzymes: No results for input(s): CKTOTAL, CKMB, CKMBINDEX, TROPONINI in the last 168 hours.  HbA1C: Hgb A1c MFr Bld  Date/Time Value Ref Range Status  07/01/2023 05:40 AM 5.2 4.8 - 5.6 % Final    Comment:    (NOTE) Diagnosis of Diabetes The following HbA1c ranges recommended by the American Diabetes Association (ADA) may be used as an aid in the diagnosis of diabetes mellitus.  Hemoglobin             Suggested A1C NGSP%              Diagnosis  <5.7                   Non Diabetic  5.7-6.4                Pre-Diabetic  >6.4                   Diabetic  <7.0                   Glycemic control for                       adults with diabetes.    06/04/2020 01:20 PM 5.9 (H) 4.8 - 5.6 % Final    Comment:             Prediabetes: 5.7 - 6.4          Diabetes: >6.4          Glycemic control for adults with diabetes: <7.0     CBG: No results for input(s): GLUCAP in the last 168 hours.  Review of Systems:     Past Medical History:  He,  has a past medical  history of (HFpEF) heart failure with preserved ejection fraction (HCC), Allergy, Anxiety, Arthritis, Chronic kidney disease, Depression, Frequent urination, GERD (gastroesophageal reflux disease), Gout, Headache, Herpes, History of  nuclear stress test, Hyperlipidemia, Hypertension, IBS (irritable bowel syndrome), Memory loss, MGUS (monoclonal gammopathy of unknown significance), Obesity, Pre-diabetes, Stroke (HCC), and Urgency of urination.   Surgical History:   Past Surgical History:  Procedure Laterality Date   CHOLECYSTECTOMY N/A 12/17/2019   Procedure: LAPAROSCOPIC CHOLECYSTECTOMY WITH ICG INJECTION;  Surgeon: Ebbie Cough, MD;  Location: [facility] OR;  Service: General;  Laterality: N/A;   disckec     shouler arthroscopy Left    SPINAL FUSION     TONSILLECTOMY     TOTAL KNEE ARTHROPLASTY Right 11/15/2021   Procedure: TOTAL KNEE ARTHROPLASTY;  Surgeon: Melodi Lerner, MD;  Location: [facility] ORS;  Service: Orthopedics;  Laterality: Right;   VIDEO BRONCHOSCOPY WITH ENDOBRONCHIAL NAVIGATION Left 08/21/2023   Procedure: VIDEO BRONCHOSCOPY WITH ENDOBRONCHIAL NAVIGATION;  Surgeon: Shelah Lamar RAMAN, MD;  Location: [facility] ENDOSCOPY;  Service: Pulmonary;  Laterality: Left;   VIDEO BRONCHOSCOPY WITH ENDOBRONCHIAL ULTRASOUND  08/21/2023   Procedure: BRONCHOSCOPY, WITH EBUS;  Surgeon: Shelah Lamar RAMAN, MD;  Location[facility] ENDOSCOPY;  Service: Pulmonary;;   VIDEO BRONCHOSCOPY WITH RADIAL ENDOBRONCHIAL ULTRASOUND  08/21/2023   Procedure: VIDEO BRONCHOSCOPY WITH RADIAL ENDOBRONCHIAL ULTRASOUND;  Surgeon: Shelah Lamar RAMAN, MD;  Location: [facility] ENDOSCOPY;  Service: Pulmonary;;   WISDOM TOOTH EXTRACTION       Social History:   reports that he quit smoking about 35 years ago. His smoking use included cigarettes. He has never used smokeless tobacco. He reports that he does not drink alcohol  and does not use drugs.   Family History:  His family history includes Cancer in his maternal aunt and maternal aunt; Cancer - Ovarian in  his maternal grandmother; Diabetes Mellitus I in his paternal grandmother; Hypertension in his brother, father, mother, paternal grandfather, and sister.   Allergies Allergies  Allergen Reactions   Divalproex Sodium Hives   Lamotrigine Diarrhea and Other (See Comments)    Shakes and unsteadiness     Valproic Acid Hives and Other (See Comments)    Shakes GI problems, too   Amitriptyline Other (See Comments)    Reaction not known   Aspirin  Nausea And Vomiting and Other (See Comments)    Dizziness, weakness and balance issues, and sweats too   Hydrocodone  Nausea And Vomiting   Lisinopril Cough         Lithium Diarrhea and Other (See Comments)    Falls over and shakes    Morphine  Other (See Comments)     'not tolerated well   Nsaids Other (See Comments)    Kidney disease   Oxycodone Nausea And Vomiting   Oxycodone-Acetaminophen  Nausea And Vomiting and Other (See Comments)    Also causes dizziness   Pork Allergy Nausea Only     Home Medications  Prior to Admission medications   Medication Sig Start Date End Date Taking? Authorizing Provider  acetaminophen  (TYLENOL ) 500 MG tablet Take 1-2 tablets (500-1,000 mg total) by mouth every 6 (six) hours as needed for moderate pain. 11/18/21   Edmisten, Kristie L, PA  allopurinol  (ZYLOPRIM ) 100 MG tablet Take 150 mg by mouth daily. 09/14/22   [provider]  amLODipine  (NORVASC ) 10 MG tablet Take 1 tablet (10 mg total) by mouth daily. 07/26/23   Nishan, Peter C, MD  benzonatate  (TESSALON ) 200 MG capsule Take 1 capsule (200 mg total) by mouth 3 (three) times daily as needed for cough. 08/23/23   Shelah Lamar RAMAN, MD  busPIRone  (BUSPAR ) 30 MG tablet Take 30 mg by mouth 2 (two) times daily.    [provider]  cetirizine  (ZYRTEC  ALLERGY) 10 MG tablet Take 1 tablet (10 mg total) by mouth daily. Patient taking differently: Take 10 mg by mouth at bedtime. 04/25/22   Christopher Savannah, PA-C  clopidogrel  (PLAVIX ) 75 MG tablet Take 1 tablet  (75 mg total) by mouth daily. Okay to restart this medication on 08/22/2023. 08/21/23   Shelah Lamar RAMAN, MD  folic acid  (FOLVITE ) 1 MG tablet Take 1 mg by mouth daily. 11/10/21   [provider]  furosemide  (LASIX ) 40 MG tablet Take 1 tablet (40 mg total) by mouth daily. 07/13/23   Santo Stanly LABOR, MD  hydrALAZINE  (APRESOLINE ) 50 MG tablet Take 1.5 tablets (75 mg total) by mouth 2 (two) times daily. 08/16/23   Chandrasekhar, Stanly A, MD  Hydroxychloroquine  Sulfate 400 MG TABS Take 400 mg by mouth at bedtime.    [provider]  Lactobacillus (ACIDOPHILUS) CAPS capsule Take 1 capsule by mouth daily. 07/03/23   Vann, Jessica U, DO  lidocaine  (LIDODERM ) 5 % Place 1 patch onto the skin daily as needed (for pain- Remove & Discard patch within 12 hours or as directed by MD).    [provider]  PARoxetine  (PAXIL -CR) 37.5 MG 24 hr tablet Take 1 tablet (37.5 mg total) by mouth at bedtime. 08/21/23   Shelah Lamar RAMAN, MD  primidone  (MYSOLINE ) 250 MG tablet TAKE 1 TABLET BY MOUTH TWICE A DAY 07/17/23   Rosemarie Eather RAMAN, MD  QUEtiapine  (SEROQUEL ) 25 MG tablet Take 25 mg by mouth at bedtime.    [provider]  rosuvastatin  (CRESTOR ) 40 MG tablet Take 40 mg by mouth daily.    [provider]  traZODone  (DESYREL ) 100 MG tablet Take 100 mg by mouth at bedtime.    [provider]  valACYclovir  (VALTREX ) 1000 MG tablet Take 1,000 mg by mouth daily.    [provider]  Vitamin D , Ergocalciferol , (DRISDOL ) 1.25 MG (50000 UNIT) CAPS capsule Take 50,000 Units by mouth every Saturday. 09/17/20   [provider]

## 2023-08-27 NOTE — ED Triage Notes (Signed)
 Pt BIB EMS from [facility] for coughing up blood x1 day. Pt stated he had imaging on Monday and there was a lump on his lung. Pt is waiting on the biopsy results. Pt feels lightheaded as well.

## 2023-08-28 ENCOUNTER — Ambulatory Visit: Admitting: Acute Care

## 2023-08-28 ENCOUNTER — Emergency Department (HOSPITAL_COMMUNITY)

## 2023-08-28 ENCOUNTER — Inpatient Hospital Stay (HOSPITAL_COMMUNITY)

## 2023-08-28 ENCOUNTER — Other Ambulatory Visit (HOSPITAL_COMMUNITY)

## 2023-08-28 DIAGNOSIS — C3490 Malignant neoplasm of unspecified part of unspecified bronchus or lung: Secondary | ICD-10-CM | POA: Diagnosis not present

## 2023-08-28 DIAGNOSIS — N1831 Chronic kidney disease, stage 3a: Secondary | ICD-10-CM

## 2023-08-28 DIAGNOSIS — I700 Atherosclerosis of aorta: Secondary | ICD-10-CM | POA: Diagnosis present

## 2023-08-28 DIAGNOSIS — N179 Acute kidney failure, unspecified: Secondary | ICD-10-CM

## 2023-08-28 DIAGNOSIS — M109 Gout, unspecified: Secondary | ICD-10-CM | POA: Diagnosis present

## 2023-08-28 DIAGNOSIS — R042 Hemoptysis: Principal | ICD-10-CM | POA: Diagnosis present

## 2023-08-28 DIAGNOSIS — I10 Essential (primary) hypertension: Secondary | ICD-10-CM | POA: Diagnosis not present

## 2023-08-28 DIAGNOSIS — R7303 Prediabetes: Secondary | ICD-10-CM | POA: Diagnosis present

## 2023-08-28 DIAGNOSIS — E669 Obesity, unspecified: Secondary | ICD-10-CM | POA: Diagnosis present

## 2023-08-28 DIAGNOSIS — D539 Nutritional anemia, unspecified: Secondary | ICD-10-CM | POA: Diagnosis not present

## 2023-08-28 DIAGNOSIS — M069 Rheumatoid arthritis, unspecified: Secondary | ICD-10-CM | POA: Diagnosis present

## 2023-08-28 DIAGNOSIS — D649 Anemia, unspecified: Secondary | ICD-10-CM | POA: Diagnosis not present

## 2023-08-28 DIAGNOSIS — J189 Pneumonia, unspecified organism: Secondary | ICD-10-CM | POA: Diagnosis present

## 2023-08-28 DIAGNOSIS — D472 Monoclonal gammopathy: Secondary | ICD-10-CM | POA: Diagnosis present

## 2023-08-28 DIAGNOSIS — F32A Depression, unspecified: Secondary | ICD-10-CM | POA: Diagnosis present

## 2023-08-28 DIAGNOSIS — G9389 Other specified disorders of brain: Secondary | ICD-10-CM | POA: Diagnosis present

## 2023-08-28 DIAGNOSIS — E785 Hyperlipidemia, unspecified: Secondary | ICD-10-CM

## 2023-08-28 DIAGNOSIS — K219 Gastro-esophageal reflux disease without esophagitis: Secondary | ICD-10-CM | POA: Diagnosis present

## 2023-08-28 DIAGNOSIS — D500 Iron deficiency anemia secondary to blood loss (chronic): Secondary | ICD-10-CM | POA: Diagnosis not present

## 2023-08-28 DIAGNOSIS — D509 Iron deficiency anemia, unspecified: Secondary | ICD-10-CM | POA: Diagnosis present

## 2023-08-28 DIAGNOSIS — D62 Acute posthemorrhagic anemia: Secondary | ICD-10-CM | POA: Diagnosis not present

## 2023-08-28 DIAGNOSIS — D630 Anemia in neoplastic disease: Secondary | ICD-10-CM | POA: Diagnosis present

## 2023-08-28 DIAGNOSIS — I130 Hypertensive heart and chronic kidney disease with heart failure and stage 1 through stage 4 chronic kidney disease, or unspecified chronic kidney disease: Secondary | ICD-10-CM | POA: Diagnosis present

## 2023-08-28 DIAGNOSIS — I951 Orthostatic hypotension: Secondary | ICD-10-CM | POA: Diagnosis present

## 2023-08-28 DIAGNOSIS — F419 Anxiety disorder, unspecified: Secondary | ICD-10-CM | POA: Diagnosis present

## 2023-08-28 DIAGNOSIS — C3412 Malignant neoplasm of upper lobe, left bronchus or lung: Secondary | ICD-10-CM

## 2023-08-28 DIAGNOSIS — I5032 Chronic diastolic (congestive) heart failure: Secondary | ICD-10-CM | POA: Diagnosis present

## 2023-08-28 DIAGNOSIS — G4700 Insomnia, unspecified: Secondary | ICD-10-CM | POA: Diagnosis present

## 2023-08-28 DIAGNOSIS — E782 Mixed hyperlipidemia: Secondary | ICD-10-CM | POA: Diagnosis present

## 2023-08-28 DIAGNOSIS — Z8673 Personal history of transient ischemic attack (TIA), and cerebral infarction without residual deficits: Secondary | ICD-10-CM | POA: Diagnosis not present

## 2023-08-28 DIAGNOSIS — D631 Anemia in chronic kidney disease: Secondary | ICD-10-CM | POA: Diagnosis present

## 2023-08-28 LAB — CBC
HCT: 31.2 % — ABNORMAL LOW (ref 39.0–52.0)
HCT: 32.9 % — ABNORMAL LOW (ref 39.0–52.0)
Hemoglobin: 10 g/dL — ABNORMAL LOW (ref 13.0–17.0)
Hemoglobin: 10.9 g/dL — ABNORMAL LOW (ref 13.0–17.0)
MCH: 32.5 pg (ref 26.0–34.0)
MCH: 32.6 pg (ref 26.0–34.0)
MCHC: 32.1 g/dL (ref 30.0–36.0)
MCHC: 33.1 g/dL (ref 30.0–36.0)
MCV: 101.3 fL — ABNORMAL HIGH (ref 80.0–100.0)
MCV: 98.5 fL (ref 80.0–100.0)
Platelets: 270 K/uL (ref 150–400)
Platelets: 309 K/uL (ref 150–400)
RBC: 3.08 MIL/uL — ABNORMAL LOW (ref 4.22–5.81)
RBC: 3.34 MIL/uL — ABNORMAL LOW (ref 4.22–5.81)
RDW: 12.7 % (ref 11.5–15.5)
RDW: 12.8 % (ref 11.5–15.5)
WBC: 7.2 K/uL (ref 4.0–10.5)
WBC: 8.2 K/uL (ref 4.0–10.5)
nRBC: 0 % (ref 0.0–0.2)
nRBC: 0 % (ref 0.0–0.2)

## 2023-08-28 LAB — MAGNESIUM: Magnesium: 2.2 mg/dL (ref 1.7–2.4)

## 2023-08-28 LAB — COMPREHENSIVE METABOLIC PANEL WITH GFR
ALT: 17 U/L (ref 0–44)
AST: 22 U/L (ref 15–41)
Albumin: 4 g/dL (ref 3.5–5.0)
Alkaline Phosphatase: 51 U/L (ref 38–126)
Anion gap: 12 (ref 5–15)
BUN: 40 mg/dL — ABNORMAL HIGH (ref 8–23)
CO2: 18 mmol/L — ABNORMAL LOW (ref 22–32)
Calcium: 8.5 mg/dL — ABNORMAL LOW (ref 8.9–10.3)
Chloride: 108 mmol/L (ref 98–111)
Creatinine, Ser: 1.65 mg/dL — ABNORMAL HIGH (ref 0.61–1.24)
GFR, Estimated: 44 mL/min — ABNORMAL LOW (ref >60)
Glucose, Bld: 103 mg/dL — ABNORMAL HIGH (ref 70–99)
Potassium: 3.5 mmol/L (ref 3.5–5.1)
Sodium: 138 mmol/L (ref 135–145)
Total Bilirubin: 0.5 mg/dL (ref 0.0–1.2)
Total Protein: 7.2 g/dL (ref 6.5–8.1)

## 2023-08-28 LAB — BASIC METABOLIC PANEL WITH GFR
Anion gap: 11 (ref 5–15)
BUN: 52 mg/dL — ABNORMAL HIGH (ref 8–23)
CO2: 20 mmol/L — ABNORMAL LOW (ref 22–32)
Calcium: 8.4 mg/dL — ABNORMAL LOW (ref 8.9–10.3)
Chloride: 109 mmol/L (ref 98–111)
Creatinine, Ser: 1.49 mg/dL — ABNORMAL HIGH (ref 0.61–1.24)
GFR, Estimated: 50 mL/min — ABNORMAL LOW (ref >60)
Glucose, Bld: 95 mg/dL (ref 70–99)
Potassium: 4 mmol/L (ref 3.5–5.1)
Sodium: 140 mmol/L (ref 135–145)

## 2023-08-28 LAB — PROTIME-INR
INR: 1.1 (ref 0.8–1.2)
Prothrombin Time: 15.3 s — ABNORMAL HIGH (ref 11.4–15.2)

## 2023-08-28 LAB — TYPE AND SCREEN
ABO/RH(D): A POS
Antibody Screen: NEGATIVE

## 2023-08-28 LAB — MRSA NEXT GEN BY PCR, NASAL: MRSA by PCR Next Gen: NOT DETECTED

## 2023-08-28 LAB — PHOSPHORUS: Phosphorus: 3.4 mg/dL (ref 2.5–4.6)

## 2023-08-28 MED ORDER — BENZONATATE 100 MG PO CAPS
200.0000 mg | ORAL_CAPSULE | ORAL | Status: DC | PRN
  Administered 2023-08-28 (×2): 200 mg via ORAL
  Filled 2023-08-28 (×2): qty 2

## 2023-08-28 MED ORDER — GUAIFENESIN-CODEINE 100-10 MG/5ML PO SOLN
5.0000 mL | ORAL | Status: DC | PRN
  Administered 2023-08-28 (×2): 5 mL via ORAL
  Filled 2023-08-28: qty 5

## 2023-08-28 MED ORDER — FOLIC ACID 1 MG PO TABS
1.0000 mg | ORAL_TABLET | Freq: Every day | ORAL | Status: DC
  Administered 2023-08-28 – 2023-09-02 (×9): 1 mg via ORAL
  Filled 2023-08-28 (×6): qty 1

## 2023-08-28 MED ORDER — AMLODIPINE BESYLATE 10 MG PO TABS: 10.0000 mg | ORAL_TABLET | Freq: Every day | ORAL | Status: DC

## 2023-08-28 MED ORDER — PANTOPRAZOLE SODIUM 40 MG IV SOLR
40.0000 mg | Freq: Two times a day (BID) | INTRAVENOUS | Status: DC
  Administered 2023-08-28 – 2023-09-02 (×17): 40 mg via INTRAVENOUS
  Filled 2023-08-28 (×11): qty 10

## 2023-08-28 MED ORDER — TRANEXAMIC ACID FOR INHALATION
500.0000 mg | Freq: Three times a day (TID) | RESPIRATORY_TRACT | Status: AC
  Administered 2023-08-28 – 2023-08-30 (×12): 500 mg via RESPIRATORY_TRACT
  Filled 2023-08-28 (×6): qty 10

## 2023-08-28 MED ORDER — BUSPIRONE HCL 10 MG PO TABS
30.0000 mg | ORAL_TABLET | Freq: Two times a day (BID) | ORAL | Status: DC
  Administered 2023-08-28 – 2023-09-02 (×17): 30 mg via ORAL
  Filled 2023-08-28 (×11): qty 3

## 2023-08-28 MED ORDER — ALBUTEROL SULFATE (2.5 MG/3ML) 0.083% IN NEBU
2.5000 mg | INHALATION_SOLUTION | RESPIRATORY_TRACT | Status: DC | PRN
  Administered 2023-08-28 – 2023-08-29 (×6): 2.5 mg via RESPIRATORY_TRACT
  Filled 2023-08-28 (×3): qty 3

## 2023-08-28 MED ORDER — SODIUM CHLORIDE 0.9 % IV SOLN
2.0000 g | INTRAVENOUS | Status: AC
  Administered 2023-08-28 – 2023-09-01 (×8): 2 g via INTRAVENOUS
  Filled 2023-08-28 (×5): qty 20

## 2023-08-28 MED ORDER — HYDRALAZINE HCL 50 MG PO TABS: 75.0000 mg | ORAL_TABLET | Freq: Two times a day (BID) | ORAL | Status: DC

## 2023-08-28 MED ORDER — LORAZEPAM 2 MG/ML PO CONC
0.5000 mg | ORAL | Status: AC
  Administered 2023-08-28 (×2): 0.5 mg via ORAL
  Filled 2023-08-28: qty 0.25

## 2023-08-28 MED ORDER — AMLODIPINE BESYLATE 10 MG PO TABS
10.0000 mg | ORAL_TABLET | Freq: Every day | ORAL | Status: DC
  Administered 2023-08-28 – 2023-09-02 (×9): 10 mg via ORAL
  Filled 2023-08-28 (×6): qty 1

## 2023-08-28 MED ORDER — SACCHAROMYCES BOULARDII 250 MG PO CAPS
250.0000 mg | ORAL_CAPSULE | Freq: Two times a day (BID) | ORAL | Status: DC
  Administered 2023-08-28 – 2023-09-02 (×17): 250 mg via ORAL
  Filled 2023-08-28 (×11): qty 1

## 2023-08-28 MED ORDER — DEXTROMETHORPHAN POLISTIREX ER 30 MG/5ML PO SUER
30.0000 mg | Freq: Two times a day (BID) | ORAL | Status: DC
  Administered 2023-08-28 – 2023-09-02 (×17): 30 mg via ORAL
  Filled 2023-08-28 (×11): qty 5

## 2023-08-28 MED ORDER — ORAL CARE MOUTH RINSE: 15.0000 mL | OROMUCOSAL | Status: DC | PRN

## 2023-08-28 MED ORDER — HYDROXYCHLOROQUINE SULFATE 200 MG PO TABS
400.0000 mg | ORAL_TABLET | Freq: Every day | ORAL | Status: DC
  Administered 2023-08-28 – 2023-09-01 (×8): 400 mg via ORAL
  Filled 2023-08-28 (×5): qty 2

## 2023-08-28 MED ORDER — QUETIAPINE FUMARATE 50 MG PO TABS
25.0000 mg | ORAL_TABLET | Freq: Every day | ORAL | Status: DC
Start: 2023-08-28 — End: 2023-09-02
  Administered 2023-08-28 – 2023-09-01 (×8): 25 mg via ORAL
  Filled 2023-08-28 (×5): qty 1

## 2023-08-28 MED ORDER — POLYETHYLENE GLYCOL 3350 17 G PO PACK: 17.0000 g | PACK | Freq: Every day | ORAL | Status: DC | PRN

## 2023-08-28 MED ORDER — DOCUSATE SODIUM 100 MG PO CAPS: 100.0000 mg | ORAL_CAPSULE | Freq: Two times a day (BID) | ORAL | Status: DC | PRN

## 2023-08-28 MED ORDER — LACTATED RINGERS IV SOLN: INTRAVENOUS | Status: AC

## 2023-08-28 MED ORDER — SODIUM CHLORIDE 0.9 % IV SOLN
500.0000 mg | INTRAVENOUS | Status: AC
  Administered 2023-08-28 – 2023-09-01 (×8): 500 mg via INTRAVENOUS
  Filled 2023-08-28 (×5): qty 5

## 2023-08-28 MED ORDER — TRAZODONE HCL 50 MG PO TABS
100.0000 mg | ORAL_TABLET | Freq: Every day | ORAL | Status: DC
  Administered 2023-08-28 – 2023-09-01 (×8): 100 mg via ORAL
  Filled 2023-08-28 (×5): qty 2

## 2023-08-28 MED ORDER — ROSUVASTATIN CALCIUM 20 MG PO TABS
40.0000 mg | ORAL_TABLET | Freq: Every day | ORAL | Status: DC
Start: 2023-08-28 — End: 2023-09-02
  Administered 2023-08-28 – 2023-09-01 (×8): 40 mg via ORAL
  Filled 2023-08-28 (×5): qty 2

## 2023-08-28 MED ORDER — CHLORHEXIDINE GLUCONATE CLOTH 2 % EX PADS
6.0000 | MEDICATED_PAD | Freq: Every day | CUTANEOUS | Status: DC
  Administered 2023-08-29 – 2023-09-01 (×6): 6 via TOPICAL

## 2023-08-28 MED ORDER — GUAIFENESIN 100 MG/5ML PO LIQD
10.0000 mL | ORAL | Status: DC | PRN
  Administered 2023-08-28 – 2023-08-30 (×12): 10 mL via ORAL
  Filled 2023-08-28 (×6): qty 10

## 2023-08-28 MED ORDER — GADOBUTROL 1 MMOL/ML IV SOLN
8.0000 mL | Freq: Once | INTRAVENOUS | Status: AC | PRN
  Administered 2023-08-28 (×2): 8 mL via INTRAVENOUS

## 2023-08-28 MED ORDER — BENZONATATE 100 MG PO CAPS: 100.0000 mg | ORAL_CAPSULE | ORAL | Status: DC | PRN

## 2023-08-28 MED ORDER — ALLOPURINOL 100 MG PO TABS
150.0000 mg | ORAL_TABLET | Freq: Every day | ORAL | Status: DC
  Administered 2023-08-28 – 2023-09-02 (×9): 150 mg via ORAL
  Filled 2023-08-28 (×6): qty 2

## 2023-08-28 MED ORDER — PAROXETINE HCL ER 12.5 MG PO TB24
37.5000 mg | ORAL_TABLET | Freq: Every day | ORAL | Status: DC
  Administered 2023-08-28 – 2023-09-02 (×9): 37.5 mg via ORAL
  Filled 2023-08-28 (×6): qty 3

## 2023-08-28 MED ORDER — FUROSEMIDE 40 MG PO TABS
40.0000 mg | ORAL_TABLET | Freq: Every day | ORAL | Status: DC
  Administered 2023-08-28 – 2023-09-02 (×9): 40 mg via ORAL
  Filled 2023-08-28 (×6): qty 1

## 2023-08-28 MED ORDER — MIDAZOLAM HCL 2 MG/2ML IJ SOLN: 1.0000 mg | Freq: Once | INTRAMUSCULAR | Status: DC | PRN

## 2023-08-28 NOTE — Plan of Care (Signed)
  Problem: Education: Goal: Knowledge of General Education information will improve Description: Including pain rating scale, medication(s)/side effects and non-pharmacologic comfort measures Outcome: Progressing   Problem: Health Behavior/Discharge Planning: Goal: Ability to manage health-related needs will improve Outcome: Progressing   Problem: Clinical Measurements: Goal: Will remain free from infection Outcome: Progressing Goal: Respiratory complications will improve Outcome: Progressing   Problem: Coping: Goal: Level of anxiety will decrease Outcome: Progressing   Problem: Pain Managment: Goal: General experience of comfort will improve and/or be controlled Outcome: Progressing

## 2023-08-28 NOTE — ED Notes (Signed)
 Pt placed on 4 L [place]

## 2023-08-28 NOTE — Plan of Care (Signed)
  Problem: Activity: Goal: Risk for activity intolerance will decrease Outcome: Not Progressing   Problem: Nutrition: Goal: Adequate nutrition will be maintained Outcome: Not Progressing   Problem: Coping: Goal: Level of anxiety will decrease Outcome: Not Progressing   Problem: Pain Managment: Goal: General experience of comfort will improve and/or be controlled Outcome: Not Progressing

## 2023-08-28 NOTE — Progress Notes (Signed)
 eLink Physician-Brief Progress Note Patient Name: [name] DOB: [birth year] MRN: [id number]   Date of Service  08/28/2023  HPI/Events of Note  86/M with MGUS, CHF, recent diagnosis of NSCLC, presenting with hemoptysis. CT scan was negative for PE, showed LUL mass consistent with bronchogenic carcinoma.  Pt given antitussives, TXA nebs PRN, and admitted to the ICU.    eICU Interventions  Camera assessment performed.  Pt sleeping, comfortable, not in distress RR 18-22, SpO2 97% on nasal cannula.  Will continue to monitor respiratory status closely, suction  secretions as needed.  Hold antiplatelets/anticoagulation         Rejeana Fadness M DELA CRUZ 08/28/2023, 3:28 AM

## 2023-08-28 NOTE — Progress Notes (Addendum)
 NAME:  [name], MRN:  [id number], DOB:  July 09, 1950, LOS: 0 ADMISSION DATE:  08/27/2023, CONSULTATION DATE:  08/28/23  REFERRING MD:  ER , CHIEF COMPLAINT:  Hemoptysis   History of Present Illness:   73yoM patient with MGUS, CHF, recent LUL navigational bronch with biopsy on 08/21/13 positive for NSCLC / adeno, TTF-1+ and p40 negative, here for recurrent hemoptysis, tried delsym  at home. He was in the ER earlier today and seen by Pulmonary, patient stabilized and was discharged home. At home he was eating dinner and had another episode of hemoptysis thus came back to the ER. Patient seen in ER room 10, we reviewed the bronch results and I advised him and his daughter that we will need an Oncology evaluation in regards to prognostics while he is admitted.   Pertinent  Medical History   Past Medical History:  Diagnosis Date   (HFpEF) heart failure with preserved ejection fraction (HCC)    Allergy    Anxiety    Arthritis    Chronic kidney disease    Depression    Frequent urination    GERD (gastroesophageal reflux disease)    Gout    Headache    mirgaines in the past   Herpes    History of nuclear stress test    Myoview  05/2021: EF 77, normal perfusion, low risk   Hyperlipidemia    Hypertension    IBS (irritable bowel syndrome)    Memory loss    MGUS (monoclonal gammopathy of unknown significance)    Obesity    Pre-diabetes    Stroke (HCC)    Urgency of urination      Significant Hospital Events: Including procedures, antibiotic start and stop dates in addition to other pertinent events   08/28/23 - admit to ICU for hemoptysis  Interim History / Subjective:  A little hemoptysis this morning   Objective    Blood pressure 104/74, pulse 96, temperature 99.1 F (37.3 C), temperature source Oral, resp. rate 18, height 5' 7 (1.702 m), weight 87 kg, SpO2 97%.        Intake/Output Summary (Last 24 hours) at 08/28/2023 1024 Last data filed at 08/28/2023 0645 Gross per 24  hour  Intake 322.49 ml  Output 340 ml  Net -17.51 ml   Filed Weights   08/27/23 2344 08/28/23 0245  Weight: 87.1 kg 87 kg    Examination: General: AAOx3  HENT: NCAT  Lungs: even unlabored on RA  Cardiovascular: rr cap refill brisk  Abdomen: soft  Extremities: no obvious joint deformity  GU defer  Skin c/d/w    Resolved problem list   Patient Active Problem List   Diagnosis Date Noted   Hemoptysis 08/28/2023   Mass of upper lobe of left lung 08/10/2023   MGUS (monoclonal gammopathy of unknown significance) 11/29/2022   OA (osteoarthritis) of knee 11/15/2021   Primary osteoarthritis of right knee 11/15/2021   Mixed hyperlipidemia 09/24/2021   AKI (acute kidney injury) (HCC) 04/09/2021   Recurrent major depressive disorder, in full remission (HCC) 01/07/2020   Chronic heart failure with preserved ejection fraction (HCC) 01/02/2020   Essential hypertension 01/02/2020   Right upper quadrant abdominal pain 12/16/2019   Precordial chest pain 12/15/2019   Elevated transaminase level 12/15/2019   MDD (major depressive disorder), recurrent, in partial remission (HCC) 02/07/2019   Severe recurrent major depression without psychotic features (HCC) 02/02/2019   Tardive dyskinesia 12/03/2018   Pure hypercholesterolemia 06/14/2017   History of stroke 11/11/2015   Memory  loss 10/16/2013   Tremor 04/04/2013     Assessment and Plan    LUL NSCLC - new dx   Hemoptysis, mild  - biopsy positive for NSCLC/adeno, TTF-1+ , p40 negative. -had a dry cough following bronch/cryobx  a week ago, progressed to some hemoptysis which initially abated and then recurred. ED 8/10 then dc home, back 8/11 with the same  P -will [facility] Txa nebs -hold plavix  -cont antitussives -I agree w rec of trial hydrocodone  cough syrup as others have stated, however in d/w pt/daughter this is not something that pt is open to (hx of nausea)  -adding CAP coverage -at family request also adding probiotics 2/2 CAP  coverage  -onc, rad onc have been engaged   CKD 3a P -will check BMP in AM, can likely be PRN after   HTN HLD -based on last cards note-- is on lasix , amlodipine  5mg , with consideration to add back hydral if his BP >160   Anemia -will check CBC in AM   Depression Anxiety Sleep disturbance  -cont home paxil , buspar , seroquel , trazodone    RA -cont home hydroxychloroquine    Hx stroke -holding plavix    Dispo -transfer out of ICU, will ask [facility] to take over care 8/12   Best Practice (right click and Reselect all SmartList Selections daily)   Diet/type: Regular consistency (see orders) DVT prophylaxis SCD Pressure ulcer(s): N/A GI prophylaxis: H2B Lines: N/A Foley:  N/A Code Status:  full code Last date of multidisciplinary goals of care discussion [08/28/23] -- talked w pt and daughter extensively at the bedside 8/11 -- >50 min spent in the room.  Labs   CBC: Recent Labs  Lab 08/27/23 1316 08/27/23 2357 08/28/23 0639  WBC 7.2 8.2 7.2  NEUTROABS 4.9  --   --   HGB 12.6* 10.9* 10.0*  HCT 38.6* 32.9* 31.2*  MCV 99.0 98.5 101.3*  PLT 300 309 270    Basic Metabolic Panel: Recent Labs  Lab 08/27/23 1316 08/27/23 2357 08/28/23 0639  NA 133* 138 140  K 3.7 3.5 4.0  CL 102 108 109  CO2 21* 18* 20*  GLUCOSE 105* 103* 95  BUN 21 40* 52*  CREATININE 1.53* 1.65* 1.49*  CALCIUM  8.6* 8.5* 8.4*  MG  --   --  2.2  PHOS  --   --  3.4   GFR: Estimated Creatinine Clearance: 47.9 mL/min (A) (by C-G formula based on SCr of 1.49 mg/dL (H)). Recent Labs  Lab 08/27/23 1316 08/27/23 2357 08/28/23 0639  WBC 7.2 8.2 7.2    Liver Function Tests: Recent Labs  Lab 08/27/23 2357  AST 22  ALT 17  ALKPHOS 51  BILITOT 0.5  PROT 7.2  ALBUMIN 4.0   No results for input(s): LIPASE, AMYLASE in the last 168 hours. No results for input(s): AMMONIA in the last 168 hours.  ABG    Component Value Date/Time   TCO2 24 11/27/2016 2004     Coagulation  Profile: Recent Labs  Lab 08/27/23 1316 08/27/23 2357  INR 1.0 1.1    Cardiac Enzymes: No results for input(s): CKTOTAL, CKMB, CKMBINDEX, TROPONINI in the last 168 hours.  HbA1C: Hgb A1c MFr Bld  Date/Time Value Ref Range Status  07/01/2023 05:40 AM 5.2 4.8 - 5.6 % Final    Comment:    (NOTE) Diagnosis of Diabetes The following HbA1c ranges recommended by the American Diabetes Association (ADA) may be used as an aid in the diagnosis of diabetes mellitus.  Hemoglobin  Suggested A1C NGSP%              Diagnosis  <5.7                   Non Diabetic  5.7-6.4                Pre-Diabetic  >6.4                   Diabetic  <7.0                   Glycemic control for                       adults with diabetes.    06/04/2020 01:20 PM 5.9 (H) 4.8 - 5.6 % Final    Comment:             Prediabetes: 5.7 - 6.4          Diabetes: >6.4          Glycemic control for adults with diabetes: <7.0     CBG: No results for input(s): GLUCAP in the last 168 hours.  CCT n/a  >50 min time spent  Ronnald Gave MSN, AGACNP-BC [place] Pulmonary/Critical Care Medicine Amion for pager  08/28/2023, 10:24 AM

## 2023-08-28 NOTE — Progress Notes (Signed)
 Notified of black tarry stools -is already off of his plavix   -will add BID PPI -AM CBC already planned (hgb 10 this morning)-- sooner if volume incr or has hemodynamic changes    Ronnald Gave MSN, AGACNP-BC [facility] Pulmonary/Critical Care Medicine 08/28/2023, 11:02 AM

## 2023-08-28 NOTE — H&P (Signed)
 NAME:  [name], MRN:  [id number], DOB:  09/15/1950, LOS: 0 ADMISSION DATE:  08/27/2023, CONSULTATION DATE:  08/28/23  REFERRING MD:  ER , CHIEF COMPLAINT:  Hemoptysis   History of Present Illness:   73yoM patient with MGUS, CHF, recent LUL navigational bronch with biopsy on 08/21/13 positive for NSCLC / adeno, TTF-1+ and p40 negative, here for recurrent hemoptysis, tried delsym  at home. He was in the ER earlier today and seen by Pulmonary, patient stabilized and was discharged home. At home he was eating dinner and had another episode of hemoptysis thus came back to the ER. Patient seen in ER room 10, we reviewed the bronch results and I advised him and his daughter that we will need an Oncology evaluation in regards to prognostics while he is admitted.   Pertinent  Medical History   Past Medical History:  Diagnosis Date   (HFpEF) heart failure with preserved ejection fraction (HCC)    Allergy    Anxiety    Arthritis    Chronic kidney disease    Depression    Frequent urination    GERD (gastroesophageal reflux disease)    Gout    Headache    mirgaines in the past   Herpes    History of nuclear stress test    Myoview  05/2021: EF 77, normal perfusion, low risk   Hyperlipidemia    Hypertension    IBS (irritable bowel syndrome)    Memory loss    MGUS (monoclonal gammopathy of unknown significance)    Obesity    Pre-diabetes    Stroke [facility])    Urgency of urination      Significant Hospital Events: Including procedures, antibiotic start and stop dates in addition to other pertinent events     Interim History / Subjective:  08/28/23 - admit to ICU for hemoptysis  Objective    Blood pressure 123/70, pulse 94, resp. rate (!) 24, height 5' 7 (1.702 m), weight 87.1 kg, SpO2 100%.       No intake or output data in the 24 hours ending 08/28/23 0020 Filed Weights   08/27/23 2344  Weight: 87.1 kg    Examination: General: awake, alert  HENT: dry mucous membranes, no  epistaxis Lungs: coughing on exam, mild rhonchi Cardiovascular: RRR, no murmur Abdomen: soft, normal BS Extremities: no edema, no cyanosis Neuro: CNII to CNXII grossly intact, AAOx3    Resolved problem list   Patient Active Problem List   Diagnosis Date Noted   Hemoptysis 08/28/2023   Mass of upper lobe of left lung 08/10/2023   MGUS (monoclonal gammopathy of unknown significance) 11/29/2022   OA (osteoarthritis) of knee 11/15/2021   Primary osteoarthritis of right knee 11/15/2021   Mixed hyperlipidemia 09/24/2021   AKI (acute kidney injury) (HCC) 04/09/2021   Recurrent major depressive disorder, in full remission (HCC) 01/07/2020   Chronic heart failure with preserved ejection fraction (HCC) 01/02/2020   Essential hypertension 01/02/2020   Right upper quadrant abdominal pain 12/16/2019   Precordial chest pain 12/15/2019   Elevated transaminase level 12/15/2019   MDD (major depressive disorder), recurrent, in partial remission (HCC) 02/07/2019   Severe recurrent major depression without psychotic features (HCC) 02/02/2019   Tardive dyskinesia 12/03/2018   Pure hypercholesterolemia 06/14/2017   History of stroke 11/11/2015   Memory loss 10/16/2013   Tremor 04/04/2013     Assessment and Plan   Hemoptysis: - biopsy positive for NSCLC/adeno, TTF-1+ , p40 negative. - recurrent likely due to malignancy, prior bronch. --  antitussives, ativan  one time in ER, also add codeine  cough syrup. -- TXA nebs prn -- Onc eval. -- continue to hold plavix , may need repeat Bronch if recurrent hemoptysis but may eventually benefit from palliative radiation but will defer this to Oncology.  Repeat CTA reviewed on 08/27/23 - IMPRESSION: 1. Negative for pulmonary embolus. 2. Similar to minimally enlarged left upper lobe mass with left hilar adenopathy, compatible with primary bronchogenic carcinoma. 3. New obstruction of the superior segmental left lower lobe bronchus with postobstructive  ground-glass nodularity in the left lower lobe. 4. Stable 10 mm ground-glass right lower lobe nodule. Recommend attention on follow-up. 5.  Aortic atherosclerosis    Admit to ICU Labs/chart/imaging reviewed Neuro checks per protocol Monitor hemodynamics Keep Spo2>92%, monitor for respiratory failure Keep NPO Monitor I/Os, avoid nephrotoxic agents, Foley only for accurate I/Os F/u culture results if ordered, monitor for s/s sepsis. Monitor H&H , transfuse blood products as needed Tight glycemic control eLink TeleICU consult d/w ICU RN and patient's duaghter  Best Practice (right click and Reselect all SmartList Selections daily)   Diet/type: NPO DVT prophylaxis SCD Pressure ulcer(s): N/A GI prophylaxis: H2B Lines: N/A Foley:  N/A Code Status:  full code Last date of multidisciplinary goals of care discussion [08/28/23]  Labs   CBC: Recent Labs  Lab 08/21/23 0930 08/27/23 1316  WBC 7.9 7.2  NEUTROABS  --  4.9  HGB 13.2 12.6*  HCT 39.0 38.6*  MCV 97.7 99.0  PLT 297 300    Basic Metabolic Panel: Recent Labs  Lab 08/27/23 1316  NA 133*  K 3.7  CL 102  CO2 21*  GLUCOSE 105*  BUN 21  CREATININE 1.53*  CALCIUM  8.6*   GFR: Estimated Creatinine Clearance: 46.7 mL/min (A) (by C-G formula based on SCr of 1.53 mg/dL (H)). Recent Labs  Lab 08/21/23 0930 08/27/23 1316  WBC 7.9 7.2    Liver Function Tests: No results for input(s): AST, ALT, ALKPHOS, BILITOT, PROT, ALBUMIN in the last 168 hours. No results for input(s): LIPASE, AMYLASE in the last 168 hours. No results for input(s): AMMONIA in the last 168 hours.  ABG    Component Value Date/Time   TCO2 24 11/27/2016 2004     Coagulation Profile: Recent Labs  Lab 08/27/23 1316  INR 1.0    Cardiac Enzymes: No results for input(s): CKTOTAL, CKMB, CKMBINDEX, TROPONINI in the last 168 hours.  HbA1C: Hgb A1c MFr Bld  Date/Time Value Ref Range Status  07/01/2023 05:40 AM  5.2 4.8 - 5.6 % Final    Comment:    (NOTE) Diagnosis of Diabetes The following HbA1c ranges recommended by the American Diabetes Association (ADA) may be used as an aid in the diagnosis of diabetes mellitus.  Hemoglobin             Suggested A1C NGSP%              Diagnosis  <5.7                   Non Diabetic  5.7-6.4                Pre-Diabetic  >6.4                   Diabetic  <7.0                   Glycemic control for  adults with diabetes.    06/04/2020 01:20 PM 5.9 (H) 4.8 - 5.6 % Final    Comment:             Prediabetes: 5.7 - 6.4          Diabetes: >6.4          Glycemic control for adults with diabetes: <7.0     CBG: No results for input(s): GLUCAP in the last 168 hours.  Review of Systems:   Review of Systems - Negative except what is stated in the HPI   Past Medical History:  He,  has a past medical history of (HFpEF) heart failure with preserved ejection fraction (HCC), Allergy, Anxiety, Arthritis, Chronic kidney disease, Depression, Frequent urination, GERD (gastroesophageal reflux disease), Gout, Headache, Herpes, History of nuclear stress test, Hyperlipidemia, Hypertension, IBS (irritable bowel syndrome), Memory loss, MGUS (monoclonal gammopathy of unknown significance), Obesity, Pre-diabetes, Stroke (HCC), and Urgency of urination.   Surgical History:   Past Surgical History:  Procedure Laterality Date   CHOLECYSTECTOMY N/A 12/17/2019   Procedure: LAPAROSCOPIC CHOLECYSTECTOMY WITH ICG INJECTION;  Surgeon: Ebbie Cough, MD;  Location: [facility] OR;  Service: General;  Laterality: N/A;   disckec     shouler arthroscopy Left    SPINAL FUSION     TONSILLECTOMY     TOTAL KNEE ARTHROPLASTY Right 11/15/2021   Procedure: TOTAL KNEE ARTHROPLASTY;  Surgeon: Melodi Lerner, MD;  Location: [facility] ORS;  Service: Orthopedics;  Laterality: Right;   VIDEO BRONCHOSCOPY WITH ENDOBRONCHIAL NAVIGATION Left 08/21/2023   Procedure: VIDEO BRONCHOSCOPY  WITH ENDOBRONCHIAL NAVIGATION;  Surgeon: Shelah Lamar RAMAN, MD;  Location: [facility] ENDOSCOPY;  Service: Pulmonary;  Laterality: Left;   VIDEO BRONCHOSCOPY WITH ENDOBRONCHIAL ULTRASOUND  08/21/2023   Procedure: BRONCHOSCOPY, WITH EBUS;  Surgeon: Shelah Lamar RAMAN, MD;  Location[facility]COPY;  Service: Pulmonary;;   VIDEO BRONCHOSCOPY WITH RADIAL ENDOBRONCHIAL ULTRASOUND  08/21/2023   Procedure: VIDEO BRONCHOSCOPY WITH RADIAL ENDOBRONCHIAL ULTRASOUND;  Surgeon: Shelah Lamar RAMAN, MD;  Location: [facility] ENDOSCOPY;  Service: Pulmonary;;   WISDOM TOOTH EXTRACTION       Social History:   reports that he quit smoking about 35 years ago. His smoking use included cigarettes. He has never used smokeless tobacco. He reports that he does not drink alcohol  and does not use drugs.   Family History:  His family history includes Cancer in his maternal aunt and maternal aunt; Cancer - Ovarian in his maternal grandmother; Diabetes Mellitus I in his paternal grandmother; Hypertension in his brother, father, mother, paternal grandfather, and sister.   Allergies Allergies  Allergen Reactions   Divalproex Sodium Hives   Lamotrigine Diarrhea and Other (See Comments)    Shakes and unsteadiness     Valproic Acid Hives and Other (See Comments)    Shakes GI problems, too   Amitriptyline Other (See Comments)    Reaction not known   Aspirin  Nausea And Vomiting and Other (See Comments)    Dizziness, weakness and balance issues, and sweats too   Hydrocodone  Nausea And Vomiting   Lisinopril Cough         Lithium Diarrhea and Other (See Comments)    Falls over and shakes    Morphine  Other (See Comments)     'not tolerated well   Nsaids Other (See Comments)    Kidney disease   Oxycodone Nausea And Vomiting   Oxycodone-Acetaminophen  Nausea And Vomiting and Other (See Comments)    Also causes dizziness   Pork Allergy Nausea Only  Home Medications  Prior to Admission medications   Medication Sig Start Date End Date  Taking? Authorizing Provider  acetaminophen  (TYLENOL ) 500 MG tablet Take 1-2 tablets (500-1,000 mg total) by mouth every 6 (six) hours as needed for moderate pain. 11/18/21   Edmisten, Roxie CROME, PA  allopurinol  (ZYLOPRIM ) 100 MG tablet Take 150 mg by mouth daily. 09/14/22   [provider]  amLODipine  (NORVASC ) 10 MG tablet Take 1 tablet (10 mg total) by mouth daily. 07/26/23   Nishan, Peter C, MD  benzonatate  (TESSALON ) 200 MG capsule Take 1 capsule (200 mg total) by mouth 3 (three) times daily as needed for cough. 08/23/23   Shelah Lamar RAMAN, MD  busPIRone  (BUSPAR ) 30 MG tablet Take 30 mg by mouth 2 (two) times daily.    [provider]  cetirizine  (ZYRTEC  ALLERGY) 10 MG tablet Take 1 tablet (10 mg total) by mouth daily. Patient taking differently: Take 10 mg by mouth at bedtime. 04/25/22   Christopher Savannah, PA-C  clopidogrel  (PLAVIX ) 75 MG tablet Take 1 tablet (75 mg total) by mouth daily. Okay to restart this medication on 08/22/2023. 08/21/23   Shelah Lamar RAMAN, MD  dextromethorphan  (DELSYM ) 30 MG/5ML liquid Take 5 mLs (30 mg total) by mouth 2 (two) times daily as needed for cough. 08/27/23   Randol Simmonds, MD  folic acid  (FOLVITE ) 1 MG tablet Take 1 mg by mouth daily. 11/10/21   [provider]  furosemide  (LASIX ) 40 MG tablet Take 1 tablet (40 mg total) by mouth daily. 07/13/23   Santo Stanly LABOR, MD  hydrALAZINE  (APRESOLINE ) 50 MG tablet Take 1.5 tablets (75 mg total) by mouth 2 (two) times daily. 08/16/23   Santo Stanly A, MD  Hydroxychloroquine  Sulfate 400 MG TABS Take 400 mg by mouth at bedtime.    [provider]  Lactobacillus (ACIDOPHILUS) CAPS capsule Take 1 capsule by mouth daily. 07/03/23   Vann, Jessica U, DO  lidocaine  (LIDODERM ) 5 % Place 1 patch onto the skin daily as needed (for pain- Remove & Discard patch within 12 hours or as directed by MD).    [provider]  PARoxetine  (PAXIL -CR) 37.5 MG 24 hr tablet Take 1 tablet (37.5 mg total) by  mouth at bedtime. 08/21/23   Shelah Lamar RAMAN, MD  primidone  (MYSOLINE ) 250 MG tablet TAKE 1 TABLET BY MOUTH TWICE A DAY 07/17/23   Rosemarie Eather RAMAN, MD  QUEtiapine  (SEROQUEL ) 25 MG tablet Take 25 mg by mouth at bedtime.    [provider]  rosuvastatin  (CRESTOR ) 40 MG tablet Take 40 mg by mouth daily.    [provider]  traZODone  (DESYREL ) 100 MG tablet Take 100 mg by mouth at bedtime.    [provider]  valACYclovir  (VALTREX ) 1000 MG tablet Take 1,000 mg by mouth daily.    [provider]  Vitamin D , Ergocalciferol , (DRISDOL ) 1.25 MG (50000 UNIT) CAPS capsule Take 50,000 Units by mouth every Saturday. 09/17/20   [provider]     Critical care time: 60 minutes

## 2023-08-28 NOTE — ED Notes (Signed)
 Pharmacy sent 1mL syringe of oral ativan , 0.25mL to be administered.  Remaining 0.68mL wasted in stericycle and witnessed by [facility], paramedic.  No place to waste medication in [facility] or Pyxis and no physical form sent for waste documentation.

## 2023-08-28 NOTE — Progress Notes (Signed)
 Radiation Oncology         [phone] ________________________________  Initial inpatient Consultation  Name: [name] MRN: [id number]  Date: 08/29/2023  DOB: 1950-02-24  RR:Gnwzd, Aurora, MD  Kara Dorn NOVAK, MD   REFERRING PHYSICIAN: Kara Dorn NOVAK, MD  DIAGNOSIS:    ICD-10-CM   1. Adenocarcinoma of upper lobe of left lung (HCC)  C34.12        Cancer Staging  Adenocarcinoma of upper lobe of left lung [facility]) Staging form: Lung, AJCC V9 - Clinical stage from 08/29/2023: Stage IIIA (cT3, cN1, cM0) - Signed by Izell Domino, MD on 08/29/2023 Stage prefix: Initial diagnosis  Adenocarcinoma of left lung   CHIEF COMPLAINT: Here to discuss management of lung cancer  HISTORY OF PRESENT ILLNESS::[name] is a 72 y.o. male with a recently diagnose lung cancer. He is currently admitted at the inpatient care at [facility]. Patient is a former smoker, smoked from the ages of [age]. He quit approximately 36 years ago. Patient is followed by oncology for monoclonal gammopathy of undetermined significance (MGUS) and has been receiving regular CT scans for lung cancer screening.   Patient presented for a follow up with NP Hope on 08/14/23 post hospitalization for near syncope on 6/. Findings during his hospitalization revealed a density in the left lung on a chest x-ray, which was further investigated with a CT scan and PET scan done on 07/26/23, showing a hypermetabolic 5 cm mass in central left upper lobe, consistent with primary bronchogenic carcinoma along with a 1.2 cm mildly hypermetabolic mass in right parotid gland, consistent with salivary gland neoplasm.   Subsequently, he underwent a video bronchoscopy with endobronchial navigation on 08/21/23 under the care of Dr. Shelah. Surgical pathology of left lung fine needle aspiration showed non-small carcinoma consistent with adenocarcinoma. Immunohistochemical stains performed on the cellblock demonstrate the malignant cells to  be positive for TTF-1 and negative for p40. Left lung brushing was negative for malignancy.      Most recently, patient presented to the ED on 08/27/23 with complains of a chronic cough and hemoptysis. Workup was unremarkable for any new findings. He was then treated with a nebulized TXA but his cough remained after treatment. As a result a Pulmonary consult indicated patient was stabilized and then discharged. However, later that evening, he experienced a recurrent hemoptysis episode causing him to return to the ER and transferred for inpatient care to undergo TXA treatment and an oncology consult.     He remains an inpatient and is seen today with his daughter who is very supportive.  She reports that they have hired home aides as he does need some help with daily living.  He also needs transportation to his appointments.  She reports that he has cognitive challenges.  He lives in [place].  He continues to have a significant cough and the patient and his daughter are concerned about controlling this  PREVIOUS RADIATION THERAPY: No  PAST MEDICAL HISTORY:  has a past medical history of (HFpEF) heart failure with preserved ejection fraction (HCC), Allergy, Anxiety, Arthritis, Chronic kidney disease, Depression, Frequent urination, GERD (gastroesophageal reflux disease), Gout, Headache, Herpes, History of nuclear stress test, Hyperlipidemia, Hypertension, IBS (irritable bowel syndrome), Memory loss, MGUS (monoclonal gammopathy of unknown significance), Obesity, Pre-diabetes, Stroke (HCC), and Urgency of urination.    PAST SURGICAL HISTORY: Past Surgical History:  Procedure Laterality Date   CHOLECYSTECTOMY N/A 12/17/2019   Procedure: LAPAROSCOPIC CHOLECYSTECTOMY WITH ICG INJECTION;  Surgeon: Ebbie Cough, MD;  Location: [facility] OR;  Service: General;  Laterality: N/A;   disckec     shouler arthroscopy Left    SPINAL FUSION     TONSILLECTOMY     TOTAL KNEE ARTHROPLASTY Right 11/15/2021    Procedure: TOTAL KNEE ARTHROPLASTY;  Surgeon: Melodi Lerner, MD;  Location: [facility] ORS;  Service: Orthopedics;  Laterality: Right;   VIDEO BRONCHOSCOPY WITH ENDOBRONCHIAL NAVIGATION Left 08/21/2023   Procedure: VIDEO BRONCHOSCOPY WITH ENDOBRONCHIAL NAVIGATION;  Surgeon: Shelah Lamar RAMAN, MD;  Location[facility]OSCOPY;  Service: Pulmonary;  Laterality: Left;   VIDEO BRONCHOSCOPY WITH ENDOBRONCHIAL ULTRASOUND  08/21/2023   Procedure: BRONCHOSCOPY, WITH EBUS;  Surgeon: Shelah Lamar RAMAN, MD;  Location: [facility] ENDOSCOPY;  Service: Pulmonary;;   VIDEO BRONCHOSCOPY WITH RADIAL ENDOBRONCHIAL ULTRASOUND  08/21/2023   Procedure: VIDEO BRONCHOSCOPY WITH RADIAL ENDOBRONCHIAL ULTRASOUND;  Surgeon: Shelah Lamar RAMAN, MD;  Location: [facility] ENDOSCOPY;  Service: Pulmonary;;   WISDOM TOOTH EXTRACTION      FAMILY HISTORY: family history includes Cancer in his maternal aunt and maternal aunt; Cancer - Ovarian in his maternal grandmother; Diabetes Mellitus I in his paternal grandmother; Hypertension in his brother, father, mother, paternal grandfather, and sister.  SOCIAL HISTORY:  reports that he quit smoking about 35 years ago. His smoking use included cigarettes. He has never used smokeless tobacco. He reports that he does not drink alcohol  and does not use drugs.  ALLERGIES: Divalproex sodium, Lamotrigine, Valproic acid, Amitriptyline, Aspirin , Hydrocodone , Lisinopril, Lithium, Morphine , Nsaids, Oxycodone, Oxycodone-acetaminophen , and Pork allergy  MEDICATIONS:  No current facility-administered medications for this encounter.   No current outpatient medications on file.   Facility-Administered Medications Ordered in Other Encounters  Medication Dose Route Frequency Provider Last Rate Last Admin   albuterol  (PROVENTIL ) (2.5 MG/3ML) 0.083% nebulizer solution 2.5 mg  2.5 mg Nebulization Q4H PRN Bowser, Grace E, NP   2.5 mg at 08/29/23 9167   allopurinol  (ZYLOPRIM ) tablet 150 mg  150 mg Oral Daily Bowser, Grace E, NP   150 mg at  08/28/23 1222   amLODipine  (NORVASC ) tablet 10 mg  10 mg Oral Daily Kara Carrier B, MD   10 mg at 08/28/23 1508   azithromycin  (ZITHROMAX ) 500 mg in sodium chloride  0.9 % 250 mL IVPB  500 mg Intravenous Q24H Bowser, Grace E, NP   Stopped at 08/28/23 1059   benzonatate  (TESSALON ) capsule 200 mg  200 mg Oral Q4H PRN Kara Carrier NOVAK, MD   200 mg at 08/28/23 1936   busPIRone  (BUSPAR ) tablet 30 mg  30 mg Oral BID Bowser, Grace E, NP   30 mg at 08/28/23 2107   cefTRIAXone  (ROCEPHIN ) 2 g in sodium chloride  0.9 % 100 mL IVPB  2 g Intravenous Q24H Ellington, Abby K, RPH   Stopped at 08/28/23 1022   Chlorhexidine  Gluconate Cloth 2 % PADS 6 each  6 each Topical Daily Patel, Hamish, DO   6 each at 08/29/23 0558   dextromethorphan  (DELSYM ) 30 MG/5ML liquid 30 mg  30 mg Oral BID Bowser, Grace E, NP   30 mg at 08/28/23 2301   docusate sodium  (COLACE) capsule 100 mg  100 mg Oral BID PRN Tobie Pica, DO       folic acid  (FOLVITE ) tablet 1 mg  1 mg Oral Daily Bowser, Grace E, NP   1 mg at 08/28/23 1221   furosemide  (LASIX ) tablet 40 mg  40 mg Oral Daily Bowser, Grace E, NP   40 mg at 08/28/23 1222   guaiFENesin  (ROBITUSSIN) 100 MG/5ML liquid 10 mL  10 mL Oral Q4H PRN Bowser, Grace E, NP   10 mL at 08/28/23 2107   HYDROcodone  bit-homatropine (HYCODAN) 5-1.5 MG/5ML syrup 5 mL  5 mL Oral Q4H PRN Joelyn Satterfield, MD   5 mL at 08/29/23 0528   hydroxychloroquine  (PLAQUENIL ) tablet 400 mg  400 mg Oral QHS Bowser, Grace E, NP   400 mg at 08/28/23 2112   Oral care mouth rinse  15 mL Mouth Rinse PRN Tobie, Hamish, DO       pantoprazole  (PROTONIX ) injection 40 mg  40 mg Intravenous Q12H Daren Ronnald BRAVO, NP   40 mg at 08/28/23 2106   PARoxetine  (PAXIL -CR) 24 hr tablet 37.5 mg  37.5 mg Oral Daily Patel, Hamish, DO   37.5 mg at 08/28/23 1007   polyethylene glycol (MIRALAX  / GLYCOLAX ) packet 17 g  17 g Oral Daily PRN Tobie, Hamish, DO       QUEtiapine  (SEROQUEL ) tablet 25 mg  25 mg Oral QHS Bowser, Grace E, NP   25 mg at  08/28/23 2107   rosuvastatin  (CRESTOR ) tablet 40 mg  40 mg Oral QHS Bowser, Grace E, NP   40 mg at 08/28/23 2107   saccharomyces boulardii (FLORASTOR) capsule 250 mg  250 mg Oral BID Bowser, Grace E, NP   250 mg at 08/28/23 2107   tranexamic acid  (CYKLOKAPRON ) 1000 MG/10ML nebulizer solution 500 mg  500 mg Nebulization Q8H Bowser, Grace E, NP   500 mg at 08/29/23 9379   traZODone  (DESYREL ) tablet 100 mg  100 mg Oral QHS Daren Ronnald BRAVO, NP   100 mg at 08/28/23 2107    REVIEW OF SYSTEMS:  Notable for that above.   PHYSICAL EXAM:  height is 5' 7 (1.702 m). His oral temperature is 98.1 F (36.7 C). His blood pressure is 142/76 (abnormal) and his pulse is 88. His respiration is 20 and oxygen saturation is 94%.   General: Alert and oriented, in no acute distress lying on a gurney HEENT: Head is normocephalic. Extraocular movements are intact.  No oral thrush Neck: Neck is supple  Heart: Regular in rate and rhythm with soft murmur. Chest: Clear to auscultation bilaterally, with no rhonchi, wheezes, or rales. Abdomen: Soft, nontender, nondistended, with no rigidity or guarding. Extremities: No edema.  Skin: No concerning lesions. Musculoskeletal: He is lying on a gurney.  He is well-nourished.  Gait was not tested. Neurologic: Cranial nerves II through XII are grossly intact. No obvious focalities. Speech is fluent. Coordination is intact. Psychiatric:  Affect is appropriate.  He is alert and conversive and pleasant to speak with   ECOG = 2  0 - Asymptomatic (Fully active, able to carry on all predisease activities without restriction)  1 - Symptomatic but completely ambulatory (Restricted in physically strenuous activity but ambulatory and able to carry out work of a light or sedentary nature. For example, light housework, office work)  2 - Symptomatic, <50% in bed during the day (Ambulatory and capable of all self care but unable to carry out any work activities. Up and about more than 50%  of waking hours)  3 - Symptomatic, >50% in bed, but not bedbound (Capable of only limited self-care, confined to bed or chair 50% or more of waking hours)  4 - Bedbound (Completely disabled. Cannot carry on any self-care. Totally confined to bed or chair)  5 - Death   Raylene MM, Creech RH, Tormey DC, et al. [phone]). Toxicity and response criteria of the [facility]. Am. JINNY.  Clin. Oncol. 5 (6): 649-55   LABORATORY DATA:  Lab Results  Component Value Date   WBC 7.1 08/29/2023   HGB 8.6 (L) 08/29/2023   HCT 26.2 (L) 08/29/2023   MCV 101.2 (H) 08/29/2023   PLT 242 08/29/2023   CMP     Component Value Date/Time   NA 139 08/29/2023 0332   NA 140 07/27/2023 1454   K 3.5 08/29/2023 0332   CL 110 08/29/2023 0332   CO2 20 (L) 08/29/2023 0332   GLUCOSE 107 (H) 08/29/2023 0332   BUN 45 (H) 08/29/2023 0332   BUN 15 07/27/2023 1454   CREATININE 1.48 (H) 08/29/2023 0332   CREATININE 1.43 (H) 11/18/2022 1112   CALCIUM  8.3 (L) 08/29/2023 0332   PROT 7.2 08/27/2023 2357   PROT 7.3 05/29/2014 1503   ALBUMIN 4.0 08/27/2023 2357   ALBUMIN 4.7 05/29/2014 1503   AST 22 08/27/2023 2357   AST 20 11/18/2022 1112   ALT 17 08/27/2023 2357   ALT 16 11/18/2022 1112   ALKPHOS 51 08/27/2023 2357   BILITOT 0.5 08/27/2023 2357   BILITOT 0.3 11/18/2022 1112   EGFR 53 (L) 07/27/2023 1454   GFRNONAA 50 (L) 08/29/2023 0332   GFRNONAA 53 (L) 11/18/2022 1112         RADIOGRAPHY: DG Chest Port 1 View Result Date: 08/29/2023 CLINICAL DATA:  Shortness of breath. EXAM: PORTABLE CHEST 1 VIEW COMPARISON:  Chest CTA 08/27/2023 chest radiograph 08/27/2023 FINDINGS: Again noted is the mass in the left upper lobe/left suprahilar region. Hazy densities in the left lung most likely associated with some volume loss. Right lung remains clear. Heart size is stable and within normal limits. Trachea is midline. Negative for a pneumothorax. No acute bone abnormality. IMPRESSION: 1. Persistent mass in  the left upper lobe. 2. Hazy densities in the left lung are most compatible with volume loss. Electronically Signed   By: Juliene Balder M.D.   On: 08/29/2023 08:28   MR BRAIN W WO CONTRAST Result Date: 08/28/2023 CLINICAL DATA:  Metastatic disease evaluation EXAM: MRI HEAD WITHOUT AND WITH CONTRAST TECHNIQUE: Multiplanar, multiecho pulse sequences of the brain and surrounding structures were obtained without and with intravenous contrast. CONTRAST:  8mL GADAVIST  GADOBUTROL  1 MMOL/ML IV SOLN COMPARISON:  None Available. FINDINGS: MRI brain: There are multiple foci of encephalomalacia in the periventricular white matter of both hemispheres, left more than right with corpus callosal atrophy. There is no abnormal enhancement. No evidence of metastatic disease. There is no acute or chronic infarct. The ventricles are normal. No mass lesion. There are normal flow signals in the carotid arteries and basilar artery. No significant bone marrow signal abnormality. No significant abnormality in the paranasal sinuses or soft tissues. IMPRESSION: 1. There is no evidence of metastatic disease 2. There are multiple periventricular foci of encephalomalacia, left hemisphere more than the right, with atrophy of the corpus callosum. The pattern of disease is suggestive of chronic demyelination Electronically Signed   By: Nancyann Burns M.D.   On: 08/28/2023 11:54   CT Angio Chest PE W and/or Wo Contrast Result Date: 08/27/2023 CLINICAL DATA:  Hemoptysis.  Lung mass.  * Tracking Code: BO * EXAM: CT ANGIOGRAPHY CHEST WITH CONTRAST TECHNIQUE: Multidetector CT imaging of the chest was performed using the standard protocol during bolus administration of intravenous contrast. Multiplanar CT image reconstructions and MIPs were obtained to evaluate the vascular anatomy. RADIATION DOSE REDUCTION: This exam was performed according to the departmental dose-optimization program which includes automated exposure control,  adjustment of the mA  and/or kV according to patient size and/or use of iterative reconstruction technique. CONTRAST:  75mL OMNIPAQUE  IOHEXOL  350 MG/ML SOLN COMPARISON:  PET 07/26/2023 and CT chest 06/30/2023. FINDINGS: Cardiovascular: Negative for pulmonary embolus. Atherosclerotic calcification of the aorta. Heart is at the upper limits of normal in size to mildly enlarged. No pericardial effusion. Enlarged pulmonic trunk. Mediastinum/Nodes: Left hilar adenopathy measures up to 1.5 cm. No pathologically enlarged mediastinal or axillary lymph nodes. Air in the esophagus can be seen with dysmotility. Lungs/Pleura: Left upper lobe mass measures approximately 5.0 x 5.6 cm, stable to minimally enlarged from 4.5 x 5.2 cm on 06/30/2023. Long border of contact with the adjacent mediastinum with extension to the left hilum. 10 mm ground-glass nodule in the posterolateral right lower lobe (12/77), unchanged. Newly obstructed superior segmental left lower lobe bronchus with mild postobstructive ground-glass nodularity in the left lower lobe. No pleural fluid. Airway is otherwise unremarkable. Upper Abdomen: Hepatic cysts. Cholecystectomy. Low-attenuation lesions in the kidneys. No specific follow-up necessary. Elevated left hemidiaphragm. Visualized portions of the liver, adrenal glands, kidneys, spleen, pancreas, stomach and bowel are otherwise grossly unremarkable. No upper abdominal adenopathy. Musculoskeletal: Degenerative changes in the spine. Review of the MIP images confirms the above findings. IMPRESSION: 1. Negative for pulmonary embolus. 2. Similar to minimally enlarged left upper lobe mass with left hilar adenopathy, compatible with primary bronchogenic carcinoma. 3. New obstruction of the superior segmental left lower lobe bronchus with postobstructive ground-glass nodularity in the left lower lobe. 4. Stable 10 mm ground-glass right lower lobe nodule. Recommend attention on follow-up. 5.  Aortic atherosclerosis (ICD10-I70.0). 6.  Electronically Signed   By: Newell Eke M.D.   On: 08/27/2023 17:27   DG Chest Port 1 View Result Date: 08/27/2023 CLINICAL DATA:  Hemoptysis.  Recent bronchoscopy with biopsy. EXAM: PORTABLE CHEST 1 VIEW COMPARISON:  Radiograph 08/21/2023.  PET CT 07/26/2023 FINDINGS: Unchanged mild volume loss in the left hemithorax with left suprahilar opacity corresponding to hypermetabolic mass on PET-CT. No pneumothorax. Stable heart size and mediastinal contours. No new airspace disease. No pulmonary edema. No pleural effusion. IMPRESSION: Unchanged mild volume loss in the left hemithorax with left suprahilar opacity corresponding to hypermetabolic mass on PET-CT. No pneumothorax or new findings. Electronically Signed   By: Andrea Gasman M.D.   On: 08/27/2023 14:05   DG Chest Port 1 View Result Date: 08/21/2023 CLINICAL DATA:  Post bronchoscopy with biopsy. EXAM: PORTABLE CHEST 1 VIEW COMPARISON:  06/30/2023 FINDINGS: Again noted is the left upper lobe suprahilar mass, unchanged since prior study. No pneumothorax following bronchoscopy. Right lung clear. No effusions. Heart is normal size. No acute bony abnormality. IMPRESSION: No pneumothorax following bronchoscopy. Electronically Signed   By: Franky Crease M.D.   On: 08/21/2023 12:40   DG C-ARM BRONCHOSCOPY Result Date: 08/21/2023 C-ARM BRONCHOSCOPY: Fluoroscopy was utilized by the requesting physician.  No radiographic interpretation.      IMPRESSION/PLAN:Adenocarcinoma of upper lobe of left lung (HCC)   Cancer Staging  Adenocarcinoma of upper lobe of left lung [facility]) Staging form: Lung, AJCC V9 - Clinical stage from 08/29/2023: Stage IIIA (cT3, cN1, cM0) - Signed by Izell Domino, MD on 08/29/2023 Stage prefix: Initial diagnosis    Stage IIIA non-small cell lung cancer confirmed via biopsy. No distant metastasis on MRI brain and PET scan. Symptoms include hemoptysis and cough, possibly worsened by Plavix .  Hemoptysis is not copious based on my  personal discussion with patient's hospitalist - Curative intent treatment with radiation therapy  and chemotherapy.  I have personally conferred with Dr. Loretha of medical oncology - Surgery not recommended due to potential side effects and impact on lung function.  I have personally conferred with the thoracic surgical team - Radiation therapy to shrink tumor and preserve lung function   - Risk of radiation-induced secondary cancer  is extremely low - Low risk of serious injury to heart, lungs, or other organs from radiation compared to cancer risk.  Consent form was signed today after a thorough discussion with the patient and his daughter - S CT scan for radiation planning this morning. - Design customized radiation plan using external beam photon radiation. IMRT will be used - Coordinate start of radiation therapy with chemotherapy, aiming to begin next week - Wednesday. - Refer to social work for transportation assistance. - Refer to financial counselor for cost-related inquiries. - Conduct weekly check-ins every Monday during treatment.    On date of service, in total, I spent 60 minutes on this encounter. Patient was seen in person.   __________________________________________   Lauraine Golden, MD  This document serves as a record of services personally performed by Lauraine Golden, MD. It was created on her behalf by Reymundo Cartwright, a trained medical scribe. The creation of this record is based on the scribe's personal observations and the provider's statements to them. This document has been checked and approved by the attending provider.

## 2023-08-29 ENCOUNTER — Other Ambulatory Visit: Payer: Self-pay

## 2023-08-29 ENCOUNTER — Ambulatory Visit
Admit: 2023-08-29 | Discharge: 2023-08-29 | Disposition: A | Discharge status: Home / Self Care | Attending: Radiation Oncology | Admitting: Radiation Oncology

## 2023-08-29 ENCOUNTER — Inpatient Hospital Stay (HOSPITAL_COMMUNITY)

## 2023-08-29 VITALS — BP 142/76 | HR 88 | Temp 98.1°F | Resp 20 | Ht 67.0 in

## 2023-08-29 DIAGNOSIS — I10 Essential (primary) hypertension: Secondary | ICD-10-CM | POA: Diagnosis not present

## 2023-08-29 DIAGNOSIS — C3412 Malignant neoplasm of upper lobe, left bronchus or lung: Secondary | ICD-10-CM

## 2023-08-29 DIAGNOSIS — Z510 Encounter for antineoplastic radiation therapy: Secondary | ICD-10-CM | POA: Insufficient documentation

## 2023-08-29 DIAGNOSIS — R042 Hemoptysis: Secondary | ICD-10-CM | POA: Diagnosis not present

## 2023-08-29 DIAGNOSIS — E785 Hyperlipidemia, unspecified: Secondary | ICD-10-CM | POA: Diagnosis not present

## 2023-08-29 DIAGNOSIS — N1831 Chronic kidney disease, stage 3a: Secondary | ICD-10-CM | POA: Diagnosis not present

## 2023-08-29 LAB — CBC
HCT: 26.2 % — ABNORMAL LOW (ref 39.0–52.0)
Hemoglobin: 8.6 g/dL — ABNORMAL LOW (ref 13.0–17.0)
MCH: 33.2 pg (ref 26.0–34.0)
MCHC: 32.8 g/dL (ref 30.0–36.0)
MCV: 101.2 fL — ABNORMAL HIGH (ref 80.0–100.0)
Platelets: 242 K/uL (ref 150–400)
RBC: 2.59 MIL/uL — ABNORMAL LOW (ref 4.22–5.81)
RDW: 12.5 % (ref 11.5–15.5)
WBC: 7.1 K/uL (ref 4.0–10.5)
nRBC: 0 % (ref 0.0–0.2)

## 2023-08-29 LAB — BASIC METABOLIC PANEL WITH GFR
Anion gap: 9 (ref 5–15)
BUN: 45 mg/dL — ABNORMAL HIGH (ref 8–23)
CO2: 20 mmol/L — ABNORMAL LOW (ref 22–32)
Calcium: 8.3 mg/dL — ABNORMAL LOW (ref 8.9–10.3)
Chloride: 110 mmol/L (ref 98–111)
Creatinine, Ser: 1.48 mg/dL — ABNORMAL HIGH (ref 0.61–1.24)
GFR, Estimated: 50 mL/min — ABNORMAL LOW (ref >60)
Glucose, Bld: 107 mg/dL — ABNORMAL HIGH (ref 70–99)
Potassium: 3.5 mmol/L (ref 3.5–5.1)
Sodium: 139 mmol/L (ref 135–145)

## 2023-08-29 MED ORDER — IPRATROPIUM-ALBUTEROL 0.5-2.5 (3) MG/3ML IN SOLN
3.0000 mL | Freq: Three times a day (TID) | RESPIRATORY_TRACT | Status: AC
  Administered 2023-08-29 – 2023-08-30 (×6): 3 mL via RESPIRATORY_TRACT
  Filled 2023-08-29 (×3): qty 3

## 2023-08-29 MED ORDER — HYDROCODONE BIT-HOMATROP MBR 5-1.5 MG/5ML PO SOLN
5.0000 mL | ORAL | Status: DC | PRN
  Administered 2023-08-29 – 2023-09-02 (×27): 5 mL via ORAL
  Filled 2023-08-29 (×17): qty 5

## 2023-08-29 NOTE — Plan of Care (Signed)
  Problem: Clinical Measurements: Goal: Ability to maintain clinical measurements within normal limits will improve Outcome: Not Progressing Goal: Diagnostic test results will improve Outcome: Not Progressing   Problem: Activity: Goal: Risk for activity intolerance will decrease Outcome: Not Progressing   Problem: Nutrition: Goal: Adequate nutrition will be maintained Outcome: Not Progressing   Problem: Coping: Goal: Level of anxiety will decrease Outcome: Not Progressing

## 2023-08-29 NOTE — Consult Note (Addendum)
 [facility] CONSULT NOTE  Patient Care Team: [name], MD as PCP - General (Family Medicine) Santo Stanly LABOR, MD as PCP - Cardiology (Cardiology) Jess Roby Masker, MD (Psychiatry) Gearline Norris, MD as Consulting Physician (Nephrology) Pccm, Md, MD (Pulmonary Disease)  CHIEF COMPLAINTS/PURPOSE OF CONSULTATION:  Adenocarcinoma of Lung  REFERRING PHYSICIAN: Dr. Will   HISTORY OF PRESENTING ILLNESS:  [name] 73 y.o. male who was admitted on 08/28/2023 with complaints of recurrent hemoptysis.  Patient's daughter states that he was previously in the ED in June 2025 because he became disoriented and dizzy, his friends at his assisted living facility noted that he was not acting himself.  During that previous hospitalization, lung mass was noted.  Subsequently he has been diagnosed with lung cancer.  Therefore oncology evaluation has been requested. Patient is seen awake alert and oriented laying in bed, just returned from radiation oncology department.  Patient's daughter is at bedside and serves as principal historian, although patient interjects and answers some of the medical questions. Medical history as stated above is significant for recently diagnosed non-small cell lung cancer, adenocarcinoma.  Medical history also includes CKD, IBS, depression, hypertension, hyperlipidemia and MGUS. Surgical history includes cholecystectomy, right knee replacement, and back surgery many years ago. Family history includes 2 paternal aunts with lung cancer. Social history includes 20-year history of tobacco use at 1 pack/day, quit 35 years ago.  Admits to remote history of social alcohol  use and also quit many years ago.  Drug history includes marijuana use many years ago.  Denies occupational hazardous materials exposure.  Currently lives at an assisted living facility where he performs all his own ADLs.   I have reviewed his chart and materials related to his  cancer extensively and collaborated history with the patient. Summary of oncologic history is as follows: Oncology History  Adenocarcinoma of upper lobe of left lung (HCC)  08/29/2023 Initial Diagnosis   Adenocarcinoma of upper lobe of left lung (HCC)   08/29/2023 Cancer Staging   Staging form: Lung, AJCC V9 - Clinical stage from 08/29/2023: Stage IIIA (cT3, cN1, cM0) - Signed by Izell Domino, MD on 08/29/2023 Stage prefix: Initial diagnosis     ASSESSMENT & PLAN:   Recurrent hemoptysis Persistent cough - May be secondary to malignancy, and recent bronchoscopy - CT angio done 8/10 negative for PE.  Showed known LUL mass with new obstruction superior segmental LLL bronchus. - Continue supportive care  Non-small cell lung cancer, adenocarcinoma, Stage III A,  cT3, cN1,M0                                        - Newly diagnosed August 2025 -PET scan done 07/26/2023 showed 5 cm hypermetabolic mass in the left upper lobe consistent with primary bronchogenic carcinoma. - Bronchoscopy done 08/21/2023.  Cytology was positive for malignancy, non-small cell carcinoma consistent with adenocarcinoma. - Radiation oncology following and patient scheduled to start RT next week. - Medical oncology/Dr. Loretha following.  Dr. Sherrod will manage as outpatient and give further treatment recommendations.  History of CKD - Elevated creatinine and BUN, stable - Avoid nephrotoxic agents - Continue to monitor renal function  Anemia, chronic - Likely multifactorial due to renal dysfunction, malignancy and multiple comorbidities - Hemoglobin 8.6 - Recommend PRBC transfusion for Hgb <7.0 - Continue to monitor CBC with differential  MGUS - Patient previously seen at [facility] in  2024 because found to have elevated M spike on labs from nephrologist. -Recommended continued monitoring of multiple myeloma panel every 6 to 12 months.  Last seen 12/20/2022.    Hypertension Hyperlipidemia - Continue antihypertensives  and statin - Continue to monitor blood pressure    MEDICAL HISTORY:  Past Medical History:  Diagnosis Date   (HFpEF) heart failure with preserved ejection fraction (HCC)    Allergy    Anxiety    Arthritis    Chronic kidney disease    Depression    Frequent urination    GERD (gastroesophageal reflux disease)    Gout    Headache    mirgaines in the past   Herpes    History of nuclear stress test    Myoview  05/2021: EF 77, normal perfusion, low risk   Hyperlipidemia    Hypertension    IBS (irritable bowel syndrome)    Memory loss    MGUS (monoclonal gammopathy of unknown significance)    Obesity    Pre-diabetes    Stroke (HCC)    Urgency of urination     SURGICAL HISTORY: Past Surgical History:  Procedure Laterality Date   CHOLECYSTECTOMY N/A 12/17/2019   Procedure: LAPAROSCOPIC CHOLECYSTECTOMY WITH ICG INJECTION;  Surgeon: Ebbie Cough, MD;  Location: [facility] OR;  Service: General;  Laterality: N/A;   disckec     shouler arthroscopy Left    SPINAL FUSION     TONSILLECTOMY     TOTAL KNEE ARTHROPLASTY Right 11/15/2021   Procedure: TOTAL KNEE ARTHROPLASTY;  Surgeon: Melodi Lerner, MD;  Location: [facility] ORS;  Service: Orthopedics;  Laterality: Right;   VIDEO BRONCHOSCOPY WITH ENDOBRONCHIAL NAVIGATION Left 08/21/2023   Procedure: VIDEO BRONCHOSCOPY WITH ENDOBRONCHIAL NAVIGATION;  Surgeon: Shelah Lamar RAMAN, MD;  Location: [facility] ENDOSCOPY;  Service: Pulmonary;  Laterality: Left;   VIDEO BRONCHOSCOPY WITH ENDOBRONCHIAL ULTRASOUND  08/21/2023   Procedure: BRONCHOSCOPY, WITH EBUS;  Surgeon: Shelah Lamar RAMAN, MD;  Location: [facility] ENDOSCOPY;  Service: Pulmonary;;   VIDEO BRONCHOSCOPY WITH RADIAL ENDOBRONCHIAL ULTRASOUND  08/21/2023   Procedure: VIDEO BRONCHOSCOPY WITH RADIAL ENDOBRONCHIAL ULTRASOUND;  Surgeon: Shelah Lamar RAMAN, MD;  Location: [facility] ENDOSCOPY;  Service: Pulmonary;;   WISDOM TOOTH EXTRACTION      SOCIAL HISTORY: Social History   Socioeconomic History   Marital status: Divorced     Spouse name: Not on file   Number of children: 1   Years of education: Masters   Highest education level: Not on file  Occupational History   Occupation: retired    Associate Professor: RETIRED  Tobacco Use   Smoking status: Former    Current packs/day: 0.00    Types: Cigarettes    Quit date: 1990    Years since quitting: 35.6   Smokeless tobacco: Never  Vaping Use   Vaping status: Never Used  Substance and Sexual Activity   Alcohol  use: No    Alcohol /week: 1.0 standard drink of alcohol     Types: 1 Cans of beer per week   Drug use: No   Sexual activity: Never  Other Topics Concern   Not on file  Social History Narrative   Patient lives in Independent living @ [facility]   Patient is right-handed.   Patient does not drink any caffeine.   Social Drivers of Corporate investment banker Strain: Not on file  Food Insecurity: No Food Insecurity (08/28/2023)   Hunger Vital Sign    Worried About Running Out of Food in the Last Year: Never true    Ran Out  of Food in the Last Year: Never true  Transportation Needs: No Transportation Needs (08/28/2023)   PRAPARE - Administrator, Civil Service (Medical): No    Lack of Transportation (Non-Medical): No  Physical Activity: Not on file  Stress: Not on file  Social Connections: Moderately Isolated (08/28/2023)   Social Connection and Isolation Panel    Frequency of Communication with Friends and Family: More than three times a week    Frequency of Social Gatherings with Friends and Family: Twice a week    Attends Religious Services: Never    Database administrator or Organizations: Yes    Attends Engineer, structural: More than 4 times per year    Marital Status: Divorced  Intimate Partner Violence: Not At Risk (08/28/2023)   Humiliation, Afraid, Rape, and Kick questionnaire    Fear of Current or Ex-Partner: No    Emotionally Abused: No    Physically Abused: No    Sexually Abused: No    FAMILY HISTORY: Family  History  Problem Relation Age of Onset   Hypertension Mother    Hypertension Father    Hypertension Sister    Hypertension Brother    Cancer Maternal Aunt    Cancer - Ovarian Maternal Grandmother    Diabetes Mellitus I Paternal Grandmother    Hypertension Paternal Grandfather    Cancer Maternal Aunt      PHYSICAL EXAMINATION: ECOG PERFORMANCE STATUS: 2 - Symptomatic, <50% confined to bed  Vitals:   08/29/23 0730 08/29/23 0832  BP:    Pulse: 79   Resp: 16   Temp:    SpO2: 97% 94%   Filed Weights   08/27/23 2344 08/28/23 0245 08/29/23 0400  Weight: 192 lb (87.1 kg) 191 lb 12.8 oz (87 kg) 191 lb 12.8 oz (87 kg)    GENERAL: alert, no distress and comfortable SKIN: skin color, texture, turgor are normal, no rashes or significant lesions EYES: normal, conjunctiva are pink and non-injected, sclera clear OROPHARYNX: no exudate, no erythema and lips, buccal mucosa, and tongue normal  NECK: supple, thyroid  normal size, non-tender, without nodularity LYMPH: no palpable lymphadenopathy in the cervical, axillary or inguinal LUNGS: clear to auscultation and percussion with normal breathing effort HEART: regular rate & rhythm and no murmurs and no lower extremity edema ABDOMEN: abdomen soft, non-tender and normal bowel sounds MUSCULOSKELETAL: no cyanosis of digits and no clubbing  PSYCH: alert & oriented x 3 with fluent speech NEURO: no focal motor/sensory deficits   ALLERGIES:  is allergic to divalproex sodium, lamotrigine, valproic acid, amitriptyline, aspirin , hydrocodone , lisinopril, lithium, morphine , nsaids, oxycodone, oxycodone-acetaminophen , and pork allergy.  MEDICATIONS:  Current Facility-Administered Medications  Medication Dose Route Frequency Provider Last Rate Last Admin   albuterol  (PROVENTIL ) (2.5 MG/3ML) 0.083% nebulizer solution 2.5 mg  2.5 mg Nebulization Q4H PRN Bowser, Grace E, NP   2.5 mg at 08/29/23 9167   allopurinol  (ZYLOPRIM ) tablet 150 mg  150 mg Oral  Daily Bowser, Grace E, NP   150 mg at 08/28/23 1222   amLODipine  (NORVASC ) tablet 10 mg  10 mg Oral Daily Kara Carrier B, MD   10 mg at 08/28/23 1508   azithromycin  (ZITHROMAX ) 500 mg in sodium chloride  0.9 % 250 mL IVPB  500 mg Intravenous Q24H Bowser, Grace E, NP   Stopped at 08/28/23 1059   benzonatate  (TESSALON ) capsule 200 mg  200 mg Oral Q4H PRN Dewald, Jonathan B, MD   200 mg at 08/28/23 1936   busPIRone  (BUSPAR ) tablet  30 mg  30 mg Oral BID Daren Ronnald BRAVO, NP   30 mg at 08/28/23 2107   cefTRIAXone  (ROCEPHIN ) 2 g in sodium chloride  0.9 % 100 mL IVPB  2 g Intravenous Q24H Bethene Stefano POUR, RPH   Stopped at 08/28/23 1022   Chlorhexidine  Gluconate Cloth 2 % PADS 6 each  6 each Topical Daily Tobie Pica, DO   6 each at 08/29/23 9441   dextromethorphan  (DELSYM ) 30 MG/5ML liquid 30 mg  30 mg Oral BID Daren Ronnald BRAVO, NP   30 mg at 08/28/23 2301   docusate sodium  (COLACE) capsule 100 mg  100 mg Oral BID PRN Tobie Pica, DO       folic acid  (FOLVITE ) tablet 1 mg  1 mg Oral Daily Bowser, Grace E, NP   1 mg at 08/28/23 1221   furosemide  (LASIX ) tablet 40 mg  40 mg Oral Daily Daren Ronnald BRAVO, NP   40 mg at 08/28/23 1222   guaiFENesin  (ROBITUSSIN) 100 MG/5ML liquid 10 mL  10 mL Oral Q4H PRN Daren Ronnald BRAVO, NP   10 mL at 08/28/23 2107   HYDROcodone  bit-homatropine (HYCODAN) 5-1.5 MG/5ML syrup 5 mL  5 mL Oral Q4H PRN Joelyn Satterfield, MD   5 mL at 08/29/23 9471   hydroxychloroquine  (PLAQUENIL ) tablet 400 mg  400 mg Oral QHS Daren Ronnald BRAVO, NP   400 mg at 08/28/23 2112   Oral care mouth rinse  15 mL Mouth Rinse PRN Tobie, Hamish, DO       pantoprazole  (PROTONIX ) injection 40 mg  40 mg Intravenous Q12H Daren Ronnald BRAVO, NP   40 mg at 08/28/23 2106   PARoxetine  (PAXIL -CR) 24 hr tablet 37.5 mg  37.5 mg Oral Daily Patel, Hamish, DO   37.5 mg at 08/28/23 1007   polyethylene glycol (MIRALAX  / GLYCOLAX ) packet 17 g  17 g Oral Daily PRN Tobie, Hamish, DO       QUEtiapine  (SEROQUEL ) tablet 25 mg  25 mg  Oral QHS Daren Ronnald BRAVO, NP   25 mg at 08/28/23 2107   rosuvastatin  (CRESTOR ) tablet 40 mg  40 mg Oral QHS Daren Ronnald BRAVO, NP   40 mg at 08/28/23 2107   saccharomyces boulardii (FLORASTOR) capsule 250 mg  250 mg Oral BID Daren Ronnald BRAVO, NP   250 mg at 08/28/23 2107   tranexamic acid  (CYKLOKAPRON ) 1000 MG/10ML nebulizer solution 500 mg  500 mg Nebulization Q8H Bowser, Ronnald BRAVO, NP   500 mg at 08/29/23 9379   traZODone  (DESYREL ) tablet 100 mg  100 mg Oral QHS Daren Ronnald BRAVO, NP   100 mg at 08/28/23 2107     LABORATORY DATA:  I have reviewed the data as listed Lab Results  Component Value Date   WBC 7.1 08/29/2023   HGB 8.6 (L) 08/29/2023   HCT 26.2 (L) 08/29/2023   MCV 101.2 (H) 08/29/2023   PLT 242 08/29/2023   Recent Labs    06/30/23 2102 07/01/23 0540 07/02/23 0544 08/27/23 2357 08/28/23 0639 08/29/23 0332  NA 133* 135   < > 138 140 139  K 4.2 4.2   < > 3.5 4.0 3.5  CL 103 107   < > 108 109 110  CO2 22 21*   < > 18* 20* 20*  GLUCOSE 88 89   < > 103* 95 107*  BUN 28* 25*   < > 40* 52* 45*  CREATININE 2.07* 1.71*   < > 1.65* 1.49* 1.48*  CALCIUM  8.1* 8.3*   < >  8.5* 8.4* 8.3*  GFRNONAA 34* 42*   < > 44* 50* 50*  PROT 6.6 6.2*  --  7.2  --   --   ALBUMIN 3.6 3.5  --  4.0  --   --   AST 24 22  --  22  --   --   ALT 23 24  --  17  --   --   ALKPHOS 48 49  --  51  --   --   BILITOT 0.7 0.5  --  0.5  --   --    < > = values in this interval not displayed.    RADIOGRAPHIC STUDIES: I have personally reviewed the radiological images as listed and agreed with the findings in the report. DG Chest Port 1 View Result Date: 08/29/2023 CLINICAL DATA:  Shortness of breath. EXAM: PORTABLE CHEST 1 VIEW COMPARISON:  Chest CTA 08/27/2023 chest radiograph 08/27/2023 FINDINGS: Again noted is the mass in the left upper lobe/left suprahilar region. Hazy densities in the left lung most likely associated with some volume loss. Right lung remains clear. Heart size is stable and within normal  limits. Trachea is midline. Negative for a pneumothorax. No acute bone abnormality. IMPRESSION: 1. Persistent mass in the left upper lobe. 2. Hazy densities in the left lung are most compatible with volume loss. Electronically Signed   By: Juliene Balder M.D.   On: 08/29/2023 08:28   MR BRAIN W WO CONTRAST Result Date: 08/28/2023 CLINICAL DATA:  Metastatic disease evaluation EXAM: MRI HEAD WITHOUT AND WITH CONTRAST TECHNIQUE: Multiplanar, multiecho pulse sequences of the brain and surrounding structures were obtained without and with intravenous contrast. CONTRAST:  8mL GADAVIST  GADOBUTROL  1 MMOL/ML IV SOLN COMPARISON:  None Available. FINDINGS: MRI brain: There are multiple foci of encephalomalacia in the periventricular white matter of both hemispheres, left more than right with corpus callosal atrophy. There is no abnormal enhancement. No evidence of metastatic disease. There is no acute or chronic infarct. The ventricles are normal. No mass lesion. There are normal flow signals in the carotid arteries and basilar artery. No significant bone marrow signal abnormality. No significant abnormality in the paranasal sinuses or soft tissues. IMPRESSION: 1. There is no evidence of metastatic disease 2. There are multiple periventricular foci of encephalomalacia, left hemisphere more than the right, with atrophy of the corpus callosum. The pattern of disease is suggestive of chronic demyelination Electronically Signed   By: Nancyann Burns M.D.   On: 08/28/2023 11:54   CT Angio Chest PE W and/or Wo Contrast Result Date: 08/27/2023 CLINICAL DATA:  Hemoptysis.  Lung mass.  * Tracking Code: BO * EXAM: CT ANGIOGRAPHY CHEST WITH CONTRAST TECHNIQUE: Multidetector CT imaging of the chest was performed using the standard protocol during bolus administration of intravenous contrast. Multiplanar CT image reconstructions and MIPs were obtained to evaluate the vascular anatomy. RADIATION DOSE REDUCTION: This exam was performed  according to the departmental dose-optimization program which includes automated exposure control, adjustment of the mA and/or kV according to patient size and/or use of iterative reconstruction technique. CONTRAST:  75mL OMNIPAQUE  IOHEXOL  350 MG/ML SOLN COMPARISON:  PET 07/26/2023 and CT chest 06/30/2023. FINDINGS: Cardiovascular: Negative for pulmonary embolus. Atherosclerotic calcification of the aorta. Heart is at the upper limits of normal in size to mildly enlarged. No pericardial effusion. Enlarged pulmonic trunk. Mediastinum/Nodes: Left hilar adenopathy measures up to 1.5 cm. No pathologically enlarged mediastinal or axillary lymph nodes. Air in the esophagus can be seen with dysmotility. Lungs/Pleura: Left  upper lobe mass measures approximately 5.0 x 5.6 cm, stable to minimally enlarged from 4.5 x 5.2 cm on 06/30/2023. Long border of contact with the adjacent mediastinum with extension to the left hilum. 10 mm ground-glass nodule in the posterolateral right lower lobe (12/77), unchanged. Newly obstructed superior segmental left lower lobe bronchus with mild postobstructive ground-glass nodularity in the left lower lobe. No pleural fluid. Airway is otherwise unremarkable. Upper Abdomen: Hepatic cysts. Cholecystectomy. Low-attenuation lesions in the kidneys. No specific follow-up necessary. Elevated left hemidiaphragm. Visualized portions of the liver, adrenal glands, kidneys, spleen, pancreas, stomach and bowel are otherwise grossly unremarkable. No upper abdominal adenopathy. Musculoskeletal: Degenerative changes in the spine. Review of the MIP images confirms the above findings. IMPRESSION: 1. Negative for pulmonary embolus. 2. Similar to minimally enlarged left upper lobe mass with left hilar adenopathy, compatible with primary bronchogenic carcinoma. 3. New obstruction of the superior segmental left lower lobe bronchus with postobstructive ground-glass nodularity in the left lower lobe. 4. Stable 10 mm  ground-glass right lower lobe nodule. Recommend attention on follow-up. 5.  Aortic atherosclerosis (ICD10-I70.0). 6. Electronically Signed   By: Newell Eke M.D.   On: 08/27/2023 17:27   DG Chest Port 1 View Result Date: 08/27/2023 CLINICAL DATA:  Hemoptysis.  Recent bronchoscopy with biopsy. EXAM: PORTABLE CHEST 1 VIEW COMPARISON:  Radiograph 08/21/2023.  PET CT 07/26/2023 FINDINGS: Unchanged mild volume loss in the left hemithorax with left suprahilar opacity corresponding to hypermetabolic mass on PET-CT. No pneumothorax. Stable heart size and mediastinal contours. No new airspace disease. No pulmonary edema. No pleural effusion. IMPRESSION: Unchanged mild volume loss in the left hemithorax with left suprahilar opacity corresponding to hypermetabolic mass on PET-CT. No pneumothorax or new findings. Electronically Signed   By: Andrea Gasman M.D.   On: 08/27/2023 14:05   DG Chest Port 1 View Result Date: 08/21/2023 CLINICAL DATA:  Post bronchoscopy with biopsy. EXAM: PORTABLE CHEST 1 VIEW COMPARISON:  06/30/2023 FINDINGS: Again noted is the left upper lobe suprahilar mass, unchanged since prior study. No pneumothorax following bronchoscopy. Right lung clear. No effusions. Heart is normal size. No acute bony abnormality. IMPRESSION: No pneumothorax following bronchoscopy. Electronically Signed   By: Franky Crease M.D.   On: 08/21/2023 12:40   DG C-ARM BRONCHOSCOPY Result Date: 08/21/2023 C-ARM BRONCHOSCOPY: Fluoroscopy was utilized by the requesting physician.  No radiographic interpretation.     The total time spent in the appointment was 55 minutes encounter with patients including review of chart and various tests results, discussions about plan of care and coordination of care plan   All questions were answered. The patient knows to call the clinic with any problems, questions or concerns. No barriers to learning was detected.  Olam JINNY Brunner, NP 8/12/20259:29 AM   Attending Note  I  personally saw the patient, reviewed the chart and examined the patient. The plan of care was discussed with the patient and the admitting team. I agree with the assessment and plan as documented above. Thank you very much for the consultation. Agree with concurrent CRT followed by immunotherapy maintenance. He has decent PS, most of the inactivity is likely secondary to lack of desire per daughter.  Dr Sherrod will be following him outpt. NN Megan on board and will coordinate appointments.

## 2023-08-29 NOTE — Progress Notes (Signed)
 NAME:  [name], MRN:  [id number], DOB:  [birth year], LOS: 1 ADMISSION DATE:  08/27/2023, CONSULTATION DATE:  08/28/23  REFERRING MD:  ER , CHIEF COMPLAINT:  Hemoptysis   History of Present Illness:   73yoM patient with MGUS, CHF, recent LUL navigational bronch with biopsy on 08/21/13 positive for NSCLC / adeno, TTF-1+ and p40 negative, here for recurrent hemoptysis, tried delsym  at home. He was in the ER earlier today and seen by Pulmonary, patient stabilized and was discharged home. At home he was eating dinner and had another episode of hemoptysis thus came back to the ER. Patient seen in ER room 10, we reviewed the bronch results and I advised him and his daughter that we will need an Oncology evaluation in regards to prognostics while he is admitted.   Pertinent  Medical History   Past Medical History:  Diagnosis Date   (HFpEF) heart failure with preserved ejection fraction (HCC)    Allergy    Anxiety    Arthritis    Chronic kidney disease    Depression    Frequent urination    GERD (gastroesophageal reflux disease)    Gout    Headache    mirgaines in the past   Herpes    History of nuclear stress test    Myoview  05/2021: EF 77, normal perfusion, low risk   Hyperlipidemia    Hypertension    IBS (irritable bowel syndrome)    Memory loss    MGUS (monoclonal gammopathy of unknown significance)    Obesity    Pre-diabetes    Stroke [facility])    Urgency of urination      Significant Hospital Events: Including procedures, antibiotic start and stop dates in addition to other pertinent events   08/28/23 - admit to ICU for hemoptysis  Interim History / Subjective:   Continues to have coughing fits and some hemoptysis Hycodan cough syrup added overnight with improvement in cough Discussed plan with daughter at bedside, they wish to have cough under better control prior to discharge.   Objective    Blood pressure (!) 148/76, pulse 88, temperature 98 F (36.7 C), temperature  source Oral, resp. rate 19, height 5' 7 (1.702 m), weight 87 kg, SpO2 99%.        Intake/Output Summary (Last 24 hours) at 08/29/2023 0739 Last data filed at 08/29/2023 0200 Gross per 24 hour  Intake 1780.19 ml  Output 1560 ml  Net 220.19 ml   Filed Weights   08/27/23 2344 08/28/23 0245 08/29/23 0400  Weight: 87.1 kg 87 kg 87 kg   Examination: General: AAOx3  HENT: NCAT  Lungs: even unlabored on RA  Cardiovascular: rrr, no murmurs Abdomen: soft  Extremities: no obvious joint deformity  GU defer  Skin c/d/w   Resolved problem list   Patient Active Problem List   Diagnosis Date Noted   Hemoptysis 08/28/2023   Mass of upper lobe of left lung 08/10/2023   MGUS (monoclonal gammopathy of unknown significance) 11/29/2022   OA (osteoarthritis) of knee 11/15/2021   Primary osteoarthritis of right knee 11/15/2021   Mixed hyperlipidemia 09/24/2021   AKI (acute kidney injury) (HCC) 04/09/2021   Recurrent major depressive disorder, in full remission (HCC) 01/07/2020   Chronic heart failure with preserved ejection fraction (HCC) 01/02/2020   Essential hypertension 01/02/2020   Right upper quadrant abdominal pain 12/16/2019   Precordial chest pain 12/15/2019   Elevated transaminase level 12/15/2019   MDD (major depressive disorder), recurrent, in partial remission (HCC)  02/07/2019   Severe recurrent major depression without psychotic features (HCC) 02/02/2019   Tardive dyskinesia 12/03/2018   Pure hypercholesterolemia 06/14/2017   History of stroke 11/11/2015   Memory loss 10/16/2013   Tremor 04/04/2013     Assessment and Plan    LUL NSCLC - new dx   Hemoptysis, mild  - biopsy positive for NSCLC/adeno, TTF-1+ , p40 negative. -had a dry cough following bronch/cryobx  a week ago, progressed to some hemoptysis which initially abated and then recurred. ED 8/10 then dc home, back 8/11 with the same  P -Continue scheduled TXA nebs - Give Duoneb prior to TXA to reduce airway  irritation -hold plavix  -cont antitussives: hycodan cough syrup, tessalon  perles, delsym  - Will add lidocaine  nebs next if continues to have issues -Continue CAP coverage -at family request also adding probiotics 2/2 CAP coverage  -onc, rad onc have been consulted. Plan to start radiation 8/20  Dark Stools - likely due to swallowing hemoptysis at times - started PPI therapy - continue to monitor - trend CBC  CKD 3a P -monitor  HTN HLD -amlodipine , lasix  and statin  Anemia -Hgb trending down - type and screen from 8/11 - transfuse for hemoglobin 7 or less  Depression Anxiety Sleep disturbance  -cont home paxil , buspar , seroquel , trazodone    RA -cont home hydroxychloroquine    Hx stroke -holding plavix     Best Practice (right click and Reselect all SmartList Selections daily)   Diet/type: Regular consistency (see orders) DVT prophylaxis SCD Pressure ulcer(s): N/A GI prophylaxis: H2B Lines: N/A Foley:  N/A Code Status:  full code Last date of multidisciplinary goals of care discussion [08/29/23] -- talked w pt and daughter and patient  Labs   CBC: Recent Labs  Lab 08/27/23 1316 08/27/23 2357 08/28/23 0639 08/29/23 0332  WBC 7.2 8.2 7.2 7.1  NEUTROABS 4.9  --   --   --   HGB 12.6* 10.9* 10.0* 8.6*  HCT 38.6* 32.9* 31.2* 26.2*  MCV 99.0 98.5 101.3* 101.2*  PLT 300 309 270 242    Basic Metabolic Panel: Recent Labs  Lab 08/27/23 1316 08/27/23 2357 08/28/23 0639 08/29/23 0332  NA 133* 138 140 139  K 3.7 3.5 4.0 3.5  CL 102 108 109 110  CO2 21* 18* 20* 20*  GLUCOSE 105* 103* 95 107*  BUN 21 40* 52* 45*  CREATININE 1.53* 1.65* 1.49* 1.48*  CALCIUM  8.6* 8.5* 8.4* 8.3*  MG  --   --  2.2  --   PHOS  --   --  3.4  --    GFR: Estimated Creatinine Clearance: 48.2 mL/min (A) (by C-G formula based on SCr of 1.48 mg/dL (H)). Recent Labs  Lab 08/27/23 1316 08/27/23 2357 08/28/23 0639 08/29/23 0332  WBC 7.2 8.2 7.2 7.1    Liver Function  Tests: Recent Labs  Lab 08/27/23 2357  AST 22  ALT 17  ALKPHOS 51  BILITOT 0.5  PROT 7.2  ALBUMIN 4.0   No results for input(s): LIPASE, AMYLASE in the last 168 hours. No results for input(s): AMMONIA in the last 168 hours.  ABG    Component Value Date/Time   TCO2 24 11/27/2016 2004     Coagulation Profile: Recent Labs  Lab 08/27/23 1316 08/27/23 2357  INR 1.0 1.1    Cardiac Enzymes: No results for input(s): CKTOTAL, CKMB, CKMBINDEX, TROPONINI in the last 168 hours.  HbA1C: Hgb A1c MFr Bld  Date/Time Value Ref Range Status  07/01/2023 05:40 AM 5.2 4.8 - 5.6 %  Final    Comment:    (NOTE) Diagnosis of Diabetes The following HbA1c ranges recommended by the American Diabetes Association (ADA) may be used as an aid in the diagnosis of diabetes mellitus.  Hemoglobin             Suggested A1C NGSP%              Diagnosis  <5.7                   Non Diabetic  5.7-6.4                Pre-Diabetic  >6.4                   Diabetic  <7.0                   Glycemic control for                       adults with diabetes.    06/04/2020 01:20 PM 5.9 (H) 4.8 - 5.6 % Final    Comment:             Prediabetes: 5.7 - 6.4          Diabetes: >6.4          Glycemic control for adults with diabetes: <7.0     CBG: No results for input(s): GLUCAP in the last 168 hours.   Dorn Chill, MD [facility] Office: [phone]   See Amion for personal pager PCCM on call pager [phone] until 7pm. Please call Elink 7p-7a. [phone]

## 2023-08-29 NOTE — TOC Initial Note (Signed)
 Transition of Care [facility]) - Initial/Assessment Note    Patient Details  Name: [name] MRN: [id number] Date of Birth: 1950/10/08  Transition of Care [facility]) CM/SW Contact:    Jon ONEIDA Anon, RN Phone Number: 08/29/2023, 3:54 PM  Clinical Narrative:                 Pt is from Independent Living at [facility]. NCM received a secure chat about pt daughter feeling overwhelmed with pt care and needing assistance. NCM spoke with pt daughter via telephone and she voiced concerns for about her father needing assistance in the home. NCM gave pt daughter information to reach out to [employer] and to follow-up with Gery Seip to learn any resources they can provide to the pt, as far as transportation to and from appointments. Awaiting PT eval for any new recommendations. Care Management continuing to follow.    Expected Discharge Plan: Home/Self Care Barriers to Discharge: Continued Medical Work up   Patient Goals and CMS Choice Patient states their goals for this hospitalization and ongoing recovery are:: To return to Independent Living at [facility].gov Compare Post Acute Care list provided to:: Other (Comment Required) (NA) Choice offered to / list presented to : NA [place] ownership interest in [facility].provided to:: Parent NA    Expected Discharge Plan and Services In-house Referral: NA Discharge Planning Services: CM Consult Post Acute Care Choice: NA Living arrangements for the past 2 months: Independent Living Facility                 DME Arranged: N/A DME Agency: NA       HH Arranged: NA HH Agency: NA        Prior Living Arrangements/Services Living arrangements for the past 2 months: Independent Living Facility Lives with:: Self Patient language and need for interpreter reviewed:: Yes Do you feel safe going back to the place where you live?: Yes      Need for Family Participation in Patient Care: Yes  (Comment) Care giver support system in place?: Yes (comment) Current home services: Other (comment) (NA) Criminal Activity/Legal Involvement Pertinent to Current Situation/Hospitalization: No - Comment as needed  Activities of Daily Living      Permission Sought/Granted Permission sought to share information with : Family Supports Permission granted to share information with : Yes, Verbal Permission Granted  Share Information with NAME: Nadeem, Romanoski (Daughter)  [phone]           Emotional Assessment Appearance:: Appears stated age Attitude/Demeanor/Rapport: Engaged Affect (typically observed): Appropriate Orientation: : Oriented to Self, Oriented to Place, Oriented to  Time, Oriented to Situation Alcohol  / Substance Use: Not Applicable Psych Involvement: No (comment)  Admission diagnosis:  Hemoptysis [R04.2] Patient Active Problem List   Diagnosis Date Noted   Adenocarcinoma of upper lobe of left lung (HCC) 08/29/2023   Hemoptysis 08/28/2023   Mass of upper lobe of left lung 08/10/2023   MGUS (monoclonal gammopathy of unknown significance) 11/29/2022   OA (osteoarthritis) of knee 11/15/2021   Primary osteoarthritis of right knee 11/15/2021   Mixed hyperlipidemia 09/24/2021   AKI (acute kidney injury) (HCC) 04/09/2021   Recurrent major depressive disorder, in full remission (HCC) 01/07/2020   Chronic heart failure with preserved ejection fraction (HCC) 01/02/2020   Essential hypertension 01/02/2020   Right upper quadrant abdominal pain 12/16/2019   Precordial chest pain 12/15/2019   Elevated transaminase level 12/15/2019   MDD (major depressive disorder), recurrent, in partial remission (  HCC) 02/07/2019   Severe recurrent major depression without psychotic features (HCC) 02/02/2019   Tardive dyskinesia 12/03/2018   Pure hypercholesterolemia 06/14/2017   History of stroke 11/11/2015   Memory loss 10/16/2013   Tremor 04/04/2013   PCP:  [name],  MD Pharmacy:   [facility], [place] - [address] [place] [address] Phone: [phone] Fax: [phone]  [facility] - [facility] [address] [place] [address] Phone: [phone] Fax: [phone]     Social Drivers of Health (SDOH) Social History: SDOH Screenings   Food Insecurity: No Food Insecurity (08/28/2023)  Housing: Low Risk  (08/28/2023)  Transportation Needs: No Transportation Needs (08/28/2023)  Utilities: Not At Risk (08/28/2023)  Alcohol  Screen: Low Risk  (02/02/2019)  Depression (PHQ2-9): Medium Risk (06/29/2021)  Social Connections: Moderately Isolated (08/28/2023)  Tobacco Use: Medium Risk (08/27/2023)   SDOH Interventions:     Readmission Risk Interventions    08/29/2023    3:50 PM 07/03/2023    1:33 PM  Readmission Risk Prevention Plan  Transportation Screening Complete Complete  PCP or Specialist Appt within 5-7 Days  Complete  PCP or Specialist Appt within 3-5 Days Complete   Home Care Screening  Complete  Medication Review (RN CM)  Complete  HRI or Home Care Consult Complete   Social Work Consult for Recovery Care Planning/Counseling Complete   Palliative Care Screening Complete   Medication Review Oceanographer) Complete

## 2023-08-29 NOTE — Progress Notes (Signed)
 eLink Physician-Brief Progress Note Patient Name: [name] DOB: 1950-05-19 MRN: [id number]   Date of Service  08/29/2023  HPI/Events of Note  Patient has a persistent cough.  He is getting only temporary relief from prescribed medications which include Delsym , Tessalon  and Robitussin.  eICU Interventions  Patient's chart reviewed.  He has a listed allergy to hydrocodone  but the reaction states nausea and possible vomiting.  Spoke to patient.  He is not sure what the reaction is because he has not had it in so long but he is willing to try it if it will help his cough.  Hycodan syrup ordered every 4 hours as needed for cough.  RN informed.     Intervention Category Minor Interventions: Routine modifications to care plan (e.g. PRN medications for pain, fever)  Jerilynn Berg 08/29/2023, 1:14 AM

## 2023-08-29 NOTE — Progress Notes (Signed)
 PROGRESS NOTE    [name]  FMW:[id number] DOB: 03-13-1950 DOA: 08/27/2023 PCP: [name], MD  Brief Narrative: This patient was admitted on 08/28/2023 and TRH pickup 08/29/2023.  Essentially this 73 year old male with MGUS, CHF, status post bronc 08/22/2023 positive for non-small cell carcinoma admitted with recurrent hemoptysis in spite of trying multiple antitussive  antitussives at home.  He came to the ER on the 11th with hemoptysis and cough seen by pulmonary stabilized and was discharged home.  He came back the same night with episode of hemoptysis and cough and was admitted to the hospital.  Seen by oncology and radiation oncology at this time.  Plan is for simulation today for radiation to start next Wednesday. 08/29/2023 patient's daughter reports that he had a rough night as he was not vigorously coughing until he got the Hycodan.  After he got the Hycodan patient slept.   Assessment & Plan:   Principal Problem:   Hemoptysis   #1 persistent cough and hemoptysis secondary to newly diagnosed left upper lobe non-small cell carcinoma status post bronc and biopsy on 08/22/2023. Holding Plavix  Continuing Delsym , Hycodan cough syrup, Mucinex  TXA nebs Chest x-ray from today shows persistent mass in the left upper lobe and hazy densities in the left lung most compatible with volume loss. MRI brainThere is no evidence of metastatic disease.There are multiple periventricular foci of encephalomalacia, left hemisphere more than the right, with atrophy of the corpus callosum. The pattern of disease is suggestive of chronic demyelination CT chest- Negative for pulmonary embolus. Similar to minimally enlarged left upper lobe mass with left hilar adenopathy, compatible with primary bronchogenic carcinoma New obstruction of the superior segmental left lower lobe bronchus with postobstructive ground-glass nodularity in the left lower lobe.  Stable 10 mm ground-glass right lower lobe nodule.  Recommend attention on follow-up. He was started on Rocephin  and azithromycin  for CAP coverage on 08/28/2023    #2 hypertension blood pressure 142/76 on Norvasc  and Lasix  prior to admission  #3 CKD stage III A stable  creatinine 1.48  #4 history of chronic anemia-hb 8.6 from 10 from 10.9 from 12.6 from 13.2 (received ivf too)  #5 anxiety/depression/insomnia on Paxil  BuSpar  Seroquel  and trazodone   #6 history of rheumatoid arthritis on hydroxychloroquine   #7 history of stroke on Plavix  currently on hold due to hemoptysis.  #8 history of gout on allopurinol    Estimated body mass index is 30.04 kg/m as calculated from the following:   Height as of this encounter: 5' 7 (1.702 m).   Weight as of this encounter: 87 kg.  DVT prophylaxis: scd Code Status:full Family Communication: daughter Disposition Plan:  Status is: Inpatient Remains inpatient appropriate because: hemoptysis cough   Consultants:  PCCM Radiation oncology Medical oncology  Procedures:none Antimicrobials Rocephin  and azithromycin   Subjective:  Daughter concerned that patient is getting bronchospasm after getting the TXA nebs and has been coughing more.  He got Hycodan cough syrup sometime in the night after that he slept some Seen by radiation oncology and oncology planning for simulation today Objective: Vitals:   08/29/23 0528 08/29/23 0600 08/29/23 0730 08/29/23 0832  BP:  (!) 148/76    Pulse:  88 79   Resp:  19 16   Temp: 98 F (36.7 C)     TempSrc: Oral     SpO2:  99% 97% 94%  Weight:      Height:        Intake/Output Summary (Last 24 hours) at 08/29/2023 0920 Last data filed at 08/29/2023  0200 Gross per 24 hour  Intake 1630.19 ml  Output 1560 ml  Net 70.19 ml   Filed Weights   08/27/23 2344 08/28/23 0245 08/29/23 0400  Weight: 87.1 kg 87 kg 87 kg    Examination: On room air  General exam: Appears calm and comfortable  Respiratory system: Few rhonchi respiratory effort  normal. Cardiovascular system: Regular  gastrointestinal system: Abdomen is nondistended, soft and nontender. No organomegaly or masses felt. Normal bowel sounds heard. Central nervous system: Alert and oriented. No focal neurological deficits. Extremities: Trace edema bilaterally  Data Reviewed: I have personally reviewed following labs and imaging studies  CBC: Recent Labs  Lab 08/27/23 1316 08/27/23 2357 08/28/23 0639 08/29/23 0332  WBC 7.2 8.2 7.2 7.1  NEUTROABS 4.9  --   --   --   HGB 12.6* 10.9* 10.0* 8.6*  HCT 38.6* 32.9* 31.2* 26.2*  MCV 99.0 98.5 101.3* 101.2*  PLT 300 309 270 242   Basic Metabolic Panel: Recent Labs  Lab 08/27/23 1316 08/27/23 2357 08/28/23 0639 08/29/23 0332  NA 133* 138 140 139  K 3.7 3.5 4.0 3.5  CL 102 108 109 110  CO2 21* 18* 20* 20*  GLUCOSE 105* 103* 95 107*  BUN 21 40* 52* 45*  CREATININE 1.53* 1.65* 1.49* 1.48*  CALCIUM  8.6* 8.5* 8.4* 8.3*  MG  --   --  2.2  --   PHOS  --   --  3.4  --    GFR: Estimated Creatinine Clearance: 48.2 mL/min (A) (by C-G formula based on SCr of 1.48 mg/dL (H)). Liver Function Tests: Recent Labs  Lab 08/27/23 2357  AST 22  ALT 17  ALKPHOS 51  BILITOT 0.5  PROT 7.2  ALBUMIN 4.0   No results for input(s): LIPASE, AMYLASE in the last 168 hours. No results for input(s): AMMONIA in the last 168 hours. Coagulation Profile: Recent Labs  Lab 08/27/23 1316 08/27/23 2357  INR 1.0 1.1   Cardiac Enzymes: No results for input(s): CKTOTAL, CKMB, CKMBINDEX, TROPONINI in the last 168 hours. BNP (last 3 results) No results for input(s): PROBNP in the last 8760 hours. HbA1C: No results for input(s): HGBA1C in the last 72 hours. CBG: No results for input(s): GLUCAP in the last 168 hours. Lipid Profile: No results for input(s): CHOL, HDL, LDLCALC, TRIG, CHOLHDL, LDLDIRECT in the last 72 hours. Thyroid  Function Tests: No results for input(s): TSH, T4TOTAL, FREET4,  T3FREE, THYROIDAB in the last 72 hours. Anemia Panel: No results for input(s): VITAMINB12, FOLATE, FERRITIN, TIBC, IRON, RETICCTPCT in the last 72 hours. Sepsis Labs: No results for input(s): PROCALCITON, LATICACIDVEN in the last 168 hours.  Recent Results (from the past 240 hours)  MRSA Next Gen by PCR, Nasal     Status: None   Collection Time: 08/28/23  3:00 AM   Specimen: Nasal Mucosa; Nasal Swab  Result Value Ref Range Status   MRSA by PCR Next Gen NOT DETECTED NOT DETECTED Final    Comment: (NOTE) The GeneXpert MRSA Assay (FDA approved for NASAL specimens only), is one component of a comprehensive MRSA colonization surveillance program. It is not intended to diagnose MRSA infection nor to guide or monitor treatment for MRSA infections. Test performance is not FDA approved in patients less than 29 years old. Performed at [facility], [address], [place], [place] [address]          Radiology Studies: [facility] Chest Port 1 View Result Date: 08/29/2023 CLINICAL DATA:  Shortness of breath.  EXAM: PORTABLE CHEST 1 VIEW COMPARISON:  Chest CTA 08/27/2023 chest radiograph 08/27/2023 FINDINGS: Again noted is the mass in the left upper lobe/left suprahilar region. Hazy densities in the left lung most likely associated with some volume loss. Right lung remains clear. Heart size is stable and within normal limits. Trachea is midline. Negative for a pneumothorax. No acute bone abnormality. IMPRESSION: 1. Persistent mass in the left upper lobe. 2. Hazy densities in the left lung are most compatible with volume loss. Electronically Signed   By: Juliene Balder M.D.   On: 08/29/2023 08:28   MR BRAIN W WO CONTRAST Result Date: 08/28/2023 CLINICAL DATA:  Metastatic disease evaluation EXAM: MRI HEAD WITHOUT AND WITH CONTRAST TECHNIQUE: Multiplanar, multiecho pulse sequences of the brain and surrounding structures were obtained without and with intravenous contrast.  CONTRAST:  8mL GADAVIST  GADOBUTROL  1 MMOL/ML IV SOLN COMPARISON:  None Available. FINDINGS: MRI brain: There are multiple foci of encephalomalacia in the periventricular white matter of both hemispheres, left more than right with corpus callosal atrophy. There is no abnormal enhancement. No evidence of metastatic disease. There is no acute or chronic infarct. The ventricles are normal. No mass lesion. There are normal flow signals in the carotid arteries and basilar artery. No significant bone marrow signal abnormality. No significant abnormality in the paranasal sinuses or soft tissues. IMPRESSION: 1. There is no evidence of metastatic disease 2. There are multiple periventricular foci of encephalomalacia, left hemisphere more than the right, with atrophy of the corpus callosum. The pattern of disease is suggestive of chronic demyelination Electronically Signed   By: Nancyann Burns M.D.   On: 08/28/2023 11:54   CT Angio Chest PE W and/or Wo Contrast Result Date: 08/27/2023 CLINICAL DATA:  Hemoptysis.  Lung mass.  * Tracking Code: BO * EXAM: CT ANGIOGRAPHY CHEST WITH CONTRAST TECHNIQUE: Multidetector CT imaging of the chest was performed using the standard protocol during bolus administration of intravenous contrast. Multiplanar CT image reconstructions and MIPs were obtained to evaluate the vascular anatomy. RADIATION DOSE REDUCTION: This exam was performed according to the departmental dose-optimization program which includes automated exposure control, adjustment of the mA and/or kV according to patient size and/or use of iterative reconstruction technique. CONTRAST:  75mL OMNIPAQUE  IOHEXOL  350 MG/ML SOLN COMPARISON:  PET 07/26/2023 and CT chest 06/30/2023. FINDINGS: Cardiovascular: Negative for pulmonary embolus. Atherosclerotic calcification of the aorta. Heart is at the upper limits of normal in size to mildly enlarged. No pericardial effusion. Enlarged pulmonic trunk. Mediastinum/Nodes: Left hilar adenopathy  measures up to 1.5 cm. No pathologically enlarged mediastinal or axillary lymph nodes. Air in the esophagus can be seen with dysmotility. Lungs/Pleura: Left upper lobe mass measures approximately 5.0 x 5.6 cm, stable to minimally enlarged from 4.5 x 5.2 cm on 06/30/2023. Long border of contact with the adjacent mediastinum with extension to the left hilum. 10 mm ground-glass nodule in the posterolateral right lower lobe (12/77), unchanged. Newly obstructed superior segmental left lower lobe bronchus with mild postobstructive ground-glass nodularity in the left lower lobe. No pleural fluid. Airway is otherwise unremarkable. Upper Abdomen: Hepatic cysts. Cholecystectomy. Low-attenuation lesions in the kidneys. No specific follow-up necessary. Elevated left hemidiaphragm. Visualized portions of the liver, adrenal glands, kidneys, spleen, pancreas, stomach and bowel are otherwise grossly unremarkable. No upper abdominal adenopathy. Musculoskeletal: Degenerative changes in the spine. Review of the MIP images confirms the above findings. IMPRESSION: 1. Negative for pulmonary embolus. 2. Similar to minimally enlarged left upper lobe mass with left hilar adenopathy, compatible with  primary bronchogenic carcinoma. 3. New obstruction of the superior segmental left lower lobe bronchus with postobstructive ground-glass nodularity in the left lower lobe. 4. Stable 10 mm ground-glass right lower lobe nodule. Recommend attention on follow-up. 5.  Aortic atherosclerosis (ICD10-I70.0). 6. Electronically Signed   By: Newell Eke M.D.   On: 08/27/2023 17:27    Scheduled Meds:  allopurinol   150 mg Oral Daily   amLODipine   10 mg Oral Daily   busPIRone   30 mg Oral BID   Chlorhexidine  Gluconate Cloth  6 each Topical Daily   dextromethorphan   30 mg Oral BID   folic acid   1 mg Oral Daily   furosemide   40 mg Oral Daily   hydroxychloroquine   400 mg Oral QHS   pantoprazole  (PROTONIX ) IV  40 mg Intravenous Q12H   PARoxetine    37.5 mg Oral Daily   QUEtiapine   25 mg Oral QHS   rosuvastatin   40 mg Oral QHS   saccharomyces boulardii  250 mg Oral BID   tranexamic acid   500 mg Nebulization Q8H   traZODone   100 mg Oral QHS   Continuous Infusions:  azithromycin  Stopped (08/28/23 1059)   cefTRIAXone  (ROCEPHIN )  IV Stopped (08/28/23 1022)     LOS: 1 day     Almarie KANDICE Hoots, MD 08/29/2023, 9:20 AM

## 2023-08-30 ENCOUNTER — Other Ambulatory Visit: Payer: Self-pay

## 2023-08-30 DIAGNOSIS — R042 Hemoptysis: Secondary | ICD-10-CM | POA: Diagnosis not present

## 2023-08-30 DIAGNOSIS — C3412 Malignant neoplasm of upper lobe, left bronchus or lung: Secondary | ICD-10-CM

## 2023-08-30 LAB — HEMOGLOBIN AND HEMATOCRIT, BLOOD
HCT: 26.6 % — ABNORMAL LOW (ref 39.0–52.0)
Hemoglobin: 8.4 g/dL — ABNORMAL LOW (ref 13.0–17.0)

## 2023-08-30 LAB — BASIC METABOLIC PANEL WITH GFR
Anion gap: 9 (ref 5–15)
BUN: 26 mg/dL — ABNORMAL HIGH (ref 8–23)
CO2: 19 mmol/L — ABNORMAL LOW (ref 22–32)
Calcium: 8.1 mg/dL — ABNORMAL LOW (ref 8.9–10.3)
Chloride: 110 mmol/L (ref 98–111)
Creatinine, Ser: 1.43 mg/dL — ABNORMAL HIGH (ref 0.61–1.24)
GFR, Estimated: 52 mL/min — ABNORMAL LOW (ref >60)
Glucose, Bld: 110 mg/dL — ABNORMAL HIGH (ref 70–99)
Potassium: 3.4 mmol/L — ABNORMAL LOW (ref 3.5–5.1)
Sodium: 138 mmol/L (ref 135–145)

## 2023-08-30 LAB — CBC
HCT: 24.2 % — ABNORMAL LOW (ref 39.0–52.0)
Hemoglobin: 7.8 g/dL — ABNORMAL LOW (ref 13.0–17.0)
MCH: 32.8 pg (ref 26.0–34.0)
MCHC: 32.2 g/dL (ref 30.0–36.0)
MCV: 101.7 fL — ABNORMAL HIGH (ref 80.0–100.0)
Platelets: 236 K/uL (ref 150–400)
RBC: 2.38 MIL/uL — ABNORMAL LOW (ref 4.22–5.81)
RDW: 12.6 % (ref 11.5–15.5)
WBC: 6.5 K/uL (ref 4.0–10.5)
nRBC: 0 % (ref 0.0–0.2)

## 2023-08-30 MED ORDER — BENZONATATE 100 MG PO CAPS
200.0000 mg | ORAL_CAPSULE | Freq: Three times a day (TID) | ORAL | Status: DC
  Administered 2023-08-30 – 2023-09-02 (×11): 200 mg via ORAL
  Filled 2023-08-30 (×9): qty 2

## 2023-08-30 MED ORDER — IPRATROPIUM-ALBUTEROL 0.5-2.5 (3) MG/3ML IN SOLN: 3.0000 mL | RESPIRATORY_TRACT | Status: DC | PRN

## 2023-08-30 MED ORDER — MELATONIN 5 MG PO TABS
5.0000 mg | ORAL_TABLET | Freq: Every evening | ORAL | Status: DC | PRN
  Administered 2023-08-30 (×2): 5 mg via ORAL
  Filled 2023-08-30: qty 1

## 2023-08-30 MED ORDER — FLUTICASONE PROPIONATE 50 MCG/ACT NA SUSP
1.0000 | Freq: Every day | NASAL | Status: DC
  Administered 2023-08-30 – 2023-09-01 (×4): 1 via NASAL
  Filled 2023-08-30: qty 16

## 2023-08-30 MED ORDER — ALPRAZOLAM 0.25 MG PO TABS
0.2500 mg | ORAL_TABLET | Freq: Two times a day (BID) | ORAL | Status: DC | PRN
  Administered 2023-09-01 – 2023-09-02 (×2): 0.25 mg via ORAL
  Filled 2023-08-30 (×3): qty 1

## 2023-08-30 MED ORDER — ONDANSETRON HCL 4 MG/2ML IJ SOLN
4.0000 mg | Freq: Once | INTRAMUSCULAR | Status: AC
  Administered 2023-08-30 (×2): 4 mg via INTRAVENOUS
  Filled 2023-08-30: qty 2

## 2023-08-30 MED ORDER — LIDOCAINE HCL (PF) 2% IJ FOR NEBU
2.5000 mL | Freq: Four times a day (QID) | RESPIRATORY_TRACT | Status: DC | PRN
  Administered 2023-08-30: 5 mL via RESPIRATORY_TRACT
  Administered 2023-08-30: 2.5 mL via RESPIRATORY_TRACT
  Administered 2023-08-30: 5 mL via RESPIRATORY_TRACT
  Administered 2023-08-30: 2.5 mL via RESPIRATORY_TRACT
  Filled 2023-08-30 (×3): qty 5

## 2023-08-30 MED ORDER — VALACYCLOVIR HCL 500 MG PO TABS
1000.0000 mg | ORAL_TABLET | Freq: Every day | ORAL | Status: DC
  Administered 2023-08-30 – 2023-09-02 (×5): 1000 mg via ORAL
  Filled 2023-08-30 (×5): qty 2

## 2023-08-30 NOTE — Evaluation (Signed)
 Physical Therapy Evaluation Patient Details Name: [name] MRN: [id number] DOB: May 13, 1950 Today's Date: 08/30/2023  History of Present Illness  .Pt is a 73yo male presenting 08/28/23 with recurrent hemoptysis. PMH R-TKA  CHF, OA, depression, GERD, herpes, HLD, HTN, memory loss, hx of stroke, AKI, depression, tardive dyskinesia, SCLC, IBS, bronchoscopy 8/4  Clinical Impression  Pt admitted with above diagnosis.  Pt currently with functional limitations due to the deficits listed below (see PT Problem List). Pt will benefit from acute skilled PT to increase their independence and safety with mobility to allow discharge.     The patient  ambulated x 220' with light Hand hold assist, feeling slightly dizzy and unsteady.  Patient resides in Independent Living facility, uses no ambulatory device.  Patient should progress to return to ILF, recommend HHPT unless progress is limited and requires higher level of care.      If plan is discharge home, recommend the following: A little help with walking and/or transfers;A little help with bathing/dressing/bathroom;Help with stairs or ramp for entrance;Assist for transportation   Can travel by private vehicle    yes    Equipment Recommendations None recommended by PT  Recommendations for Other Services    OT   Functional Status Assessment Patient has had a recent decline in their functional status and demonstrates the ability to make significant improvements in function in a reasonable and predictable amount of time.     Precautions / Restrictions Precautions Precautions: Fall Restrictions Weight Bearing Restrictions Per Provider Order: No      Mobility  Bed Mobility Overal bed mobility: Needs Assistance Bed Mobility: Supine to Sit, Sit to Supine     Supine to sit: Supervision          Transfers Overall transfer level: Needs assistance   Transfers: Sit to/from Stand Sit to Stand: Contact guard assist                 Ambulation/Gait Ambulation/Gait assistance: Contact guard assist, Min assist Gait Distance (Feet): 220 Feet Assistive device: 1 person hand held assist Gait Pattern/deviations: Step-to pattern, Step-through pattern, Drifts right/left Gait velocity: decr     General Gait Details: required steady support with  unsteady gait.  Stairs            Wheelchair Mobility     Tilt Bed    Modified Rankin (Stroke Patients Only)       Balance Overall balance assessment: Needs assistance Sitting-balance support: No upper extremity supported, Feet supported Sitting balance-Leahy Scale: Good     Standing balance support: During functional activity, No upper extremity supported Standing balance-Leahy Scale: Poor                               Pertinent Vitals/Pain Pain Assessment Pain Assessment: No/denies pain    Home Living Family/patient expects to be discharged to:: Private residence Living Arrangements: Alone Available Help at Discharge: Family;Available PRN/intermittently Type of Home: Independent living facility Home Access: Level entry       Home Layout: One level Home Equipment: Standard Walker;Cane - single point;Rollator (4 wheels);Shower seat Additional Comments: living at [facility] ?Green, requiring more assistance    Prior Function Prior Level of Function : Independent/Modified Independent                     Extremity/Trunk Assessment   Upper Extremity Assessment Upper Extremity Assessment: Overall WFL for tasks assessed    Lower  Extremity Assessment Lower Extremity Assessment: Generalized weakness    Cervical / Trunk Assessment Cervical / Trunk Assessment: Normal  Communication   Communication Communication: No apparent difficulties    Cognition Arousal: Alert Behavior During Therapy: WFL for tasks assessed/performed   PT - Cognitive impairments: No apparent impairments                         Following  commands: Intact       Cueing Cueing Techniques: Verbal cues     General Comments      Exercises     Assessment/Plan    PT Assessment Patient needs continued PT services  PT Problem List Decreased strength;Decreased mobility;Decreased safety awareness;Decreased knowledge of precautions;Decreased activity tolerance;Decreased balance       PT Treatment Interventions DME instruction;Therapeutic activities;Gait training;Patient/family education;Therapeutic exercise;Functional mobility training    PT Goals (Current goals can be found in the Care Plan section)  Acute Rehab PT Goals Patient Stated Goal: go home PT Goal Formulation: With patient/family Time For Goal Achievement: 09/13/23 Potential to Achieve Goals: Good    Frequency Min 3X/week     Co-evaluation               AM-PAC PT 6 Clicks Mobility  Outcome Measure Help needed turning from your back to your side while in a flat bed without using bedrails?: None Help needed moving from lying on your back to sitting on the side of a flat bed without using bedrails?: None Help needed moving to and from a bed to a chair (including a wheelchair)?: A Little Help needed standing up from a chair using your arms (e.g., wheelchair or bedside chair)?: A Little Help needed to walk in hospital room?: A Little Help needed climbing 3-5 steps with a railing? : A Lot 6 Click Score: 19    End of Session Equipment Utilized During Treatment: Gait belt Activity Tolerance: Patient tolerated treatment well Patient left: in bed;with call bell/phone within reach;with family/visitor present;with bed alarm set Nurse Communication: Mobility status PT Visit Diagnosis: Unsteadiness on feet (R26.81);Difficulty in walking, not elsewhere classified (R26.2);Muscle weakness (generalized) (M62.81)    Time: 8594-8576 PT Time Calculation (min) (ACUTE ONLY): 18 min   Charges:   PT Evaluation $PT Eval Low Complexity: 1 Low   PT General  Charges $$ ACUTE PT VISIT: 1 Visit         Darice Potters PT Acute Rehabilitation Services Office [phone]   Potters Darice Norris 08/30/2023, 3:56 PM

## 2023-08-30 NOTE — Consult Note (Signed)
 Referring Provider: [facility] Primary Care Physician:  [name], MD Primary Gastroenterologist: Sampson  Reason for Consultation: Dark stools and anemia in setting of hemoptysis   HPI: [name] is a 73 y.o. male with past medical history of left upper lobe non-small cell lung cancer, history of MGUS, chronic kidney disease, anxiety and depression presented to the hospital with recurrent hemoptysis.  Patient with history of CVA and was on Plavix .  He is found to have drop in hemoglobin to 7.8 this morning and also having dark stools.  Baseline hemoglobin of 10 for last few months.  GI is consulted for further evaluation.  Normal INR.  Normal LFTs.  Patient seen and examined at bedside.  Family at bedside.  He is complaining of persistent cough with hemoptysis since his bronchoscopy several days ago.  Also complaining of upper abdominal soreness from consistent cough.  He denies any trouble swallowing or pain while swallowing.  Denies any nausea or vomiting.  Started having dark stool few days ago after episodes of coughing up blood.  Denies diarrhea or constipation.  Had endoscopy recently at [facility] according to daughter.  Past Medical History:  Diagnosis Date   (HFpEF) heart failure with preserved ejection fraction (HCC)    Allergy    Anxiety    Arthritis    Chronic kidney disease    Depression    Frequent urination    GERD (gastroesophageal reflux disease)    Gout    Headache    mirgaines in the past   Herpes    History of nuclear stress test    Myoview  05/2021: EF 77, normal perfusion, low risk   Hyperlipidemia    Hypertension    IBS (irritable bowel syndrome)    Memory loss    MGUS (monoclonal gammopathy of unknown significance)    Obesity    Pre-diabetes    Stroke [facility])    Urgency of urination     Past Surgical History:  Procedure Laterality Date   CHOLECYSTECTOMY N/A 12/17/2019   Procedure: LAPAROSCOPIC CHOLECYSTECTOMY WITH ICG INJECTION;   Surgeon: Ebbie Cough, MD;  Location: [facility] OR;  Service: General;  Laterality: N/A;   disckec     shouler arthroscopy Left    SPINAL FUSION     TONSILLECTOMY     TOTAL KNEE ARTHROPLASTY Right 11/15/2021   Procedure: TOTAL KNEE ARTHROPLASTY;  Surgeon: Melodi Lerner, MD;  Location: [facility] ORS;  Service: Orthopedics;  Laterality: Right;   VIDEO BRONCHOSCOPY WITH ENDOBRONCHIAL NAVIGATION Left 08/21/2023   Procedure: VIDEO BRONCHOSCOPY WITH ENDOBRONCHIAL NAVIGATION;  Surgeon: Shelah Lamar RAMAN, MD;  Location: [facility] ENDOSCOPY;  Service: Pulmonary;  Laterality: Left;   VIDEO BRONCHOSCOPY WITH ENDOBRONCHIAL ULTRASOUND  08/21/2023   Procedure: BRONCHOSCOPY, WITH EBUS;  Surgeon: Shelah Lamar RAMAN, MD;  Location: [facility] ENDOSCOPY;  Service: Pulmonary;;   VIDEO BRONCHOSCOPY WITH RADIAL ENDOBRONCHIAL ULTRASOUND  08/21/2023   Procedure: VIDEO BRONCHOSCOPY WITH RADIAL ENDOBRONCHIAL ULTRASOUND;  Surgeon: Shelah Lamar RAMAN, MD;  Location: [facility] ENDOSCOPY;  Service: Pulmonary;;   WISDOM TOOTH EXTRACTION      Prior to Admission medications   Medication Sig Start Date End Date Taking? Authorizing Provider  acetaminophen  (TYLENOL ) 500 MG tablet Take 1-2 tablets (500-1,000 mg total) by mouth every 6 (six) hours as needed for moderate pain. 11/18/21  Yes Edmisten, Kristie L, PA  allopurinol  (ZYLOPRIM ) 100 MG tablet Take 150 mg by mouth daily. 09/14/22  Yes [provider]  amLODipine  (NORVASC ) 10 MG tablet Take 1 tablet (10 mg total) by  mouth daily. 07/26/23  Yes Delford Maude BROCKS, MD  busPIRone  (BUSPAR ) 30 MG tablet Take 30 mg by mouth 2 (two) times daily.   Yes [provider]  cetirizine  (ZYRTEC  ALLERGY) 10 MG tablet Take 1 tablet (10 mg total) by mouth daily. Patient taking differently: Take 10 mg by mouth at bedtime. 04/25/22  Yes Christopher Savannah, PA-C  clindamycin (CLEOCIN) 300 MG capsule Take 300 mg by mouth 3 (three) times daily. 08/15/23  Yes [provider]  clopidogrel  (PLAVIX ) 75 MG tablet Take 1 tablet (75  mg total) by mouth daily. Okay to restart this medication on 08/22/2023. 08/21/23  Yes Byrum, Lamar RAMAN, MD  dextromethorphan  (DELSYM ) 30 MG/5ML liquid Take 5 mLs (30 mg total) by mouth 2 (two) times daily as needed for cough. 08/27/23  Yes Randol Simmonds, MD  folic acid  (FOLVITE ) 1 MG tablet Take 1 mg by mouth daily. 11/10/21  Yes [provider]  furosemide  (LASIX ) 40 MG tablet Take 1 tablet (40 mg total) by mouth daily. 07/13/23  Yes Chandrasekhar, Mahesh A, MD  hydrALAZINE  (APRESOLINE ) 50 MG tablet Take 1.5 tablets (75 mg total) by mouth 2 (two) times daily. 08/16/23  Yes Chandrasekhar, Mahesh A, MD  Hydroxychloroquine  Sulfate 400 MG TABS Take 400 mg by mouth at bedtime.   Yes [provider]  Lactobacillus (ACIDOPHILUS) CAPS capsule Take 1 capsule by mouth daily. Patient taking differently: Take 1 capsule by mouth in the morning. 07/03/23  Yes Vann, Jessica U, DO  lidocaine  (LIDODERM ) 5 % Place 1 patch onto the skin daily as needed (for pain- Remove & Discard patch within 12 hours or as directed by MD).   Yes [provider]  PARoxetine  (PAXIL -CR) 37.5 MG 24 hr tablet Take 1 tablet (37.5 mg total) by mouth at bedtime. 08/21/23  Yes Shelah Lamar RAMAN, MD  primidone  (MYSOLINE ) 250 MG tablet TAKE 1 TABLET BY MOUTH TWICE A DAY 07/17/23  Yes Rosemarie Eather RAMAN, MD  QUEtiapine  (SEROQUEL ) 25 MG tablet Take 25 mg by mouth at bedtime.   Yes [provider]  rosuvastatin  (CRESTOR ) 40 MG tablet Take 40 mg by mouth daily.   Yes [provider]  traZODone  (DESYREL ) 100 MG tablet Take 100 mg by mouth at bedtime.   Yes [provider]  valACYclovir  (VALTREX ) 1000 MG tablet Take 1,000 mg by mouth daily.   Yes [provider]  Vitamin D , Ergocalciferol , (DRISDOL ) 1.25 MG (50000 UNIT) CAPS capsule Take 50,000 Units by mouth every Saturday. 09/17/20  Yes [provider]  benzonatate  (TESSALON ) 200 MG capsule Take 1 capsule (200 mg total) by mouth 3 (three) times  daily as needed for cough. 08/23/23   Byrum, Robert S, MD    Scheduled Meds:  allopurinol   150 mg Oral Daily   amLODipine   10 mg Oral Daily   benzonatate   200 mg Oral TID   busPIRone   30 mg Oral BID   Chlorhexidine  Gluconate Cloth  6 each Topical Daily   dextromethorphan   30 mg Oral BID   fluticasone   1 spray Each Nare Daily   folic acid   1 mg Oral Daily   furosemide   40 mg Oral Daily   hydroxychloroquine   400 mg Oral QHS   pantoprazole  (PROTONIX ) IV  40 mg Intravenous Q12H   PARoxetine   37.5 mg Oral Daily   QUEtiapine   25 mg Oral QHS   rosuvastatin   40 mg Oral QHS   saccharomyces boulardii  250 mg Oral BID   traZODone   100 mg  Oral QHS   valACYclovir   1,000 mg Oral Daily   Continuous Infusions:  azithromycin  Stopped (08/29/23 1317)   cefTRIAXone  (ROCEPHIN )  IV Stopped (08/29/23 1106)   PRN Meds:.docusate sodium , HYDROcodone  bit-homatropine, ipratropium-albuterol , mouth rinse, polyethylene glycol  Allergies as of 08/27/2023 - Review Complete 08/27/2023  Allergen Reaction Noted   Divalproex sodium Hives 03/31/2011   Lamotrigine Diarrhea and Other (See Comments) 02/23/2011   Valproic acid Hives and Other (See Comments) 03/31/2011   Amitriptyline Other (See Comments) 11/15/2021   Aspirin  Nausea And Vomiting and Other (See Comments) 10/17/2010   Hydrocodone  Nausea And Vomiting 08/18/2023   Lisinopril Cough 09/21/2012   Lithium Diarrhea and Other (See Comments) 02/23/2011   Morphine  Other (See Comments) 11/15/2021   Nsaids Other (See Comments) 04/19/2022   Oxycodone Nausea And Vomiting 10/17/2010   Oxycodone-acetaminophen  Nausea And Vomiting and Other (See Comments) 02/22/2017   Pork allergy Nausea Only 11/15/2021    Family History  Problem Relation Age of Onset   Hypertension Mother    Hypertension Father    Hypertension Sister    Hypertension Brother    Cancer Maternal Aunt    Cancer - Ovarian Maternal Grandmother    Diabetes Mellitus I Paternal Grandmother     Hypertension Paternal Grandfather    Cancer Maternal Aunt     Social History   Socioeconomic History   Marital status: Divorced    Spouse name: Not on file   Number of children: 1   Years of education: Masters   Highest education level: Not on file  Occupational History   Occupation: retired    Associate Professor: RETIRED  Tobacco Use   Smoking status: Former    Current packs/day: 0.00    Types: Cigarettes    Quit date: 1990    Years since quitting: 35.6   Smokeless tobacco: Never  Vaping Use   Vaping status: Never Used  Substance and Sexual Activity   Alcohol  use: No    Alcohol /week: 1.0 standard drink of alcohol     Types: 1 Cans of beer per week   Drug use: No   Sexual activity: Never  Other Topics Concern   Not on file  Social History Narrative   Patient lives in Independent living @ [facility]   Patient is right-handed.   Patient does not drink any caffeine.   Social Drivers of Corporate investment banker Strain: Not on file  Food Insecurity: No Food Insecurity (08/28/2023)   Hunger Vital Sign    Worried About Running Out of Food in the Last Year: Never true    Ran Out of Food in the Last Year: Never true  Transportation Needs: No Transportation Needs (08/28/2023)   PRAPARE - Administrator, Civil Service (Medical): No    Lack of Transportation (Non-Medical): No  Physical Activity: Not on file  Stress: Not on file  Social Connections: Moderately Isolated (08/28/2023)   Social Connection and Isolation Panel    Frequency of Communication with Friends and Family: More than three times a week    Frequency of Social Gatherings with Friends and Family: Twice a week    Attends Religious Services: Never    Database administrator or Organizations: Yes    Attends Engineer, structural: More than 4 times per year    Marital Status: Divorced  Intimate Partner Violence: Not At Risk (08/28/2023)   Humiliation, Afraid, Rape, and Kick questionnaire    Fear of  Current or Ex-Partner: No    Emotionally  Abused: No    Physically Abused: No    Sexually Abused: No    Review of Systems: All negative except as stated above in HPI.  Physical Exam: Vital signs: Vitals:   08/30/23 0619 08/30/23 0700  BP: (!) 122/59   Pulse: 81   Resp: 14   Temp:  97.8 F (36.6 C)  SpO2: 93%    Last BM Date : 08/29/23 General:   Alert,  Well-developed, well-nourished, pleasant and cooperative in NAD Normocephalic, atraumatic Extraocular movement intact Lungs: No visible respiratory distress Heart:  Regular rate and rhythm; no murmurs, clicks, rubs,  or gallops. Abdomen: Soft, nontender, nondistended, bowel sound present, no peritoneal signs Mood and affect normal Alert and orient x 3 Rectal:  Deferred  GI:  Lab Results: Recent Labs    08/28/23 0639 08/29/23 0332 08/30/23 0714  WBC 7.2 7.1 6.5  HGB 10.0* 8.6* 7.8*  HCT 31.2* 26.2* 24.2*  PLT 270 242 236   BMET Recent Labs    08/28/23 0639 08/29/23 0332 08/30/23 0714  NA 140 139 138  K 4.0 3.5 3.4*  CL 109 110 110  CO2 20* 20* 19*  GLUCOSE 95 107* 110*  BUN 52* 45* 26*  CREATININE 1.49* 1.48* 1.43*  CALCIUM  8.4* 8.3* 8.1*   LFT Recent Labs    08/27/23 2357  PROT 7.2  ALBUMIN 4.0  AST 22  ALT 17  ALKPHOS 51  BILITOT 0.5   PT/INR Recent Labs    08/27/23 1316 08/27/23 2357  LABPROT 14.0 15.3*  INR 1.0 1.1     Studies/Results: DG Chest Port 1 View Result Date: 08/29/2023 CLINICAL DATA:  Shortness of breath. EXAM: PORTABLE CHEST 1 VIEW COMPARISON:  Chest CTA 08/27/2023 chest radiograph 08/27/2023 FINDINGS: Again noted is the mass in the left upper lobe/left suprahilar region. Hazy densities in the left lung most likely associated with some volume loss. Right lung remains clear. Heart size is stable and within normal limits. Trachea is midline. Negative for a pneumothorax. No acute bone abnormality. IMPRESSION: 1. Persistent mass in the left upper lobe. 2. Hazy densities in the  left lung are most compatible with volume loss. Electronically Signed   By: Juliene Balder M.D.   On: 08/29/2023 08:28   MR BRAIN W WO CONTRAST Result Date: 08/28/2023 CLINICAL DATA:  Metastatic disease evaluation EXAM: MRI HEAD WITHOUT AND WITH CONTRAST TECHNIQUE: Multiplanar, multiecho pulse sequences of the brain and surrounding structures were obtained without and with intravenous contrast. CONTRAST:  8mL GADAVIST  GADOBUTROL  1 MMOL/ML IV SOLN COMPARISON:  None Available. FINDINGS: MRI brain: There are multiple foci of encephalomalacia in the periventricular white matter of both hemispheres, left more than right with corpus callosal atrophy. There is no abnormal enhancement. No evidence of metastatic disease. There is no acute or chronic infarct. The ventricles are normal. No mass lesion. There are normal flow signals in the carotid arteries and basilar artery. No significant bone marrow signal abnormality. No significant abnormality in the paranasal sinuses or soft tissues. IMPRESSION: 1. There is no evidence of metastatic disease 2. There are multiple periventricular foci of encephalomalacia, left hemisphere more than the right, with atrophy of the corpus callosum. The pattern of disease is suggestive of chronic demyelination Electronically Signed   By: Nancyann Burns M.D.   On: 08/28/2023 11:54    Impression/Plan: -Dark stool in setting of hemoptysis in a patient with recently diagnosed lung cancer. - Acute on chronic anemia - Chronic GERD - History of CVA.  Was on  Plavix .  Currently on hold.  Recommendations ---------------------- - Most likely explanation for patient's dark stool is from his hemoptysis.  According to patient, he is swallowing some of his cough material which may explain his dark stool.  He does not want any endoscopic intervention at this time. - Recommend to keep him on Protonix  IV while in the hospital and Protonix  40 mg once a day after discharge. - He will follow-up with his  primary GI after discharge.  GI will sign off.  Call us  back if needed. - Management options discussed with family as well as hospitalist.    LOS: 2 days   Layla Lah  MD, FACP 08/30/2023, 9:52 AM  Contact #  [phone]

## 2023-08-30 NOTE — Progress Notes (Signed)
 PROGRESS NOTE    [name]  FMW:[id number] DOB: January 17, 1951 DOA: 08/27/2023 PCP: [name], MD  Brief Narrative: This patient was admitted on 08/28/2023 and TR[facility] pickup 08/29/2023.  Essentially this 73 year old male with MGUS, CHF, status post bronc 08/22/2023 positive for non-small cell carcinoma admitted with recurrent hemoptysis in spite of trying multiple antitussive  antitussives at home.  He came to the ER on the 11th with hemoptysis and cough seen by pulmonary stabilized and was discharged home.  He came back the same night with episode of hemoptysis and cough and was admitted to the hospital.  Seen by oncology and radiation oncology at this time.  Plan is for simulation today for radiation to start next Wednesday. 08/29/2023 patient's daughter reports that he had a rough night as he was not vigorously coughing until he got the Hycodan.  After he got the Hycodan patient slept.   Assessment & Plan:   Principal Problem:   Hemoptysis   #1 persistent cough and hemoptysis secondary to newly diagnosed left upper lobe non-small cell carcinoma status post bronc and biopsy on 08/22/2023. Holding Plavix  Continuing Delsym , Hycodan cough syrup, Mucinex  TXA nebs (TXA nebs done) Chest x-ray from today shows persistent mass in the left upper lobe and hazy densities in the left lung most compatible with volume loss. MRI brainThere is no evidence of metastatic disease.There are multiple periventricular foci of encephalomalacia, left hemisphere more than the right, with atrophy of the corpus callosum. The pattern of disease is suggestive of chronic demyelination CT chest- Negative for pulmonary embolus. Similar to minimally enlarged left upper lobe mass with left hilar adenopathy, compatible with primary bronchogenic carcinoma New obstruction of the superior segmental left lower lobe bronchus with postobstructive ground-glass nodularity in the left lower lobe.  Stable 10 mm ground-glass right lower  lobe nodule. Recommend attention on follow-up. He was started on Rocephin  and azithromycin  for CAP coverage on 08/28/2023 Will follow-up with Dr. Sherrod on discharge. Seen by radiation oncology will start radiation next Wednesday. Seen by Dr. Loretha during this hospital stay.   #2 hypertension blood pressure stable on Norvasc  and Lasix  prior to admission  #3 CKD stage III A stable  creatinine 1.43  #4 history of chronic anemia-hb 7.8 from 8.6 from 10 from 10.9 from 12.6 from 13.2 (received ivf too) GI was consulted for black stools and anemia however patient does not want any endoscopic intervention at this time.  Continue Protonix  on discharge.  Follow-up with primary GI on discharge at [facility].    #5 anxiety/depression/insomnia on Paxil  BuSpar  Seroquel  and trazodone   #6 history of rheumatoid arthritis on hydroxychloroquine   #7 history of stroke on Plavix  currently on hold due to hemoptysis.  #8 history of gout on allopurinol   #9 herpes prophylaxis?  On acyclovir  #10 MGUS followed by oncology on surveillance   Estimated body mass index is 29.87 kg/m as calculated from the following:   Height as of 08/29/23: 5' 7 (1.702 m).   Weight as of this encounter: 86.5 kg.  DVT prophylaxis: scd Code Status:full Family Communication: daughter Disposition Plan:  Status is: Inpatient Remains inpatient appropriate because: hemoptysis cough   Consultants:  PCCM Radiation oncology Medical oncology GI  Procedures:none Antimicrobials Rocephin  and azithromycin   Subjective: He is resting in bed discussed questions and concerns with the daughter he is having black stools and abdominal pain GI consulted however he wants to wait and not have endoscopy at this time Plavix  still on hold hemoglobin slowly trending down  Objective: Vitals:   08/30/23 0700 08/30/23 0800 08/30/23 0950 08/30/23 1000  BP:  123/64 126/64 (!) 127/55  Pulse:  77  84  Resp:  11  17  Temp: 97.8 F  (36.6 C)     TempSrc: Oral     SpO2:  94%  100%  Weight:      Height:        Intake/Output Summary (Last 24 hours) at 08/30/2023 1140 Last data filed at 08/30/2023 1108 Gross per 24 hour  Intake 1050.03 ml  Output 1320 ml  Net -269.97 ml   Filed Weights   08/28/23 0245 08/29/23 0400 08/30/23 0502  Weight: 87 kg 87 kg 86.5 kg    Examination: On room air  General exam: Appears in no acute distress  respiratory system: Few rhonchi respiratory effort normal. Cardiovascular system: Regular  gastrointestinal system: Abdomen is nondistended, soft and epigastric tender. No organomegaly or masses felt. Normal bowel sounds heard. Central nervous system: Alert and oriented. No focal neurological deficits. Extremities: Trace edema bilaterally  Data Reviewed: I have personally reviewed following labs and imaging studies  CBC: Recent Labs  Lab 08/27/23 1316 08/27/23 2357 08/28/23 0639 08/29/23 0332 08/30/23 0714  WBC 7.2 8.2 7.2 7.1 6.5  NEUTROABS 4.9  --   --   --   --   HGB 12.6* 10.9* 10.0* 8.6* 7.8*  HCT 38.6* 32.9* 31.2* 26.2* 24.2*  MCV 99.0 98.5 101.3* 101.2* 101.7*  PLT 300 309 270 242 236   Basic Metabolic Panel: Recent Labs  Lab 08/27/23 1316 08/27/23 2357 08/28/23 0639 08/29/23 0332 08/30/23 0714  NA 133* 138 140 139 138  K 3.7 3.5 4.0 3.5 3.4*  CL 102 108 109 110 110  CO2 21* 18* 20* 20* 19*  GLUCOSE 105* 103* 95 107* 110*  BUN 21 40* 52* 45* 26*  CREATININE 1.53* 1.65* 1.49* 1.48* 1.43*  CALCIUM  8.6* 8.5* 8.4* 8.3* 8.1*  MG  --   --  2.2  --   --   PHOS  --   --  3.4  --   --    GFR: Estimated Creatinine Clearance: 49.8 mL/min (A) (by C-G formula based on SCr of 1.43 mg/dL (H)). Liver Function Tests: Recent Labs  Lab 08/27/23 2357  AST 22  ALT 17  ALKPHOS 51  BILITOT 0.5  PROT 7.2  ALBUMIN 4.0   No results for input(s): LIPASE, AMYLASE in the last 168 hours. No results for input(s): AMMONIA in the last 168 hours. Coagulation  Profile: Recent Labs  Lab 08/27/23 1316 08/27/23 2357  INR 1.0 1.1   Cardiac Enzymes: No results for input(s): CKTOTAL, CKMB, CKMBINDEX, TROPONINI in the last 168 hours. BNP (last 3 results) No results for input(s): PROBNP in the last 8760 hours. HbA1C: No results for input(s): HGBA1C in the last 72 hours. CBG: No results for input(s): GLUCAP in the last 168 hours. Lipid Profile: No results for input(s): CHOL, HDL, LDLCALC, TRIG, CHOLHDL, LDLDIRECT in the last 72 hours. Thyroid  Function Tests: No results for input(s): TSH, T4TOTAL, FREET4, T3FREE, THYROIDAB in the last 72 hours. Anemia Panel: No results for input(s): VITAMINB12, FOLATE, FERRITIN, TIBC, IRON, RETICCTPCT in the last 72 hours. Sepsis Labs: No results for input(s): PROCALCITON, LATICACIDVEN in the last 168 hours.  Recent Results (from the past 240 hours)  MRSA Next Gen by PCR, Nasal     Status: None   Collection Time: 08/28/23  3:00 AM   Specimen: Nasal Mucosa; Nasal Swab  Result Value  Ref Range Status   MRSA by PCR Next Gen NOT DETECTED NOT DETECTED Final    Comment: (NOTE) The GeneXpert MRSA Assay (FDA approved for NASAL specimens only), is one component of a comprehensive MRSA colonization surveillance program. It is not intended to diagnose MRSA infection nor to guide or monitor treatment for MRSA infections. Test performance is not FDA approved in patients less than 14 years old. Performed at [facility], [address], [place], [place] [address]          Radiology Studies: [facility] Chest Port 1 View Result Date: 08/29/2023 CLINICAL DATA:  Shortness of breath. EXAM: PORTABLE CHEST 1 VIEW COMPARISON:  Chest CTA 08/27/2023 chest radiograph 08/27/2023 FINDINGS: Again noted is the mass in the left upper lobe/left suprahilar region. Hazy densities in the left lung most likely associated with some volume loss. Right lung remains clear. Heart  size is stable and within normal limits. Trachea is midline. Negative for a pneumothorax. No acute bone abnormality. IMPRESSION: 1. Persistent mass in the left upper lobe. 2. Hazy densities in the left lung are most compatible with volume loss. Electronically Signed   By: Juliene Balder M.D.   On: 08/29/2023 08:28    Scheduled Meds:  allopurinol   150 mg Oral Daily   amLODipine   10 mg Oral Daily   benzonatate   200 mg Oral TID   busPIRone   30 mg Oral BID   Chlorhexidine  Gluconate Cloth  6 each Topical Daily   dextromethorphan   30 mg Oral BID   fluticasone   1 spray Each Nare Daily   folic acid   1 mg Oral Daily   furosemide   40 mg Oral Daily   hydroxychloroquine   400 mg Oral QHS   pantoprazole  (PROTONIX ) IV  40 mg Intravenous Q12H   PARoxetine   37.5 mg Oral Daily   QUEtiapine   25 mg Oral QHS   rosuvastatin   40 mg Oral QHS   saccharomyces boulardii  250 mg Oral BID   traZODone   100 mg Oral QHS   valACYclovir   1,000 mg Oral Daily   Continuous Infusions:  azithromycin  Stopped (08/29/23 1317)   cefTRIAXone  (ROCEPHIN )  IV Stopped (08/30/23 1119)     LOS: 2 days     Almarie KANDICE Hoots, MD 08/30/2023, 11:40 AM

## 2023-08-30 NOTE — Plan of Care (Signed)
  Problem: Clinical Measurements: Goal: Diagnostic test results will improve Outcome: Not Progressing   Problem: Activity: Goal: Risk for activity intolerance will decrease Outcome: Not Progressing   Problem: Nutrition: Goal: Adequate nutrition will be maintained Outcome: Not Progressing   Problem: Coping: Goal: Level of anxiety will decrease Outcome: Not Progressing

## 2023-08-30 NOTE — Progress Notes (Signed)
 NAME:  [name], MRN:  [id number], DOB:  03/17/1950, LOS: 2 ADMISSION DATE:  08/27/2023, CONSULTATION DATE:  08/28/23  REFERRING MD:  ER , CHIEF COMPLAINT:  Hemoptysis   History of Present Illness:   73yoM patient with MGUS, CHF, recent LUL navigational bronch with biopsy on 08/21/13 positive for NSCLC / adeno, TTF-1+ and p40 negative, here for recurrent hemoptysis, tried delsym  at home. He was in the ER earlier today and seen by Pulmonary, patient stabilized and was discharged home. At home he was eating dinner and had another episode of hemoptysis thus came back to the ER. Patient seen in ER room 10, we reviewed the bronch results and I advised him and his daughter that we will need an Oncology evaluation in regards to prognostics while he is admitted.   Pertinent  Medical History   Past Medical History:  Diagnosis Date   (HFpEF) heart failure with preserved ejection fraction (HCC)    Allergy    Anxiety    Arthritis    Chronic kidney disease    Depression    Frequent urination    GERD (gastroesophageal reflux disease)    Gout    Headache    mirgaines in the past   Herpes    History of nuclear stress test    Myoview  05/2021: EF 77, normal perfusion, low risk   Hyperlipidemia    Hypertension    IBS (irritable bowel syndrome)    Memory loss    MGUS (monoclonal gammopathy of unknown significance)    Obesity    Pre-diabetes    Stroke [facility])    Urgency of urination      Significant Hospital Events: Including procedures, antibiotic start and stop dates in addition to other pertinent events   08/28/23 - admit to ICU for hemoptysis  Interim History / Subjective:   Continues to have coughing fits, frequency seems to be decreasing along with severity. Hemoptysis is present, but improving  Daughter expressed concerns with discharge planning and caring for her father at home.   Objective    Blood pressure (!) 122/59, pulse 81, temperature 98.1 F (36.7 C), temperature  source Oral, resp. rate 14, height 5' 7 (1.702 m), weight 86.5 kg, SpO2 93%.        Intake/Output Summary (Last 24 hours) at 08/30/2023 9167 Last data filed at 08/30/2023 0530 Gross per 24 hour  Intake 1050.03 ml  Output 1320 ml  Net -269.97 ml   Filed Weights   08/28/23 0245 08/29/23 0400 08/30/23 0502  Weight: 87 kg 87 kg 86.5 kg   Examination: General: elderly male, no distress HENT: NCAT, moist mucous membranes  Lungs: even unlabored on RA  Cardiovascular: rrr, no murmurs Abdomen: soft  Extremities: no obvious joint deformity  GU defer  Skin c/d/w    Assessment and Plan    LUL NSCLC - new dx   Hemoptysis, mild  - biopsy positive for NSCLC/adeno, TTF-1+ , p40 negative. -had a dry cough following bronch/cryobx  a week ago, progressed to some hemoptysis which initially abated and then recurred. ED 8/10 then dc home, back 8/11 with the same  P -Completed 2 days of scheduled TXA nebs  - Duonebs q4hrs PRN -hold plavix  -cont antitussives: hycodan cough syrup, tessalon  perles, delsym  - Will add lidocaine  nebs next if continues to have issues -Continue CAP coverage -at family request also adding probiotics 2/2 CAP coverage  -onc, rad onc have been consulted. Plan to start radiation 8/20  Dark Stools - Recommend  GI consult due to declinging hemoglobin - started PPI therapy on 8/11 - continue to monitor - trend CBC  CKD 3a P -monitor  HTN HLD -amlodipine , lasix  and statin  Anemia -Hgb trending down - type and screen from 8/11 - transfuse for hemoglobin 7 or less - GI consult placed  Depression Anxiety Sleep disturbance  -cont home paxil , buspar , seroquel , trazodone    RA -cont home hydroxychloroquine    Hx stroke -holding plavix     Best Practice (right click and Reselect all SmartList Selections daily)   Diet/type: Regular consistency (see orders) DVT prophylaxis SCD Pressure ulcer(s): N/A GI prophylaxis: H2B Lines: N/A Foley:  N/A Code  Status:  full code Last date of multidisciplinary goals of care discussion [08/30/23] -- talked w pt and daughter and patient  Labs   CBC: Recent Labs  Lab 08/27/23 1316 08/27/23 2357 08/28/23 0639 08/29/23 0332 08/30/23 0714  WBC 7.2 8.2 7.2 7.1 6.5  NEUTROABS 4.9  --   --   --   --   HGB 12.6* 10.9* 10.0* 8.6* 7.8*  HCT 38.6* 32.9* 31.2* 26.2* 24.2*  MCV 99.0 98.5 101.3* 101.2* 101.7*  PLT 300 309 270 242 236    Basic Metabolic Panel: Recent Labs  Lab 08/27/23 1316 08/27/23 2357 08/28/23 0639 08/29/23 0332 08/30/23 0714  NA 133* 138 140 139 138  K 3.7 3.5 4.0 3.5 3.4*  CL 102 108 109 110 110  CO2 21* 18* 20* 20* 19*  GLUCOSE 105* 103* 95 107* 110*  BUN 21 40* 52* 45* 26*  CREATININE 1.53* 1.65* 1.49* 1.48* 1.43*  CALCIUM  8.6* 8.5* 8.4* 8.3* 8.1*  MG  --   --  2.2  --   --   PHOS  --   --  3.4  --   --    GFR: Estimated Creatinine Clearance: 49.8 mL/min (A) (by C-G formula based on SCr of 1.43 mg/dL (H)). Recent Labs  Lab 08/27/23 2357 08/28/23 0639 08/29/23 0332 08/30/23 0714  WBC 8.2 7.2 7.1 6.5    Liver Function Tests: Recent Labs  Lab 08/27/23 2357  AST 22  ALT 17  ALKPHOS 51  BILITOT 0.5  PROT 7.2  ALBUMIN 4.0   No results for input(s): LIPASE, AMYLASE in the last 168 hours. No results for input(s): AMMONIA in the last 168 hours.  ABG    Component Value Date/Time   TCO2 24 11/27/2016 2004     Coagulation Profile: Recent Labs  Lab 08/27/23 1316 08/27/23 2357  INR 1.0 1.1    Cardiac Enzymes: No results for input(s): CKTOTAL, CKMB, CKMBINDEX, TROPONINI in the last 168 hours.  HbA1C: Hgb A1c MFr Bld  Date/Time Value Ref Range Status  07/01/2023 05:40 AM 5.2 4.8 - 5.6 % Final    Comment:    (NOTE) Diagnosis of Diabetes The following HbA1c ranges recommended by the American Diabetes Association (ADA) may be used as an aid in the diagnosis of diabetes mellitus.  Hemoglobin             Suggested A1C NGSP%               Diagnosis  <5.7                   Non Diabetic  5.7-6.4                Pre-Diabetic  >6.4                   Diabetic  <7.0  Glycemic control for                       adults with diabetes.    06/04/2020 01:20 PM 5.9 (H) 4.8 - 5.6 % Final    Comment:             Prediabetes: 5.7 - 6.4          Diabetes: >6.4          Glycemic control for adults with diabetes: <7.0     CBG: No results for input(s): GLUCAP in the last 168 hours.   Dorn Chill, MD [facility] Office: [phone]   See Amion for personal pager PCCM on call pager [phone] until 7pm. Please call Elink 7p-7a. [phone]

## 2023-08-31 ENCOUNTER — Other Ambulatory Visit: Payer: Self-pay

## 2023-08-31 DIAGNOSIS — N1831 Chronic kidney disease, stage 3a: Secondary | ICD-10-CM | POA: Diagnosis not present

## 2023-08-31 DIAGNOSIS — C3490 Malignant neoplasm of unspecified part of unspecified bronchus or lung: Secondary | ICD-10-CM

## 2023-08-31 DIAGNOSIS — D649 Anemia, unspecified: Secondary | ICD-10-CM | POA: Diagnosis not present

## 2023-08-31 DIAGNOSIS — D539 Nutritional anemia, unspecified: Secondary | ICD-10-CM

## 2023-08-31 DIAGNOSIS — R042 Hemoptysis: Secondary | ICD-10-CM | POA: Diagnosis not present

## 2023-08-31 LAB — BASIC METABOLIC PANEL WITH GFR
Anion gap: 7 (ref 5–15)
BUN: 18 mg/dL (ref 8–23)
CO2: 22 mmol/L (ref 22–32)
Calcium: 8.3 mg/dL — ABNORMAL LOW (ref 8.9–10.3)
Chloride: 112 mmol/L — ABNORMAL HIGH (ref 98–111)
Creatinine, Ser: 1.42 mg/dL — ABNORMAL HIGH (ref 0.61–1.24)
GFR, Estimated: 53 mL/min — ABNORMAL LOW (ref >60)
Glucose, Bld: 111 mg/dL — ABNORMAL HIGH (ref 70–99)
Potassium: 3.7 mmol/L (ref 3.5–5.1)
Sodium: 141 mmol/L (ref 135–145)

## 2023-08-31 LAB — HEMOGLOBIN AND HEMATOCRIT, BLOOD
HCT: 22.2 % — ABNORMAL LOW (ref 39.0–52.0)
Hemoglobin: 7.5 g/dL — ABNORMAL LOW (ref 13.0–17.0)

## 2023-08-31 LAB — VITAMIN B12: Vitamin B-12: 343 pg/mL (ref 180–914)

## 2023-08-31 LAB — FOLATE: Folate: 31.4 ng/mL (ref >5.9)

## 2023-08-31 NOTE — Progress Notes (Signed)
 The proposed treatment discussed in conference is for discussion purpose only and is not a binding recommendation.  The patients have not been physically examined, or presented with their treatment options.  Therefore, final treatment plans cannot be decided.

## 2023-08-31 NOTE — Evaluation (Signed)
 Occupational Therapy Evaluation Patient Details Name: [name] MRN: [id number] DOB: [birth year] Today's Date: 08/31/2023   History of Present Illness   .Pt is a 73yo male presenting 08/28/23 with recurrent hemoptysis. PMH R-TKA  CHF, OA, depression, GERD, herpes, HLD, HTN, memory loss, hx of stroke, AKI, depression, tardive dyskinesia, SCLC, IBS, bronchoscopy 8/4     Clinical Impressions PTA, patient lives at [facility]  and was indep incl amb and driving but does receive meals and housekeeping/laundry support from staff and was highly active in activities within the facility. Currently, patient presents with deficits outlined below (see OT Problem List for details) most significantly  decreased activity tolerance and higher level balance for BADL's and functional mobility. Will benefit from further pacing and ECT education and training as well as HHOT services upon d/c acute setting.  Patient requires continued Acute care hospital level OT services to progress safety and functional performance and allow for discharge.       If plan is discharge home, recommend the following:   A little help with walking and/or transfers;A little help with bathing/dressing/bathroom;Assistance with cooking/housework;Assist for transportation;Help with stairs or ramp for entrance     Functional Status Assessment   Patient has had a recent decline in their functional status and demonstrates the ability to make significant improvements in function in a reasonable and predictable amount of time.     Equipment Recommendations   None recommended by OT     Recommendations for Other Services         Precautions/Restrictions   Precautions Precautions: Fall Restrictions Weight Bearing Restrictions Per Provider Order: No     Mobility Bed Mobility Overal bed mobility: Needs Assistance Bed Mobility: Supine to Sit, Sit to Supine     Supine to sit: Supervision           Transfers Overall transfer level: Needs assistance   Transfers: Sit to/from Stand Sit to Stand: Contact guard assist                  Balance Overall balance assessment: Needs assistance Sitting-balance support: No upper extremity supported, Feet supported Sitting balance-Leahy Scale: Good     Standing balance support: During functional activity, No upper extremity supported Standing balance-Leahy Scale: Fair                             ADL either performed or assessed with clinical judgement   ADL Overall ADL's : Needs assistance/impaired Eating/Feeding: Set up;Sitting   Grooming: Wash/dry hands;Wash/dry face;Oral care;Set up;Sitting   Upper Body Bathing: Set up;Sitting   Lower Body Bathing: Supervison/ safety;Sit to/from stand   Upper Body Dressing : Set up;Sitting   Lower Body Dressing: Supervision/safety;Sit to/from stand   Toilet Transfer: Supervision/safety;Ambulation;Regular Toilet   Toileting- Clothing Manipulation and Hygiene: Modified independent;Sit to/from stand       Functional mobility during ADLs: Supervision/safety General ADL Comments: no SOB or LOB noted     Vision Baseline Vision/History: 0 No visual deficits;1 Wears glasses Ability to See in Adequate Light: 0 Adequate Patient Visual Report: No change from baseline Vision Assessment?: No apparent visual deficits;Wears glasses for reading            Pertinent Vitals/Pain Pain Assessment Pain Assessment: No/denies pain     Extremity/Trunk Assessment Upper Extremity Assessment Upper Extremity Assessment: Overall WFL for tasks assessed;Right hand dominant   Lower Extremity Assessment Lower Extremity Assessment: Defer to PT evaluation   Cervical /  Trunk Assessment Cervical / Trunk Assessment: Normal   Communication Communication Communication: No apparent difficulties   Cognition Arousal: Alert Behavior During Therapy: WFL for tasks assessed/performed                                  Following commands: Intact       Cueing  General Comments   Cueing Techniques: Verbal cues  no SOB noted, HR 70 SpO2 on RA 100%           Home Living Family/patient expects to be discharged to:: Private residence Living Arrangements: Alone Available Help at Discharge: Family;Available PRN/intermittently Type of Home: Independent living facility Home Access: Level entry     Home Layout: One level     Bathroom Shower/Tub: Producer, television/film/video: Handicapped height Bathroom Accessibility: Yes   Home Equipment: Standard Walker;Cane - single point;Rollator (4 wheels);Shower seat   Additional Comments: living at [facility] ?Green, requiring more assistance      Prior Functioning/Environment Prior Level of Function : Independent/Modified Independent               ADLs Comments: indep but staff assist with meals and laundry    OT Problem List: Decreased activity tolerance;Impaired balance (sitting and/or standing);Cardiopulmonary status limiting activity   OT Treatment/Interventions: Self-care/ADL training;Therapeutic exercise;Neuromuscular education;Energy conservation;DME and/or AE instruction;Therapeutic activities;Patient/family education;Balance training      OT Goals(Current goals can be found in the care plan section)   Acute Rehab OT Goals Patient Stated Goal: to get home OT Goal Formulation: With patient Time For Goal Achievement: 09/14/23 Potential to Achieve Goals: Good ADL Goals Pt Will Perform Lower Body Bathing: with modified independence;sit to/from stand Pt Will Perform Lower Body Dressing: with modified independence;sit to/from stand Pt Will Transfer to Toilet: with modified independence;ambulating;grab bars Pt Will Perform Toileting - Clothing Manipulation and hygiene: with modified independence;sit to/from stand Pt Will Perform Tub/Shower Transfer: with modified independence;Shower  transfer;shower seat;ambulating Additional ADL Goal #1: Patient will teach back 5 P's of ECT and integrate breathing with min cues   OT Frequency:  Min 1X/week       AM-PAC OT 6 Clicks Daily Activity     Outcome Measure Help from another person eating meals?: None Help from another person taking care of personal grooming?: A Little Help from another person toileting, which includes using toliet, bedpan, or urinal?: A Little Help from another person bathing (including washing, rinsing, drying)?: A Little Help from another person to put on and taking off regular upper body clothing?: A Little Help from another person to put on and taking off regular lower body clothing?: A Little 6 Click Score: 19   End of Session Equipment Utilized During Treatment: Gait belt Nurse Communication: Mobility status  Activity Tolerance: Patient limited by fatigue Patient left: in bed;with bed alarm set;with call bell/phone within reach  OT Visit Diagnosis: Unsteadiness on feet (R26.81);Muscle weakness (generalized) (M62.81)                Time: 8457-8394 OT Time Calculation (min): 23 min Charges:  OT General Charges $OT Visit: 1 Visit OT Evaluation $OT Eval Low Complexity: 1 Low Linh Johannes OT/L Acute Rehabilitation Department  [phone]  08/31/2023, 7:13 PM

## 2023-08-31 NOTE — Progress Notes (Signed)
  Progress Note   Patient: [name] FMW:[id number] DOB: 26-Feb-1950 DOA: 08/27/2023     3 DOS: the patient was seen and examined on 08/31/2023   Brief hospital course: 73 year old man new diagnosis non-small cell lung cancer with ongoing hemoptysis.  He plans to start radiation 8/20.  Assessment and Plan: LUL NSCLC, new diagnosis Hemoptysis, mild  Cont supplemental O2 prn, focus on cough suppression> lidocaine  nebs, hycodan, tessalon  perles, delsym . Cont azithro/ ceftriaxone  for CAP coverage, day 5/5  Hold Plavix  XRT to start tentatively 8/20, to f/u with oncology in clinic.  Appreciate pulmonology.  Macrocytic anemia Dark Stools s/p GI consult 8/13 given declining H/H.  Reportedly pt not wanting endoscopic intervention at this time, cont with PPI therapy, f/u out pt Check anemia panel.  Hemoglobin currently stable.   CKD 3a Stable   HTN HLD Stable.   Depression Anxiety Sleep disturbance  Continue paxil , buspar , seroquel , trazodone     RA hydroxychloroquine     Hx stroke holding plavix  with hemoptysis    Functional decline seen by PT 8/13 with plans to continue PT therapy inpt  At this point plan is home with home health, challenging situation as hemoptysis is unlikely to improve until after radiation therapy.  Will monitor her hemoglobin another 24 to 48 hours.    Subjective:  Reports he has stopped coughing but also that he has coughed up blood.  Unaware of dark stools.  Down in spirits but seems to be doing okay.  Physical Exam: Vitals:   08/31/23 0619 08/31/23 0620 08/31/23 0700 08/31/23 0800  BP: (!) 152/62 135/64 (!) 129/50 121/64  Pulse: 71 73 79 78  Resp: 16 14 16 16   Temp:    98 F (36.7 C)  TempSrc:    Oral  SpO2: 97% 95% (!) 87% 95%  Weight:      Height:       Physical Exam Vitals reviewed.  Constitutional:      General: He is not in acute distress.    Appearance: He is not ill-appearing or toxic-appearing.  Cardiovascular:     Rate and  Rhythm: Normal rate and regular rhythm.     Heart sounds: No murmur heard. Pulmonary:     Effort: Pulmonary effort is normal. No respiratory distress.     Breath sounds: No wheezing, rhonchi or rales.  Neurological:     Mental Status: He is alert.  Psychiatric:        Mood and Affect: Mood normal.        Behavior: Behavior normal.     Data Reviewed: Creatinine stable 1.4 to Hemoglobin stable at 7.5  Family Communication: none present  Disposition: Status is: Inpatient Remains inpatient appropriate because: hemoptysis     Time spent: 35 minutes  Author: Toribio Door, MD 08/31/2023 11:18 AM  For on call review [email or web address].

## 2023-08-31 NOTE — Plan of Care (Signed)
  Problem: Education: Goal: Knowledge of General Education information will improve Description: Including pain rating scale, medication(s)/side effects and non-pharmacologic comfort measures Outcome: Progressing   Problem: Health Behavior/Discharge Planning: Goal: Ability to manage health-related needs will improve Outcome: Progressing   Problem: Clinical Measurements: Goal: Cardiovascular complication will be avoided Outcome: Progressing   Problem: Activity: Goal: Risk for activity intolerance will decrease Outcome: Progressing   Problem: Nutrition: Goal: Adequate nutrition will be maintained Outcome: Progressing   Problem: Coping: Goal: Level of anxiety will decrease Outcome: Progressing   Problem: Elimination: Goal: Will not experience complications related to urinary retention Outcome: Progressing   Problem: Pain Managment: Goal: General experience of comfort will improve and/or be controlled Outcome: Progressing   Problem: Safety: Goal: Ability to remain free from injury will improve Outcome: Progressing

## 2023-08-31 NOTE — Hospital Course (Addendum)
 73 year old man new diagnosis non-small cell lung cancer with ongoing hemoptysis.  He plans to start radiation 8/20.  Consultants Admitted by [facility] Radiation oncology Oncology  GI  Procedures/Events 8/10 admit to ICU for hemoptysis 8/12 radiation oncology consult, oncology consult  8/13 GI consult

## 2023-08-31 NOTE — Plan of Care (Signed)
  Problem: Health Behavior/Discharge Planning: Goal: Ability to manage health-related needs will improve Outcome: Progressing   Problem: Clinical Measurements: Goal: Ability to maintain clinical measurements within normal limits will improve Outcome: Progressing Goal: Respiratory complications will improve Outcome: Progressing   Problem: Activity: Goal: Risk for activity intolerance will decrease Outcome: Progressing   Problem: Elimination: Goal: Will not experience complications related to bowel motility Outcome: Not Progressing

## 2023-08-31 NOTE — Progress Notes (Signed)
 NAME:  [name], MRN:  [id number], DOB:  Feb 10, 1950, LOS: 3 ADMISSION DATE:  08/27/2023, CONSULTATION DATE:  08/28/23  REFERRING MD:  ER , CHIEF COMPLAINT:  Hemoptysis   History of Present Illness:   73yoM patient with MGUS, CHF, recent LUL navigational bronch with biopsy on 08/21/13 positive for NSCLC / adeno, TTF-1+ and p40 negative, here for recurrent hemoptysis, tried delsym  at home. He was in the ER earlier today and seen by Pulmonary, patient stabilized and was discharged home. At home he was eating dinner and had another episode of hemoptysis thus came back to the ER. Patient seen in ER room 10, we reviewed the bronch results and I advised him and his daughter that we will need an Oncology evaluation in regards to prognostics while he is admitted.   Pertinent  Medical History   Past Medical History:  Diagnosis Date   (HFpEF) heart failure with preserved ejection fraction (HCC)    Allergy    Anxiety    Arthritis    Chronic kidney disease    Depression    Frequent urination    GERD (gastroesophageal reflux disease)    Gout    Headache    mirgaines in the past   Herpes    History of nuclear stress test    Myoview  05/2021: EF 77, normal perfusion, low risk   Hyperlipidemia    Hypertension    IBS (irritable bowel syndrome)    Memory loss    MGUS (monoclonal gammopathy of unknown significance)    Obesity    Pre-diabetes    Stroke [facility])    Urgency of urination      Significant Hospital Events: Including procedures, antibiotic start and stop dates in addition to other pertinent events   08/28/23 - admit to ICU for hemoptysis 8/13 improving hemoptysis, lidocaine  nebs added   Interim History / Subjective:  Stable, no worsening of hemoptysis> remains dark tinged Remains on 2L  Required one lidocaine  neb last evening ~ 2230 for coughing episode   Objective    Blood pressure 121/64, pulse 78, temperature 98 F (36.7 C), temperature source Oral, resp. rate 16, height  5' 7 (1.702 m), weight 88 kg, SpO2 95%.        Intake/Output Summary (Last 24 hours) at 08/31/2023 0901 Last data filed at 08/31/2023 0745 Gross per 24 hour  Intake 194.25 ml  Output 1100 ml  Net -905.75 ml   Filed Weights   08/29/23 0400 08/30/23 0502 08/31/23 0500  Weight: 87 kg 86.5 kg 88 kg   Examination: General:  elderly male lying in bed in NAD Neuro: alert, appropriate  CV: rr, no murmur PULM:  non labored, clear anteriorly, few intermittent right posterior basilar rales, 2L [place] GI: soft, bs+ Extremities: warm/dry, no pitting edema   Pending am labs  afebrile  Assessment and Plan    LUL NSCLC - new dx   Hemoptysis, mild  - biopsy positive for NSCLC/adeno, TTF-1+ , p40 negative. -had a dry cough following bronch/cryobx  a week ago, progressed to some hemoptysis which initially abated and then recurred. ED 8/10 then dc home, back 8/11 with the same  - completed 2 days of TXA nebs 8/13 am P - hemoptysis stable, stable O2 requirements - cont supplemental O2 prn - cont to focus on cough suppression> lidocaine  nebs, hycodan, tessalon  perles, delsym  - cont azithro/ ceftriaxone  for CAP coverage, day 4/5x - Duonebs q4hrs PRN - cont to hold plavix  - radiation txs to start tentatively  8/20, to f/u with oncology in clinic - Pulmonary will continue to follow  Dark Stools - s/p GI consult 8/13 given declining H/H Recommend GI consult due to declinging hemoglobin.  Pt not wanting endoscopic intervention at this time, cont with PPI therapy, f/u out pt - pending H/H today  CKD 3a P - trending UOP/ renal indices   HTN HLD - amlodipine , lasix  and statin per primary team  Anemia - type and screen from 8/11 - pending H/H today  - transfuse for hemoglobin 7 or less  Depression Anxiety Sleep disturbance  -  home paxil , buspar , seroquel , trazodone  per primary team  RA - hydroxychloroquine  per primary  Hx stroke - holding plavix  with hemoptysis   Functional  decline - seen by PT 8/13 with plans to continue PT therapy inpt  Best Practice (right click and Reselect all SmartList Selections daily)  Per primary   Pt and daughter (by phone) updated 8/14.    Labs   CBC: Recent Labs  Lab 08/27/23 1316 08/27/23 2357 08/28/23 9360 08/29/23 0332 08/30/23 0714 08/30/23 2055 08/31/23 0829  WBC 7.2 8.2 7.2 7.1 6.5  --   --   NEUTROABS 4.9  --   --   --   --   --   --   HGB 12.6* 10.9* 10.0* 8.6* 7.8* 8.4* 7.5*  HCT 38.6* 32.9* 31.2* 26.2* 24.2* 26.6* 22.2*  MCV 99.0 98.5 101.3* 101.2* 101.7*  --   --   PLT 300 309 270 242 236  --   --     Basic Metabolic Panel: Recent Labs  Lab 08/27/23 1316 08/27/23 2357 08/28/23 0639 08/29/23 0332 08/30/23 0714  NA 133* 138 140 139 138  K 3.7 3.5 4.0 3.5 3.4*  CL 102 108 109 110 110  CO2 21* 18* 20* 20* 19*  GLUCOSE 105* 103* 95 107* 110*  BUN 21 40* 52* 45* 26*  CREATININE 1.53* 1.65* 1.49* 1.48* 1.43*  CALCIUM  8.6* 8.5* 8.4* 8.3* 8.1*  MG  --   --  2.2  --   --   PHOS  --   --  3.4  --   --    GFR: Estimated Creatinine Clearance: 50.2 mL/min (A) (by C-G formula based on SCr of 1.43 mg/dL (H)). Recent Labs  Lab 08/27/23 2357 08/28/23 0639 08/29/23 0332 08/30/23 0714  WBC 8.2 7.2 7.1 6.5    Liver Function Tests: Recent Labs  Lab 08/27/23 2357  AST 22  ALT 17  ALKPHOS 51  BILITOT 0.5  PROT 7.2  ALBUMIN 4.0   No results for input(s): LIPASE, AMYLASE in the last 168 hours. No results for input(s): AMMONIA in the last 168 hours.  ABG    Component Value Date/Time   TCO2 24 11/27/2016 2004     Coagulation Profile: Recent Labs  Lab 08/27/23 1316 08/27/23 2357  INR 1.0 1.1    Cardiac Enzymes: No results for input(s): CKTOTAL, CKMB, CKMBINDEX, TROPONINI in the last 168 hours.  HbA1C: Hgb A1c MFr Bld  Date/Time Value Ref Range Status  07/01/2023 05:40 AM 5.2 4.8 - 5.6 % Final    Comment:    (NOTE) Diagnosis of Diabetes The following HbA1c ranges  recommended by the American Diabetes Association (ADA) may be used as an aid in the diagnosis of diabetes mellitus.  Hemoglobin             Suggested A1C NGSP%              Diagnosis  <  5.7                   Non Diabetic  5.7-6.4                Pre-Diabetic  >6.4                   Diabetic  <7.0                   Glycemic control for                       adults with diabetes.    06/04/2020 01:20 PM 5.9 (H) 4.8 - 5.6 % Final    Comment:             Prediabetes: 5.7 - 6.4          Diabetes: >6.4          Glycemic control for adults with diabetes: <7.0     CBG: No results for input(s): GLUCAP in the last 168 hours.   CCT: n/a     Lyle Pesa, MSN, AG-ACNP-BC [facility] 08/31/2023, 9:01 AM  See Amion for pager If no response to pager , please call [phone] until 7pm After 7:00 pm call Elink  [phone]

## 2023-08-31 NOTE — TOC Progression Note (Signed)
 Transition of Care [facility]) - Progression Note    Patient Details  Name: [name] MRN: [id number] Date of Birth: January 12, 1951  [facility] CM/SW Contact  Jon ONEIDA Anon, RN Phone Number: 08/31/2023, 1:45 PM  Clinical Narrative:    NCM met with pt at bedside to discuss PT recommendation for [facility] at discharge. Pt voices understanding is agreeable to recommendation. NCM faxed HHPT orders to [facility] at [phone]. Care Management continuing to follow.     Expected Discharge Plan: Home/Self Care Barriers to Discharge: Continued Medical Work up               Expected Discharge Plan and Services In-house Referral: NA Discharge Planning Services: CM Consult Post Acute Care Choice: NA Living arrangements for the past 2 months: Independent Living Facility                 DME Arranged: N/A DME Agency: NA       HH Arranged: NA HH Agency: NA         Social Drivers of Health (SDOH) Interventions SDOH Screenings   Food Insecurity: No Food Insecurity (08/28/2023)  Housing: Low Risk  (08/28/2023)  Transportation Needs: No Transportation Needs (08/28/2023)  Utilities: Not At Risk (08/28/2023)  Alcohol  Screen: Low Risk  (02/02/2019)  Depression (PHQ2-9): Medium Risk (06/29/2021)  Social Connections: Moderately Isolated (08/28/2023)  Tobacco Use: Medium Risk (08/27/2023)    Readmission Risk Interventions    08/29/2023    3:50 PM 07/03/2023    1:33 PM  Readmission Risk Prevention Plan  Transportation Screening Complete Complete  PCP or Specialist Appt within 5-7 Days  Complete  PCP or Specialist Appt within 3-5 Days Complete   Home Care Screening  Complete  Medication Review (RN CM)  Complete  HRI or Home Care Consult Complete   Social Work Consult for Recovery Care Planning/Counseling Complete   Palliative Care Screening Complete   Medication Review [employer]) Complete

## 2023-09-01 ENCOUNTER — Other Ambulatory Visit (HOSPITAL_COMMUNITY): Payer: Self-pay

## 2023-09-01 ENCOUNTER — Other Ambulatory Visit: Payer: Self-pay | Admitting: Neurology

## 2023-09-01 DIAGNOSIS — D500 Iron deficiency anemia secondary to blood loss (chronic): Secondary | ICD-10-CM

## 2023-09-01 DIAGNOSIS — D539 Nutritional anemia, unspecified: Secondary | ICD-10-CM | POA: Diagnosis not present

## 2023-09-01 DIAGNOSIS — C3490 Malignant neoplasm of unspecified part of unspecified bronchus or lung: Secondary | ICD-10-CM | POA: Diagnosis not present

## 2023-09-01 DIAGNOSIS — R042 Hemoptysis: Secondary | ICD-10-CM | POA: Diagnosis not present

## 2023-09-01 DIAGNOSIS — G250 Essential tremor: Secondary | ICD-10-CM

## 2023-09-01 LAB — CBC
HCT: 23.6 % — ABNORMAL LOW (ref 39.0–52.0)
Hemoglobin: 7.7 g/dL — ABNORMAL LOW (ref 13.0–17.0)
MCH: 33.3 pg (ref 26.0–34.0)
MCHC: 32.6 g/dL (ref 30.0–36.0)
MCV: 102.2 fL — ABNORMAL HIGH (ref 80.0–100.0)
Platelets: 261 K/uL (ref 150–400)
RBC: 2.31 MIL/uL — ABNORMAL LOW (ref 4.22–5.81)
RDW: 12.6 % (ref 11.5–15.5)
WBC: 6 K/uL (ref 4.0–10.5)
nRBC: 0 % (ref 0.0–0.2)

## 2023-09-01 LAB — FOLATE: Folate: 27.5 ng/mL (ref >5.9)

## 2023-09-01 LAB — IRON AND TIBC
Iron: 18 ug/dL — ABNORMAL LOW (ref 45–182)
Saturation Ratios: 6 % — ABNORMAL LOW (ref 17.9–39.5)
TIBC: 288 ug/dL (ref 250–450)
UIBC: 270 ug/dL

## 2023-09-01 LAB — RETICULOCYTES
Immature Retic Fract: 34.4 % — ABNORMAL HIGH (ref 2.3–15.9)
RBC.: 2.29 MIL/uL — ABNORMAL LOW (ref 4.22–5.81)
Retic Count, Absolute: 80.8 K/uL (ref 19.0–186.0)
Retic Ct Pct: 3.5 % — ABNORMAL HIGH (ref 0.4–3.1)

## 2023-09-01 LAB — FERRITIN: Ferritin: 17 ng/mL — ABNORMAL LOW (ref 24–336)

## 2023-09-01 LAB — VITAMIN B12: Vitamin B-12: 369 pg/mL (ref 180–914)

## 2023-09-01 MED ORDER — BENZONATATE 200 MG PO CAPS: 200.0000 mg | ORAL_CAPSULE | Freq: Two times a day (BID) | ORAL | Status: DC

## 2023-09-01 MED ORDER — BENZONATATE 200 MG PO CAPS: 200.0000 mg | ORAL_CAPSULE | Freq: Two times a day (BID) | ORAL | 2 refills | Status: DC

## 2023-09-01 MED ORDER — HYDROCODONE BIT-HOMATROP MBR 5-1.5 MG/5ML PO SOLN
5.0000 mL | ORAL | 0 refills | Status: AC | PRN
  Filled 2023-09-01: qty 120, 4d supply, fill #0

## 2023-09-01 MED ORDER — FLUTICASONE PROPIONATE 50 MCG/ACT NA SUSP
1.0000 | Freq: Every day | NASAL | 2 refills | Status: AC
Start: 2023-09-02 — End: ?
  Filled 2023-09-01: qty 16, 30d supply, fill #0

## 2023-09-01 NOTE — Progress Notes (Signed)
 Discharge medication in a secure bag delivered to pt in room by this RN. Pt requested secure bag be placed in his pt belonging bag- done by this Rn- primary nurse updated via secure chat of delivery

## 2023-09-01 NOTE — Progress Notes (Signed)
 NAME:  [name], MRN:  [id number], DOB:  1950-02-10, LOS: 4 ADMISSION DATE:  08/27/2023, CONSULTATION DATE:  08/28/23  REFERRING MD:  ER , CHIEF COMPLAINT:  Hemoptysis   History of Present Illness:   73yoM patient with MGUS, CHF, recent LUL navigational bronch with biopsy on 08/21/13 positive for NSCLC / adeno, TTF-1+ and p40 negative, here for recurrent hemoptysis, tried delsym  at home. He was in the ER earlier today and seen by Pulmonary, patient stabilized and was discharged home. At home he was eating dinner and had another episode of hemoptysis thus came back to the ER. Patient seen in ER room 10, we reviewed the bronch results and I advised him and his daughter that we will need an Oncology evaluation in regards to prognostics while he is admitted.   Pertinent  Medical History   Past Medical History:  Diagnosis Date   (HFpEF) heart failure with preserved ejection fraction (HCC)    Allergy    Anxiety    Arthritis    Chronic kidney disease    Depression    Frequent urination    GERD (gastroesophageal reflux disease)    Gout    Headache    mirgaines in the past   Herpes    History of nuclear stress test    Myoview  05/2021: EF 77, normal perfusion, low risk   Hyperlipidemia    Hypertension    IBS (irritable bowel syndrome)    Memory loss    MGUS (monoclonal gammopathy of unknown significance)    Obesity    Pre-diabetes    Stroke [facility])    Urgency of urination      Significant Hospital Events: Including procedures, antibiotic start and stop dates in addition to other pertinent events   08/28/23 - admit to ICU for hemoptysis 08/30/23 - GI consult for dark stools - lower concern for GI bleeding and that he is swallowing hemoptysis. 08/31/23 - continues with low grade hemoptysis, received 1 unit PRBCs  Interim History / Subjective:   He had a good night overnight He had low grade hemoptysis yesterday, received blood transfusion Feeling ok today  Objective    Blood  pressure (!) 147/76, pulse 77, temperature (!) 97.5 F (36.4 C), temperature source Oral, resp. rate 13, height 5' 7 (1.702 m), weight 88.2 kg, SpO2 99%.        Intake/Output Summary (Last 24 hours) at 09/01/2023 9166 Last data filed at 08/31/2023 2200 Gross per 24 hour  Intake 1294.06 ml  Output 300 ml  Net 994.06 ml   Filed Weights   08/30/23 0502 08/31/23 0500 09/01/23 0500  Weight: 86.5 kg 88 kg 88.2 kg   Examination: General: elderly male, no distress HENT: NCAT, moist mucous membranes  Lungs: even unlabored on RA  Cardiovascular: rrr, no murmurs Abdomen: soft  Extremities: no obvious joint deformity  GU defer  Skin c/d/w    Assessment and Plan    LUL NSCLC - new dx   Hemoptysis, mild  - biopsy positive for NSCLC/adeno, TTF-1+ , p40 negative. -had a dry cough following bronch/cryobx  a week ago, progressed to some hemoptysis which initially abated and then recurred. ED 8/10 then dc home, back 8/11 with the same  - completed 2 days of TXA nebs 8/13 am  P - Duonebs q4hrs PRN - continue to hold plavix  -cont antitussives: hycodan cough syrup, tessalon  perles, delsym  - Lidocaine  nebs as needed -CAP coverage to be completed today -onc, rad onc have been consulted. Plan to  start radiation 8/20  Dark Stools - GI consulted 8/13 - started PPI therapy on 8/11 - continue to monitor - trend CBC  CKD 3a P -monitor  HTN HLD -amlodipine , lasix  and statin  Anemia -Hgb trending down - type and screen from 8/11 - transfuse for hemoglobin 7 or less - GI consult placed  Depression Anxiety Sleep disturbance  -cont home paxil , buspar , seroquel , trazodone    RA -cont home hydroxychloroquine    Hx stroke -holding plavix     Best Practice (right click and Reselect all SmartList Selections daily)   Diet/type: Regular consistency (see orders) DVT prophylaxis SCD Pressure ulcer(s): N/A GI prophylaxis: H2B Lines: N/A Foley:  N/A Code Status:  full code Last  date of multidisciplinary goals of care discussion [08/30/23] -- talked w pt and daughter and patient  Labs   CBC: Recent Labs  Lab 08/27/23 1316 08/27/23 2357 08/28/23 0639 08/29/23 0332 08/30/23 0714 08/30/23 2055 08/31/23 0829 09/01/23 0325  WBC 7.2 8.2 7.2 7.1 6.5  --   --  6.0  NEUTROABS 4.9  --   --   --   --   --   --   --   HGB 12.6* 10.9* 10.0* 8.6* 7.8* 8.4* 7.5* 7.7*  HCT 38.6* 32.9* 31.2* 26.2* 24.2* 26.6* 22.2* 23.6*  MCV 99.0 98.5 101.3* 101.2* 101.7*  --   --  102.2*  PLT 300 309 270 242 236  --   --  261    Basic Metabolic Panel: Recent Labs  Lab 08/27/23 2357 08/28/23 0639 08/29/23 0332 08/30/23 0714 08/31/23 0829  NA 138 140 139 138 141  K 3.5 4.0 3.5 3.4* 3.7  CL 108 109 110 110 112*  CO2 18* 20* 20* 19* 22  GLUCOSE 103* 95 107* 110* 111*  BUN 40* 52* 45* 26* 18  CREATININE 1.65* 1.49* 1.48* 1.43* 1.42*  CALCIUM  8.5* 8.4* 8.3* 8.1* 8.3*  MG  --  2.2  --   --   --   PHOS  --  3.4  --   --   --    GFR: Estimated Creatinine Clearance: 50.5 mL/min (A) (by C-G formula based on SCr of 1.42 mg/dL (H)). Recent Labs  Lab 08/28/23 0639 08/29/23 0332 08/30/23 0714 09/01/23 0325  WBC 7.2 7.1 6.5 6.0    Liver Function Tests: Recent Labs  Lab 08/27/23 2357  AST 22  ALT 17  ALKPHOS 51  BILITOT 0.5  PROT 7.2  ALBUMIN 4.0   No results for input(s): LIPASE, AMYLASE in the last 168 hours. No results for input(s): AMMONIA in the last 168 hours.  ABG    Component Value Date/Time   TCO2 24 11/27/2016 2004     Coagulation Profile: Recent Labs  Lab 08/27/23 1316 08/27/23 2357  INR 1.0 1.1    Cardiac Enzymes: No results for input(s): CKTOTAL, CKMB, CKMBINDEX, TROPONINI in the last 168 hours.  HbA1C: Hgb A1c MFr Bld  Date/Time Value Ref Range Status  07/01/2023 05:40 AM 5.2 4.8 - 5.6 % Final    Comment:    (NOTE) Diagnosis of Diabetes The following HbA1c ranges recommended by the American Diabetes Association (ADA) may  be used as an aid in the diagnosis of diabetes mellitus.  Hemoglobin             Suggested A1C NGSP%              Diagnosis  <5.7  Non Diabetic  5.7-6.4                Pre-Diabetic  >6.4                   Diabetic  <7.0                   Glycemic control for                       adults with diabetes.    06/04/2020 01:20 PM 5.9 (H) 4.8 - 5.6 % Final    Comment:             Prediabetes: 5.7 - 6.4          Diabetes: >6.4          Glycemic control for adults with diabetes: <7.0     CBG: No results for input(s): GLUCAP in the last 168 hours.   Dorn Chill, MD [facility] Office: [phone]   See Amion for personal pager PCCM on call pager [phone] until 7pm. Please call Elink 7p-7a. [phone]

## 2023-09-01 NOTE — Progress Notes (Addendum)
  Progress Note   Patient: [name] FMW:[id number] DOB: [birth year] DOA: 08/27/2023     4 DOS: the patient was seen and examined on 09/01/2023   Brief hospital course: 73 year old man new diagnosis non-small cell lung cancer with ongoing hemoptysis.  He plans to start radiation 8/20.  Assessment and Plan: LUL NSCLC, new diagnosis Hemoptysis, mild  Cont supplemental O2 prn, focus on cough suppression> lidocaine  nebs, hycodan, tessalon  perles, delsym . Completes azithro/ceftriaxone  for CAP coverage today 8/15.  Continue to hold Plavix  XRT to start 8/20, to f/u with oncology in clinic.  Appreciate pulmonology.   Macrocytic anemia Iron deficiency anemia Dark Stools Dark stools probably from swallowed blood from hemoptysis.  S/p GI consult 8/13 given declining H/H.  Reportedly pt not wanting endoscopic intervention at this time, cont with PPI therapy, f/u out pt Will need outpatient iron infusion, consider ESA.   CKD IIIa Stable   HTN HLD Stable.   Depression Anxiety Sleep disturbance  Continue Paxil , Buspar , Seroquel , trazodone     RA Continue hydroxychloroquine     Hx stroke Holding plavix  with hemoptysis    Functional decline Seen by PT 8/13 with plans to continue PT therapy inpt   At this point plan is home with home health, challenging situation as hemoptysis is unlikely to improve until after radiation therapy.  Will monitor her hemoglobin another 24 to 48 hours.      Subjective:  No hemoptysis recently Breathing ok Cough is decreased  Physical Exam: Vitals:   09/01/23 0600 09/01/23 0700 09/01/23 0800 09/01/23 0803  BP: 107/62 (!) 147/76    Pulse: 68 71 77   Resp: 14 14 13    Temp:    (!) 97.5 F (36.4 C)  TempSrc:    Oral  SpO2: 100% 97% 99%   Weight:      Height:       Physical Exam Vitals reviewed.  Constitutional:      General: He is not in acute distress.    Appearance: He is not ill-appearing or toxic-appearing.  Cardiovascular:     Rate  and Rhythm: Normal rate and regular rhythm.     Heart sounds: No murmur heard. Pulmonary:     Effort: Pulmonary effort is normal. No respiratory distress.     Breath sounds: No wheezing, rhonchi or rales.  Neurological:     Mental Status: He is alert.  Psychiatric:        Mood and Affect: Mood normal.        Behavior: Behavior normal.     Data Reviewed: Serum ferritin is 17 Hgb is stable 7.7  Family Communication:   Disposition: Status is: Inpatient Remains inpatient appropriate because: hemoptysis     Time spent: 20 minutes  Author: Toribio Door, MD 09/01/2023 8:51 AM  For on call review [email or web address].

## 2023-09-01 NOTE — Plan of Care (Signed)
  Problem: Education: Goal: Knowledge of General Education information will improve Description: Including pain rating scale, medication(s)/side effects and non-pharmacologic comfort measures Outcome: Progressing   Problem: Health Behavior/Discharge Planning: Goal: Ability to manage health-related needs will improve Outcome: Progressing   Problem: Clinical Measurements: Goal: Ability to maintain clinical measurements within normal limits will improve Outcome: Progressing Goal: Respiratory complications will improve Outcome: Progressing Goal: Cardiovascular complication will be avoided Outcome: Progressing   Problem: Activity: Goal: Risk for activity intolerance will decrease Outcome: Progressing   Problem: Coping: Goal: Level of anxiety will decrease Outcome: Progressing   Problem: Elimination: Goal: Will not experience complications related to urinary retention Outcome: Progressing   Problem: Safety: Goal: Ability to remain free from injury will improve Outcome: Progressing

## 2023-09-01 NOTE — Progress Notes (Signed)
 This RN spoke to pt about  discharge cough medicine not being covered by his insurance - Cash price is approximately $27.00 Pt verbalized an understanding and asked to be billed. Primary nurse and pharmacy updated by this RN via secure chat.

## 2023-09-02 DIAGNOSIS — R042 Hemoptysis: Secondary | ICD-10-CM | POA: Diagnosis not present

## 2023-09-02 DIAGNOSIS — D500 Iron deficiency anemia secondary to blood loss (chronic): Secondary | ICD-10-CM | POA: Diagnosis not present

## 2023-09-02 DIAGNOSIS — C3490 Malignant neoplasm of unspecified part of unspecified bronchus or lung: Secondary | ICD-10-CM | POA: Diagnosis not present

## 2023-09-02 DIAGNOSIS — D539 Nutritional anemia, unspecified: Secondary | ICD-10-CM | POA: Diagnosis not present

## 2023-09-02 LAB — CBC
HCT: 22.8 % — ABNORMAL LOW (ref 39.0–52.0)
Hemoglobin: 7.5 g/dL — ABNORMAL LOW (ref 13.0–17.0)
MCH: 33.5 pg (ref 26.0–34.0)
MCHC: 32.9 g/dL (ref 30.0–36.0)
MCV: 101.8 fL — ABNORMAL HIGH (ref 80.0–100.0)
Platelets: 293 K/uL (ref 150–400)
RBC: 2.24 MIL/uL — ABNORMAL LOW (ref 4.22–5.81)
RDW: 12.8 % (ref 11.5–15.5)
WBC: 7.1 K/uL (ref 4.0–10.5)
nRBC: 0 % (ref 0.0–0.2)

## 2023-09-02 NOTE — Discharge Summary (Addendum)
 Physician Discharge Summary   Patient: [name] MRN: [id number] DOB: [birth year]  Admit date:     08/27/2023  Discharge date: 09/02/23  Discharge Physician: Toribio Door   PCP: [name], MD   Recommendations at discharge:   Plans to start radiation next week, follow-up hemoptysis, anemia.  Outpatient iron infusion recommended.  Holding Plavix  for now.  Discharge Diagnoses: Principal Problem:   Hemoptysis LUL NSCLC, new diagnosis Macrocytic anemia Iron deficiency anemia Dark Stools CKD IIIa HTN HLD Depression Anxiety RA Hx stroke Functional decline  Hospital Course: 73 year old man new diagnosis non-small cell lung cancer with ongoing hemoptysis.  He was admitted by critical care but quickly improved.  Hemoptysis was ongoing but low-volume and he did not require blood products.  Eventually hemoptysis decreased and then stopped with adequate control of coughing.  He was seen by gastroenterology for concern of dark stools, but this is probably related to swallowed blood and no further evaluation was pursued.  Hemoglobin stabilized, his cough suppressed, hemoptysis resolved.  Plan is to return to independent living with medication adjustments as below.  He plans to start radiation 8/20.  Discussed in detail with daughter yesterday at bedside, with 3 medications and plan together.  Consultants Admitted by [facility] Radiation oncology Oncology  GI  Procedures/Events 8/10 admit to ICU for hemoptysis 8/12 radiation oncology consult, oncology consult  8/13 GI consult  [facility] GI Dr Gearline PCP Dr Rosemarie Dr Emilee   LUL NSCLC, new diagnosis Hemoptysis, mild persistent cough MRI brain no metastatic disease.  CT chest no PE.  Focus on cough suppression: hycodan, tessalon  perles, delsym . Completed azithro/ceftriaxone  for CAP coverage 8/15.  Continue to hold Plavix  XRT to start 8/20, to f/u with oncology in clinic.  Appreciate pulmonology.   Macrocytic  anemia Iron deficiency anemia Dark Stools Dark stools probably from swallowed blood from hemoptysis.  S/p GI consult 8/13 given declining H/H.  Reportedly pt not wanting endoscopic intervention at this time, cont with PPI therapy, f/u out pt. Hgb stable. Will need outpatient iron infusion, consider ESA.   CKD IIIa Stable   HTN HLD Stable.   Depression Anxiety Sleep disturbance  Continue Paxil , Buspar , Seroquel , trazodone     RA Continue hydroxychloroquine     Hx stroke Holding plavix  with hemoptysis    Functional decline Seen by PT 8/13 with plans to continue PT therapy inpt       Pain control - [place]  Controlled Substance Reporting System database was reviewed.   Disposition: Home health Diet recommendation:  Regular diet DISCHARGE MEDICATION: Allergies as of 09/02/2023       Reactions   Divalproex Sodium Hives   Lamotrigine Diarrhea, Other (See Comments)   Shakes and unsteadiness   Valproic Acid Hives, Other (See Comments)   Shakes GI problems, too   Amitriptyline Other (See Comments)   Reaction not known   Aspirin  Nausea And Vomiting, Other (See Comments)   Dizziness, weakness and balance issues, and sweats too   Hydrocodone  Nausea And Vomiting   Lisinopril Cough      Lithium Diarrhea, Other (See Comments)   Falls over and shakes    Morphine  Other (See Comments)   'not tolerated well   Nsaids Other (See Comments)   Kidney disease   Oxycodone Nausea And Vomiting   Oxycodone-acetaminophen  Nausea And Vomiting, Other (See Comments)   Also causes dizziness   Pork Allergy Nausea Only        Medication List     PAUSE taking these medications  clopidogrel  75 MG tablet Wait to take this until your doctor or other care provider tells you to start again. Commonly known as: PLAVIX  Take 1 tablet (75 mg total) by mouth daily. Okay to restart this medication on 08/22/2023.   hydrALAZINE  50 MG tablet Wait to take this until your doctor or other  care provider tells you to start again. Commonly known as: APRESOLINE  Take 1.5 tablets (75 mg total) by mouth 2 (two) times daily.       TAKE these medications    acetaminophen  500 MG tablet Commonly known as: TYLENOL  Take 1-2 tablets (500-1,000 mg total) by mouth every 6 (six) hours as needed for moderate pain.   Acidophilus Caps capsule Take 1 capsule by mouth daily. What changed: when to take this   allopurinol  100 MG tablet Commonly known as: ZYLOPRIM  Take 150 mg by mouth daily.   amLODipine  10 MG tablet Commonly known as: NORVASC  Take 1 tablet (10 mg total) by mouth daily.   benzonatate  200 MG capsule Commonly known as: TESSALON  Take 1 capsule (200 mg total) by mouth in the morning and at bedtime. What changed:  when to take this reasons to take this   busPIRone  30 MG tablet Commonly known as: BUSPAR  Take 30 mg by mouth 2 (two) times daily.   cetirizine  10 MG tablet Commonly known as: ZyrTEC  Allergy Take 1 tablet (10 mg total) by mouth daily. What changed: when to take this   clindamycin 300 MG capsule Commonly known as: CLEOCIN Take 300 mg by mouth 3 (three) times daily.   dextromethorphan  30 MG/5ML liquid Commonly known as: Delsym  Take 5 mLs (30 mg total) by mouth 2 (two) times daily as needed for cough.   fluticasone  50 MCG/ACT nasal spray Commonly known as: FLONASE  Place 1 spray into both nostrils daily.   folic acid  1 MG tablet Commonly known as: FOLVITE  Take 1 mg by mouth daily.   furosemide  40 MG tablet Commonly known as: LASIX  Take 1 tablet (40 mg total) by mouth daily.   HYDROcodone  bit-homatropine 5-1.5 MG/5ML syrup Commonly known as: HYCODAN Take 5 mLs by mouth every 4 (four) hours as needed (Persistent cough).   Hydroxychloroquine  Sulfate 400 MG Tabs Take 400 mg by mouth at bedtime.   lidocaine  5 % Commonly known as: LIDODERM  Place 1 patch onto the skin daily as needed (for pain- Remove & Discard patch within 12 hours or as  directed by MD).   PARoxetine  37.5 MG 24 hr tablet Commonly known as: PAXIL -CR Take 1 tablet (37.5 mg total) by mouth at bedtime.   primidone  250 MG tablet Commonly known as: MYSOLINE  TAKE 1 TABLET BY MOUTH TWICE A DAY   QUEtiapine  25 MG tablet Commonly known as: SEROQUEL  Take 25 mg by mouth at bedtime.   rosuvastatin  40 MG tablet Commonly known as: CRESTOR  Take 40 mg by mouth daily.   traZODone  100 MG tablet Commonly known as: DESYREL  Take 100 mg by mouth at bedtime.   valACYclovir  1000 MG tablet Commonly known as: VALTREX  Take 1,000 mg by mouth daily.   Vitamin D  (Ergocalciferol ) 1.25 MG (50000 UNIT) Caps capsule Commonly known as: DRISDOL  Take 50,000 Units by mouth every Saturday.        Follow-up Information     Connect with your PCP/Specialist as discussed. Schedule an appointment as soon as possible for a visit .   Contact information: [email or web address] Call our physician referral line at [phone].        [name], MD Follow up.  Specialty: Family Medicine Why: As needed Contact information: [address] [place] [address] [phone]          [facility] Follow up.   Why: Office will call you with an appointment               Feels good No bleeding Breathing well  Discharge Exam: Filed Weights   08/31/23 0500 09/01/23 0500 09/02/23 0402  Weight: 88 kg 88.2 kg 87.3 kg   Physical Exam Vitals reviewed.  Constitutional:      General: He is not in acute distress.    Appearance: He is not ill-appearing or toxic-appearing.  Cardiovascular:     Rate and Rhythm: Normal rate and regular rhythm.     Heart sounds: No murmur heard. Pulmonary:     Effort: Pulmonary effort is normal. No respiratory distress.     Breath sounds: No wheezing, rhonchi or rales.  Neurological:     Mental Status: He is alert.  Psychiatric:        Mood and Affect: Mood normal.        Behavior: Behavior  normal.      Condition at discharge: fair  The results of significant diagnostics from this hospitalization (including imaging, microbiology, ancillary and laboratory) are listed below for reference.   Imaging Studies: DG Chest Port 1 View Result Date: 08/29/2023 CLINICAL DATA:  Shortness of breath. EXAM: PORTABLE CHEST 1 VIEW COMPARISON:  Chest CTA 08/27/2023 chest radiograph 08/27/2023 FINDINGS: Again noted is the mass in the left upper lobe/left suprahilar region. Hazy densities in the left lung most likely associated with some volume loss. Right lung remains clear. Heart size is stable and within normal limits. Trachea is midline. Negative for a pneumothorax. No acute bone abnormality. IMPRESSION: 1. Persistent mass in the left upper lobe. 2. Hazy densities in the left lung are most compatible with volume loss. Electronically Signed   By: Juliene Balder M.D.   On: 08/29/2023 08:28   MR BRAIN W WO CONTRAST Result Date: 08/28/2023 CLINICAL DATA:  Metastatic disease evaluation EXAM: MRI HEAD WITHOUT AND WITH CONTRAST TECHNIQUE: Multiplanar, multiecho pulse sequences of the brain and surrounding structures were obtained without and with intravenous contrast. CONTRAST:  8mL GADAVIST  GADOBUTROL  1 MMOL/ML IV SOLN COMPARISON:  None Available. FINDINGS: MRI brain: There are multiple foci of encephalomalacia in the periventricular white matter of both hemispheres, left more than right with corpus callosal atrophy. There is no abnormal enhancement. No evidence of metastatic disease. There is no acute or chronic infarct. The ventricles are normal. No mass lesion. There are normal flow signals in the carotid arteries and basilar artery. No significant bone marrow signal abnormality. No significant abnormality in the paranasal sinuses or soft tissues. IMPRESSION: 1. There is no evidence of metastatic disease 2. There are multiple periventricular foci of encephalomalacia, left hemisphere more than the right, with  atrophy of the corpus callosum. The pattern of disease is suggestive of chronic demyelination Electronically Signed   By: Nancyann Burns M.D.   On: 08/28/2023 11:54   CT Angio Chest PE W and/or Wo Contrast Result Date: 08/27/2023 CLINICAL DATA:  Hemoptysis.  Lung mass.  * Tracking Code: BO * EXAM: CT ANGIOGRAPHY CHEST WITH CONTRAST TECHNIQUE: Multidetector CT imaging of the chest was performed using the standard protocol during bolus administration of intravenous contrast. Multiplanar CT image reconstructions and MIPs were obtained to evaluate the vascular anatomy. RADIATION DOSE REDUCTION: This exam was performed according to the departmental dose-optimization program which  includes automated exposure control, adjustment of the mA and/or kV according to patient size and/or use of iterative reconstruction technique. CONTRAST:  75mL OMNIPAQUE  IOHEXOL  350 MG/ML SOLN COMPARISON:  PET 07/26/2023 and CT chest 06/30/2023. FINDINGS: Cardiovascular: Negative for pulmonary embolus. Atherosclerotic calcification of the aorta. Heart is at the upper limits of normal in size to mildly enlarged. No pericardial effusion. Enlarged pulmonic trunk. Mediastinum/Nodes: Left hilar adenopathy measures up to 1.5 cm. No pathologically enlarged mediastinal or axillary lymph nodes. Air in the esophagus can be seen with dysmotility. Lungs/Pleura: Left upper lobe mass measures approximately 5.0 x 5.6 cm, stable to minimally enlarged from 4.5 x 5.2 cm on 06/30/2023. Long border of contact with the adjacent mediastinum with extension to the left hilum. 10 mm ground-glass nodule in the posterolateral right lower lobe (12/77), unchanged. Newly obstructed superior segmental left lower lobe bronchus with mild postobstructive ground-glass nodularity in the left lower lobe. No pleural fluid. Airway is otherwise unremarkable. Upper Abdomen: Hepatic cysts. Cholecystectomy. Low-attenuation lesions in the kidneys. No specific follow-up necessary.  Elevated left hemidiaphragm. Visualized portions of the liver, adrenal glands, kidneys, spleen, pancreas, stomach and bowel are otherwise grossly unremarkable. No upper abdominal adenopathy. Musculoskeletal: Degenerative changes in the spine. Review of the MIP images confirms the above findings. IMPRESSION: 1. Negative for pulmonary embolus. 2. Similar to minimally enlarged left upper lobe mass with left hilar adenopathy, compatible with primary bronchogenic carcinoma. 3. New obstruction of the superior segmental left lower lobe bronchus with postobstructive ground-glass nodularity in the left lower lobe. 4. Stable 10 mm ground-glass right lower lobe nodule. Recommend attention on follow-up. 5.  Aortic atherosclerosis (ICD10-I70.0). 6. Electronically Signed   By: Newell Eke M.D.   On: 08/27/2023 17:27   DG Chest Port 1 View Result Date: 08/27/2023 CLINICAL DATA:  Hemoptysis.  Recent bronchoscopy with biopsy. EXAM: PORTABLE CHEST 1 VIEW COMPARISON:  Radiograph 08/21/2023.  PET CT 07/26/2023 FINDINGS: Unchanged mild volume loss in the left hemithorax with left suprahilar opacity corresponding to hypermetabolic mass on PET-CT. No pneumothorax. Stable heart size and mediastinal contours. No new airspace disease. No pulmonary edema. No pleural effusion. IMPRESSION: Unchanged mild volume loss in the left hemithorax with left suprahilar opacity corresponding to hypermetabolic mass on PET-CT. No pneumothorax or new findings. Electronically Signed   By: Andrea Gasman M.D.   On: 08/27/2023 14:05   DG Chest Port 1 View Result Date: 08/21/2023 CLINICAL DATA:  Post bronchoscopy with biopsy. EXAM: PORTABLE CHEST 1 VIEW COMPARISON:  06/30/2023 FINDINGS: Again noted is the left upper lobe suprahilar mass, unchanged since prior study. No pneumothorax following bronchoscopy. Right lung clear. No effusions. Heart is normal size. No acute bony abnormality. IMPRESSION: No pneumothorax following bronchoscopy. Electronically  Signed   By: Franky Crease M.D.   On: 08/21/2023 12:40   DG C-ARM BRONCHOSCOPY Result Date: 08/21/2023 C-ARM BRONCHOSCOPY: Fluoroscopy was utilized by the requesting physician.  No radiographic interpretation.    Microbiology: Results for orders placed or performed during the hospital encounter of 08/27/23  MRSA Next Gen by PCR, Nasal     Status: None   Collection Time: 08/28/23  3:00 AM   Specimen: Nasal Mucosa; Nasal Swab  Result Value Ref Range Status   MRSA by PCR Next Gen NOT DETECTED NOT DETECTED Final    Comment: (NOTE) The GeneXpert MRSA Assay (FDA approved for NASAL specimens only), is one component of a comprehensive MRSA colonization surveillance program. It is not intended to diagnose MRSA infection nor to guide or monitor  treatment for MRSA infections. Test performance is not FDA approved in patients less than 3 years old. Performed at [facility], [address], [place], [place] [address]     Labs: CBC: Recent Labs  Lab 08/27/23 1316 08/27/23 2357 08/28/23 9360 08/29/23 0332 08/30/23 0714 08/30/23 2055 08/31/23 0829 09/01/23 0325 09/02/23 0250  WBC 7.2   < > 7.2 7.1 6.5  --   --  6.0 7.1  NEUTROABS 4.9  --   --   --   --   --   --   --   --   HGB 12.6*   < > 10.0* 8.6* 7.8* 8.4* 7.5* 7.7* 7.5*  HCT 38.6*   < > 31.2* 26.2* 24.2* 26.6* 22.2* 23.6* 22.8*  MCV 99.0   < > 101.3* 101.2* 101.7*  --   --  102.2* 101.8*  PLT 300   < > 270 242 236  --   --  261 293   < > = values in this interval not displayed.   Basic Metabolic Panel: Recent Labs  Lab 08/27/23 2357 08/28/23 0639 08/29/23 0332 08/30/23 0714 08/31/23 0829  NA 138 140 139 138 141  K 3.5 4.0 3.5 3.4* 3.7  CL 108 109 110 110 112*  CO2 18* 20* 20* 19* 22  GLUCOSE 103* 95 107* 110* 111*  BUN 40* 52* 45* 26* 18  CREATININE 1.65* 1.49* 1.48* 1.43* 1.42*  CALCIUM  8.5* 8.4* 8.3* 8.1* 8.3*  MG  --  2.2  --   --   --   PHOS  --  3.4  --   --   --    Liver Function  Tests: Recent Labs  Lab 08/27/23 2357  AST 22  ALT 17  ALKPHOS 51  BILITOT 0.5  PROT 7.2  ALBUMIN 4.0   CBG: No results for input(s): GLUCAP in the last 168 hours.  Discharge time spent: greater than 30 minutes.  Signed: Toribio Door, MD [facility] 09/02/2023

## 2023-09-02 NOTE — Plan of Care (Signed)
  Problem: Education: Goal: Knowledge of General Education information will improve Description: Including pain rating scale, medication(s)/side effects and non-pharmacologic comfort measures Outcome: Progressing   Problem: Activity: Goal: Risk for activity intolerance will decrease Outcome: Progressing   Problem: Nutrition: Goal: Adequate nutrition will be maintained Outcome: Progressing   Problem: Coping: Goal: Level of anxiety will decrease Outcome: Progressing   Problem: Elimination: Goal: Will not experience complications related to bowel motility Outcome: Progressing Goal: Will not experience complications related to urinary retention Outcome: Progressing   Problem: Pain Managment: Goal: General experience of comfort will improve and/or be controlled Outcome: Progressing   Problem: Safety: Goal: Ability to remain free from injury will improve Outcome: Progressing

## 2023-09-02 NOTE — Progress Notes (Signed)
 Occupational Therapy Treatment and Discharge Patient Details Name: [name] MRN: [id number] DOB: 11-25-1950 Today's Date: 09/02/2023   History of present illness .Pt is a 73yo male presenting 08/28/23 with recurrent hemoptysis.Recent LUL navigational bronch with biopsy on 08/21/13 positive for NSCLC / adeno. PMH R-TKA  CHF, OA, depression, GERD, herpes, HLD, HTN, memory loss, hx of stroke, AKI, depression, tardive dyskinesia, SCLC, IBS, bronchoscopy 8/4   OT comments  Met with pt and his dtr in room and discussed with them some of the 5 P's of energy conservation. No further acute OT needs at this time, HHOT has been recommended so as to make sure pt is able to manage all of his ADLs in his own apartment at [facility] independent living. Acute OT will sign off with pt D/C'ing today.      If plan is discharge home, recommend the following:  A little help with walking and/or transfers;A little help with bathing/dressing/bathroom;Assistance with cooking/housework;Assist for transportation;Help with stairs or ramp for entrance   Equipment Recommendations  None recommended by OT       Precautions / Restrictions Precautions Precautions: Fall Restrictions Weight Bearing Restrictions Per Provider Order: No       Mobility Bed Mobility               General bed mobility comments: pt OOB upon my arrival           ADL either performed or assessed with clinical judgement   ADL                                         General ADL Comments: Pt was finishing up gettng himself dressed as I entered the room. Provided pt and dtr with copies of the 5 P's of energy conservation and discussed some of these with them. Pt and dtr very appreciative of the information and pt states that he knows things are going to change as he goes through radiation and chemo and he will have to adjust/adapt.    Extremity/Trunk Assessment Upper Extremity Assessment Upper Extremity Assessment:  Overall WFL for tasks assessed            Vision Patient Visual Report: No change from baseline               Cognition Arousal: Alert Behavior During Therapy: WFL for tasks assessed/performed                                 Following commands: Intact        Cueing   Cueing Techniques: Verbal cues                Pertinent Vitals/ Pain       Pain Assessment Pain Assessment: No/denies pain         Frequency  Min 1X/week        Progress Toward Goals  OT Goals(current goals can now be found in the care plan section)  Progress towards OT goals: Progressing toward goals  Acute Rehab OT Goals Patient Stated Goal: to go home         AM-PAC OT 6 Clicks Daily Activity     Outcome Measure   Help from another person eating meals?: None Help from another person taking care of personal grooming?: A Little Help from another person toileting, which  includes using toliet, bedpan, or urinal?: A Little Help from another person bathing (including washing, rinsing, drying)?: A Little Help from another person to put on and taking off regular upper body clothing?: A Little Help from another person to put on and taking off regular lower body clothing?: A Little 6 Click Score: 19    End of Session    OT Visit Diagnosis: Muscle weakness (generalized) (M62.81)   Activity Tolerance Patient tolerated treatment well   Patient Left  (sitting in recliner awaiting RN to come get him and take him down in wheelchair to meet his dtr)           Time: 8870-8854 OT Time Calculation (min): 16 min  Charges: OT General Charges $OT Visit: 1 Visit OT Treatments $Self Care/Home Management : 8-22 mins  Donny BECKER OT Acute Rehabilitation Services Office [phone]    [place] Dorothyann Distel 09/02/2023, 12:21 PM

## 2023-09-02 NOTE — Plan of Care (Signed)
 Patient and patient's family member given and educated on discharge instructions. Patient's PIV removed. Patient's belongings given to patient prior to discharging from the unit. Patient brought to the main entrance by wheelchair.  Problem: Education: Goal: Knowledge of General Education information will improve Description: Including pain rating scale, medication(s)/side effects and non-pharmacologic comfort measures Outcome: Completed/Met   Problem: Health Behavior/Discharge Planning: Goal: Ability to manage health-related needs will improve Outcome: Completed/Met   Problem: Clinical Measurements: Goal: Ability to maintain clinical measurements within normal limits will improve Outcome: Completed/Met Goal: Will remain free from infection Outcome: Completed/Met Goal: Diagnostic test results will improve Outcome: Completed/Met Goal: Respiratory complications will improve Outcome: Completed/Met Goal: Cardiovascular complication will be avoided Outcome: Completed/Met   Problem: Activity: Goal: Risk for activity intolerance will decrease Outcome: Completed/Met   Problem: Nutrition: Goal: Adequate nutrition will be maintained Outcome: Completed/Met   Problem: Coping: Goal: Level of anxiety will decrease Outcome: Completed/Met   Problem: Elimination: Goal: Will not experience complications related to bowel motility Outcome: Completed/Met Goal: Will not experience complications related to urinary retention Outcome: Completed/Met   Problem: Pain Managment: Goal: General experience of comfort will improve and/or be controlled Outcome: Completed/Met   Problem: Safety: Goal: Ability to remain free from injury will improve Outcome: Completed/Met   Problem: Skin Integrity: Goal: Risk for impaired skin integrity will decrease Outcome: Completed/Met

## 2023-09-04 ENCOUNTER — Telehealth (HOSPITAL_COMMUNITY): Payer: Self-pay | Admitting: Pharmacy Technician

## 2023-09-04 ENCOUNTER — Other Ambulatory Visit: Payer: Self-pay

## 2023-09-04 ENCOUNTER — Telehealth (HOSPITAL_COMMUNITY): Payer: Self-pay

## 2023-09-04 ENCOUNTER — Other Ambulatory Visit (HOSPITAL_COMMUNITY): Payer: Self-pay | Admitting: Family Medicine

## 2023-09-04 DIAGNOSIS — G250 Essential tremor: Secondary | ICD-10-CM

## 2023-09-04 MED ORDER — PRIMIDONE 250 MG PO TABS
250.0000 mg | ORAL_TABLET | Freq: Two times a day (BID) | ORAL | 2 refills | Status: DC
Start: 2023-09-04 — End: 2023-12-06

## 2023-09-04 NOTE — Telephone Encounter (Signed)
 Auth Submission: NO AUTH NEEDED Site of care: [facility] Payer: Medicare A/B, BCBS  Medication & CPT/J Code(s) submitted: Feraheme (ferumoxytol) [id number] Diagnosis Code: D50.9, R04.2 Route of submission (phone, fax, portal):  Phone # Fax # Auth type: Buy/Bill HB Units/visits requested: 510mg  x 2 doses  Reference number:  Approval from: 09/04/23 to 02/17/24    Dagoberto Armour, CPhT [facility] Phone: [phone] 09/04/2023

## 2023-09-04 NOTE — Telephone Encounter (Signed)
 Patient referred to infusion pharmacy team for ambulatory infusion of IV iron.  Insurance - Medicare Site of care - Site of care: [facility] Dx code - D50.9/R04.2 IV Iron Therapy - Feraheme 510 mg x 2 Infusion appointments - Scheduling team will schedule patient as soon as possible.   Thank you,  Norton Blush, PharmD [facility]

## 2023-09-05 ENCOUNTER — Inpatient Hospital Stay: Attending: Internal Medicine | Admitting: Licensed Clinical Social Worker

## 2023-09-05 DIAGNOSIS — C3412 Malignant neoplasm of upper lobe, left bronchus or lung: Secondary | ICD-10-CM | POA: Insufficient documentation

## 2023-09-05 DIAGNOSIS — Z87891 Personal history of nicotine dependence: Secondary | ICD-10-CM | POA: Diagnosis not present

## 2023-09-05 DIAGNOSIS — D472 Monoclonal gammopathy: Secondary | ICD-10-CM

## 2023-09-05 DIAGNOSIS — Z452 Encounter for adjustment and management of vascular access device: Secondary | ICD-10-CM | POA: Diagnosis not present

## 2023-09-05 DIAGNOSIS — I130 Hypertensive heart and chronic kidney disease with heart failure and stage 1 through stage 4 chronic kidney disease, or unspecified chronic kidney disease: Secondary | ICD-10-CM | POA: Insufficient documentation

## 2023-09-05 DIAGNOSIS — Z510 Encounter for antineoplastic radiation therapy: Secondary | ICD-10-CM | POA: Diagnosis not present

## 2023-09-05 DIAGNOSIS — D631 Anemia in chronic kidney disease: Secondary | ICD-10-CM | POA: Diagnosis not present

## 2023-09-05 DIAGNOSIS — I5032 Chronic diastolic (congestive) heart failure: Secondary | ICD-10-CM | POA: Insufficient documentation

## 2023-09-05 DIAGNOSIS — Z01818 Encounter for other preprocedural examination: Secondary | ICD-10-CM | POA: Diagnosis not present

## 2023-09-05 DIAGNOSIS — N189 Chronic kidney disease, unspecified: Secondary | ICD-10-CM | POA: Diagnosis not present

## 2023-09-05 NOTE — Progress Notes (Signed)
 [facility] Clinical Social Work  Initial Assessment   [name] is a 73 y.o. year old male , details for assessment obtained from pt's daughter. Clinical Social Work was referred by medical provider for assessment of psychosocial needs.   SDOH (Social Determinants of Health) assessments performed: Yes   SDOH Screenings   Food Insecurity: No Food Insecurity (08/28/2023)  Housing: Low Risk  (08/28/2023)  Transportation Needs: No Transportation Needs (08/28/2023)  Utilities: Not At Risk (08/28/2023)  Alcohol  Screen: Low Risk  (02/02/2019)  Depression (PHQ2-9): Medium Risk (06/29/2021)  Social Connections: Moderately Isolated (08/28/2023)  Tobacco Use: Medium Risk (08/27/2023)    PHQ 2/9:    06/29/2021    1:34 PM 12/29/2020    9:28 AM 09/24/2020    9:49 AM  Depression screen PHQ 2/9  Decreased Interest     Down, Depressed, Hopeless     PHQ - 2 Score     Altered sleeping     Tired, decreased energy     Change in appetite     Feeling bad or failure about yourself      Trouble concentrating     Moving slowly or fidgety/restless     Suicidal thoughts     PHQ-9 Score     Difficult doing work/chores        Information is confidential and restricted. Go to Review Flowsheets to unlock data.     Distress Screen completed: No     No data to display            Family/Social Information:  Housing Arrangement: patient lives alone at an independent living community.  The community does offer transitions to assisted living and skilled but there is presently a wait list for assisted living.  Pt is independent in ADLs, but does have memory issues due to previous TIAs which causes issues with medication compliance at times.  Pt's medication is mailed directly from the pharmacy and organized for him; however, PRN medications can not be managed by pt. Family members/support persons in your life? Pt has only 1 daughter and she is local. Transportation concerns: pt drives himself, but may need  transportation assistance if he does not tolerate treatment well  Employment: Retired .  Income source: Actor concerns: Not presently, but if assistance for pt in the home needs to be privately hired concerns may arise. Type of concern: None Food access concerns: no Religious or spiritual practice: Not known Advanced directives: Yes Services Currently in place:  none  Coping/ Adjustment to diagnosis: Patient understands treatment plan and what happens next? yes Concerns about diagnosis and/or treatment: Overwhelmed by information, How will I care for myself, and Quality of life Patient reported stressors: Anxiety/ nervousness and Adjusting to my illness Hopes and/or priorities: pt's priority is to start treatment w/ the hope of positive results Patient enjoys time with family/ friends Current coping skills/ strengths: Motivation for treatment/growth , Physical Health , and Supportive family/friends     SUMMARY: Current SDOH Barriers:  No barriers identified at this time.  Clinical Social Work Clinical Goal(s):  No clinical social work goals at this time  Interventions: Discussed common feeling and emotions when being diagnosed with cancer, and the importance of support during treatment Informed patient of the support team roles and support services at [facility] Provided CSW contact information and encouraged patient to call with any questions or concerns Provided pt's daughter with contact information for [facility] to explore additional options regarding  supportive care for pt if needed.  CSW to meet w/ pt in person at medical appointment.   Follow Up Plan: CSW will see patient on next medical appointment Patient verbalizes understanding of plan: Yes    Devere JONELLE Manna, LCSW Clinical Social Worker [facility]

## 2023-09-05 NOTE — Telephone Encounter (Signed)
 Daughter states that patient will be receiving radiation at [facility] and wanted to see if he could get iron infusion there due to transportation issues. [name] is coordinating with [facility] to have patient scheduled for Iron infusions.

## 2023-09-06 ENCOUNTER — Encounter

## 2023-09-06 ENCOUNTER — Ambulatory Visit
Admission: RE | Admit: 2023-09-06 | Discharge: 2023-09-06 | Disposition: A | Discharge status: Home / Self Care | Payer: Self-pay | Source: Ambulatory Visit | Attending: Radiation Oncology | Admitting: Radiation Oncology

## 2023-09-06 ENCOUNTER — Other Ambulatory Visit: Payer: Self-pay

## 2023-09-06 DIAGNOSIS — Z510 Encounter for antineoplastic radiation therapy: Secondary | ICD-10-CM | POA: Diagnosis not present

## 2023-09-06 LAB — RAD ONC ARIA SESSION SUMMARY
Course Elapsed Days: 0
Plan Fractions Treated to Date: 1
Plan Prescribed Dose Per Fraction: 2 Gy
Plan Total Fractions Prescribed: 33
Plan Total Prescribed Dose: 66 Gy
Reference Point Dosage Given to Date: 2 Gy
Reference Point Session Dosage Given: 2 Gy
Session Number: 1

## 2023-09-07 ENCOUNTER — Other Ambulatory Visit: Payer: Self-pay | Admitting: Medical Oncology

## 2023-09-07 ENCOUNTER — Inpatient Hospital Stay: Admitting: Internal Medicine

## 2023-09-07 ENCOUNTER — Inpatient Hospital Stay (HOSPITAL_BASED_OUTPATIENT_CLINIC_OR_DEPARTMENT_OTHER)

## 2023-09-07 ENCOUNTER — Inpatient Hospital Stay

## 2023-09-07 ENCOUNTER — Ambulatory Visit
Admission: RE | Admit: 2023-09-07 | Discharge: 2023-09-07 | Disposition: A | Discharge status: Home / Self Care | Payer: Self-pay | Source: Ambulatory Visit | Attending: Radiation Oncology | Admitting: Radiation Oncology

## 2023-09-07 ENCOUNTER — Other Ambulatory Visit: Payer: Self-pay

## 2023-09-07 ENCOUNTER — Inpatient Hospital Stay: Admitting: Licensed Clinical Social Worker

## 2023-09-07 VITALS — BP 126/72 | HR 73 | Temp 98.0°F | Resp 17 | Ht 67.0 in | Wt 193.0 lb

## 2023-09-07 DIAGNOSIS — D472 Monoclonal gammopathy: Secondary | ICD-10-CM

## 2023-09-07 DIAGNOSIS — C3412 Malignant neoplasm of upper lobe, left bronchus or lung: Secondary | ICD-10-CM

## 2023-09-07 DIAGNOSIS — Z510 Encounter for antineoplastic radiation therapy: Secondary | ICD-10-CM | POA: Diagnosis not present

## 2023-09-07 LAB — CMP (CANCER CENTER ONLY)
ALT: 16 U/L (ref 0–44)
AST: 19 U/L (ref 15–41)
Albumin: 4.3 g/dL (ref 3.5–5.0)
Alkaline Phosphatase: 59 U/L (ref 38–126)
Anion gap: 7 (ref 5–15)
BUN: 15 mg/dL (ref 8–23)
CO2: 28 mmol/L (ref 22–32)
Calcium: 8.9 mg/dL (ref 8.9–10.3)
Chloride: 105 mmol/L (ref 98–111)
Creatinine: 1.49 mg/dL — ABNORMAL HIGH (ref 0.61–1.24)
GFR, Estimated: 50 mL/min — ABNORMAL LOW (ref >60)
Glucose, Bld: 95 mg/dL (ref 70–99)
Potassium: 3.5 mmol/L (ref 3.5–5.1)
Sodium: 140 mmol/L (ref 135–145)
Total Bilirubin: 0.3 mg/dL (ref 0.0–1.2)
Total Protein: 7.2 g/dL (ref 6.5–8.1)

## 2023-09-07 LAB — RAD ONC ARIA SESSION SUMMARY
Course Elapsed Days: 1
Plan Fractions Treated to Date: 2
Plan Prescribed Dose Per Fraction: 2 Gy
Plan Total Fractions Prescribed: 33
Plan Total Prescribed Dose: 66 Gy
Reference Point Dosage Given to Date: 4 Gy
Reference Point Session Dosage Given: 2 Gy
Session Number: 2

## 2023-09-07 LAB — CBC WITH DIFFERENTIAL (CANCER CENTER ONLY)
Abs Immature Granulocytes: 0.02 K/uL (ref 0.00–0.07)
Basophils Absolute: 0 K/uL (ref 0.0–0.1)
Basophils Relative: 1 %
Eosinophils Absolute: 0.2 K/uL (ref 0.0–0.5)
Eosinophils Relative: 3 %
HCT: 26.5 % — ABNORMAL LOW (ref 39.0–52.0)
Hemoglobin: 9 g/dL — ABNORMAL LOW (ref 13.0–17.0)
Immature Granulocytes: 0 %
Lymphocytes Relative: 21 %
Lymphs Abs: 1.4 K/uL (ref 0.7–4.0)
MCH: 32.3 pg (ref 26.0–34.0)
MCHC: 34 g/dL (ref 30.0–36.0)
MCV: 95 fL (ref 80.0–100.0)
Monocytes Absolute: 0.6 K/uL (ref 0.1–1.0)
Monocytes Relative: 9 %
Neutro Abs: 4.4 K/uL (ref 1.7–7.7)
Neutrophils Relative %: 66 %
Platelet Count: 473 K/uL — ABNORMAL HIGH (ref 150–400)
RBC: 2.79 MIL/uL — ABNORMAL LOW (ref 4.22–5.81)
RDW: 12.7 % (ref 11.5–15.5)
WBC Count: 6.5 K/uL (ref 4.0–10.5)
nRBC: 0 % (ref 0.0–0.2)

## 2023-09-07 LAB — SAMPLE TO BLOOD BANK

## 2023-09-07 NOTE — Progress Notes (Unsigned)
  [facility] [phone]   [phone]  CONSULT NOTE  REFERRING PHYSICIAN:  REASON FOR CONSULTATION:  ***  HPI [name] is a 73 y.o. male.  *** HPI  Discussed the use of AI scribe software for clinical note transcription with the patient, who gave verbal consent to proceed.  History of Present Illness      Past Medical History:  Diagnosis Date  . (HFpEF) heart failure with preserved ejection fraction (HCC)   . Allergy   . Anxiety   . Arthritis   . Chronic kidney disease   . Depression   . Frequent urination   . GERD (gastroesophageal reflux disease)   . Gout   . Headache    mirgaines in the past  . Herpes   . History of nuclear stress test    Myoview  05/2021: EF 77, normal perfusion, low risk  . Hyperlipidemia   . Hypertension   . IBS (irritable bowel syndrome)   . Memory loss   . MGUS (monoclonal gammopathy of unknown significance)   . Obesity   . Pre-diabetes   . Stroke (HCC)   . Urgency of urination       Past Surgical History:  Procedure Laterality Date  . CHOLECYSTECTOMY N/A 12/17/2019   Procedure: LAPAROSCOPIC CHOLECYSTECTOMY WITH ICG INJECTION;  Surgeon: Ebbie Cough, MD;  Location: [facility] OR;  Service: General;  Laterality: N/A;  . disckec    . shouler arthroscopy Left   . SPINAL FUSION    . TONSILLECTOMY    . TOTAL KNEE ARTHROPLASTY Right 11/15/2021   Procedure: TOTAL KNEE ARTHROPLASTY;  Surgeon: Melodi Lerner, MD;  Location: [facility] ORS;  Service: Orthopedics;  Laterality: Right;  SABRA VIDEO BRONCHOSCOPY WITH ENDOBRONCHIAL NAVIGATION Left 08/21/2023   Procedure: VIDEO BRONCHOSCOPY WITH ENDOBRONCHIAL NAVIGATION;  Surgeon: Shelah Lamar RAMAN, MD;  Location: [facility] ENDOSCOPY;  Service: Pulmonary;  Laterality: Left;  SABRA VIDEO BRONCHOSCOPY WITH ENDOBRONCHIAL ULTRASOUND  08/21/2023   Procedure: BRONCHOSCOPY, WITH EBUS;  Surgeon: Shelah Lamar RAMAN, MD;  Location: [facility] ENDOSCOPY;  Service: Pulmonary;;  . VIDEO BRONCHOSCOPY WITH  RADIAL ENDOBRONCHIAL ULTRASOUND  08/21/2023   Procedure: VIDEO BRONCHOSCOPY WITH RADIAL ENDOBRONCHIAL ULTRASOUND;  Surgeon: Shelah Lamar RAMAN, MD;  Location: [facility] ENDOSCOPY;  Service: Pulmonary;;  . WISDOM TOOTH EXTRACTION      Family History  Problem Relation Age of Onset  . Hypertension Mother   . Hypertension Father   . Hypertension Sister   . Hypertension Brother   . Cancer Maternal Aunt   . Cancer - Ovarian Maternal Grandmother   . Diabetes Mellitus I Paternal Grandmother   . Hypertension Paternal Grandfather   . Cancer Maternal Aunt     Social History Social History   Tobacco Use  . Smoking status: Former    Current packs/day: 0.00    Types: Cigarettes    Quit date: 1990    Years since quitting: 35.6  . Smokeless tobacco: Never  Vaping Use  . Vaping status: Never Used  Substance Use Topics  . Alcohol  use: No    Alcohol /week: 1.0 standard drink of alcohol     Types: 1 Cans of beer per week  . Drug use: No    Allergies  Allergen Reactions  . Divalproex Sodium Hives  . Lamotrigine Diarrhea and Other (See Comments)    Shakes and unsteadiness    . Valproic Acid Hives and Other (See Comments)    Shakes GI problems, too  . Amitriptyline Other (See Comments)    Reaction  not known  . Aspirin  Nausea And Vomiting and Other (See Comments)    Dizziness, weakness and balance issues, and sweats too  . Hydrocodone  Nausea And Vomiting  . Lisinopril Cough        . Lithium Diarrhea and Other (See Comments)    Falls over and shakes   . Morphine  Other (See Comments)     'not tolerated well  . Nsaids Other (See Comments)    Kidney disease  . Oxycodone Nausea And Vomiting  . Oxycodone-Acetaminophen  Nausea And Vomiting and Other (See Comments)    Also causes dizziness  . Pork Allergy Nausea Only    Current Outpatient Medications  Medication Sig Dispense Refill  . acetaminophen  (TYLENOL ) 500 MG tablet Take 1-2 tablets (500-1,000 mg total) by mouth every 6 (six) hours as  needed for moderate pain. 30 tablet 0  . allopurinol  (ZYLOPRIM ) 100 MG tablet Take 150 mg by mouth daily.    . amLODipine  (NORVASC ) 10 MG tablet Take 1 tablet (10 mg total) by mouth daily. 90 tablet 3  . benzonatate  (TESSALON ) 200 MG capsule Take 1 capsule (200 mg total) by mouth in the morning and at bedtime. 60 capsule 2  . busPIRone  (BUSPAR ) 30 MG tablet Take 30 mg by mouth 2 (two) times daily.    . cetirizine  (ZYRTEC  ALLERGY) 10 MG tablet Take 1 tablet (10 mg total) by mouth daily. (Patient taking differently: Take 10 mg by mouth at bedtime.) 30 tablet 0  . clindamycin (CLEOCIN) 300 MG capsule Take 300 mg by mouth 3 (three) times daily.    . [Paused] clopidogrel  (PLAVIX ) 75 MG tablet Take 1 tablet (75 mg total) by mouth daily. Okay to restart this medication on 08/22/2023.    . dextromethorphan  (DELSYM ) 30 MG/5ML liquid Take 5 mLs (30 mg total) by mouth 2 (two) times daily as needed for cough. 89 mL 0  . fluticasone  (FLONASE ) 50 MCG/ACT nasal spray Place 1 spray into both nostrils daily. 16 g 2  . folic acid  (FOLVITE ) 1 MG tablet Take 1 mg by mouth daily.    . furosemide  (LASIX ) 40 MG tablet Take 1 tablet (40 mg total) by mouth daily. 90 tablet 3  . [Paused] hydrALAZINE  (APRESOLINE ) 50 MG tablet Take 1.5 tablets (75 mg total) by mouth 2 (two) times daily. 270 tablet 3  . HYDROcodone  bit-homatropine (HYCODAN) 5-1.5 MG/5ML syrup Take 5 mLs by mouth every 4 (four) hours as needed (Persistent cough). 120 mL 0  . Hydroxychloroquine  Sulfate 400 MG TABS Take 400 mg by mouth at bedtime.    . Lactobacillus (ACIDOPHILUS) CAPS capsule Take 1 capsule by mouth daily. (Patient taking differently: Take 1 capsule by mouth in the morning.) 100 capsule 0  . lidocaine  (LIDODERM ) 5 % Place 1 patch onto the skin daily as needed (for pain- Remove & Discard patch within 12 hours or as directed by MD).    . PARoxetine  (PAXIL -CR) 37.5 MG 24 hr tablet Take 1 tablet (37.5 mg total) by mouth at bedtime.    . primidone   (MYSOLINE ) 250 MG tablet Take 1 tablet (250 mg total) by mouth 2 (two) times daily. 60 tablet 2  . QUEtiapine  (SEROQUEL ) 25 MG tablet Take 25 mg by mouth at bedtime.    . rosuvastatin  (CRESTOR ) 40 MG tablet Take 40 mg by mouth daily.    . traZODone  (DESYREL ) 100 MG tablet Take 100 mg by mouth at bedtime.    . valACYclovir  (VALTREX ) 1000 MG tablet Take 1,000 mg by mouth daily.    SABRA  Vitamin D , Ergocalciferol , (DRISDOL ) 1.25 MG (50000 UNIT) CAPS capsule Take 50,000 Units by mouth every Saturday.     No current facility-administered medications for this visit.    Review of Systems  {Ros - complete:30496}  Physical Exam  RAL:{CHL ONC PE GENERAL:[phone]} SKIN: {CHL ONC PE DXPW:8845999797} HEAD: {CHL ONC PE YZJI:8845999796} EYES: {CHL ONC PE ZBZD:8845999795} EARS: {CHL ONC PE ZJMD:8845999794} OROPHARYNX:{CHL ONC PE OROPHARYNX:[id number]}  NECK: {CHL ONC PE WZRX:8845999792} LYMPH:  {CHL ONC PE OBFEY:8845999791} BREAST:{CHL ONC PE BREAST:[phone]} LUNGS: {CHL ONC PE OLWHD:8845999790} HEART: {CHL ONC PE YZJMU:8845999788} ABDOMEN:{CHL ONC PE ABDOMEN:[id number]} BACK: {CHL ONC PE AJRX:8845999786} EXTREMITIES:{CHL ONC PE EXTREMITIES:[id number]}  NEURO: {CHL ONC PE NEURO:8651429309}  PERFORMANCE STATUS: ECOG ***  LABORATORY DATA: Lab Results  Component Value Date   WBC 6.5 09/07/2023   HGB 9.0 (L) 09/07/2023   HCT 26.5 (L) 09/07/2023   MCV 95.0 09/07/2023   PLT 473 (H) 09/07/2023      Chemistry      Component Value Date/Time   NA 141 08/31/2023 0829   NA 140 07/27/2023 1454   K 3.7 08/31/2023 0829   CL 112 (H) 08/31/2023 0829   CO2 22 08/31/2023 0829   BUN 18 08/31/2023 0829   BUN 15 07/27/2023 1454   CREATININE 1.42 (H) 08/31/2023 0829   CREATININE 1.43 (H) 11/18/2022 1112      Component Value Date/Time   CALCIUM  8.3 (L) 08/31/2023 0829   ALKPHOS 51 08/27/2023 2357   AST 22 08/27/2023 2357   AST 20 11/18/2022 1112   ALT 17 08/27/2023 2357   ALT 16 11/18/2022  1112   BILITOT 0.5 08/27/2023 2357   BILITOT 0.3 11/18/2022 1112       RADIOGRAPHIC STUDIES: DG Chest Port 1 View Result Date: 08/29/2023 CLINICAL DATA:  Shortness of breath. EXAM: PORTABLE CHEST 1 VIEW COMPARISON:  Chest CTA 08/27/2023 chest radiograph 08/27/2023 FINDINGS: Again noted is the mass in the left upper lobe/left suprahilar region. Hazy densities in the left lung most likely associated with some volume loss. Right lung remains clear. Heart size is stable and within normal limits. Trachea is midline. Negative for a pneumothorax. No acute bone abnormality. IMPRESSION: 1. Persistent mass in the left upper lobe. 2. Hazy densities in the left lung are most compatible with volume loss. Electronically Signed   By: Juliene Balder M.D.   On: 08/29/2023 08:28   MR BRAIN W WO CONTRAST Result Date: 08/28/2023 CLINICAL DATA:  Metastatic disease evaluation EXAM: MRI HEAD WITHOUT AND WITH CONTRAST TECHNIQUE: Multiplanar, multiecho pulse sequences of the brain and surrounding structures were obtained without and with intravenous contrast. CONTRAST:  8mL GADAVIST  GADOBUTROL  1 MMOL/ML IV SOLN COMPARISON:  None Available. FINDINGS: MRI brain: There are multiple foci of encephalomalacia in the periventricular white matter of both hemispheres, left more than right with corpus callosal atrophy. There is no abnormal enhancement. No evidence of metastatic disease. There is no acute or chronic infarct. The ventricles are normal. No mass lesion. There are normal flow signals in the carotid arteries and basilar artery. No significant bone marrow signal abnormality. No significant abnormality in the paranasal sinuses or soft tissues. IMPRESSION: 1. There is no evidence of metastatic disease 2. There are multiple periventricular foci of encephalomalacia, left hemisphere more than the right, with atrophy of the corpus callosum. The pattern of disease is suggestive of chronic demyelination Electronically Signed   By: Nancyann Burns M.D.   On: 08/28/2023 11:54   CT Angio Chest PE W and/or Wo Contrast  Result Date: 08/27/2023 CLINICAL DATA:  Hemoptysis.  Lung mass.  * Tracking Code: BO * EXAM: CT ANGIOGRAPHY CHEST WITH CONTRAST TECHNIQUE: Multidetector CT imaging of the chest was performed using the standard protocol during bolus administration of intravenous contrast. Multiplanar CT image reconstructions and MIPs were obtained to evaluate the vascular anatomy. RADIATION DOSE REDUCTION: This exam was performed according to the departmental dose-optimization program which includes automated exposure control, adjustment of the mA and/or kV according to patient size and/or use of iterative reconstruction technique. CONTRAST:  75mL OMNIPAQUE  IOHEXOL  350 MG/ML SOLN COMPARISON:  PET 07/26/2023 and CT chest 06/30/2023. FINDINGS: Cardiovascular: Negative for pulmonary embolus. Atherosclerotic calcification of the aorta. Heart is at the upper limits of normal in size to mildly enlarged. No pericardial effusion. Enlarged pulmonic trunk. Mediastinum/Nodes: Left hilar adenopathy measures up to 1.5 cm. No pathologically enlarged mediastinal or axillary lymph nodes. Air in the esophagus can be seen with dysmotility. Lungs/Pleura: Left upper lobe mass measures approximately 5.0 x 5.6 cm, stable to minimally enlarged from 4.5 x 5.2 cm on 06/30/2023. Long border of contact with the adjacent mediastinum with extension to the left hilum. 10 mm ground-glass nodule in the posterolateral right lower lobe (12/77), unchanged. Newly obstructed superior segmental left lower lobe bronchus with mild postobstructive ground-glass nodularity in the left lower lobe. No pleural fluid. Airway is otherwise unremarkable. Upper Abdomen: Hepatic cysts. Cholecystectomy. Low-attenuation lesions in the kidneys. No specific follow-up necessary. Elevated left hemidiaphragm. Visualized portions of the liver, adrenal glands, kidneys, spleen, pancreas, stomach and bowel are otherwise  grossly unremarkable. No upper abdominal adenopathy. Musculoskeletal: Degenerative changes in the spine. Review of the MIP images confirms the above findings. IMPRESSION: 1. Negative for pulmonary embolus. 2. Similar to minimally enlarged left upper lobe mass with left hilar adenopathy, compatible with primary bronchogenic carcinoma. 3. New obstruction of the superior segmental left lower lobe bronchus with postobstructive ground-glass nodularity in the left lower lobe. 4. Stable 10 mm ground-glass right lower lobe nodule. Recommend attention on follow-up. 5.  Aortic atherosclerosis (ICD10-I70.0). 6. Electronically Signed   By: Newell Eke M.D.   On: 08/27/2023 17:27   DG Chest Port 1 View Result Date: 08/27/2023 CLINICAL DATA:  Hemoptysis.  Recent bronchoscopy with biopsy. EXAM: PORTABLE CHEST 1 VIEW COMPARISON:  Radiograph 08/21/2023.  PET CT 07/26/2023 FINDINGS: Unchanged mild volume loss in the left hemithorax with left suprahilar opacity corresponding to hypermetabolic mass on PET-CT. No pneumothorax. Stable heart size and mediastinal contours. No new airspace disease. No pulmonary edema. No pleural effusion. IMPRESSION: Unchanged mild volume loss in the left hemithorax with left suprahilar opacity corresponding to hypermetabolic mass on PET-CT. No pneumothorax or new findings. Electronically Signed   By: Andrea Gasman M.D.   On: 08/27/2023 14:05   DG Chest Port 1 View Result Date: 08/21/2023 CLINICAL DATA:  Post bronchoscopy with biopsy. EXAM: PORTABLE CHEST 1 VIEW COMPARISON:  06/30/2023 FINDINGS: Again noted is the left upper lobe suprahilar mass, unchanged since prior study. No pneumothorax following bronchoscopy. Right lung clear. No effusions. Heart is normal size. No acute bony abnormality. IMPRESSION: No pneumothorax following bronchoscopy. Electronically Signed   By: Franky Crease M.D.   On: 08/21/2023 12:40   DG C-ARM BRONCHOSCOPY Result Date: 08/21/2023 C-ARM BRONCHOSCOPY: Fluoroscopy  was utilized by the requesting physician.  No radiographic interpretation.    ASSESSMENT:   PLAN: Assessment and Plan Assessment & Plan      The patient voices understanding of current disease status and treatment options and is in  agreement with the current care plan.  All questions were answered. The patient knows to call the clinic with any problems, questions or concerns. We can certainly see the patient much sooner if necessary.  Thank you so much for allowing me to participate in the care of Dreyden L Vanderveer. I will continue to follow up the patient with you and assist in his care.  I spent {CHL ONC TIME VISIT - DTPQU:8845999869} counseling the patient face to face. The total time spent in the appointment was {CHL ONC TIME VISIT - DTPQU:8845999869}.  Disclaimer: This note was dictated with voice recognition software. Similar sounding words can inadvertently be transcribed and may not be corrected upon review.   Sherrod POUR Tashyra Adduci September 07, 2023, 3:00 PM

## 2023-09-08 ENCOUNTER — Telehealth: Payer: Self-pay | Admitting: Internal Medicine

## 2023-09-08 ENCOUNTER — Ambulatory Visit
Admission: RE | Admit: 2023-09-08 | Discharge: 2023-09-08 | Disposition: A | Discharge status: Home / Self Care | Payer: Self-pay | Source: Ambulatory Visit | Attending: Radiation Oncology | Admitting: Radiation Oncology

## 2023-09-08 ENCOUNTER — Other Ambulatory Visit: Payer: Self-pay

## 2023-09-08 ENCOUNTER — Encounter: Payer: Self-pay | Admitting: Internal Medicine

## 2023-09-08 ENCOUNTER — Encounter

## 2023-09-08 DIAGNOSIS — Z510 Encounter for antineoplastic radiation therapy: Secondary | ICD-10-CM | POA: Diagnosis not present

## 2023-09-08 LAB — RAD ONC ARIA SESSION SUMMARY
Course Elapsed Days: 2
Plan Fractions Treated to Date: 3
Plan Prescribed Dose Per Fraction: 2 Gy
Plan Total Fractions Prescribed: 33
Plan Total Prescribed Dose: 66 Gy
Reference Point Dosage Given to Date: 6 Gy
Reference Point Session Dosage Given: 2 Gy
Session Number: 3

## 2023-09-08 MED ORDER — ONDANSETRON HCL 8 MG PO TABS: 8.0000 mg | ORAL_TABLET | Freq: Three times a day (TID) | ORAL | 1 refills | Status: DC | PRN

## 2023-09-08 MED ORDER — LIDOCAINE-PRILOCAINE 2.5-2.5 % EX CREA: TOPICAL_CREAM | CUTANEOUS | 3 refills | Status: DC

## 2023-09-08 MED ORDER — PROCHLORPERAZINE MALEATE 10 MG PO TABS: 10.0000 mg | ORAL_TABLET | Freq: Four times a day (QID) | ORAL | 1 refills | Status: DC | PRN

## 2023-09-08 NOTE — Telephone Encounter (Signed)
 Daughter is calling to speak with the nurse about what's next for the patient and how to move his appt around due to his chemo treatments. Please advise

## 2023-09-08 NOTE — Progress Notes (Signed)
 START ON PATHWAY REGIMEN - Non-Small Cell Lung     A cycle is every 7 days, concurrent with RT:     Paclitaxel      Carboplatin   **Always confirm dose/schedule in your pharmacy ordering system**  Patient Characteristics: Preoperative or Nonsurgical Candidate (Clinical Staging), Stage IIB (N2a only) or Stage III - Nonsurgical Candidate, PS = 0,1 Therapeutic Status: Preoperative or Nonsurgical Candidate (Clinical Staging) AJCC T Category: cT3 AJCC N Category: cN1 AJCC M Category: cM0 AJCC 9 Stage Grouping: IIIA Check here if patient was staged using an edition other than AJCC Staging 9th Edition: false ECOG Performance Status: 1 Intent of Therapy: Curative Intent, Discussed with Patient

## 2023-09-10 ENCOUNTER — Other Ambulatory Visit: Payer: Self-pay

## 2023-09-11 ENCOUNTER — Other Ambulatory Visit: Payer: Self-pay

## 2023-09-11 ENCOUNTER — Inpatient Hospital Stay

## 2023-09-11 ENCOUNTER — Ambulatory Visit
Admission: RE | Admit: 2023-09-11 | Discharge: 2023-09-11 | Disposition: A | Discharge status: Home / Self Care | Source: Ambulatory Visit | Attending: Radiation Oncology | Admitting: Radiation Oncology

## 2023-09-11 ENCOUNTER — Inpatient Hospital Stay
Admission: RE | Admit: 2023-09-11 | Discharge: 2023-09-11 | Disposition: A | Discharge status: Home / Self Care | Payer: Self-pay | Source: Ambulatory Visit | Attending: Radiation Oncology | Admitting: Radiation Oncology

## 2023-09-11 ENCOUNTER — Telehealth: Payer: Self-pay | Admitting: *Deleted

## 2023-09-11 ENCOUNTER — Other Ambulatory Visit: Payer: Self-pay | Admitting: Medical Oncology

## 2023-09-11 ENCOUNTER — Other Ambulatory Visit: Payer: Self-pay | Admitting: Internal Medicine

## 2023-09-11 ENCOUNTER — Other Ambulatory Visit: Payer: Self-pay | Admitting: Radiology

## 2023-09-11 ENCOUNTER — Encounter: Payer: Self-pay | Admitting: Internal Medicine

## 2023-09-11 DIAGNOSIS — D509 Iron deficiency anemia, unspecified: Secondary | ICD-10-CM | POA: Insufficient documentation

## 2023-09-11 DIAGNOSIS — Z510 Encounter for antineoplastic radiation therapy: Secondary | ICD-10-CM | POA: Diagnosis not present

## 2023-09-11 DIAGNOSIS — D508 Other iron deficiency anemias: Secondary | ICD-10-CM

## 2023-09-11 LAB — RAD ONC ARIA SESSION SUMMARY
Course Elapsed Days: 5
Plan Fractions Treated to Date: 4
Plan Prescribed Dose Per Fraction: 2 Gy
Plan Total Fractions Prescribed: 33
Plan Total Prescribed Dose: 66 Gy
Reference Point Dosage Given to Date: 8 Gy
Reference Point Session Dosage Given: 2 Gy
Session Number: 4

## 2023-09-11 NOTE — Progress Notes (Signed)
 Pharmacist Chemotherapy Monitoring - Initial Assessment    Anticipated start date: 09/19/23   The following has been reviewed per standard work regarding the patient's treatment regimen: The patient's diagnosis, treatment plan and drug doses, and organ/hematologic function Lab orders and baseline tests specific to treatment regimen  The treatment plan start date, drug sequencing, and pre-medications Prior authorization status  Patient's documented medication list, including drug-drug interaction screen and prescriptions for anti-emetics and supportive care specific to the treatment regimen The drug concentrations, fluid compatibility, administration routes, and timing of the medications to be used The patient's access for treatment and lifetime cumulative dose history, if applicable  The patient's medication allergies and previous infusion related reactions, if applicable   Changes made to treatment plan:  N/A  Follow up needed:  N/A   Anes Rigel, Pharm.D., CPP 09/11/2023@3 :51 PM

## 2023-09-11 NOTE — Telephone Encounter (Signed)
 09/08/2023 Late entry  Medication Prior Authorization Status  Processed CoverMyMeds KEY: [id number] for Lidocaine -Prilocaine  2.5-2.5% cream  Approved Today  Per [employer]  PA Case ID: [id number]   Effective 09/08/2023 through 09/07/2024.SABRA

## 2023-09-11 NOTE — Progress Notes (Deleted)
 Note created in error.

## 2023-09-11 NOTE — Progress Notes (Signed)
 [facility] CSW Progress Note  Visual merchandiser met with pt and daughter during doctor's visit.    Interventions: CSW introduced self and services to pt following up on phone assessment conducted with pt's daughter.  Per daughter she was able to speak with Delon at [facility] and will be exploring additional ALF options.  Pt states he may want to engage in counseling.  CSW to meet with pt at first infusion to check in.       Follow Up Plan:  Patient will contact CSW with any support or resource needs    Devere JONELLE Manna, LCSW Clinical Social Worker [facility]

## 2023-09-11 NOTE — H&P (Signed)
 Chief Complaint: Recently diagnosed left lung cancer; referred for Port-A-Cath placement to assist with treatment  Referring Provider(s): Mohamed,M  Supervising Physician: Philip Cornet  Patient Status: [facility] - Out-pt  History of Present Illness: [name] is a 73 y.o. male ex-smoker with past medical history significant for heart failure, anxiety, arthritis, chronic kidney disease, depression, GERD, gout, migraine headaches, hypertension, hyperlipidemia, IBS, MGUS, obesity, prediabetes, prior stroke, anemia and recently diagnosed left lung adenocarcinoma.  He is scheduled today for Port-A-Cath placement to assist with treatment.  *** Patient is Full Code  Past Medical History:  Diagnosis Date   (HFpEF) heart failure with preserved ejection fraction (HCC)    Allergy    Anxiety    Arthritis    Chronic kidney disease    Depression    Frequent urination    GERD (gastroesophageal reflux disease)    Gout    Headache    mirgaines in the past   Herpes    History of nuclear stress test    Myoview  05/2021: EF 77, normal perfusion, low risk   Hyperlipidemia    Hypertension    IBS (irritable bowel syndrome)    Memory loss    MGUS (monoclonal gammopathy of unknown significance)    Obesity    Pre-diabetes    Stroke [facility])    Urgency of urination     Past Surgical History:  Procedure Laterality Date   CHOLECYSTECTOMY N/A 12/17/2019   Procedure: LAPAROSCOPIC CHOLECYSTECTOMY WITH ICG INJECTION;  Surgeon: Ebbie Cough, MD;  Location: [facility] OR;  Service: General;  Laterality: N/A;   disckec     shouler arthroscopy Left    SPINAL FUSION     TONSILLECTOMY     TOTAL KNEE ARTHROPLASTY Right 11/15/2021   Procedure: TOTAL KNEE ARTHROPLASTY;  Surgeon: Melodi Lerner, MD;  Location: [facility] ORS;  Service: Orthopedics;  Laterality: Right;   VIDEO BRONCHOSCOPY WITH ENDOBRONCHIAL NAVIGATION Left 08/21/2023   Procedure: VIDEO BRONCHOSCOPY WITH ENDOBRONCHIAL NAVIGATION;  Surgeon: Shelah Lamar RAMAN, MD;  Location: [facility] ENDOSCOPY;  Service: Pulmonary;  Laterality: Left;   VIDEO BRONCHOSCOPY WITH ENDOBRONCHIAL ULTRASOUND  08/21/2023   Procedure: BRONCHOSCOPY, WITH EBUS;  Surgeon: Shelah Lamar RAMAN, MD;  Location: [facility] ENDOSCOPY;  Service: Pulmonary;;   VIDEO BRONCHOSCOPY WITH RADIAL ENDOBRONCHIAL ULTRASOUND  08/21/2023   Procedure: VIDEO BRONCHOSCOPY WITH RADIAL ENDOBRONCHIAL ULTRASOUND;  Surgeon: Shelah Lamar RAMAN, MD;  Location: [facility] ENDOSCOPY;  Service: Pulmonary;;   WISDOM TOOTH EXTRACTION      Allergies: Divalproex sodium, Lamotrigine, Valproic acid, Amitriptyline, Aspirin , Hydrocodone , Lisinopril, Lithium, Morphine , Nsaids, Oxycodone, Oxycodone-acetaminophen , and Pork allergy  Medications: Prior to Admission medications   Medication Sig Start Date End Date Taking? Authorizing Provider  acetaminophen  (TYLENOL ) 500 MG tablet Take 1-2 tablets (500-1,000 mg total) by mouth every 6 (six) hours as needed for moderate pain. 11/18/21   Edmisten, Kristie L, PA  allopurinol  (ZYLOPRIM ) 100 MG tablet Take 150 mg by mouth daily. 09/14/22   [provider]  amLODipine  (NORVASC ) 10 MG tablet Take 1 tablet (10 mg total) by mouth daily. 07/26/23   Delford Maude BROCKS, MD  benzonatate  (TESSALON ) 200 MG capsule Take 1 capsule (200 mg total) by mouth in the morning and at bedtime. 09/01/23   Jadine Toribio SQUIBB, MD  busPIRone  (BUSPAR ) 30 MG tablet Take 30 mg by mouth 2 (two) times daily.    [provider]  cetirizine  (ZYRTEC  ALLERGY) 10 MG tablet Take 1 tablet (10 mg total) by mouth daily. Patient taking differently: Take 10 mg  by mouth at bedtime. 04/25/22   Christopher Savannah, PA-C  clindamycin (CLEOCIN) 300 MG capsule Take 300 mg by mouth 3 (three) times daily. 08/15/23   [provider]  clopidogrel  (PLAVIX ) 75 MG tablet Take 1 tablet (75 mg total) by mouth daily. Okay to restart this medication on 08/22/2023. 08/21/23   Shelah Lamar RAMAN, MD  dextromethorphan  (DELSYM ) 30 MG/5ML liquid Take 5 mLs (30 mg  total) by mouth 2 (two) times daily as needed for cough. 08/27/23   Randol Simmonds, MD  fluticasone  (FLONASE ) 50 MCG/ACT nasal spray Place 1 spray into both nostrils daily. 09/02/23   Jadine Toribio SQUIBB, MD  folic acid  (FOLVITE ) 1 MG tablet Take 1 mg by mouth daily. 11/10/21   [provider]  furosemide  (LASIX ) 40 MG tablet Take 1 tablet (40 mg total) by mouth daily. 07/13/23   Santo Stanly LABOR, MD  hydrALAZINE  (APRESOLINE ) 50 MG tablet Take 1.5 tablets (75 mg total) by mouth 2 (two) times daily. 08/16/23   Chandrasekhar, Stanly LABOR, MD  HYDROcodone  bit-homatropine (HYCODAN) 5-1.5 MG/5ML syrup Take 5 mLs by mouth every 4 (four) hours as needed (Persistent cough). 09/01/23   Jadine Toribio SQUIBB, MD  Hydroxychloroquine  Sulfate 400 MG TABS Take 400 mg by mouth at bedtime.    [provider]  Lactobacillus (ACIDOPHILUS) CAPS capsule Take 1 capsule by mouth daily. Patient taking differently: Take 1 capsule by mouth in the morning. 07/03/23   Vann, Jessica U, DO  lidocaine  (LIDODERM ) 5 % Place 1 patch onto the skin daily as needed (for pain- Remove & Discard patch within 12 hours or as directed by MD).    [provider]  lidocaine -prilocaine  (EMLA ) cream Apply to affected area once 09/08/23   Sherrod Sherrod, MD  ondansetron  (ZOFRAN ) 8 MG tablet Take 1 tablet (8 mg total) by mouth every 8 (eight) hours as needed for nausea or vomiting. Start on the third day after chemotherapy. 09/08/23   Sherrod Sherrod, MD  PARoxetine  (PAXIL -CR) 37.5 MG 24 hr tablet Take 1 tablet (37.5 mg total) by mouth at bedtime. 08/21/23   Shelah Lamar RAMAN, MD  primidone  (MYSOLINE ) 250 MG tablet Take 1 tablet (250 mg total) by mouth 2 (two) times daily. 09/04/23   Rosemarie Eather RAMAN, MD  prochlorperazine  (COMPAZINE ) 10 MG tablet Take 1 tablet (10 mg total) by mouth every 6 (six) hours as needed for nausea or vomiting. 09/08/23   Sherrod Sherrod, MD  QUEtiapine  (SEROQUEL ) 25 MG tablet Take 25 mg by mouth at bedtime.     [provider]  rosuvastatin  (CRESTOR ) 40 MG tablet Take 40 mg by mouth daily.    [provider]  traZODone  (DESYREL ) 100 MG tablet Take 100 mg by mouth at bedtime.    [provider]  valACYclovir  (VALTREX ) 1000 MG tablet Take 1,000 mg by mouth daily.    [provider]  Vitamin D , Ergocalciferol , (DRISDOL ) 1.25 MG (50000 UNIT) CAPS capsule Take 50,000 Units by mouth every Saturday. 09/17/20   [provider]     Family History  Problem Relation Age of Onset   Hypertension Mother    Hypertension Father    Hypertension Sister    Hypertension Brother    Cancer Maternal Aunt    Cancer - Ovarian Maternal Grandmother    Diabetes Mellitus I Paternal Grandmother    Hypertension Paternal Grandfather    Cancer Maternal Aunt     Social History   Socioeconomic History   Marital status: Divorced    Spouse  name: Not on file   Number of children: 1   Years of education: Masters   Highest education level: Not on file  Occupational History   Occupation: retired    Associate Professor: RETIRED  Tobacco Use   Smoking status: Former    Current packs/day: 0.00    Types: Cigarettes    Quit date: 1990    Years since quitting: 35.6   Smokeless tobacco: Never  Vaping Use   Vaping status: Never Used  Substance and Sexual Activity   Alcohol  use: No    Alcohol /week: 1.0 standard drink of alcohol     Types: 1 Cans of beer per week   Drug use: No   Sexual activity: Never  Other Topics Concern   Not on file  Social History Narrative   Patient lives in Independent living @ [facility]   Patient is right-handed.   Patient does not drink any caffeine.   Social Drivers of Corporate investment banker Strain: Not on file  Food Insecurity: No Food Insecurity (09/07/2023)   Hunger Vital Sign    Worried About Running Out of Food in the Last Year: Never true    Ran Out of Food in the Last Year: Never true  Transportation Needs: No Transportation Needs  (09/07/2023)   PRAPARE - Administrator, Civil Service (Medical): No    Lack of Transportation (Non-Medical): No  Physical Activity: Not on file  Stress: Not on file  Social Connections: Moderately Isolated (08/28/2023)   Social Connection and Isolation Panel    Frequency of Communication with Friends and Family: More than three times a week    Frequency of Social Gatherings with Friends and Family: Twice a week    Attends Religious Services: Never    Database administrator or Organizations: Yes    Attends Engineer, structural: More than 4 times per year    Marital Status: Divorced       Review of Systems  Vital Signs:   Advance Care Plan: No documents on file  Physical Exam  Imaging: DG Chest Port 1 View Result Date: 08/29/2023 CLINICAL DATA:  Shortness of breath. EXAM: PORTABLE CHEST 1 VIEW COMPARISON:  Chest CTA 08/27/2023 chest radiograph 08/27/2023 FINDINGS: Again noted is the mass in the left upper lobe/left suprahilar region. Hazy densities in the left lung most likely associated with some volume loss. Right lung remains clear. Heart size is stable and within normal limits. Trachea is midline. Negative for a pneumothorax. No acute bone abnormality. IMPRESSION: 1. Persistent mass in the left upper lobe. 2. Hazy densities in the left lung are most compatible with volume loss. Electronically Signed   By: Juliene Balder M.D.   On: 08/29/2023 08:28   MR BRAIN W WO CONTRAST Result Date: 08/28/2023 CLINICAL DATA:  Metastatic disease evaluation EXAM: MRI HEAD WITHOUT AND WITH CONTRAST TECHNIQUE: Multiplanar, multiecho pulse sequences of the brain and surrounding structures were obtained without and with intravenous contrast. CONTRAST:  8mL GADAVIST  GADOBUTROL  1 MMOL/ML IV SOLN COMPARISON:  None Available. FINDINGS: MRI brain: There are multiple foci of encephalomalacia in the periventricular white matter of both hemispheres, left more than right with corpus callosal  atrophy. There is no abnormal enhancement. No evidence of metastatic disease. There is no acute or chronic infarct. The ventricles are normal. No mass lesion. There are normal flow signals in the carotid arteries and basilar artery. No significant bone marrow signal abnormality. No significant abnormality in the paranasal sinuses or soft  tissues. IMPRESSION: 1. There is no evidence of metastatic disease 2. There are multiple periventricular foci of encephalomalacia, left hemisphere more than the right, with atrophy of the corpus callosum. The pattern of disease is suggestive of chronic demyelination Electronically Signed   By: Nancyann Burns M.D.   On: 08/28/2023 11:54   CT Angio Chest PE W and/or Wo Contrast Result Date: 08/27/2023 CLINICAL DATA:  Hemoptysis.  Lung mass.  * Tracking Code: BO * EXAM: CT ANGIOGRAPHY CHEST WITH CONTRAST TECHNIQUE: Multidetector CT imaging of the chest was performed using the standard protocol during bolus administration of intravenous contrast. Multiplanar CT image reconstructions and MIPs were obtained to evaluate the vascular anatomy. RADIATION DOSE REDUCTION: This exam was performed according to the departmental dose-optimization program which includes automated exposure control, adjustment of the mA and/or kV according to patient size and/or use of iterative reconstruction technique. CONTRAST:  75mL OMNIPAQUE  IOHEXOL  350 MG/ML SOLN COMPARISON:  PET 07/26/2023 and CT chest 06/30/2023. FINDINGS: Cardiovascular: Negative for pulmonary embolus. Atherosclerotic calcification of the aorta. Heart is at the upper limits of normal in size to mildly enlarged. No pericardial effusion. Enlarged pulmonic trunk. Mediastinum/Nodes: Left hilar adenopathy measures up to 1.5 cm. No pathologically enlarged mediastinal or axillary lymph nodes. Air in the esophagus can be seen with dysmotility. Lungs/Pleura: Left upper lobe mass measures approximately 5.0 x 5.6 cm, stable to minimally enlarged from  4.5 x 5.2 cm on 06/30/2023. Long border of contact with the adjacent mediastinum with extension to the left hilum. 10 mm ground-glass nodule in the posterolateral right lower lobe (12/77), unchanged. Newly obstructed superior segmental left lower lobe bronchus with mild postobstructive ground-glass nodularity in the left lower lobe. No pleural fluid. Airway is otherwise unremarkable. Upper Abdomen: Hepatic cysts. Cholecystectomy. Low-attenuation lesions in the kidneys. No specific follow-up necessary. Elevated left hemidiaphragm. Visualized portions of the liver, adrenal glands, kidneys, spleen, pancreas, stomach and bowel are otherwise grossly unremarkable. No upper abdominal adenopathy. Musculoskeletal: Degenerative changes in the spine. Review of the MIP images confirms the above findings. IMPRESSION: 1. Negative for pulmonary embolus. 2. Similar to minimally enlarged left upper lobe mass with left hilar adenopathy, compatible with primary bronchogenic carcinoma. 3. New obstruction of the superior segmental left lower lobe bronchus with postobstructive ground-glass nodularity in the left lower lobe. 4. Stable 10 mm ground-glass right lower lobe nodule. Recommend attention on follow-up. 5.  Aortic atherosclerosis (ICD10-I70.0). 6. Electronically Signed   By: Newell Eke M.D.   On: 08/27/2023 17:27   DG Chest Port 1 View Result Date: 08/27/2023 CLINICAL DATA:  Hemoptysis.  Recent bronchoscopy with biopsy. EXAM: PORTABLE CHEST 1 VIEW COMPARISON:  Radiograph 08/21/2023.  PET CT 07/26/2023 FINDINGS: Unchanged mild volume loss in the left hemithorax with left suprahilar opacity corresponding to hypermetabolic mass on PET-CT. No pneumothorax. Stable heart size and mediastinal contours. No new airspace disease. No pulmonary edema. No pleural effusion. IMPRESSION: Unchanged mild volume loss in the left hemithorax with left suprahilar opacity corresponding to hypermetabolic mass on PET-CT. No pneumothorax or new  findings. Electronically Signed   By: Andrea Gasman M.D.   On: 08/27/2023 14:05   DG Chest Port 1 View Result Date: 08/21/2023 CLINICAL DATA:  Post bronchoscopy with biopsy. EXAM: PORTABLE CHEST 1 VIEW COMPARISON:  06/30/2023 FINDINGS: Again noted is the left upper lobe suprahilar mass, unchanged since prior study. No pneumothorax following bronchoscopy. Right lung clear. No effusions. Heart is normal size. No acute bony abnormality. IMPRESSION: No pneumothorax following bronchoscopy. Electronically Signed  By: Franky Crease M.D.   On: 08/21/2023 12:40   DG C-ARM BRONCHOSCOPY Result Date: 08/21/2023 C-ARM BRONCHOSCOPY: Fluoroscopy was utilized by the requesting physician.  No radiographic interpretation.    Labs:  CBC: Recent Labs    08/30/23 0714 08/30/23 2055 08/31/23 0829 09/01/23 0325 09/02/23 0250 09/07/23 1410  WBC 6.5  --   --  6.0 7.1 6.5  HGB 7.8*   < > 7.5* 7.7* 7.5* 9.0*  HCT 24.2*   < > 22.2* 23.6* 22.8* 26.5*  PLT 236  --   --  261 293 473*   < > = values in this interval not displayed.    COAGS: Recent Labs    08/27/23 1316 08/27/23 2357  INR 1.0 1.1    BMP: Recent Labs    08/29/23 0332 08/30/23 0714 08/31/23 0829 09/07/23 1410  NA 139 138 141 140  K 3.5 3.4* 3.7 3.5  CL 110 110 112* 105  CO2 20* 19* 22 28  GLUCOSE 107* 110* 111* 95  BUN 45* 26* 18 15  CALCIUM  8.3* 8.1* 8.3* 8.9  CREATININE 1.48* 1.43* 1.42* 1.49*  GFRNONAA 50* 52* 53* 50*    LIVER FUNCTION TESTS: Recent Labs    06/30/23 2102 07/01/23 0540 08/27/23 2357 09/07/23 1410  BILITOT 0.7 0.5 0.5 0.3  AST 24 22 22 19   ALT 23 24 17 16   ALKPHOS 48 49 51 59  PROT 6.6 6.2* 7.2 7.2  ALBUMIN 3.6 3.5 4.0 4.3    TUMOR MARKERS: No results for input(s): AFPTM, CEA, CA199, CHROMGRNA in the last 8760 hours.  Assessment and Plan: 73 y.o. male ex-smoker with past medical history significant for heart failure, anxiety, arthritis, chronic kidney disease, depression, GERD, gout,  migraine headaches, hypertension, hyperlipidemia, IBS, MGUS, obesity, prediabetes, prior stroke, anemia and recently diagnosed left lung adenocarcinoma.  He is scheduled today for Port-A-Cath placement to assist with treatment.Risks and benefits of image guided port-a-catheter placement was discussed with the patient including, but not limited to bleeding, infection, pneumothorax, or fibrin sheath development and need for additional procedures.  All of the patient's questions were answered, patient is agreeable to proceed. Consent signed and in chart.    Thank you for allowing our service to participate in [name] 's care.  Electronically Signed: D. Franky Rakers, PA-C   09/11/2023, 5:08 PM      I spent a total of   20 minutes  in face to face in clinical consultation, greater than 50% of which was counseling/coordinating care for Port-A-Cath placement

## 2023-09-12 ENCOUNTER — Other Ambulatory Visit: Payer: Self-pay | Admitting: Physician Assistant

## 2023-09-12 ENCOUNTER — Telehealth: Payer: Self-pay | Admitting: Neurology

## 2023-09-12 ENCOUNTER — Inpatient Hospital Stay
Admission: RE | Admit: 2023-09-12 | Discharge: 2023-09-12 | Disposition: A | Discharge status: Home / Self Care | Payer: Self-pay | Source: Ambulatory Visit | Attending: Radiation Oncology

## 2023-09-12 ENCOUNTER — Ambulatory Visit (HOSPITAL_COMMUNITY)
Admission: RE | Admit: 2023-09-12 | Discharge: 2023-09-12 | Disposition: A | Discharge status: Home / Self Care | Source: Ambulatory Visit | Attending: Internal Medicine | Admitting: Internal Medicine

## 2023-09-12 ENCOUNTER — Other Ambulatory Visit: Payer: Self-pay

## 2023-09-12 ENCOUNTER — Encounter (HOSPITAL_COMMUNITY): Payer: Self-pay

## 2023-09-12 ENCOUNTER — Inpatient Hospital Stay

## 2023-09-12 DIAGNOSIS — Z01818 Encounter for other preprocedural examination: Secondary | ICD-10-CM | POA: Insufficient documentation

## 2023-09-12 DIAGNOSIS — C3412 Malignant neoplasm of upper lobe, left bronchus or lung: Secondary | ICD-10-CM

## 2023-09-12 DIAGNOSIS — Z452 Encounter for adjustment and management of vascular access device: Secondary | ICD-10-CM | POA: Insufficient documentation

## 2023-09-12 DIAGNOSIS — Z510 Encounter for antineoplastic radiation therapy: Secondary | ICD-10-CM | POA: Diagnosis not present

## 2023-09-12 DIAGNOSIS — Z87891 Personal history of nicotine dependence: Secondary | ICD-10-CM | POA: Insufficient documentation

## 2023-09-12 HISTORY — PX: IR IMAGING GUIDED PORT INSERTION: IMG5740

## 2023-09-12 LAB — RAD ONC ARIA SESSION SUMMARY
Course Elapsed Days: 6
Plan Fractions Treated to Date: 5
Plan Prescribed Dose Per Fraction: 2 Gy
Plan Total Fractions Prescribed: 33
Plan Total Prescribed Dose: 66 Gy
Reference Point Dosage Given to Date: 10 Gy
Reference Point Session Dosage Given: 2 Gy
Session Number: 5

## 2023-09-12 MED ORDER — MIDAZOLAM HCL 2 MG/2ML IJ SOLN
INTRAMUSCULAR | Status: AC | PRN
  Administered 2023-09-12 (×4): 1 mg via INTRAVENOUS

## 2023-09-12 MED ORDER — FENTANYL CITRATE (PF) 100 MCG/2ML IJ SOLN
INTRAMUSCULAR | Status: AC
  Filled 2023-09-12: qty 2

## 2023-09-12 MED ORDER — LIDOCAINE-EPINEPHRINE 1 %-1:100000 IJ SOLN: 20.0000 mL | Freq: Once | INTRAMUSCULAR | Status: DC

## 2023-09-12 MED ORDER — HEPARIN SOD (PORK) LOCK FLUSH 100 UNIT/ML IV SOLN
INTRAVENOUS | Status: AC
  Filled 2023-09-12: qty 5

## 2023-09-12 MED ORDER — DIPHENHYDRAMINE HCL 50 MG/ML IJ SOLN
INTRAMUSCULAR | Status: AC | PRN
  Administered 2023-09-12: 25 mg via INTRAVENOUS

## 2023-09-12 MED ORDER — FENTANYL CITRATE (PF) 100 MCG/2ML IJ SOLN
INTRAMUSCULAR | Status: AC | PRN
  Administered 2023-09-12 (×2): 50 ug via INTRAVENOUS

## 2023-09-12 MED ORDER — SODIUM CHLORIDE 0.9 % IV SOLN: INTRAVENOUS | Status: DC

## 2023-09-12 MED ORDER — MIDAZOLAM HCL 2 MG/2ML IJ SOLN
INTRAMUSCULAR | Status: AC
  Filled 2023-09-12: qty 4

## 2023-09-12 MED ORDER — LIDOCAINE HCL 1 % IJ SOLN
INTRAMUSCULAR | Status: AC
  Filled 2023-09-12: qty 20

## 2023-09-12 MED ORDER — DIPHENHYDRAMINE HCL 50 MG/ML IJ SOLN
INTRAMUSCULAR | Status: AC
  Filled 2023-09-12: qty 1

## 2023-09-12 MED ORDER — LIDOCAINE HCL 1 % IJ SOLN: 20.0000 mL | Freq: Once | INTRAMUSCULAR | Status: DC

## 2023-09-12 MED ORDER — LIDOCAINE-EPINEPHRINE 1 %-1:100000 IJ SOLN
INTRAMUSCULAR | Status: AC
  Filled 2023-09-12: qty 1

## 2023-09-12 NOTE — Telephone Encounter (Signed)
 Called the daughter back. Pt has recently been diagnosed with non small cell adenocarcinoma. He is receiving chemo and radiation. States that he had a episode where he was coughing up blood. His plavix  has been held indefinitely.  Pt was suppose to come in for a apt in sept but due to to his schedule being so full this may put a lot on the body. She also is having a harder time setting him up with additional transportation. Advised he follow up here was a 6 mth follow up. His oncology treatment would take precedent over our follow up. Informed I would pass this information along to Dr Rosemarie to make him aware of everything. Pt was rescheduled to 11/11 to allow him time to complete his treatments.

## 2023-09-12 NOTE — Telephone Encounter (Signed)
 Called and spoke to patient. Made patient aware per Dr. Santo no barriers to starting chemotherapy and patient has no active cardiac disease. Made patient aware that his appt can be rescheduled as needed. Made patient aware that Dr. Santo wished him a speedy recovery. Made patient aware to call office for any questions or concerns. Understanding verbalized.

## 2023-09-12 NOTE — Telephone Encounter (Signed)
 Pt has been diagnosed with Lung cancer and the daughter is needing to speak to a nurse to discuss how to proceed with still being treated by provider. Please advise.

## 2023-09-12 NOTE — Procedures (Signed)
 Interventional Radiology Procedure:   Indications: Lung cancer  Procedure: Port placement  Findings: Right jugular port, tip at SVC/RA junction  Complications: None     EBL: Minimal, less than 10 ml  Plan: Discharge in one hour.  Keep port site and incisions dry for at least 24 hours.     Shavy Beachem R. Lowella Dandy, MD  Pager: [phone]

## 2023-09-12 NOTE — Discharge Instructions (Signed)
 PLEASE CALL INTERVENTIONAL RADIOLOGY CLINIC [phone] WITH ANY QUESTIONS OR CONCERNS  YOU MAY REMOVE YOUR DRESSING AND SHOWER TOMORROW

## 2023-09-12 NOTE — Telephone Encounter (Signed)
Patient is calling in for update. Please advise

## 2023-09-13 ENCOUNTER — Telehealth: Payer: Self-pay | Admitting: Physician Assistant

## 2023-09-13 ENCOUNTER — Encounter

## 2023-09-13 ENCOUNTER — Telehealth: Payer: Self-pay | Admitting: Pharmacy Technician

## 2023-09-13 ENCOUNTER — Ambulatory Visit

## 2023-09-13 ENCOUNTER — Other Ambulatory Visit (HOSPITAL_COMMUNITY): Payer: Self-pay | Admitting: Family Medicine

## 2023-09-13 ENCOUNTER — Other Ambulatory Visit: Payer: Self-pay

## 2023-09-13 ENCOUNTER — Encounter: Payer: Self-pay | Admitting: Internal Medicine

## 2023-09-13 ENCOUNTER — Other Ambulatory Visit: Payer: Self-pay | Admitting: Physician Assistant

## 2023-09-13 ENCOUNTER — Ambulatory Visit
Admission: RE | Admit: 2023-09-13 | Discharge: 2023-09-13 | Disposition: A | Discharge status: Home / Self Care | Payer: Self-pay | Source: Ambulatory Visit | Attending: Radiation Oncology | Admitting: Radiation Oncology

## 2023-09-13 DIAGNOSIS — Z510 Encounter for antineoplastic radiation therapy: Secondary | ICD-10-CM | POA: Diagnosis not present

## 2023-09-13 DIAGNOSIS — D509 Iron deficiency anemia, unspecified: Secondary | ICD-10-CM | POA: Insufficient documentation

## 2023-09-13 LAB — RAD ONC ARIA SESSION SUMMARY
Course Elapsed Days: 7
Plan Fractions Treated to Date: 6
Plan Prescribed Dose Per Fraction: 2 Gy
Plan Total Fractions Prescribed: 33
Plan Total Prescribed Dose: 66 Gy
Reference Point Dosage Given to Date: 12 Gy
Reference Point Session Dosage Given: 2 Gy
Session Number: 6

## 2023-09-13 NOTE — Telephone Encounter (Signed)
 Daughter called please see encounter that was started on 09/08/23.

## 2023-09-13 NOTE — Telephone Encounter (Signed)
 Pt's daughter [name] is requesting a callback regarding her wanting to talk through how Dr. Santo would like to proceed with her father's cardiology visits given his recent cancer diagnosis and treatment plan. She sent a MyChart message at 1:38 and at 1:41 a nurse responded so I then let her know but she stated she'd rather be called as she requested when she sent the message. Please advise

## 2023-09-13 NOTE — Progress Notes (Unsigned)
 09/07/2023  NN met the pt and pts daughter, [name], at his medical oncology consult. The plan for the pt is concurrent chemoradiation with or without consolidation immunotherapy.  Pts tissue has been sent for molecular studies. Pt already established with radiation oncology. Pt and his daughter provided with a lung cancer journey book and NNs direct contact information.  Recommendation for port made. NN will arrange.   Pt currently resides at [facility] and is already established with transportation with that facility.

## 2023-09-13 NOTE — Progress Notes (Signed)
 Sales executive Form faxed to [employer] at [phone]

## 2023-09-13 NOTE — Telephone Encounter (Signed)
 Called pt daughter.  Dtr reports pt is undergoing Radiation and Chemotherapy and needs to cancel upcoming appointments.  Reports hydralazine  and plavix  are on hold.  Would like to know at what BP range should she restart hydralazine .   Also would like to know if pt needs to reschedule 10/23/23 OV with Glendia Ferrier, PA or if can just cancel.   Advised daughter Oncology should be checking BP at office visits; if have issues will contact Dr. Santo for support if needed.  Daughter will send in BP readings for MD to review since pt is no longer taking hydralazine .  Advised will send to MD to review.

## 2023-09-13 NOTE — Telephone Encounter (Signed)
 Hello,  We have received x2 iron orders for patient. Venofer and Feraheme.  Venofer will not be covered under his insurance.  Feraheme is the preferred medication.  Patient will be scheduled as soon as possible.  Auth Submission: NO AUTH NEEDED Site of care: Site of care: [facility] Payer: Medicare A/B & BCBS Medication & CPT/J Code(s) submitted: Feraheme (ferumoxytol) [id number] Diagnosis Code: D50.9 Route of submission (phone, fax, portal):  Phone # Fax # Auth type: Buy/Bill HB Units/visits requested: X2 DOSES Reference number:  Approval from: 09/13/23 to 12/14/23   Thanks Luke

## 2023-09-14 ENCOUNTER — Encounter: Payer: Self-pay | Admitting: Internal Medicine

## 2023-09-14 ENCOUNTER — Other Ambulatory Visit: Payer: Self-pay

## 2023-09-14 ENCOUNTER — Encounter: Payer: Self-pay | Admitting: Physician Assistant

## 2023-09-14 ENCOUNTER — Encounter

## 2023-09-14 ENCOUNTER — Inpatient Hospital Stay
Admission: RE | Admit: 2023-09-14 | Discharge: 2023-09-14 | Disposition: A | Discharge status: Home / Self Care | Payer: Self-pay | Source: Ambulatory Visit | Attending: Radiation Oncology | Admitting: Radiation Oncology

## 2023-09-14 DIAGNOSIS — Z510 Encounter for antineoplastic radiation therapy: Secondary | ICD-10-CM | POA: Diagnosis not present

## 2023-09-14 LAB — RAD ONC ARIA SESSION SUMMARY
Course Elapsed Days: 8
Plan Fractions Treated to Date: 7
Plan Prescribed Dose Per Fraction: 2 Gy
Plan Total Fractions Prescribed: 33
Plan Total Prescribed Dose: 66 Gy
Reference Point Dosage Given to Date: 14 Gy
Reference Point Session Dosage Given: 2 Gy
Session Number: 7

## 2023-09-14 NOTE — Telephone Encounter (Signed)
 Please see my chart encounter started on 09/13/23 for more details regarding concern.  My chart message sent today 09/14/23.

## 2023-09-15 ENCOUNTER — Other Ambulatory Visit: Payer: Self-pay

## 2023-09-15 ENCOUNTER — Other Ambulatory Visit

## 2023-09-15 ENCOUNTER — Inpatient Hospital Stay
Admission: RE | Admit: 2023-09-15 | Discharge: 2023-09-15 | Disposition: A | Discharge status: Home / Self Care | Payer: Self-pay | Source: Ambulatory Visit | Attending: Radiation Oncology | Admitting: Radiation Oncology

## 2023-09-15 ENCOUNTER — Inpatient Hospital Stay

## 2023-09-15 DIAGNOSIS — Z510 Encounter for antineoplastic radiation therapy: Secondary | ICD-10-CM | POA: Diagnosis not present

## 2023-09-15 LAB — RAD ONC ARIA SESSION SUMMARY
Course Elapsed Days: 9
Plan Fractions Treated to Date: 8
Plan Prescribed Dose Per Fraction: 2 Gy
Plan Total Fractions Prescribed: 33
Plan Total Prescribed Dose: 66 Gy
Reference Point Dosage Given to Date: 16 Gy
Reference Point Session Dosage Given: 2 Gy
Session Number: 8

## 2023-09-15 NOTE — Progress Notes (Signed)
 Received VM from pts daughter, [name], that she received a noticed from [place] that pt requires and ABN for his tissue testing. NN reached out to [employer] to verify need for ABN. Progress note from pts visit with Dr Sherrod on 8/22 faxed to [facility] to verify pts current cancer stage as requested. Melissa tells this NN that she is going to send the pts progress note over to billing and pt's daughter does not need to do anything with the ABN at this time. Whitney called by NN at this time to relay guidance provided by [employer]. Whitney voiced no questions at this time.

## 2023-09-19 ENCOUNTER — Ambulatory Visit
Admission: RE | Admit: 2023-09-19 | Discharge: 2023-09-19 | Disposition: A | Discharge status: Home / Self Care | Source: Ambulatory Visit | Attending: Radiation Oncology | Admitting: Radiation Oncology

## 2023-09-19 ENCOUNTER — Other Ambulatory Visit: Payer: Self-pay

## 2023-09-19 ENCOUNTER — Other Ambulatory Visit: Payer: Self-pay | Admitting: Internal Medicine

## 2023-09-19 ENCOUNTER — Ambulatory Visit
Admission: RE | Admit: 2023-09-19 | Discharge: 2023-09-19 | Disposition: A | Discharge status: Home / Self Care | Payer: Self-pay | Source: Ambulatory Visit | Attending: Radiation Oncology | Admitting: Radiation Oncology

## 2023-09-19 ENCOUNTER — Ambulatory Visit: Admitting: Pharmacist Clinician (PhC)/ Clinical Pharmacy Specialist

## 2023-09-19 ENCOUNTER — Inpatient Hospital Stay

## 2023-09-19 VITALS — BP 135/85 | HR 70 | Temp 98.2°F | Resp 16 | Wt 197.8 lb

## 2023-09-19 DIAGNOSIS — D649 Anemia, unspecified: Secondary | ICD-10-CM | POA: Insufficient documentation

## 2023-09-19 DIAGNOSIS — E785 Hyperlipidemia, unspecified: Secondary | ICD-10-CM | POA: Insufficient documentation

## 2023-09-19 DIAGNOSIS — I700 Atherosclerosis of aorta: Secondary | ICD-10-CM | POA: Insufficient documentation

## 2023-09-19 DIAGNOSIS — G9389 Other specified disorders of brain: Secondary | ICD-10-CM | POA: Insufficient documentation

## 2023-09-19 DIAGNOSIS — L603 Nail dystrophy: Secondary | ICD-10-CM | POA: Insufficient documentation

## 2023-09-19 DIAGNOSIS — I130 Hypertensive heart and chronic kidney disease with heart failure and stage 1 through stage 4 chronic kidney disease, or unspecified chronic kidney disease: Secondary | ICD-10-CM | POA: Insufficient documentation

## 2023-09-19 DIAGNOSIS — Z923 Personal history of irradiation: Secondary | ICD-10-CM | POA: Insufficient documentation

## 2023-09-19 DIAGNOSIS — C3412 Malignant neoplasm of upper lobe, left bronchus or lung: Secondary | ICD-10-CM

## 2023-09-19 DIAGNOSIS — F419 Anxiety disorder, unspecified: Secondary | ICD-10-CM | POA: Diagnosis present

## 2023-09-19 DIAGNOSIS — Z79624 Long term (current) use of inhibitors of nucleotide synthesis: Secondary | ICD-10-CM | POA: Insufficient documentation

## 2023-09-19 DIAGNOSIS — D472 Monoclonal gammopathy: Secondary | ICD-10-CM | POA: Insufficient documentation

## 2023-09-19 DIAGNOSIS — I5032 Chronic diastolic (congestive) heart failure: Secondary | ICD-10-CM | POA: Insufficient documentation

## 2023-09-19 DIAGNOSIS — Z7902 Long term (current) use of antithrombotics/antiplatelets: Secondary | ICD-10-CM | POA: Insufficient documentation

## 2023-09-19 DIAGNOSIS — K580 Irritable bowel syndrome with diarrhea: Secondary | ICD-10-CM | POA: Insufficient documentation

## 2023-09-19 DIAGNOSIS — G2581 Restless legs syndrome: Secondary | ICD-10-CM | POA: Insufficient documentation

## 2023-09-19 DIAGNOSIS — Z5111 Encounter for antineoplastic chemotherapy: Secondary | ICD-10-CM | POA: Insufficient documentation

## 2023-09-19 DIAGNOSIS — R197 Diarrhea, unspecified: Secondary | ICD-10-CM | POA: Insufficient documentation

## 2023-09-19 DIAGNOSIS — Z79899 Other long term (current) drug therapy: Secondary | ICD-10-CM | POA: Insufficient documentation

## 2023-09-19 DIAGNOSIS — K219 Gastro-esophageal reflux disease without esophagitis: Secondary | ICD-10-CM | POA: Insufficient documentation

## 2023-09-19 DIAGNOSIS — E876 Hypokalemia: Secondary | ICD-10-CM | POA: Insufficient documentation

## 2023-09-19 DIAGNOSIS — K7689 Other specified diseases of liver: Secondary | ICD-10-CM | POA: Insufficient documentation

## 2023-09-19 DIAGNOSIS — R590 Localized enlarged lymph nodes: Secondary | ICD-10-CM | POA: Insufficient documentation

## 2023-09-19 LAB — CMP (CANCER CENTER ONLY)
ALT: 10 U/L (ref 0–44)
AST: 15 U/L (ref 15–41)
Albumin: 3.9 g/dL (ref 3.5–5.0)
Alkaline Phosphatase: 54 U/L (ref 38–126)
Anion gap: 6 (ref 5–15)
BUN: 13 mg/dL (ref 8–23)
CO2: 28 mmol/L (ref 22–32)
Calcium: 8.6 mg/dL — ABNORMAL LOW (ref 8.9–10.3)
Chloride: 105 mmol/L (ref 98–111)
Creatinine: 1.25 mg/dL — ABNORMAL HIGH (ref 0.61–1.24)
GFR, Estimated: >60 mL/min (ref >60)
Glucose, Bld: 103 mg/dL — ABNORMAL HIGH (ref 70–99)
Potassium: 3.5 mmol/L (ref 3.5–5.1)
Sodium: 139 mmol/L (ref 135–145)
Total Bilirubin: 0.2 mg/dL (ref 0.0–1.2)
Total Protein: 6.8 g/dL (ref 6.5–8.1)

## 2023-09-19 LAB — RAD ONC ARIA SESSION SUMMARY
Course Elapsed Days: 13
Plan Fractions Treated to Date: 9
Plan Prescribed Dose Per Fraction: 2 Gy
Plan Total Fractions Prescribed: 33
Plan Total Prescribed Dose: 66 Gy
Reference Point Dosage Given to Date: 18 Gy
Reference Point Session Dosage Given: 2 Gy
Session Number: 9

## 2023-09-19 LAB — CBC WITH DIFFERENTIAL (CANCER CENTER ONLY)
Abs Immature Granulocytes: 0 K/uL (ref 0.00–0.07)
Basophils Absolute: 0 K/uL (ref 0.0–0.1)
Basophils Relative: 1 %
Eosinophils Absolute: 0.2 K/uL (ref 0.0–0.5)
Eosinophils Relative: 4 %
HCT: 23.8 % — ABNORMAL LOW (ref 39.0–52.0)
Hemoglobin: 8 g/dL — ABNORMAL LOW (ref 13.0–17.0)
Immature Granulocytes: 0 %
Lymphocytes Relative: 20 %
Lymphs Abs: 0.8 K/uL (ref 0.7–4.0)
MCH: 30.5 pg (ref 26.0–34.0)
MCHC: 33.6 g/dL (ref 30.0–36.0)
MCV: 90.8 fL (ref 80.0–100.0)
Monocytes Absolute: 0.5 K/uL (ref 0.1–1.0)
Monocytes Relative: 12 %
Neutro Abs: 2.6 K/uL (ref 1.7–7.7)
Neutrophils Relative %: 63 %
Platelet Count: 208 K/uL (ref 150–400)
RBC: 2.62 MIL/uL — ABNORMAL LOW (ref 4.22–5.81)
RDW: 13 % (ref 11.5–15.5)
WBC Count: 4.2 K/uL (ref 4.0–10.5)
nRBC: 0 % (ref 0.0–0.2)

## 2023-09-19 MED ORDER — SODIUM CHLORIDE 0.9 % IV SOLN
190.0000 mg | Freq: Once | INTRAVENOUS | Status: AC
  Administered 2023-09-19: 190 mg via INTRAVENOUS
  Filled 2023-09-19: qty 19

## 2023-09-19 MED ORDER — LORAZEPAM 2 MG/ML IJ SOLN
0.5000 mg | Freq: Once | INTRAMUSCULAR | Status: AC
  Administered 2023-09-19: 0.5 mg via INTRAVENOUS
  Filled 2023-09-19: qty 1

## 2023-09-19 MED ORDER — SODIUM CHLORIDE 0.9 % IV SOLN
45.0000 mg/m2 | Freq: Once | INTRAVENOUS | Status: AC
  Administered 2023-09-19: 90 mg via INTRAVENOUS
  Filled 2023-09-19: qty 15

## 2023-09-19 MED ORDER — PALONOSETRON HCL INJECTION 0.25 MG/5ML
0.2500 mg | Freq: Once | INTRAVENOUS | Status: AC
  Administered 2023-09-19: 0.25 mg via INTRAVENOUS
  Filled 2023-09-19: qty 5

## 2023-09-19 MED ORDER — DEXAMETHASONE SODIUM PHOSPHATE 10 MG/ML IJ SOLN
10.0000 mg | Freq: Once | INTRAMUSCULAR | Status: AC
  Administered 2023-09-19: 10 mg via INTRAVENOUS
  Filled 2023-09-19: qty 1

## 2023-09-19 MED ORDER — DIPHENHYDRAMINE HCL 50 MG/ML IJ SOLN
50.0000 mg | Freq: Once | INTRAMUSCULAR | Status: AC
  Administered 2023-09-19: 50 mg via INTRAVENOUS
  Filled 2023-09-19: qty 1

## 2023-09-19 MED ORDER — SODIUM CHLORIDE 0.9 % IV SOLN: INTRAVENOUS | Status: DC

## 2023-09-19 MED ORDER — FAMOTIDINE IN NACL 20-0.9 MG/50ML-% IV SOLN
20.0000 mg | Freq: Once | INTRAVENOUS | Status: AC
  Administered 2023-09-19: 20 mg via INTRAVENOUS
  Filled 2023-09-19: qty 50

## 2023-09-19 NOTE — Patient Instructions (Signed)

## 2023-09-19 NOTE — Progress Notes (Signed)
 After administration of 50mg  IV benadryl , patient became very restless and complained of anxiety. MD notified. Order given for ativan  0.5mg  IV. Benadryl  order switched to certirizine by MD.

## 2023-09-19 NOTE — Progress Notes (Signed)
 Increase C1D1 carbo dose to 190mg  d/t improved renal fxn per Dr. Sherrod.  Anthone Prieur, PharmD, MBA

## 2023-09-20 ENCOUNTER — Other Ambulatory Visit: Payer: Self-pay

## 2023-09-20 ENCOUNTER — Ambulatory Visit

## 2023-09-20 ENCOUNTER — Other Ambulatory Visit: Payer: Self-pay | Admitting: Internal Medicine

## 2023-09-20 ENCOUNTER — Ambulatory Visit
Admission: RE | Admit: 2023-09-20 | Discharge: 2023-09-20 | Disposition: A | Discharge status: Home / Self Care | Payer: Self-pay | Source: Ambulatory Visit | Attending: Radiation Oncology | Admitting: Radiation Oncology

## 2023-09-20 ENCOUNTER — Other Ambulatory Visit: Payer: Self-pay | Admitting: Physician Assistant

## 2023-09-20 ENCOUNTER — Inpatient Hospital Stay

## 2023-09-20 VITALS — BP 136/71 | HR 80 | Temp 98.2°F | Resp 20 | Ht 67.0 in | Wt 198.4 lb

## 2023-09-20 DIAGNOSIS — D509 Iron deficiency anemia, unspecified: Secondary | ICD-10-CM

## 2023-09-20 DIAGNOSIS — D649 Anemia, unspecified: Secondary | ICD-10-CM

## 2023-09-20 DIAGNOSIS — C3412 Malignant neoplasm of upper lobe, left bronchus or lung: Secondary | ICD-10-CM | POA: Diagnosis not present

## 2023-09-20 LAB — RAD ONC ARIA SESSION SUMMARY
Course Elapsed Days: 14
Plan Fractions Treated to Date: 10
Plan Prescribed Dose Per Fraction: 2 Gy
Plan Total Fractions Prescribed: 33
Plan Total Prescribed Dose: 66 Gy
Reference Point Dosage Given to Date: 20 Gy
Reference Point Session Dosage Given: 2 Gy
Session Number: 10

## 2023-09-20 MED ORDER — FERUMOXYTOL INJECTION 510 MG/17 ML
510.0000 mg | Freq: Once | INTRAVENOUS | Status: DC
  Filled 2023-09-20: qty 17

## 2023-09-20 NOTE — Progress Notes (Signed)
 Patient presented today for first Feraheme infusion. 2 RN's attempted to access port. Unable to get port to flush or draw. Notified ordering provider Cassandra Heilingoetter, PA-C via secure message at [phone]. This RN also spoke with both patient and patient's daughter/POA Whitney (via phone). Relayed information from ordering provider to patient, that he is able to ask for port to be checked as his next radiology appointment tomorrow, and that they will also check it at his upcoming lab appointment on 9/8. Patient verbalized understanding. Additional appointment made on 9/17.  Rocky FORBES Sar, RN

## 2023-09-21 ENCOUNTER — Telehealth: Payer: Self-pay | Admitting: Physician Assistant

## 2023-09-21 ENCOUNTER — Encounter

## 2023-09-21 ENCOUNTER — Inpatient Hospital Stay
Admission: RE | Admit: 2023-09-21 | Discharge: 2023-09-21 | Disposition: A | Discharge status: Home / Self Care | Payer: Self-pay | Source: Ambulatory Visit | Attending: Radiation Oncology | Admitting: Radiation Oncology

## 2023-09-21 ENCOUNTER — Other Ambulatory Visit: Payer: Self-pay

## 2023-09-21 DIAGNOSIS — C3412 Malignant neoplasm of upper lobe, left bronchus or lung: Secondary | ICD-10-CM | POA: Diagnosis not present

## 2023-09-21 LAB — RAD ONC ARIA SESSION SUMMARY
Course Elapsed Days: 15
Plan Fractions Treated to Date: 11
Plan Prescribed Dose Per Fraction: 2 Gy
Plan Total Fractions Prescribed: 33
Plan Total Prescribed Dose: 66 Gy
Reference Point Dosage Given to Date: 22 Gy
Reference Point Session Dosage Given: 2 Gy
Session Number: 11

## 2023-09-21 NOTE — Telephone Encounter (Signed)
 I called the patient's daughter to let her know that we were able to add the feraheme to his scheduled chemotherapy sessions. She was very Adult nurse. I will reach out to [facility] infusion center to let them know they can cancel the 9/10 and 9/17 appointments.

## 2023-09-21 NOTE — Telephone Encounter (Signed)
 Orders placed.

## 2023-09-22 ENCOUNTER — Encounter

## 2023-09-22 ENCOUNTER — Inpatient Hospital Stay
Admission: RE | Admit: 2023-09-22 | Discharge: 2023-09-22 | Disposition: A | Discharge status: Home / Self Care | Payer: Self-pay | Source: Ambulatory Visit | Attending: Radiation Oncology | Admitting: Radiation Oncology

## 2023-09-22 ENCOUNTER — Other Ambulatory Visit: Payer: Self-pay

## 2023-09-22 DIAGNOSIS — C3412 Malignant neoplasm of upper lobe, left bronchus or lung: Secondary | ICD-10-CM | POA: Diagnosis not present

## 2023-09-22 LAB — RAD ONC ARIA SESSION SUMMARY
Course Elapsed Days: 16
Plan Fractions Treated to Date: 12
Plan Prescribed Dose Per Fraction: 2 Gy
Plan Total Fractions Prescribed: 33
Plan Total Prescribed Dose: 66 Gy
Reference Point Dosage Given to Date: 24 Gy
Reference Point Session Dosage Given: 2 Gy
Session Number: 12

## 2023-09-23 NOTE — Progress Notes (Unsigned)
 [facility] OFFICE PROGRESS NOTE  Christopher Francisco, MD [address] [place] [address]  DIAGNOSIS:  Stage IIIA (T3, N1, M0) non-small cell lung cancer, adenocarcinoma presented with large left upper lobe lung mass in addition to left hilar lymphadenopathy diagnosed in August 2025.   Molecular Studies: ***  PRIOR THERAPY: None  CURRENT THERAPY:  1) Concurrent chemoradiation with carboplatin  for an AUC of 2 and taxol  45 mg/m2. First dose on 09/19/23. Status post 1 cycle.  2) Fereheme 510 mg weekly x2.   INTERVAL HISTORY: [name] 73 y.o. male returns to clinic today for a follow-up visit.  The patient was last seen in clinic on 09/07/2023 by [name].  The patient was recently diagnosed with lung cancer. He started concurrent chemoRT last week and tolerated it ***. He did have some restless legs and his benadryl  was changed to cetirizine . He is undergoing radiation and his last day is scheduled for 10/23/23.   He recently had a port placed. He also is scheduled for IV iron with his treatment today. He is taking iron supplements and is ***.   He sometimes has lightheadednes. Denies any fever, chills, night sweats, or weight loss. Denies any chest pain, shortness of breath, Leon, or hemoptysis. Denies any nausea, vomiting, diarrhea, or constipation. Denies any headache or visual changes. Denies any rashes or skin changes. The patient is here today for evaluation prior to starting cycle # 2   MEDICAL HISTORY: Past Medical History:  Diagnosis Date   (HFpEF) heart failure with preserved ejection fraction (HCC)    Allergy    Anxiety    Arthritis    Chronic kidney disease    Depression    Frequent urination    GERD (gastroesophageal reflux disease)    Gout    Headache    mirgaines in the past   Herpes    History of nuclear stress test    Myoview  05/2021: EF 77, normal perfusion, low risk   Hyperlipidemia    Hypertension    IBS (irritable bowel syndrome)     Memory loss    MGUS (monoclonal gammopathy of unknown significance)    Obesity    Pre-diabetes    Stroke (HCC)    Urgency of urination     ALLERGIES:  is allergic to divalproex sodium, lamotrigine, valproic acid, diphenhydramine , amitriptyline, aspirin , hydrocodone , lisinopril, lithium, morphine , nsaids, oxycodone, oxycodone-acetaminophen , and pork allergy.  MEDICATIONS:  Current Outpatient Medications  Medication Sig Dispense Refill   acetaminophen  (TYLENOL ) 500 MG tablet Take 1-2 tablets (500-1,000 mg total) by mouth every 6 (six) hours as needed for moderate pain. 30 tablet 0   allopurinol  (ZYLOPRIM ) 100 MG tablet Take 150 mg by mouth daily.     amLODipine  (NORVASC ) 10 MG tablet Take 1 tablet (10 mg total) by mouth daily. 90 tablet 3   benzonatate  (TESSALON ) 200 MG capsule Take 1 capsule (200 mg total) by mouth in the morning and at bedtime. 60 capsule 2   busPIRone  (BUSPAR ) 30 MG tablet Take 30 mg by mouth 2 (two) times daily.     cetirizine  (ZYRTEC  ALLERGY) 10 MG tablet Take 1 tablet (10 mg total) by mouth daily. (Patient taking differently: Take 10 mg by mouth at bedtime.) 30 tablet 0   clindamycin (CLEOCIN) 300 MG capsule Take 300 mg by mouth 3 (three) times daily.     [Paused] clopidogrel  (PLAVIX ) 75 MG tablet Take 1 tablet (75 mg total) by mouth daily. Okay to restart this medication on  08/22/2023.     dextromethorphan  (DELSYM ) 30 MG/5ML liquid Take 5 mLs (30 mg total) by mouth 2 (two) times daily as needed for Leon. 89 mL 0   fluticasone  (FLONASE ) 50 MCG/ACT nasal spray Place 1 spray into both nostrils daily. 16 g 2   folic acid  (FOLVITE ) 1 MG tablet Take 1 mg by mouth daily.     furosemide  (LASIX ) 40 MG tablet Take 1 tablet (40 mg total) by mouth daily. 90 tablet 3   [Paused] hydrALAZINE  (APRESOLINE ) 50 MG tablet Take 1.5 tablets (75 mg total) by mouth 2 (two) times daily. 270 tablet 3   HYDROcodone  bit-homatropine (HYCODAN) 5-1.5 MG/5ML syrup Take 5 mLs by mouth every 4 (four)  hours as needed (Persistent Leon). 120 mL 0   Hydroxychloroquine  Sulfate 400 MG TABS Take 400 mg by mouth at bedtime.     Lactobacillus (ACIDOPHILUS) CAPS capsule Take 1 capsule by mouth daily. (Patient taking differently: Take 1 capsule by mouth in the morning.) 100 capsule 0   lidocaine  (LIDODERM ) 5 % Place 1 patch onto the skin daily as needed (for pain- Remove & Discard patch within 12 hours or as directed by MD).     lidocaine -prilocaine  (EMLA ) cream Apply to affected area once 30 g 3   ondansetron  (ZOFRAN ) 8 MG tablet Take 1 tablet (8 mg total) by mouth every 8 (eight) hours as needed for nausea or vomiting. Start on the third day after chemotherapy. 30 tablet 1   PARoxetine  (PAXIL -CR) 37.5 MG 24 hr tablet Take 1 tablet (37.5 mg total) by mouth at bedtime.     primidone  (MYSOLINE ) 250 MG tablet Take 1 tablet (250 mg total) by mouth 2 (two) times daily. 60 tablet 2   prochlorperazine  (COMPAZINE ) 10 MG tablet Take 1 tablet (10 mg total) by mouth every 6 (six) hours as needed for nausea or vomiting. 30 tablet 1   QUEtiapine  (SEROQUEL ) 25 MG tablet Take 25 mg by mouth at bedtime.     rosuvastatin  (CRESTOR ) 40 MG tablet Take 40 mg by mouth daily.     traZODone  (DESYREL ) 100 MG tablet Take 100 mg by mouth at bedtime.     valACYclovir  (VALTREX ) 1000 MG tablet Take 1,000 mg by mouth daily.     Vitamin D , Ergocalciferol , (DRISDOL ) 1.25 MG (50000 UNIT) CAPS capsule Take 50,000 Units by mouth every Saturday.     No current facility-administered medications for this visit.    SURGICAL HISTORY:  Past Surgical History:  Procedure Laterality Date   CHOLECYSTECTOMY N/A 12/17/2019   Procedure: LAPAROSCOPIC CHOLECYSTECTOMY WITH ICG INJECTION;  Surgeon: Christopher Cough, MD;  Location: [facility] OR;  Service: General;  Laterality: N/A;   disckec     IR IMAGING GUIDED PORT INSERTION  09/12/2023   shouler arthroscopy Left    SPINAL FUSION     TONSILLECTOMY     TOTAL KNEE ARTHROPLASTY Right 11/15/2021    Procedure: TOTAL KNEE ARTHROPLASTY;  Surgeon: Christopher Lerner, MD;  Location: [facility] ORS;  Service: Orthopedics;  Laterality: Right;   VIDEO BRONCHOSCOPY WITH ENDOBRONCHIAL NAVIGATION Left 08/21/2023   Procedure: VIDEO BRONCHOSCOPY WITH ENDOBRONCHIAL NAVIGATION;  Surgeon: Shelah Lamar RAMAN, MD;  Location: [facility] ENDOSCOPY;  Service: Pulmonary;  Laterality: Left;   VIDEO BRONCHOSCOPY WITH ENDOBRONCHIAL ULTRASOUND  08/21/2023   Procedure: BRONCHOSCOPY, WITH EBUS;  Surgeon: Shelah Lamar RAMAN, MD;  Location: [facility] ENDOSCOPY;  Service: Pulmonary;;   VIDEO BRONCHOSCOPY WITH RADIAL ENDOBRONCHIAL ULTRASOUND  08/21/2023   Procedure: VIDEO BRONCHOSCOPY WITH RADIAL ENDOBRONCHIAL ULTRASOUND;  Surgeon: Shelah Lamar RAMAN,  MD;  Location: [facility] ENDOSCOPY;  Service: Pulmonary;;   WISDOM TOOTH EXTRACTION      REVIEW OF SYSTEMS:   Review of Systems  Constitutional: Negative for appetite change, chills, fatigue, fever and unexpected weight change.  HENT:   Negative for mouth sores, nosebleeds, sore throat and trouble swallowing.   Eyes: Negative for eye problems and icterus.  Respiratory: Negative for Leon, hemoptysis, shortness of breath and wheezing.   Cardiovascular: Negative for chest pain and leg swelling.  Gastrointestinal: Negative for abdominal pain, constipation, diarrhea, nausea and vomiting.  Genitourinary: Negative for bladder incontinence, difficulty urinating, dysuria, frequency and hematuria.   Musculoskeletal: Negative for back pain, gait problem, neck pain and neck stiffness.  Skin: Negative for itching and rash.  Neurological: Negative for dizziness, extremity weakness, gait problem, headaches, light-headedness and seizures.  Hematological: Negative for adenopathy. Does not bruise/bleed easily.  Psychiatric/Behavioral: Negative for confusion, depression and sleep disturbance. The patient is not nervous/anxious.     PHYSICAL EXAMINATION:  There were no vitals taken for this visit.  ECOG PERFORMANCE STATUS: {CHL ONC  ECOG [id number]  Physical Exam  Constitutional: Oriented to person, place, and time and well-developed, well-nourished, and in no distress. No distress.  HENT:  Head: Normocephalic and atraumatic.  Mouth/Throat: Oropharynx is clear and moist. No oropharyngeal exudate.  Eyes: Conjunctivae are normal. Right eye exhibits no discharge. Left eye exhibits no discharge. No scleral icterus.  Neck: Normal range of motion. Neck supple.  Cardiovascular: Normal rate, regular rhythm, normal heart sounds and intact distal pulses.   Pulmonary/Chest: Effort normal and breath sounds normal. No respiratory distress. No wheezes. No rales.  Abdominal: Soft. Bowel sounds are normal. Exhibits no distension and no mass. There is no tenderness.  Musculoskeletal: Normal range of motion. Exhibits no edema.  Lymphadenopathy:    No cervical adenopathy.  Neurological: Alert and oriented to person, place, and time. Exhibits normal muscle tone. Gait normal. Coordination normal.  Skin: Skin is warm and dry. No rash noted. Not diaphoretic. No erythema. No pallor.  Psychiatric: Mood, memory and judgment normal.  Vitals reviewed.  LABORATORY DATA: Lab Results  Component Value Date   WBC 4.2 09/19/2023   HGB 8.0 (L) 09/19/2023   HCT 23.8 (L) 09/19/2023   MCV 90.8 09/19/2023   PLT 208 09/19/2023      Chemistry      Component Value Date/Time   NA 139 09/19/2023 0735   NA 140 07/27/2023 1454   K 3.5 09/19/2023 0735   CL 105 09/19/2023 0735   CO2 28 09/19/2023 0735   BUN 13 09/19/2023 0735   BUN 15 07/27/2023 1454   CREATININE 1.25 (H) 09/19/2023 0735      Component Value Date/Time   CALCIUM  8.6 (L) 09/19/2023 0735   ALKPHOS 54 09/19/2023 0735   AST 15 09/19/2023 0735   ALT 10 09/19/2023 0735   BILITOT 0.2 09/19/2023 0735       RADIOGRAPHIC STUDIES:  IR IMAGING GUIDED PORT INSERTION Result Date: 09/12/2023 INDICATION: Port-A-Cath needed for treatment of lung cancer. EXAM: FLUOROSCOPIC AND  ULTRASOUND GUIDED PLACEMENT OF A SUBCUTANEOUS PORT MEDICATIONS: Benadryl  25 mg ANESTHESIA/SEDATION: Moderate (conscious) sedation was employed during this procedure. A total of Versed  4 mg and fentanyl  100 mcg was administered intravenously at the order of the provider performing the procedure. Total intra-service moderate sedation time: 30 minutes. Patient's level of consciousness and vital signs were monitored continuously by radiology nurse throughout the procedure under the supervision of the provider performing the procedure. FLUOROSCOPY TIME:  Radiation Exposure Index (as provided by the fluoroscopic device): 7 mGy Kerma COMPLICATIONS: None immediate. PROCEDURE: The procedure, risks, benefits, and alternatives were explained to the patient. Questions regarding the procedure were encouraged and answered. The patient understands and consents to the procedure. Patient was placed supine on the interventional table. Ultrasound confirmed a patent right internal jugular vein. Ultrasound image was saved for documentation. The right chest and neck were cleaned with a skin antiseptic and a sterile drape was placed. Maximal barrier sterile technique was utilized including caps, mask, sterile gowns, sterile gloves, sterile drape, hand hygiene and skin antiseptic. The right neck was anesthetized with 1% lidocaine . Small incision was made in the right neck with a blade. Micropuncture set was placed in the right internal jugular vein with ultrasound guidance. The micropuncture wire was used for measurement purposes. The right chest was anesthetized with 1% lidocaine  with epinephrine . #15 blade was used to make an incision and a subcutaneous port pocket was formed. 8 french Power Port was assembled. Subcutaneous tunnel was formed with a stiff tunneling device. The port catheter was brought through the subcutaneous tunnel. The port was placed in the subcutaneous pocket. The micropuncture set was exchanged for a peel-away  sheath. The catheter was placed through the peel-away sheath and the tip was positioned at the superior cavoatrial junction. Catheter placement was confirmed with fluoroscopy. The port was accessed and flushed with saline. The port pocket was closed using two layers of absorbable sutures and Dermabond. The vein skin site was closed using a single layer of absorbable suture and Dermabond. Sterile dressings were applied. Patient tolerated the procedure well without an immediate complication. Ultrasound and fluoroscopic images were taken and saved for this procedure. IMPRESSION: Placement of a subcutaneous power-injectable port device. Catheter tip at the superior cavoatrial junction. Electronically Signed   By: Juliene Balder M.D.   On: 09/12/2023 17:02   DG Chest Port 1 View Result Date: 08/29/2023 CLINICAL DATA:  Shortness of breath. EXAM: PORTABLE CHEST 1 VIEW COMPARISON:  Chest CTA 08/27/2023 chest radiograph 08/27/2023 FINDINGS: Again noted is the mass in the left upper lobe/left suprahilar region. Hazy densities in the left lung most likely associated with some volume loss. Right lung remains clear. Heart size is stable and within normal limits. Trachea is midline. Negative for a pneumothorax. No acute bone abnormality. IMPRESSION: 1. Persistent mass in the left upper lobe. 2. Hazy densities in the left lung are most compatible with volume loss. Electronically Signed   By: Juliene Balder M.D.   On: 08/29/2023 08:28   MR BRAIN W WO CONTRAST Result Date: 08/28/2023 CLINICAL DATA:  Metastatic disease evaluation EXAM: MRI HEAD WITHOUT AND WITH CONTRAST TECHNIQUE: Multiplanar, multiecho pulse sequences of the brain and surrounding structures were obtained without and with intravenous contrast. CONTRAST:  8mL GADAVIST  GADOBUTROL  1 MMOL/ML IV SOLN COMPARISON:  None Available. FINDINGS: MRI brain: There are multiple foci of encephalomalacia in the periventricular white matter of both hemispheres, left more than right with  corpus callosal atrophy. There is no abnormal enhancement. No evidence of metastatic disease. There is no acute or chronic infarct. The ventricles are normal. No mass lesion. There are normal flow signals in the carotid arteries and basilar artery. No significant bone marrow signal abnormality. No significant abnormality in the paranasal sinuses or soft tissues. IMPRESSION: 1. There is no evidence of metastatic disease 2. There are multiple periventricular foci of encephalomalacia, left hemisphere more than the right, with atrophy of the corpus callosum. The pattern of  disease is suggestive of chronic demyelination Electronically Signed   By: Nancyann Burns M.D.   On: 08/28/2023 11:54   CT Angio Chest PE W and/or Wo Contrast Result Date: 08/27/2023 CLINICAL DATA:  Hemoptysis.  Lung mass.  * Tracking Code: BO * EXAM: CT ANGIOGRAPHY CHEST WITH CONTRAST TECHNIQUE: Multidetector CT imaging of the chest was performed using the standard protocol during bolus administration of intravenous contrast. Multiplanar CT image reconstructions and MIPs were obtained to evaluate the vascular anatomy. RADIATION DOSE REDUCTION: This exam was performed according to the departmental dose-optimization program which includes automated exposure control, adjustment of the mA and/or kV according to patient size and/or use of iterative reconstruction technique. CONTRAST:  75mL OMNIPAQUE  IOHEXOL  350 MG/ML SOLN COMPARISON:  PET 07/26/2023 and CT chest 06/30/2023. FINDINGS: Cardiovascular: Negative for pulmonary embolus. Atherosclerotic calcification of the aorta. Heart is at the upper limits of normal in size to mildly enlarged. No pericardial effusion. Enlarged pulmonic trunk. Mediastinum/Nodes: Left hilar adenopathy measures up to 1.5 cm. No pathologically enlarged mediastinal or axillary lymph nodes. Air in the esophagus can be seen with dysmotility. Lungs/Pleura: Left upper lobe mass measures approximately 5.0 x 5.6 cm, stable to minimally  enlarged from 4.5 x 5.2 cm on 06/30/2023. Long border of contact with the adjacent mediastinum with extension to the left hilum. 10 mm ground-glass nodule in the posterolateral right lower lobe (12/77), unchanged. Newly obstructed superior segmental left lower lobe bronchus with mild postobstructive ground-glass nodularity in the left lower lobe. No pleural fluid. Airway is otherwise unremarkable. Upper Abdomen: Hepatic cysts. Cholecystectomy. Low-attenuation lesions in the kidneys. No specific follow-up necessary. Elevated left hemidiaphragm. Visualized portions of the liver, adrenal glands, kidneys, spleen, pancreas, stomach and bowel are otherwise grossly unremarkable. No upper abdominal adenopathy. Musculoskeletal: Degenerative changes in the spine. Review of the MIP images confirms the above findings. IMPRESSION: 1. Negative for pulmonary embolus. 2. Similar to minimally enlarged left upper lobe mass with left hilar adenopathy, compatible with primary bronchogenic carcinoma. 3. New obstruction of the superior segmental left lower lobe bronchus with postobstructive ground-glass nodularity in the left lower lobe. 4. Stable 10 mm ground-glass right lower lobe nodule. Recommend attention on follow-up. 5.  Aortic atherosclerosis (ICD10-I70.0). 6. Electronically Signed   By: Newell Eke M.D.   On: 08/27/2023 17:27   DG Chest Port 1 View Result Date: 08/27/2023 CLINICAL DATA:  Hemoptysis.  Recent bronchoscopy with biopsy. EXAM: PORTABLE CHEST 1 VIEW COMPARISON:  Radiograph 08/21/2023.  PET CT 07/26/2023 FINDINGS: Unchanged mild volume loss in the left hemithorax with left suprahilar opacity corresponding to hypermetabolic mass on PET-CT. No pneumothorax. Stable heart size and mediastinal contours. No new airspace disease. No pulmonary edema. No pleural effusion. IMPRESSION: Unchanged mild volume loss in the left hemithorax with left suprahilar opacity corresponding to hypermetabolic mass on PET-CT. No  pneumothorax or new findings. Electronically Signed   By: Andrea Gasman M.D.   On: 08/27/2023 14:05     ASSESSMENT/PLAN:  This is a very pleasant 73 year old African-American male with a stage IIIA (T3, N1, M0) non-small cell lung cancer, adenocarcinoma presented with large left upper lobe lung mass in addition to left hilar lymphadenopathy diagnosed in August 2025. Molecular studies ***  He is currently undergoing concurrent chemoRT with carboplatin  for an AUC of 2 and taxol  45 mg/m2. He is status post one cycle. The last day of radiation is scheduled for 10/23/23.   Labs were reviewed. Recommend he *** with cycle #2 today as scheduled. He is  also scheduled for feraheme.   We will see him in 2 weeks before undergoing cycle #4.   He will continue to take his iron supplement.   The patient was advised to call immediately if she has any concerning symptoms in the interval. The patient voices understanding of current disease status and treatment options and is in agreement with the current care plan. All questions were answered. The patient knows to call the clinic with any problems, questions or concerns. We can certainly see the patient much sooner if necessary       No orders of the defined types were placed in this encounter.    I spent {CHL ONC TIME VISIT - DTPQU:[id number]} counseling the patient face to face. The total time spent in the appointment was {CHL ONC TIME VISIT - DTPQU:[id number]}.  Christopher Sans L Tenee Wish, PA-C 09/23/23

## 2023-09-25 ENCOUNTER — Inpatient Hospital Stay
Admission: RE | Admit: 2023-09-25 | Discharge: 2023-09-25 | Disposition: A | Discharge status: Home / Self Care | Payer: Self-pay | Source: Ambulatory Visit | Attending: Radiation Oncology | Admitting: Radiation Oncology

## 2023-09-25 ENCOUNTER — Ambulatory Visit
Admission: RE | Admit: 2023-09-25 | Discharge: 2023-09-25 | Disposition: A | Discharge status: Home / Self Care | Source: Ambulatory Visit | Attending: Radiation Oncology | Admitting: Radiation Oncology

## 2023-09-25 ENCOUNTER — Telehealth: Payer: Self-pay | Admitting: Medical Oncology

## 2023-09-25 ENCOUNTER — Other Ambulatory Visit: Payer: Self-pay

## 2023-09-25 ENCOUNTER — Other Ambulatory Visit

## 2023-09-25 ENCOUNTER — Inpatient Hospital Stay (HOSPITAL_BASED_OUTPATIENT_CLINIC_OR_DEPARTMENT_OTHER): Admitting: Physician Assistant

## 2023-09-25 ENCOUNTER — Encounter: Payer: Self-pay | Admitting: Internal Medicine

## 2023-09-25 ENCOUNTER — Other Ambulatory Visit: Payer: Self-pay | Admitting: Medical Oncology

## 2023-09-25 ENCOUNTER — Inpatient Hospital Stay

## 2023-09-25 VITALS — BP 138/72 | HR 75 | Temp 97.6°F | Resp 20 | Wt 197.2 lb

## 2023-09-25 DIAGNOSIS — D649 Anemia, unspecified: Secondary | ICD-10-CM

## 2023-09-25 DIAGNOSIS — D509 Iron deficiency anemia, unspecified: Secondary | ICD-10-CM | POA: Diagnosis not present

## 2023-09-25 DIAGNOSIS — C3412 Malignant neoplasm of upper lobe, left bronchus or lung: Secondary | ICD-10-CM | POA: Diagnosis not present

## 2023-09-25 LAB — RAD ONC ARIA SESSION SUMMARY
Course Elapsed Days: 19
Plan Fractions Treated to Date: 13
Plan Prescribed Dose Per Fraction: 2 Gy
Plan Total Fractions Prescribed: 33
Plan Total Prescribed Dose: 66 Gy
Reference Point Dosage Given to Date: 26 Gy
Reference Point Session Dosage Given: 2 Gy
Session Number: 13

## 2023-09-25 LAB — CBC WITH DIFFERENTIAL (CANCER CENTER ONLY)
Abs Immature Granulocytes: 0.01 K/uL (ref 0.00–0.07)
Basophils Absolute: 0 K/uL (ref 0.0–0.1)
Basophils Relative: 1 %
Eosinophils Absolute: 0.1 K/uL (ref 0.0–0.5)
Eosinophils Relative: 2 %
HCT: 23.8 % — ABNORMAL LOW (ref 39.0–52.0)
Hemoglobin: 7.9 g/dL — ABNORMAL LOW (ref 13.0–17.0)
Immature Granulocytes: 0 %
Lymphocytes Relative: 15 %
Lymphs Abs: 0.5 K/uL — ABNORMAL LOW (ref 0.7–4.0)
MCH: 29.9 pg (ref 26.0–34.0)
MCHC: 33.2 g/dL (ref 30.0–36.0)
MCV: 90.2 fL (ref 80.0–100.0)
Monocytes Absolute: 0.2 K/uL (ref 0.1–1.0)
Monocytes Relative: 7 %
Neutro Abs: 2.6 K/uL (ref 1.7–7.7)
Neutrophils Relative %: 75 %
Platelet Count: 184 K/uL (ref 150–400)
RBC: 2.64 MIL/uL — ABNORMAL LOW (ref 4.22–5.81)
RDW: 13.3 % (ref 11.5–15.5)
WBC Count: 3.5 K/uL — ABNORMAL LOW (ref 4.0–10.5)
nRBC: 0 % (ref 0.0–0.2)

## 2023-09-25 LAB — CMP (CANCER CENTER ONLY)
ALT: 9 U/L (ref 0–44)
AST: 15 U/L (ref 15–41)
Albumin: 4.1 g/dL (ref 3.5–5.0)
Alkaline Phosphatase: 54 U/L (ref 38–126)
Anion gap: 5 (ref 5–15)
BUN: 11 mg/dL (ref 8–23)
CO2: 29 mmol/L (ref 22–32)
Calcium: 8.6 mg/dL — ABNORMAL LOW (ref 8.9–10.3)
Chloride: 107 mmol/L (ref 98–111)
Creatinine: 1.24 mg/dL (ref 0.61–1.24)
GFR, Estimated: >60 mL/min (ref >60)
Glucose, Bld: 112 mg/dL — ABNORMAL HIGH (ref 70–99)
Potassium: 3.3 mmol/L — ABNORMAL LOW (ref 3.5–5.1)
Sodium: 141 mmol/L (ref 135–145)
Total Bilirubin: 0.3 mg/dL (ref 0.0–1.2)
Total Protein: 7.1 g/dL (ref 6.5–8.1)

## 2023-09-25 LAB — SAMPLE TO BLOOD BANK

## 2023-09-25 MED ORDER — THERA VITAL M PO TABS: 1.0000 | ORAL_TABLET | Freq: Every day | ORAL | 1 refills | Status: DC

## 2023-09-25 MED ORDER — CENTRUM FRESH/FRUITY ADULT PO CHEW: 1.0000 | CHEWABLE_TABLET | Freq: Every day | ORAL | 2 refills | Status: DC

## 2023-09-25 MED ORDER — FERROUS SULFATE 325 (65 FE) MG PO TBEC: 325.0000 mg | DELAYED_RELEASE_TABLET | Freq: Every day | ORAL | 3 refills | Status: DC

## 2023-09-25 NOTE — Telephone Encounter (Signed)
 Dtr requested Dr Sherrod to send generic MVI instead of centrum MVI. Done.

## 2023-09-26 ENCOUNTER — Other Ambulatory Visit: Payer: Self-pay

## 2023-09-26 ENCOUNTER — Encounter

## 2023-09-26 ENCOUNTER — Other Ambulatory Visit: Payer: Self-pay | Admitting: Internal Medicine

## 2023-09-26 ENCOUNTER — Inpatient Hospital Stay
Admission: RE | Admit: 2023-09-26 | Discharge: 2023-09-26 | Disposition: A | Discharge status: Home / Self Care | Payer: Self-pay | Source: Ambulatory Visit | Attending: Radiation Oncology | Admitting: Radiation Oncology

## 2023-09-26 ENCOUNTER — Other Ambulatory Visit: Payer: Self-pay | Admitting: Medical Oncology

## 2023-09-26 ENCOUNTER — Inpatient Hospital Stay

## 2023-09-26 VITALS — BP 132/72 | HR 79 | Temp 98.0°F | Resp 20

## 2023-09-26 DIAGNOSIS — C3412 Malignant neoplasm of upper lobe, left bronchus or lung: Secondary | ICD-10-CM

## 2023-09-26 DIAGNOSIS — F419 Anxiety disorder, unspecified: Secondary | ICD-10-CM

## 2023-09-26 LAB — RAD ONC ARIA SESSION SUMMARY
Course Elapsed Days: 20
Plan Fractions Treated to Date: 14
Plan Prescribed Dose Per Fraction: 2 Gy
Plan Total Fractions Prescribed: 33
Plan Total Prescribed Dose: 66 Gy
Reference Point Dosage Given to Date: 28 Gy
Reference Point Session Dosage Given: 2 Gy
Session Number: 14

## 2023-09-26 MED ORDER — SODIUM CHLORIDE 0.9 % IV SOLN
45.0000 mg/m2 | Freq: Once | INTRAVENOUS | Status: AC
  Administered 2023-09-26: 90 mg via INTRAVENOUS
  Filled 2023-09-26: qty 15

## 2023-09-26 MED ORDER — DEXAMETHASONE SODIUM PHOSPHATE 10 MG/ML IJ SOLN
10.0000 mg | Freq: Once | INTRAMUSCULAR | Status: AC
  Administered 2023-09-26: 10 mg via INTRAVENOUS
  Filled 2023-09-26: qty 1

## 2023-09-26 MED ORDER — SODIUM CHLORIDE 0.9 % IV SOLN
190.0000 mg | Freq: Once | INTRAVENOUS | Status: AC
  Administered 2023-09-26: 190 mg via INTRAVENOUS
  Filled 2023-09-26: qty 19

## 2023-09-26 MED ORDER — SODIUM CHLORIDE 0.9 % IV SOLN: INTRAVENOUS | Status: DC

## 2023-09-26 MED ORDER — SODIUM CHLORIDE 0.9% FLUSH
10.0000 mL | INTRAVENOUS | Status: DC | PRN
  Administered 2023-09-26: 10 mL

## 2023-09-26 MED ORDER — ALPRAZOLAM 0.25 MG PO TABS: 0.2500 mg | ORAL_TABLET | Freq: Once | ORAL | Status: DC

## 2023-09-26 MED ORDER — SODIUM CHLORIDE 0.9 % IV SOLN
510.0000 mg | INTRAVENOUS | Status: DC
  Administered 2023-09-26: 510 mg via INTRAVENOUS
  Filled 2023-09-26: qty 510

## 2023-09-26 MED ORDER — PALONOSETRON HCL INJECTION 0.25 MG/5ML
0.2500 mg | Freq: Once | INTRAVENOUS | Status: AC
  Administered 2023-09-26: 0.25 mg via INTRAVENOUS
  Filled 2023-09-26: qty 5

## 2023-09-26 MED ORDER — CETIRIZINE HCL 10 MG/ML IV SOLN
10.0000 mg | Freq: Once | INTRAVENOUS | Status: AC
  Administered 2023-09-26: 10 mg via INTRAVENOUS
  Filled 2023-09-26: qty 1

## 2023-09-26 MED ORDER — FAMOTIDINE IN NACL 20-0.9 MG/50ML-% IV SOLN
20.0000 mg | Freq: Once | INTRAVENOUS | Status: AC
  Administered 2023-09-26: 20 mg via INTRAVENOUS
  Filled 2023-09-26: qty 50

## 2023-09-26 MED ORDER — LORAZEPAM 1 MG PO TABS
0.5000 mg | ORAL_TABLET | Freq: Once | ORAL | Status: AC
  Administered 2023-09-26: 0.5 mg via ORAL
  Filled 2023-09-26: qty 1

## 2023-09-26 NOTE — Progress Notes (Signed)
 Increase carbo dose to 190mg  based off AUC dosing with today's labs and change carbo dose for subsequent cycles to calculate based off of AUC dosing per Dr. Sherrod.  Viktoria Gruetzmacher, PharmD, MBA

## 2023-09-26 NOTE — Patient Instructions (Signed)
 [facility] - A DEPT OF [facility]  Discharge Instructions: Thank you for choosing [facility] to provide your oncology and hematology care.   If you have a lab appointment with the Cancer Center, please go directly to the Cancer Center and check in at the registration area.   Wear comfortable clothing and clothing appropriate for easy access to any Portacath or PICC line.   We strive to give you quality time with your provider. You may need to reschedule your appointment if you arrive late (15 or more minutes).  Arriving late affects you and other patients whose appointments are after yours.  Also, if you miss three or more appointments without notifying the office, you may be dismissed from the clinic at the provider's discretion.      For prescription refill requests, have your pharmacy contact our office and allow 72 hours for refills to be completed.    Today you received the following chemotherapy and/or immunotherapy agents: Paclitaxel  (Taxol ) & Carboplatin  (Paraplatin )    To help prevent nausea and vomiting after your treatment, we encourage you to take your nausea medication as directed.  BELOW ARE SYMPTOMS THAT SHOULD BE REPORTED IMMEDIATELY: *FEVER GREATER THAN 100.4 F (38 C) OR HIGHER *CHILLS OR SWEATING *NAUSEA AND VOMITING THAT IS NOT CONTROLLED WITH YOUR NAUSEA MEDICATION *UNUSUAL SHORTNESS OF BREATH *UNUSUAL BRUISING OR BLEEDING *URINARY PROBLEMS (pain or burning when urinating, or frequent urination) *BOWEL PROBLEMS (unusual diarrhea, constipation, pain near the anus) TENDERNESS IN MOUTH AND THROAT WITH OR WITHOUT PRESENCE OF ULCERS (sore throat, sores in mouth, or a toothache) UNUSUAL RASH, SWELLING OR PAIN  UNUSUAL VAGINAL DISCHARGE OR ITCHING   Items with * indicate a potential emergency and should be followed up as soon as possible or go to the Emergency Department if any problems should occur.  Please show the  CHEMOTHERAPY ALERT CARD or IMMUNOTHERAPY ALERT CARD at check-in to the Emergency Department and triage nurse.  Should you have questions after your visit or need to cancel or reschedule your appointment, please contact [facility] - [facility]  Dept: [phone]  and follow the prompts.  Office hours are 8:00 a.m. to 4:30 p.m. Monday - Friday. Please note that voicemails left after 4:00 p.m. may not be returned until the following business day.  We are closed weekends and major holidays. You have access to a nurse at all times for urgent questions. Please call the main number to the clinic Dept: [phone] and follow the prompts.   For any non-urgent questions, you may also contact your provider using MyChart. We now offer e-Visits for anyone 95 and older to request care online for non-urgent symptoms. For details visit [email or web address].   Also download the MyChart app! Go to the app store, search MyChart, open the app, select [place], and log in with your MyChart username and password.  Ferumoxytol  Injection What is this medication? FERUMOXYTOL  (FER ue MOX i tol) treats low levels of iron in your body (iron deficiency anemia). Iron is a mineral that plays an important role in making red blood cells, which carry oxygen from your lungs to the rest of your body. This medicine may be used for other purposes; ask your health care provider or pharmacist if you have questions. COMMON BRAND NAME(S): Feraheme What should I tell my care team before I take this medication? They need to know if you  have any of these conditions: Anemia not caused by low iron levels High levels of iron in the blood Magnetic resonance imaging (MRI) test scheduled An unusual or allergic reaction to iron, other medications, foods, dyes, or preservatives Pregnant or trying to get pregnant Breastfeeding How should I use this medication? This medication is injected into  a vein. It is given by your care team in a hospital or clinic setting. Talk to your care team the use of this medication in children. Special care may be needed. Overdosage: If you think you have taken too much of this medicine contact a poison control center or emergency room at once. NOTE: This medicine is only for you. Do not share this medicine with others. What if I miss a dose? It is important not to miss your dose. Call your care team if you are unable to keep an appointment. What may interact with this medication? Other iron products This list may not describe all possible interactions. Give your health care provider a list of all the medicines, herbs, non-prescription drugs, or dietary supplements you use. Also tell them if you smoke, drink alcohol , or use illegal drugs. Some items may interact with your medicine. What should I watch for while using this medication? Visit your care team for regular checks on your progress. Tell your care team if your symptoms do not start to get better or if they get worse. You may need blood work done while you are taking this medication. You may need to eat more foods that contain iron. Talk to your care team. Foods that contain iron include whole grains or cereals, dried fruits, beans, peas, leafy green vegetables, and organ meats (liver, kidney). What side effects may I notice from receiving this medication? Side effects that you should report to your care team as soon as possible: Allergic reactions--skin rash, itching, hives, swelling of the face, lips, tongue, or throat Low blood pressure--dizziness, feeling faint or lightheaded, blurry vision Shortness of breath Side effects that usually do not require medical attention (report to your care team if they continue or are bothersome): Flushing Headache Joint pain Muscle pain Nausea Pain, redness, or irritation at injection site This list may not describe all possible side effects. Call your  doctor for medical advice about side effects. You may report side effects to FDA at 1-800-FDA-1088. Where should I keep my medication? This medication is given in a hospital or clinic. It will not be stored at home. NOTE: This sheet is a summary. It may not cover all possible information. If you have questions about this medicine, talk to your doctor, pharmacist, or health care provider.  2024 Elsevier/Gold Standard (2022-08-24 00:00:00)

## 2023-09-27 ENCOUNTER — Ambulatory Visit

## 2023-09-27 ENCOUNTER — Other Ambulatory Visit: Payer: Self-pay

## 2023-09-27 ENCOUNTER — Ambulatory Visit
Admission: RE | Admit: 2023-09-27 | Discharge: 2023-09-27 | Disposition: A | Discharge status: Home / Self Care | Payer: Self-pay | Source: Ambulatory Visit | Attending: Radiation Oncology

## 2023-09-27 ENCOUNTER — Inpatient Hospital Stay

## 2023-09-27 DIAGNOSIS — C3412 Malignant neoplasm of upper lobe, left bronchus or lung: Secondary | ICD-10-CM | POA: Diagnosis not present

## 2023-09-27 LAB — RAD ONC ARIA SESSION SUMMARY
Course Elapsed Days: 21
Plan Fractions Treated to Date: 15
Plan Prescribed Dose Per Fraction: 2 Gy
Plan Total Fractions Prescribed: 33
Plan Total Prescribed Dose: 66 Gy
Reference Point Dosage Given to Date: 30 Gy
Reference Point Session Dosage Given: 2 Gy
Session Number: 15

## 2023-09-28 ENCOUNTER — Ambulatory Visit
Admission: RE | Admit: 2023-09-28 | Discharge: 2023-09-28 | Disposition: A | Discharge status: Home / Self Care | Source: Ambulatory Visit | Attending: Radiation Oncology | Admitting: Radiation Oncology

## 2023-09-28 ENCOUNTER — Inpatient Hospital Stay

## 2023-09-28 ENCOUNTER — Inpatient Hospital Stay
Admission: RE | Admit: 2023-09-28 | Discharge: 2023-09-28 | Disposition: A | Discharge status: Home / Self Care | Payer: Self-pay | Source: Ambulatory Visit | Attending: Radiation Oncology | Admitting: Radiation Oncology

## 2023-09-28 ENCOUNTER — Other Ambulatory Visit: Payer: Self-pay

## 2023-09-28 DIAGNOSIS — C3412 Malignant neoplasm of upper lobe, left bronchus or lung: Secondary | ICD-10-CM

## 2023-09-28 DIAGNOSIS — D649 Anemia, unspecified: Secondary | ICD-10-CM

## 2023-09-28 DIAGNOSIS — F419 Anxiety disorder, unspecified: Secondary | ICD-10-CM

## 2023-09-28 LAB — RAD ONC ARIA SESSION SUMMARY
Course Elapsed Days: 22
Plan Fractions Treated to Date: 16
Plan Prescribed Dose Per Fraction: 2 Gy
Plan Total Fractions Prescribed: 33
Plan Total Prescribed Dose: 66 Gy
Reference Point Dosage Given to Date: 32 Gy
Reference Point Session Dosage Given: 2 Gy
Session Number: 16

## 2023-09-28 LAB — CMP (CANCER CENTER ONLY)
ALT: 10 U/L (ref 0–44)
AST: 18 U/L (ref 15–41)
Albumin: 4.2 g/dL (ref 3.5–5.0)
Alkaline Phosphatase: 54 U/L (ref 38–126)
Anion gap: 6 (ref 5–15)
BUN: 18 mg/dL (ref 8–23)
CO2: 27 mmol/L (ref 22–32)
Calcium: 8.9 mg/dL (ref 8.9–10.3)
Chloride: 106 mmol/L (ref 98–111)
Creatinine: 1.54 mg/dL — ABNORMAL HIGH (ref 0.61–1.24)
GFR, Estimated: 48 mL/min — ABNORMAL LOW (ref >60)
Glucose, Bld: 105 mg/dL — ABNORMAL HIGH (ref 70–99)
Potassium: 3.4 mmol/L — ABNORMAL LOW (ref 3.5–5.1)
Sodium: 139 mmol/L (ref 135–145)
Total Bilirubin: 0.3 mg/dL (ref 0.0–1.2)
Total Protein: 7.4 g/dL (ref 6.5–8.1)

## 2023-09-28 LAB — CBC WITH DIFFERENTIAL (CANCER CENTER ONLY)
Abs Immature Granulocytes: 0.01 K/uL (ref 0.00–0.07)
Basophils Absolute: 0 K/uL (ref 0.0–0.1)
Basophils Relative: 0 %
Eosinophils Absolute: 0 K/uL (ref 0.0–0.5)
Eosinophils Relative: 1 %
HCT: 26.4 % — ABNORMAL LOW (ref 39.0–52.0)
Hemoglobin: 8.8 g/dL — ABNORMAL LOW (ref 13.0–17.0)
Immature Granulocytes: 0 %
Lymphocytes Relative: 9 %
Lymphs Abs: 0.4 K/uL — ABNORMAL LOW (ref 0.7–4.0)
MCH: 29.9 pg (ref 26.0–34.0)
MCHC: 33.3 g/dL (ref 30.0–36.0)
MCV: 89.8 fL (ref 80.0–100.0)
Monocytes Absolute: 0.2 K/uL (ref 0.1–1.0)
Monocytes Relative: 5 %
Neutro Abs: 3.4 K/uL (ref 1.7–7.7)
Neutrophils Relative %: 85 %
Platelet Count: 198 K/uL (ref 150–400)
RBC: 2.94 MIL/uL — ABNORMAL LOW (ref 4.22–5.81)
RDW: 14.1 % (ref 11.5–15.5)
WBC Count: 4 K/uL (ref 4.0–10.5)
nRBC: 0.7 % — ABNORMAL HIGH (ref 0.0–0.2)

## 2023-09-28 MED ORDER — LORAZEPAM 1 MG PO TABS
0.5000 mg | ORAL_TABLET | Freq: Once | ORAL | Status: AC
  Administered 2023-09-28: 0.5 mg via ORAL

## 2023-09-28 MED ORDER — SODIUM CHLORIDE 0.9 % IV SOLN
INTRAVENOUS | Status: DC
  Filled 2023-09-28 (×2): qty 150

## 2023-09-28 MED ORDER — LORAZEPAM 1 MG PO TABS
ORAL_TABLET | ORAL | Status: AC
  Filled 2023-09-28: qty 1

## 2023-09-29 ENCOUNTER — Inpatient Hospital Stay

## 2023-09-29 ENCOUNTER — Other Ambulatory Visit: Payer: Self-pay

## 2023-09-29 ENCOUNTER — Inpatient Hospital Stay
Admission: RE | Admit: 2023-09-29 | Discharge: 2023-09-29 | Disposition: A | Discharge status: Home / Self Care | Payer: Self-pay | Source: Ambulatory Visit | Attending: Radiation Oncology | Admitting: Radiation Oncology

## 2023-09-29 DIAGNOSIS — C3412 Malignant neoplasm of upper lobe, left bronchus or lung: Secondary | ICD-10-CM | POA: Diagnosis not present

## 2023-09-29 LAB — RAD ONC ARIA SESSION SUMMARY
Course Elapsed Days: 23
Plan Fractions Treated to Date: 17
Plan Prescribed Dose Per Fraction: 2 Gy
Plan Total Fractions Prescribed: 33
Plan Total Prescribed Dose: 66 Gy
Reference Point Dosage Given to Date: 34 Gy
Reference Point Session Dosage Given: 2 Gy
Session Number: 17

## 2023-10-02 ENCOUNTER — Telehealth: Payer: Self-pay | Admitting: Medical Oncology

## 2023-10-02 ENCOUNTER — Ambulatory Visit
Admission: RE | Admit: 2023-10-02 | Discharge: 2023-10-02 | Disposition: A | Discharge status: Home / Self Care | Payer: Self-pay | Source: Ambulatory Visit | Attending: Radiation Oncology | Admitting: Radiation Oncology

## 2023-10-02 ENCOUNTER — Other Ambulatory Visit: Payer: Self-pay

## 2023-10-02 ENCOUNTER — Ambulatory Visit
Admission: RE | Admit: 2023-10-02 | Discharge: 2023-10-02 | Disposition: A | Discharge status: Home / Self Care | Source: Ambulatory Visit | Attending: Radiation Oncology

## 2023-10-02 ENCOUNTER — Inpatient Hospital Stay

## 2023-10-02 ENCOUNTER — Other Ambulatory Visit: Payer: Self-pay | Admitting: Physician Assistant

## 2023-10-02 DIAGNOSIS — C3412 Malignant neoplasm of upper lobe, left bronchus or lung: Secondary | ICD-10-CM | POA: Diagnosis not present

## 2023-10-02 LAB — RAD ONC ARIA SESSION SUMMARY
Course Elapsed Days: 26
Plan Fractions Treated to Date: 18
Plan Prescribed Dose Per Fraction: 2 Gy
Plan Total Fractions Prescribed: 33
Plan Total Prescribed Dose: 66 Gy
Reference Point Dosage Given to Date: 36 Gy
Reference Point Session Dosage Given: 2 Gy
Session Number: 18

## 2023-10-02 NOTE — Telephone Encounter (Signed)
 Nausea. He asked  what antiemetic he should take. He is on his way to [facility].  I instructed him to take the ondansetron  now and the prochlorperazine  very 6 hours in between the zofran ,

## 2023-10-03 ENCOUNTER — Inpatient Hospital Stay

## 2023-10-03 ENCOUNTER — Other Ambulatory Visit: Payer: Self-pay

## 2023-10-03 ENCOUNTER — Ambulatory Visit
Admission: RE | Admit: 2023-10-03 | Discharge: 2023-10-03 | Disposition: A | Discharge status: Home / Self Care | Payer: Self-pay | Source: Ambulatory Visit | Attending: Radiation Oncology | Admitting: Radiation Oncology

## 2023-10-03 ENCOUNTER — Ambulatory Visit

## 2023-10-03 VITALS — BP 141/79 | HR 78 | Temp 98.1°F | Resp 18 | Ht 67.0 in | Wt 195.0 lb

## 2023-10-03 DIAGNOSIS — C3412 Malignant neoplasm of upper lobe, left bronchus or lung: Secondary | ICD-10-CM

## 2023-10-03 DIAGNOSIS — D508 Other iron deficiency anemias: Secondary | ICD-10-CM

## 2023-10-03 DIAGNOSIS — D649 Anemia, unspecified: Secondary | ICD-10-CM

## 2023-10-03 LAB — CBC WITH DIFFERENTIAL (CANCER CENTER ONLY)
Abs Immature Granulocytes: 0.02 K/uL (ref 0.00–0.07)
Basophils Absolute: 0 K/uL (ref 0.0–0.1)
Basophils Relative: 1 %
Eosinophils Absolute: 0 K/uL (ref 0.0–0.5)
Eosinophils Relative: 1 %
HCT: 26.2 % — ABNORMAL LOW (ref 39.0–52.0)
Hemoglobin: 8.6 g/dL — ABNORMAL LOW (ref 13.0–17.0)
Immature Granulocytes: 1 %
Lymphocytes Relative: 11 %
Lymphs Abs: 0.4 K/uL — ABNORMAL LOW (ref 0.7–4.0)
MCH: 29.8 pg (ref 26.0–34.0)
MCHC: 32.8 g/dL (ref 30.0–36.0)
MCV: 90.7 fL (ref 80.0–100.0)
Monocytes Absolute: 0.3 K/uL (ref 0.1–1.0)
Monocytes Relative: 7 %
Neutro Abs: 3 K/uL (ref 1.7–7.7)
Neutrophils Relative %: 79 %
Platelet Count: 257 K/uL (ref 150–400)
RBC: 2.89 MIL/uL — ABNORMAL LOW (ref 4.22–5.81)
RDW: 15.7 % — ABNORMAL HIGH (ref 11.5–15.5)
WBC Count: 3.8 K/uL — ABNORMAL LOW (ref 4.0–10.5)
nRBC: 0 % (ref 0.0–0.2)

## 2023-10-03 LAB — CMP (CANCER CENTER ONLY)
ALT: 11 U/L (ref 0–44)
AST: 18 U/L (ref 15–41)
Albumin: 4.2 g/dL (ref 3.5–5.0)
Alkaline Phosphatase: 59 U/L (ref 38–126)
Anion gap: 5 (ref 5–15)
BUN: 13 mg/dL (ref 8–23)
CO2: 28 mmol/L (ref 22–32)
Calcium: 8.8 mg/dL — ABNORMAL LOW (ref 8.9–10.3)
Chloride: 105 mmol/L (ref 98–111)
Creatinine: 1.35 mg/dL — ABNORMAL HIGH (ref 0.61–1.24)
GFR, Estimated: 56 mL/min — ABNORMAL LOW (ref >60)
Glucose, Bld: 97 mg/dL (ref 70–99)
Potassium: 3.5 mmol/L (ref 3.5–5.1)
Sodium: 138 mmol/L (ref 135–145)
Total Bilirubin: 0.3 mg/dL (ref 0.0–1.2)
Total Protein: 7.2 g/dL (ref 6.5–8.1)

## 2023-10-03 LAB — RAD ONC ARIA SESSION SUMMARY
Course Elapsed Days: 27
Plan Fractions Treated to Date: 19
Plan Prescribed Dose Per Fraction: 2 Gy
Plan Total Fractions Prescribed: 33
Plan Total Prescribed Dose: 66 Gy
Reference Point Dosage Given to Date: 38 Gy
Reference Point Session Dosage Given: 2 Gy
Session Number: 19

## 2023-10-03 LAB — SAMPLE TO BLOOD BANK

## 2023-10-03 MED ORDER — DEXAMETHASONE SODIUM PHOSPHATE 10 MG/ML IJ SOLN
10.0000 mg | Freq: Once | INTRAMUSCULAR | Status: AC
  Administered 2023-10-03: 10 mg via INTRAVENOUS
  Filled 2023-10-03: qty 1

## 2023-10-03 MED ORDER — CETIRIZINE HCL 10 MG/ML IV SOLN
10.0000 mg | Freq: Once | INTRAVENOUS | Status: AC
  Administered 2023-10-03: 10 mg via INTRAVENOUS
  Filled 2023-10-03: qty 1

## 2023-10-03 MED ORDER — SODIUM CHLORIDE 0.9 % IV SOLN
174.2000 mg | Freq: Once | INTRAVENOUS | Status: AC
  Administered 2023-10-03: 170 mg via INTRAVENOUS
  Filled 2023-10-03: qty 17

## 2023-10-03 MED ORDER — SODIUM CHLORIDE 0.9 % IV SOLN: INTRAVENOUS | Status: DC

## 2023-10-03 MED ORDER — SODIUM CHLORIDE 0.9 % IV SOLN
45.0000 mg/m2 | Freq: Once | INTRAVENOUS | Status: AC
  Administered 2023-10-03: 90 mg via INTRAVENOUS
  Filled 2023-10-03: qty 15

## 2023-10-03 MED ORDER — PALONOSETRON HCL INJECTION 0.25 MG/5ML
0.2500 mg | Freq: Once | INTRAVENOUS | Status: AC
  Administered 2023-10-03: 0.25 mg via INTRAVENOUS
  Filled 2023-10-03: qty 5

## 2023-10-03 MED ORDER — SODIUM CHLORIDE 0.9 % IV SOLN
510.0000 mg | Freq: Once | INTRAVENOUS | Status: AC
  Administered 2023-10-03: 510 mg via INTRAVENOUS
  Filled 2023-10-03: qty 17

## 2023-10-03 MED ORDER — FAMOTIDINE IN NACL 20-0.9 MG/50ML-% IV SOLN
20.0000 mg | Freq: Once | INTRAVENOUS | Status: AC
  Administered 2023-10-03: 20 mg via INTRAVENOUS
  Filled 2023-10-03: qty 50

## 2023-10-03 NOTE — Patient Instructions (Signed)
 [facility] - A DEPT OF [facility]  Discharge Instructions: Thank you for choosing [facility] to provide your oncology and hematology care.   If you have a lab appointment with the Cancer Center, please go directly to the Cancer Center and check in at the registration area.   Wear comfortable clothing and clothing appropriate for easy access to any Portacath or PICC line.   We strive to give you quality time with your provider. You may need to reschedule your appointment if you arrive late (15 or more minutes).  Arriving late affects you and other patients whose appointments are after yours.  Also, if you miss three or more appointments without notifying the office, you may be dismissed from the clinic at the provider's discretion.      For prescription refill requests, have your pharmacy contact our office and allow 72 hours for refills to be completed.    Today you received the following chemotherapy and/or immunotherapy agents: Paclitaxel  (Taxol ) & Carboplatin  (Paraplatin )    To help prevent nausea and vomiting after your treatment, we encourage you to take your nausea medication as directed.  BELOW ARE SYMPTOMS THAT SHOULD BE REPORTED IMMEDIATELY: *FEVER GREATER THAN 100.4 F (38 C) OR HIGHER *CHILLS OR SWEATING *NAUSEA AND VOMITING THAT IS NOT CONTROLLED WITH YOUR NAUSEA MEDICATION *UNUSUAL SHORTNESS OF BREATH *UNUSUAL BRUISING OR BLEEDING *URINARY PROBLEMS (pain or burning when urinating, or frequent urination) *BOWEL PROBLEMS (unusual diarrhea, constipation, pain near the anus) TENDERNESS IN MOUTH AND THROAT WITH OR WITHOUT PRESENCE OF ULCERS (sore throat, sores in mouth, or a toothache) UNUSUAL RASH, SWELLING OR PAIN  UNUSUAL VAGINAL DISCHARGE OR ITCHING   Items with * indicate a potential emergency and should be followed up as soon as possible or go to the Emergency Department if any problems should occur.  Please show the  CHEMOTHERAPY ALERT CARD or IMMUNOTHERAPY ALERT CARD at check-in to the Emergency Department and triage nurse.  Should you have questions after your visit or need to cancel or reschedule your appointment, please contact [facility] - [facility]  Dept: [phone]  and follow the prompts.  Office hours are 8:00 a.m. to 4:30 p.m. Monday - Friday. Please note that voicemails left after 4:00 p.m. may not be returned until the following business day.  We are closed weekends and major holidays. You have access to a nurse at all times for urgent questions. Please call the main number to the clinic Dept: [phone] and follow the prompts.   For any non-urgent questions, you may also contact your provider using MyChart. We now offer e-Visits for anyone 95 and older to request care online for non-urgent symptoms. For details visit [email or web address].   Also download the MyChart app! Go to the app store, search MyChart, open the app, select [place], and log in with your MyChart username and password.  Ferumoxytol  Injection What is this medication? FERUMOXYTOL  (FER ue MOX i tol) treats low levels of iron in your body (iron deficiency anemia). Iron is a mineral that plays an important role in making red blood cells, which carry oxygen from your lungs to the rest of your body. This medicine may be used for other purposes; ask your health care provider or pharmacist if you have questions. COMMON BRAND NAME(S): Feraheme What should I tell my care team before I take this medication? They need to know if you  have any of these conditions: Anemia not caused by low iron levels High levels of iron in the blood Magnetic resonance imaging (MRI) test scheduled An unusual or allergic reaction to iron, other medications, foods, dyes, or preservatives Pregnant or trying to get pregnant Breastfeeding How should I use this medication? This medication is injected into  a vein. It is given by your care team in a hospital or clinic setting. Talk to your care team the use of this medication in children. Special care may be needed. Overdosage: If you think you have taken too much of this medicine contact a poison control center or emergency room at once. NOTE: This medicine is only for you. Do not share this medicine with others. What if I miss a dose? It is important not to miss your dose. Call your care team if you are unable to keep an appointment. What may interact with this medication? Other iron products This list may not describe all possible interactions. Give your health care provider a list of all the medicines, herbs, non-prescription drugs, or dietary supplements you use. Also tell them if you smoke, drink alcohol , or use illegal drugs. Some items may interact with your medicine. What should I watch for while using this medication? Visit your care team for regular checks on your progress. Tell your care team if your symptoms do not start to get better or if they get worse. You may need blood work done while you are taking this medication. You may need to eat more foods that contain iron. Talk to your care team. Foods that contain iron include whole grains or cereals, dried fruits, beans, peas, leafy green vegetables, and organ meats (liver, kidney). What side effects may I notice from receiving this medication? Side effects that you should report to your care team as soon as possible: Allergic reactions--skin rash, itching, hives, swelling of the face, lips, tongue, or throat Low blood pressure--dizziness, feeling faint or lightheaded, blurry vision Shortness of breath Side effects that usually do not require medical attention (report to your care team if they continue or are bothersome): Flushing Headache Joint pain Muscle pain Nausea Pain, redness, or irritation at injection site This list may not describe all possible side effects. Call your  doctor for medical advice about side effects. You may report side effects to FDA at 1-800-FDA-1088. Where should I keep my medication? This medication is given in a hospital or clinic. It will not be stored at home. NOTE: This sheet is a summary. It may not cover all possible information. If you have questions about this medicine, talk to your doctor, pharmacist, or health care provider.  2024 Elsevier/Gold Standard (2022-08-24 00:00:00)

## 2023-10-04 ENCOUNTER — Inpatient Hospital Stay
Admission: RE | Admit: 2023-10-04 | Discharge: 2023-10-04 | Disposition: A | Discharge status: Home / Self Care | Payer: Self-pay | Source: Ambulatory Visit | Attending: Radiation Oncology

## 2023-10-04 ENCOUNTER — Ambulatory Visit

## 2023-10-04 ENCOUNTER — Other Ambulatory Visit: Payer: Self-pay

## 2023-10-04 DIAGNOSIS — C3412 Malignant neoplasm of upper lobe, left bronchus or lung: Secondary | ICD-10-CM | POA: Diagnosis not present

## 2023-10-04 LAB — RAD ONC ARIA SESSION SUMMARY
Course Elapsed Days: 28
Plan Fractions Treated to Date: 20
Plan Prescribed Dose Per Fraction: 2 Gy
Plan Total Fractions Prescribed: 33
Plan Total Prescribed Dose: 66 Gy
Reference Point Dosage Given to Date: 40 Gy
Reference Point Session Dosage Given: 2 Gy
Session Number: 20

## 2023-10-05 ENCOUNTER — Other Ambulatory Visit: Payer: Self-pay

## 2023-10-05 ENCOUNTER — Inpatient Hospital Stay
Admission: RE | Admit: 2023-10-05 | Discharge: 2023-10-05 | Disposition: A | Discharge status: Home / Self Care | Payer: Self-pay | Source: Ambulatory Visit | Attending: Radiation Oncology

## 2023-10-05 DIAGNOSIS — C3412 Malignant neoplasm of upper lobe, left bronchus or lung: Secondary | ICD-10-CM | POA: Diagnosis not present

## 2023-10-05 LAB — RAD ONC ARIA SESSION SUMMARY
Course Elapsed Days: 29
Plan Fractions Treated to Date: 21
Plan Prescribed Dose Per Fraction: 2 Gy
Plan Total Fractions Prescribed: 33
Plan Total Prescribed Dose: 66 Gy
Reference Point Dosage Given to Date: 42 Gy
Reference Point Session Dosage Given: 2 Gy
Session Number: 21

## 2023-10-06 ENCOUNTER — Inpatient Hospital Stay
Admission: RE | Admit: 2023-10-06 | Discharge: 2023-10-06 | Disposition: A | Discharge status: Home / Self Care | Payer: Self-pay | Source: Ambulatory Visit | Attending: Radiation Oncology | Admitting: Radiation Oncology

## 2023-10-06 ENCOUNTER — Other Ambulatory Visit: Payer: Self-pay

## 2023-10-06 DIAGNOSIS — C3412 Malignant neoplasm of upper lobe, left bronchus or lung: Secondary | ICD-10-CM | POA: Diagnosis not present

## 2023-10-06 LAB — RAD ONC ARIA SESSION SUMMARY
Course Elapsed Days: 30
Plan Fractions Treated to Date: 22
Plan Prescribed Dose Per Fraction: 2 Gy
Plan Total Fractions Prescribed: 33
Plan Total Prescribed Dose: 66 Gy
Reference Point Dosage Given to Date: 44 Gy
Reference Point Session Dosage Given: 2 Gy
Session Number: 22

## 2023-10-09 ENCOUNTER — Inpatient Hospital Stay

## 2023-10-09 ENCOUNTER — Inpatient Hospital Stay (HOSPITAL_BASED_OUTPATIENT_CLINIC_OR_DEPARTMENT_OTHER): Admitting: Internal Medicine

## 2023-10-09 ENCOUNTER — Encounter: Payer: Self-pay | Admitting: Internal Medicine

## 2023-10-09 ENCOUNTER — Other Ambulatory Visit: Payer: Self-pay

## 2023-10-09 ENCOUNTER — Ambulatory Visit
Admission: RE | Admit: 2023-10-09 | Discharge: 2023-10-09 | Disposition: A | Discharge status: Home / Self Care | Source: Ambulatory Visit | Attending: Radiation Oncology

## 2023-10-09 ENCOUNTER — Ambulatory Visit
Admission: RE | Admit: 2023-10-09 | Discharge: 2023-10-09 | Disposition: A | Discharge status: Home / Self Care | Payer: Self-pay | Source: Ambulatory Visit | Attending: Radiation Oncology

## 2023-10-09 VITALS — BP 115/70 | HR 87 | Temp 98.1°F | Resp 18 | Wt 198.1 lb

## 2023-10-09 DIAGNOSIS — C3412 Malignant neoplasm of upper lobe, left bronchus or lung: Secondary | ICD-10-CM

## 2023-10-09 DIAGNOSIS — D649 Anemia, unspecified: Secondary | ICD-10-CM

## 2023-10-09 LAB — CBC WITH DIFFERENTIAL (CANCER CENTER ONLY)
Abs Immature Granulocytes: 0.01 K/uL (ref 0.00–0.07)
Basophils Absolute: 0 K/uL (ref 0.0–0.1)
Basophils Relative: 1 %
Eosinophils Absolute: 0 K/uL (ref 0.0–0.5)
Eosinophils Relative: 0 %
HCT: 27.9 % — ABNORMAL LOW (ref 39.0–52.0)
Hemoglobin: 9.4 g/dL — ABNORMAL LOW (ref 13.0–17.0)
Immature Granulocytes: 0 %
Lymphocytes Relative: 12 %
Lymphs Abs: 0.4 K/uL — ABNORMAL LOW (ref 0.7–4.0)
MCH: 30.9 pg (ref 26.0–34.0)
MCHC: 33.7 g/dL (ref 30.0–36.0)
MCV: 91.8 fL (ref 80.0–100.0)
Monocytes Absolute: 0.3 K/uL (ref 0.1–1.0)
Monocytes Relative: 11 %
Neutro Abs: 2.3 K/uL (ref 1.7–7.7)
Neutrophils Relative %: 76 %
Platelet Count: 234 K/uL (ref 150–400)
RBC: 3.04 MIL/uL — ABNORMAL LOW (ref 4.22–5.81)
RDW: 17.5 % — ABNORMAL HIGH (ref 11.5–15.5)
WBC Count: 3.1 K/uL — ABNORMAL LOW (ref 4.0–10.5)
nRBC: 0 % (ref 0.0–0.2)

## 2023-10-09 LAB — CMP (CANCER CENTER ONLY)
ALT: 12 U/L (ref 0–44)
AST: 17 U/L (ref 15–41)
Albumin: 4.1 g/dL (ref 3.5–5.0)
Alkaline Phosphatase: 55 U/L (ref 38–126)
Anion gap: 5 (ref 5–15)
BUN: 14 mg/dL (ref 8–23)
CO2: 25 mmol/L (ref 22–32)
Calcium: 8.6 mg/dL — ABNORMAL LOW (ref 8.9–10.3)
Chloride: 108 mmol/L (ref 98–111)
Creatinine: 1.39 mg/dL — ABNORMAL HIGH (ref 0.61–1.24)
GFR, Estimated: 54 mL/min — ABNORMAL LOW (ref >60)
Glucose, Bld: 104 mg/dL — ABNORMAL HIGH (ref 70–99)
Potassium: 3.4 mmol/L — ABNORMAL LOW (ref 3.5–5.1)
Sodium: 138 mmol/L (ref 135–145)
Total Bilirubin: 0.3 mg/dL (ref 0.0–1.2)
Total Protein: 7.1 g/dL (ref 6.5–8.1)

## 2023-10-09 LAB — RAD ONC ARIA SESSION SUMMARY
Course Elapsed Days: 33
Plan Fractions Treated to Date: 23
Plan Prescribed Dose Per Fraction: 2 Gy
Plan Total Fractions Prescribed: 33
Plan Total Prescribed Dose: 66 Gy
Reference Point Dosage Given to Date: 46 Gy
Reference Point Session Dosage Given: 2 Gy
Session Number: 23

## 2023-10-09 LAB — SAMPLE TO BLOOD BANK

## 2023-10-09 MED ORDER — PALONOSETRON HCL INJECTION 0.25 MG/5ML
0.2500 mg | Freq: Once | INTRAVENOUS | Status: AC
  Administered 2023-10-09: 0.25 mg via INTRAVENOUS
  Filled 2023-10-09: qty 5

## 2023-10-09 MED ORDER — SODIUM CHLORIDE 0.9 % IV SOLN
45.0000 mg/m2 | Freq: Once | INTRAVENOUS | Status: AC
  Administered 2023-10-09: 90 mg via INTRAVENOUS
  Filled 2023-10-09: qty 15

## 2023-10-09 MED ORDER — DEXAMETHASONE SODIUM PHOSPHATE 10 MG/ML IJ SOLN
10.0000 mg | Freq: Once | INTRAMUSCULAR | Status: AC
  Administered 2023-10-09: 10 mg via INTRAVENOUS
  Filled 2023-10-09: qty 1

## 2023-10-09 MED ORDER — SODIUM CHLORIDE 0.9 % IV SOLN
170.6000 mg | Freq: Once | INTRAVENOUS | Status: AC
  Administered 2023-10-09: 170 mg via INTRAVENOUS
  Filled 2023-10-09: qty 17

## 2023-10-09 MED ORDER — FAMOTIDINE IN NACL 20-0.9 MG/50ML-% IV SOLN
20.0000 mg | Freq: Once | INTRAVENOUS | Status: AC
  Administered 2023-10-09: 20 mg via INTRAVENOUS
  Filled 2023-10-09: qty 50

## 2023-10-09 MED ORDER — SODIUM CHLORIDE 0.9 % IV SOLN: INTRAVENOUS | Status: DC

## 2023-10-09 MED ORDER — CETIRIZINE HCL 10 MG/ML IV SOLN
10.0000 mg | Freq: Once | INTRAVENOUS | Status: AC
  Administered 2023-10-09: 10 mg via INTRAVENOUS
  Filled 2023-10-09: qty 1

## 2023-10-09 NOTE — Patient Instructions (Addendum)
 Recipe for mouth rinse: 1 quart of water  (4 cups) mixed with 3/4 teaspoon of salt and 1 teaspoon of baking soda.  Rinse and spit 3-4 times a day as needed.  [facility] - A DEPT OF [facility]  Discharge Instructions: Thank you for choosing [facility] to provide your oncology and hematology care.   If you have a lab appointment with the Cancer Center, please go directly to the Cancer Center and check in at the registration area.   Wear comfortable clothing and clothing appropriate for easy access to any Portacath or PICC line.   We strive to give you quality time with your provider. You may need to reschedule your appointment if you arrive late (15 or more minutes).  Arriving late affects you and other patients whose appointments are after yours.  Also, if you miss three or more appointments without notifying the office, you may be dismissed from the clinic at the provider's discretion.      For prescription refill requests, have your pharmacy contact our office and allow 72 hours for refills to be completed.    Today you received the following chemotherapy and/or immunotherapy agents: Paclitaxel , Carboplatin       To help prevent nausea and vomiting after your treatment, we encourage you to take your nausea medication as directed.  BELOW ARE SYMPTOMS THAT SHOULD BE REPORTED IMMEDIATELY: *FEVER GREATER THAN 100.4 F (38 C) OR HIGHER *CHILLS OR SWEATING *NAUSEA AND VOMITING THAT IS NOT CONTROLLED WITH YOUR NAUSEA MEDICATION *UNUSUAL SHORTNESS OF BREATH *UNUSUAL BRUISING OR BLEEDING *URINARY PROBLEMS (pain or burning when urinating, or frequent urination) *BOWEL PROBLEMS (unusual diarrhea, constipation, pain near the anus) TENDERNESS IN MOUTH AND THROAT WITH OR WITHOUT PRESENCE OF ULCERS (sore throat, sores in mouth, or a toothache) UNUSUAL RASH, SWELLING OR PAIN  UNUSUAL VAGINAL DISCHARGE OR ITCHING   Items with * indicate a potential  emergency and should be followed up as soon as possible or go to the Emergency Department if any problems should occur.  Please show the CHEMOTHERAPY ALERT CARD or IMMUNOTHERAPY ALERT CARD at check-in to the Emergency Department and triage nurse.  Should you have questions after your visit or need to cancel or reschedule your appointment, please contact [facility]  Dept: [phone]  and follow the prompts.  Office hours are 8:00 a.m. to 4:30 p.m. Monday - Friday. Please note that voicemails left after 4:00 p.m. may not be returned until the following business day.  We are closed weekends and major holidays. You have access to a nurse at all times for urgent questions. Please call the main number to the clinic Dept: [phone] and follow the prompts.   For any non-urgent questions, you may also contact your provider using MyChart. We now offer e-Visits for anyone 52 and older to request care online for non-urgent symptoms. For details visit [email or web address].   Also download the MyChart app! Go to the app store, search MyChart, open the app, select [place], and log in with your MyChart username and password.  Hypocalcemia, Adult Hypocalcemia is when the level of calcium  in a person's blood is below normal. Calcium  is a mineral that is used by the body in many ways. Not having enough blood calcium  can affect the nervous system. This can lead to problems with the muscles, the heart, and the brain. What are the causes? This condition  may be caused by: A deficiency of vitamin D  or magnesium  or both. Decreased levels of parathyroid hormone (hypoparathyroidism). Kidney function problems. Low levels of a body protein called albumin. Inflammation of the pancreas (pancreatitis). Not taking in enough vitamins and minerals in the diet or having intestinal problems that interfere with absorption of nutrients. Certain medicines. What  are the signs or symptoms? Symptoms of this condition may include: Numbness and tingling in the fingers, toes, or around the mouth. Muscle twitching, aches, or cramps, especially in the legs, feet, and back. Spasm of the voice box. This may make it difficult to breathe or speak. Fast heartbeats (palpitations) and abnormal heart rhythms (dysrhythmias). Shaking uncontrollably (seizures). Memory problems, confusion, or difficulty thinking. Depression, anxiety, irritability, or changes in personality. Long-term (chronic) symptoms of this condition may include: Coarse, brittle hair and nails. Dry skin or lasting skin diseases. These include psoriasis, eczema, or dermatitis. Dental cavities. Clouding of the eye lens (cataracts). Some people may not have any symptoms, especially if they have chronic hypocalcemia. How is this diagnosed?  This condition is usually diagnosed with a blood test. You may also have other tests to help determine the underlying cause of the condition. This may include more blood tests and imaging tests. How is this treated? This condition may be treated with: Calcium  given by mouth or through an IV. The method used for giving calcium  will depend on the severity of the condition. If your condition is severe, you may need to be closely monitored in the hospital. Giving other minerals (electrolytes), such as magnesium . Other treatment will depend on the cause of the condition. Follow these instructions at home: Follow diet instructions from your health care provider or dietitian. Take supplements only as told by your health care provider. Do not take an iron supplement within 2 hours of taking a calcium  supplement. Keep all follow-up visits. This is important. Contact a health care provider if: You have increased muscle twitching or cramps. You have new swelling in the feet, ankles, or legs. You develop changes in mood, memory, or personality. Get help right away  if: You have chest pain. You have persistent rapid or irregular heartbeats. You have trouble breathing. You faint. You start to have seizures. You have confusion. These symptoms may represent a serious problem that is an emergency. Do not wait to see if the symptoms will go away. Get medical help right away. Call your local emergency services (911 in the [place] Do not drive yourself to the hospital. Summary Hypocalcemia is when the level of calcium  in a person's blood is below normal. Not having enough blood calcium  can affect the nervous system. This condition may be treated with calcium  given by mouth or through an IV, or by receiving other minerals. Other treatment depends on the cause of the condition. Take supplements only as told by your health care provider. Contact a health care provider if you have new or worsening symptoms. Keep all follow-up visits. This is important. This information is not intended to replace advice given to you by your health care provider. Make sure you discuss any questions you have with your health care provider. Document Revised: 06/10/2020 Document Reviewed: 06/10/2020 Elsevier Patient Education  2024 Elsevier Inc.  Hypokalemia Hypokalemia means that the amount of potassium in the blood is lower than normal. Potassium is a mineral (electrolyte) that helps regulate the amount of fluid in the body. It also stimulates muscle tightening (contraction) and helps nerves work properly. Normally, most of the  body's potassium is inside cells, and only a very small amount is in the blood. Because the amount in the blood is so small, minor changes to potassium levels in the blood can be life-threatening. What are the causes? This condition may be caused by: Antibiotic medicine. Diarrhea or vomiting. Taking too much of a medicine that helps you have a bowel movement (laxative) can cause diarrhea and lead to hypokalemia. Chronic kidney disease (CKD). Medicines that  help the body get rid of excess fluid (diuretics). Eating disorders, such as anorexia or bulimia. Low magnesium  levels in the body. Sweating a lot. What are the signs or symptoms? Symptoms of this condition include: Weakness. Constipation. Fatigue. Muscle cramps. Mental confusion. Skipped heartbeats or irregular heartbeat (palpitations). Tingling or numbness. How is this diagnosed? This condition is diagnosed with a blood test. How is this treated? This condition may be treated by: Taking potassium supplements. Adjusting the medicines that you take. Eating more foods that contain a lot of potassium. If your potassium level is very low, you may need to get potassium through an IV and be monitored in the hospital. Follow these instructions at home: Eating and drinking  Eat a healthy diet. A healthy diet includes fresh fruits and vegetables, whole grains, healthy fats, and lean proteins. If told, eat more foods that contain a lot of potassium. These include: Nuts, such as peanuts and pistachios. Seeds, such as sunflower seeds and pumpkin seeds. Peas, lentils, and lima beans. Whole grain and bran cereals and breads. Fresh fruits and vegetables, such as apricots, avocado, bananas, cantaloupe, kiwi, oranges, tomatoes, asparagus, and potatoes. Juices, such as orange, tomato, and prune. Lean meats, including fish. Milk and milk products, such as yogurt. General instructions Take over-the-counter and prescription medicines only as told by your health care provider. This includes vitamins, natural food products, and supplements. Keep all follow-up visits. This is important. Contact a health care provider if: You have weakness that gets worse. You feel your heart pounding or racing. You vomit. You have diarrhea. You have diabetes and you have trouble keeping your blood sugar in your target range. Get help right away if: You have chest pain. You have shortness of breath. You have  vomiting or diarrhea that lasts for more than 2 days. You faint. These symptoms may be an emergency. Get help right away. Call 911. Do not wait to see if the symptoms will go away. Do not drive yourself to the hospital. Summary Hypokalemia means that the amount of potassium in the blood is lower than normal. This condition is diagnosed with a blood test. Hypokalemia may be treated by taking potassium supplements, adjusting the medicines that you take, or eating more foods that are high in potassium. If your potassium level is very low, you may need to get potassium through an IV and be monitored in the hospital. This information is not intended to replace advice given to you by your health care provider. Make sure you discuss any questions you have with your health care provider. Document Revised: 09/17/2020 Document Reviewed: 09/17/2020 Elsevier Patient Education  2024 [employer].

## 2023-10-09 NOTE — Progress Notes (Signed)
 [facility] Telephone:([phone]) [phone]   Fax:([phone]) [phone]  OFFICE PROGRESS NOTE  [name], MD [address] [place] [address]  DIAGNOSIS:  Stage IIIA (T3, N1, M0) non-small cell lung cancer, adenocarcinoma presented with large left upper lobe lung mass in addition to left hilar lymphadenopathy diagnosed in August 2025.    Molecular Studies:  Biomarker Findings Tumor Mutational Burden - 13 Muts/Mb HRD signature - HRDsig Negative Microsatellite status - MS-Equivocal ? Genomic Findings For a complete list of the genes assayed, please refer to the Appendix. MET exon 14 splice site (3028+1G>[id number]), amplification CDK4 amplification MDM2 amplification PIK3CA [id number], [id number]   PDL1: 99%   PRIOR THERAPY: None   CURRENT THERAPY:  1) Concurrent chemoradiation with carboplatin  for an AUC of 2 and taxol  45 mg/m2. First dose on 09/19/23. Status post 3 cycles.  2) Fereheme 510 mg weekly x2. First dose on 09/26/23  INTERVAL HISTORY: [name] 73 y.o. male returns to the clinic today for follow-up visit and his daughter was available by phone during the visit. Discussed the use of AI scribe software for clinical note transcription with the patient, who gave verbal consent to proceed.  History of Present Illness [name] is a 73 year old male with stage 3A non-small cell lung cancer who presents for evaluation before starting cycle number four of concurrent chemoradiation. He is accompanied by his daughter.  He was diagnosed with stage 3A non-small cell lung cancer, adenocarcinoma with a positive MET exon 14 splice mutation and PD-L1 expression of ninety-nine percent in August 2025. He is currently undergoing concurrent chemoradiation with weekly carboplatin  and paclitaxel . He has completed three cycles and is here for evaluation before starting cycle number four.  He experiences significant fatigue, particularly on the second day after chemotherapy. He also has  increased coughing, which has been more pronounced over the last two days. He has hycodan at home but prefers using Delsym  for cough management.  He experienced nausea last Sunday into Monday but did not take his prescribed anti-nausea medication at home. He receives nausea medication with his infusion, which helps manage symptoms during treatment.  He has a history of IBS-D and reports an increase in diarrhea, occurring two to three times a day, which led to dehydration and required fluid administration recently. He takes probiotics daily and uses Imodium as needed for diarrhea management. He also notes decreased urine output, which is likely related to dehydration from diarrhea. His potassium was noted to be slightly low, at 3.4.  He notes nail splitting, which may be related to iron deficiency, but it is not severe. His hemoglobin has improved to 9.1 following iron infusions, up from a previous level of 8.     MEDICAL HISTORY: Past Medical History:  Diagnosis Date   (HFpEF) heart failure with preserved ejection fraction (HCC)    Allergy    Anxiety    Arthritis    Chronic kidney disease    Depression    Frequent urination    GERD (gastroesophageal reflux disease)    Gout    Headache    mirgaines in the past   Herpes    History of nuclear stress test    Myoview  05/2021: EF 77, normal perfusion, low risk   Hyperlipidemia    Hypertension    IBS (irritable bowel syndrome)    Memory loss    MGUS (monoclonal gammopathy of unknown significance)    Obesity    Pre-diabetes  Stroke [facility])    Urgency of urination     ALLERGIES:  is allergic to divalproex sodium, lamotrigine, valproic acid, diphenhydramine , amitriptyline, aspirin , hydrocodone , lisinopril, lithium, morphine , nsaids, oxycodone, oxycodone-acetaminophen , and pork allergy.  MEDICATIONS:  Current Outpatient Medications  Medication Sig Dispense Refill   acetaminophen  (TYLENOL ) 500 MG tablet Take 1-2 tablets (500-1,000 mg  total) by mouth every 6 (six) hours as needed for moderate pain. 30 tablet 0   allopurinol  (ZYLOPRIM ) 100 MG tablet Take 150 mg by mouth daily.     amLODipine  (NORVASC ) 10 MG tablet Take 1 tablet (10 mg total) by mouth daily. 90 tablet 3   benzonatate  (TESSALON ) 200 MG capsule Take 1 capsule (200 mg total) by mouth in the morning and at bedtime. 60 capsule 2   busPIRone  (BUSPAR ) 30 MG tablet Take 30 mg by mouth 2 (two) times daily.     cetirizine  (ZYRTEC  ALLERGY) 10 MG tablet Take 1 tablet (10 mg total) by mouth daily. 30 tablet 0   clindamycin (CLEOCIN) 300 MG capsule Take 300 mg by mouth 3 (three) times daily.     [Paused] clopidogrel  (PLAVIX ) 75 MG tablet Take 1 tablet (75 mg total) by mouth daily. Okay to restart this medication on 08/22/2023.     dextromethorphan  (DELSYM ) 30 MG/5ML liquid Take 5 mLs (30 mg total) by mouth 2 (two) times daily as needed for cough. 89 mL 0   ferrous sulfate  325 (65 FE) MG EC tablet Take 1 tablet (325 mg total) by mouth daily with breakfast. 30 tablet 3   fluticasone  (FLONASE ) 50 MCG/ACT nasal spray Place 1 spray into both nostrils daily. 16 g 2   folic acid  (FOLVITE ) 1 MG tablet Take 1 mg by mouth daily.     furosemide  (LASIX ) 40 MG tablet Take 1 tablet (40 mg total) by mouth daily. 90 tablet 3   [Paused] hydrALAZINE  (APRESOLINE ) 50 MG tablet Take 1.5 tablets (75 mg total) by mouth 2 (two) times daily. 270 tablet 3   HYDROcodone  bit-homatropine (HYCODAN) 5-1.5 MG/5ML syrup Take 5 mLs by mouth every 4 (four) hours as needed (Persistent cough). 120 mL 0   Hydroxychloroquine  Sulfate 400 MG TABS Take 400 mg by mouth at bedtime.     Lactobacillus (ACIDOPHILUS) CAPS capsule Take 1 capsule by mouth daily. 100 capsule 0   lidocaine  (LIDODERM ) 5 % Place 1 patch onto the skin daily as needed (for pain- Remove & Discard patch within 12 hours or as directed by MD).     lidocaine -prilocaine  (EMLA ) cream Apply to affected area once 30 g 3   Multiple Vitamins-Minerals  (MULTIVITAMIN) tablet Take 1 tablet by mouth daily. 30 tablet 1   ondansetron  (ZOFRAN ) 8 MG tablet Take 1 tablet (8 mg total) by mouth every 8 (eight) hours as needed for nausea or vomiting. Start on the third day after chemotherapy. 30 tablet 1   PARoxetine  (PAXIL -CR) 37.5 MG 24 hr tablet Take 1 tablet (37.5 mg total) by mouth at bedtime.     primidone  (MYSOLINE ) 250 MG tablet Take 1 tablet (250 mg total) by mouth 2 (two) times daily. 60 tablet 2   prochlorperazine  (COMPAZINE ) 10 MG tablet Take 1 tablet (10 mg total) by mouth every 6 (six) hours as needed for nausea or vomiting. 30 tablet 1   QUEtiapine  (SEROQUEL ) 25 MG tablet Take 25 mg by mouth at bedtime.     rosuvastatin  (CRESTOR ) 40 MG tablet Take 40 mg by mouth daily.     traZODone  (DESYREL ) 100 MG tablet Take  100 mg by mouth at bedtime.     valACYclovir  (VALTREX ) 1000 MG tablet Take 1,000 mg by mouth daily.     Vitamin D , Ergocalciferol , (DRISDOL ) 1.25 MG (50000 UNIT) CAPS capsule Take 50,000 Units by mouth every Saturday.     No current facility-administered medications for this visit.    SURGICAL HISTORY:  Past Surgical History:  Procedure Laterality Date   CHOLECYSTECTOMY N/A 12/17/2019   Procedure: LAPAROSCOPIC CHOLECYSTECTOMY WITH ICG INJECTION;  Surgeon: Ebbie Cough, MD;  Location: [facility] OR;  Service: General;  Laterality: N/A;   disckec     IR IMAGING GUIDED PORT INSERTION  09/12/2023   shouler arthroscopy Left    SPINAL FUSION     TONSILLECTOMY     TOTAL KNEE ARTHROPLASTY Right 11/15/2021   Procedure: TOTAL KNEE ARTHROPLASTY;  Surgeon: Melodi Lerner, MD;  Location: [facility] ORS;  Service: Orthopedics;  Laterality: Right;   VIDEO BRONCHOSCOPY WITH ENDOBRONCHIAL NAVIGATION Left 08/21/2023   Procedure: VIDEO BRONCHOSCOPY WITH ENDOBRONCHIAL NAVIGATION;  Surgeon: Shelah Lamar RAMAN, MD;  Location: [facility] ENDOSCOPY;  Service: Pulmonary;  Laterality: Left;   VIDEO BRONCHOSCOPY WITH ENDOBRONCHIAL ULTRASOUND  08/21/2023   Procedure:  BRONCHOSCOPY, WITH EBUS;  Surgeon: Shelah Lamar RAMAN, MD;  Location: [facility] ENDOSCOPY;  Service: Pulmonary;;   VIDEO BRONCHOSCOPY WITH RADIAL ENDOBRONCHIAL ULTRASOUND  08/21/2023   Procedure: VIDEO BRONCHOSCOPY WITH RADIAL ENDOBRONCHIAL ULTRASOUND;  Surgeon: Shelah Lamar RAMAN, MD;  Location: [facility] ENDOSCOPY;  Service: Pulmonary;;   WISDOM TOOTH EXTRACTION      REVIEW OF SYSTEMS:  Constitutional: positive for fatigue Eyes: negative Ears, nose, mouth, throat, and face: negative Respiratory: positive for cough and dyspnea on exertion Cardiovascular: negative Gastrointestinal: positive for diarrhea and nausea Genitourinary:negative Integument/breast: negative Hematologic/lymphatic: negative Musculoskeletal:positive for arthralgias and muscle weakness Neurological: negative Behavioral/Psych: negative Endocrine: negative Allergic/Immunologic: negative   PHYSICAL EXAMINATION: General appearance: alert, cooperative, fatigued, and no distress Head: Normocephalic, without obvious abnormality, atraumatic Neck: no adenopathy, no JVD, supple, symmetrical, trachea midline, and thyroid  not enlarged, symmetric, no tenderness/mass/nodules Lymph nodes: Cervical, supraclavicular, and axillary nodes normal. Resp: clear to auscultation bilaterally Back: symmetric, no curvature. ROM normal. No CVA tenderness. Cardio: regular rate and rhythm, S1, S2 normal, no murmur, click, rub or gallop GI: soft, non-tender; bowel sounds normal; no masses,  no organomegaly Extremities: extremities normal, atraumatic, no cyanosis or edema Neurologic: Alert and oriented X 3, normal strength and tone. Normal symmetric reflexes. Normal coordination and gait  ECOG PERFORMANCE STATUS: 1 - Symptomatic but completely ambulatory  Blood pressure (!) 132/22.  LABORATORY DATA: Lab Results  Component Value Date   WBC 3.1 (L) 10/09/2023   HGB 9.4 (L) 10/09/2023   HCT 27.9 (L) 10/09/2023   MCV 91.8 10/09/2023   PLT 234 10/09/2023       Chemistry      Component Value Date/Time   NA 138 10/03/2023 1257   NA 140 07/27/2023 1454   K 3.5 10/03/2023 1257   CL 105 10/03/2023 1257   CO2 28 10/03/2023 1257   BUN 13 10/03/2023 1257   BUN 15 07/27/2023 1454   CREATININE 1.35 (H) 10/03/2023 1257      Component Value Date/Time   CALCIUM  8.8 (L) 10/03/2023 1257   ALKPHOS 59 10/03/2023 1257   AST 18 10/03/2023 1257   ALT 11 10/03/2023 1257   BILITOT 0.3 10/03/2023 1257       RADIOGRAPHIC STUDIES: IR IMAGING GUIDED PORT INSERTION Result Date: 09/12/2023 INDICATION: Port-A-Cath needed for treatment of lung cancer. EXAM: FLUOROSCOPIC AND ULTRASOUND GUIDED PLACEMENT  OF A SUBCUTANEOUS PORT MEDICATIONS: Benadryl  25 mg ANESTHESIA/SEDATION: Moderate (conscious) sedation was employed during this procedure. A total of Versed  4 mg and fentanyl  100 mcg was administered intravenously at the order of the provider performing the procedure. Total intra-service moderate sedation time: 30 minutes. Patient's level of consciousness and vital signs were monitored continuously by radiology nurse throughout the procedure under the supervision of the provider performing the procedure. FLUOROSCOPY TIME:  Radiation Exposure Index (as provided by the fluoroscopic device): 7 mGy Kerma COMPLICATIONS: None immediate. PROCEDURE: The procedure, risks, benefits, and alternatives were explained to the patient. Questions regarding the procedure were encouraged and answered. The patient understands and consents to the procedure. Patient was placed supine on the interventional table. Ultrasound confirmed a patent right internal jugular vein. Ultrasound image was saved for documentation. The right chest and neck were cleaned with a skin antiseptic and a sterile drape was placed. Maximal barrier sterile technique was utilized including caps, mask, sterile gowns, sterile gloves, sterile drape, hand hygiene and skin antiseptic. The right neck was anesthetized with 1% lidocaine .  Small incision was made in the right neck with a blade. Micropuncture set was placed in the right internal jugular vein with ultrasound guidance. The micropuncture wire was used for measurement purposes. The right chest was anesthetized with 1% lidocaine  with epinephrine . #15 blade was used to make an incision and a subcutaneous port pocket was formed. 8 french Power Port was assembled. Subcutaneous tunnel was formed with a stiff tunneling device. The port catheter was brought through the subcutaneous tunnel. The port was placed in the subcutaneous pocket. The micropuncture set was exchanged for a peel-away sheath. The catheter was placed through the peel-away sheath and the tip was positioned at the superior cavoatrial junction. Catheter placement was confirmed with fluoroscopy. The port was accessed and flushed with saline. The port pocket was closed using two layers of absorbable sutures and Dermabond. The vein skin site was closed using a single layer of absorbable suture and Dermabond. Sterile dressings were applied. Patient tolerated the procedure well without an immediate complication. Ultrasound and fluoroscopic images were taken and saved for this procedure. IMPRESSION: Placement of a subcutaneous power-injectable port device. Catheter tip at the superior cavoatrial junction. Electronically Signed   By: Juliene Balder M.D.   On: 09/12/2023 17:02    ASSESSMENT AND PLAN: This is a 73 years old African-American male with Stage IIIA (T3, N1, M0) non-small cell lung cancer, adenocarcinoma presented with large left upper lobe lung mass in addition to left hilar lymphadenopathy diagnosed in August 2025.  Molecular study showed positive met exon 14 splice mutation and PD-L1 expression of 99%. He is currently undergoing a course of concurrent chemoradiation with weekly carboplatin  for AUC of 2 and paclitaxel  45 mg/M2.  Status post 3 cycles and he is here today for evaluation before starting cycle #4.  Assessment  and Plan Assessment & Plan Stage 3A non-small cell lung cancer, adenocarcinoma with MET exon 14 mutation and high PD-L1 expression Currently undergoing concurrent chemoradiation with weekly carboplatin  and paclitaxel . Completed three cycles and preparing for the fourth cycle. Genetic testing confirmed MET exon 14 mutation and high PD-L1 expression, which are actionable mutations for future treatment options if disease progresses to stage 4. Discussed potential future use of targeted therapies and immunotherapy, considering his IBS-D and diarrhea. - Proceed with cycle number four of chemoradiation - Schedule follow-up in two weeks to evaluate treatment progress and consider scheduling a scan  Chemotherapy- and radiation-induced fatigue and  nausea Experiencing significant fatigue, particularly on the second day after chemotherapy, likely due to steroid withdrawal. Nausea occurred last Sunday into Monday but was not severe enough to require medication. He has anti-nausea medication available at home. - Advise taking anti-nausea medication at home if nausea recurs - Manage expectations regarding steroid withdrawal effects  Radiation-induced cough Increased cough over the last two days, attributed to radiation-induced inflammation in the area of the tumor. Prefers using Delsym  over hycodan for cough management. - Use Delsym  for cough management as preferred  Irritable bowel syndrome with diarrhea (IBS-D) with recent increase in diarrhea and dehydration Recent increase in diarrhea, possibly exacerbated by chemotherapy, leading to dehydration. Currently taking probiotics. Diarrhea occurs two to three times a day intermittently. - Use Imodium as needed for diarrhea - Monitor for signs of dehydration and provide fluids if necessary  Mild hypokalemia secondary to diarrhea Potassium level is mildly low at 3.4, likely due to diarrhea. Not severe but requires dietary management. - Increase dietary intake  of potassium-rich foods such as bananas and orange juice  Anemia, improving, likely secondary to chronic disease and/or chemotherapy Anemia is improving with current hemoglobin at 9.1, up from 8.0, likely due to previous iron infusions.  Nail splitting (onychorrhexis) Nail splitting observed, possibly related to iron deficiency. Not severe and does not appear to be causing significant issues. - Advise that manicure is permissible with caution to avoid injury The patient was advised to call immediately if he has any concerning symptoms in the interval. The patient voices understanding of current disease status and treatment options and is in agreement with the current care plan.  All questions were answered. The patient knows to call the clinic with any problems, questions or concerns. We can certainly see the patient much sooner if necessary. The total time spent in the appointment was 30 minutes including review of chart and various tests results, discussions about plan of care and coordination of care plan .   Disclaimer: This note was dictated with voice recognition software. Similar sounding words can inadvertently be transcribed and may not be corrected upon review.

## 2023-10-10 ENCOUNTER — Other Ambulatory Visit: Payer: Self-pay

## 2023-10-10 ENCOUNTER — Ambulatory Visit
Admission: RE | Admit: 2023-10-10 | Discharge: 2023-10-10 | Disposition: A | Discharge status: Home / Self Care | Payer: Self-pay | Source: Ambulatory Visit | Attending: Radiation Oncology | Admitting: Radiation Oncology

## 2023-10-10 DIAGNOSIS — C3412 Malignant neoplasm of upper lobe, left bronchus or lung: Secondary | ICD-10-CM | POA: Diagnosis not present

## 2023-10-10 LAB — RAD ONC ARIA SESSION SUMMARY
Course Elapsed Days: 34
Plan Fractions Treated to Date: 24
Plan Prescribed Dose Per Fraction: 2 Gy
Plan Total Fractions Prescribed: 33
Plan Total Prescribed Dose: 66 Gy
Reference Point Dosage Given to Date: 48 Gy
Reference Point Session Dosage Given: 2 Gy
Session Number: 24

## 2023-10-11 ENCOUNTER — Inpatient Hospital Stay
Admission: RE | Admit: 2023-10-11 | Discharge: 2023-10-11 | Disposition: A | Discharge status: Home / Self Care | Payer: Self-pay | Source: Ambulatory Visit | Attending: Radiation Oncology | Admitting: Radiation Oncology

## 2023-10-11 ENCOUNTER — Encounter

## 2023-10-11 ENCOUNTER — Other Ambulatory Visit: Payer: Self-pay

## 2023-10-11 DIAGNOSIS — C3412 Malignant neoplasm of upper lobe, left bronchus or lung: Secondary | ICD-10-CM | POA: Diagnosis not present

## 2023-10-11 LAB — RAD ONC ARIA SESSION SUMMARY
Course Elapsed Days: 35
Plan Fractions Treated to Date: 25
Plan Prescribed Dose Per Fraction: 2 Gy
Plan Total Fractions Prescribed: 33
Plan Total Prescribed Dose: 66 Gy
Reference Point Dosage Given to Date: 50 Gy
Reference Point Session Dosage Given: 2 Gy
Session Number: 25

## 2023-10-12 ENCOUNTER — Other Ambulatory Visit: Payer: Self-pay

## 2023-10-12 ENCOUNTER — Inpatient Hospital Stay

## 2023-10-12 ENCOUNTER — Ambulatory Visit
Admission: RE | Admit: 2023-10-12 | Discharge: 2023-10-12 | Disposition: A | Discharge status: Home / Self Care | Payer: Self-pay | Source: Ambulatory Visit | Attending: Radiation Oncology | Admitting: Radiation Oncology

## 2023-10-12 DIAGNOSIS — C3412 Malignant neoplasm of upper lobe, left bronchus or lung: Secondary | ICD-10-CM | POA: Diagnosis not present

## 2023-10-12 LAB — RAD ONC ARIA SESSION SUMMARY
Course Elapsed Days: 36
Plan Fractions Treated to Date: 26
Plan Prescribed Dose Per Fraction: 2 Gy
Plan Total Fractions Prescribed: 33
Plan Total Prescribed Dose: 66 Gy
Reference Point Dosage Given to Date: 52 Gy
Reference Point Session Dosage Given: 2 Gy
Session Number: 26

## 2023-10-13 ENCOUNTER — Ambulatory Visit
Admission: RE | Admit: 2023-10-13 | Discharge: 2023-10-13 | Disposition: A | Discharge status: Home / Self Care | Payer: Self-pay | Source: Ambulatory Visit | Attending: Radiation Oncology | Admitting: Radiation Oncology

## 2023-10-13 ENCOUNTER — Inpatient Hospital Stay

## 2023-10-13 ENCOUNTER — Other Ambulatory Visit: Payer: Self-pay

## 2023-10-13 DIAGNOSIS — C3412 Malignant neoplasm of upper lobe, left bronchus or lung: Secondary | ICD-10-CM | POA: Diagnosis not present

## 2023-10-13 LAB — RAD ONC ARIA SESSION SUMMARY
Course Elapsed Days: 37
Plan Fractions Treated to Date: 27
Plan Prescribed Dose Per Fraction: 2 Gy
Plan Total Fractions Prescribed: 33
Plan Total Prescribed Dose: 66 Gy
Reference Point Dosage Given to Date: 54 Gy
Reference Point Session Dosage Given: 2 Gy
Session Number: 27

## 2023-10-16 ENCOUNTER — Other Ambulatory Visit: Payer: Self-pay

## 2023-10-16 ENCOUNTER — Inpatient Hospital Stay

## 2023-10-16 ENCOUNTER — Inpatient Hospital Stay
Admission: RE | Admit: 2023-10-16 | Discharge: 2023-10-16 | Disposition: A | Payer: Self-pay | Source: Ambulatory Visit | Attending: Radiation Oncology

## 2023-10-16 ENCOUNTER — Ambulatory Visit: Admitting: Neurology

## 2023-10-16 ENCOUNTER — Ambulatory Visit
Admission: RE | Admit: 2023-10-16 | Discharge: 2023-10-16 | Disposition: A | Source: Ambulatory Visit | Attending: Radiation Oncology

## 2023-10-16 VITALS — BP 122/70 | HR 81 | Temp 98.0°F | Resp 16 | Wt 192.5 lb

## 2023-10-16 DIAGNOSIS — C3412 Malignant neoplasm of upper lobe, left bronchus or lung: Secondary | ICD-10-CM | POA: Diagnosis not present

## 2023-10-16 DIAGNOSIS — D649 Anemia, unspecified: Secondary | ICD-10-CM

## 2023-10-16 LAB — CMP (CANCER CENTER ONLY)
ALT: 10 U/L (ref 0–44)
AST: 17 U/L (ref 15–41)
Albumin: 4.1 g/dL (ref 3.5–5.0)
Alkaline Phosphatase: 56 U/L (ref 38–126)
Anion gap: 4 — ABNORMAL LOW (ref 5–15)
BUN: 14 mg/dL (ref 8–23)
CO2: 27 mmol/L (ref 22–32)
Calcium: 8.8 mg/dL — ABNORMAL LOW (ref 8.9–10.3)
Chloride: 109 mmol/L (ref 98–111)
Creatinine: 1.28 mg/dL — ABNORMAL HIGH (ref 0.61–1.24)
GFR, Estimated: 60 mL/min — ABNORMAL LOW (ref >60)
Glucose, Bld: 95 mg/dL (ref 70–99)
Potassium: 3.8 mmol/L (ref 3.5–5.1)
Sodium: 140 mmol/L (ref 135–145)
Total Bilirubin: 0.3 mg/dL (ref 0.0–1.2)
Total Protein: 7 g/dL (ref 6.5–8.1)

## 2023-10-16 LAB — CBC WITH DIFFERENTIAL (CANCER CENTER ONLY)
Abs Immature Granulocytes: 0.01 K/uL (ref 0.00–0.07)
Basophils Absolute: 0 K/uL (ref 0.0–0.1)
Basophils Relative: 1 %
Eosinophils Absolute: 0 K/uL (ref 0.0–0.5)
Eosinophils Relative: 0 %
HCT: 29.1 % — ABNORMAL LOW (ref 39.0–52.0)
Hemoglobin: 9.7 g/dL — ABNORMAL LOW (ref 13.0–17.0)
Immature Granulocytes: 0 %
Lymphocytes Relative: 13 %
Lymphs Abs: 0.3 K/uL — ABNORMAL LOW (ref 0.7–4.0)
MCH: 30.8 pg (ref 26.0–34.0)
MCHC: 33.3 g/dL (ref 30.0–36.0)
MCV: 92.4 fL (ref 80.0–100.0)
Monocytes Absolute: 0.3 K/uL (ref 0.1–1.0)
Monocytes Relative: 12 %
Neutro Abs: 1.9 K/uL (ref 1.7–7.7)
Neutrophils Relative %: 74 %
Platelet Count: 131 K/uL — ABNORMAL LOW (ref 150–400)
RBC: 3.15 MIL/uL — ABNORMAL LOW (ref 4.22–5.81)
RDW: 18.6 % — ABNORMAL HIGH (ref 11.5–15.5)
WBC Count: 2.5 K/uL — ABNORMAL LOW (ref 4.0–10.5)
nRBC: 0 % (ref 0.0–0.2)

## 2023-10-16 LAB — RAD ONC ARIA SESSION SUMMARY
Course Elapsed Days: 40
Plan Fractions Treated to Date: 28
Plan Prescribed Dose Per Fraction: 2 Gy
Plan Total Fractions Prescribed: 33
Plan Total Prescribed Dose: 66 Gy
Reference Point Dosage Given to Date: 56 Gy
Reference Point Session Dosage Given: 2 Gy
Session Number: 28

## 2023-10-16 LAB — SAMPLE TO BLOOD BANK

## 2023-10-16 MED ORDER — FAMOTIDINE IN NACL 20-0.9 MG/50ML-% IV SOLN
20.0000 mg | Freq: Once | INTRAVENOUS | Status: AC
  Administered 2023-10-16: 20 mg via INTRAVENOUS
  Filled 2023-10-16: qty 50

## 2023-10-16 MED ORDER — PALONOSETRON HCL INJECTION 0.25 MG/5ML
0.2500 mg | Freq: Once | INTRAVENOUS | Status: AC
  Administered 2023-10-16: 0.25 mg via INTRAVENOUS
  Filled 2023-10-16: qty 5

## 2023-10-16 MED ORDER — CETIRIZINE HCL 10 MG/ML IV SOLN
10.0000 mg | Freq: Once | INTRAVENOUS | Status: AC
  Administered 2023-10-16: 10 mg via INTRAVENOUS
  Filled 2023-10-16: qty 1

## 2023-10-16 MED ORDER — DEXAMETHASONE SODIUM PHOSPHATE 10 MG/ML IJ SOLN
10.0000 mg | Freq: Once | INTRAMUSCULAR | Status: AC
  Administered 2023-10-16: 10 mg via INTRAVENOUS
  Filled 2023-10-16: qty 1

## 2023-10-16 MED ORDER — SODIUM CHLORIDE 0.9 % IV SOLN: INTRAVENOUS | Status: DC

## 2023-10-16 MED ORDER — SODIUM CHLORIDE 0.9 % IV SOLN
181.0000 mg | Freq: Once | INTRAVENOUS | Status: AC
  Administered 2023-10-16: 180 mg via INTRAVENOUS
  Filled 2023-10-16: qty 18

## 2023-10-16 MED ORDER — SODIUM CHLORIDE 0.9 % IV SOLN
45.0000 mg/m2 | Freq: Once | INTRAVENOUS | Status: AC
  Administered 2023-10-16: 90 mg via INTRAVENOUS
  Filled 2023-10-16: qty 15

## 2023-10-16 NOTE — Patient Instructions (Addendum)
 [name], Mr. Bushey says that he notices feeling really sluggish after taking his morning medications.  I recommend that he check in with his PCP Dr. Joshua to make sure he's not experiencing excessive tiredness from the combination of medications he's taking.  [facility] - A DEPT OF [facility]  Discharge Instructions: Thank you for choosing [facility] to provide your oncology and hematology care.   If you have a lab appointment with the Cancer Center, please go directly to the Cancer Center and check in at the registration area.   Wear comfortable clothing and clothing appropriate for easy access to any Portacath or PICC line.   We strive to give you quality time with your provider. You may need to reschedule your appointment if you arrive late (15 or more minutes).  Arriving late affects you and other patients whose appointments are after yours.  Also, if you miss three or more appointments without notifying the office, you may be dismissed from the clinic at the provider's discretion.      For prescription refill requests, have your pharmacy contact our office and allow 72 hours for refills to be completed.    Today you received the following chemotherapy and/or immunotherapy agents: Paclitaxel , Carboplatin       To help prevent nausea and vomiting after your treatment, we encourage you to take your nausea medication as directed.  BELOW ARE SYMPTOMS THAT SHOULD BE REPORTED IMMEDIATELY: *FEVER GREATER THAN 100.4 F (38 C) OR HIGHER *CHILLS OR SWEATING *NAUSEA AND VOMITING THAT IS NOT CONTROLLED WITH YOUR NAUSEA MEDICATION *UNUSUAL SHORTNESS OF BREATH *UNUSUAL BRUISING OR BLEEDING *URINARY PROBLEMS (pain or burning when urinating, or frequent urination) *BOWEL PROBLEMS (unusual diarrhea, constipation, pain near the anus) TENDERNESS IN MOUTH AND THROAT WITH OR WITHOUT PRESENCE OF ULCERS (sore throat, sores in mouth, or a toothache) UNUSUAL  RASH, SWELLING OR PAIN  UNUSUAL VAGINAL DISCHARGE OR ITCHING   Items with * indicate a potential emergency and should be followed up as soon as possible or go to the Emergency Department if any problems should occur.  Please show the CHEMOTHERAPY ALERT CARD or IMMUNOTHERAPY ALERT CARD at check-in to the Emergency Department and triage nurse.  Should you have questions after your visit or need to cancel or reschedule your appointment, please contact [facility] - A DEPT OF [facility]  Dept: [phone]  and follow the prompts.  Office hours are 8:00 a.m. to 4:30 p.m. Monday - Friday. Please note that voicemails left after 4:00 p.m. may not be returned until the following business day.  We are closed weekends and major holidays. You have access to a nurse at all times for urgent questions. Please call the main number to the clinic Dept: [phone] and follow the prompts.   For any non-urgent questions, you may also contact your provider using MyChart. We now offer e-Visits for anyone 66 and older to request care online for non-urgent symptoms. For details visit [email or web address].   Also download the MyChart app! Go to the app store, search MyChart, open the app, select [place], and log in with your MyChart username and password.

## 2023-10-17 ENCOUNTER — Other Ambulatory Visit: Payer: Self-pay

## 2023-10-17 ENCOUNTER — Ambulatory Visit
Admission: RE | Admit: 2023-10-17 | Discharge: 2023-10-17 | Disposition: A | Payer: Self-pay | Source: Ambulatory Visit | Attending: Radiation Oncology

## 2023-10-17 DIAGNOSIS — C3412 Malignant neoplasm of upper lobe, left bronchus or lung: Secondary | ICD-10-CM | POA: Diagnosis not present

## 2023-10-17 LAB — RAD ONC ARIA SESSION SUMMARY
Course Elapsed Days: 41
Plan Fractions Treated to Date: 29
Plan Prescribed Dose Per Fraction: 2 Gy
Plan Total Fractions Prescribed: 33
Plan Total Prescribed Dose: 66 Gy
Reference Point Dosage Given to Date: 58 Gy
Reference Point Session Dosage Given: 2 Gy
Session Number: 29

## 2023-10-18 ENCOUNTER — Ambulatory Visit
Admission: RE | Admit: 2023-10-18 | Discharge: 2023-10-18 | Disposition: A | Discharge status: Home / Self Care | Payer: Self-pay | Source: Ambulatory Visit | Attending: Radiation Oncology | Admitting: Radiation Oncology

## 2023-10-18 ENCOUNTER — Inpatient Hospital Stay: Attending: Internal Medicine

## 2023-10-18 ENCOUNTER — Other Ambulatory Visit: Payer: Self-pay

## 2023-10-18 DIAGNOSIS — Z510 Encounter for antineoplastic radiation therapy: Secondary | ICD-10-CM | POA: Diagnosis present

## 2023-10-18 DIAGNOSIS — C3412 Malignant neoplasm of upper lobe, left bronchus or lung: Secondary | ICD-10-CM | POA: Insufficient documentation

## 2023-10-18 DIAGNOSIS — Z79899 Other long term (current) drug therapy: Secondary | ICD-10-CM | POA: Insufficient documentation

## 2023-10-18 DIAGNOSIS — Z7902 Long term (current) use of antithrombotics/antiplatelets: Secondary | ICD-10-CM | POA: Insufficient documentation

## 2023-10-18 DIAGNOSIS — I130 Hypertensive heart and chronic kidney disease with heart failure and stage 1 through stage 4 chronic kidney disease, or unspecified chronic kidney disease: Secondary | ICD-10-CM | POA: Insufficient documentation

## 2023-10-18 DIAGNOSIS — I5032 Chronic diastolic (congestive) heart failure: Secondary | ICD-10-CM | POA: Insufficient documentation

## 2023-10-18 DIAGNOSIS — Z923 Personal history of irradiation: Secondary | ICD-10-CM | POA: Insufficient documentation

## 2023-10-18 DIAGNOSIS — Z9221 Personal history of antineoplastic chemotherapy: Secondary | ICD-10-CM | POA: Insufficient documentation

## 2023-10-18 DIAGNOSIS — R059 Cough, unspecified: Secondary | ICD-10-CM | POA: Insufficient documentation

## 2023-10-18 DIAGNOSIS — Z8673 Personal history of transient ischemic attack (TIA), and cerebral infarction without residual deficits: Secondary | ICD-10-CM | POA: Insufficient documentation

## 2023-10-18 DIAGNOSIS — D84821 Immunodeficiency due to drugs: Secondary | ICD-10-CM | POA: Insufficient documentation

## 2023-10-18 DIAGNOSIS — D6959 Other secondary thrombocytopenia: Secondary | ICD-10-CM | POA: Insufficient documentation

## 2023-10-18 DIAGNOSIS — T451X5A Adverse effect of antineoplastic and immunosuppressive drugs, initial encounter: Secondary | ICD-10-CM | POA: Insufficient documentation

## 2023-10-18 DIAGNOSIS — M129 Arthropathy, unspecified: Secondary | ICD-10-CM | POA: Insufficient documentation

## 2023-10-18 DIAGNOSIS — D709 Neutropenia, unspecified: Secondary | ICD-10-CM | POA: Insufficient documentation

## 2023-10-18 DIAGNOSIS — K219 Gastro-esophageal reflux disease without esophagitis: Secondary | ICD-10-CM | POA: Insufficient documentation

## 2023-10-18 DIAGNOSIS — J449 Chronic obstructive pulmonary disease, unspecified: Secondary | ICD-10-CM | POA: Insufficient documentation

## 2023-10-18 DIAGNOSIS — E785 Hyperlipidemia, unspecified: Secondary | ICD-10-CM | POA: Insufficient documentation

## 2023-10-18 DIAGNOSIS — Z79624 Long term (current) use of inhibitors of nucleotide synthesis: Secondary | ICD-10-CM | POA: Insufficient documentation

## 2023-10-18 DIAGNOSIS — K589 Irritable bowel syndrome without diarrhea: Secondary | ICD-10-CM | POA: Insufficient documentation

## 2023-10-18 LAB — RAD ONC ARIA SESSION SUMMARY
Course Elapsed Days: 42
Plan Fractions Treated to Date: 30
Plan Prescribed Dose Per Fraction: 2 Gy
Plan Total Fractions Prescribed: 33
Plan Total Prescribed Dose: 66 Gy
Reference Point Dosage Given to Date: 60 Gy
Reference Point Session Dosage Given: 2 Gy
Session Number: 30

## 2023-10-19 ENCOUNTER — Ambulatory Visit
Admission: RE | Admit: 2023-10-19 | Discharge: 2023-10-19 | Disposition: A | Discharge status: Home / Self Care | Payer: Self-pay | Source: Ambulatory Visit | Attending: Radiation Oncology | Admitting: Radiation Oncology

## 2023-10-19 ENCOUNTER — Inpatient Hospital Stay

## 2023-10-19 ENCOUNTER — Other Ambulatory Visit: Payer: Self-pay

## 2023-10-19 DIAGNOSIS — Z510 Encounter for antineoplastic radiation therapy: Secondary | ICD-10-CM | POA: Diagnosis not present

## 2023-10-19 LAB — RAD ONC ARIA SESSION SUMMARY
Course Elapsed Days: 43
Plan Fractions Treated to Date: 31
Plan Prescribed Dose Per Fraction: 2 Gy
Plan Total Fractions Prescribed: 33
Plan Total Prescribed Dose: 66 Gy
Reference Point Dosage Given to Date: 62 Gy
Reference Point Session Dosage Given: 2 Gy
Session Number: 31

## 2023-10-20 ENCOUNTER — Ambulatory Visit
Admission: RE | Admit: 2023-10-20 | Discharge: 2023-10-20 | Disposition: A | Discharge status: Home / Self Care | Payer: Self-pay | Source: Ambulatory Visit | Attending: Radiation Oncology | Admitting: Radiation Oncology

## 2023-10-20 ENCOUNTER — Telehealth: Payer: Self-pay

## 2023-10-20 ENCOUNTER — Inpatient Hospital Stay

## 2023-10-20 ENCOUNTER — Other Ambulatory Visit: Payer: Self-pay

## 2023-10-20 DIAGNOSIS — Z510 Encounter for antineoplastic radiation therapy: Secondary | ICD-10-CM | POA: Diagnosis not present

## 2023-10-20 LAB — RAD ONC ARIA SESSION SUMMARY
Course Elapsed Days: 44
Plan Fractions Treated to Date: 32
Plan Prescribed Dose Per Fraction: 2 Gy
Plan Total Fractions Prescribed: 33
Plan Total Prescribed Dose: 66 Gy
Reference Point Dosage Given to Date: 64 Gy
Reference Point Session Dosage Given: 2 Gy
Session Number: 32

## 2023-10-20 NOTE — Telephone Encounter (Signed)
 Spoke with patients daughter inquiring about if her dad could get a manicure/pedicure.  Per Dr. Jeannett note- Advise that manicure is permissible with caution to avoid injury. She voiced understanding.

## 2023-10-23 ENCOUNTER — Ambulatory Visit
Admission: RE | Admit: 2023-10-23 | Discharge: 2023-10-23 | Disposition: A | Discharge status: Home / Self Care | Source: Ambulatory Visit | Attending: Radiation Oncology | Admitting: Radiation Oncology

## 2023-10-23 ENCOUNTER — Ambulatory Visit: Admitting: Physician Assistant

## 2023-10-23 ENCOUNTER — Ambulatory Visit
Admission: RE | Admit: 2023-10-23 | Discharge: 2023-10-23 | Disposition: A | Payer: Self-pay | Source: Ambulatory Visit | Attending: Radiation Oncology

## 2023-10-23 ENCOUNTER — Other Ambulatory Visit: Payer: Self-pay

## 2023-10-23 DIAGNOSIS — Z510 Encounter for antineoplastic radiation therapy: Secondary | ICD-10-CM | POA: Diagnosis not present

## 2023-10-23 LAB — RAD ONC ARIA SESSION SUMMARY
Course Elapsed Days: 47
Plan Fractions Treated to Date: 33
Plan Prescribed Dose Per Fraction: 2 Gy
Plan Total Fractions Prescribed: 33
Plan Total Prescribed Dose: 66 Gy
Reference Point Dosage Given to Date: 66 Gy
Reference Point Session Dosage Given: 2 Gy
Session Number: 33

## 2023-10-24 ENCOUNTER — Telehealth: Payer: Self-pay | Admitting: Internal Medicine

## 2023-10-24 ENCOUNTER — Inpatient Hospital Stay (HOSPITAL_BASED_OUTPATIENT_CLINIC_OR_DEPARTMENT_OTHER): Admitting: Internal Medicine

## 2023-10-24 ENCOUNTER — Other Ambulatory Visit: Payer: Self-pay

## 2023-10-24 ENCOUNTER — Inpatient Hospital Stay

## 2023-10-24 VITALS — BP 134/82 | HR 80 | Temp 97.5°F | Resp 17 | Ht 67.0 in | Wt 195.3 lb

## 2023-10-24 DIAGNOSIS — C3412 Malignant neoplasm of upper lobe, left bronchus or lung: Secondary | ICD-10-CM | POA: Diagnosis present

## 2023-10-24 DIAGNOSIS — K589 Irritable bowel syndrome without diarrhea: Secondary | ICD-10-CM | POA: Diagnosis not present

## 2023-10-24 DIAGNOSIS — Z7902 Long term (current) use of antithrombotics/antiplatelets: Secondary | ICD-10-CM | POA: Diagnosis not present

## 2023-10-24 DIAGNOSIS — C3490 Malignant neoplasm of unspecified part of unspecified bronchus or lung: Secondary | ICD-10-CM

## 2023-10-24 DIAGNOSIS — D508 Other iron deficiency anemias: Secondary | ICD-10-CM

## 2023-10-24 DIAGNOSIS — D84821 Immunodeficiency due to drugs: Secondary | ICD-10-CM | POA: Diagnosis not present

## 2023-10-24 DIAGNOSIS — D709 Neutropenia, unspecified: Secondary | ICD-10-CM | POA: Diagnosis not present

## 2023-10-24 DIAGNOSIS — E785 Hyperlipidemia, unspecified: Secondary | ICD-10-CM | POA: Diagnosis not present

## 2023-10-24 DIAGNOSIS — Z9221 Personal history of antineoplastic chemotherapy: Secondary | ICD-10-CM | POA: Diagnosis not present

## 2023-10-24 DIAGNOSIS — D6959 Other secondary thrombocytopenia: Secondary | ICD-10-CM | POA: Diagnosis not present

## 2023-10-24 DIAGNOSIS — I130 Hypertensive heart and chronic kidney disease with heart failure and stage 1 through stage 4 chronic kidney disease, or unspecified chronic kidney disease: Secondary | ICD-10-CM | POA: Diagnosis not present

## 2023-10-24 DIAGNOSIS — K219 Gastro-esophageal reflux disease without esophagitis: Secondary | ICD-10-CM | POA: Diagnosis not present

## 2023-10-24 DIAGNOSIS — Z79899 Other long term (current) drug therapy: Secondary | ICD-10-CM | POA: Diagnosis not present

## 2023-10-24 DIAGNOSIS — J449 Chronic obstructive pulmonary disease, unspecified: Secondary | ICD-10-CM | POA: Diagnosis not present

## 2023-10-24 DIAGNOSIS — Z8673 Personal history of transient ischemic attack (TIA), and cerebral infarction without residual deficits: Secondary | ICD-10-CM | POA: Diagnosis not present

## 2023-10-24 DIAGNOSIS — I5032 Chronic diastolic (congestive) heart failure: Secondary | ICD-10-CM | POA: Diagnosis not present

## 2023-10-24 DIAGNOSIS — T451X5A Adverse effect of antineoplastic and immunosuppressive drugs, initial encounter: Secondary | ICD-10-CM | POA: Diagnosis not present

## 2023-10-24 DIAGNOSIS — R059 Cough, unspecified: Secondary | ICD-10-CM | POA: Diagnosis not present

## 2023-10-24 DIAGNOSIS — Z79624 Long term (current) use of inhibitors of nucleotide synthesis: Secondary | ICD-10-CM | POA: Diagnosis not present

## 2023-10-24 DIAGNOSIS — Z923 Personal history of irradiation: Secondary | ICD-10-CM | POA: Diagnosis not present

## 2023-10-24 DIAGNOSIS — M129 Arthropathy, unspecified: Secondary | ICD-10-CM | POA: Diagnosis not present

## 2023-10-24 LAB — CBC WITH DIFFERENTIAL (CANCER CENTER ONLY)
Abs Immature Granulocytes: 0.04 K/uL (ref 0.00–0.07)
Basophils Absolute: 0 K/uL (ref 0.0–0.1)
Basophils Relative: 1 %
Eosinophils Absolute: 0 K/uL (ref 0.0–0.5)
Eosinophils Relative: 0 %
HCT: 27.2 % — ABNORMAL LOW (ref 39.0–52.0)
Hemoglobin: 9.2 g/dL — ABNORMAL LOW (ref 13.0–17.0)
Immature Granulocytes: 2 %
Lymphocytes Relative: 14 %
Lymphs Abs: 0.3 K/uL — ABNORMAL LOW (ref 0.7–4.0)
MCH: 31.1 pg (ref 26.0–34.0)
MCHC: 33.8 g/dL (ref 30.0–36.0)
MCV: 91.9 fL (ref 80.0–100.0)
Monocytes Absolute: 0.2 K/uL (ref 0.1–1.0)
Monocytes Relative: 10 %
Neutro Abs: 1.6 K/uL — ABNORMAL LOW (ref 1.7–7.7)
Neutrophils Relative %: 73 %
Platelet Count: 64 K/uL — ABNORMAL LOW (ref 150–400)
RBC: 2.96 MIL/uL — ABNORMAL LOW (ref 4.22–5.81)
RDW: 18.7 % — ABNORMAL HIGH (ref 11.5–15.5)
WBC Count: 2.2 K/uL — ABNORMAL LOW (ref 4.0–10.5)
nRBC: 0 % (ref 0.0–0.2)

## 2023-10-24 LAB — CMP (CANCER CENTER ONLY)
ALT: 11 U/L (ref 0–44)
AST: 17 U/L (ref 15–41)
Albumin: 4.1 g/dL (ref 3.5–5.0)
Alkaline Phosphatase: 60 U/L (ref 38–126)
Anion gap: 6 (ref 5–15)
BUN: 14 mg/dL (ref 8–23)
CO2: 25 mmol/L (ref 22–32)
Calcium: 8.9 mg/dL (ref 8.9–10.3)
Chloride: 107 mmol/L (ref 98–111)
Creatinine: 1.23 mg/dL (ref 0.61–1.24)
GFR, Estimated: >60 mL/min (ref >60)
Glucose, Bld: 100 mg/dL — ABNORMAL HIGH (ref 70–99)
Potassium: 3.4 mmol/L — ABNORMAL LOW (ref 3.5–5.1)
Sodium: 138 mmol/L (ref 135–145)
Total Bilirubin: 0.3 mg/dL (ref 0.0–1.2)
Total Protein: 7.2 g/dL (ref 6.5–8.1)

## 2023-10-24 MED ORDER — SODIUM CHLORIDE 0.9% FLUSH
10.0000 mL | Freq: Once | INTRAVENOUS | Status: AC | PRN
  Administered 2023-10-24: 10 mL

## 2023-10-24 NOTE — Progress Notes (Signed)
 [facility] [phone]   [phone]  OFFICE PROGRESS NOTE  [name], MD [address] [place] [address]  DIAGNOSIS:  Stage IIIA (T3, N1, M0) non-small cell lung cancer, adenocarcinoma presented with large left upper lobe lung mass in addition to left hilar lymphadenopathy diagnosed in August 2025.    Molecular Studies:  Biomarker Findings Tumor Mutational Burden - 13 Muts/Mb HRD signature - HRDsig Negative Microsatellite status - MS-Equivocal ? Genomic Findings For a complete list of the genes assayed, please refer to the Appendix. MET exon 14 splice site ([id number]>[id number]), amplification CDK4 amplification MDM2 amplification PIK3CA [id number], [id number]   PDL1: 99%   PRIOR THERAPY: None   CURRENT THERAPY:  1) Concurrent chemoradiation with carboplatin  for an AUC of 2 and taxol  45 mg/m2. First dose on 09/19/23. Status post 5 cycles.  Last dose was given on 10/16/2023. 2) Fereheme 510 mg weekly x2. First dose on 09/26/23.   INTERVAL HISTORY: [name] 73 y.o. male returns to the clinic today for follow-up visit and his daughter [name] was available by phone during the visit.Discussed the use of AI scribe software for clinical note transcription with the patient, who gave verbal consent to proceed.  History of Present Illness [name] is a 73 year old male with stage IIIA non-small cell lung cancer who presents for follow-up after completing concurrent chemoradiation.  He was diagnosed with stage IIIA non-small cell lung cancer, adenocarcinoma with a positive MET exon 14 splice mutation, in August 2025, and has a PD-L1 expression of 99%. He recently completed a course of concurrent chemoradiation with weekly carboplatin  and paclitaxel , following five cycles of treatment.  He feels 'very wiped out', which he attributes to the recent completion of radiation therapy.  He continues to experience a persistent cough, which he describes as  'bad'. He is using Delsym  as a cough suppressant.  He has difficulty breathing, particularly when wearing a mask.  His blood counts are currently low, with low platelets and white blood cell count.      MEDICAL HISTORY: Past Medical History:  Diagnosis Date   (HFpEF) heart failure with preserved ejection fraction (HCC)    Allergy    Anxiety    Arthritis    Chronic kidney disease    Depression    Frequent urination    GERD (gastroesophageal reflux disease)    Gout    Headache    mirgaines in the past   Herpes    History of nuclear stress test    Myoview  05/2021: EF 77, normal perfusion, low risk   Hyperlipidemia    Hypertension    IBS (irritable bowel syndrome)    Memory loss    MGUS (monoclonal gammopathy of unknown significance)    Obesity    Pre-diabetes    Stroke (HCC)    Urgency of urination     ALLERGIES:  is allergic to divalproex sodium, lamotrigine, valproic acid, diphenhydramine , amitriptyline, aspirin , hydrocodone , lisinopril, lithium, morphine , nsaids, oxycodone, oxycodone-acetaminophen , and pork allergy.  MEDICATIONS:  Current Outpatient Medications  Medication Sig Dispense Refill   acetaminophen  (TYLENOL ) 500 MG tablet Take 1-2 tablets (500-1,000 mg total) by mouth every 6 (six) hours as needed for moderate pain. 30 tablet 0   allopurinol  (ZYLOPRIM ) 100 MG tablet Take 150 mg by mouth daily.     amLODipine  (NORVASC ) 10 MG tablet Take 1 tablet (10 mg total) by mouth daily. 90 tablet 3   benzonatate  (TESSALON ) 200 MG capsule  Take 1 capsule (200 mg total) by mouth in the morning and at bedtime. 60 capsule 2   busPIRone  (BUSPAR ) 30 MG tablet Take 30 mg by mouth 2 (two) times daily.     cetirizine  (ZYRTEC  ALLERGY) 10 MG tablet Take 1 tablet (10 mg total) by mouth daily. 30 tablet 0   clindamycin (CLEOCIN) 300 MG capsule Take 300 mg by mouth 3 (three) times daily. (Patient not taking: Reported on 10/09/2023)     [Paused] clopidogrel  (PLAVIX ) 75 MG tablet Take 1  tablet (75 mg total) by mouth daily. Okay to restart this medication on 08/22/2023. (Patient not taking: Reported on 10/09/2023)     dextromethorphan  (DELSYM ) 30 MG/5ML liquid Take 5 mLs (30 mg total) by mouth 2 (two) times daily as needed for cough. (Patient taking differently: Take by mouth 2 (two) times daily as needed for cough. Pt states he takes it every 12 hours ATC) 89 mL 0   ferrous sulfate  325 (65 FE) MG EC tablet Take 1 tablet (325 mg total) by mouth daily with breakfast. 30 tablet 3   fluticasone  (FLONASE ) 50 MCG/ACT nasal spray Place 1 spray into both nostrils daily. (Patient not taking: Reported on 10/09/2023) 16 g 2   folic acid  (FOLVITE ) 1 MG tablet Take 1 mg by mouth daily.     furosemide  (LASIX ) 40 MG tablet Take 1 tablet (40 mg total) by mouth daily. 90 tablet 3   [Paused] hydrALAZINE  (APRESOLINE ) 50 MG tablet Take 1.5 tablets (75 mg total) by mouth 2 (two) times daily. (Patient not taking: Reported on 10/09/2023) 270 tablet 3   HYDROcodone  bit-homatropine (HYCODAN) 5-1.5 MG/5ML syrup Take 5 mLs by mouth every 4 (four) hours as needed (Persistent cough). (Patient not taking: Reported on 10/09/2023) 120 mL 0   Hydroxychloroquine  Sulfate 400 MG TABS Take 400 mg by mouth at bedtime.     Lactobacillus (ACIDOPHILUS) CAPS capsule Take 1 capsule by mouth daily. 100 capsule 0   lidocaine  (LIDODERM ) 5 % Place 1 patch onto the skin daily as needed (for pain- Remove & Discard patch within 12 hours or as directed by MD). (Patient not taking: Reported on 10/09/2023)     lidocaine -prilocaine  (EMLA ) cream Apply to affected area once (Patient not taking: Reported on 10/09/2023) 30 g 3   Multiple Vitamins-Minerals (MULTIVITAMIN) tablet Take 1 tablet by mouth daily. 30 tablet 1   ondansetron  (ZOFRAN ) 8 MG tablet Take 1 tablet (8 mg total) by mouth every 8 (eight) hours as needed for nausea or vomiting. Start on the third day after chemotherapy. (Patient not taking: Reported on 10/09/2023) 30 tablet 1    PARoxetine  (PAXIL -CR) 37.5 MG 24 hr tablet Take 1 tablet (37.5 mg total) by mouth at bedtime.     primidone  (MYSOLINE ) 250 MG tablet Take 1 tablet (250 mg total) by mouth 2 (two) times daily. 60 tablet 2   prochlorperazine  (COMPAZINE ) 10 MG tablet Take 1 tablet (10 mg total) by mouth every 6 (six) hours as needed for nausea or vomiting. (Patient not taking: Reported on 10/09/2023) 30 tablet 1   QUEtiapine  (SEROQUEL ) 25 MG tablet Take 25 mg by mouth at bedtime.     rosuvastatin  (CRESTOR ) 40 MG tablet Take 40 mg by mouth daily.     traZODone  (DESYREL ) 100 MG tablet Take 100 mg by mouth at bedtime.     valACYclovir  (VALTREX ) 1000 MG tablet Take 1,000 mg by mouth daily.     Vitamin D , Ergocalciferol , (DRISDOL ) 1.25 MG (50000 UNIT) CAPS capsule Take 50,000  Units by mouth every Saturday.     No current facility-administered medications for this visit.    SURGICAL HISTORY:  Past Surgical History:  Procedure Laterality Date   CHOLECYSTECTOMY N/A 12/17/2019   Procedure: LAPAROSCOPIC CHOLECYSTECTOMY WITH ICG INJECTION;  Surgeon: Ebbie Cough, MD;  Location: [facility] OR;  Service: General;  Laterality: N/A;   disckec     IR IMAGING GUIDED PORT INSERTION  09/12/2023   shouler arthroscopy Left    SPINAL FUSION     TONSILLECTOMY     TOTAL KNEE ARTHROPLASTY Right 11/15/2021   Procedure: TOTAL KNEE ARTHROPLASTY;  Surgeon: Melodi Lerner, MD;  Location: [facility] ORS;  Service: Orthopedics;  Laterality: Right;   VIDEO BRONCHOSCOPY WITH ENDOBRONCHIAL NAVIGATION Left 08/21/2023   Procedure: VIDEO BRONCHOSCOPY WITH ENDOBRONCHIAL NAVIGATION;  Surgeon: Shelah Lamar RAMAN, MD;  Location: [facility] ENDOSCOPY;  Service: Pulmonary;  Laterality: Left;   VIDEO BRONCHOSCOPY WITH ENDOBRONCHIAL ULTRASOUND  08/21/2023   Procedure: BRONCHOSCOPY, WITH EBUS;  Surgeon: Shelah Lamar RAMAN, MD;  Location: [facility] ENDOSCOPY;  Service: Pulmonary;;   VIDEO BRONCHOSCOPY WITH RADIAL ENDOBRONCHIAL ULTRASOUND  08/21/2023   Procedure: VIDEO BRONCHOSCOPY WITH RADIAL  ENDOBRONCHIAL ULTRASOUND;  Surgeon: Shelah Lamar RAMAN, MD;  Location: [facility] ENDOSCOPY;  Service: Pulmonary;;   WISDOM TOOTH EXTRACTION      REVIEW OF SYSTEMS:  Constitutional: positive for fatigue Eyes: negative Ears, nose, mouth, throat, and face: negative Respiratory: positive for cough and dyspnea on exertion Cardiovascular: negative Gastrointestinal: positive for nausea Genitourinary:negative Integument/breast: negative Hematologic/lymphatic: negative Musculoskeletal:positive for arthralgias Neurological: negative Behavioral/Psych: negative Endocrine: negative Allergic/Immunologic: negative   PHYSICAL EXAMINATION: General appearance: alert, cooperative, fatigued, and no distress Head: Normocephalic, without obvious abnormality, atraumatic Neck: no adenopathy, no JVD, supple, symmetrical, trachea midline, and thyroid  not enlarged, symmetric, no tenderness/mass/nodules Lymph nodes: Cervical, supraclavicular, and axillary nodes normal. Resp: clear to auscultation bilaterally Back: symmetric, no curvature. ROM normal. No CVA tenderness. Cardio: regular rate and rhythm, S1, S2 normal, no murmur, click, rub or gallop GI: soft, non-tender; bowel sounds normal; no masses,  no organomegaly Extremities: extremities normal, atraumatic, no cyanosis or edema Neurologic: Alert and oriented X 3, normal strength and tone. Normal symmetric reflexes. Normal coordination and gait  ECOG PERFORMANCE STATUS: 1 - Symptomatic but completely ambulatory  Blood pressure 134/82, pulse 80, temperature (!) 97.5 F (36.4 C), resp. rate 17, height 5' 7 (1.702 m), weight 195 lb 4.8 oz (88.6 kg), SpO2 100%.  LABORATORY DATA: Lab Results  Component Value Date   WBC 2.2 (L) 10/24/2023   HGB 9.2 (L) 10/24/2023   HCT 27.2 (L) 10/24/2023   MCV 91.9 10/24/2023   PLT 64 (L) 10/24/2023      Chemistry      Component Value Date/Time   NA 140 10/16/2023 0919   NA 140 07/27/2023 1454   K 3.8 10/16/2023 0919    CL 109 10/16/2023 0919   CO2 27 10/16/2023 0919   BUN 14 10/16/2023 0919   BUN 15 07/27/2023 1454   CREATININE 1.28 (H) 10/16/2023 0919      Component Value Date/Time   CALCIUM  8.8 (L) 10/16/2023 0919   ALKPHOS 56 10/16/2023 0919   AST 17 10/16/2023 0919   ALT 10 10/16/2023 0919   BILITOT 0.3 10/16/2023 0919       RADIOGRAPHIC STUDIES: No results found.   ASSESSMENT AND PLAN: This is a 73 years old African-American male with Stage IIIA (T3, N1, M0) non-small cell lung cancer, adenocarcinoma presented with large left upper lobe lung mass in addition to left  hilar lymphadenopathy diagnosed in August 2025.  Molecular study showed positive met exon 14 splice mutation and PD-L1 expression of 99%. He is currently undergoing a course of concurrent chemoradiation with weekly carboplatin  for AUC of 2 and paclitaxel  45 mg/M2.  Status post 5 cycles.  Last dose of treatment was given on 09/26/2023.  He tolerated the treatment well except for the increasing fatigue and cough. Assessment and Plan Assessment & Plan Stage IIIA non-small cell lung cancer, adenocarcinoma with positive MET exon 14 splice mutation and high PD-L1 expression, status post concurrent chemoradiation Completed concurrent chemoradiation with weekly carboplatin  and paclitaxel . Awaiting assessment of treatment efficacy. High PD-L1 expression suggests potential benefit from immunotherapy. Complete resolution is rare without surgical intervention; goal is tumor control and potential shrinkage. - Order chest CT in three weeks to assess treatment response - Discuss potential for immunotherapy based on scan results - Educate on realistic expectations regarding tumor shrinkage and potential outcomes  Chemotherapy-induced cytopenia (neutropenia and thrombocytopenia) Experiencing low platelet and white blood cell counts post-chemoradiation, expected to recover in two to three weeks. Currently immunocompromised. - Monitor blood counts -  Advise caution to avoid infection until blood counts recover - Avoid procedures that may cause cuts or infections until counts improve  Chronic obstructive pulmonary disease (COPD) COPD contributing to breathing difficulties, requires ongoing management. - Refer to pulmonologist Dr. MALVA Low for ongoing management  Cough Persistent cough, likely multifactorial including COPD and recent chemoradiation. He was advised to call immediately if he has any concerning symptoms in the interval.  The patient voices understanding of current disease status and treatment options and is in agreement with the current care plan.  All questions were answered. The patient knows to call the clinic with any problems, questions or concerns. We can certainly see the patient much sooner if necessary. The total time spent in the appointment was 30 minutes including review of chart and various tests results, discussions about plan of care and coordination of care plan .   Disclaimer: This note was dictated with voice recognition software. Similar sounding words can inadvertently be transcribed and may not be corrected upon review.

## 2023-10-24 NOTE — Telephone Encounter (Signed)
 Scheduled patient for next appointments, port flush w labs and a follow-up. Called and left a voicemail with appointment details.

## 2023-10-24 NOTE — Radiation Completion Notes (Signed)
 Patient Name: [name], [name] MRN: [id number] Date of Birth: Mar 24, 1950 Referring Physician: AURORA MOLT, M.D. Date of Service: 2023-10-24 Radiation Oncologist: Lauraine Golden, M.D. [facility]                             RADIATION ONCOLOGY END OF TREATMENT NOTE     Diagnosis: C34.12 Malignant neoplasm of upper lobe, left bronchus or lung Staging on 2023-09-08: Adenocarcinoma of upper lobe of left lung (HCC) T=pT3, N=pN1, M=cM0 Staging on 2023-08-29: Adenocarcinoma of upper lobe of left lung (HCC) T=cT3, N=cN1, M=cM0 Intent: Curative     ==========DELIVERED PLANS==========  First Treatment Date: 2023-09-06 Last Treatment Date: 2023-10-23   Plan Name: Lung_L Site: Lung, Left Technique: IMRT Mode: Photon Dose Per Fraction: 2 Gy Prescribed Dose (Delivered / Prescribed): 66 Gy / 66 Gy Prescribed Fxs (Delivered / Prescribed): 33 / 33     ==========ON TREATMENT VISIT DATES========== 2023-09-11, 2023-09-19, 2023-09-25, 2023-09-28, 2023-10-02, 2023-10-09, 2023-10-16, 2023-10-23     ==========UPCOMING VISITS========== 12/05/2023 [facility]-RADIATION ONC FOLLOW UP 30 Wyatt Leeroy HERO, [place]  11/28/2023 [facility] MANUAL OFFICE VISIT 30 Rosemarie Eather RAMAN, [place]  11/21/2023 [facility]-MED ONCOLOGY EST PT 15 Sherrod Sherrod, MD  11/20/2023 [facility] OFFICE VISIT Lelon Glendia DASEN, PA-C  11/14/2023 [facility] IMAGING CT CHEST W CONTRAST [facility]-CT 2  11/14/2023 [facility]MED ONCOLOGY PORT FLUSH W/LAB [facility] MED[facility]C FLUSH  10/24/2023 [facility] ONCOLOGY EST PT 15 Sherrod Sherrod, [place]  10/24/2023 [facility] ONCOLOGY PORT FLUSH W/LAB [facility] FLUSH        ==========APPENDIX - ON TREATMENT VISIT NOTES==========   See weekly On Treatment Notes in Epic for details in the Media tab (listed as Progress notes on the On Treatment Visit Dates listed above).

## 2023-10-30 ENCOUNTER — Inpatient Hospital Stay

## 2023-11-14 ENCOUNTER — Inpatient Hospital Stay

## 2023-11-14 ENCOUNTER — Ambulatory Visit (HOSPITAL_COMMUNITY)
Admission: RE | Admit: 2023-11-14 | Discharge: 2023-11-14 | Disposition: A | Discharge status: Home / Self Care | Source: Ambulatory Visit | Attending: Internal Medicine | Admitting: Internal Medicine

## 2023-11-14 DIAGNOSIS — C3490 Malignant neoplasm of unspecified part of unspecified bronchus or lung: Secondary | ICD-10-CM | POA: Insufficient documentation

## 2023-11-14 DIAGNOSIS — C3412 Malignant neoplasm of upper lobe, left bronchus or lung: Secondary | ICD-10-CM | POA: Diagnosis not present

## 2023-11-14 LAB — CBC WITH DIFFERENTIAL (CANCER CENTER ONLY)
Abs Immature Granulocytes: 0.01 K/uL (ref 0.00–0.07)
Basophils Absolute: 0 K/uL (ref 0.0–0.1)
Basophils Relative: 1 %
Eosinophils Absolute: 0 K/uL (ref 0.0–0.5)
Eosinophils Relative: 1 %
HCT: 30.4 % — ABNORMAL LOW (ref 39.0–52.0)
Hemoglobin: 10.7 g/dL — ABNORMAL LOW (ref 13.0–17.0)
Immature Granulocytes: 0 %
Lymphocytes Relative: 21 %
Lymphs Abs: 0.7 K/uL (ref 0.7–4.0)
MCH: 31.8 pg (ref 26.0–34.0)
MCHC: 35.2 g/dL (ref 30.0–36.0)
MCV: 90.5 fL (ref 80.0–100.0)
Monocytes Absolute: 0.7 K/uL (ref 0.1–1.0)
Monocytes Relative: 20 %
Neutro Abs: 1.8 K/uL (ref 1.7–7.7)
Neutrophils Relative %: 57 %
Platelet Count: 256 K/uL (ref 150–400)
RBC: 3.36 MIL/uL — ABNORMAL LOW (ref 4.22–5.81)
RDW: 18.8 % — ABNORMAL HIGH (ref 11.5–15.5)
WBC Count: 3.2 K/uL — ABNORMAL LOW (ref 4.0–10.5)
nRBC: 0 % (ref 0.0–0.2)

## 2023-11-14 LAB — CMP (CANCER CENTER ONLY)
ALT: 8 U/L (ref 0–44)
AST: 15 U/L (ref 15–41)
Albumin: 4 g/dL (ref 3.5–5.0)
Alkaline Phosphatase: 73 U/L (ref 38–126)
Anion gap: 6 (ref 5–15)
BUN: 8 mg/dL (ref 8–23)
CO2: 28 mmol/L (ref 22–32)
Calcium: 8.8 mg/dL — ABNORMAL LOW (ref 8.9–10.3)
Chloride: 103 mmol/L (ref 98–111)
Creatinine: 1.32 mg/dL — ABNORMAL HIGH (ref 0.61–1.24)
GFR, Estimated: 58 mL/min — ABNORMAL LOW (ref >60)
Glucose, Bld: 100 mg/dL — ABNORMAL HIGH (ref 70–99)
Potassium: 3.5 mmol/L (ref 3.5–5.1)
Sodium: 137 mmol/L (ref 135–145)
Total Bilirubin: 0.3 mg/dL (ref 0.0–1.2)
Total Protein: 7.3 g/dL (ref 6.5–8.1)

## 2023-11-14 MED ORDER — SODIUM CHLORIDE (PF) 0.9 % IJ SOLN
INTRAMUSCULAR | Status: AC
  Filled 2023-11-14: qty 50

## 2023-11-14 MED ORDER — IOHEXOL 300 MG/ML  SOLN
75.0000 mL | Freq: Once | INTRAMUSCULAR | Status: AC | PRN
  Administered 2023-11-14: 75 mL via INTRAVENOUS

## 2023-11-15 ENCOUNTER — Other Ambulatory Visit: Payer: Self-pay | Admitting: Internal Medicine

## 2023-11-16 ENCOUNTER — Encounter: Payer: Self-pay | Admitting: *Deleted

## 2023-11-18 ENCOUNTER — Encounter (HOSPITAL_COMMUNITY): Payer: Self-pay

## 2023-11-18 ENCOUNTER — Emergency Department (HOSPITAL_COMMUNITY)
Admission: EM | Admit: 2023-11-18 | Discharge: 2023-11-18 | Disposition: A | Discharge status: Home / Self Care | Attending: Emergency Medicine | Admitting: Emergency Medicine

## 2023-11-18 ENCOUNTER — Other Ambulatory Visit: Payer: Self-pay

## 2023-11-18 ENCOUNTER — Emergency Department (HOSPITAL_COMMUNITY)

## 2023-11-18 DIAGNOSIS — M546 Pain in thoracic spine: Secondary | ICD-10-CM | POA: Insufficient documentation

## 2023-11-18 DIAGNOSIS — M7918 Myalgia, other site: Secondary | ICD-10-CM

## 2023-11-18 LAB — URINALYSIS, ROUTINE W REFLEX MICROSCOPIC
Bilirubin Urine: NEGATIVE
Glucose, UA: NEGATIVE mg/dL
Hgb urine dipstick: NEGATIVE
Ketones, ur: NEGATIVE mg/dL
Leukocytes,Ua: NEGATIVE
Nitrite: NEGATIVE
Protein, ur: NEGATIVE mg/dL
Specific Gravity, Urine: 1.01 (ref 1.005–1.030)
pH: 6 (ref 5.0–8.0)

## 2023-11-18 MED ORDER — METHOCARBAMOL 500 MG PO TABS: 500.0000 mg | ORAL_TABLET | Freq: Two times a day (BID) | ORAL | 0 refills | Status: DC

## 2023-11-18 MED ORDER — LIDOCAINE 5 % EX OINT: 1.0000 | TOPICAL_OINTMENT | CUTANEOUS | 0 refills | Status: AC | PRN

## 2023-11-18 MED ORDER — LIDOCAINE 5 % EX PTCH
1.0000 | MEDICATED_PATCH | CUTANEOUS | Status: DC
  Administered 2023-11-18: 1 via TRANSDERMAL
  Filled 2023-11-18: qty 1

## 2023-11-18 MED ORDER — METHOCARBAMOL 500 MG PO TABS
500.0000 mg | ORAL_TABLET | Freq: Once | ORAL | Status: AC
  Administered 2023-11-18: 500 mg via ORAL
  Filled 2023-11-18: qty 1

## 2023-11-18 NOTE — ED Triage Notes (Signed)
 Patient has left scapular pain that began Tuesday. Moves up to his left shoulder. No falls. Is feeling constipated. Last bowel movement was 3 days ago.

## 2023-11-18 NOTE — ED Provider Notes (Signed)
 [place] EMERGENCY DEPARTMENT AT [facility] Provider Note   CSN: [id number] Arrival date & time: 11/18/23  1153     Patient presents with: Constipation   [name] is a 73 y.o. male.   73 year old who presents with reproducible left parascapular pain times several days.  Pain is worse with certain movements and better with laying still.  Characterizes pain as sharp.  No associated shortness of breath.  Pain is not pleuritic.  Denies any history of trauma.  Has been using Tylenol  without relief       Prior to Admission medications   Medication Sig Start Date End Date Taking? Authorizing Provider  acetaminophen  (TYLENOL ) 500 MG tablet Take 1-2 tablets (500-1,000 mg total) by mouth every 6 (six) hours as needed for moderate pain. 11/18/21   Edmisten, Kristie L, PA  allopurinol  (ZYLOPRIM ) 100 MG tablet Take 150 mg by mouth daily. 09/14/22   [provider]  amLODipine  (NORVASC ) 10 MG tablet Take 1 tablet (10 mg total) by mouth daily. 07/26/23   Delford Maude BROCKS, MD  benzonatate  (TESSALON ) 200 MG capsule Take 1 capsule (200 mg total) by mouth in the morning and at bedtime. 09/01/23   Jadine Toribio SQUIBB, MD  busPIRone  (BUSPAR ) 30 MG tablet Take 30 mg by mouth 2 (two) times daily.    [provider]  cetirizine  (ZYRTEC  ALLERGY) 10 MG tablet Take 1 tablet (10 mg total) by mouth daily. 04/25/22   Christopher Savannah, PA-C  clindamycin (CLEOCIN) 300 MG capsule Take 300 mg by mouth 3 (three) times daily. Patient not taking: Reported on 10/09/2023 08/15/23   [provider]  clopidogrel  (PLAVIX ) 75 MG tablet Take 1 tablet (75 mg total) by mouth daily. Okay to restart this medication on 08/22/2023. Patient not taking: Reported on 10/09/2023 08/21/23   Shelah Lamar RAMAN, MD  dextromethorphan  (DELSYM ) 30 MG/5ML liquid Take 5 mLs (30 mg total) by mouth 2 (two) times daily as needed for cough. Patient taking differently: Take by mouth 2 (two) times daily as needed for cough. Pt  states he takes it every 12 hours ATC 08/27/23   Randol Simmonds, MD  ferrous sulfate  325 (65 FE) MG EC tablet Take 1 tablet (325 mg total) by mouth daily with breakfast. 09/25/23   Heilingoetter, Cassandra L, PA-C  fluticasone  (FLONASE ) 50 MCG/ACT nasal spray Place 1 spray into both nostrils daily. Patient not taking: Reported on 10/09/2023 09/02/23   Jadine Toribio SQUIBB, MD  folic acid  (FOLVITE ) 1 MG tablet Take 1 mg by mouth daily. 11/10/21   [provider]  furosemide  (LASIX ) 40 MG tablet Take 1 tablet (40 mg total) by mouth daily. 07/13/23   Santo Stanly LABOR, MD  hydrALAZINE  (APRESOLINE ) 50 MG tablet Take 1.5 tablets (75 mg total) by mouth 2 (two) times daily. Patient not taking: Reported on 10/09/2023 08/16/23   Santo Stanly LABOR, MD  HYDROcodone  bit-homatropine (HYCODAN) 5-1.5 MG/5ML syrup Take 5 mLs by mouth every 4 (four) hours as needed (Persistent cough). Patient not taking: Reported on 10/09/2023 09/01/23   Jadine Toribio SQUIBB, MD  Hydroxychloroquine  Sulfate 400 MG TABS Take 400 mg by mouth at bedtime.    [provider]  Lactobacillus (ACIDOPHILUS) CAPS capsule Take 1 capsule by mouth daily. 07/03/23   Vann, Jessica U, DO  lidocaine  (LIDODERM ) 5 % Place 1 patch onto the skin daily as needed (for pain- Remove & Discard patch within 12 hours or as directed by MD). Patient not taking: Reported on 10/09/2023  [provider]  Multiple Vitamin (MULTIVITAMIN) TABS TAKE 1 TABLET BY MOUTH DAILY. 11/15/23   Sherrod Sherrod, MD  Multiple Vitamins-Minerals (MULTIVITAMIN) tablet Take 1 tablet by mouth daily. 09/25/23   Sherrod Sherrod, MD  PARoxetine  (PAXIL -CR) 37.5 MG 24 hr tablet Take 1 tablet (37.5 mg total) by mouth at bedtime. 08/21/23   Shelah Lamar RAMAN, MD  primidone  (MYSOLINE ) 250 MG tablet Take 1 tablet (250 mg total) by mouth 2 (two) times daily. 09/04/23   Rosemarie Eather RAMAN, MD  QUEtiapine  (SEROQUEL ) 25 MG tablet Take 25 mg by mouth at bedtime.    [provider]  rosuvastatin  (CRESTOR ) 40 MG tablet Take 40 mg by mouth daily.    [provider]  traZODone  (DESYREL ) 100 MG tablet Take 100 mg by mouth at bedtime.    [provider]  valACYclovir  (VALTREX ) 1000 MG tablet Take 1,000 mg by mouth daily.    [provider]  Vitamin D , Ergocalciferol , (DRISDOL ) 1.25 MG (50000 UNIT) CAPS capsule Take 50,000 Units by mouth every Saturday. 09/17/20   [provider]    Allergies: Divalproex sodium, Lamotrigine, Valproic acid, Diphenhydramine , Amitriptyline, Aspirin , Hydrocodone , Lisinopril, Lithium, Morphine , Nsaids, Oxycodone, Oxycodone-acetaminophen , and Pork allergy    Review of Systems  All other systems reviewed and are negative.   Updated Vital Signs BP (!) 151/86   Pulse 88   Temp 98.6 F (37 C) (Oral)   Resp 19   Ht 1.702 m (5' 7)   Wt 89 kg   SpO2 98%   BMI 30.73 kg/m   Physical Exam Vitals and nursing note reviewed.  Constitutional:      General: He is not in acute distress.    Appearance: Normal appearance. He is well-developed. He is not toxic-appearing.  HENT:     Head: Normocephalic and atraumatic.  Eyes:     General: Lids are normal.     Conjunctiva/sclera: Conjunctivae normal.     Pupils: Pupils are equal, round, and reactive to light.  Neck:     Thyroid : No thyroid  mass.     Trachea: No tracheal deviation.  Cardiovascular:     Rate and Rhythm: Normal rate and regular rhythm.     Heart sounds: Normal heart sounds. No murmur heard.    No gallop.  Pulmonary:     Effort: Pulmonary effort is normal. No respiratory distress.     Breath sounds: Normal breath sounds. No stridor. No decreased breath sounds, wheezing, rhonchi or rales.  Abdominal:     General: There is no distension.     Palpations: Abdomen is soft.     Tenderness: There is no abdominal tenderness. There is no rebound.  Musculoskeletal:        General: No tenderness. Normal range of motion.     Cervical back: Normal range  of motion and neck supple.       Back:  Skin:    General: Skin is warm and dry.     Findings: No abrasion or rash.  Neurological:     Mental Status: He is alert and oriented to person, place, and time. Mental status is at baseline.     GCS: GCS eye subscore is 4. GCS verbal subscore is 5. GCS motor subscore is 6.     Cranial Nerves: No cranial nerve deficit.     Sensory: No sensory deficit.     Motor: Motor function is intact.  Psychiatric:        Attention and Perception: Attention normal.  Speech: Speech normal.        Behavior: Behavior normal.     (all labs ordered are listed, but only abnormal results are displayed) Labs Reviewed  URINALYSIS, ROUTINE W REFLEX MICROSCOPIC    EKG: None  Radiology: No results found.   Procedures   Medications Ordered in the ED  lidocaine  (LIDODERM ) 5 % 1 patch (has no administration in time range)  methocarbamol  (ROBAXIN ) tablet 500 mg (has no administration in time range)                                    Medical Decision Making Amount and/or Complexity of Data Reviewed Labs: ordered. Radiology: ordered.  Risk Prescription drug management.   Patient given Robaxin  and lidocaine  patch and feels much better at this time.  Suspect muscle skeletal etiology.  Patient's chest x-ray did not show any acute findings.  Will place on Lidoderm  patch as well as Robaxin  and he will follow-up in the cancer center next week     Final diagnoses:  None    ED Discharge Orders     None          Dasie Faden, MD 11/18/23 1441

## 2023-11-20 ENCOUNTER — Ambulatory Visit: Attending: Physician Assistant | Admitting: Physician Assistant

## 2023-11-20 ENCOUNTER — Encounter: Payer: Self-pay | Admitting: Physician Assistant

## 2023-11-20 VITALS — BP 122/74 | HR 84 | Ht 67.0 in | Wt 194.8 lb

## 2023-11-20 DIAGNOSIS — I10 Essential (primary) hypertension: Secondary | ICD-10-CM | POA: Insufficient documentation

## 2023-11-20 DIAGNOSIS — C3412 Malignant neoplasm of upper lobe, left bronchus or lung: Secondary | ICD-10-CM | POA: Diagnosis not present

## 2023-11-20 DIAGNOSIS — R0602 Shortness of breath: Secondary | ICD-10-CM | POA: Insufficient documentation

## 2023-11-20 DIAGNOSIS — I5032 Chronic diastolic (congestive) heart failure: Secondary | ICD-10-CM | POA: Insufficient documentation

## 2023-11-20 MED ORDER — HYDRALAZINE HCL 10 MG PO TABS: 10.0000 mg | ORAL_TABLET | Freq: Two times a day (BID) | ORAL | 3 refills | Status: AC

## 2023-11-20 NOTE — Assessment & Plan Note (Signed)
 EF 65 by echocardiogram June 2025.  He is NYHA IIb.  Recent chest x-ray in the emergency room did not demonstrate pulmonary edema.  On exam, volume status seems to be stable.  He does note worsening shortness of breath.  He is being referred to pulmonology.  He has multiple factors that are likely contributing to his shortness of breath including COPD, lung cancer, anemia, heart failure.  I have recommended that we obtain a BNP as well.  If this is significantly elevated, we will need to adjust his furosemide .  Otherwise, continue current therapy. - BMET BNP (to be drawn at the cancer center tomorrow) - Continue Lasix  40 mg daily - Could consider spironolactone  if blood pressure remains high and potassium is low - Follow-up 6 months

## 2023-11-20 NOTE — Patient Instructions (Signed)
 Medication Instructions:  RESTART Hydralazine  (Apresoline ) 10 mg, take one (1) tablet my mouth TWICE daily *If you need a refill on your cardiac medications before your next appointment, please call your pharmacy*  Lab Work: BNP and BMET (drawn from port)  If you have labs (blood work) drawn today and your tests are completely normal, you will receive your results only by: MyChart Message (if you have MyChart) OR A paper copy in the mail If you have any lab test that is abnormal or we need to change your treatment, we will call you to review the results.  Testing/Procedures: None ordered  Follow-Up: At [facility], you and your health needs are our priority.  As part of our continuing mission to provide you with exceptional heart care, our providers are all part of one team.  This team includes your primary Cardiologist (physician) and Advanced Practice Providers or APPs (Physician Assistants and Nurse Practitioners) who all work together to provide you with the care you need, when you need it.  Your next appointment:   6 month(s)  Provider:   Stanly DELENA Leavens, MD    We recommend signing up for the patient portal called MyChart.  Sign up information is provided on this After Visit Summary.  MyChart is used to connect with patients for Virtual Visits (Telemedicine).  Patients are able to view lab/test results, encounter notes, upcoming appointments, etc.  Non-urgent messages can be sent to your provider as well.   To learn more about what you can do with MyChart, go to [email or web address].

## 2023-11-20 NOTE — Assessment & Plan Note (Signed)
 He has completed radiation and chemotherapy. He has follow up with oncology tomorrow.

## 2023-11-20 NOTE — Progress Notes (Signed)
 OFFICE NOTE:    Date:  11/20/2023  ID:  Jodi LITTIE Monte, DOB 12-05-1950, MRN [id number] PCP: [name], MD  [facility] Providers Cardiologist:  Stanly DELENA Leavens, MD        HFpEF (heart failure with preserved EF) TTE 12/15/2019: EF 60-65, no RWMA, mild LVH, GRII DD, normal RVSF, mild RVE, normal PASP, severe LAE, moderate RAE, mild AV sclerosis without stenosis, RVSP 23.1  TTE 07/01/2023: EF 65, no RWMA, mild LVH, GR 2 DD, normal RVSF, mild RVE, severe LAE, AV sclerosis Chest pain  SPECT MPI 05/20/2021: EF 77, normal perfusion, low risk First-degree AV block Hypertension Hyperlipidemia NSC Lung CA  Radiation; Chemo: Carboplatin , Taxol  Onc: Dr. Sherrod Hx of CVA Hx of AKI (NSAIDs) MGUS  Chronic Obstructive Pulmonary Disease  Depression GERD Erectile dysfunction        Discussed the use of AI scribe software for clinical note transcription with the patient, who gave verbal consent to proceed. History of Present Illness Jaylan L Baris is a 73 y.o. male for follow up of CHF.  Last seen by Dr. Arnetha in June 2025.  Prior to that he had been admitted with symptomatic hypotension.  His candesartan, hydralazine , metoprolol  succinate and spironolactone  were stopped.  At his last visit furosemide  was resumed.  After his visit, his blood pressure increased and he was placed back on hydralazine . Since last seen, he has been dx w NSC Lung CA. He has undergone radiation and chemotherapy. He is followed by Dr. Sherrod.   He is here with his daughter.  He was admitted in August with hemoptysis.  His blood pressure was running low at that time and his hydralazine  was placed on hold.  His Plavix  was also placed on hold at that time.  He went to the emergency room recently with left scapular pain.  This is in the area that he received radiation.  It is worse with movement.  He notes worsening shortness of breath. No swelling or significant weight gain, with consistent weight  readings between 192 and 195 pounds. He experiences fatigue and lightheadedness, particularly in the mornings, which he attributes to his cancer treatment. He is trying to maintain hydration but finds it challenging to drink large amounts of water . He lives in an independent living facility and has dietary challenges due to limited dining options.  Blood pressures at home have been elevated.  His daughter showed me a list of his blood pressures over the past several weeks.  The majority of these were 130s-140s/90s    ROS-See HPI    Studies Reviewed:      Labs 11/14/2023: K 3.5, creatinine 1.32, eGFR 58, ALT 8, Hgb 10.7, PLT 256K         Physical Exam:  VS:  BP 122/74   Pulse 84   Ht 5' 7 (1.702 m)   Wt 194 lb 12.8 oz (88.4 kg)   SpO2 98%   BMI 30.51 kg/m        Wt Readings from Last 3 Encounters:  11/20/23 194 lb 12.8 oz (88.4 kg)  11/18/23 196 lb 3.4 oz (89 kg)  10/24/23 195 lb 4.8 oz (88.6 kg)    Constitutional:      Appearance: Healthy appearance. Not in distress.  Neck:     Vascular: No JVR.  Pulmonary:     Breath sounds: Normal breath sounds. No wheezing. No rales.  Cardiovascular:     Normal rate. Regular rhythm.     Murmurs: There  is no murmur.  Edema:    Peripheral edema present.    Ankle: bilateral trace edema of the ankle. Abdominal:     Palpations: Abdomen is soft.        Assessment and Plan:    Assessment & Plan Chronic heart failure with preserved ejection fraction (HCC) Shortness of breath EF 65 by echocardiogram June 2025.  He is NYHA IIb.  Recent chest x-ray in the emergency room did not demonstrate pulmonary edema.  On exam, volume status seems to be stable.  He does note worsening shortness of breath.  He is being referred to pulmonology.  He has multiple factors that are likely contributing to his shortness of breath including COPD, lung cancer, anemia, heart failure.  I have recommended that we obtain a BNP as well.  If this is significantly  elevated, we will need to adjust his furosemide .  Otherwise, continue current therapy. - BMET BNP (to be drawn at the cancer center tomorrow) - Continue Lasix  40 mg daily - Could consider spironolactone  if blood pressure remains high and potassium is low - Follow-up 6 months Essential hypertension Blood pressure has been above target at home.  Blood pressure machine is accurate.  He did have low blood pressures previously when he was admitted in August and his hydralazine  was stopped.  I have recommended we try low-dose hydralazine  again to see if we get his blood pressure to goal.  He was previously on 75 mg twice a day. - Continue amlodipine  10 mg daily - Start hydralazine  10 mg twice daily - Monitor blood pressure and send readings in 2 weeks - We discussed maintaining a low-salt diet Adenocarcinoma of upper lobe of left lung (HCC) He has completed radiation and chemotherapy. He has follow up with oncology tomorrow.         Dispo:  Return in about 6 months (around 05/19/2024) for Routine Follow Up, w/ Dr. Santo.  Signed, Glendia Ferrier, PA-C

## 2023-11-20 NOTE — Assessment & Plan Note (Signed)
 Blood pressure has been above target at home.  Blood pressure machine is accurate.  He did have low blood pressures previously when he was admitted in August and his hydralazine  was stopped.  I have recommended we try low-dose hydralazine  again to see if we get his blood pressure to goal.  He was previously on 75 mg twice a day. - Continue amlodipine  10 mg daily - Start hydralazine  10 mg twice daily - Monitor blood pressure and send readings in 2 weeks - We discussed maintaining a low-salt diet

## 2023-11-21 ENCOUNTER — Ambulatory Visit: Payer: Self-pay | Admitting: Physician Assistant

## 2023-11-21 ENCOUNTER — Inpatient Hospital Stay: Attending: Internal Medicine | Admitting: Internal Medicine

## 2023-11-21 ENCOUNTER — Ambulatory Visit: Admitting: Radiology

## 2023-11-21 ENCOUNTER — Inpatient Hospital Stay

## 2023-11-21 VITALS — BP 133/92 | HR 81 | Temp 98.0°F | Resp 17 | Ht 67.0 in | Wt 200.0 lb

## 2023-11-21 DIAGNOSIS — R5383 Other fatigue: Secondary | ICD-10-CM | POA: Insufficient documentation

## 2023-11-21 DIAGNOSIS — R530 Neoplastic (malignant) related fatigue: Secondary | ICD-10-CM | POA: Diagnosis not present

## 2023-11-21 DIAGNOSIS — Z5112 Encounter for antineoplastic immunotherapy: Secondary | ICD-10-CM | POA: Insufficient documentation

## 2023-11-21 DIAGNOSIS — C3412 Malignant neoplasm of upper lobe, left bronchus or lung: Secondary | ICD-10-CM | POA: Diagnosis not present

## 2023-11-21 DIAGNOSIS — Z7902 Long term (current) use of antithrombotics/antiplatelets: Secondary | ICD-10-CM | POA: Insufficient documentation

## 2023-11-21 DIAGNOSIS — R590 Localized enlarged lymph nodes: Secondary | ICD-10-CM | POA: Insufficient documentation

## 2023-11-21 DIAGNOSIS — I130 Hypertensive heart and chronic kidney disease with heart failure and stage 1 through stage 4 chronic kidney disease, or unspecified chronic kidney disease: Secondary | ICD-10-CM | POA: Diagnosis not present

## 2023-11-21 DIAGNOSIS — I5032 Chronic diastolic (congestive) heart failure: Secondary | ICD-10-CM | POA: Insufficient documentation

## 2023-11-21 DIAGNOSIS — M47814 Spondylosis without myelopathy or radiculopathy, thoracic region: Secondary | ICD-10-CM | POA: Diagnosis not present

## 2023-11-21 DIAGNOSIS — I700 Atherosclerosis of aorta: Secondary | ICD-10-CM | POA: Diagnosis not present

## 2023-11-21 DIAGNOSIS — K449 Diaphragmatic hernia without obstruction or gangrene: Secondary | ICD-10-CM | POA: Diagnosis not present

## 2023-11-21 DIAGNOSIS — R197 Diarrhea, unspecified: Secondary | ICD-10-CM | POA: Insufficient documentation

## 2023-11-21 DIAGNOSIS — Z79899 Other long term (current) drug therapy: Secondary | ICD-10-CM | POA: Diagnosis not present

## 2023-11-21 DIAGNOSIS — K219 Gastro-esophageal reflux disease without esophagitis: Secondary | ICD-10-CM | POA: Insufficient documentation

## 2023-11-21 DIAGNOSIS — D472 Monoclonal gammopathy: Secondary | ICD-10-CM | POA: Diagnosis not present

## 2023-11-21 DIAGNOSIS — K580 Irritable bowel syndrome with diarrhea: Secondary | ICD-10-CM | POA: Insufficient documentation

## 2023-11-21 DIAGNOSIS — D508 Other iron deficiency anemias: Secondary | ICD-10-CM

## 2023-11-21 DIAGNOSIS — R0602 Shortness of breath: Secondary | ICD-10-CM

## 2023-11-21 DIAGNOSIS — Z79624 Long term (current) use of inhibitors of nucleotide synthesis: Secondary | ICD-10-CM | POA: Diagnosis not present

## 2023-11-21 DIAGNOSIS — Z923 Personal history of irradiation: Secondary | ICD-10-CM | POA: Diagnosis not present

## 2023-11-21 DIAGNOSIS — K589 Irritable bowel syndrome without diarrhea: Secondary | ICD-10-CM | POA: Diagnosis not present

## 2023-11-21 DIAGNOSIS — Z8673 Personal history of transient ischemic attack (TIA), and cerebral infarction without residual deficits: Secondary | ICD-10-CM | POA: Diagnosis not present

## 2023-11-21 DIAGNOSIS — E785 Hyperlipidemia, unspecified: Secondary | ICD-10-CM | POA: Diagnosis not present

## 2023-11-21 DIAGNOSIS — Z9221 Personal history of antineoplastic chemotherapy: Secondary | ICD-10-CM | POA: Insufficient documentation

## 2023-11-21 LAB — BASIC METABOLIC PANEL WITH GFR
Anion gap: 7 (ref 5–15)
BUN: 9 mg/dL (ref 8–23)
CO2: 28 mmol/L (ref 22–32)
Calcium: 8.9 mg/dL (ref 8.9–10.3)
Chloride: 103 mmol/L (ref 98–111)
Creatinine, Ser: 1.27 mg/dL — ABNORMAL HIGH (ref 0.61–1.24)
GFR, Estimated: >60 mL/min (ref >60)
Glucose, Bld: 98 mg/dL (ref 70–99)
Potassium: 3.7 mmol/L (ref 3.5–5.1)
Sodium: 138 mmol/L (ref 135–145)

## 2023-11-21 LAB — BRAIN NATRIURETIC PEPTIDE: B Natriuretic Peptide: 4.7 pg/mL (ref 0.0–100.0)

## 2023-11-21 MED ORDER — SODIUM CHLORIDE 0.9% FLUSH
3.0000 mL | Freq: Once | INTRAVENOUS | Status: AC | PRN
  Administered 2023-11-21: 3 mL

## 2023-11-21 NOTE — Progress Notes (Signed)
 [facility] [phone]   [phone]  OFFICE PROGRESS NOTE  [name], MD [address] [place] [address]  DIAGNOSIS:  Stage IIIA (T3, N1, M0) non-small cell lung cancer, adenocarcinoma presented with large left upper lobe lung mass in addition to left hilar lymphadenopathy diagnosed in August 2025.    Molecular Studies:  Biomarker Findings Tumor Mutational Burden - 13 Muts/Mb HRD signature - HRDsig Negative Microsatellite status - MS-Equivocal ? Genomic Findings For a complete list of the genes assayed, please refer to the Appendix. MET exon 14 splice site ([id number]>[id number]), amplification CDK4 amplification MDM2 amplification PIK3CA [id number], [id number]   PDL1: 99%   PRIOR THERAPY: None   CURRENT THERAPY:  1) Concurrent chemoradiation with carboplatin  for an AUC of 2 and taxol  45 mg/m2. First dose on 09/19/23. Status post 5 cycles.  Last dose was given on 10/16/2023. 2) Fereheme 510 mg weekly x2. First dose on 09/26/23.   INTERVAL HISTORY: [name] 73 y.o. male returns to the clinic today for follow-up visit accompanied by his daughter [name]. Discussed the use of AI scribe software for clinical note transcription with the patient, who gave verbal consent to proceed.  History of Present Illness [name] is a 72 year old male with stage three non-small cell lung cancer who presents for evaluation with repeat CT scan of the chest for restaging of his disease. He is accompanied by his daughter, [name].  He has stage three non-small cell lung cancer with a positive MET exon 14 splice mutation and PD-L1 expression of 99%. He completed a course of concurrent chemoradiation with weekly carboplatin  and paclitaxel , with the last dose administered on October 16, 2023.  He experiences significant fatigue, feeling 'wiped out' and needing to lie down, which has persisted since his last treatment five weeks ago. No pain medication is taken, but  Tylenol  is used as needed. He does not engage in regular exercise, although he goes out occasionally, and experiences significant fatigue when attempting to walk.  He has a history of IBS-D, which contributes to diarrhea, and he is currently on probiotics to manage this condition. His daughter emphasizes the importance of dietary habits in managing his symptoms.  He recently visited a cardiologist who wanted to assess fluid retention and its impact on his breathing and blood pressure. His hemoglobin is 10.7, white blood count is 3.2, and platelets are within normal range.    MEDICAL HISTORY: Past Medical History:  Diagnosis Date   (HFpEF) heart failure with preserved ejection fraction (HCC)    Allergy    Anxiety    Arthritis    Chronic kidney disease    Depression    Frequent urination    GERD (gastroesophageal reflux disease)    Gout    Headache    mirgaines in the past   Herpes    History of nuclear stress test    Myoview  05/2021: EF 77, normal perfusion, low risk   Hyperlipidemia    Hypertension    IBS (irritable bowel syndrome)    Memory loss    MGUS (monoclonal gammopathy of unknown significance)    Obesity    Pre-diabetes    Stroke (HCC)    Urgency of urination     ALLERGIES:  is allergic to divalproex sodium, lamotrigine, valproic acid, diphenhydramine , amitriptyline, aspirin , hydrocodone , lisinopril, lithium, morphine , nsaids, oxycodone, oxycodone-acetaminophen , and pork allergy.  MEDICATIONS:  Current Outpatient Medications  Medication Sig Dispense Refill   acetaminophen  (TYLENOL )  500 MG tablet Take 1-2 tablets (500-1,000 mg total) by mouth every 6 (six) hours as needed for moderate pain. 30 tablet 0   allopurinol  (ZYLOPRIM ) 100 MG tablet Take 150 mg by mouth daily.     amLODipine  (NORVASC ) 10 MG tablet Take 1 tablet (10 mg total) by mouth daily. 90 tablet 3   benzonatate  (TESSALON ) 200 MG capsule Take 1 capsule (200 mg total) by mouth in the morning and at  bedtime. 60 capsule 2   busPIRone  (BUSPAR ) 30 MG tablet Take 30 mg by mouth 2 (two) times daily.     cetirizine  (ZYRTEC  ALLERGY) 10 MG tablet Take 1 tablet (10 mg total) by mouth daily. 30 tablet 0   clindamycin (CLEOCIN) 300 MG capsule Take 300 mg by mouth 3 (three) times daily.     [Paused] clopidogrel  (PLAVIX ) 75 MG tablet Take 1 tablet (75 mg total) by mouth daily. Okay to restart this medication on 08/22/2023.     dextromethorphan  (DELSYM ) 30 MG/5ML liquid Take 5 mLs (30 mg total) by mouth 2 (two) times daily as needed for cough. (Patient taking differently: Take by mouth 2 (two) times daily as needed for cough. Pt states he takes it every 12 hours ATC) 89 mL 0   ferrous sulfate  325 (65 FE) MG EC tablet Take 1 tablet (325 mg total) by mouth daily with breakfast. 30 tablet 3   fluticasone  (FLONASE ) 50 MCG/ACT nasal spray Place 1 spray into both nostrils daily. 16 g 2   folic acid  (FOLVITE ) 1 MG tablet Take 1 mg by mouth daily.     furosemide  (LASIX ) 40 MG tablet Take 1 tablet (40 mg total) by mouth daily. 90 tablet 3   hydrALAZINE  (APRESOLINE ) 10 MG tablet Take 1 tablet (10 mg total) by mouth 2 (two) times daily. 180 tablet 3   HYDROcodone  bit-homatropine (HYCODAN) 5-1.5 MG/5ML syrup Take 5 mLs by mouth every 4 (four) hours as needed (Persistent cough). 120 mL 0   Hydroxychloroquine  Sulfate 400 MG TABS Take 400 mg by mouth at bedtime.     ketoconazole (NIZORAL) 2 % cream Apply 1 Application topically daily.     Lactobacillus (ACIDOPHILUS) CAPS capsule Take 1 capsule by mouth daily. 100 capsule 0   lidocaine  (LIDODERM ) 5 % Place 1 patch onto the skin daily as needed (for pain- Remove & Discard patch within 12 hours or as directed by MD).     lidocaine  (XYLOCAINE ) 5 % ointment Apply 1 Application topically as needed. 35.44 g 0   loperamide (IMODIUM) 2 MG capsule 2 mg as needed.     methocarbamol  (ROBAXIN ) 500 MG tablet Take 1 tablet (500 mg total) by mouth 2 (two) times daily. 20 tablet 0    Multiple Vitamin (MULTIVITAMIN) TABS TAKE 1 TABLET BY MOUTH DAILY. 27 tablet 1   Multiple Vitamins-Minerals (MULTIVITAMIN) tablet Take 1 tablet by mouth daily. 30 tablet 1   PARoxetine  (PAXIL -CR) 37.5 MG 24 hr tablet Take 1 tablet (37.5 mg total) by mouth at bedtime.     primidone  (MYSOLINE ) 250 MG tablet Take 1 tablet (250 mg total) by mouth 2 (two) times daily. 60 tablet 2   QUEtiapine  (SEROQUEL ) 25 MG tablet Take 25 mg by mouth at bedtime.     rosuvastatin  (CRESTOR ) 40 MG tablet Take 40 mg by mouth daily.     traZODone  (DESYREL ) 100 MG tablet Take 100 mg by mouth at bedtime.     valACYclovir  (VALTREX ) 1000 MG tablet Take 1,000 mg by mouth daily.  Vitamin D , Ergocalciferol , (DRISDOL ) 1.25 MG (50000 UNIT) CAPS capsule Take 50,000 Units by mouth every Saturday.     No current facility-administered medications for this visit.    SURGICAL HISTORY:  Past Surgical History:  Procedure Laterality Date   CHOLECYSTECTOMY N/A 12/17/2019   Procedure: LAPAROSCOPIC CHOLECYSTECTOMY WITH ICG INJECTION;  Surgeon: Ebbie Cough, MD;  Location: [facility] OR;  Service: General;  Laterality: N/A;   disckec     IR IMAGING GUIDED PORT INSERTION  09/12/2023   shouler arthroscopy Left    SPINAL FUSION     TONSILLECTOMY     TOTAL KNEE ARTHROPLASTY Right 11/15/2021   Procedure: TOTAL KNEE ARTHROPLASTY;  Surgeon: Melodi Lerner, MD;  Location: [facility] ORS;  Service: Orthopedics;  Laterality: Right;   VIDEO BRONCHOSCOPY WITH ENDOBRONCHIAL NAVIGATION Left 08/21/2023   Procedure: VIDEO BRONCHOSCOPY WITH ENDOBRONCHIAL NAVIGATION;  Surgeon: Shelah Lamar RAMAN, MD;  Location: [facility] ENDOSCOPY;  Service: Pulmonary;  Laterality: Left;   VIDEO BRONCHOSCOPY WITH ENDOBRONCHIAL ULTRASOUND  08/21/2023   Procedure: BRONCHOSCOPY, WITH EBUS;  Surgeon: Shelah Lamar RAMAN, MD;  Location: [facility] ENDOSCOPY;  Service: Pulmonary;;   VIDEO BRONCHOSCOPY WITH RADIAL ENDOBRONCHIAL ULTRASOUND  08/21/2023   Procedure: VIDEO BRONCHOSCOPY WITH RADIAL ENDOBRONCHIAL  ULTRASOUND;  Surgeon: Shelah Lamar RAMAN, MD;  Location: [facility] ENDOSCOPY;  Service: Pulmonary;;   WISDOM TOOTH EXTRACTION      REVIEW OF SYSTEMS:  Constitutional: positive for fatigue Eyes: negative Ears, nose, mouth, throat, and face: negative Respiratory: positive for cough Cardiovascular: negative Gastrointestinal: positive for nausea Genitourinary:negative Integument/breast: negative Hematologic/lymphatic: negative Musculoskeletal:negative Neurological: negative Behavioral/Psych: negative Endocrine: negative Allergic/Immunologic: negative   PHYSICAL EXAMINATION: General appearance: alert, cooperative, fatigued, and no distress Head: Normocephalic, without obvious abnormality, atraumatic Neck: no adenopathy, no JVD, supple, symmetrical, trachea midline, and thyroid  not enlarged, symmetric, no tenderness/mass/nodules Lymph nodes: Cervical, supraclavicular, and axillary nodes normal. Resp: clear to auscultation bilaterally Back: symmetric, no curvature. ROM normal. No CVA tenderness. Cardio: regular rate and rhythm, S1, S2 normal, no murmur, click, rub or gallop GI: soft, non-tender; bowel sounds normal; no masses,  no organomegaly Extremities: extremities normal, atraumatic, no cyanosis or edema Neurologic: Alert and oriented X 3, normal strength and tone. Normal symmetric reflexes. Normal coordination and gait  ECOG PERFORMANCE STATUS: 1 - Symptomatic but completely ambulatory  Blood pressure (!) 133/92, pulse 81, temperature 98 F (36.7 C), temperature source Temporal, resp. rate 17, height 5' 7 (1.702 m), weight 200 lb (90.7 kg), SpO2 99%.  LABORATORY DATA: Lab Results  Component Value Date   WBC 3.2 (L) 11/14/2023   HGB 10.7 (L) 11/14/2023   HCT 30.4 (L) 11/14/2023   MCV 90.5 11/14/2023   PLT 256 11/14/2023      Chemistry      Component Value Date/Time   NA 137 11/14/2023 1042   NA 140 07/27/2023 1454   K 3.5 11/14/2023 1042   CL 103 11/14/2023 1042   CO2 28  11/14/2023 1042   BUN 8 11/14/2023 1042   BUN 15 07/27/2023 1454   CREATININE 1.32 (H) 11/14/2023 1042      Component Value Date/Time   CALCIUM  8.8 (L) 11/14/2023 1042   ALKPHOS 73 11/14/2023 1042   AST 15 11/14/2023 1042   ALT 8 11/14/2023 1042   BILITOT 0.3 11/14/2023 1042       RADIOGRAPHIC STUDIES: DG Chest 2 View Result Date: 11/18/2023 CLINICAL DATA:  Left chest and shoulder pain.  Left lung carcinoma. EXAM: CHEST - 2 VIEW COMPARISON:  Chest CT on 11/14/2023 FINDINGS: The heart  size and mediastinal contours are within normal limits. Right-sided power port is seen in appropriate position. Left upper lobe opacity in volume loss shows no significant change compared to recent CT, consistent with left upper lobe mass and associated post treatment changes. No new areas of pulmonary opacity are seen. No pneumothorax or pleural effusion. Thoracic spine degenerative changes again noted IMPRESSION: Stable left upper lobe opacity and volume loss, consistent with known left upper lobe mass and associated post treatment changes. No acute findings. Electronically Signed   By: Norleen DELENA Kil M.D.   On: 11/18/2023 14:00   CT Chest W Contrast Result Date: 11/16/2023 CLINICAL DATA:  Left upper lobe adenocarcinoma and left hilar lymphadenopathy status post chemoradiation completed 10/16/2023. Assess treatment response. * Tracking Code: BO * EXAM: CT CHEST WITH CONTRAST TECHNIQUE: Multidetector CT imaging of the chest was performed during intravenous contrast administration. RADIATION DOSE REDUCTION: This exam was performed according to the departmental dose-optimization program which includes automated exposure control, adjustment of the mA and/or kV according to patient size and/or use of iterative reconstruction technique. CONTRAST:  75mL OMNIPAQUE  IOHEXOL  300 MG/ML  SOLN COMPARISON:  CTA chest dated 08/27/2023 and multiple priors FINDINGS: Cardiovascular: Right chest wall port terminates in the lower SVC.  Normal heart size. No significant pericardial fluid/thickening. Great vessels are normal in course and caliber. No central pulmonary emboli. Aortic atherosclerosis. Mediastinum/Nodes: Imaged thyroid  gland without nodules meeting criteria for imaging follow-up by size. Small hiatal hernia. No pathologically enlarged axillary, supraclavicular, mediastinal, or hilar lymph nodes. Lungs/Pleura: The central airways are patent. Interval decreased size of left upper lobe mass measuring 4.4 x 4.2 cm (7:39), previously 5.6 x 5.0 cm, which continues to extend into the mediastinum, where there is increased volume loss and architectural distortion. Additional areas of volume loss and platelike consolidation is also seen along the lateral aspect of the mass with adjacent ground-glass opacity (7:33). New ground-glass opacities within the anterior left upper lobe and lingula and perifissural right lower lobe measuring 9 x 5 mm (7:56). Unchanged peripheral right lower lobe ground-glass nodule measuring 1.4 x 1.3 cm (remeasured). Recanalization of superior segment left lower lobe bronchus. no pneumothorax. No pleural effusion. Upper abdomen: Unchanged hepatic hypodensities, many of which are too small to characterize. The largest in segment 2/3 remains consistent with a cyst. Cholecystectomy. Bilateral renal hypodensities, likely cysts. Musculoskeletal: No acute or abnormal lytic or blastic osseous lesions. Multilevel degenerative changes of the thoracic spine. IMPRESSION: 1. Interval decreased size of left upper lobe mass with increased volume loss and architectural distortion, consistent with treatment response. 2. New ground-glass opacities within the anterior and lateral left upper lobe, lingula, perifissural right lower lobe, indeterminate, may reflect postradiation change or superimposed infection/inflammation. 3. Unchanged peripheral right lower lobe ground-glass nodule. 4.  Aortic Atherosclerosis (ICD10-I70.0). Electronically  Signed   By: Limin  Xu M.D.   On: 11/16/2023 14:55     ASSESSMENT AND PLAN: This is a 73 years old African-American male with Stage IIIA (T3, N1, M0) non-small cell lung cancer, adenocarcinoma presented with large left upper lobe lung mass in addition to left hilar lymphadenopathy diagnosed in August 2025.  Molecular study showed positive met exon 14 splice mutation and PD-L1 expression of 99%. He underwent a course of concurrent chemoradiation with weekly carboplatin  for AUC of 2 and paclitaxel  45 mg/M2.  Status post 5 cycles.  Last dose of treatment was given on 09/26/2023.  He tolerated the treatment well except for the increasing fatigue and cough. He had repeat  CT scan of the chest performed recently.  I personally independently reviewed the scan images and discussed the result and showed the images to the patient and his daughter. His scan showed partial response to his treatment. Assessment and Plan Assessment & Plan Stage III non-small cell lung cancer with MET exon 14 mutation, post-chemoradiation, for restaging and initiation of immunotherapy Stage III non-small cell lung cancer with MET exon 14 mutation, post-chemoradiation. Recent CT scan shows a 25% reduction in tumor size, but the tumor remains close to the aorta, making surgical resection not an option. Immunotherapy is recommended as the next step in treatment. Discussed the benefits of immunotherapy, including a 42% five-year survival rate compared to 29% without immunotherapy. Explained potential side effects, including inflammation in various organs, with a small percentage risk of severe side effects. He expressed understanding and agreed to proceed with immunotherapy. - Will initiate immunotherapy every four weeks for 13 cycles. - Provided information on immunotherapy and its side effects. - Monitor for side effects and manage as needed.  Cancer-related fatigue Persistent fatigue following chemoradiation. Discussed the importance  of exercise in managing fatigue. Encouraged gradual increase in physical activity to improve energy levels. - Encouraged regular exercise, starting with short walks and gradually increasing activity.  Chronic diarrhea associated with irritable bowel syndrome Chronic diarrhea associated with irritable bowel syndrome. Discussed potential exacerbation of diarrhea with immunotherapy due to inflammation in the colon. Advised on dietary modifications to manage symptoms and the use of Imodium for mild diarrhea. - Continue probiotics. - Advised dietary modifications to avoid exacerbating diarrhea. - Use Imodium for mild diarrhea as needed. The patient was advised to call immediately if he has any other concerning symptoms in the interval.  The patient voices understanding of current disease status and treatment options and is in agreement with the current care plan.  All questions were answered. The patient knows to call the clinic with any problems, questions or concerns. We can certainly see the patient much sooner if necessary. The total time spent in the appointment was 55 minutes including review of chart and various tests results, discussions about plan of care and coordination of care plan .   Disclaimer: This note was dictated with voice recognition software. Similar sounding words can inadvertently be transcribed and may not be corrected upon review.

## 2023-11-21 NOTE — Progress Notes (Signed)
 DISCONTINUE ON PATHWAY REGIMEN - Non-Small Cell Lung     A cycle is every 7 days, concurrent with RT:     Paclitaxel       Carboplatin    **Always confirm dose/schedule in your pharmacy ordering system**  REASON: Continuation Of Treatment PRIOR TREATMENT: [id number]: Carboplatin  AUC=2 + Paclitaxel  45 mg/m2 Weekly During Radiation TREATMENT RESPONSE: Partial Response (PR)  START ON PATHWAY REGIMEN - Non-Small Cell Lung     A cycle is every 28 days:     Durvalumab   **Always confirm dose/schedule in your pharmacy ordering system**  Patient Characteristics: Preoperative or Nonsurgical Candidate (Clinical Staging), Stage IIB (N2a only) or Stage III - Nonsurgical Candidate, PS = 0,1 Therapeutic Status: Preoperative or Nonsurgical Candidate (Clinical Staging) AJCC T Category: cT3 AJCC N Category: cN1 AJCC M Category: cM0 AJCC 9 Stage Grouping: IIIA Check here if patient was staged using an edition other than AJCC Staging 9th Edition: true ECOG Performance Status: 1 Intent of Therapy: Curative Intent, Discussed with Patient

## 2023-11-22 ENCOUNTER — Other Ambulatory Visit: Payer: Self-pay

## 2023-11-23 ENCOUNTER — Encounter: Payer: Self-pay | Admitting: Internal Medicine

## 2023-11-24 ENCOUNTER — Other Ambulatory Visit: Payer: Self-pay

## 2023-11-28 ENCOUNTER — Encounter: Payer: Self-pay | Admitting: Neurology

## 2023-11-28 ENCOUNTER — Ambulatory Visit: Admitting: Neurology

## 2023-11-28 VITALS — BP 128/83 | HR 102 | Ht 67.0 in | Wt 199.8 lb

## 2023-11-28 DIAGNOSIS — G3184 Mild cognitive impairment, so stated: Secondary | ICD-10-CM

## 2023-11-28 DIAGNOSIS — M109 Gout, unspecified: Secondary | ICD-10-CM | POA: Insufficient documentation

## 2023-11-28 DIAGNOSIS — G250 Essential tremor: Secondary | ICD-10-CM | POA: Diagnosis not present

## 2023-11-28 DIAGNOSIS — Z8673 Personal history of transient ischemic attack (TIA), and cerebral infarction without residual deficits: Secondary | ICD-10-CM

## 2023-11-28 DIAGNOSIS — M25519 Pain in unspecified shoulder: Secondary | ICD-10-CM | POA: Insufficient documentation

## 2023-11-28 NOTE — Patient Instructions (Addendum)
 I had a long discussion with the patient and  his daughter regarding his  ,remote stroke, mild cognitive impairment, essential tremors all of which appears quite stable.  He has been diagnosed with lung cancer recently and finished his chemotherapy and radiation and will start immunotherapy soon.  He is doing well with neurovascular standpoint without recurrent stroke or TIA symptoms.  I recommend he resume Plavix  for stroke prevention but if he has recurrent hemoptysis he may stop it and he is quite reluctant to try aspirin  as he believes he is allergic to it in the past.  Continue with aggressive risk factor modification strict control of hypertension blood pressure goal below 130/90 and lipids with LDL cholesterol goal below 70 mg percent.  Continue Mysoline  250 mg twice daily for his essential tremor which appears to be quite well controlled and is not functionally disabling.  He also has mild memory difficulties due to age-related mild cognitive impairment.  I recommend increase participation in cognitively challenging activities like solving crossword puzzles, playing bridge and sudoku.  We also discussed memory compensation strategies.  Return for follow-up in the future in 1 year or call earlier if necessary.  Memory Compensation Strategies  Use WARM strategy.  W= write it down  A= associate it  R= repeat it  M= make a mental note  2.   You can keep a Glass Blower/designer.  Use a 3-ring notebook with sections for the following: calendar, important names and phone numbers,  medications, doctors' names/phone numbers, lists/reminders, and a section to journal what you did  each day.   3.    Use a calendar to write appointments down.  4.    Write yourself a schedule for the day.  This can be placed on the calendar or in a separate section of the Memory Notebook.  Keeping a  regular schedule can help memory.  5.    Use medication organizer with sections for each day or morning/evening pills.  You  may need help loading it  6.    Keep a basket, or pegboard by the door.  Place items that you need to take out with you in the basket or on the pegboard.  You may also want to  include a message board for reminders.  7.    Use sticky notes.  Place sticky notes with reminders in a place where the task is performed.  For example:  turn off the  stove placed by the stove, lock the door placed on the door at eye level,  take your medications on  the bathroom mirror or by the place where you normally take your medications.  8.    Use alarms/timers.  Use while cooking to remind yourself to check on food or as a reminder to take your medicine, or as a  reminder to make a call, or as a reminder to perform another task, etc.

## 2023-11-28 NOTE — Progress Notes (Signed)
 [facility] [address] [place]. [place] [address] [phone]       OFFICE FOLLOW UP VISIT NOTE  [name] Date of Birth:  01-20-1950 Medical Record Number:  [id number]   Referring MD: Garnette Senters  Reason for Referral: Headaches  HPI: Initial visit 06/04/2020 [name] is a 73 year old African-American male seen today for consultation visit for headaches.  History is obtained from the patient and review of referral notes and I personally reviewed electronic medical records and pertinent imaging films in PACS.  He has past medical history of hypertension, hyperlipidemia, obesity, right brain subcortical infarct in 2011.  He states over 4 months ago he developed new onset of headaches which were mostly nocturnal.  He would wake up from sleep with a severe headache which he describes as bitemporal and spreading into the retro-orbital regions severe 10/10 in intensity with burning and at times throbbing quality.  There was accompanying nausea and he occasionally vomited.  He denied any light or sound sensitivity.  Headache was not relieved by sleeping as he could not sleep.  It would last for hours.  The headache initially occurred on a daily basis for several weeks.  He saw his primary physician who gave him Tylenol  with codeine  which did not work.  Finally he saw [name] who prescribed gabapentin on 04/03/2020 and he has been taking it 1 tablet 3 times daily and the headaches seem to have gone within a few days of starting it.  He however seems to have gained weight particularly on his belly and wants to lose it.  He denied any accompanying symptoms in the form of blurred vision, loss of vision, jaw claudication, myalgias or scalp tenderness.  He does have remote history of occasional migraines in his 25s when he had accompanying light and sound sensitivity as well.  He felt the current headaches were different.  The patient has recently gained weight he does  snore but he has never been tested for sleep apnea.  Patient has lifelong history of mild action tremor tremor involving mostly left upper extremity felt to be related to anxiety which appears stable.  He history of right brain subcortical infarct due to small vessel disease in December 2011 with only minor residual deficits on the left.  He states he has been on Plavix  ever since and not missed doses no recurrent stroke or TIA symptoms for more than 10 years.  He states his blood pressure and cholesterol has been under good control but he cannot tell me when the last time it was checked.  He was last seen in our office for follow-up by [name] on 01/09/2020. Update 05/17/2021 ; he returns for follow-up after his initial visit with me nearly a year ago.  His daughter also participated in this visit via telephone.  Patient plans to undergo elective right knee surgery by Dr. Hiram next Monday and is here for neurological clearance.  Patient states he is doing well from neurological standpoint.  He has no recurrent TIA stroke symptoms since 2011.  He remains on Plavix  which is tolerating well without bruising or bleeding.  He states his blood pressure is under good control.  He remains on Crestor  which is tolerating well without muscle aches and pains.  His last lab work on 06/04/2020 had shown LDL cholesterol of 85 mg percent and hemoglobin A1c 5.9.  ESR was 2 mm.  I had also ordered an MRI scan of the brain  at that time because you complained of new onset headaches and MRI had shown only changes of nonspecific white matter disease.  No acute abnormality.  Patient states his headaches have gone cannot remember the last time he had a headache.  He remains on gabapentin 400 twice daily which had caused some weight gain and I suggested he taper and stop it at last visit but he has not done that.  I also referred him for evaluation for sleep apnea and he did see [name] who recommended home sleep  study but for unclear reason that has not been done either.  He also has a essential tremor for which she takes primidone  250 twice daily and is tolerating well without significant side effects and states tremor is well controlled and not functionally disabling.  He is living in independent living but does complain of mild short-term memory difficulties.  His daughter does help arrange his medications for him.  He has been driving.  He uses GPS.  He is still independent in most activities of daily living.  He has no other complaints. Update 05/18/2022 : He returns for follow-up after last visit a year ago.  He is accompanied by his daughter.  He states he is doing well.  Has not had any headaches.  He has not had recurrent stroke or TIA symptoms for more than 10 years.  He remains on Plavix  which is tolerating well with only minor bruising and no bleeding.  He states his blood pressure is under good control.  He is tolerating Crestor  well without muscle aches and pains.  Continues to have mild tremor in his hands but is not functionally disabling.  He is tolerating Mysoline  250 mg twice daily quite well without any side effects.  Continues to have mild short-term memory and cognitive difficulties.  He is now living in independent living.  His medications are laid out by his daughter the week.  He does not do significant daily standing activities does not go out a lot.  He had right knee replacement surgery done in October and has recovered well from that.  He had a MoCA done on 12/07/2021 and scored 23/30 at [facility]. Update 09/21/2022 : He returns for follow-up after last visit 4 months ago.  His daughter was present over the phone during this visit.  Patient was recently seen in ER on 05/18/2022 prompting this visit with me.  Patient states that he woke up on that day in the morning feeling dizzy and lightheaded.  He called his brother at 27 5 in the morning stating that he was feeling fine.   Patient went to take a shower and suddenly became dizzy and lightheaded.  He felt unsteady on his feet.  He denied any headache, blurred vision, double vision or vertigo.  He mated to the bedroom and lay on the bed.  He called his brother again and his ex-wife canceling his plans of going out for the day.  His brother convinced him to go to the emergency room for evaluation.  Patient denied any bleeding or severe diarrhea or dehydration and stated he had taken his medications as prescribed.  His blood pressure was 129/86 there is unclear if orthostatic vital signs were checked.  Hematocrit was stable at 34.  CMP was significant only for mildly elevated creatinine of 1.5.  CBC was normal.  MRI scan of the brain was obtained which showed no acute abnormality only changes of chronic small vessel disease.  MRI of the brain was motion degraded did not show any large vessel stenosis.  Patient was discharged home and stated that his lightheadedness went away after 4 to 6 hours.  He has not had any recurrent lightheadedness or dizziness since then.  Patient continues to have mild short-term memory and cognitive difficulties which appear to be stable and unchanged.  He has not been doing any mentally challenging activities which I had instructed at last visit.  He does get a little disoriented at times and today had trouble of coming to my office and had to use a GPS.  Continues to have mild upper extremity action tremors which are unchanged and seem to be well-controlled on the current medication regimen of Mysoline .  He remains on multiple medications for blood pressure which has been running on the low side and he plans to discuss this with his primary care physician whether he can come off some of these medicines. Update 04/06/2023 : He returns for follow-up after last visit 6 months ago.  He states he is doing well.  His tremors are quite mild and not interfering with activities of daily living.  He remains on Mysoline   to 50 mg twice daily which is tolerating well without any side effects.  He states his memory and cognitive difficulties are unchanged.  He in fact did quite well today and scored 29/30 on Mini-Mental status exam today which is improved over last visit.  He does participate in some activities like solving crossword puzzles.  He was recently diagnosed with gout and started on allopurinol .  He was also started on hydrochloric cream for rheumatoid arthritis.  He has no complaints today. Update 11/28/2023 : He returns for follow-up after last visit with me and 1/2 months ago.  He is accompanied by his daughter.  Patient has unfortunately been diagnosed with lung cancer and has just finished his course of chemotherapy and radiation.  He plans to start immunotherapy next week.  He sees Dr. Cleta in oncology.  Patient continues to live independently but is now living in independent living.  Daughter helps him a lot.  Continues to have mild short-term memory difficulties which may have gotten slightly worse since the chemotherapy.  He needs help with his medications but is otherwise mostly independent.  Continues to have mild action tremor in his hands but it seems well-controlled on the current medication regimen of Mysoline  to 50 mg twice daily.  Patient had to discontinue Plavix  as he developed hemoptysis on his lung cancer and he has not restarted it back.  He claims he is allergic to aspirin  years ago it made him sick and he is refusing to go back on it.  He has not had any recurrent stroke or TIA symptoms for more than 10 years.  He has no other complaints. ROS:   14 system review of systems is positive for tremor, memory loss, hemoptysis, snoring and all other systems negative  PMH:  Past Medical History:  Diagnosis Date   (HFpEF) heart failure with preserved ejection fraction (HCC)    Allergy    Anxiety    Arthritis    Chronic kidney disease    Depression    Frequent urination    GERD  (gastroesophageal reflux disease)    Gout    Headache    mirgaines in the past   Herpes    History of nuclear stress test    Myoview  05/2021: EF 77, normal perfusion, low risk   Hyperlipidemia  Hypertension    IBS (irritable bowel syndrome)    Memory loss    MGUS (monoclonal gammopathy of unknown significance)    Obesity    Pre-diabetes    Stroke [facility])    Urgency of urination     Social History:  Social History   Socioeconomic History   Marital status: Divorced    Spouse name: Not on file   Number of children: 1   Years of education: Masters   Highest education level: Not on file  Occupational History   Occupation: retired    Associate Professor: RETIRED  Tobacco Use   Smoking status: Former    Current packs/day: 0.00    Types: Cigarettes    Quit date: 1990    Years since quitting: 35.8   Smokeless tobacco: Never  Vaping Use   Vaping status: Never Used  Substance and Sexual Activity   Alcohol  use: No    Alcohol /week: 1.0 standard drink of alcohol     Types: 1 Cans of beer per week   Drug use: No   Sexual activity: Never  Other Topics Concern   Not on file  Social History Narrative   Patient lives in Independent living @ [facility]   Patient is right-handed.   Patient does not drink any caffeine.   Social Drivers of Corporate Investment Banker Strain: Not on file  Food Insecurity: No Food Insecurity (09/07/2023)   Hunger Vital Sign    Worried About Running Out of Food in the Last Year: Never true    Ran Out of Food in the Last Year: Never true  Transportation Needs: No Transportation Needs (09/07/2023)   PRAPARE - Administrator, Civil Service (Medical): No    Lack of Transportation (Non-Medical): No  Physical Activity: Not on file  Stress: Not on file  Social Connections: Moderately Isolated (08/28/2023)   Social Connection and Isolation Panel    Frequency of Communication with Friends and Family: More than three times a week    Frequency of  Social Gatherings with Friends and Family: Twice a week    Attends Religious Services: Never    Database Administrator or Organizations: Yes    Attends Engineer, Structural: More than 4 times per year    Marital Status: Divorced  Intimate Partner Violence: Not At Risk (09/07/2023)   Humiliation, Afraid, Rape, and Kick questionnaire    Fear of Current or Ex-Partner: No    Emotionally Abused: No    Physically Abused: No    Sexually Abused: No    Medications:   Current Outpatient Medications on File Prior to Visit  Medication Sig Dispense Refill   acetaminophen  (TYLENOL ) 500 MG tablet Take 1-2 tablets (500-1,000 mg total) by mouth every 6 (six) hours as needed for moderate pain. 30 tablet 0   allopurinol  (ZYLOPRIM ) 100 MG tablet Take 150 mg by mouth daily.     amLODipine  (NORVASC ) 10 MG tablet Take 1 tablet (10 mg total) by mouth daily. 90 tablet 3   benzonatate  (TESSALON ) 200 MG capsule Take 1 capsule (200 mg total) by mouth in the morning and at bedtime. 60 capsule 2   busPIRone  (BUSPAR ) 30 MG tablet Take 30 mg by mouth 2 (two) times daily.     cetirizine  (ZYRTEC  ALLERGY) 10 MG tablet Take 1 tablet (10 mg total) by mouth daily. 30 tablet 0   clindamycin (CLEOCIN) 300 MG capsule Take 300 mg by mouth 3 (three) times daily.     [Paused]  clopidogrel  (PLAVIX ) 75 MG tablet Take 1 tablet (75 mg total) by mouth daily. Okay to restart this medication on 08/22/2023.     dextromethorphan  (DELSYM ) 30 MG/5ML liquid Take 5 mLs (30 mg total) by mouth 2 (two) times daily as needed for cough. (Patient taking differently: Take by mouth 2 (two) times daily as needed for cough. Pt states he takes it every 12 hours ATC) 89 mL 0   ferrous sulfate  325 (65 FE) MG EC tablet Take 1 tablet (325 mg total) by mouth daily with breakfast. 30 tablet 3   fluticasone  (FLONASE ) 50 MCG/ACT nasal spray Place 1 spray into both nostrils daily. 16 g 2   folic acid  (FOLVITE ) 1 MG tablet Take 1 mg by mouth daily.      furosemide  (LASIX ) 40 MG tablet Take 1 tablet (40 mg total) by mouth daily. 90 tablet 3   hydrALAZINE  (APRESOLINE ) 10 MG tablet Take 1 tablet (10 mg total) by mouth 2 (two) times daily. 180 tablet 3   Hydroxychloroquine  Sulfate 400 MG TABS Take 400 mg by mouth at bedtime.     ketoconazole (NIZORAL) 2 % cream Apply 1 Application topically daily.     Lactobacillus (ACIDOPHILUS) CAPS capsule Take 1 capsule by mouth daily. 100 capsule 0   lidocaine  (LIDODERM ) 5 % Place 1 patch onto the skin daily as needed (for pain- Remove & Discard patch within 12 hours or as directed by MD).     lidocaine  (XYLOCAINE ) 5 % ointment Apply 1 Application topically as needed. 35.44 g 0   methocarbamol  (ROBAXIN ) 500 MG tablet Take 1 tablet (500 mg total) by mouth 2 (two) times daily. 20 tablet 0   Multiple Vitamin (MULTIVITAMIN) TABS TAKE 1 TABLET BY MOUTH DAILY. 27 tablet 1   Multiple Vitamins-Minerals (MULTIVITAMIN) tablet Take 1 tablet by mouth daily. 30 tablet 1   PARoxetine  (PAXIL -CR) 37.5 MG 24 hr tablet Take 1 tablet (37.5 mg total) by mouth at bedtime.     primidone  (MYSOLINE ) 250 MG tablet Take 1 tablet (250 mg total) by mouth 2 (two) times daily. 60 tablet 2   QUEtiapine  (SEROQUEL ) 25 MG tablet Take 25 mg by mouth at bedtime.     rosuvastatin  (CRESTOR ) 40 MG tablet Take 40 mg by mouth daily.     traZODone  (DESYREL ) 100 MG tablet Take 100 mg by mouth at bedtime.     valACYclovir  (VALTREX ) 1000 MG tablet Take 1,000 mg by mouth daily.     Vitamin D , Ergocalciferol , (DRISDOL ) 1.25 MG (50000 UNIT) CAPS capsule Take 50,000 Units by mouth every Saturday.     HYDROcodone  bit-homatropine (HYCODAN) 5-1.5 MG/5ML syrup Take 5 mLs by mouth every 4 (four) hours as needed (Persistent cough). (Patient not taking: Reported on 11/28/2023) 120 mL 0   hydrocortisone cream 1 % Apply 1 Application topically. (Patient not taking: Reported on 11/28/2023)     loperamide (IMODIUM) 2 MG capsule 2 mg as needed. (Patient not taking:  Reported on 11/28/2023)     No current facility-administered medications on file prior to visit.    Allergies:   Allergies  Allergen Reactions   Divalproex Sodium Hives   Lamotrigine Diarrhea and Other (See Comments)    Shakes and unsteadiness     Valproic Acid Hives and Other (See Comments)    Shakes GI problems, too   Amitriptyline Other (See Comments)    Reaction not known   Aspirin  Nausea And Vomiting and Other (See Comments)    Dizziness, weakness and balance issues, and sweats  too   Hydrocodone  Nausea And Vomiting   Lisinopril Cough         Lithium Diarrhea and Other (See Comments)    Falls over and shakes    Morphine  Other (See Comments)     'not tolerated well   Nsaids Other (See Comments)    Kidney disease   Oxycodone Nausea And Vomiting   Oxycodone-Acetaminophen  Nausea And Vomiting and Other (See Comments)    Also causes dizziness   Pork Allergy Nausea Only    Physical Exam General: Obese middle-aged African-American male, seated, in no evident distress Head: head normocephalic and atraumatic.   Neck: supple with no carotid or supraclavicular bruits Cardiovascular: regular rate and rhythm, no murmurs Musculoskeletal: no deformity Skin:  no rash/petichiae Vascular:  Normal pulses all extremities  Neurologic Exam Mental Status: Awake and fully alert. Oriented to place and time. Recent and remote memory intact. Attention span, concentration and fund of knowledge appropriate. Mood and affect appropriate.  Diminished recall 1/3.  Able to name 9 animals which can walk on 4 legs.  Clock drawing 4/4. Cranial Nerves: Fundoscopic exam not done. Pupils equal, briskly reactive to light. Extraocular movements full without nystagmus. Visual fields full to confrontation. Hearing intact. Facial sensation intact. Face, tongue, palate moves normally and symmetrically.  Motor: Normal bulk and tone. Normal strength in all tested extremity muscles.  Mild left grip weakness.   Diminished fine finger movements on the left.  Orbits right over left upper extremity.  Mild action tremor of upper extremities left greater than right.  Absent at rest.  No cogwheel rigidity Sensory.: intact to touch , pinprick , position and vibratory sensation.  Coordination: Rapid alternating movements normal in all extremities. Finger-to-nose and heel-to-shin performed accurately bilaterally. Gait and Station: Arises from chair without difficulty. Stance is normal. Gait demonstrates normal stride length and balance . Able to heel, toe and tandem walk with mild difficulty.  Reflexes: 1+ and symmetric. Toes downgoing.        04/06/2023    2:45 PM 05/18/2022    3:08 PM 01/09/2020    7:28 AM 03/25/2019   11:18 AM 09/12/2018   10:12 AM 03/12/2018   10:17 AM 09/26/2017    8:36 AM  MMSE - Mini Mental State Exam  Not completed:    --     Orientation to time 5 5 4 3 3 4 3   Orientation to Place 5 5 5 4 4 5 5   Registration 3 3 3 3 3 3 3   Attention/ Calculation 5 5 3 2 2 5 5   Recall 3 0 3 3 3 2 3   Language- name 2 objects 2 2 2 2 2 2 2   Language- repeat 1 1 1 1 1 1 1   Language- follow 3 step command 3 3 3 3 3 3 3   Language- read & follow direction 1 1 1 1 1 1 1   Write a sentence 1 1 1 1 1 1 1   Copy design 0 1 1 1 1 1 1   Copy design-comments     named 3 animals    Total score 29 27 27 24 24 28 28       ASSESSMENT: 73 year old African-American male with new onset daily headaches in 2022 of unclear etiology which seems to have responded to gabapentin  and have resolved...  Remote history of right brain subcortical infarct in 2011 with multiple vascular risk factors of obesity, hypertension hyperlipidemia and suspected sleep apnea Longstanding history of benign essential tremor which appears  stable.  Subjective memory difficulties daily due to likely mild age-related cognitive impairment. New new diagnosis of lung cancer and has completed chemotherapy and radiation.  Immunotherapy soon.   PLAN: I  had a long discussion with the patient and  his daughter regarding his  ,remote stroke, mild cognitive impairment, essential tremors all of which appears quite stable.  He has been diagnosed with lung cancer recently and finished his chemotherapy and radiation and will start immunotherapy soon.  He is doing well with neurovascular standpoint without recurrent stroke or TIA symptoms.  I recommend he resume Plavix  for stroke prevention but if he has recurrent hemoptysis he may stop it and he is quite reluctant to try aspirin  as he believes he is allergic to it in the past.  Continue with aggressive risk factor modification strict control of hypertension blood pressure goal below 130/90 and lipids with LDL cholesterol goal below 70 mg percent.  Continue Mysoline  250 mg twice daily for his essential tremor which appears to be quite well controlled and is not functionally disabling.  He also has mild memory difficulties due to age-related mild cognitive impairment.  I recommend increase participation in cognitively challenging activities like solving crossword puzzles, playing bridge and sudoku.  We also discussed memory compensation strategies.  Return for follow-up in the future in 1 year or call earlier if necessary.  I personally spent a total of 35 minutes in the care of the patient today including getting/reviewing separately obtained history, performing a medically appropriate exam/evaluation, counseling and educating, placing orders, referring and communicating with other health care professionals, documenting clinical information in the EHR, independently interpreting results, and coordinating care.        Eather Popp, MD Note: This document was prepared with digital dictation and possible smart phrase technology. Any transcriptional errors that result from this process are unintentional.

## 2023-11-29 ENCOUNTER — Inpatient Hospital Stay

## 2023-11-29 ENCOUNTER — Other Ambulatory Visit: Payer: Self-pay

## 2023-11-29 VITALS — BP 137/76 | HR 76 | Temp 98.2°F | Resp 16 | Wt 197.0 lb

## 2023-11-29 DIAGNOSIS — C3412 Malignant neoplasm of upper lobe, left bronchus or lung: Secondary | ICD-10-CM

## 2023-11-29 LAB — CBC WITH DIFFERENTIAL (CANCER CENTER ONLY)
Abs Immature Granulocytes: 0.02 K/uL (ref 0.00–0.07)
Basophils Absolute: 0.1 K/uL (ref 0.0–0.1)
Basophils Relative: 1 %
Eosinophils Absolute: 0.4 K/uL (ref 0.0–0.5)
Eosinophils Relative: 6 %
HCT: 31.4 % — ABNORMAL LOW (ref 39.0–52.0)
Hemoglobin: 10.8 g/dL — ABNORMAL LOW (ref 13.0–17.0)
Immature Granulocytes: 0 %
Lymphocytes Relative: 18 %
Lymphs Abs: 1.1 K/uL (ref 0.7–4.0)
MCH: 31.4 pg (ref 26.0–34.0)
MCHC: 34.4 g/dL (ref 30.0–36.0)
MCV: 91.3 fL (ref 80.0–100.0)
Monocytes Absolute: 0.8 K/uL (ref 0.1–1.0)
Monocytes Relative: 12 %
Neutro Abs: 4.1 K/uL (ref 1.7–7.7)
Neutrophils Relative %: 63 %
Platelet Count: 366 K/uL (ref 150–400)
RBC: 3.44 MIL/uL — ABNORMAL LOW (ref 4.22–5.81)
RDW: 17.9 % — ABNORMAL HIGH (ref 11.5–15.5)
WBC Count: 6.4 K/uL (ref 4.0–10.5)
nRBC: 0 % (ref 0.0–0.2)

## 2023-11-29 LAB — CMP (CANCER CENTER ONLY)
ALT: 9 U/L (ref 0–44)
AST: 14 U/L — ABNORMAL LOW (ref 15–41)
Albumin: 3.9 g/dL (ref 3.5–5.0)
Alkaline Phosphatase: 72 U/L (ref 38–126)
Anion gap: 6 (ref 5–15)
BUN: 14 mg/dL (ref 8–23)
CO2: 27 mmol/L (ref 22–32)
Calcium: 8.9 mg/dL (ref 8.9–10.3)
Chloride: 104 mmol/L (ref 98–111)
Creatinine: 1.32 mg/dL — ABNORMAL HIGH (ref 0.61–1.24)
GFR, Estimated: 58 mL/min — ABNORMAL LOW (ref >60)
Glucose, Bld: 88 mg/dL (ref 70–99)
Potassium: 3.9 mmol/L (ref 3.5–5.1)
Sodium: 137 mmol/L (ref 135–145)
Total Bilirubin: 0.2 mg/dL (ref 0.0–1.2)
Total Protein: 7.4 g/dL (ref 6.5–8.1)

## 2023-11-29 LAB — TSH: TSH: 3.08 u[IU]/mL (ref 0.350–4.500)

## 2023-11-29 MED ORDER — SODIUM CHLORIDE 0.9 % IV SOLN: INTRAVENOUS | Status: DC

## 2023-11-29 MED ORDER — SODIUM CHLORIDE 0.9 % IV SOLN
1500.0000 mg | Freq: Once | INTRAVENOUS | Status: AC
  Administered 2023-11-29: 1500 mg via INTRAVENOUS
  Filled 2023-11-29: qty 30

## 2023-11-29 NOTE — Patient Instructions (Signed)
 [facility] - A DEPT OF [facility]  Discharge Instructions: Thank you for choosing [facility] to provide your oncology and hematology care.   If you have a lab appointment with the Cancer Center, please go directly to the Cancer Center and check in at the registration area.   Wear comfortable clothing and clothing appropriate for easy access to any Portacath or PICC line.   We strive to give you quality time with your provider. You may need to reschedule your appointment if you arrive late (15 or more minutes).  Arriving late affects you and other patients whose appointments are after yours.  Also, if you miss three or more appointments without notifying the office, you may be dismissed from the clinic at the provider's discretion.      For prescription refill requests, have your pharmacy contact our office and allow 72 hours for refills to be completed.    Today you received the following chemotherapy and/or immunotherapy agents: durvalumab (IMFINZI) Durvalumab Injection What is this medication? DURVALUMAB (dur VAL ue mab) treats some types of cancer. It works by helping your immune system slow or stop the spread of cancer cells. It is a monoclonal antibody. This medicine may be used for other purposes; ask your health care provider or pharmacist if you have questions. COMMON BRAND NAME(S): IMFINZI What should I tell my care team before I take this medication? They need to know if you have any of these conditions: Allogeneic stem cell transplant (uses someone else's stem cells) Autoimmune diseases, such as Crohn disease, ulcerative colitis, lupus History of chest radiation Nervous system problems, such as Guillain-Barre syndrome, myasthenia gravis Organ transplant An unusual or allergic reaction to durvalumab, other medications, foods, dyes, or preservatives Pregnant or trying to get pregnant Breast-feeding How should I use this  medication? This medication is infused into a vein. It is given by your care team in a hospital or clinic setting. A special MedGuide will be given to you before each treatment. Be sure to read this information carefully each time. Talk to your care team about the use of this medication in children. Special care may be needed. Overdosage: If you think you have taken too much of this medicine contact a poison control center or emergency room at once. NOTE: This medicine is only for you. Do not share this medicine with others. What if I miss a dose? Keep appointments for follow-up doses. It is important not to miss your dose. Call your care team if you are unable to keep an appointment. What may interact with this medication? Interactions have not been studied. This list may not describe all possible interactions. Give your health care provider a list of all the medicines, herbs, non-prescription drugs, or dietary supplements you use. Also tell them if you smoke, drink alcohol , or use illegal drugs. Some items may interact with your medicine. What should I watch for while using this medication? Your condition will be monitored carefully while you are receiving this medication. You may need blood work while taking this medication. This medication may cause serious skin reactions. They can happen weeks to months after starting the medication. Contact your care team right away if you notice fevers or flu-like symptoms with a rash. The rash may be red or purple and then turn into blisters or peeling of the skin. You may also notice a red rash with swelling of the face, lips, or lymph nodes in your  neck or under your arms. Tell your care team right away if you have any change in your eyesight. Talk to your care team if you may be pregnant. Serious birth defects can occur if you take this medication during pregnancy and for 3 months after the last dose. You will need a negative pregnancy test before starting  this medication. Contraception is recommended while taking this medication and for 3 months after the last dose. Your care team can help you find the option that works for you. Do not breastfeed while taking this medication and for 3 months after the last dose. What side effects may I notice from receiving this medication? Side effects that you should report to your care team as soon as possible: Allergic reactions--skin rash, itching, hives, swelling of the face, lips, tongue, or throat Dry cough, shortness of breath or trouble breathing Eye pain, redness, irritation, or discharge with blurry or decreased vision Heart muscle inflammation--unusual weakness or fatigue, shortness of breath, chest pain, fast or irregular heartbeat, dizziness, swelling of the ankles, feet, or hands Hormone gland problems--headache, sensitivity to light, unusual weakness or fatigue, dizziness, fast or irregular heartbeat, increased sensitivity to cold or heat, excessive sweating, constipation, hair loss, increased thirst or amount of urine, tremors or shaking, irritability Infusion reactions--chest pain, shortness of breath or trouble breathing, feeling faint or lightheaded Kidney injury (glomerulonephritis)--decrease in the amount of urine, red or dark brown urine, foamy or bubbly urine, swelling of the ankles, hands, or feet Liver injury--right upper belly pain, loss of appetite, nausea, light-colored stool, dark yellow or brown urine, yellowing skin or eyes, unusual weakness or fatigue Pain, tingling, or numbness in the hands or feet, muscle weakness, change in vision, confusion or trouble speaking, loss of balance or coordination, trouble walking, seizures Rash, fever, and swollen lymph nodes Redness, blistering, peeling, or loosening of the skin, including inside the mouth Sudden or severe stomach pain, bloody diarrhea, fever, nausea, vomiting Side effects that usually do not require medical attention (report these  to your care team if they continue or are bothersome): Bone, joint, or muscle pain Diarrhea Fatigue Loss of appetite Nausea Skin rash This list may not describe all possible side effects. Call your doctor for medical advice about side effects. You may report side effects to FDA at 1-800-FDA-1088. Where should I keep my medication? This medication is given in a hospital or clinic. It will not be stored at home. NOTE: This sheet is a summary. It may not cover all possible information. If you have questions about this medicine, talk to your doctor, pharmacist, or health care provider.  2024 Elsevier/Gold Standard (2021-05-18 00:00:00)   To help prevent nausea and vomiting after your treatment, we encourage you to take your nausea medication as directed.  BELOW ARE SYMPTOMS THAT SHOULD BE REPORTED IMMEDIATELY: *FEVER GREATER THAN 100.4 F (38 C) OR HIGHER *CHILLS OR SWEATING *NAUSEA AND VOMITING THAT IS NOT CONTROLLED WITH YOUR NAUSEA MEDICATION *UNUSUAL SHORTNESS OF BREATH *UNUSUAL BRUISING OR BLEEDING *URINARY PROBLEMS (pain or burning when urinating, or frequent urination) *BOWEL PROBLEMS (unusual diarrhea, constipation, pain near the anus) TENDERNESS IN MOUTH AND THROAT WITH OR WITHOUT PRESENCE OF ULCERS (sore throat, sores in mouth, or a toothache) UNUSUAL RASH, SWELLING OR PAIN  UNUSUAL VAGINAL DISCHARGE OR ITCHING   Items with * indicate a potential emergency and should be followed up as soon as possible or go to the Emergency Department if any problems should occur.  Please show the CHEMOTHERAPY ALERT CARD or  IMMUNOTHERAPY ALERT CARD at check-in to the Emergency Department and triage nurse.  Should you have questions after your visit or need to cancel or reschedule your appointment, please contact [facility] DEPT [facility]  Dept: [phone]  and follow the prompts.  Office hours are 8:00 a.m. to 4:30 p.m. Monday - Friday. Please note  that voicemails left after 4:00 p.m. may not be returned until the following business day.  We are closed weekends and major holidays. You have access to a nurse at all times for urgent questions. Please call the main number to the clinic Dept: [phone] and follow the prompts.   For any non-urgent questions, you may also contact your provider using MyChart. We now offer e-Visits for anyone 10 and older to request care online for non-urgent symptoms. For details visit [email or web address].   Also download the MyChart app! Go to the app store, search MyChart, open the app, select , and log in with your MyChart username and password.

## 2023-11-30 ENCOUNTER — Telehealth: Payer: Self-pay

## 2023-11-30 LAB — T4: T4, Total: 8.7 ug/dL (ref 4.5–12.0)

## 2023-11-30 NOTE — Telephone Encounter (Signed)
-----   Message from Nurse Milo KIDD sent at 11/29/2023  5:10 PM EST ----- Regarding: First time durvalumab Dr. Nanda patient, first time durvalumab, tolerated treatment well.

## 2023-12-04 NOTE — Progress Notes (Signed)
 Radiation Oncology         [phone] ________________________________  Name: [name] MRN: [id number]  Date: 12/05/2023  DOB: 01/24/1950  Follow-Up Visit Note  Outpatient  CC: [name], MD  [name], MD  Diagnosis and Prior Radiotherapy:    ICD-10-CM   1. Adenocarcinoma of upper lobe of left lung (HCC)  C34.12         Cancer Staging  Adenocarcinoma of upper lobe of left lung [facility]) Staging form: Lung, AJCC V9 - Clinical stage from 08/29/2023: Stage IIIA (cT3, cN1, cM0) - Signed by Izell Domino, MD on 08/29/2023 Stage prefix: Initial diagnosis - Pathologic: Stage IIIA (pT3, pN1, cM0) - Signed by Sherrod Sherrod, MD on 09/08/2023 Method of lymph node assessment: Clinical  ==========DELIVERED PLANS==========  First Treatment Date: 2023-09-06 Last Treatment Date: 2023-10-23   Plan Name: Lung_L Site: Lung, Left Technique: IMRT Mode: Photon Dose Per Fraction: 2 Gy Prescribed Dose (Delivered / Prescribed): 66 Gy / 66 Gy Prescribed Fxs (Delivered / Prescribed): 33 / 33  Stage IIIA (cT3, N1, M0) Adenocarcinoma, NSCLC, of the LUL; s/p chemoRT completed on 10/23/2023  CHIEF COMPLAINT: Here for follow-up and surveillance of NSCLC cancer  Narrative:  The patient returns today for routine follow-up.  He completed his treatment approximately 2 weeks ago.   He tolerated his treatment well overall.  He did develop some fatigue and a cough throughout his treatment.  In the interim, he underwent CT of the chest on 11/14/2023 which showed interval decrease size of the treated left upper lobe mass, consistent with treatment response ; new ground glass opacities within the left lobe, indeterminate in etiology; and an unchanged peripheral right lower lobe nodule. He saw Dr. Sherrod on 11/21/2023 to review the results.  Per his recommendations, the patient decided to proceed with immunotherapy.                    Patient notes being exhausted along with shortness of breath  with exertion and and an associated cough. He does note residual skin irritation and associated back pain in the treatment field that has not improved since completing his radiation. Pain is worse when lying down and with arm movements. He states that the lidocaine  patch has helped with his pain.    ALLERGIES:  is allergic to divalproex sodium, lamotrigine, valproic acid, amitriptyline, aspirin , hydrocodone , lisinopril, lithium, morphine , nsaids, oxycodone, oxycodone-acetaminophen , and pork allergy.  Meds: Current Outpatient Medications  Medication Sig Dispense Refill   acetaminophen  (TYLENOL ) 500 MG tablet Take 1-2 tablets (500-1,000 mg total) by mouth every 6 (six) hours as needed for moderate pain. 30 tablet 0   allopurinol  (ZYLOPRIM ) 100 MG tablet Take 150 mg by mouth daily.     amLODipine  (NORVASC ) 10 MG tablet Take 1 tablet (10 mg total) by mouth daily. 90 tablet 3   benzonatate  (TESSALON ) 200 MG capsule Take 1 capsule (200 mg total) by mouth in the morning and at bedtime. 60 capsule 2   busPIRone  (BUSPAR ) 30 MG tablet Take 30 mg by mouth 2 (two) times daily.     butalbital-acetaminophen -caffeine (FIORICET) 50-325-40 MG tablet Take 1 tablet by mouth every 6 (six) hours as needed for headache.     cetirizine  (ZYRTEC  ALLERGY) 10 MG tablet Take 1 tablet (10 mg total) by mouth daily. 30 tablet 0   clindamycin (CLEOCIN) 300 MG capsule Take 300 mg by mouth 3 (three) times daily.     dextromethorphan  (DELSYM ) 30 MG/5ML liquid Take 5 mLs (30  mg total) by mouth 2 (two) times daily as needed for cough. 89 mL 0   ferrous sulfate  325 (65 FE) MG EC tablet Take 1 tablet (325 mg total) by mouth daily with breakfast. 30 tablet 3   fluticasone  (FLONASE ) 50 MCG/ACT nasal spray Place 1 spray into both nostrils daily. 16 g 2   folic acid  (FOLVITE ) 1 MG tablet Take 1 mg by mouth daily.     furosemide  (LASIX ) 40 MG tablet Take 1 tablet (40 mg total) by mouth daily. 90 tablet 3   hydrALAZINE  (APRESOLINE ) 10 MG  tablet Take 1 tablet (10 mg total) by mouth 2 (two) times daily. 180 tablet 3   HYDROcodone  bit-homatropine (HYCODAN) 5-1.5 MG/5ML syrup Take 5 mLs by mouth every 4 (four) hours as needed (Persistent cough). 120 mL 0   hydrocortisone cream 1 % Apply 1 Application topically.     Hydroxychloroquine  Sulfate 400 MG TABS Take 400 mg by mouth at bedtime.     ketoconazole (NIZORAL) 2 % cream Apply 1 Application topically daily.     Lactobacillus (ACIDOPHILUS) CAPS capsule Take 1 capsule by mouth daily. 100 capsule 0   lidocaine  (LIDODERM ) 5 % Place 1 patch onto the skin daily as needed (for pain- Remove & Discard patch within 12 hours or as directed by MD).     lidocaine  (XYLOCAINE ) 5 % ointment Apply 1 Application topically as needed. 35.44 g 0   loperamide (IMODIUM) 2 MG capsule 2 mg as needed.     methocarbamol  (ROBAXIN ) 500 MG tablet Take 1 tablet (500 mg total) by mouth 2 (two) times daily. 20 tablet 0   Multiple Vitamin (MULTIVITAMIN) TABS TAKE 1 TABLET BY MOUTH DAILY. 27 tablet 1   PARoxetine  (PAXIL -CR) 37.5 MG 24 hr tablet Take 1 tablet (37.5 mg total) by mouth at bedtime.     primidone  (MYSOLINE ) 250 MG tablet Take 1 tablet (250 mg total) by mouth 2 (two) times daily. 60 tablet 2   QUEtiapine  (SEROQUEL ) 25 MG tablet Take 25 mg by mouth at bedtime.     rosuvastatin  (CRESTOR ) 40 MG tablet Take 40 mg by mouth daily.     traZODone  (DESYREL ) 100 MG tablet Take 100 mg by mouth at bedtime.     valACYclovir  (VALTREX ) 1000 MG tablet Take 1,000 mg by mouth daily.     Vitamin D , Ergocalciferol , (DRISDOL ) 1.25 MG (50000 UNIT) CAPS capsule Take 50,000 Units by mouth every Saturday.     [Paused] clopidogrel  (PLAVIX ) 75 MG tablet Take 1 tablet (75 mg total) by mouth daily. Okay to restart this medication on 08/22/2023.     Multiple Vitamins-Minerals (MULTIVITAMIN) tablet Take 1 tablet by mouth daily. (Patient not taking: Reported on 12/05/2023) 30 tablet 1   No current facility-administered medications for  this encounter.    Physical Findings: The patient is in no acute distress. Patient is alert and oriented.  height is 5' 7 (1.702 m) and weight is 198 lb 12.8 oz (90.2 kg). His temperature is 97.9 F (36.6 C). His blood pressure is 111/76 and his pulse is 94. His respiration is 18 and oxygen saturation is 99%. .    General: Alert and oriented, in no acute distress HEENT: Head is normocephalic. Extraocular movements are intact.  Neck: Neck is supple, no palpable cervical or supraclavicular lymphadenopathy. Heart: Regular in rate and rhythm with no murmurs, rubs, or gallops. Chest: Clear to auscultation bilaterally, with no rhonchi, wheezes, or rales. Abdomen: Soft, nontender, nondistended, with no rigidity or guarding. Extremities: No  cyanosis or edema. Lymphatics: see Neck Exam Skin: Hyperpigmentation within the treatment field, consistent with treatment changes.  Musculoskeletal: symmetric strength and muscle tone throughout. No tenderness to palpation along the left side of the back.  Neurologic: Cranial nerves II through XII are grossly intact. No obvious focalities. Speech is fluent. Coordination is intact. Psychiatric: Judgment and insight are intact. Affect is appropriate.    Lab Findings: Lab Results  Component Value Date   WBC 6.4 11/29/2023   HGB 10.8 (L) 11/29/2023   HCT 31.4 (L) 11/29/2023   MCV 91.3 11/29/2023   PLT 366 11/29/2023    Radiographic Findings: DG Chest 2 View Result Date: 11/18/2023 CLINICAL DATA:  Left chest and shoulder pain.  Left lung carcinoma. EXAM: CHEST - 2 VIEW COMPARISON:  Chest CT on 11/14/2023 FINDINGS: The heart size and mediastinal contours are within normal limits. Right-sided power port is seen in appropriate position. Left upper lobe opacity in volume loss shows no significant change compared to recent CT, consistent with left upper lobe mass and associated post treatment changes. No new areas of pulmonary opacity are seen. No pneumothorax  or pleural effusion. Thoracic spine degenerative changes again noted IMPRESSION: Stable left upper lobe opacity and volume loss, consistent with known left upper lobe mass and associated post treatment changes. No acute findings. Electronically Signed   By: Norleen DELENA Kil M.D.   On: 11/18/2023 14:00   CT Chest W Contrast Result Date: 11/16/2023 CLINICAL DATA:  Left upper lobe adenocarcinoma and left hilar lymphadenopathy status post chemoradiation completed 10/16/2023. Assess treatment response. * Tracking Code: BO * EXAM: CT CHEST WITH CONTRAST TECHNIQUE: Multidetector CT imaging of the chest was performed during intravenous contrast administration. RADIATION DOSE REDUCTION: This exam was performed according to the departmental dose-optimization program which includes automated exposure control, adjustment of the mA and/or kV according to patient size and/or use of iterative reconstruction technique. CONTRAST:  75mL OMNIPAQUE  IOHEXOL  300 MG/ML  SOLN COMPARISON:  CTA chest dated 08/27/2023 and multiple priors FINDINGS: Cardiovascular: Right chest wall port terminates in the lower SVC. Normal heart size. No significant pericardial fluid/thickening. Great vessels are normal in course and caliber. No central pulmonary emboli. Aortic atherosclerosis. Mediastinum/Nodes: Imaged thyroid  gland without nodules meeting criteria for imaging follow-up by size. Small hiatal hernia. No pathologically enlarged axillary, supraclavicular, mediastinal, or hilar lymph nodes. Lungs/Pleura: The central airways are patent. Interval decreased size of left upper lobe mass measuring 4.4 x 4.2 cm (7:39), previously 5.6 x 5.0 cm, which continues to extend into the mediastinum, where there is increased volume loss and architectural distortion. Additional areas of volume loss and platelike consolidation is also seen along the lateral aspect of the mass with adjacent ground-glass opacity (7:33). New ground-glass opacities within the anterior  left upper lobe and lingula and perifissural right lower lobe measuring 9 x 5 mm (7:56). Unchanged peripheral right lower lobe ground-glass nodule measuring 1.4 x 1.3 cm (remeasured). Recanalization of superior segment left lower lobe bronchus. no pneumothorax. No pleural effusion. Upper abdomen: Unchanged hepatic hypodensities, many of which are too small to characterize. The largest in segment 2/3 remains consistent with a cyst. Cholecystectomy. Bilateral renal hypodensities, likely cysts. Musculoskeletal: No acute or abnormal lytic or blastic osseous lesions. Multilevel degenerative changes of the thoracic spine. IMPRESSION: 1. Interval decreased size of left upper lobe mass with increased volume loss and architectural distortion, consistent with treatment response. 2. New ground-glass opacities within the anterior and lateral left upper lobe, lingula, perifissural right lower lobe, indeterminate, may  reflect postradiation change or superimposed infection/inflammation. 3. Unchanged peripheral right lower lobe ground-glass nodule. 4.  Aortic Atherosclerosis (ICD10-I70.0). Electronically Signed   By: Limin  Xu M.D.   On: 11/16/2023 14:55    Impression/Plan:  Stage IIIA (cT3, N1, M0) Adenocarcinoma, NSCLC, of the LUL  Patient continues to heal from the effects of his radiation treatment. We discussed the natural course of healing today.   Radiation Dermatitis: - Hyperpigmentation appreciated on PE today - Encouraged patient to use Radiaplex as prescribed - Additional Radiaplex given to patient  Left parascapular pain exacerbated with lying down and movements: - Imaging is negative for acute findings, suspected musculoskeletal etiology. We reviewed his treatment plans and his scapula and chest wall were within the treatment field. Pain could be secondary to radiation, but should improve over time.  - Recommend continued Robaxin  and lidocaine  patch as prescribed  Fatigue: - Discussed ways to help with  fatigue including good sleep hygiene, adequate caloric intake, and light exercise  Dry cough associated with exertion: - I encouraged patient to stop taking Tessalon  pearls to limit polypharmacy - Can start this again if needed  He will proceed with immunotherapy follow-up under the care of Dr. Sherrod. He is scheduled to see medical oncology on 12/26/2023. Radiation follow-up PRN. We appreciate the opportunity to take part in this patient's care.    On date of service, in total, I spent 30 minutes on this encounter. Patient was seen in person.  _____________________________________    Leeroy Due, PA-C

## 2023-12-04 NOTE — Progress Notes (Incomplete Revision)
 Radiation Oncology         [phone] ________________________________  Name: [name] MRN: [id number]  Date: 12/05/2023  DOB: May 19, 1950  Follow-Up Visit Note  Outpatient  CC: [name], MD  [name], MD  Diagnosis and Prior Radiotherapy:    ICD-10-CM   1. Adenocarcinoma of upper lobe of left lung (HCC)  C34.12         Cancer Staging  Adenocarcinoma of upper lobe of left lung [facility]) Staging form: Lung, AJCC V9 - Clinical stage from 08/29/2023: Stage IIIA (cT3, cN1, cM0) - Signed by Izell Domino, MD on 08/29/2023 Stage prefix: Initial diagnosis - Pathologic: Stage IIIA (pT3, pN1, cM0) - Signed by Sherrod Sherrod, MD on 09/08/2023 Method of lymph node assessment: Clinical  ==========DELIVERED PLANS==========  First Treatment Date: 2023-09-06 Last Treatment Date: 2023-10-23   Plan Name: Lung_L Site: Lung, Left Technique: IMRT Mode: Photon Dose Per Fraction: 2 Gy Prescribed Dose (Delivered / Prescribed): 66 Gy / 66 Gy Prescribed Fxs (Delivered / Prescribed): 33 / 33  Stage IIIA (cT3, N1, M0) Adenocarcinoma, NSCLC, of the LUL; s/p chemoRT completed on 10/23/2023  CHIEF COMPLAINT: Here for follow-up and surveillance of NSCLC cancer  Narrative:  The patient returns today for routine follow-up.  He completed his treatment approximately 2 weeks ago.   He tolerated his treatment well overall.  He did develop some fatigue and a cough throughout his treatment.  In the interim, he underwent CT of the chest on 11/14/2023 which showed interval decrease size of the treated left upper lobe mass, consistent with treatment response ; new ground glass opacities within the left lobe, indeterminate in etiology; and an unchanged peripheral right lower lobe nodule. He saw Dr. Sherrod on 11/21/2023 to review the results.  Per his recommendations, the patient decided to proceed with immunotherapy.                    Patient notes being exhausted along with shortness of breath  with exertion and and an associated cough. He does note residual skin irritation and associated back pain in the treatment field that has not improved since completing his radiation. Pain is worse when lying down and with arm movements. He states that the lidocaine  patch has helped with his pain.    ALLERGIES:  is allergic to divalproex sodium, lamotrigine, valproic acid, amitriptyline, aspirin , hydrocodone , lisinopril, lithium, morphine , nsaids, oxycodone, oxycodone-acetaminophen , and pork allergy.  Meds: Current Outpatient Medications  Medication Sig Dispense Refill   acetaminophen  (TYLENOL ) 500 MG tablet Take 1-2 tablets (500-1,000 mg total) by mouth every 6 (six) hours as needed for moderate pain. 30 tablet 0   allopurinol  (ZYLOPRIM ) 100 MG tablet Take 150 mg by mouth daily.     amLODipine  (NORVASC ) 10 MG tablet Take 1 tablet (10 mg total) by mouth daily. 90 tablet 3   benzonatate  (TESSALON ) 200 MG capsule Take 1 capsule (200 mg total) by mouth in the morning and at bedtime. 60 capsule 2   busPIRone  (BUSPAR ) 30 MG tablet Take 30 mg by mouth 2 (two) times daily.     butalbital-acetaminophen -caffeine (FIORICET) 50-325-40 MG tablet Take 1 tablet by mouth every 6 (six) hours as needed for headache.     cetirizine  (ZYRTEC  ALLERGY) 10 MG tablet Take 1 tablet (10 mg total) by mouth daily. 30 tablet 0   clindamycin (CLEOCIN) 300 MG capsule Take 300 mg by mouth 3 (three) times daily.     dextromethorphan  (DELSYM ) 30 MG/5ML liquid Take 5 mLs (30  mg total) by mouth 2 (two) times daily as needed for cough. 89 mL 0   ferrous sulfate  325 (65 FE) MG EC tablet Take 1 tablet (325 mg total) by mouth daily with breakfast. 30 tablet 3   fluticasone  (FLONASE ) 50 MCG/ACT nasal spray Place 1 spray into both nostrils daily. 16 g 2   folic acid  (FOLVITE ) 1 MG tablet Take 1 mg by mouth daily.     furosemide  (LASIX ) 40 MG tablet Take 1 tablet (40 mg total) by mouth daily. 90 tablet 3   hydrALAZINE  (APRESOLINE ) 10 MG  tablet Take 1 tablet (10 mg total) by mouth 2 (two) times daily. 180 tablet 3   HYDROcodone  bit-homatropine (HYCODAN) 5-1.5 MG/5ML syrup Take 5 mLs by mouth every 4 (four) hours as needed (Persistent cough). 120 mL 0   hydrocortisone cream 1 % Apply 1 Application topically.     Hydroxychloroquine  Sulfate 400 MG TABS Take 400 mg by mouth at bedtime.     ketoconazole (NIZORAL) 2 % cream Apply 1 Application topically daily.     Lactobacillus (ACIDOPHILUS) CAPS capsule Take 1 capsule by mouth daily. 100 capsule 0   lidocaine  (LIDODERM ) 5 % Place 1 patch onto the skin daily as needed (for pain- Remove & Discard patch within 12 hours or as directed by MD).     lidocaine  (XYLOCAINE ) 5 % ointment Apply 1 Application topically as needed. 35.44 g 0   loperamide (IMODIUM) 2 MG capsule 2 mg as needed.     methocarbamol  (ROBAXIN ) 500 MG tablet Take 1 tablet (500 mg total) by mouth 2 (two) times daily. 20 tablet 0   Multiple Vitamin (MULTIVITAMIN) TABS TAKE 1 TABLET BY MOUTH DAILY. 27 tablet 1   PARoxetine  (PAXIL -CR) 37.5 MG 24 hr tablet Take 1 tablet (37.5 mg total) by mouth at bedtime.     primidone  (MYSOLINE ) 250 MG tablet Take 1 tablet (250 mg total) by mouth 2 (two) times daily. 60 tablet 2   QUEtiapine  (SEROQUEL ) 25 MG tablet Take 25 mg by mouth at bedtime.     rosuvastatin  (CRESTOR ) 40 MG tablet Take 40 mg by mouth daily.     traZODone  (DESYREL ) 100 MG tablet Take 100 mg by mouth at bedtime.     valACYclovir  (VALTREX ) 1000 MG tablet Take 1,000 mg by mouth daily.     Vitamin D , Ergocalciferol , (DRISDOL ) 1.25 MG (50000 UNIT) CAPS capsule Take 50,000 Units by mouth every Saturday.     [Paused] clopidogrel  (PLAVIX ) 75 MG tablet Take 1 tablet (75 mg total) by mouth daily. Okay to restart this medication on 08/22/2023.     Multiple Vitamins-Minerals (MULTIVITAMIN) tablet Take 1 tablet by mouth daily. (Patient not taking: Reported on 12/05/2023) 30 tablet 1   No current facility-administered medications for  this encounter.    Physical Findings: The patient is in no acute distress. Patient is alert and oriented.  height is 5' 7 (1.702 m) and weight is 198 lb 12.8 oz (90.2 kg). His temperature is 97.9 F (36.6 C). His blood pressure is 111/76 and his pulse is 94. His respiration is 18 and oxygen saturation is 99%. .    General: Alert and oriented, in no acute distress HEENT: Head is normocephalic. Extraocular movements are intact.  Neck: Neck is supple, no palpable cervical or supraclavicular lymphadenopathy. Heart: Regular in rate and rhythm with no murmurs, rubs, or gallops. Chest: Clear to auscultation bilaterally, with no rhonchi, wheezes, or rales. Abdomen: Soft, nontender, nondistended, with no rigidity or guarding. Extremities: No  cyanosis or edema. Lymphatics: see Neck Exam Skin: Hyperpigmentation within the treatment field, consistent with treatment changes.  Musculoskeletal: symmetric strength and muscle tone throughout. No tenderness to palpation along the left side of the back.  Neurologic: Cranial nerves II through XII are grossly intact. No obvious focalities. Speech is fluent. Coordination is intact. Psychiatric: Judgment and insight are intact. Affect is appropriate.    Lab Findings: Lab Results  Component Value Date   WBC 6.4 11/29/2023   HGB 10.8 (L) 11/29/2023   HCT 31.4 (L) 11/29/2023   MCV 91.3 11/29/2023   PLT 366 11/29/2023    Radiographic Findings: DG Chest 2 View Result Date: 11/18/2023 CLINICAL DATA:  Left chest and shoulder pain.  Left lung carcinoma. EXAM: CHEST - 2 VIEW COMPARISON:  Chest CT on 11/14/2023 FINDINGS: The heart size and mediastinal contours are within normal limits. Right-sided power port is seen in appropriate position. Left upper lobe opacity in volume loss shows no significant change compared to recent CT, consistent with left upper lobe mass and associated post treatment changes. No new areas of pulmonary opacity are seen. No pneumothorax  or pleural effusion. Thoracic spine degenerative changes again noted IMPRESSION: Stable left upper lobe opacity and volume loss, consistent with known left upper lobe mass and associated post treatment changes. No acute findings. Electronically Signed   By: Norleen DELENA Kil M.D.   On: 11/18/2023 14:00   CT Chest W Contrast Result Date: 11/16/2023 CLINICAL DATA:  Left upper lobe adenocarcinoma and left hilar lymphadenopathy status post chemoradiation completed 10/16/2023. Assess treatment response. * Tracking Code: BO * EXAM: CT CHEST WITH CONTRAST TECHNIQUE: Multidetector CT imaging of the chest was performed during intravenous contrast administration. RADIATION DOSE REDUCTION: This exam was performed according to the departmental dose-optimization program which includes automated exposure control, adjustment of the mA and/or kV according to patient size and/or use of iterative reconstruction technique. CONTRAST:  75mL OMNIPAQUE  IOHEXOL  300 MG/ML  SOLN COMPARISON:  CTA chest dated 08/27/2023 and multiple priors FINDINGS: Cardiovascular: Right chest wall port terminates in the lower SVC. Normal heart size. No significant pericardial fluid/thickening. Great vessels are normal in course and caliber. No central pulmonary emboli. Aortic atherosclerosis. Mediastinum/Nodes: Imaged thyroid  gland without nodules meeting criteria for imaging follow-up by size. Small hiatal hernia. No pathologically enlarged axillary, supraclavicular, mediastinal, or hilar lymph nodes. Lungs/Pleura: The central airways are patent. Interval decreased size of left upper lobe mass measuring 4.4 x 4.2 cm (7:39), previously 5.6 x 5.0 cm, which continues to extend into the mediastinum, where there is increased volume loss and architectural distortion. Additional areas of volume loss and platelike consolidation is also seen along the lateral aspect of the mass with adjacent ground-glass opacity (7:33). New ground-glass opacities within the anterior  left upper lobe and lingula and perifissural right lower lobe measuring 9 x 5 mm (7:56). Unchanged peripheral right lower lobe ground-glass nodule measuring 1.4 x 1.3 cm (remeasured). Recanalization of superior segment left lower lobe bronchus. no pneumothorax. No pleural effusion. Upper abdomen: Unchanged hepatic hypodensities, many of which are too small to characterize. The largest in segment 2/3 remains consistent with a cyst. Cholecystectomy. Bilateral renal hypodensities, likely cysts. Musculoskeletal: No acute or abnormal lytic or blastic osseous lesions. Multilevel degenerative changes of the thoracic spine. IMPRESSION: 1. Interval decreased size of left upper lobe mass with increased volume loss and architectural distortion, consistent with treatment response. 2. New ground-glass opacities within the anterior and lateral left upper lobe, lingula, perifissural right lower lobe, indeterminate, may  reflect postradiation change or superimposed infection/inflammation. 3. Unchanged peripheral right lower lobe ground-glass nodule. 4.  Aortic Atherosclerosis (ICD10-I70.0). Electronically Signed   By: Limin  Xu M.D.   On: 11/16/2023 14:55    Impression/Plan:  Stage IIIA (cT3, N1, M0) Adenocarcinoma, NSCLC, of the LUL  Patient continues to heal from the effects of his radiation treatment. We discussed the natural course of healing today.   Radiation Dermatitis: - Hyperpigmentation appreciated on PE today - Encouraged patient to use Radiaplex as prescribed - Additional Radiaplex given to patient  Left parascapular pain exacerbated with lying down and movements: - Imaging is negative for acute findings, suspected musculoskeletal etiology. The treated tumor was not near the chest wall so it would be unusual for this to be directly radiation induced. *** - Recommend continued Robaxin  and lidocaine  patch as prescribed  Fatigue: - Discussed ways to help with fatigue including good sleep hygiene, adequate  caloric intake, and light exercise  Dry cough associated with exertion: - I encouraged patient to stop taking Tessalon  pearls to limit polypharmacy - Can start this again if needed  He will proceed with immunotherapy follow-up under the care of Dr. Sherrod. He is scheduled to see medical oncology on 12/26/2023. Radiation follow-up PRN. We appreciate the opportunity to take part in this patient's care.    On date of service, in total, I spent 30 minutes on this encounter. Patient was seen in person.  _____________________________________    Leeroy Due, PA-C

## 2023-12-05 ENCOUNTER — Ambulatory Visit
Admission: RE | Admit: 2023-12-05 | Discharge: 2023-12-05 | Disposition: A | Discharge status: Home / Self Care | Payer: Self-pay | Source: Ambulatory Visit | Attending: Radiology | Admitting: Radiology

## 2023-12-05 VITALS — BP 111/76 | HR 94 | Temp 97.9°F | Resp 18 | Ht 67.0 in | Wt 198.8 lb

## 2023-12-05 DIAGNOSIS — C3412 Malignant neoplasm of upper lobe, left bronchus or lung: Secondary | ICD-10-CM

## 2023-12-05 NOTE — Progress Notes (Signed)
 [name] returns today for routine follow-up to see Christopher Due PA-C.SABRA  He completed his radiation treatment on 10-23-2023.  Lung Side: Left  Does the patient complain of any of the following: Pain: He reports left flank pain. He states that it started after the treatment was over. He went to the ED Shortness of breath w/wo exertion: sometimes Cough: Yes Hemoptysis: Denies Pain with swallowing: Denies Swallowing/choking concerns: Denies Appetite: Poor, he reports that his implant was loose and does not have an appetite. Energy Level: Fair  Post radiation skin Changes: He reports still having scarring on his back.     Additional comments if applicable:  BP 111/76 (BP Location: Left Arm, Patient Position: Sitting, Cuff Size: Large)   Pulse 94   Temp 97.9 F (36.6 C)   Resp 18   Ht 5' 7 (1.702 m)   Wt 198 lb 12.8 oz (90.2 kg)   SpO2 99%   BMI 31.14 kg/m

## 2023-12-06 ENCOUNTER — Ambulatory Visit: Attending: Cardiovascular Disease | Admitting: Pharmacist Clinician (PhC)/ Clinical Pharmacy Specialist

## 2023-12-06 ENCOUNTER — Encounter: Payer: Self-pay | Admitting: Pharmacist Clinician (PhC)/ Clinical Pharmacy Specialist

## 2023-12-06 ENCOUNTER — Other Ambulatory Visit: Payer: Self-pay | Admitting: Neurology

## 2023-12-06 ENCOUNTER — Other Ambulatory Visit: Payer: Self-pay | Admitting: Emergency Medicine

## 2023-12-06 VITALS — BP 107/74 | HR 84

## 2023-12-06 DIAGNOSIS — I10 Essential (primary) hypertension: Secondary | ICD-10-CM | POA: Insufficient documentation

## 2023-12-06 DIAGNOSIS — G250 Essential tremor: Secondary | ICD-10-CM

## 2023-12-06 NOTE — Progress Notes (Signed)
 Office Visit    Patient Name: [name] Date of Encounter: 12/06/2023  Primary Care Provider:  [name], MD Primary Cardiologist:  Stanly DELENA Leavens, MD  Chief Complaint    Hypertension  Significant Past Medical History   HFpEF 6/25 EF 65%, grade 2 DD  CKD G3a, 11/25 GFR 58  HLD On rosuvastatin  40 mg daily  preMD No longer, A1c down from 5.9 to 5.2  CVA 2011     Allergies  Allergen Reactions   Divalproex Sodium Hives   Lamotrigine Diarrhea and Other (See Comments)    Shakes and unsteadiness     Valproic Acid Hives and Other (See Comments)    Shakes GI problems, too   Amitriptyline Other (See Comments)    Reaction not known   Aspirin  Nausea And Vomiting and Other (See Comments)    Dizziness, weakness and balance issues, and sweats too   Hydrocodone  Nausea And Vomiting   Lisinopril Cough         Lithium Diarrhea and Other (See Comments)    Falls over and shakes    Morphine  Other (See Comments)     'not tolerated well   Nsaids Other (See Comments)    Kidney disease   Oxycodone Nausea And Vomiting   Oxycodone-Acetaminophen  Nausea And Vomiting and Other (See Comments)    Also causes dizziness   Pork Allergy Nausea Only    History of Present Illness    [name] is a 73 y.o. male patient of Dr Leavens, in the office today for hypertension evaluation.   He is alone today, but adds his daughter on speaker phone, as she manages his medications.   They note he had a hypotensive episode in June, at which time he was told to stop candesartan, metoprolol , furosemide , spironolactone  and hydralazine .  He was told to continue with amlodipine  5 mg daily.  At that visit a CT found new potential pulmonary neoplasm, which was diagnosed as stage 3 non-small cell lung cancer.  He underwent chemotherapy and radiation.  At some point his amlodipine  was increased to 10 mg daily, and earlier this month saw Glendia Ferrier for cardiac follow up.   His daughter  noted higher BP readings at home 130-140's systolic.  Although the office reading was good (122/74), Scott added hydralazine  10 mg twice daily  Daughter needs  to know about med changes - she coordinates all medications  Blood Pressure Goal:  130/80  Current Medications: amlodipine  10 mg daily, hydralazine  10 mg twice daily   Previously tried:  lisinopril - cough  Social Hx:      Tobacco: no  Alcohol :no  Caffeine: iced tea/lemonade combo with lunch and dinner  Diet:  limited by independent living meals at [facility] greens; protein with lunch and dinner; vegetables tend to be just a few - cauliflower, broccoli, succotash; he notes they are usually overcooked;  Has ability to make food in his unit, but usually does not.  Doesn't eat breakfast, notes things taste differently post cancer treatment  Exercise: PT for knee replacement, twice weekly  Home BP readings:  121/82  avg 11/13// to 11/19  142/90   Accessory Clinical Findings    Lab Results  Component Value Date   CREATININE 1.32 (H) 11/29/2023   BUN 14 11/29/2023   NA 137 11/29/2023   K 3.9 11/29/2023   CL 104 11/29/2023   CO2 27 11/29/2023   Lab Results  Component Value Date   ALT 9 11/29/2023   AST  14 (L) 11/29/2023   ALKPHOS 72 11/29/2023   BILITOT 0.2 11/29/2023   Lab Results  Component Value Date   HGBA1C 5.2 07/01/2023    Home Medications    Current Outpatient Medications  Medication Sig Dispense Refill   acetaminophen  (TYLENOL ) 500 MG tablet Take 1-2 tablets (500-1,000 mg total) by mouth every 6 (six) hours as needed for moderate pain. 30 tablet 0   allopurinol  (ZYLOPRIM ) 100 MG tablet Take 150 mg by mouth daily.     amLODipine  (NORVASC ) 10 MG tablet Take 1 tablet (10 mg total) by mouth daily. 90 tablet 3   benzonatate  (TESSALON ) 200 MG capsule Take 1 capsule (200 mg total) by mouth in the morning and at bedtime. 60 capsule 2   busPIRone  (BUSPAR ) 30 MG tablet Take 30 mg by mouth 2 (two) times daily.      butalbital-acetaminophen -caffeine (FIORICET) 50-325-40 MG tablet Take 1 tablet by mouth every 6 (six) hours as needed for headache.     cetirizine  (ZYRTEC  ALLERGY) 10 MG tablet Take 1 tablet (10 mg total) by mouth daily. 30 tablet 0   dextromethorphan  (DELSYM ) 30 MG/5ML liquid Take 5 mLs (30 mg total) by mouth 2 (two) times daily as needed for cough. 89 mL 0   ferrous sulfate  325 (65 FE) MG EC tablet Take 1 tablet (325 mg total) by mouth daily with breakfast. 30 tablet 3   fluticasone  (FLONASE ) 50 MCG/ACT nasal spray Place 1 spray into both nostrils daily. 16 g 2   folic acid  (FOLVITE ) 1 MG tablet Take 1 mg by mouth daily.     furosemide  (LASIX ) 40 MG tablet Take 1 tablet (40 mg total) by mouth daily. 90 tablet 3   hydrALAZINE  (APRESOLINE ) 10 MG tablet Take 1 tablet (10 mg total) by mouth 2 (two) times daily. 180 tablet 3   HYDROcodone  bit-homatropine (HYCODAN) 5-1.5 MG/5ML syrup Take 5 mLs by mouth every 4 (four) hours as needed (Persistent cough). 120 mL 0   hydrocortisone cream 1 % Apply 1 Application topically.     Hydroxychloroquine  Sulfate 400 MG TABS Take 400 mg by mouth at bedtime.     ketoconazole (NIZORAL) 2 % cream Apply 1 Application topically daily.     Lactobacillus (ACIDOPHILUS) CAPS capsule Take 1 capsule by mouth daily. 100 capsule 0   lidocaine  (LIDODERM ) 5 % Place 1 patch onto the skin daily as needed (for pain- Remove & Discard patch within 12 hours or as directed by MD).     lidocaine  (XYLOCAINE ) 5 % ointment Apply 1 Application topically as needed. 35.44 g 0   loperamide (IMODIUM) 2 MG capsule 2 mg as needed.     methocarbamol  (ROBAXIN ) 500 MG tablet Take 1 tablet (500 mg total) by mouth 2 (two) times daily. 20 tablet 0   Multiple Vitamin (MULTIVITAMIN) TABS TAKE 1 TABLET BY MOUTH DAILY. 27 tablet 1   PARoxetine  (PAXIL -CR) 37.5 MG 24 hr tablet Take 1 tablet (37.5 mg total) by mouth at bedtime.     primidone  (MYSOLINE ) 250 MG tablet TAKE 1 TABLET (250 MG TOTAL) BY MOUTH 2  (TWO) TIMES DAILY. 60 tablet 3   QUEtiapine  (SEROQUEL ) 25 MG tablet Take 25 mg by mouth at bedtime.     rosuvastatin  (CRESTOR ) 40 MG tablet Take 40 mg by mouth daily.     traZODone  (DESYREL ) 100 MG tablet Take 100 mg by mouth at bedtime.     valACYclovir  (VALTREX ) 1000 MG tablet Take 1,000 mg by mouth daily.     Vitamin  D, Ergocalciferol , (DRISDOL ) 1.25 MG (50000 UNIT) CAPS capsule Take 50,000 Units by mouth every Saturday.     Wound Cleansers (RADIAPLEX EX) Apply topically as needed.     [Paused] clopidogrel  (PLAVIX ) 75 MG tablet Take 1 tablet (75 mg total) by mouth daily. Okay to restart this medication on 08/22/2023.     No current facility-administered medications for this visit.         Assessment & Plan    Essential hypertension Assessment: BP is controlled in office BP 107/74 mmHg;  Home readings higher, device is [employer], but not validated Tolerates amlodipine  and hydralazine  well, without any side effects Denies SOB, palpitation, chest pain, headaches,or swelling Reiterated the importance of regular exercise and low salt diet   Plan:  Continue taking amlodipine  10 mg daily and hydralazine  10 mg twice daily Patient to keep record of BP readings with heart rate and report to us  at the next visit Patient to follow up with Glendia Ferrier or myself with BP readings in about a month, via [employer] ordered today:  none   Allean Mink PharmD CPP [facility]  [address] [place], [place] [address] [phone]

## 2023-12-06 NOTE — Assessment & Plan Note (Signed)
 Assessment: BP is controlled in office BP 107/74 mmHg;  Home readings higher, device is [employer], but not validated Tolerates amlodipine  and hydralazine  well, without any side effects Denies SOB, palpitation, chest pain, headaches,or swelling Reiterated the importance of regular exercise and low salt diet   Plan:  Continue taking amlodipine  10 mg daily and hydralazine  10 mg twice daily Patient to keep record of BP readings with heart rate and report to us  at the next visit Patient to follow up with [name] or myself with BP readings in about a month, via MyChart  Labs ordered today:  none

## 2023-12-06 NOTE — Telephone Encounter (Signed)
 Last seen on 12/02/23 Follow up scheduled on 11/28/24

## 2023-12-06 NOTE — Patient Instructions (Signed)
 Take your BP meds as follows: continue with amlodipine  and hydralazine   Check your blood pressure at home twice daily and keep record of the readings.  Your blood pressure goal is < 130/80  To check your pressure at home you will need to:  1. Sit up in a chair, with feet flat on the floor and back supported. Do not cross your ankles or legs. 2. Rest your left arm so that the cuff is about heart level. If the cuff goes on your upper arm,  then just relax the arm on the table, arm of the chair or your lap. If you have a wrist cuff, we  suggest relaxing your wrist against your chest (think of it as Pledging the Flag with the  wrong arm).  3. Place the cuff snugly around your arm, about 1 inch above the crook of your elbow. The  cords should be inside the groove of your elbow.  4. Sit quietly, with the cuff in place, for about 5 minutes. After that 5 minutes press the power  button to start a reading. 5. Do not talk or move while the reading is taking place.  6. Record your readings on a sheet of paper. Although most cuffs have a memory, it is often  easier to see a pattern developing when the numbers are all in front of you.  7. You can repeat the reading after 1-3 minutes if it is recommended  Make sure your bladder is empty and you have not had caffeine or tobacco within the last 30 min  Always bring your blood pressure log with you to your appointments. If you have not brought your monitor in to be double checked for accuracy, please bring it to your next appointment.  You can find a list of quality blood pressure cuffs at [email or web address]  Important lifestyle changes to control high blood pressure  Intervention  Effect on the BP  Lose extra pounds and watch your waistline Weight loss is one of the most effective lifestyle changes for controlling blood pressure. If you're overweight or obese, losing even a small amount of weight can help reduce blood pressure. Blood pressure might go  down by about 1 millimeter of mercury (mm Hg) with each kilogram (about 2.2 pounds) of weight lost.  Exercise regularly As a general goal, aim for at least 30 minutes of moderate physical activity every day. Regular physical activity can lower high blood pressure by about 5 to 8 mm Hg.  Eat a healthy diet Eating a diet rich in whole grains, fruits, vegetables, and low-fat dairy products and low in saturated fat and cholesterol. A healthy diet can lower high blood pressure by up to 11 mm Hg.  Reduce salt (sodium) in your diet Even a small reduction of sodium in the diet can improve heart health and reduce high blood pressure by about 5 to 6 mm Hg.  Limit alcohol  One drink equals 12 ounces of beer, 5 ounces of wine, or 1.5 ounces of 80-proof liquor.  Limiting alcohol  to less than one drink a day for women or two drinks a day for men can help lower blood pressure by about 4 mm Hg.   If you have any questions or concerns please use My Chart to send questions or call the office at [phone]

## 2023-12-07 NOTE — Addendum Note (Signed)
 Encounter addended by: Wyatt Leeroy HERO, PA-C on: 12/07/2023 11:04 AM  Actions taken: Clinical Note Signed

## 2023-12-19 ENCOUNTER — Other Ambulatory Visit: Payer: Self-pay | Admitting: Radiology

## 2023-12-19 DIAGNOSIS — R071 Chest pain on breathing: Secondary | ICD-10-CM

## 2023-12-19 MED ORDER — METHOCARBAMOL 500 MG PO TABS: 500.0000 mg | ORAL_TABLET | Freq: Two times a day (BID) | ORAL | 0 refills | Status: AC

## 2023-12-19 NOTE — Progress Notes (Signed)
 Patient complaining of ongoing left sided inspiratory chest pain. He denies any worsening shortness of breath. CXR showed no acute findings. Pain could be secondary to radiation induced irritation. However, given that pain has not improved, CT of the chest has been ordered for further evaluation. He is out of his muscle relaxer, so a refill for Robaxin  was also sent in to his preferred pharmacy. I will call the patient with the results and the plan moving forward. If CT shows no acute findings, we could consider a referral to palliative care for further management of symptoms. He understands to go to the ED immediately if he develops any worsening shortness of breath or chest pain.   All questions were answered and he understands to contact us  with any questions or concerns in the interim.     Leeroy Due, PA-C

## 2023-12-21 ENCOUNTER — Telehealth: Payer: Self-pay | Admitting: *Deleted

## 2023-12-21 NOTE — Telephone Encounter (Signed)
 Called patient to inform of CT for 12-29-23- arrival time- 3 pm @ [facility], no restrictions to scan, spoke with patient and he is aware of this scan

## 2023-12-22 ENCOUNTER — Other Ambulatory Visit: Payer: Self-pay | Admitting: Radiation Oncology

## 2023-12-22 ENCOUNTER — Other Ambulatory Visit: Payer: Self-pay

## 2023-12-22 ENCOUNTER — Telehealth: Payer: Self-pay | Admitting: Medical Oncology

## 2023-12-22 ENCOUNTER — Other Ambulatory Visit: Payer: Self-pay | Admitting: Radiology

## 2023-12-22 DIAGNOSIS — C3412 Malignant neoplasm of upper lobe, left bronchus or lung: Secondary | ICD-10-CM

## 2023-12-22 DIAGNOSIS — R071 Chest pain on breathing: Secondary | ICD-10-CM

## 2023-12-22 MED ORDER — GABAPENTIN 300 MG PO CAPS: 300.0000 mg | ORAL_CAPSULE | Freq: Three times a day (TID) | ORAL | 0 refills | Status: DC

## 2023-12-22 NOTE — Telephone Encounter (Signed)
 Pt stated that the Robaxin  is not helping his pain at his left Scapula tat radiates to the front of his chest. I transferred his call to Dr Loni nurse.

## 2023-12-22 NOTE — Progress Notes (Signed)
 Patient's back pain has not been controlled with Robaxin . I have sent in a Rx of Gabapentin  to help with possible radiation-induced irritation.     Leeroy Due, PA-C

## 2023-12-22 NOTE — Progress Notes (Deleted)
 [name]

## 2023-12-26 ENCOUNTER — Inpatient Hospital Stay: Attending: Internal Medicine

## 2023-12-26 ENCOUNTER — Inpatient Hospital Stay

## 2023-12-26 ENCOUNTER — Inpatient Hospital Stay: Admitting: Internal Medicine

## 2023-12-26 VITALS — BP 128/71 | HR 89 | Temp 97.9°F | Resp 17 | Ht 67.0 in | Wt 195.9 lb

## 2023-12-26 DIAGNOSIS — Z8673 Personal history of transient ischemic attack (TIA), and cerebral infarction without residual deficits: Secondary | ICD-10-CM | POA: Diagnosis not present

## 2023-12-26 DIAGNOSIS — D84821 Immunodeficiency due to drugs: Secondary | ICD-10-CM | POA: Insufficient documentation

## 2023-12-26 DIAGNOSIS — I2510 Atherosclerotic heart disease of native coronary artery without angina pectoris: Secondary | ICD-10-CM | POA: Diagnosis not present

## 2023-12-26 DIAGNOSIS — K769 Liver disease, unspecified: Secondary | ICD-10-CM | POA: Diagnosis not present

## 2023-12-26 DIAGNOSIS — K219 Gastro-esophageal reflux disease without esophagitis: Secondary | ICD-10-CM | POA: Insufficient documentation

## 2023-12-26 DIAGNOSIS — G893 Neoplasm related pain (acute) (chronic): Secondary | ICD-10-CM | POA: Diagnosis not present

## 2023-12-26 DIAGNOSIS — C3412 Malignant neoplasm of upper lobe, left bronchus or lung: Secondary | ICD-10-CM

## 2023-12-26 DIAGNOSIS — I5032 Chronic diastolic (congestive) heart failure: Secondary | ICD-10-CM | POA: Diagnosis not present

## 2023-12-26 DIAGNOSIS — Z923 Personal history of irradiation: Secondary | ICD-10-CM | POA: Insufficient documentation

## 2023-12-26 DIAGNOSIS — D472 Monoclonal gammopathy: Secondary | ICD-10-CM | POA: Diagnosis not present

## 2023-12-26 DIAGNOSIS — Z5111 Encounter for antineoplastic chemotherapy: Secondary | ICD-10-CM | POA: Diagnosis present

## 2023-12-26 DIAGNOSIS — I130 Hypertensive heart and chronic kidney disease with heart failure and stage 1 through stage 4 chronic kidney disease, or unspecified chronic kidney disease: Secondary | ICD-10-CM | POA: Diagnosis not present

## 2023-12-26 DIAGNOSIS — Z79899 Other long term (current) drug therapy: Secondary | ICD-10-CM | POA: Diagnosis not present

## 2023-12-26 DIAGNOSIS — I700 Atherosclerosis of aorta: Secondary | ICD-10-CM | POA: Diagnosis not present

## 2023-12-26 DIAGNOSIS — E785 Hyperlipidemia, unspecified: Secondary | ICD-10-CM | POA: Diagnosis not present

## 2023-12-26 DIAGNOSIS — K580 Irritable bowel syndrome with diarrhea: Secondary | ICD-10-CM | POA: Insufficient documentation

## 2023-12-26 DIAGNOSIS — Z9221 Personal history of antineoplastic chemotherapy: Secondary | ICD-10-CM | POA: Insufficient documentation

## 2023-12-26 DIAGNOSIS — Z7902 Long term (current) use of antithrombotics/antiplatelets: Secondary | ICD-10-CM | POA: Insufficient documentation

## 2023-12-26 DIAGNOSIS — M1990 Unspecified osteoarthritis, unspecified site: Secondary | ICD-10-CM | POA: Insufficient documentation

## 2023-12-26 DIAGNOSIS — Z87891 Personal history of nicotine dependence: Secondary | ICD-10-CM | POA: Diagnosis not present

## 2023-12-26 DIAGNOSIS — D509 Iron deficiency anemia, unspecified: Secondary | ICD-10-CM | POA: Diagnosis not present

## 2023-12-26 LAB — CMP (CANCER CENTER ONLY)
ALT: 7 U/L (ref 0–44)
AST: 18 U/L (ref 15–41)
Albumin: 4 g/dL (ref 3.5–5.0)
Alkaline Phosphatase: 88 U/L (ref 38–126)
Anion gap: 10 (ref 5–15)
BUN: 11 mg/dL (ref 8–23)
CO2: 25 mmol/L (ref 22–32)
Calcium: 9.1 mg/dL (ref 8.9–10.3)
Chloride: 104 mmol/L (ref 98–111)
Creatinine: 1.26 mg/dL — ABNORMAL HIGH (ref 0.61–1.24)
GFR, Estimated: >60 mL/min (ref >60)
Glucose, Bld: 104 mg/dL — ABNORMAL HIGH (ref 70–99)
Potassium: 3.5 mmol/L (ref 3.5–5.1)
Sodium: 139 mmol/L (ref 135–145)
Total Bilirubin: <0.2 mg/dL (ref 0.0–1.2)
Total Protein: 7.5 g/dL (ref 6.5–8.1)

## 2023-12-26 LAB — CBC WITH DIFFERENTIAL (CANCER CENTER ONLY)
Abs Immature Granulocytes: 0.01 K/uL (ref 0.00–0.07)
Basophils Absolute: 0 K/uL (ref 0.0–0.1)
Basophils Relative: 1 %
Eosinophils Absolute: 0.3 K/uL (ref 0.0–0.5)
Eosinophils Relative: 7 %
HCT: 32.8 % — ABNORMAL LOW (ref 39.0–52.0)
Hemoglobin: 11.1 g/dL — ABNORMAL LOW (ref 13.0–17.0)
Immature Granulocytes: 0 %
Lymphocytes Relative: 17 %
Lymphs Abs: 0.8 K/uL (ref 0.7–4.0)
MCH: 30.4 pg (ref 26.0–34.0)
MCHC: 33.8 g/dL (ref 30.0–36.0)
MCV: 89.9 fL (ref 80.0–100.0)
Monocytes Absolute: 0.4 K/uL (ref 0.1–1.0)
Monocytes Relative: 9 %
Neutro Abs: 3.1 K/uL (ref 1.7–7.7)
Neutrophils Relative %: 66 %
Platelet Count: 285 K/uL (ref 150–400)
RBC: 3.65 MIL/uL — ABNORMAL LOW (ref 4.22–5.81)
RDW: 16.3 % — ABNORMAL HIGH (ref 11.5–15.5)
WBC Count: 4.7 K/uL (ref 4.0–10.5)
nRBC: 0 % (ref 0.0–0.2)

## 2023-12-26 MED ORDER — SODIUM CHLORIDE 0.9 % IV SOLN
1500.0000 mg | Freq: Once | INTRAVENOUS | Status: AC
  Administered 2023-12-26: 1500 mg via INTRAVENOUS
  Filled 2023-12-26: qty 30

## 2023-12-26 MED ORDER — SODIUM CHLORIDE 0.9 % IV SOLN: INTRAVENOUS | Status: DC

## 2023-12-26 NOTE — Progress Notes (Signed)
 [facility] [phone]   [phone]  OFFICE PROGRESS NOTE  Christopher Francisco, MD [address] [place] [address]  DIAGNOSIS:  Stage IIIA (T3, N1, M0) non-small cell lung cancer, adenocarcinoma presented with large left upper lobe lung mass in addition to left hilar lymphadenopathy diagnosed in August 2025.    Molecular Studies:  Biomarker Findings Tumor Mutational Burden - 13 Muts/Mb HRD signature - HRDsig Negative Microsatellite status - MS-Equivocal ? Genomic Findings For a complete list of the genes assayed, please refer to the Appendix. MET exon 14 splice site ([id number]), amplification CDK4 amplification MDM2 amplification PIK3CA [id number], [id number]   PDL1: 99%   PRIOR THERAPY: Concurrent chemoradiation with carboplatin  for an AUC of 2 and taxol  45 mg/m2. First dose on 09/19/23. Status post 5 cycles.  Last dose was given on 10/16/2023.   CURRENT THERAPY:  1) consolidation immunotherapy with durvalumab  1500 mg IV every 4 weeks.  Status post 1 cycle. 2) Fereheme 510 mg weekly x2. First dose on 09/26/23.   INTERVAL HISTORY: [name] 73 y.o. male returns to the clinic today for follow-up visit and his daughter [name] was available by phone.  Discussed the use of AI scribe software for clinical note transcription with the patient, who gave verbal consent to proceed.  History of Present Illness [name] is a 73 year old male with stage 3A non-small cell lung cancer who presents for follow-up and management of his condition.  He was diagnosed with stage 3A non-small cell lung cancer, adenocarcinoma, in August 2024, with a positive metaxone 14 splice mutation and PD1 expression of 99%. He is currently undergoing immunotherapy with durvalumab  following a course of concurrent chemotherapy with paclitaxel . He experiences fatigue and somnolence after the first cycle of immunotherapy, though not during the treatment itself. The sleepiness is  unpredictable, necessitating rest.  He has a history of iron deficiency anemia and has been on iron supplements since undergoing chemotherapy and radiation. His hemoglobin levels have improved from 8.6 to 11.1, though he remains anemic. No nausea, vomiting, or diarrhea, but he describes his stool as 'kind of lumpy, runny' and mentions difficulty having a 'real good bowel movement.' He has a diagnosis of IBS-D. His daughter notes that he has been on iron supplements since anemia was identified.  He experiences pain in the area of previous radiation, which is being investigated with a CT scan. He has been more active, walking more since his last visit, though the exact amount is unspecified.    MEDICAL HISTORY: Past Medical History:  Diagnosis Date   (HFpEF) heart failure with preserved ejection fraction (HCC)    Allergy    Anxiety    Arthritis    Chronic kidney disease    Depression    Frequent urination    GERD (gastroesophageal reflux disease)    Gout    Headache    mirgaines in the past   Herpes    History of nuclear stress test    Myoview  05/2021: EF 77, normal perfusion, low risk   Hyperlipidemia    Hypertension    IBS (irritable bowel syndrome)    Memory loss    MGUS (monoclonal gammopathy of unknown significance)    Obesity    Pre-diabetes    Stroke (HCC)    Urgency of urination     ALLERGIES:  is allergic to divalproex sodium, lamotrigine, valproic acid, amitriptyline, aspirin , hydrocodone , lisinopril, lithium, morphine , nsaids, oxycodone, oxycodone-acetaminophen , and pork allergy.  MEDICATIONS:  Current Outpatient Medications  Medication Sig Dispense Refill   acetaminophen  (TYLENOL ) 500 MG tablet Take 1-2 tablets (500-1,000 mg total) by mouth every 6 (six) hours as needed for moderate pain. 30 tablet 0   allopurinol  (ZYLOPRIM ) 100 MG tablet Take 150 mg by mouth daily.     amLODipine  (NORVASC ) 10 MG tablet Take 1 tablet (10 mg total) by mouth daily. 90 tablet 3    benzonatate  (TESSALON ) 200 MG capsule Take 1 capsule (200 mg total) by mouth in the morning and at bedtime. 60 capsule 2   busPIRone  (BUSPAR ) 30 MG tablet Take 30 mg by mouth 2 (two) times daily.     butalbital-acetaminophen -caffeine (FIORICET) 50-325-40 MG tablet Take 1 tablet by mouth every 6 (six) hours as needed for headache.     cetirizine  (ZYRTEC  ALLERGY) 10 MG tablet Take 1 tablet (10 mg total) by mouth daily. 30 tablet 0   [Paused] clopidogrel  (PLAVIX ) 75 MG tablet Take 1 tablet (75 mg total) by mouth daily. Okay to restart this medication on 08/22/2023.     dextromethorphan  (DELSYM ) 30 MG/5ML liquid Take 5 mLs (30 mg total) by mouth 2 (two) times daily as needed for cough. 89 mL 0   ferrous sulfate  325 (65 FE) MG EC tablet Take 1 tablet (325 mg total) by mouth daily with breakfast. 30 tablet 3   fluticasone  (FLONASE ) 50 MCG/ACT nasal spray Place 1 spray into both nostrils daily. 16 g 2   folic acid  (FOLVITE ) 1 MG tablet Take 1 mg by mouth daily.     furosemide  (LASIX ) 40 MG tablet Take 1 tablet (40 mg total) by mouth daily. 90 tablet 3   gabapentin  (NEURONTIN ) 300 MG capsule Take 1 capsule (300 mg total) by mouth 3 (three) times daily. 30 capsule 0   hydrALAZINE  (APRESOLINE ) 10 MG tablet Take 1 tablet (10 mg total) by mouth 2 (two) times daily. 180 tablet 3   HYDROcodone  bit-homatropine (HYCODAN) 5-1.5 MG/5ML syrup Take 5 mLs by mouth every 4 (four) hours as needed (Persistent cough). 120 mL 0   hydrocortisone cream 1 % Apply 1 Application topically.     Hydroxychloroquine  Sulfate 400 MG TABS Take 400 mg by mouth at bedtime.     ketoconazole (NIZORAL) 2 % cream Apply 1 Application topically daily.     Lactobacillus (ACIDOPHILUS) CAPS capsule Take 1 capsule by mouth daily. 100 capsule 0   lidocaine  (LIDODERM ) 5 % Place 1 patch onto the skin daily as needed (for pain- Remove & Discard patch within 12 hours or as directed by MD).     lidocaine  (XYLOCAINE ) 5 % ointment Apply 1 Application  topically as needed. 35.44 g 0   loperamide (IMODIUM) 2 MG capsule 2 mg as needed.     methocarbamol  (ROBAXIN ) 500 MG tablet Take 1 tablet (500 mg total) by mouth 2 (two) times daily. 20 tablet 0   Multiple Vitamin (MULTIVITAMIN) TABS TAKE 1 TABLET BY MOUTH DAILY. 27 tablet 1   PARoxetine  (PAXIL -CR) 37.5 MG 24 hr tablet Take 1 tablet (37.5 mg total) by mouth at bedtime.     primidone  (MYSOLINE ) 250 MG tablet TAKE 1 TABLET (250 MG TOTAL) BY MOUTH 2 (TWO) TIMES DAILY. 60 tablet 3   QUEtiapine  (SEROQUEL ) 25 MG tablet Take 25 mg by mouth at bedtime.     rosuvastatin  (CRESTOR ) 40 MG tablet Take 40 mg by mouth daily.     traZODone  (DESYREL ) 100 MG tablet Take 100 mg by mouth at bedtime.  valACYclovir  (VALTREX ) 1000 MG tablet Take 1,000 mg by mouth daily.     Vitamin D , Ergocalciferol , (DRISDOL ) 1.25 MG (50000 UNIT) CAPS capsule Take 50,000 Units by mouth every Saturday.     Wound Cleansers (RADIAPLEX EX) Apply topically as needed.     No current facility-administered medications for this visit.    SURGICAL HISTORY:  Past Surgical History:  Procedure Laterality Date   CHOLECYSTECTOMY N/A 12/17/2019   Procedure: LAPAROSCOPIC CHOLECYSTECTOMY WITH ICG INJECTION;  Surgeon: Ebbie Cough, MD;  Location: [facility] OR;  Service: General;  Laterality: N/A;   disckec     IR IMAGING GUIDED PORT INSERTION  09/12/2023   shouler arthroscopy Left    SPINAL FUSION     TONSILLECTOMY     TOTAL KNEE ARTHROPLASTY Right 11/15/2021   Procedure: TOTAL KNEE ARTHROPLASTY;  Surgeon: Melodi Lerner, MD;  Location: [facility];  Service: Orthopedics;  Laterality: Right;   VIDEO BRONCHOSCOPY WITH ENDOBRONCHIAL NAVIGATION Left 08/21/2023   Procedure: VIDEO BRONCHOSCOPY WITH ENDOBRONCHIAL NAVIGATION;  Surgeon: Shelah Lamar RAMAN, MD;  Location: [facility] ENDOSCOPY;  Service: Pulmonary;  Laterality: Left;   VIDEO BRONCHOSCOPY WITH ENDOBRONCHIAL ULTRASOUND  08/21/2023   Procedure: BRONCHOSCOPY, WITH EBUS;  Surgeon: Shelah Lamar RAMAN, MD;   Location: [facility]OSCOPY;  Service: Pulmonary;;   VIDEO BRONCHOSCOPY WITH RADIAL ENDOBRONCHIAL ULTRASOUND  08/21/2023   Procedure: VIDEO BRONCHOSCOPY WITH RADIAL ENDOBRONCHIAL ULTRASOUND;  Surgeon: Shelah Lamar RAMAN, MD;  Location: [facility] ENDOSCOPY;  Service: Pulmonary;;   WISDOM TOOTH EXTRACTION      REVIEW OF SYSTEMS:  Constitutional: positive for fatigue Eyes: negative Ears, nose, mouth, throat, and face: negative Respiratory: positive for cough Cardiovascular: negative Gastrointestinal: positive for diarrhea Genitourinary:negative Integument/breast: negative Hematologic/lymphatic: negative Musculoskeletal:negative Neurological: negative Behavioral/Psych: negative Endocrine: negative Allergic/Immunologic: negative   PHYSICAL EXAMINATION: General appearance: alert, cooperative, fatigued, and no distress Head: Normocephalic, without obvious abnormality, atraumatic Neck: no adenopathy, no JVD, supple, symmetrical, trachea midline, and thyroid  not enlarged, symmetric, no tenderness/mass/nodules Lymph nodes: Cervical, supraclavicular, and axillary nodes normal. Resp: clear to auscultation bilaterally Back: symmetric, no curvature. ROM normal. No CVA tenderness. Cardio: regular rate and rhythm, S1, S2 normal, no murmur, click, rub or gallop GI: soft, non-tender; bowel sounds normal; no masses,  no organomegaly Extremities: extremities normal, atraumatic, no cyanosis or edema Neurologic: Alert and oriented X 3, normal strength and tone. Normal symmetric reflexes. Normal coordination and gait  ECOG PERFORMANCE STATUS: 1 - Symptomatic but completely ambulatory  Blood pressure 128/71, pulse 89, temperature 97.9 F (36.6 C), temperature source Temporal, resp. rate 17, height 5' 7 (1.702 m), weight 195 lb 14.4 oz (88.9 kg), SpO2 99%.  LABORATORY DATA: Lab Results  Component Value Date   WBC 4.7 12/26/2023   HGB 11.1 (L) 12/26/2023   HCT 32.8 (L) 12/26/2023   MCV 89.9 12/26/2023   PLT 285  12/26/2023      Chemistry      Component Value Date/Time   NA 139 12/26/2023 1113   NA 140 07/27/2023 1454   K 3.5 12/26/2023 1113   CL 104 12/26/2023 1113   CO2 25 12/26/2023 1113   BUN 11 12/26/2023 1113   BUN 15 07/27/2023 1454   CREATININE 1.26 (H) 12/26/2023 1113      Component Value Date/Time   CALCIUM  9.1 12/26/2023 1113   ALKPHOS 88 12/26/2023 1113   AST 18 12/26/2023 1113   ALT 7 12/26/2023 1113   BILITOT <0.2 12/26/2023 1113       RADIOGRAPHIC STUDIES: No results found.    ASSESSMENT  AND PLAN: This is a 73 years old African-American male with Stage IIIA (T3, N1, M0) non-small cell lung cancer, adenocarcinoma presented with large left upper lobe lung mass in addition to left hilar lymphadenopathy diagnosed in August 2025.  Molecular study showed positive MET exon 14 splice mutation and PD-L1 expression of 99%. He underwent a course of concurrent chemoradiation with weekly carboplatin  for AUC of 2 and paclitaxel  45 mg/M2.  Status post 5 cycles.  Last dose of treatment was given on 09/26/2023.  He tolerated the treatment well except for the increasing fatigue and cough.  He is currently undergoing consolidation treatment with immunotherapy with nivolumab 1500 mg IV every 4 weeks status post 1 cycle. He tolerated the first cycle of his treatment fairly well. Assessment and Plan Assessment & Plan Stage 3A non-small cell lung adenocarcinoma Positive MET exon 14 splice mutation and PD-L1 expression of 99%. Currently on immunotherapy with durvalumab . No significant side effects from immunotherapy except for fatigue, likely related to anemia rather than the treatment itself. No nausea, vomiting, or diarrhea reported. Memory issues likely due to baseline cognitive function rather than treatment effects. Immune compromised due to cancer, not immunotherapy. - Continue durvalumab  immunotherapy - Proceed with second cycle of immunotherapy today - Ordered CT scan to evaluate chest  wall pain - Referred to palliative care for knee pain management  Iron deficiency anemia Hemoglobin at 11.1, improved from previous levels but still below normal range for men. Fatigue likely related to anemia. Iron supplementation ongoing since chemotherapy and radiation. - Continue iron supplementation with vitamin C or orange juice to enhance absorption  Irritable bowel syndrome with diarrhea IBS with diarrhea, currently experiencing clumpy and runny stools. Iron supplementation may exacerbate symptoms, but IBS is likely the primary cause.  Chest wall pain post-radiation Chest wall pain possibly due to radiation-induced inflammation affecting muscle and bone. Previous scans did not reveal a clear cause. - Ordered CT scan to evaluate chest wall pain - Referred to palliative care for knee pain management The patient was advised to call immediately if he has any concerning symptoms in the interval.  The patient voices understanding of current disease status and treatment options and is in agreement with the current care plan.  All questions were answered. The patient knows to call the clinic with any problems, questions or concerns. We can certainly see the patient much sooner if necessary. The total time spent in the appointment was 30 minutes including review of chart and various tests results, discussions about plan of care and coordination of care plan .   Disclaimer: This note was dictated with voice recognition software. Similar sounding words can inadvertently be transcribed and may not be corrected upon review.

## 2023-12-26 NOTE — Patient Instructions (Signed)
 [facility] - A DEPT OF [facility]  Discharge Instructions: Thank you for choosing [facility] to provide your oncology and hematology care.   If you have a lab appointment with the Cancer Center, please go directly to the Cancer Center and check in at the registration area.   Wear comfortable clothing and clothing appropriate for easy access to any Portacath or PICC line.   We strive to give you quality time with your provider. You may need to reschedule your appointment if you arrive late (15 or more minutes).  Arriving late affects you and other patients whose appointments are after yours.  Also, if you miss three or more appointments without notifying the office, you may be dismissed from the clinic at the provider's discretion.      For prescription refill requests, have your pharmacy contact our office and allow 72 hours for refills to be completed.    Today you received the following chemotherapy and/or immunotherapy agents: Durvalumab       To help prevent nausea and vomiting after your treatment, we encourage you to take your nausea medication as directed.  BELOW ARE SYMPTOMS THAT SHOULD BE REPORTED IMMEDIATELY: *FEVER GREATER THAN 100.4 F (38 C) OR HIGHER *CHILLS OR SWEATING *NAUSEA AND VOMITING THAT IS NOT CONTROLLED WITH YOUR NAUSEA MEDICATION *UNUSUAL SHORTNESS OF BREATH *UNUSUAL BRUISING OR BLEEDING *URINARY PROBLEMS (pain or burning when urinating, or frequent urination) *BOWEL PROBLEMS (unusual diarrhea, constipation, pain near the anus) TENDERNESS IN MOUTH AND THROAT WITH OR WITHOUT PRESENCE OF ULCERS (sore throat, sores in mouth, or a toothache) UNUSUAL RASH, SWELLING OR PAIN  UNUSUAL VAGINAL DISCHARGE OR ITCHING   Items with * indicate a potential emergency and should be followed up as soon as possible or go to the Emergency Department if any problems should occur.  Please show the CHEMOTHERAPY ALERT CARD or  IMMUNOTHERAPY ALERT CARD at check-in to the Emergency Department and triage nurse.  Should you have questions after your visit or need to cancel or reschedule your appointment, please contact [facility] - A DEPT OF [facility]  Dept: [phone]  and follow the prompts.  Office hours are 8:00 a.m. to 4:30 p.m. Monday - Friday. Please note that voicemails left after 4:00 p.m. may not be returned until the following business day.  We are closed weekends and major holidays. You have access to a nurse at all times for urgent questions. Please call the main number to the clinic Dept: [phone] and follow the prompts.   For any non-urgent questions, you may also contact your provider using MyChart. We now offer e-Visits for anyone 18 and older to request care online for non-urgent symptoms. For details visit [email or web address].   Also download the MyChart app! Go to the app store, search "MyChart", open the app, select [facility], and log in with your MyChart username and password.

## 2023-12-29 ENCOUNTER — Ambulatory Visit (HOSPITAL_COMMUNITY): Admission: RE | Admit: 2023-12-29 | Discharge: 2023-12-29 | Attending: Radiology

## 2023-12-29 DIAGNOSIS — R071 Chest pain on breathing: Secondary | ICD-10-CM

## 2023-12-29 MED ORDER — IOHEXOL 300 MG/ML  SOLN
75.0000 mL | Freq: Once | INTRAMUSCULAR | Status: AC | PRN
  Administered 2023-12-29: 75 mL via INTRAVENOUS

## 2023-12-29 MED ORDER — SODIUM CHLORIDE (PF) 0.9 % IJ SOLN
INTRAMUSCULAR | Status: AC
  Filled 2023-12-29: qty 50

## 2024-01-01 NOTE — Progress Notes (Signed)
 [name] presents today via telephone for follow up post radiation to the lung and to review most recent scans from 12/29/23.  Lung Side: Left  Does the patient complain of any of the following: Pain:Still having muscular pain. He went for a massage but did not get any relief. Shortness of breath w/wo exertion: Shortness w/ exertion. Cough: Dry cough Hemoptysis: Denies Pain with swallowing: Denies Swallowing/choking concerns: Denies Appetite: Fair Energy Level: He has been more active, walking more since his last visit, though the exact amount is unspecified.  Post radiation skin Changes: Denies    Additional comments if applicable: Continue iron supplementation with vitamin C or orange juice to enhance absorption.   No vitals were taken for this visit

## 2024-01-01 NOTE — Progress Notes (Incomplete)
 Radiation Oncology         [phone] ________________________________  Name: [name] MRN: [id number]  Date: 01/02/2024  DOB: 1950-06-08  Follow-Up Visit Note  Outpatient  CC: [name], MD  [name], MD  Diagnosis and Prior Radiotherapy:  No diagnosis found.     Cancer Staging  Adenocarcinoma of upper lobe of left lung [facility]) Staging form: Lung, AJCC V9 - Clinical stage from 08/29/2023: Stage IIIA (cT3, cN1, cM0) - Signed by Izell Domino, MD on 08/29/2023 Stage prefix: Initial diagnosis - Pathologic: Stage IIIA (pT3, pN1, cM0) - Signed by Sherrod Sherrod, MD on 09/08/2023 Method of lymph node assessment: Clinical  ==========DELIVERED PLANS==========  First Treatment Date: 2023-09-06 Last Treatment Date: 2023-10-23   Plan Name: Lung_L Site: Lung, Left Technique: IMRT Mode: Photon Dose Per Fraction: 2 Gy Prescribed Dose (Delivered / Prescribed): 66 Gy / 66 Gy Prescribed Fxs (Delivered / Prescribed): 33 / 33  Stage IIIA (cT3, N1, M0) Adenocarcinoma, NSCLC, of the LUL; s/p chemoRT completed on 10/23/2023  CHIEF COMPLAINT: Here for follow-up and surveillance of NSCLC cancer  Narrative:  The patient returns for follow-up. He completed his treatment approximately 2 months ago.   Patient was last seen by us  12/05/2023. At that time, he noted left parascapular pain. Patient was seen in the ED prior to that visit and CXR showed no acute findings. Pain was attributed to possible radiation induced irritation. We recommended continued Robaxin  and lidocaine  patch for pain management.   He called our office with ongoing left sided chest pain that was very bothersome to him. Pain was not controlled with Robaxin . CT of the chest was ordered for further evaluation and a prescription of Gabapentin  was sent to help with pain.   CT of the chest on 12/29/2023 redomnstrated the LUL lung mass; a new small-to-moderate left pleural effusion; a new sub 4 mm noncalcified nodule  in the RLL; and redemonstration of multiple ill-defined hypoattenuating liver lesions.     ALLERGIES:  is allergic to divalproex sodium, lamotrigine, valproic acid, amitriptyline, aspirin , hydrocodone , lisinopril, lithium, morphine , nsaids, oxycodone, oxycodone-acetaminophen , and pork allergy.  Meds: Current Outpatient Medications  Medication Sig Dispense Refill   acetaminophen  (TYLENOL ) 500 MG tablet Take 1-2 tablets (500-1,000 mg total) by mouth every 6 (six) hours as needed for moderate pain. 30 tablet 0   allopurinol  (ZYLOPRIM ) 100 MG tablet Take 150 mg by mouth daily.     amLODipine  (NORVASC ) 10 MG tablet Take 1 tablet (10 mg total) by mouth daily. 90 tablet 3   benzonatate  (TESSALON ) 200 MG capsule Take 1 capsule (200 mg total) by mouth in the morning and at bedtime. 60 capsule 2   busPIRone  (BUSPAR ) 30 MG tablet Take 30 mg by mouth 2 (two) times daily.     butalbital-acetaminophen -caffeine (FIORICET) 50-325-40 MG tablet Take 1 tablet by mouth every 6 (six) hours as needed for headache.     cetirizine  (ZYRTEC  ALLERGY) 10 MG tablet Take 1 tablet (10 mg total) by mouth daily. 30 tablet 0   [Paused] clopidogrel  (PLAVIX ) 75 MG tablet Take 1 tablet (75 mg total) by mouth daily. Okay to restart this medication on 08/22/2023.     dextromethorphan  (DELSYM ) 30 MG/5ML liquid Take 5 mLs (30 mg total) by mouth 2 (two) times daily as needed for cough. 89 mL 0   ferrous sulfate  325 (65 FE) MG EC tablet Take 1 tablet (325 mg total) by mouth daily with breakfast. 30 tablet 3   fluticasone  (FLONASE ) 50 MCG/ACT nasal  spray Place 1 spray into both nostrils daily. 16 g 2   folic acid  (FOLVITE ) 1 MG tablet Take 1 mg by mouth daily.     furosemide  (LASIX ) 40 MG tablet Take 1 tablet (40 mg total) by mouth daily. 90 tablet 3   gabapentin  (NEURONTIN ) 300 MG capsule Take 1 capsule (300 mg total) by mouth 3 (three) times daily. 30 capsule 0   hydrALAZINE  (APRESOLINE ) 10 MG tablet Take 1 tablet (10 mg total) by mouth  2 (two) times daily. 180 tablet 3   HYDROcodone  bit-homatropine (HYCODAN) 5-1.5 MG/5ML syrup Take 5 mLs by mouth every 4 (four) hours as needed (Persistent cough). 120 mL 0   hydrocortisone cream 1 % Apply 1 Application topically.     Hydroxychloroquine  Sulfate 400 MG TABS Take 400 mg by mouth at bedtime.     ketoconazole (NIZORAL) 2 % cream Apply 1 Application topically daily.     Lactobacillus (ACIDOPHILUS) CAPS capsule Take 1 capsule by mouth daily. 100 capsule 0   lidocaine  (LIDODERM ) 5 % Place 1 patch onto the skin daily as needed (for pain- Remove & Discard patch within 12 hours or as directed by MD).     lidocaine  (XYLOCAINE ) 5 % ointment Apply 1 Application topically as needed. 35.44 g 0   loperamide (IMODIUM) 2 MG capsule 2 mg as needed.     methocarbamol  (ROBAXIN ) 500 MG tablet Take 1 tablet (500 mg total) by mouth 2 (two) times daily. 20 tablet 0   Multiple Vitamin (MULTIVITAMIN) TABS TAKE 1 TABLET BY MOUTH DAILY. 27 tablet 1   PARoxetine  (PAXIL -CR) 37.5 MG 24 hr tablet Take 1 tablet (37.5 mg total) by mouth at bedtime.     primidone  (MYSOLINE ) 250 MG tablet TAKE 1 TABLET (250 MG TOTAL) BY MOUTH 2 (TWO) TIMES DAILY. 60 tablet 3   QUEtiapine  (SEROQUEL ) 25 MG tablet Take 25 mg by mouth at bedtime.     rosuvastatin  (CRESTOR ) 40 MG tablet Take 40 mg by mouth daily.     traZODone  (DESYREL ) 100 MG tablet Take 100 mg by mouth at bedtime.     valACYclovir  (VALTREX ) 1000 MG tablet Take 1,000 mg by mouth daily.     Vitamin D , Ergocalciferol , (DRISDOL ) 1.25 MG (50000 UNIT) CAPS capsule Take 50,000 Units by mouth every Saturday.     Wound Cleansers (RADIAPLEX EX) Apply topically as needed.     No current facility-administered medications for this visit.    Physical Findings: The patient is in no acute distress. Patient is alert and oriented.  vitals were not taken for this visit. .    General: Alert and oriented, in no acute distress HEENT: Head is normocephalic. Extraocular movements are  intact.  Neck: Neck is supple, no palpable cervical or supraclavicular lymphadenopathy. Heart: Regular in rate and rhythm with no murmurs, rubs, or gallops. Chest: Clear to auscultation bilaterally, with no rhonchi, wheezes, or rales. Abdomen: Soft, nontender, nondistended, with no rigidity or guarding. Extremities: No cyanosis or edema. Lymphatics: see Neck Exam Skin: Hyperpigmentation within the treatment field, consistent with treatment changes.  Musculoskeletal: symmetric strength and muscle tone throughout. No tenderness to palpation along the left side of the back.  Neurologic: Cranial nerves II through XII are grossly intact. No obvious focalities. Speech is fluent. Coordination is intact. Psychiatric: Judgment and insight are intact. Affect is appropriate.    Lab Findings: Lab Results  Component Value Date   WBC 4.7 12/26/2023   HGB 11.1 (L) 12/26/2023   HCT 32.8 (L) 12/26/2023  MCV 89.9 12/26/2023   PLT 285 12/26/2023    Radiographic Findings: CT Chest W Contrast Result Date: 12/31/2023 CLINICAL DATA:  Chest pain, nonspecific. History of non-small cell lung carcinoma. * Tracking Code: BO * EXAM: CT CHEST WITH CONTRAST TECHNIQUE: Multidetector CT imaging of the chest was performed during intravenous contrast administration. RADIATION DOSE REDUCTION: This exam was performed according to the departmental dose-optimization program which includes automated exposure control, adjustment of the mA and/or kV according to patient size and/or use of iterative reconstruction technique. CONTRAST:  75mL OMNIPAQUE  IOHEXOL  300 MG/ML  SOLN COMPARISON:  CT scan chest from 11/14/2023. FINDINGS: Cardiovascular: Normal cardiac size. No pericardial effusion. No aortic aneurysm. There are coronary artery calcifications, in keeping with coronary artery disease. There are also mild peripheral atherosclerotic vascular calcifications of thoracic aorta and its major branches. Mediastinum/Nodes: Visualized  thyroid  gland appears grossly unremarkable. No solid / cystic mediastinal masses. The esophagus is nondistended precluding optimal assessment. No axillary, mediastinal or hilar lymphadenopathy by size criteria. Lungs/Pleura: The central tracheo-bronchial tree is patent. Redemonstration of previously seen mass in the left upper lobe however, there is increasing adjacent/peripheral airspace opacities. There is also new small-to-moderate left pleural effusion. Redemonstration of loss of fat planes between the mass and the left hilar vessels. Redemonstration of peripheral/subpleural ground-glass opacity in the right lung lower lobe (series 2, image 71), measuring approximately 9 x 9 mm, unchanged since the prior study, when remeasured in similar fashion. There is a stable subpleural sub 4 mm noncalcified nodule in the right lung lower lobe (series 2, image 96). However, there is a new sub 4 mm noncalcified nodule in the right lung lower lobe (series 2, image 91). No lung mass, consolidation or pleural effusion on the right. Upper Abdomen: Redemonstration of multiple ill-defined hypoattenuating liver lesions with largest in the left hepatic lobe, segment 3/4B measuring up to 3.7 x 3.9 cm. There are multiple simple cysts in visualized bilateral kidneys. Remaining visualized upper abdominal viscera within normal limits. Musculoskeletal: A CT Port-a-Cath is seen in the right upper chest wall with the catheter terminating in the cavo-atrial junction region. Visualized soft tissues of the chest wall are otherwise grossly unremarkable. No suspicious osseous lesions. There are mild to moderate multilevel degenerative changes in the visualized spine. IMPRESSION: 1. Redemonstration of left upper lobe lung mass however, there is increasing adjacent/peripheral airspace opacities, which is nonspecific. Differential diagnosis includes peripheral postobstructive pneumonia/atelectasis; however, interval worsening of the tumor remains  in the differential diagnosis. Short-term follow-up examination is recommended after the acute episode subsides. 2. New small-to-moderate left pleural effusion. 3. There is a new sub 4 mm noncalcified nodule in the right lung lower lobe. Other pre-existing lung nodules are stable. 4. Redemonstration of multiple ill-defined hypoattenuating liver lesions. 5. Multiple other nonacute observations, as described above. Aortic Atherosclerosis (ICD10-I70.0).  Head Electronically Signed   By: Ree Molt M.D.   On: 12/31/2023 16:04    Impression/Plan:  Stage IIIA (cT3, N1, M0) Adenocarcinoma, NSCLC, of the LUL  Patient continues to heal from the effects of his radiation treatment. We discussed the natural course of healing today.   ***  He will proceed with immunotherapy follow-up under the care of Dr. Sherrod. He is scheduled to see medical oncology on 01/23/2023.   He is scheduled to see palliative care on 01/16/2024.   Radiation follow-up PRN. We appreciate the opportunity to take part in this patient's care.    On date of service, in total, I spent 30 minutes on  this encounter. Patient was seen in person.  _____________________________________    Leeroy Due, PA-C

## 2024-01-02 ENCOUNTER — Encounter: Payer: Self-pay | Admitting: Radiology

## 2024-01-02 ENCOUNTER — Ambulatory Visit: Admission: RE | Admit: 2024-01-02 | Discharge: 2024-01-02 | Attending: Radiology | Admitting: Radiology

## 2024-01-03 ENCOUNTER — Telehealth: Payer: Self-pay | Admitting: *Deleted

## 2024-01-03 ENCOUNTER — Telehealth: Payer: Self-pay | Admitting: Radiology

## 2024-01-03 ENCOUNTER — Other Ambulatory Visit: Payer: Self-pay | Admitting: Radiology

## 2024-01-03 DIAGNOSIS — R071 Chest pain on breathing: Secondary | ICD-10-CM

## 2024-01-03 MED ORDER — GABAPENTIN 300 MG PO CAPS: 300.0000 mg | ORAL_CAPSULE | Freq: Three times a day (TID) | ORAL | 0 refills | Status: DC

## 2024-01-03 MED ORDER — GABAPENTIN 300 MG PO CAPS: 300.0000 mg | ORAL_CAPSULE | Freq: Two times a day (BID) | ORAL | 0 refills | Status: DC

## 2024-01-03 NOTE — Telephone Encounter (Addendum)
 I called the patient and his daughter to update him on my discussion with Dr. Izell. Out of abundance of caution, she recommends proceed with PET imaging caution to further evaluate CT findings. Mr. Dock mentions that he was experiencing left sided back pain despite taking his Gabapentin  today. He has been taking this BID. I encouraged him to take the Gabapentin  TID. He will let us  know if the pain is not well controlled when taking this TID. I will adjust his prescription to reflect this change. All questions were answered and the patient and his daughter are in agreement with the stated plan.     Leeroy Due, PA-C

## 2024-01-03 NOTE — Telephone Encounter (Signed)
 Called patient to inform of Pet Scan for 01-17-24- arrival time- 4 pm @ [facility], patient to have water  only- 6 hrs. prior to scan, patient to receive results from Dr. Izell 01-23-24 @ 3 pm, spoke with patient and he is aware of these appts. and the instructions

## 2024-01-04 ENCOUNTER — Telehealth: Payer: Self-pay

## 2024-01-04 NOTE — Telephone Encounter (Signed)
 [facility] called regarding medical clearance for pt. Clearance form given to Dr Sherrod.

## 2024-01-05 ENCOUNTER — Telehealth: Payer: Self-pay | Admitting: Medical Oncology

## 2024-01-05 ENCOUNTER — Ambulatory Visit: Admitting: Radiology

## 2024-01-05 NOTE — Addendum Note (Signed)
 Addended by: DAYNE SHERRY RAMAN on: 01/05/2024 10:33 AM   Modules accepted: Orders

## 2024-01-05 NOTE — Telephone Encounter (Signed)
 Received fax confirmation that Medical Clearance form was received by [facility].

## 2024-01-09 ENCOUNTER — Telehealth: Payer: Self-pay | Admitting: Medical Oncology

## 2024-01-09 NOTE — Telephone Encounter (Signed)
 Faxed labs and office note per Dr. Governor request.

## 2024-01-15 ENCOUNTER — Other Ambulatory Visit: Payer: Self-pay | Admitting: Physician Assistant

## 2024-01-15 DIAGNOSIS — D649 Anemia, unspecified: Secondary | ICD-10-CM

## 2024-01-15 NOTE — Progress Notes (Signed)
 [name] presents today for a follow up to review recent PET scan results. He completed radiation treatment for Adenocarcinoma of Upper Lobe of Left Lung on 10/23/2023.   PAIN: He is currently in no pain while taking pain medicine but if he is not taking it, he rates his pain at an 8. He has pain under his left scapula and sometimes in his ribs (left side).  RESPIRATORY: Cough is Dry. Pt is on room air.   SWALLOWING/DIET: Pt denies dysphagia. The patient eats a regular, healthy diet..  OTHER: Pt complains of fatigue and some shortness of breath on exertion.   There were no vitals taken for this visit.   Wt Readings from Last 3 Encounters:  12/26/23 195 lb 14.4 oz (88.9 kg)  12/05/23 198 lb 12.8 oz (90.2 kg)  11/29/23 197 lb (89.4 kg)

## 2024-01-16 ENCOUNTER — Inpatient Hospital Stay: Admitting: Nurse Practitioner

## 2024-01-16 ENCOUNTER — Encounter: Payer: Self-pay | Admitting: Nurse Practitioner

## 2024-01-16 VITALS — BP 133/79 | HR 85 | Temp 98.1°F | Resp 20 | Wt 201.6 lb

## 2024-01-16 DIAGNOSIS — G893 Neoplasm related pain (acute) (chronic): Secondary | ICD-10-CM | POA: Diagnosis not present

## 2024-01-16 DIAGNOSIS — Z515 Encounter for palliative care: Secondary | ICD-10-CM

## 2024-01-16 DIAGNOSIS — Z7189 Other specified counseling: Secondary | ICD-10-CM

## 2024-01-16 DIAGNOSIS — R071 Chest pain on breathing: Secondary | ICD-10-CM

## 2024-01-16 DIAGNOSIS — C3412 Malignant neoplasm of upper lobe, left bronchus or lung: Secondary | ICD-10-CM

## 2024-01-16 DIAGNOSIS — M792 Neuralgia and neuritis, unspecified: Secondary | ICD-10-CM

## 2024-01-16 DIAGNOSIS — R530 Neoplastic (malignant) related fatigue: Secondary | ICD-10-CM

## 2024-01-16 MED ORDER — LIDOCAINE 5 % EX PTCH: 1.0000 | MEDICATED_PATCH | Freq: Every day | CUTANEOUS | 3 refills | Status: AC | PRN

## 2024-01-16 MED ORDER — GABAPENTIN 300 MG PO CAPS: 300.0000 mg | ORAL_CAPSULE | Freq: Three times a day (TID) | ORAL | 0 refills | Status: DC

## 2024-01-16 NOTE — Progress Notes (Signed)
 "    Palliative Medicine [facility]  [phone] [phone]   Name: [name] Date: 01/16/2024 MRN: [id number]  DOB: 01-08-1951  Patient Care Team: [name], MD as PCP - General (Family Medicine) Santo Stanly LABOR, MD as PCP - Cardiology (Cardiology) Gearline Norris, MD as Consulting Physician (Nephrology) Pccm, Md, MD (Pulmonary Disease) Prentis Duwaine BROCKS, RN as Oncology Nurse Navigator Delynn Verdel RAMAN, MD as Referring Physician (Psychiatry) Sherrod Sherrod, MD as Consulting Physician (Oncology) Pickenpack-Cousar, Fannie SAILOR, NP as Nurse Practitioner (Hospice and Palliative Medicine)    REASON FOR CONSULTATION: [name] is a 73 y.o. male with oncologic medical history including stage IIIa non-small cell lung cancer with left hilar lymphadenopathy (08/2023) currently undergoing immunotherapy treatments every 4 weeks.  Palliative is seeing patient for symptom management and goals of care.    SOCIAL HISTORY:     reports that he quit smoking about 36 years ago. His smoking use included cigarettes. He has never used smokeless tobacco. He reports that he does not drink alcohol  and does not use drugs.  ADVANCE DIRECTIVES:  On file. His daughter, [name] is his clinical research associate.   CODE STATUS: Full code  PAST MEDICAL HISTORY: Past Medical History:  Diagnosis Date   (HFpEF) heart failure with preserved ejection fraction (HCC)    Allergy    Anxiety    Arthritis    Chronic kidney disease    Depression    Frequent urination    GERD (gastroesophageal reflux disease)    Gout    Headache    mirgaines in the past   Herpes    History of nuclear stress test    Myoview  05/2021: EF 77, normal perfusion, low risk   Hyperlipidemia    Hypertension    IBS (irritable bowel syndrome)    Memory loss    MGUS (monoclonal gammopathy of unknown significance)    Obesity    Pre-diabetes    Stroke (HCC)    Urgency of urination      PAST SURGICAL HISTORY:  Past Surgical History:  Procedure Laterality Date   CHOLECYSTECTOMY N/A 12/17/2019   Procedure: LAPAROSCOPIC CHOLECYSTECTOMY WITH ICG INJECTION;  Surgeon: Ebbie Cough, MD;  Location: [facility] OR;  Service: General;  Laterality: N/A;   disckec     IR IMAGING GUIDED PORT INSERTION  09/12/2023   shouler arthroscopy Left    SPINAL FUSION     TONSILLECTOMY     TOTAL KNEE ARTHROPLASTY Right 11/15/2021   Procedure: TOTAL KNEE ARTHROPLASTY;  Surgeon: Melodi Lerner, MD;  Location: [facility] ORS;  Service: Orthopedics;  Laterality: Right;   VIDEO BRONCHOSCOPY WITH ENDOBRONCHIAL NAVIGATION Left 08/21/2023   Procedure: VIDEO BRONCHOSCOPY WITH ENDOBRONCHIAL NAVIGATION;  Surgeon: Shelah Lamar RAMAN, MD;  Location: [facility] ENDOSCOPY;  Service: Pulmonary;  Laterality: Left;   VIDEO BRONCHOSCOPY WITH ENDOBRONCHIAL ULTRASOUND  08/21/2023   Procedure: BRONCHOSCOPY, WITH EBUS;  Surgeon: Shelah Lamar RAMAN, MD;  Location: [facility] ENDOSCOPY;  Service: Pulmonary;;   VIDEO BRONCHOSCOPY WITH RADIAL ENDOBRONCHIAL ULTRASOUND  08/21/2023   Procedure: VIDEO BRONCHOSCOPY WITH RADIAL ENDOBRONCHIAL ULTRASOUND;  Surgeon: Shelah Lamar RAMAN, MD;  Location: [facility] ENDOSCOPY;  Service: Pulmonary;;   WISDOM TOOTH EXTRACTION      HEMATOLOGY/ONCOLOGY HISTORY:  Oncology History  Adenocarcinoma of upper lobe of left lung (HCC)  08/29/2023 Initial Diagnosis   Adenocarcinoma of upper lobe of left lung (HCC)   08/29/2023 Cancer Staging   Staging form: Lung, AJCC V9 - Clinical stage from 08/29/2023: Stage IIIA (cT3, cN1,  cM0) - Signed by Izell Domino, MD on 08/29/2023 Stage prefix: Initial diagnosis   09/08/2023 Cancer Staging   Staging form: Lung, AJCC V9 - Pathologic: Stage IIIA (pT3, pN1, cM0) - Signed by Sherrod Sherrod, MD on 09/08/2023 Method of lymph node assessment: Clinical   09/19/2023 - 10/16/2023 Chemotherapy   Patient is on Treatment Plan : LUNG Carboplatin  + Paclitaxel  + XRT q7d     11/29/2023 -  Chemotherapy    Patient is on Treatment Plan : LUNG NSCLC Durvalumab  (1500) q28d       ALLERGIES:  is allergic to divalproex sodium, lamotrigine, valproic acid, amitriptyline, aspirin , hydrocodone , lisinopril, lithium, morphine , nsaids, oxycodone, oxycodone-acetaminophen , and pork allergy.  MEDICATIONS:  Current Outpatient Medications  Medication Sig Dispense Refill   acetaminophen  (TYLENOL ) 500 MG tablet Take 1-2 tablets (500-1,000 mg total) by mouth every 6 (six) hours as needed for moderate pain. 30 tablet 0   allopurinol  (ZYLOPRIM ) 100 MG tablet Take 150 mg by mouth daily.     amLODipine  (NORVASC ) 10 MG tablet Take 1 tablet (10 mg total) by mouth daily. 90 tablet 3   busPIRone  (BUSPAR ) 30 MG tablet Take 30 mg by mouth 2 (two) times daily.     butalbital-acetaminophen -caffeine (FIORICET) 50-325-40 MG tablet Take 1 tablet by mouth every 6 (six) hours as needed for headache.     cetirizine  (ZYRTEC  ALLERGY) 10 MG tablet Take 1 tablet (10 mg total) by mouth daily. 30 tablet 0   clopidogrel  (PLAVIX ) 75 MG tablet Take 1 tablet (75 mg total) by mouth daily. Okay to restart this medication on 08/22/2023.     dextromethorphan  (DELSYM ) 30 MG/5ML liquid Take 5 mLs (30 mg total) by mouth 2 (two) times daily as needed for cough. 89 mL 0   ferrous sulfate  (FEROSUL) 325 (65 FE) MG tablet TAKE 1 TABLET (325 MG TOTAL) BY MOUTH DAILY WITH BREAKFAST. 28 tablet 2   fluticasone  (FLONASE ) 50 MCG/ACT nasal spray Place 1 spray into both nostrils daily. 16 g 2   folic acid  (FOLVITE ) 1 MG tablet Take 1 mg by mouth daily.     furosemide  (LASIX ) 40 MG tablet Take 1 tablet (40 mg total) by mouth daily. 90 tablet 3   gabapentin  (NEURONTIN ) 300 MG capsule Take 1 capsule (300 mg total) by mouth 3 (three) times daily. 90 capsule 0   hydrALAZINE  (APRESOLINE ) 10 MG tablet Take 1 tablet (10 mg total) by mouth 2 (two) times daily. 180 tablet 3   HYDROcodone  bit-homatropine (HYCODAN) 5-1.5 MG/5ML syrup Take 5 mLs by mouth every 4 (four) hours as  needed (Persistent cough). 120 mL 0   hydrocortisone cream 1 % Apply 1 Application topically.     Hydroxychloroquine  Sulfate 400 MG TABS Take 400 mg by mouth at bedtime.     ketoconazole (NIZORAL) 2 % cream Apply 1 Application topically daily.     Lactobacillus (ACIDOPHILUS) CAPS capsule Take 1 capsule by mouth daily. 100 capsule 0   lidocaine  (LIDODERM ) 5 % Place 1 patch onto the skin daily as needed (for pain- Remove & Discard patch within 12 hours or as directed by MD). 30 patch 3   lidocaine  (XYLOCAINE ) 5 % ointment Apply 1 Application topically as needed. 35.44 g 0   loperamide (IMODIUM) 2 MG capsule 2 mg as needed.     methocarbamol  (ROBAXIN ) 500 MG tablet Take 1 tablet (500 mg total) by mouth 2 (two) times daily. 20 tablet 0   Multiple Vitamin (MULTIVITAMIN) TABS TAKE 1 TABLET BY MOUTH DAILY. 27 tablet  1   PARoxetine  (PAXIL -CR) 37.5 MG 24 hr tablet Take 1 tablet (37.5 mg total) by mouth at bedtime.     primidone  (MYSOLINE ) 250 MG tablet TAKE 1 TABLET (250 MG TOTAL) BY MOUTH 2 (TWO) TIMES DAILY. 60 tablet 3   QUEtiapine  (SEROQUEL ) 25 MG tablet Take 25 mg by mouth at bedtime.     rosuvastatin  (CRESTOR ) 40 MG tablet Take 40 mg by mouth daily.     traZODone  (DESYREL ) 100 MG tablet Take 100 mg by mouth at bedtime.     valACYclovir  (VALTREX ) 1000 MG tablet Take 1,000 mg by mouth daily.     Vitamin D , Ergocalciferol , (DRISDOL ) 1.25 MG (50000 UNIT) CAPS capsule Take 50,000 Units by mouth every Saturday.     Wound Cleansers (RADIAPLEX EX) Apply topically as needed.     No current facility-administered medications for this visit.    VITAL SIGNS: BP 133/79   Pulse 85   Temp 98.1 F (36.7 C)   Resp 20   Wt 201 lb 9.6 oz (91.4 kg)   SpO2 96%   BMI 31.58 kg/m  Filed Weights   01/16/24 1057  Weight: 201 lb 9.6 oz (91.4 kg)    Estimated body mass index is 31.58 kg/m as calculated from the following:   Height as of 12/26/23: 5' 7 (1.702 m).   Weight as of this encounter: 201 lb 9.6 oz  (91.4 kg).  LABS: CBC:    Component Value Date/Time   WBC 4.7 12/26/2023 1113   WBC 7.1 09/02/2023 0250   HGB 11.1 (L) 12/26/2023 1113   HCT 32.8 (L) 12/26/2023 1113   PLT 285 12/26/2023 1113   MCV 89.9 12/26/2023 1113   NEUTROABS 3.1 12/26/2023 1113   LYMPHSABS 0.8 12/26/2023 1113   MONOABS 0.4 12/26/2023 1113   EOSABS 0.3 12/26/2023 1113   BASOSABS 0.0 12/26/2023 1113   Comprehensive Metabolic Panel:    Component Value Date/Time   NA 139 12/26/2023 1113   NA 140 07/27/2023 1454   K 3.5 12/26/2023 1113   CL 104 12/26/2023 1113   CO2 25 12/26/2023 1113   BUN 11 12/26/2023 1113   BUN 15 07/27/2023 1454   CREATININE 1.26 (H) 12/26/2023 1113   GLUCOSE 104 (H) 12/26/2023 1113   CALCIUM  9.1 12/26/2023 1113   AST 18 12/26/2023 1113   ALT 7 12/26/2023 1113   ALKPHOS 88 12/26/2023 1113   BILITOT <0.2 12/26/2023 1113   PROT 7.5 12/26/2023 1113   PROT 7.3 05/29/2014 1503   ALBUMIN 4.0 12/26/2023 1113   ALBUMIN 4.7 05/29/2014 1503    RADIOGRAPHIC STUDIES: CT Chest W Contrast Result Date: 12/31/2023 CLINICAL DATA:  Chest pain, nonspecific. History of non-small cell lung carcinoma. * Tracking Code: BO * EXAM: CT CHEST WITH CONTRAST TECHNIQUE: Multidetector CT imaging of the chest was performed during intravenous contrast administration. RADIATION DOSE REDUCTION: This exam was performed according to the departmental dose-optimization program which includes automated exposure control, adjustment of the mA and/or kV according to patient size and/or use of iterative reconstruction technique. CONTRAST:  75mL OMNIPAQUE  IOHEXOL  300 MG/ML  SOLN COMPARISON:  CT scan chest from 11/14/2023. FINDINGS: Cardiovascular: Normal cardiac size. No pericardial effusion. No aortic aneurysm. There are coronary artery calcifications, in keeping with coronary artery disease. There are also mild peripheral atherosclerotic vascular calcifications of thoracic aorta and its major branches. Mediastinum/Nodes:  Visualized thyroid  gland appears grossly unremarkable. No solid / cystic mediastinal masses. The esophagus is nondistended precluding optimal assessment. No axillary, mediastinal or hilar  lymphadenopathy by size criteria. Lungs/Pleura: The central tracheo-bronchial tree is patent. Redemonstration of previously seen mass in the left upper lobe however, there is increasing adjacent/peripheral airspace opacities. There is also new small-to-moderate left pleural effusion. Redemonstration of loss of fat planes between the mass and the left hilar vessels. Redemonstration of peripheral/subpleural ground-glass opacity in the right lung lower lobe (series 2, image 71), measuring approximately 9 x 9 mm, unchanged since the prior study, when remeasured in similar fashion. There is a stable subpleural sub 4 mm noncalcified nodule in the right lung lower lobe (series 2, image 96). However, there is a new sub 4 mm noncalcified nodule in the right lung lower lobe (series 2, image 91). No lung mass, consolidation or pleural effusion on the right. Upper Abdomen: Redemonstration of multiple ill-defined hypoattenuating liver lesions with largest in the left hepatic lobe, segment 3/4B measuring up to 3.7 x 3.9 cm. There are multiple simple cysts in visualized bilateral kidneys. Remaining visualized upper abdominal viscera within normal limits. Musculoskeletal: A CT Port-a-Cath is seen in the right upper chest wall with the catheter terminating in the cavo-atrial junction region. Visualized soft tissues of the chest wall are otherwise grossly unremarkable. No suspicious osseous lesions. There are mild to moderate multilevel degenerative changes in the visualized spine. IMPRESSION: 1. Redemonstration of left upper lobe lung mass however, there is increasing adjacent/peripheral airspace opacities, which is nonspecific. Differential diagnosis includes peripheral postobstructive pneumonia/atelectasis; however, interval worsening of the  tumor remains in the differential diagnosis. Short-term follow-up examination is recommended after the acute episode subsides. 2. New small-to-moderate left pleural effusion. 3. There is a new sub 4 mm noncalcified nodule in the right lung lower lobe. Other pre-existing lung nodules are stable. 4. Redemonstration of multiple ill-defined hypoattenuating liver lesions. 5. Multiple other nonacute observations, as described above. Aortic Atherosclerosis (ICD10-I70.0).  Head Electronically Signed   By: Ree Molt M.D.   On: 12/31/2023 16:04    PERFORMANCE STATUS (ECOG) : 1 - Symptomatic but completely ambulatory  Review of Systems  Constitutional:  Positive for fatigue.  Musculoskeletal:  Positive for arthralgias and back pain.  Unless otherwise noted, a complete review of systems is negative.  Physical Exam General: NAD Cardiovascular: regular rate and rhythm Pulmonary: clear ant fields Abdomen: soft, nontender, + bowel sounds Extremities: no edema, no joint deformities Skin: no rashes Neurological: Alert and oriented x3  Discussed the use of AI scribe software for clinical note transcription with the patient, who gave verbal consent to proceed.  History of Present Illness [name] is a 73 year old male with non-small cell lung adenocarcinoma who presents to clinic today for his initial palliative visit. His daughter, Miss [name], is present via speaker phone. No acute distress. He is alert and able to engage appropriately discussions.   I introduced myself, Maygan RN, and Palliative's role in collaboration with the oncology team. Concept of Palliative Care was introduced as specialized medical care for people and their families living with serious illness.  It focuses on providing relief from the symptoms and stress of a serious illness.  The goal is to improve quality of life for both the patient and the family. Values and goals of care important to patient and family were attempted  to be elicited.  Mr. Sigal currently resides in [facility] independent living facility. His daughter [name], is his HCPOA and main caregiver. She allows him to have some independence best possible. 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   At home he is able to perform most ADLs independently. He lives in an and values his independence, though he acknowledges needing assistance with certain tasks, such as applying lidocaine  patches. He wants to manage his medications and appointments independently, though his daughter is involved in his care and medication management.  He reports issues with bowel movements at times, needing to strain followed by loose stools. He has known IBS-D. Appetite is good. Some days better than others. Weight stable at 201lbs. Denies concerns for nausea or vomiting.   Mr. Biegel experiences persistent left upper scapula discomfort that radiates to lower back pain, around his left side area, to chest wall. States discomfort is persistent and can be intense at times. It is unaffected by activity or rest. He uses lidocaine  patches and cream for pain relief but finds them difficult to apply independently. Education provided on patch application as he reports they fall off.   We discussed his home regimen at length as he and daughter would like to minimize medications if possible and be mindful of his ability to remain as functional as possible. Mr. Willmore is taking his Gabapentin  is used inconsistently, endorsing he may take twice daily. He finds it difficult at times to take three times a day as he forget during the day. We discussed use and taking consistently to maximize benefits of medication. We settled on a schedule of 10am, 4pm, 10am based on his medication schedule and awake cycle. He has Robaxin  as needed for spasms. He has not taken out of concern when to take. Education provided on administration,  efficacy, and safety of use. He and daughter verbalized understanding. No adjustments to medication regimen at this time allowing for PET scan tomorrow and evaluating effectiveness of gabapentin  once taking as prescribed. Patient and daughter verbalized understanding.   Goals of Care We discussed Mr. Dennen's current illness and what it means in the larger context of his on-going co-morbidities. Natural disease trajectory and expectations were discussed.  Patient and daughter are realistic in their understanding of cancer diagnosis and plan of care. He remains hopeful for continued stability. Clear in expressed wishes to treat the treatable allowing him an opportunity to continue living.   We empathetically discussed his code status, goals of care, and advanced directives.  Mr. Hannen currently has advanced directives on file.  Documents have been reviewed with no necessary changes needed.  His daughter [name] is his documented medical decision-maker in the event he is unable to speak for himself.  Patient expressed wishes to remain a full code however with limitations in the setting of terminal, incurable, or no meaningful recovery which he would then desire a natural death with no life-sustaining measures.  He relies on his daughter to make decisions based on current state of health at that time.  Would not wish to be on artificial long-term feedings.  I discussed the importance of continued conversation with family and their medical providers regarding overall plan of care and treatment options, ensuring decisions are within the context of the patients values and GOCs. Assessment & Plan Established therapeutic relationship. Education provided on palliative's role in collaboration with their Oncology/Radiation team.  Malignant neoplasm of upper lobe of left lung Malignant neoplasm of the upper lobe of the left lung, currently undergoing immunotherapy. The mass remains non-operable. PET scan  scheduled to assess current status and guide further management. - PET scan scheduled for December 31st at 3:45 PM -  Continue immunotherapy as planned per oncology.  Cancer-related pain Chronic cancer-related pain primarily in the lower back radiating to the front chest wall, exacerbated by physical activity. Current pain management includes gabapentin  and lidocaine  patches. Gabapentin  is not being taken consistently, and lidocaine  patches are not applied correctly, leading to suboptimal pain control. Emphasis on consistent medication use and proper application of patches to improve pain management. - Instructed to take gabapentin  300 mg three times daily consistently.  We discussed states schedule of 10 AM, 4 PM, 10 PM. - Instructed to apply lidocaine  patches correctly, ensuring all sides are flush to the skin - Instructed to use lidocaine  cream as needed for temporary relief - Instructed to coordinate with caregiver for assistance with patch application if needed - Sent refills for gabapentin  and lidocaine  patches to [employer] - Scheduled follow-up appointment on January 6th to assess pain management efficacy  Goals of care Patient and daughter are realistic and their understanding of his current cancer state and plan of care.  They are remaining hopeful for continued stability. - Patient has a documented advanced directive on file.  Document has been reviewed.  No changes needed at this time.  His daughter [name], is his documented medical decision-maker in the event he is unable to speak for himself. - Patient request to remain a full code at this time.  However he does have limitations in the setting of terminal, incurable, or no meaningful recovery which he would then desire a natural death with no life-sustaining measures.  He relies on his daughter to make decisions based on current state of health at that time.  Would not wish to be on artificial long-term feedings.  Follow-up I  will plan to see patient back on January 6 during infusion for close symptom management follow-up.  Sooner if needed.  Patient expressed understanding and was in agreement with this plan. He also understands that He can call the clinic at any time with any questions, concerns, or complaints.   Thank you for your referral and allowing Palliative to assist in Mr. Ly L Greenfield's care.   Number and complexity of problems addressed: HIGH - 1 or more chronic illnesses with SEVERE exacerbation, progression, or side effects of treatment - advanced cancer, pain. Any controlled substances utilized were prescribed in the context of palliative care.  I personally spent a total of 75 minutes in the care of the patient today including preparing to see the patient, getting/reviewing separately obtained history, performing a medically appropriate exam/evaluation, counseling and educating, placing orders, referring and communicating with other health care professionals, documenting clinical information in the EHR, independently interpreting results, and coordinating care. Visit consisted of counseling and education dealing with the complex and emotionally intense issues of symptom management and palliative care in the setting of serious and potentially life-threatening illness.  Signed by: Levon Borer, AGPCNP-BC Palliative Medicine Team/[facility]    "

## 2024-01-17 ENCOUNTER — Ambulatory Visit (HOSPITAL_COMMUNITY): Admission: RE | Admit: 2024-01-17 | Source: Ambulatory Visit

## 2024-01-17 DIAGNOSIS — C3412 Malignant neoplasm of upper lobe, left bronchus or lung: Secondary | ICD-10-CM | POA: Diagnosis present

## 2024-01-17 LAB — GLUCOSE, CAPILLARY: Glucose-Capillary: 95 mg/dL (ref 70–99)

## 2024-01-17 MED ORDER — FLUDEOXYGLUCOSE F - 18 (FDG) INJECTION
10.0400 | Freq: Once | INTRAVENOUS | Status: AC | PRN
  Administered 2024-01-17: 10.04 via INTRAVENOUS

## 2024-01-23 ENCOUNTER — Inpatient Hospital Stay: Attending: Internal Medicine

## 2024-01-23 ENCOUNTER — Encounter: Payer: Self-pay | Admitting: Nurse Practitioner

## 2024-01-23 ENCOUNTER — Telehealth: Payer: Self-pay | Admitting: Medical Oncology

## 2024-01-23 ENCOUNTER — Ambulatory Visit
Admission: RE | Admit: 2024-01-23 | Discharge: 2024-01-23 | Disposition: A | Discharge status: Home / Self Care | Source: Ambulatory Visit | Attending: Radiation Oncology | Admitting: Radiation Oncology

## 2024-01-23 ENCOUNTER — Inpatient Hospital Stay (HOSPITAL_BASED_OUTPATIENT_CLINIC_OR_DEPARTMENT_OTHER): Admitting: Internal Medicine

## 2024-01-23 ENCOUNTER — Inpatient Hospital Stay: Admitting: Nurse Practitioner

## 2024-01-23 ENCOUNTER — Inpatient Hospital Stay

## 2024-01-23 VITALS — BP 131/76 | HR 90 | Temp 97.8°F | Resp 17 | Ht 67.0 in | Wt 203.8 lb

## 2024-01-23 DIAGNOSIS — C3412 Malignant neoplasm of upper lobe, left bronchus or lung: Secondary | ICD-10-CM

## 2024-01-23 DIAGNOSIS — I700 Atherosclerosis of aorta: Secondary | ICD-10-CM | POA: Insufficient documentation

## 2024-01-23 DIAGNOSIS — J90 Pleural effusion, not elsewhere classified: Secondary | ICD-10-CM | POA: Insufficient documentation

## 2024-01-23 DIAGNOSIS — Z9221 Personal history of antineoplastic chemotherapy: Secondary | ICD-10-CM | POA: Insufficient documentation

## 2024-01-23 DIAGNOSIS — C3490 Malignant neoplasm of unspecified part of unspecified bronchus or lung: Secondary | ICD-10-CM

## 2024-01-23 DIAGNOSIS — Z87891 Personal history of nicotine dependence: Secondary | ICD-10-CM

## 2024-01-23 DIAGNOSIS — Z923 Personal history of irradiation: Secondary | ICD-10-CM | POA: Diagnosis not present

## 2024-01-23 DIAGNOSIS — Z5112 Encounter for antineoplastic immunotherapy: Secondary | ICD-10-CM | POA: Diagnosis not present

## 2024-01-23 DIAGNOSIS — R530 Neoplastic (malignant) related fatigue: Secondary | ICD-10-CM

## 2024-01-23 DIAGNOSIS — K589 Irritable bowel syndrome without diarrhea: Secondary | ICD-10-CM | POA: Insufficient documentation

## 2024-01-23 DIAGNOSIS — G893 Neoplasm related pain (acute) (chronic): Secondary | ICD-10-CM

## 2024-01-23 DIAGNOSIS — Z515 Encounter for palliative care: Secondary | ICD-10-CM | POA: Diagnosis not present

## 2024-01-23 DIAGNOSIS — N281 Cyst of kidney, acquired: Secondary | ICD-10-CM | POA: Insufficient documentation

## 2024-01-23 DIAGNOSIS — C7970 Secondary malignant neoplasm of unspecified adrenal gland: Secondary | ICD-10-CM

## 2024-01-23 DIAGNOSIS — I2510 Atherosclerotic heart disease of native coronary artery without angina pectoris: Secondary | ICD-10-CM | POA: Insufficient documentation

## 2024-01-23 DIAGNOSIS — K219 Gastro-esophageal reflux disease without esophagitis: Secondary | ICD-10-CM | POA: Insufficient documentation

## 2024-01-23 DIAGNOSIS — C7971 Secondary malignant neoplasm of right adrenal gland: Secondary | ICD-10-CM | POA: Insufficient documentation

## 2024-01-23 DIAGNOSIS — Z5111 Encounter for antineoplastic chemotherapy: Secondary | ICD-10-CM | POA: Insufficient documentation

## 2024-01-23 DIAGNOSIS — Z79899 Other long term (current) drug therapy: Secondary | ICD-10-CM | POA: Insufficient documentation

## 2024-01-23 DIAGNOSIS — E785 Hyperlipidemia, unspecified: Secondary | ICD-10-CM | POA: Insufficient documentation

## 2024-01-23 DIAGNOSIS — Z79624 Long term (current) use of inhibitors of nucleotide synthesis: Secondary | ICD-10-CM | POA: Insufficient documentation

## 2024-01-23 DIAGNOSIS — Z8673 Personal history of transient ischemic attack (TIA), and cerebral infarction without residual deficits: Secondary | ICD-10-CM | POA: Insufficient documentation

## 2024-01-23 DIAGNOSIS — R918 Other nonspecific abnormal finding of lung field: Secondary | ICD-10-CM | POA: Insufficient documentation

## 2024-01-23 DIAGNOSIS — I5032 Chronic diastolic (congestive) heart failure: Secondary | ICD-10-CM | POA: Diagnosis not present

## 2024-01-23 DIAGNOSIS — I130 Hypertensive heart and chronic kidney disease with heart failure and stage 1 through stage 4 chronic kidney disease, or unspecified chronic kidney disease: Secondary | ICD-10-CM | POA: Insufficient documentation

## 2024-01-23 DIAGNOSIS — M792 Neuralgia and neuritis, unspecified: Secondary | ICD-10-CM

## 2024-01-23 DIAGNOSIS — Z7902 Long term (current) use of antithrombotics/antiplatelets: Secondary | ICD-10-CM | POA: Insufficient documentation

## 2024-01-23 DIAGNOSIS — D472 Monoclonal gammopathy: Secondary | ICD-10-CM | POA: Diagnosis not present

## 2024-01-23 DIAGNOSIS — Z7189 Other specified counseling: Secondary | ICD-10-CM

## 2024-01-23 LAB — TSH: TSH: 1.82 u[IU]/mL (ref 0.350–4.500)

## 2024-01-23 LAB — CBC WITH DIFFERENTIAL (CANCER CENTER ONLY)
Abs Immature Granulocytes: 0.01 K/uL (ref 0.00–0.07)
Basophils Absolute: 0 K/uL (ref 0.0–0.1)
Basophils Relative: 1 %
Eosinophils Absolute: 0.3 K/uL (ref 0.0–0.5)
Eosinophils Relative: 5 %
HCT: 35.4 % — ABNORMAL LOW (ref 39.0–52.0)
Hemoglobin: 12 g/dL — ABNORMAL LOW (ref 13.0–17.0)
Immature Granulocytes: 0 %
Lymphocytes Relative: 17 %
Lymphs Abs: 1 K/uL (ref 0.7–4.0)
MCH: 31.3 pg (ref 26.0–34.0)
MCHC: 33.9 g/dL (ref 30.0–36.0)
MCV: 92.4 fL (ref 80.0–100.0)
Monocytes Absolute: 0.7 K/uL (ref 0.1–1.0)
Monocytes Relative: 12 %
Neutro Abs: 3.7 K/uL (ref 1.7–7.7)
Neutrophils Relative %: 65 %
Platelet Count: 307 K/uL (ref 150–400)
RBC: 3.83 MIL/uL — ABNORMAL LOW (ref 4.22–5.81)
RDW: 14.4 % (ref 11.5–15.5)
WBC Count: 5.6 K/uL (ref 4.0–10.5)
nRBC: 0 % (ref 0.0–0.2)

## 2024-01-23 LAB — CMP (CANCER CENTER ONLY)
ALT: 9 U/L (ref 0–44)
AST: 21 U/L (ref 15–41)
Albumin: 4.3 g/dL (ref 3.5–5.0)
Alkaline Phosphatase: 79 U/L (ref 38–126)
Anion gap: 8 (ref 5–15)
BUN: 14 mg/dL (ref 8–23)
CO2: 27 mmol/L (ref 22–32)
Calcium: 9.3 mg/dL (ref 8.9–10.3)
Chloride: 102 mmol/L (ref 98–111)
Creatinine: 1.31 mg/dL — ABNORMAL HIGH (ref 0.61–1.24)
GFR, Estimated: 58 mL/min — ABNORMAL LOW
Glucose, Bld: 85 mg/dL (ref 70–99)
Potassium: 3.7 mmol/L (ref 3.5–5.1)
Sodium: 137 mmol/L (ref 135–145)
Total Bilirubin: 0.4 mg/dL (ref 0.0–1.2)
Total Protein: 7.7 g/dL (ref 6.5–8.1)

## 2024-01-23 MED ORDER — SODIUM CHLORIDE 0.9 % IV SOLN: INTRAVENOUS | Status: DC

## 2024-01-23 MED ORDER — SODIUM CHLORIDE 0.9 % IV SOLN
1500.0000 mg | Freq: Once | INTRAVENOUS | Status: AC
  Administered 2024-01-23: 1500 mg via INTRAVENOUS
  Filled 2024-01-23: qty 30

## 2024-01-23 NOTE — Progress Notes (Signed)
 " Radiation Oncology         [phone] ________________________________  Name: [name] MRN: [id number]  Date: 01/23/2024  DOB: November 15, 1950  Follow-Up Visit Note Outpatient - Conducted via telephone at patient request.   I spoke with the patient to conduct this consult visit via telephone. The patient was notified in advance and was offered an in person or telemedicine meeting to allow for face to face communication but instead preferred to proceed with a telephone    CC: [name], MD  [name], MD  Diagnosis and Prior Radiotherapy:    ICD-10-CM   1. Mass of upper lobe of left lung [R91.8]  R91.8           Cancer Staging  Adenocarcinoma of upper lobe of left lung [facility]) Staging form: Lung, AJCC V9 - Clinical stage from 08/29/2023: Stage IIIA (cT3, cN1, cM0) - Signed by Izell Domino, MD on 08/29/2023 Stage prefix: Initial diagnosis - Pathologic: Stage IIIA (pT3, pN1, cM0) - Signed by Sherrod Sherrod, MD on 09/08/2023 Method of lymph node assessment: Clinical  ==========DELIVERED PLANS==========  First Treatment Date: 2023-09-06 Last Treatment Date: 2023-10-23   Plan Name: Lung_L Site: Lung, Left Technique: IMRT Mode: Photon Dose Per Fraction: 2 Gy Prescribed Dose (Delivered / Prescribed): 66 Gy / 66 Gy Prescribed Fxs (Delivered / Prescribed): 33 / 33  Stage IIIA (cT3, N1, M0) Adenocarcinoma, NSCLC, of the LUL; s/p chemoRT completed on 10/23/2023  CHIEF COMPLAINT: Here for follow-up and surveillance of NSCLC cancer  Narrative:  The patient returns for follow-up. He completed his treatment approximately 2 months ago.   Discussed the use of AI scribe software for clinical note transcription with the patient, who gave verbal consent to proceed.   Patient was last seen by us  12/05/2023. At that time, he noted left parascapular pain. Patient was seen in the ED prior to that visit and CXR showed no acute findings. Pain was attributed to possible radiation  induced irritation. We recommended continued Robaxin  and lidocaine  patch for pain management.   He called our office with ongoing left sided chest pain that was very bothersome to him. Pain was not controlled with Robaxin . CT of the chest was ordered for further evaluation and a prescription of Gabapentin  was sent to help with pain.   CT of the chest on 12/29/2023 redomnstrated the LUL lung mass; a new small-to-moderate left pleural effusion; a new sub 4 mm noncalcified nodule in the RLL; and redemonstration of multiple ill-defined hypoattenuating liver lesions.   Today, the patient reports to have his pain well controlled with Gabapentin . He is taking 300 mg BID. He notes shortness of breath with exertion, occasionally at rest. He denies any cough, fever, or chills. He states that the pain is left side and radiates around his left chest. He denies back and spinal pain. He denies any skin changes.    ALLERGIES:  is allergic to divalproex sodium, lamotrigine, valproic acid, amitriptyline, aspirin , hydrocodone , lisinopril, lithium, morphine , nsaids, oxycodone, oxycodone-acetaminophen , and pork allergy.  Meds: Current Outpatient Medications  Medication Sig Dispense Refill   acetaminophen  (TYLENOL ) 500 MG tablet Take 1-2 tablets (500-1,000 mg total) by mouth every 6 (six) hours as needed for moderate pain. 30 tablet 0   allopurinol  (ZYLOPRIM ) 100 MG tablet Take 150 mg by mouth daily.     amLODipine  (NORVASC ) 10 MG tablet Take 1 tablet (10 mg total) by mouth daily. 90 tablet 3   busPIRone  (BUSPAR ) 30 MG tablet Take 30 mg by mouth  2 (two) times daily.     butalbital-acetaminophen -caffeine (FIORICET) 50-325-40 MG tablet Take 1 tablet by mouth every 6 (six) hours as needed for headache.     cetirizine  (ZYRTEC  ALLERGY) 10 MG tablet Take 1 tablet (10 mg total) by mouth daily. 30 tablet 0   clopidogrel  (PLAVIX ) 75 MG tablet Take 1 tablet (75 mg total) by mouth daily. Okay to restart this medication on  08/22/2023.     dextromethorphan  (DELSYM ) 30 MG/5ML liquid Take 5 mLs (30 mg total) by mouth 2 (two) times daily as needed for cough. 89 mL 0   ferrous sulfate  (FEROSUL) 325 (65 FE) MG tablet TAKE 1 TABLET (325 MG TOTAL) BY MOUTH DAILY WITH BREAKFAST. 28 tablet 2   fluticasone  (FLONASE ) 50 MCG/ACT nasal spray Place 1 spray into both nostrils daily. 16 g 2   folic acid  (FOLVITE ) 1 MG tablet Take 1 mg by mouth daily.     furosemide  (LASIX ) 40 MG tablet Take 1 tablet (40 mg total) by mouth daily. 90 tablet 3   gabapentin  (NEURONTIN ) 300 MG capsule Take 1 capsule (300 mg total) by mouth 3 (three) times daily. 90 capsule 0   hydrALAZINE  (APRESOLINE ) 10 MG tablet Take 1 tablet (10 mg total) by mouth 2 (two) times daily. 180 tablet 3   HYDROcodone  bit-homatropine (HYCODAN) 5-1.5 MG/5ML syrup Take 5 mLs by mouth every 4 (four) hours as needed (Persistent cough). 120 mL 0   hydrocortisone cream 1 % Apply 1 Application topically.     Hydroxychloroquine  Sulfate 400 MG TABS Take 400 mg by mouth at bedtime.     ketoconazole (NIZORAL) 2 % cream Apply 1 Application topically daily.     Lactobacillus (ACIDOPHILUS) CAPS capsule Take 1 capsule by mouth daily. 100 capsule 0   lidocaine  (LIDODERM ) 5 % Place 1 patch onto the skin daily as needed (for pain- Remove & Discard patch within 12 hours or as directed by MD). 30 patch 3   lidocaine  (XYLOCAINE ) 5 % ointment Apply 1 Application topically as needed. 35.44 g 0   loperamide (IMODIUM) 2 MG capsule 2 mg as needed.     methocarbamol  (ROBAXIN ) 500 MG tablet Take 1 tablet (500 mg total) by mouth 2 (two) times daily. 20 tablet 0   Multiple Vitamin (MULTIVITAMIN) TABS TAKE 1 TABLET BY MOUTH DAILY. 27 tablet 1   PARoxetine  (PAXIL -CR) 37.5 MG 24 hr tablet Take 1 tablet (37.5 mg total) by mouth at bedtime.     primidone  (MYSOLINE ) 250 MG tablet TAKE 1 TABLET (250 MG TOTAL) BY MOUTH 2 (TWO) TIMES DAILY. 60 tablet 3   QUEtiapine  (SEROQUEL ) 25 MG tablet Take 25 mg by mouth at  bedtime.     rosuvastatin  (CRESTOR ) 40 MG tablet Take 40 mg by mouth daily.     traZODone  (DESYREL ) 100 MG tablet Take 100 mg by mouth at bedtime.     valACYclovir  (VALTREX ) 1000 MG tablet Take 1,000 mg by mouth daily.     Vitamin D , Ergocalciferol , (DRISDOL ) 1.25 MG (50000 UNIT) CAPS capsule Take 50,000 Units by mouth every Saturday.     Wound Cleansers (RADIAPLEX EX) Apply topically as needed.     No current facility-administered medications for this encounter.   Facility-Administered Medications Ordered in Other Encounters  Medication Dose Route Frequency Provider Last Rate Last Admin   0.9 %  sodium chloride  infusion   Intravenous Continuous Sherrod Sherrod, MD   Stopped at 01/23/24 1542    Physical Findings: The patient is in no acute distress. Patient  is alert and oriented.  vitals were not taken for this visit. SABRA     Deferred, due to nature of telephone visit.     Lab Findings: Lab Results  Component Value Date   WBC 5.6 01/23/2024   HGB 12.0 (L) 01/23/2024   HCT 35.4 (L) 01/23/2024   MCV 92.4 01/23/2024   PLT 307 01/23/2024    Radiographic Findings: NM PET Image Restag (PS) Skull Base To Thigh Result Date: 01/22/2024 EXAM: PET AND CT SKULL BASE TO MID THIGH 01/17/2024 05:52:15 PM TECHNIQUE: RADIOPHARMACEUTICAL: 10.0 mCi F-18 FDG Glucose level 95 mg/dl. Blood pool SUV 1.9. Misregistration of PET and CT data in the head and neck region due to turning of the head by 65 degrees between the CT and PET acquisitions. PET imaging was acquired from the base of the skull to the mid thighs. Non-contrast enhanced computed tomography was obtained for attenuation correction and anatomic localization. COMPARISON: 07/26/2023 and PET CT from 12. CLINICAL HISTORY: Non-small cell lung cancer (NSCLC), monitor. FINDINGS: HEAD AND NECK: No metabolically active cervical lymphadenopathy. CHEST: Reduced activity and confluence of the left upper lobe mass, current maximum SUV 6.7 and previous 22.5.  Surrounding airspace opacity and volume loss in the left upper lobe, much of which may be therapy related. New moderate to large left pleural effusion. 8 mm right lower lobe ground glass density pulmonary nodule on image 33 series 7 without substantial hypermetabolic activity, although low grade adenocarcinoma is not completely excluded. PORT-A-CATH tip: Cavoatrial junction. Atherosclerotic thoracic aorta. No metabolically active lymphadenopathy. ABDOMEN AND PELVIS: New 1.2 cm right adrenal mass with maximum SUV 16.6, suspicion for metastatic lesion given the clinical context. Focally accentuated left posterior anal activity at about the 5 o'clock position with maximum SUV 10.3, formerly 11.6, cannot exclude neoplastic or inflammatory process. Small type 1 hiatal hernia. Hypodense and photopenic hepatic and renal lesions favoring cysts. No further imaging follow up of these lesions is indicated. Cholecystectomy. Systemic atherosclerosis is present, including the aorta and iliac arteries. No metabolically active lymphadenopathy. Physiologic activity within the gastrointestinal and genitourinary systems. BONES AND SOFT TISSUE: Lipoma of the left gluteus minimus muscle. No abnormal FDG activity localizes to the bones. No metabolically active aggressive osseous lesion. IMPRESSION: 1. Decreased hypermetabolic activity and confluence of the left upper lobe mass (maximum SUV 6.7, previously 22.5) with surrounding left upper lobe airspace opacity and volume loss, much of which may be therapy related. 2. New 1.2 cm right adrenal mass with maximum SUV 16.6, highly suspicious for metastasis. 3. New from 07/26/2023 and mildly increased from 12/29/23 moderate to large left pleural effusion. 4. Focally accentuated left posterior anal activity at about the 5 o'clock position (maximum SUV 10.3, previously 11.6), with neoplastic or inflammatory process not excluded. 5. 8 mm right lower lobe ground-glass pulmonary nodule without  substantial hypermetabolic activity, with low-grade adenocarcinoma not excluded. 6. Additional chronic and incidental findings include port catheter tip at the cavoatrial junction, systemic atherosclerosis, small type 1 hiatal hernia, cholecystectomy, and a left gluteus minimus intramuscular lipoma. Electronically signed by: Ryan Salvage MD 01/22/2024 09:47 AM EST RP Workstation: [id number]   CT Chest W Contrast Result Date: 12/31/2023 CLINICAL DATA:  Chest pain, nonspecific. History of non-small cell lung carcinoma. * Tracking Code: BO * EXAM: CT CHEST WITH CONTRAST TECHNIQUE: Multidetector CT imaging of the chest was performed during intravenous contrast administration. RADIATION DOSE REDUCTION: This exam was performed according to the departmental dose-optimization program which includes automated exposure control, adjustment of the mA and/or  kV according to patient size and/or use of iterative reconstruction technique. CONTRAST:  75mL OMNIPAQUE  IOHEXOL  300 MG/ML  SOLN COMPARISON:  CT scan chest from 11/14/2023. FINDINGS: Cardiovascular: Normal cardiac size. No pericardial effusion. No aortic aneurysm. There are coronary artery calcifications, in keeping with coronary artery disease. There are also mild peripheral atherosclerotic vascular calcifications of thoracic aorta and its major branches. Mediastinum/Nodes: Visualized thyroid  gland appears grossly unremarkable. No solid / cystic mediastinal masses. The esophagus is nondistended precluding optimal assessment. No axillary, mediastinal or hilar lymphadenopathy by size criteria. Lungs/Pleura: The central tracheo-bronchial tree is patent. Redemonstration of previously seen mass in the left upper lobe however, there is increasing adjacent/peripheral airspace opacities. There is also new small-to-moderate left pleural effusion. Redemonstration of loss of fat planes between the mass and the left hilar vessels. Redemonstration of peripheral/subpleural  ground-glass opacity in the right lung lower lobe (series 2, image 71), measuring approximately 9 x 9 mm, unchanged since the prior study, when remeasured in similar fashion. There is a stable subpleural sub 4 mm noncalcified nodule in the right lung lower lobe (series 2, image 96). However, there is a new sub 4 mm noncalcified nodule in the right lung lower lobe (series 2, image 91). No lung mass, consolidation or pleural effusion on the right. Upper Abdomen: Redemonstration of multiple ill-defined hypoattenuating liver lesions with largest in the left hepatic lobe, segment 3/4B measuring up to 3.7 x 3.9 cm. There are multiple simple cysts in visualized bilateral kidneys. Remaining visualized upper abdominal viscera within normal limits. Musculoskeletal: A CT Port-a-Cath is seen in the right upper chest wall with the catheter terminating in the cavo-atrial junction region. Visualized soft tissues of the chest wall are otherwise grossly unremarkable. No suspicious osseous lesions. There are mild to moderate multilevel degenerative changes in the visualized spine. IMPRESSION: 1. Redemonstration of left upper lobe lung mass however, there is increasing adjacent/peripheral airspace opacities, which is nonspecific. Differential diagnosis includes peripheral postobstructive pneumonia/atelectasis; however, interval worsening of the tumor remains in the differential diagnosis. Short-term follow-up examination is recommended after the acute episode subsides. 2. New small-to-moderate left pleural effusion. 3. There is a new sub 4 mm noncalcified nodule in the right lung lower lobe. Other pre-existing lung nodules are stable. 4. Redemonstration of multiple ill-defined hypoattenuating liver lesions. 5. Multiple other nonacute observations, as described above. Aortic Atherosclerosis (ICD10-I70.0).  Head Electronically Signed   By: Ree Molt M.D.   On: 12/31/2023 16:04    Impression/Plan:  Stage IIIA (cT3, N1, M0)  Adenocarcinoma, NSCLC, of the LUL; s/p chemoRT completed on 10/25/2023   We reviewed the Mr. Gadberry's CT of the chest from 12/29/2023. Imaging again demonstrates no obvious etiology of his pain. The described pain is within the treatment field and could be from radiation induced irritation. Patient's pain has fortunately been well controlled with Gabapentin  300 mg BID. I have refilled this prescription for him.   Of note, the treated LUL was noted to have have increasing adjacent/peripheral airspace opacities, nonspecific in nature. We will consider ordering PET imaging for follow-up of these findings. I will review this with Dr. Izell and call patient with follow-up plan moving forward.   He is scheduled to see palliative care on 01/16/2024.   He will proceed with immunotherapy follow-up under the care of Dr. Sherrod. He is scheduled to see medical oncology on 01/23/2023.    On date of service, in total, I spent *** minutes on this encounter. Patient was seen in person. ____________  Leeroy Due, PA-C   "

## 2024-01-23 NOTE — Telephone Encounter (Signed)
 Referral and documents routed through [facility] to Dr Gwenith Pence.

## 2024-01-23 NOTE — Patient Instructions (Signed)

## 2024-01-23 NOTE — Progress Notes (Signed)
 "     [facility] Telephone:([phone]   Fax:([phone]) [phone]  OFFICE PROGRESS NOTE  Christopher Francisco, MD [address] [place] [address]  DIAGNOSIS:  Stage IIIA (T3, N1, M0) non-small cell lung cancer, adenocarcinoma presented with large left upper lobe lung mass in addition to left hilar lymphadenopathy diagnosed in August 2025.    Molecular Studies:  Biomarker Findings Tumor Mutational Burden - 13 Muts/Mb HRD signature - HRDsig Negative Microsatellite status - MS-Equivocal ? Genomic Findings For a complete list of the genes assayed, please refer to the Appendix. MET exon 14 splice site (3028+1G>[id number]), amplification CDK4 amplification MDM2 amplification PIK3CA E453K, E726K   PDL1: 99%   PRIOR THERAPY: Concurrent chemoradiation with carboplatin  for an AUC of 2 and taxol  45 mg/m2. First dose on 09/19/23. Status post 5 cycles.  Last dose was given on 10/16/2023.   CURRENT THERAPY:  1) consolidation immunotherapy with durvalumab  1500 mg IV every 4 weeks.  Status post 2 cycle. 2) Fereheme 510 mg weekly x2. First dose on 09/26/23.   INTERVAL HISTORY: [name] 74 y.o. male returns to the clinic today for follow-up visit and his daughter [name] was available by phone.  Discussed the use of AI scribe software for clinical note transcription with the patient, who gave verbal consent to proceed.  History of Present Illness [name] is a 74 year old male with stage III lung adenocarcinoma on durvalumab  who presents for evaluation of a new right adrenal lesion.  He was initially diagnosed with stage III lung adenocarcinoma (MET exon 14 splice mutation, PD-L1 99%) in August 2025 and completed weekly carboplatin  and paclitaxel  with concurrent radiation. He is currently maintained on durvalumab  and paclitaxel . Recent PET imaging demonstrated decreased activity in the previously treated left lung mass and lymph node. However, a new lesion was identified in the right  adrenal gland, not present on prior scans. He expresses a preference to avoid invasive procedures involving the liver if possible. Options of biopsy versus direct radiation to the adrenal lesion were discussed. He remains on immunotherapy.  He continues to experience pain under the left scapula, radiating upward from the left side, which he identifies as his main complaint since the last visit. No new or different symptoms are associated with this pain. Imaging has not revealed bone metastases to explain the discomfort.  A persistent area of activity in the rectal/anal region was again noted on recent PET imaging, unchanged from prior studies. He does not report any specific symptoms referable to this area and has no known history of hemorrhoids or other anorectal pathology. Referral to gastroenterology for further evaluation, including possible rectal exam, was discussed.  He denies new neurological symptoms such as confusion or weakness. No symptoms suggestive of brain metastases are present.   MEDICAL HISTORY: Past Medical History:  Diagnosis Date   (HFpEF) heart failure with preserved ejection fraction (HCC)    Allergy    Anxiety    Arthritis    Chronic kidney disease    Depression    Frequent urination    GERD (gastroesophageal reflux disease)    Gout    Headache    mirgaines in the past   Herpes    History of nuclear stress test    Myoview  05/2021: EF 77, normal perfusion, low risk   Hyperlipidemia    Hypertension    IBS (irritable bowel syndrome)    Memory loss    MGUS (monoclonal gammopathy of unknown significance)    Obesity  Pre-diabetes    Stroke [facility])    Urgency of urination     ALLERGIES:  is allergic to divalproex sodium, lamotrigine, valproic acid, amitriptyline, aspirin , hydrocodone , lisinopril, lithium, morphine , nsaids, oxycodone, oxycodone-acetaminophen , and pork allergy.  MEDICATIONS:  Current Outpatient Medications  Medication Sig Dispense Refill    acetaminophen  (TYLENOL ) 500 MG tablet Take 1-2 tablets (500-1,000 mg total) by mouth every 6 (six) hours as needed for moderate pain. 30 tablet 0   allopurinol  (ZYLOPRIM ) 100 MG tablet Take 150 mg by mouth daily.     amLODipine  (NORVASC ) 10 MG tablet Take 1 tablet (10 mg total) by mouth daily. 90 tablet 3   busPIRone  (BUSPAR ) 30 MG tablet Take 30 mg by mouth 2 (two) times daily.     butalbital-acetaminophen -caffeine (FIORICET) 50-325-40 MG tablet Take 1 tablet by mouth every 6 (six) hours as needed for headache.     cetirizine  (ZYRTEC  ALLERGY) 10 MG tablet Take 1 tablet (10 mg total) by mouth daily. 30 tablet 0   clopidogrel  (PLAVIX ) 75 MG tablet Take 1 tablet (75 mg total) by mouth daily. Okay to restart this medication on 08/22/2023.     dextromethorphan  (DELSYM ) 30 MG/5ML liquid Take 5 mLs (30 mg total) by mouth 2 (two) times daily as needed for cough. 89 mL 0   ferrous sulfate  (FEROSUL) 325 (65 FE) MG tablet TAKE 1 TABLET (325 MG TOTAL) BY MOUTH DAILY WITH BREAKFAST. 28 tablet 2   fluticasone  (FLONASE ) 50 MCG/ACT nasal spray Place 1 spray into both nostrils daily. 16 g 2   folic acid  (FOLVITE ) 1 MG tablet Take 1 mg by mouth daily.     furosemide  (LASIX ) 40 MG tablet Take 1 tablet (40 mg total) by mouth daily. 90 tablet 3   gabapentin  (NEURONTIN ) 300 MG capsule Take 1 capsule (300 mg total) by mouth 3 (three) times daily. 90 capsule 0   hydrALAZINE  (APRESOLINE ) 10 MG tablet Take 1 tablet (10 mg total) by mouth 2 (two) times daily. 180 tablet 3   HYDROcodone  bit-homatropine (HYCODAN) 5-1.5 MG/5ML syrup Take 5 mLs by mouth every 4 (four) hours as needed (Persistent cough). 120 mL 0   hydrocortisone cream 1 % Apply 1 Application topically.     Hydroxychloroquine  Sulfate 400 MG TABS Take 400 mg by mouth at bedtime.     ketoconazole (NIZORAL) 2 % cream Apply 1 Application topically daily.     Lactobacillus (ACIDOPHILUS) CAPS capsule Take 1 capsule by mouth daily. 100 capsule 0   lidocaine  (LIDODERM ) 5  % Place 1 patch onto the skin daily as needed (for pain- Remove & Discard patch within 12 hours or as directed by MD). 30 patch 3   lidocaine  (XYLOCAINE ) 5 % ointment Apply 1 Application topically as needed. 35.44 g 0   loperamide (IMODIUM) 2 MG capsule 2 mg as needed.     methocarbamol  (ROBAXIN ) 500 MG tablet Take 1 tablet (500 mg total) by mouth 2 (two) times daily. 20 tablet 0   Multiple Vitamin (MULTIVITAMIN) TABS TAKE 1 TABLET BY MOUTH DAILY. 27 tablet 1   PARoxetine  (PAXIL -CR) 37.5 MG 24 hr tablet Take 1 tablet (37.5 mg total) by mouth at bedtime.     primidone  (MYSOLINE ) 250 MG tablet TAKE 1 TABLET (250 MG TOTAL) BY MOUTH 2 (TWO) TIMES DAILY. 60 tablet 3   QUEtiapine  (SEROQUEL ) 25 MG tablet Take 25 mg by mouth at bedtime.     rosuvastatin  (CRESTOR ) 40 MG tablet Take 40 mg by mouth daily.     traZODone  (  DESYREL ) 100 MG tablet Take 100 mg by mouth at bedtime.     valACYclovir  (VALTREX ) 1000 MG tablet Take 1,000 mg by mouth daily.     Vitamin D , Ergocalciferol , (DRISDOL ) 1.25 MG (50000 UNIT) CAPS capsule Take 50,000 Units by mouth every Saturday.     Wound Cleansers (RADIAPLEX EX) Apply topically as needed.     No current facility-administered medications for this visit.    SURGICAL HISTORY:  Past Surgical History:  Procedure Laterality Date   CHOLECYSTECTOMY N/A 12/17/2019   Procedure: LAPAROSCOPIC CHOLECYSTECTOMY WITH ICG INJECTION;  Surgeon: Ebbie Cough, MD;  Location: [facility] OR;  Service: General;  Laterality: N/A;   disckec     IR IMAGING GUIDED PORT INSERTION  09/12/2023   shouler arthroscopy Left    SPINAL FUSION     TONSILLECTOMY     TOTAL KNEE ARTHROPLASTY Right 11/15/2021   Procedure: TOTAL KNEE ARTHROPLASTY;  Surgeon: Melodi Lerner, MD;  Location: [facility] ORS;  Service: Orthopedics;  Laterality: Right;   VIDEO BRONCHOSCOPY WITH ENDOBRONCHIAL NAVIGATION Left 08/21/2023   Procedure: VIDEO BRONCHOSCOPY WITH ENDOBRONCHIAL NAVIGATION;  Surgeon: Shelah Lamar RAMAN, MD;  Location: [facility]  ENDOSCOPY;  Service: Pulmonary;  Laterality: Left;   VIDEO BRONCHOSCOPY WITH ENDOBRONCHIAL ULTRASOUND  08/21/2023   Procedure: BRONCHOSCOPY, WITH EBUS;  Surgeon: Shelah Lamar RAMAN, MD;  Location: [facility] ENDOSCOPY;  Service: Pulmonary;;   VIDEO BRONCHOSCOPY WITH RADIAL ENDOBRONCHIAL ULTRASOUND  08/21/2023   Procedure: VIDEO BRONCHOSCOPY WITH RADIAL ENDOBRONCHIAL ULTRASOUND;  Surgeon: Shelah Lamar RAMAN, MD;  Location: [facility] ENDOSCOPY;  Service: Pulmonary;;   WISDOM TOOTH EXTRACTION      REVIEW OF SYSTEMS:  Constitutional: positive for fatigue Eyes: negative Ears, nose, mouth, throat, and face: negative Respiratory: positive for cough Cardiovascular: negative Gastrointestinal: negative Genitourinary:negative Integument/breast: negative Hematologic/lymphatic: negative Musculoskeletal:positive for back pain Neurological: negative Behavioral/Psych: negative Endocrine: negative Allergic/Immunologic: negative   PHYSICAL EXAMINATION: General appearance: alert, cooperative, fatigued, and no distress Head: Normocephalic, without obvious abnormality, atraumatic Neck: no adenopathy, no JVD, supple, symmetrical, trachea midline, and thyroid  not enlarged, symmetric, no tenderness/mass/nodules Lymph nodes: Cervical, supraclavicular, and axillary nodes normal. Resp: clear to auscultation bilaterally Back: symmetric, no curvature. ROM normal. No CVA tenderness. Cardio: regular rate and rhythm, S1, S2 normal, no murmur, click, rub or gallop GI: soft, non-tender; bowel sounds normal; no masses,  no organomegaly Extremities: extremities normal, atraumatic, no cyanosis or edema Neurologic: Alert and oriented X 3, normal strength and tone. Normal symmetric reflexes. Normal coordination and gait  ECOG PERFORMANCE STATUS: 1 - Symptomatic but completely ambulatory  Blood pressure 131/76, pulse 90, temperature 97.8 F (36.6 C), temperature source Temporal, resp. rate 17, height 5' 7 (1.702 m), weight 203 lb 12.8 oz  (92.4 kg), SpO2 95%.  LABORATORY DATA: Lab Results  Component Value Date   WBC 5.6 01/23/2024   HGB 12.0 (L) 01/23/2024   HCT 35.4 (L) 01/23/2024   MCV 92.4 01/23/2024   PLT 307 01/23/2024      Chemistry      Component Value Date/Time   NA 137 01/23/2024 1149   NA 140 07/27/2023 1454   K 3.7 01/23/2024 1149   CL 102 01/23/2024 1149   CO2 27 01/23/2024 1149   BUN 14 01/23/2024 1149   BUN 15 07/27/2023 1454   CREATININE 1.31 (H) 01/23/2024 1149      Component Value Date/Time   CALCIUM  9.3 01/23/2024 1149   ALKPHOS 79 01/23/2024 1149   AST 21 01/23/2024 1149   ALT 9 01/23/2024 1149   BILITOT 0.4 01/23/2024  1149       RADIOGRAPHIC STUDIES: NM PET Image Restag (PS) Skull Base To Thigh Result Date: 01/22/2024 EXAM: PET AND CT SKULL BASE TO MID THIGH 01/17/2024 05:52:15 PM TECHNIQUE: RADIOPHARMACEUTICAL: 10.0 mCi F-18 FDG Glucose level 95 mg/dl. Blood pool SUV 1.9. Misregistration of PET and CT data in the head and neck region due to turning of the head by 65 degrees between the CT and PET acquisitions. PET imaging was acquired from the base of the skull to the mid thighs. Non-contrast enhanced computed tomography was obtained for attenuation correction and anatomic localization. COMPARISON: 07/26/2023 and PET CT from 12. CLINICAL HISTORY: Non-small cell lung cancer (NSCLC), monitor. FINDINGS: HEAD AND NECK: No metabolically active cervical lymphadenopathy. CHEST: Reduced activity and confluence of the left upper lobe mass, current maximum SUV 6.7 and previous 22.5. Surrounding airspace opacity and volume loss in the left upper lobe, much of which may be therapy related. New moderate to large left pleural effusion. 8 mm right lower lobe ground glass density pulmonary nodule on image 33 series 7 without substantial hypermetabolic activity, although low grade adenocarcinoma is not completely excluded. PORT-A-CATH tip: Cavoatrial junction. Atherosclerotic thoracic aorta. No metabolically  active lymphadenopathy. ABDOMEN AND PELVIS: New 1.2 cm right adrenal mass with maximum SUV 16.6, suspicion for metastatic lesion given the clinical context. Focally accentuated left posterior anal activity at about the 5 o'clock position with maximum SUV 10.3, formerly 11.6, cannot exclude neoplastic or inflammatory process. Small type 1 hiatal hernia. Hypodense and photopenic hepatic and renal lesions favoring cysts. No further imaging follow up of these lesions is indicated. Cholecystectomy. Systemic atherosclerosis is present, including the aorta and iliac arteries. No metabolically active lymphadenopathy. Physiologic activity within the gastrointestinal and genitourinary systems. BONES AND SOFT TISSUE: Lipoma of the left gluteus minimus muscle. No abnormal FDG activity localizes to the bones. No metabolically active aggressive osseous lesion. IMPRESSION: 1. Decreased hypermetabolic activity and confluence of the left upper lobe mass (maximum SUV 6.7, previously 22.5) with surrounding left upper lobe airspace opacity and volume loss, much of which may be therapy related. 2. New 1.2 cm right adrenal mass with maximum SUV 16.6, highly suspicious for metastasis. 3. New from 07/26/2023 and mildly increased from 12/29/23 moderate to large left pleural effusion. 4. Focally accentuated left posterior anal activity at about the 5 o'clock position (maximum SUV 10.3, previously 11.6), with neoplastic or inflammatory process not excluded. 5. 8 mm right lower lobe ground-glass pulmonary nodule without substantial hypermetabolic activity, with low-grade adenocarcinoma not excluded. 6. Additional chronic and incidental findings include port catheter tip at the cavoatrial junction, systemic atherosclerosis, small type 1 hiatal hernia, cholecystectomy, and a left gluteus minimus intramuscular lipoma. Electronically signed by: Ryan Salvage MD 01/22/2024 09:47 AM EST RP Workstation: [id number]   CT Chest W Contrast Result  Date: 12/31/2023 CLINICAL DATA:  Chest pain, nonspecific. History of non-small cell lung carcinoma. * Tracking Code: BO * EXAM: CT CHEST WITH CONTRAST TECHNIQUE: Multidetector CT imaging of the chest was performed during intravenous contrast administration. RADIATION DOSE REDUCTION: This exam was performed according to the departmental dose-optimization program which includes automated exposure control, adjustment of the mA and/or kV according to patient size and/or use of iterative reconstruction technique. CONTRAST:  75mL OMNIPAQUE  IOHEXOL  300 MG/ML  SOLN COMPARISON:  CT scan chest from 11/14/2023. FINDINGS: Cardiovascular: Normal cardiac size. No pericardial effusion. No aortic aneurysm. There are coronary artery calcifications, in keeping with coronary artery disease. There are also mild peripheral atherosclerotic vascular calcifications of thoracic  aorta and its major branches. Mediastinum/Nodes: Visualized thyroid  gland appears grossly unremarkable. No solid / cystic mediastinal masses. The esophagus is nondistended precluding optimal assessment. No axillary, mediastinal or hilar lymphadenopathy by size criteria. Lungs/Pleura: The central tracheo-bronchial tree is patent. Redemonstration of previously seen mass in the left upper lobe however, there is increasing adjacent/peripheral airspace opacities. There is also new small-to-moderate left pleural effusion. Redemonstration of loss of fat planes between the mass and the left hilar vessels. Redemonstration of peripheral/subpleural ground-glass opacity in the right lung lower lobe (series 2, image 71), measuring approximately 9 x 9 mm, unchanged since the prior study, when remeasured in similar fashion. There is a stable subpleural sub 4 mm noncalcified nodule in the right lung lower lobe (series 2, image 96). However, there is a new sub 4 mm noncalcified nodule in the right lung lower lobe (series 2, image 91). No lung mass, consolidation or pleural effusion  on the right. Upper Abdomen: Redemonstration of multiple ill-defined hypoattenuating liver lesions with largest in the left hepatic lobe, segment 3/4B measuring up to 3.7 x 3.9 cm. There are multiple simple cysts in visualized bilateral kidneys. Remaining visualized upper abdominal viscera within normal limits. Musculoskeletal: A CT Port-a-Cath is seen in the right upper chest wall with the catheter terminating in the cavo-atrial junction region. Visualized soft tissues of the chest wall are otherwise grossly unremarkable. No suspicious osseous lesions. There are mild to moderate multilevel degenerative changes in the visualized spine. IMPRESSION: 1. Redemonstration of left upper lobe lung mass however, there is increasing adjacent/peripheral airspace opacities, which is nonspecific. Differential diagnosis includes peripheral postobstructive pneumonia/atelectasis; however, interval worsening of the tumor remains in the differential diagnosis. Short-term follow-up examination is recommended after the acute episode subsides. 2. New small-to-moderate left pleural effusion. 3. There is a new sub 4 mm noncalcified nodule in the right lung lower lobe. Other pre-existing lung nodules are stable. 4. Redemonstration of multiple ill-defined hypoattenuating liver lesions. 5. Multiple other nonacute observations, as described above. Aortic Atherosclerosis (ICD10-I70.0).  Head Electronically Signed   By: Ree Molt M.D.   On: 12/31/2023 16:04      ASSESSMENT AND PLAN: This is a 74 years old African-American male with Stage IIIA (T3, N1, M0) non-small cell lung cancer, adenocarcinoma presented with large left upper lobe lung mass in addition to left hilar lymphadenopathy diagnosed in August 2025.  Molecular study showed positive MET exon 14 splice mutation and PD-L1 expression of 99%. He underwent a course of concurrent chemoradiation with weekly carboplatin  for AUC of 2 and paclitaxel  45 mg/M2.  Status post 5 cycles.   Last dose of treatment was given on 09/26/2023.  He tolerated the treatment well except for the increasing fatigue and cough.  He is currently undergoing consolidation treatment with immunotherapy with nivolumab 1500 mg IV every 4 weeks status post 2 cycles. He had repeat PET scan performed recently.  I personally independently reviewed the PET scan imaging and discussed the results with the patient and his daughter.  Unfortunately the PET scan showed new right adrenal gland nodule suspicious for metastasis.  There is decrease in the hypermetabolic activity and confluence of the left upper lobe lung mass with surrounding left upper lobe airspace opacity and volume loss secondary to radiation treatment.  There was also new and mildly increased moderate to large left pleural effusion and focally accentuated left posterior anal activity suspicious for neoplasm or inflammatory process. I had a lengthy discussion with the patient and his daughter about these results  and further treatment options. Assessment and Plan Assessment & Plan Stage III lung adenocarcinoma with suspected right adrenal metastasis Stage III lung adenocarcinoma with MET exon 14 splice mutation and high PD-L1 expression, previously treated with chemoradiation and ongoing durvalumab  immunotherapy. Imaging demonstrates decreased activity in left lung and lymph node lesions, consistent with therapeutic response. A new right adrenal gland lesion is highly suspicious for metastatic disease (estimated 90% likelihood), which would upstage to stage IV if confirmed. No evidence of bone or brain metastases. Management is contingent on confirmation of adrenal metastasis, which will impact staging and subsequent therapy. Adrenal biopsy presents technical challenges due to proximity to the liver; if not feasible, radiation therapy is considered. Further progression may necessitate transition to targeted therapy or chemotherapy. - Ordered interventional  radiology consult for possible biopsy of right adrenal gland lesion to confirm metastatic disease if technically feasible. - If biopsy is not feasible, proceed with radiation therapy to the right adrenal gland lesion. - Continued durvalumab  immunotherapy during radiation therapy. - Expanded surveillance imaging to include chest, abdomen, and pelvis on future scans. - If further progression (multiple new sites) occurs, consider changing systemic therapy to targeted therapy or chemotherapy based on molecular profile.  Anal/rectal lesion under evaluation Imaging reveals persistent activity in the anal/rectal region of unclear etiology. Differential diagnosis includes hemorrhoids or other pathology. Further evaluation is necessary to exclude malignancy or other causes. - Referred to gastroenterology (Dr. Gwenith Shahid at [facility]) for evaluation, including possible rectal examination.  Neoplasm-related pain He experiences pain under the left scapula radiating to the left side. Imaging does not demonstrate active bone metastasis or new lesions; pain is likely attributable to prior radiation changes. No new sites of bone involvement identified. - Continued palliative care follow-up for pain management. - Confirmed absence of bone metastasis on current imaging. The patient was advised to call immediately if she has any other concerning symptoms in the interval. The patient voices understanding of current disease status and treatment options and is in agreement with the current care plan.  All questions were answered. The patient knows to call the clinic with any problems, questions or concerns. We can certainly see the patient much sooner if necessary. The total time spent in the appointment was 55 minutes including review of chart and various tests results, discussions about plan of care and coordination of care plan .   Disclaimer: This note was dictated with voice recognition software.  Similar sounding words can inadvertently be transcribed and may not be corrected upon review.        "

## 2024-01-23 NOTE — Progress Notes (Signed)
 "    Palliative Medicine [facility]  [phone] [phone]   Name: [name] Date: 01/23/2024 MRN: [id number]  DOB: 10/06/1950  Patient Care Team: [name], MD as PCP - General (Family Medicine) Santo Stanly LABOR, MD as PCP - Cardiology (Cardiology) Gearline Norris, MD as Consulting Physician (Nephrology) Pccm, Md, MD (Pulmonary Disease) Prentis Duwaine BROCKS, RN as Oncology Nurse Navigator Delynn Verdel RAMAN, MD as Referring Physician (Psychiatry) Sherrod Sherrod, MD as Consulting Physician (Oncology) Pickenpack-Cousar, Fannie SAILOR, NP as Nurse Practitioner (Hospice and Palliative Medicine)   INTERVAL HISTORY: [name] is a 74 y.o. male with oncologic medical history including stage IIIa non-small cell lung cancer with left hilar lymphadenopathy (08/2023) currently undergoing immunotherapy treatments every 4 weeks.  Palliative is seeing patient for symptom management and goals of care.   SOCIAL HISTORY:     reports that he quit smoking about 36 years ago. His smoking use included cigarettes. He has never used smokeless tobacco. He reports that he does not drink alcohol  and does not use drugs.  ADVANCE DIRECTIVES:  On file. His daughter, [name] is his clinical research associate.   CODE STATUS: Full code  PAST MEDICAL HISTORY: Past Medical History:  Diagnosis Date   (HFpEF) heart failure with preserved ejection fraction (HCC)    Allergy    Anxiety    Arthritis    Chronic kidney disease    Depression    Frequent urination    GERD (gastroesophageal reflux disease)    Gout    Headache    mirgaines in the past   Herpes    History of nuclear stress test    Myoview  05/2021: EF 77, normal perfusion, low risk   Hyperlipidemia    Hypertension    IBS (irritable bowel syndrome)    Memory loss    MGUS (monoclonal gammopathy of unknown significance)    Obesity    Pre-diabetes    Stroke (HCC)    Urgency of urination      ALLERGIES:  is allergic to divalproex sodium, lamotrigine, valproic acid, amitriptyline, aspirin , hydrocodone , lisinopril, lithium, morphine , nsaids, oxycodone, oxycodone-acetaminophen , and pork allergy.  MEDICATIONS:  Current Outpatient Medications  Medication Sig Dispense Refill   acetaminophen  (TYLENOL ) 500 MG tablet Take 1-2 tablets (500-1,000 mg total) by mouth every 6 (six) hours as needed for moderate pain. 30 tablet 0   allopurinol  (ZYLOPRIM ) 100 MG tablet Take 150 mg by mouth daily.     amLODipine  (NORVASC ) 10 MG tablet Take 1 tablet (10 mg total) by mouth daily. 90 tablet 3   busPIRone  (BUSPAR ) 30 MG tablet Take 30 mg by mouth 2 (two) times daily.     butalbital-acetaminophen -caffeine (FIORICET) 50-325-40 MG tablet Take 1 tablet by mouth every 6 (six) hours as needed for headache.     cetirizine  (ZYRTEC  ALLERGY) 10 MG tablet Take 1 tablet (10 mg total) by mouth daily. 30 tablet 0   clopidogrel  (PLAVIX ) 75 MG tablet Take 1 tablet (75 mg total) by mouth daily. Okay to restart this medication on 08/22/2023.     dextromethorphan  (DELSYM ) 30 MG/5ML liquid Take 5 mLs (30 mg total) by mouth 2 (two) times daily as needed for cough. 89 mL 0   ferrous sulfate  (FEROSUL) 325 (65 FE) MG tablet TAKE 1 TABLET (325 MG TOTAL) BY MOUTH DAILY WITH BREAKFAST. 28 tablet 2   fluticasone  (FLONASE ) 50 MCG/ACT nasal spray Place 1 spray into both nostrils daily. 16 g 2   folic acid  (FOLVITE ) 1  MG tablet Take 1 mg by mouth daily.     furosemide  (LASIX ) 40 MG tablet Take 1 tablet (40 mg total) by mouth daily. 90 tablet 3   gabapentin  (NEURONTIN ) 300 MG capsule Take 1 capsule (300 mg total) by mouth 3 (three) times daily. 90 capsule 0   hydrALAZINE  (APRESOLINE ) 10 MG tablet Take 1 tablet (10 mg total) by mouth 2 (two) times daily. 180 tablet 3   HYDROcodone  bit-homatropine (HYCODAN) 5-1.5 MG/5ML syrup Take 5 mLs by mouth every 4 (four) hours as needed (Persistent cough). 120 mL 0   hydrocortisone cream 1 %  Apply 1 Application topically.     Hydroxychloroquine  Sulfate 400 MG TABS Take 400 mg by mouth at bedtime.     ketoconazole (NIZORAL) 2 % cream Apply 1 Application topically daily.     Lactobacillus (ACIDOPHILUS) CAPS capsule Take 1 capsule by mouth daily. 100 capsule 0   lidocaine  (LIDODERM ) 5 % Place 1 patch onto the skin daily as needed (for pain- Remove & Discard patch within 12 hours or as directed by MD). 30 patch 3   lidocaine  (XYLOCAINE ) 5 % ointment Apply 1 Application topically as needed. 35.44 g 0   loperamide (IMODIUM) 2 MG capsule 2 mg as needed.     methocarbamol  (ROBAXIN ) 500 MG tablet Take 1 tablet (500 mg total) by mouth 2 (two) times daily. 20 tablet 0   Multiple Vitamin (MULTIVITAMIN) TABS TAKE 1 TABLET BY MOUTH DAILY. 27 tablet 1   PARoxetine  (PAXIL -CR) 37.5 MG 24 hr tablet Take 1 tablet (37.5 mg total) by mouth at bedtime.     primidone  (MYSOLINE ) 250 MG tablet TAKE 1 TABLET (250 MG TOTAL) BY MOUTH 2 (TWO) TIMES DAILY. 60 tablet 3   QUEtiapine  (SEROQUEL ) 25 MG tablet Take 25 mg by mouth at bedtime.     rosuvastatin  (CRESTOR ) 40 MG tablet Take 40 mg by mouth daily.     traZODone  (DESYREL ) 100 MG tablet Take 100 mg by mouth at bedtime.     valACYclovir  (VALTREX ) 1000 MG tablet Take 1,000 mg by mouth daily.     Vitamin D , Ergocalciferol , (DRISDOL ) 1.25 MG (50000 UNIT) CAPS capsule Take 50,000 Units by mouth every Saturday.     Wound Cleansers (RADIAPLEX EX) Apply topically as needed.     No current facility-administered medications for this visit.   Facility-Administered Medications Ordered in Other Visits  Medication Dose Route Frequency Provider Last Rate Last Admin   0.9 %  sodium chloride  infusion   Intravenous Continuous Sherrod Sherrod, MD   Stopped at 01/23/24 1542    VITAL SIGNS: There were no vitals taken for this visit. There were no vitals filed for this visit.  Estimated body mass index is 31.92 kg/m as calculated from the following:   Height as of an  earlier encounter on 01/23/24: 5' 7 (1.702 m).   Weight as of an earlier encounter on 01/23/24: 203 lb 12.8 oz (92.4 kg).  PERFORMANCE STATUS (ECOG) : 1 - Symptomatic but completely ambulatory  Physical Exam General: NAD Cardiovascular: regular rate and rhythm Pulmonary: normal breathing pattern Extremities: no edema, no joint deformities Skin: no rashes Neurological: AAO x3  IMPRESSION: Discussed the use of AI scribe software for clinical note transcription with the patient, who gave verbal consent to proceed.  History of Present Illness [name] is a 73 year old male with stage IIIa non-small cell lung cancer who was seen during infusion for symptom management follow-up. No family present. Denies nausea, vomiting, constipation,  or diarrhea. Appetite is fair. Weight is stable at 203lbs.   He is concerned about the potential progression to stage four cancer, referencing a family history where a cousin with stage four cancer had a rapid decline. He is contemplating the impact of further radiation treatment, as previous radiation has caused muscle burns and pain. [name] is emotional expressing during recent visit with Oncologist he was informed of concerns of new lesion on his adrenal gland.   We discussed his pain at length. He reports his pain is worsening. He finds some relief with use of gabapentin  three times daily however pain continues to remain intense. He feels the dosage needs to be increased as he is 'always in pain'. He has run out of his pain patches and is awaiting a new prescription from his company. I have recommended he increase gabapentin  to 2 capsules (600mg ) at bedtime for the next 4 days. If he tolerates without difficulty he will then increase to 2 capsules twice daily. We discussed considering oxycodone or morphine . Patient shares previous experiences and inability to take morphine  due to lethargy and hallucinations.   We will continue to closely monitor and  support. All questions answered and support provided.   Assessment & Plan Malignant neoplasm with adrenal metastasis New PET scan reveals adrenal gland metastasis, potentially reclassifying cancer to stage four. Per patient Biopsy tentatively  planned to confirm malignancy. Stage four cancer is incurable but manageable, with prognosis varying by cancer type and location. He is considering radiation therapy, concerned about previous radiation-induced muscle pain. - Will proceed with biopsy of adrenal gland lesion - Will discuss potential radiation therapy options  Chronic neoplasm-related pain Persistent pain likely related to neoplasm. Current gabapentin  regimen is insufficient. He has not been using prescribed patches due to pharmacy issues. Gabapentin  dosage adjustment is planned to improve pain control. - Increased gabapentin  dosage to two tablets at bedtime until Saturday - If tolerated, increase gabapentin  to two tablets in the morning and two at bedtime starting Saturday - Will call him next week to assess pain control and medication tolerance  Patient expressed understanding and was in agreement with this plan. He also understands that He can call the clinic at any time with any questions, concerns, or complaints.   Any controlled substances utilized were prescribed in the context of palliative care. PDMP has been reviewed.   I personally spent a total of 45 minutes in the care of the patient today including preparing to see the patient, getting/reviewing separately obtained history, performing a medically appropriate exam/evaluation, counseling and educating, placing orders, referring and communicating with other health care professionals, independently interpreting results, and coordinating care. Visit consisted of counseling and education dealing with the complex and emotionally intense issues of symptom management and palliative care in the setting of serious and potentially  life-threatening illness.  Levon Borer, AGPCNP-BC  Palliative Medicine Team/ Cancer Center    "

## 2024-01-24 ENCOUNTER — Encounter: Payer: Self-pay | Admitting: Radiology

## 2024-01-24 LAB — T4: T4, Total: 8.5 ug/dL (ref 4.5–12.0)

## 2024-01-24 NOTE — Progress Notes (Signed)
 [name] POUR, MD  [name] Approved for ATTEMPT at CT BX of RIGHT ADRENAL NODULE concerning for metastatic non small cell lung cancer.  Lesion is small and successful biopsy not guaranteed.  PET CT image 93-94 of series 4 and 603.  Moderate sedation.  HKM       Previous Messages    ----- Message ----- From: [name] Sent: 01/23/2024   3:48 PM EST To: Ferol JULIANNA Conger, RT; Ir Procedure Requests Subject: CT BIOPSY                                      Procedure :CT BIOPSY  Reason :Suspicious right adrenal gland metastasis for biopsy Dx: Adenocarcinoma of upper lobe of left lung (HCC) [C34.12 (ICD-10-CM)]    History :NM PET Image Restag (PS) Skull Base To Thigh,CT Chest W Contrast ,DG Chest 2 View,IR IMAGING GUIDED PORT INSERTION,  Provider: Sherrod Sherrod, MD  Provider contact ;  [phone]

## 2024-01-25 ENCOUNTER — Encounter: Payer: Self-pay | Admitting: Physician Assistant

## 2024-01-25 ENCOUNTER — Encounter: Payer: Self-pay | Admitting: Internal Medicine

## 2024-01-26 ENCOUNTER — Telehealth: Payer: Self-pay | Admitting: Emergency Medicine

## 2024-01-26 NOTE — Telephone Encounter (Signed)
 Dr Shelah- [facility] asking for ok to hold plavix  x 5 days prior to CT biopsy  When he was seen last in July 2025 the ok was given to hold prior to bronch   I just want to be sure this is still ok, thanks

## 2024-01-29 NOTE — Telephone Encounter (Signed)
 Yes thank you - this is ok to hold plavix  x 5 d

## 2024-01-31 ENCOUNTER — Encounter (HOSPITAL_COMMUNITY): Payer: Self-pay

## 2024-01-31 ENCOUNTER — Ambulatory Visit (HOSPITAL_COMMUNITY)
Admission: RE | Admit: 2024-01-31 | Discharge: 2024-01-31 | Disposition: A | Discharge status: Home / Self Care | Source: Ambulatory Visit | Attending: Internal Medicine | Admitting: Internal Medicine

## 2024-01-31 DIAGNOSIS — Z01818 Encounter for other preprocedural examination: Secondary | ICD-10-CM

## 2024-01-31 DIAGNOSIS — C3412 Malignant neoplasm of upper lobe, left bronchus or lung: Secondary | ICD-10-CM

## 2024-01-31 MED ORDER — SODIUM CHLORIDE 0.9 % IV SOLN: INTRAVENOUS | Status: DC

## 2024-01-31 NOTE — Telephone Encounter (Signed)
 Copy of this encounter was faxed to [facility].

## 2024-01-31 NOTE — Progress Notes (Signed)
 Patient ID: [name], male   DOB: June 28, 1950, 74 y.o.   MRN: [id number] Patient arrived today for planned right adrenal gland biopsy. Unfortunately, patient took his Plavix  yesterday, as he was unaware he needed to hold 5 days prior to the procedure. Unfortunately IR will not be able to do the biopsy today. Spoke with patient and family at bedside who were disappointed they were not told to hold. Informed IR schedulers to re-schedule patient for next available day after 5 day hold of plavix , and informed to ensure patient/family know this as well.   Kimble DEL Mardella Nuckles PA-C 01/31/2024 9:13 AM

## 2024-02-05 ENCOUNTER — Encounter: Payer: Self-pay | Admitting: Emergency Medicine

## 2024-02-05 ENCOUNTER — Encounter: Payer: Self-pay | Admitting: Physician Assistant

## 2024-02-05 ENCOUNTER — Encounter: Payer: Self-pay | Admitting: Internal Medicine

## 2024-02-05 ENCOUNTER — Ambulatory Visit
Admission: EM | Admit: 2024-02-05 | Discharge: 2024-02-05 | Disposition: A | Discharge status: Home / Self Care | Source: Home / Self Care

## 2024-02-05 DIAGNOSIS — K5900 Constipation, unspecified: Secondary | ICD-10-CM | POA: Diagnosis not present

## 2024-02-05 DIAGNOSIS — K649 Unspecified hemorrhoids: Secondary | ICD-10-CM

## 2024-02-05 HISTORY — DX: Malignant (primary) neoplasm, unspecified: C80.1

## 2024-02-05 MED ORDER — HYDROCORTISONE (PERIANAL) 2.5 % EX CREA: 1.0000 | TOPICAL_CREAM | Freq: Two times a day (BID) | CUTANEOUS | 0 refills | Status: AC | PRN

## 2024-02-05 MED ORDER — HYDROCORTISONE ACETATE 25 MG RE SUPP: 25.0000 mg | Freq: Two times a day (BID) | RECTAL | 0 refills | Status: AC | PRN

## 2024-02-05 NOTE — ED Provider Notes (Signed)
 " [facility]    CSN: [id number] Arrival date & time: 02/05/24  9075      History   Chief Complaint Chief Complaint  Patient presents with   Rectal Pain    HPI [name] is a 74 y.o. male presents for rectal pain.  Patient has a complex medical history listed below but does include current lung cancer with possible metastasis.  He reports 2 days of rectal pain that is persistent and worse with bowel movements.  He does endorse constipation/straining and thinks it is related to the iron supplements he is taking.  He does not take narcotics and only uses gabapentin  for pain.  He denies any abdominal pain, nausea/vomiting or diarrhea.  No blood with wiping or in the stool.  He does have a history of hemorrhoids many years ago and states this feels similar.  He has not used any OTC treatments for symptoms since onset.  No other concerns.  HPI  Past Medical History:  Diagnosis Date   (HFpEF) heart failure with preserved ejection fraction (HCC)    Allergy    Anxiety    Arthritis    Cancer (HCC)    Chronic kidney disease    Depression    Frequent urination    GERD (gastroesophageal reflux disease)    Gout    Headache    mirgaines in the past   Herpes    History of nuclear stress test    Myoview  05/2021: EF 77, normal perfusion, low risk   Hyperlipidemia    Hypertension    IBS (irritable bowel syndrome)    Memory loss    MGUS (monoclonal gammopathy of unknown significance)    Obesity    Pre-diabetes    Stroke [facility])    Urgency of urination     Patient Active Problem List   Diagnosis Date Noted   Gouty arthritis 11/28/2023   Shoulder joint pain 11/28/2023   Iron deficiency anemia 09/13/2023   IDA (iron deficiency anemia) 09/11/2023   Adenocarcinoma of upper lobe of left lung (HCC) 08/29/2023   Hemoptysis 08/28/2023   Mass of upper lobe of left lung 08/10/2023   Trigger point 02/16/2023   MGUS (monoclonal gammopathy of unknown significance) 11/29/2022    History of total right knee replacement 08/15/2022   Bilateral impacted cerumen 07/15/2022   OA (osteoarthritis) of knee 11/15/2021   Primary osteoarthritis of right knee 11/15/2021   Mixed hyperlipidemia 09/24/2021   AKI (acute kidney injury) 04/09/2021   Recurrent major depressive disorder, in full remission 01/07/2020   Chronic heart failure with preserved ejection fraction (HCC) 01/02/2020   Essential hypertension 01/02/2020   Right upper quadrant abdominal pain 12/16/2019   Precordial chest pain 12/15/2019   Elevated transaminase level 12/15/2019   Anxiety 02/27/2019   MDD (major depressive disorder), recurrent, in partial remission 02/07/2019   Severe recurrent major depression without psychotic features (HCC) 02/02/2019   Tardive dyskinesia 12/03/2018   Arthritis of wrist, left 07/10/2017   Pure hypercholesterolemia 06/14/2017   Arthritis of wrist, right 02/22/2017   Otalgia of both ears 01/30/2017   Sensorineural hearing loss (SNHL), bilateral 01/30/2017   History of stroke 11/11/2015   Other instability, left wrist 08/28/2015   Pain of left hand 08/28/2015   Closed nondisplaced fracture of proximal phalanx of left thumb 07/17/2015   Osteoarthritis of finger of left hand 07/17/2015   Pain 07/17/2015   Primary osteoarthritis of first carpometacarpal joint of left hand 07/17/2015   Memory loss 10/16/2013  Tremor 04/04/2013   Borderline personality disorder (HCC) 03/02/2011   Depression 03/02/2011    Past Surgical History:  Procedure Laterality Date   CHOLECYSTECTOMY N/A 12/17/2019   Procedure: LAPAROSCOPIC CHOLECYSTECTOMY WITH ICG INJECTION;  Surgeon: Ebbie Cough, MD;  Location: [facility] OR;  Service: General;  Laterality: N/A;   disckec     IR IMAGING GUIDED PORT INSERTION  09/12/2023   shouler arthroscopy Left    SPINAL FUSION     TONSILLECTOMY     TOTAL KNEE ARTHROPLASTY Right 11/15/2021   Procedure: TOTAL KNEE ARTHROPLASTY;  Surgeon: Melodi Lerner, MD;   Location: [facility] ORS;  Service: Orthopedics;  Laterality: Right;   VIDEO BRONCHOSCOPY WITH ENDOBRONCHIAL NAVIGATION Left 08/21/2023   Procedure: VIDEO BRONCHOSCOPY WITH ENDOBRONCHIAL NAVIGATION;  Surgeon: Shelah Lamar RAMAN, MD;  Location: [facility] ENDOSCOPY;  Service: Pulmonary;  Laterality: Left;   VIDEO BRONCHOSCOPY WITH ENDOBRONCHIAL ULTRASOUND  08/21/2023   Procedure: BRONCHOSCOPY, WITH EBUS;  Surgeon: Shelah Lamar RAMAN, MD;  Location: [facility] ENDOSCOPY;  Service: Pulmonary;;   VIDEO BRONCHOSCOPY WITH RADIAL ENDOBRONCHIAL ULTRASOUND  08/21/2023   Procedure: VIDEO BRONCHOSCOPY WITH RADIAL ENDOBRONCHIAL ULTRASOUND;  Surgeon: Shelah Lamar RAMAN, MD;  Location: [facility] ENDOSCOPY;  Service: Pulmonary;;   WISDOM TOOTH EXTRACTION         Home Medications    Prior to Admission medications  Medication Sig Start Date End Date Taking? Authorizing Provider  hydrocortisone  (ANUSOL -HC) 2.5 % rectal cream Place 1 Application rectally 2 (two) times daily as needed for hemorrhoids. 02/05/24  Yes Milen Lengacher, Jodi R, NP  hydrocortisone  (ANUSOL -HC) 25 MG suppository Place 1 suppository (25 mg total) rectally 2 (two) times daily as needed for hemorrhoids. 02/05/24  Yes Atreus Hasz, Jodi R, NP  acetaminophen  (TYLENOL ) 500 MG tablet Take 1-2 tablets (500-1,000 mg total) by mouth every 6 (six) hours as needed for moderate pain. 11/18/21   Edmisten, Kristie L, PA  allopurinol  (ZYLOPRIM ) 100 MG tablet Take 150 mg by mouth daily. 09/14/22   [provider]  amLODipine  (NORVASC ) 10 MG tablet Take 1 tablet (10 mg total) by mouth daily. 07/26/23   Delford Maude BROCKS, MD  busPIRone  (BUSPAR ) 30 MG tablet Take 30 mg by mouth 2 (two) times daily.    [provider]  butalbital-acetaminophen -caffeine (FIORICET) 50-325-40 MG tablet Take 1 tablet by mouth every 6 (six) hours as needed for headache.    [provider]  cetirizine  (ZYRTEC  ALLERGY) 10 MG tablet Take 1 tablet (10 mg total) by mouth daily. 04/25/22   Christopher Savannah, PA-C  clopidogrel  (PLAVIX )  75 MG tablet Take 1 tablet (75 mg total) by mouth daily. Okay to restart this medication on 08/22/2023. 08/21/23   Shelah Lamar RAMAN, MD  dextromethorphan  (DELSYM ) 30 MG/5ML liquid Take 5 mLs (30 mg total) by mouth 2 (two) times daily as needed for cough. 08/27/23   Randol Simmonds, MD  ferrous sulfate  (FEROSUL) 325 (65 FE) MG tablet TAKE 1 TABLET (325 MG TOTAL) BY MOUTH DAILY WITH BREAKFAST. 01/15/24   Heilingoetter, Cassandra L, PA-C  fluticasone  (FLONASE ) 50 MCG/ACT nasal spray Place 1 spray into both nostrils daily. 09/02/23   Jadine Toribio SQUIBB, MD  folic acid  (FOLVITE ) 1 MG tablet Take 1 mg by mouth daily. 11/10/21   [provider]  furosemide  (LASIX ) 40 MG tablet Take 1 tablet (40 mg total) by mouth daily. 07/13/23   Santo Stanly LABOR, MD  gabapentin  (NEURONTIN ) 300 MG capsule Take 1 capsule (300 mg total) by mouth 3 (three) times daily. 01/16/24   Pickenpack-Cousar, Fannie SAILOR, NP  hydrALAZINE  (APRESOLINE ) 10 MG tablet Take 1 tablet (10 mg total) by mouth 2 (two) times daily. 11/20/23   Lelon Hamilton T, PA-C  HYDROcodone  bit-homatropine (HYCODAN) 5-1.5 MG/5ML syrup Take 5 mLs by mouth every 4 (four) hours as needed (Persistent cough). 09/01/23   Jadine Toribio SQUIBB, MD  hydrocortisone  cream 1 % Apply 1 Application topically. 11/23/23   [provider]  Hydroxychloroquine  Sulfate 400 MG TABS Take 400 mg by mouth at bedtime.    [provider]  ketoconazole (NIZORAL) 2 % cream Apply 1 Application topically daily. 07/31/23   [provider]  Lactobacillus (ACIDOPHILUS) CAPS capsule Take 1 capsule by mouth daily. 07/03/23   Vann, Jessica U, DO  lidocaine  (LIDODERM ) 5 % Place 1 patch onto the skin daily as needed (for pain- Remove & Discard patch within 12 hours or as directed by MD). 01/16/24   Pickenpack-Cousar, Athena N, NP  lidocaine  (XYLOCAINE ) 5 % ointment Apply 1 Application topically as needed. 11/18/23   Dasie Faden, MD  loperamide (IMODIUM) 2 MG capsule 2 mg as  needed.    [provider]  methocarbamol  (ROBAXIN ) 500 MG tablet Take 1 tablet (500 mg total) by mouth 2 (two) times daily. 12/19/23   Wyatt Leeroy HERO, PA-C  Multiple Vitamin (MULTIVITAMIN) TABS TAKE 1 TABLET BY MOUTH DAILY. 11/15/23   Sherrod Sherrod, MD  PARoxetine  (PAXIL -CR) 37.5 MG 24 hr tablet Take 1 tablet (37.5 mg total) by mouth at bedtime. 08/21/23   Shelah Lamar RAMAN, MD  primidone  (MYSOLINE ) 250 MG tablet TAKE 1 TABLET (250 MG TOTAL) BY MOUTH 2 (TWO) TIMES DAILY. 12/06/23   Rosemarie Eather RAMAN, MD  QUEtiapine  (SEROQUEL ) 25 MG tablet Take 25 mg by mouth at bedtime.    [provider]  rosuvastatin  (CRESTOR ) 40 MG tablet Take 40 mg by mouth daily.    [provider]  traZODone  (DESYREL ) 100 MG tablet Take 100 mg by mouth at bedtime.    [provider]  valACYclovir  (VALTREX ) 1000 MG tablet Take 1,000 mg by mouth daily.    [provider]  Vitamin D , Ergocalciferol , (DRISDOL ) 1.25 MG (50000 UNIT) CAPS capsule Take 50,000 Units by mouth every Saturday. 09/17/20   [provider]  Wound Cleansers (RADIAPLEX EX) Apply topically as needed.    [provider]    Family History Family History  Problem Relation Age of Onset   Hypertension Mother    Hypertension Father    Hypertension Sister    Hypertension Brother    Cancer Maternal Aunt    Cancer - Ovarian Maternal Grandmother    Diabetes Mellitus I Paternal Grandmother    Hypertension Paternal Grandfather    Cancer Maternal Aunt     Social History Social History[1]   Allergies   Divalproex sodium, Lamotrigine, Valproic acid, Amitriptyline, Aspirin , Hydrocodone , Lisinopril, Lithium, Morphine , Nsaids, Oxycodone, Oxycodone-acetaminophen , and Pork allergy   Review of Systems Review of Systems  Gastrointestinal:  Positive for rectal pain.     Physical Exam Triage Vital Signs ED Triage Vitals [02/05/24 0940]  Encounter Vitals Group     BP 126/78     Girls Systolic BP  Percentile      Girls Diastolic BP Percentile      Boys Systolic BP Percentile      Boys Diastolic BP Percentile      Pulse Rate 96     Resp 18     Temp 98.3 F (36.8 C)     Temp Source Oral  SpO2 93 %     Weight 199 lb 1.6 oz (90.3 kg)     Height 5' 7 (1.702 m)     Head Circumference      Peak Flow      Pain Score 9     Pain Loc      Pain Education      Exclude from Growth Chart    No data found.  Updated Vital Signs BP 126/78 (BP Location: Right Arm)   Pulse 96   Temp 98.3 F (36.8 C) (Oral)   Resp 18   Ht 5' 7 (1.702 m)   Wt 199 lb 1.6 oz (90.3 kg)   SpO2 93%   BMI 31.18 kg/m   Visual Acuity Right Eye Distance:   Left Eye Distance:   Bilateral Distance:    Right Eye Near:   Left Eye Near:    Bilateral Near:     Physical Exam Vitals and nursing note reviewed. Exam conducted with a chaperone present (Adrianna CMA).  Constitutional:      General: He is not in acute distress.    Appearance: Normal appearance. He is not ill-appearing.  HENT:     Head: Normocephalic and atraumatic.  Eyes:     Pupils: Pupils are equal, round, and reactive to light.  Cardiovascular:     Rate and Rhythm: Normal rate.  Pulmonary:     Effort: Pulmonary effort is normal.  Genitourinary:    Rectum: Tenderness and external hemorrhoid present. No anal fissure.   Skin:    General: Skin is warm and dry.  Neurological:     General: No focal deficit present.     Mental Status: He is alert and oriented to person, place, and time.  Psychiatric:        Mood and Affect: Mood normal.        Behavior: Behavior normal.      UC Treatments / Results  Labs (all labs ordered are listed, but only abnormal results are displayed) Labs Reviewed - No data to display  EKG   Radiology No results found.  Procedures Procedures (including critical care time)  Medications Ordered in UC Medications - No data to display  Initial Impression / Assessment and Plan / UC Course  I have  reviewed the triage vital signs and the nursing notes.  Pertinent labs & imaging results that were available during my care of the patient were reviewed by me and considered in my medical decision making (see chart for details).     Reviewed exam and symptoms with patient.  Will do Anusol  topical twice daily as needed as well as suppositories twice daily as needed.  Start MiraLAX  OTC for constipation.  Encouraged increased fiber and avoidance of straining.  Also reviewed OTC witch hazel pads and sitz bath's.  PCP follow-up 2 days for recheck.  ER precautions reviewed. Final Clinical Impressions(s) / UC Diagnoses   Final diagnoses:  Constipation, unspecified constipation type  Hemorrhoids, unspecified hemorrhoid type     Discharge Instructions      You may use the hydrocortisone  cream externally to the rectum twice daily as needed for your hemorrhoid pain.  May also use the hydrocortisone  suppositories twice daily as needed.  Start MiraLAX  over-the-counter to help with your constipation.  Increase your fiber as well.  Avoid straining.  You may use over-the-counter witch hazel pads as well to help soothe the area as well as sitz bath's.  Follow-up with your PCP in 2 days for recheck.  Please go to the emergency room for worsening symptoms.  Hope you feel better soon!     ED Prescriptions     Medication Sig Dispense Auth. Provider   hydrocortisone  (ANUSOL -HC) 25 MG suppository Place 1 suppository (25 mg total) rectally 2 (two) times daily as needed for hemorrhoids. 12 suppository Whittaker Lenis, Jodi R, NP   hydrocortisone  (ANUSOL -HC) 2.5 % rectal cream Place 1 Application rectally 2 (two) times daily as needed for hemorrhoids. 30 g Juniper Cobey, Jodi R, NP      PDMP not reviewed this encounter.     [1]  Social History Tobacco Use   Smoking status: Former    Current packs/day: 0.00    Types: Cigarettes    Quit date: 1990    Years since quitting: 36.0   Smokeless tobacco: Never  Vaping Use    Vaping status: Never Used  Substance Use Topics   Alcohol  use: No    Alcohol /week: 1.0 standard drink of alcohol     Types: 1 Cans of beer per week   Drug use: No     Loreda Myla SAUNDERS, NP 02/05/24 1006  "

## 2024-02-05 NOTE — Discharge Instructions (Addendum)
 You may use the hydrocortisone  cream externally to the rectum twice daily as needed for your hemorrhoid pain.  May also use the hydrocortisone  suppositories twice daily as needed.  Start MiraLAX  over-the-counter to help with your constipation.  Increase your fiber as well.  Avoid straining.  You may use over-the-counter witch hazel pads as well to help soothe the area as well as sitz bath's.  Follow-up with your PCP in 2 days for recheck.  Please go to the emergency room for worsening symptoms.  Hope you feel better soon!

## 2024-02-05 NOTE — ED Triage Notes (Signed)
 Pt c/o rectal pain x's 2 days.  Pt denies any bleeding.  Last BM this am  small amount.  Pt st's he has hx of cancer (lung)

## 2024-02-06 ENCOUNTER — Encounter: Payer: Self-pay | Admitting: Physician Assistant

## 2024-02-06 ENCOUNTER — Encounter: Payer: Self-pay | Admitting: Internal Medicine

## 2024-02-07 ENCOUNTER — Other Ambulatory Visit: Payer: Self-pay | Admitting: Nurse Practitioner

## 2024-02-07 DIAGNOSIS — R071 Chest pain on breathing: Secondary | ICD-10-CM

## 2024-02-07 DIAGNOSIS — Z515 Encounter for palliative care: Secondary | ICD-10-CM

## 2024-02-07 DIAGNOSIS — M792 Neuralgia and neuritis, unspecified: Secondary | ICD-10-CM

## 2024-02-07 DIAGNOSIS — C3412 Malignant neoplasm of upper lobe, left bronchus or lung: Secondary | ICD-10-CM

## 2024-02-07 MED ORDER — GABAPENTIN 300 MG PO CAPS: 600.0000 mg | ORAL_CAPSULE | Freq: Two times a day (BID) | ORAL | 0 refills | Status: AC

## 2024-02-08 ENCOUNTER — Ambulatory Visit: Payer: Self-pay

## 2024-02-08 ENCOUNTER — Emergency Department (HOSPITAL_COMMUNITY)
Admission: EM | Admit: 2024-02-08 | Discharge: 2024-02-09 | Disposition: A | Discharge status: Home / Self Care | Attending: Emergency Medicine | Admitting: Emergency Medicine

## 2024-02-08 DIAGNOSIS — Z79899 Other long term (current) drug therapy: Secondary | ICD-10-CM | POA: Insufficient documentation

## 2024-02-08 DIAGNOSIS — K610 Anal abscess: Secondary | ICD-10-CM | POA: Insufficient documentation

## 2024-02-08 DIAGNOSIS — R079 Chest pain, unspecified: Secondary | ICD-10-CM

## 2024-02-08 DIAGNOSIS — N189 Chronic kidney disease, unspecified: Secondary | ICD-10-CM | POA: Insufficient documentation

## 2024-02-08 DIAGNOSIS — N289 Disorder of kidney and ureter, unspecified: Secondary | ICD-10-CM

## 2024-02-08 DIAGNOSIS — I130 Hypertensive heart and chronic kidney disease with heart failure and stage 1 through stage 4 chronic kidney disease, or unspecified chronic kidney disease: Secondary | ICD-10-CM | POA: Insufficient documentation

## 2024-02-08 DIAGNOSIS — Z85118 Personal history of other malignant neoplasm of bronchus and lung: Secondary | ICD-10-CM | POA: Insufficient documentation

## 2024-02-08 DIAGNOSIS — D649 Anemia, unspecified: Secondary | ICD-10-CM | POA: Insufficient documentation

## 2024-02-08 DIAGNOSIS — I509 Heart failure, unspecified: Secondary | ICD-10-CM | POA: Insufficient documentation

## 2024-02-08 DIAGNOSIS — R7309 Other abnormal glucose: Secondary | ICD-10-CM | POA: Insufficient documentation

## 2024-02-08 DIAGNOSIS — E876 Hypokalemia: Secondary | ICD-10-CM | POA: Insufficient documentation

## 2024-02-08 DIAGNOSIS — R739 Hyperglycemia, unspecified: Secondary | ICD-10-CM

## 2024-02-08 DIAGNOSIS — J90 Pleural effusion, not elsewhere classified: Secondary | ICD-10-CM | POA: Insufficient documentation

## 2024-02-08 NOTE — ED Triage Notes (Incomplete)
 Patient arrives via [facility] from [facility] for left side chest pain that started tonight when he laid down. Pain resolved on ems arrival. He describes the pain as stabbing.   BP 160/80, HR 86

## 2024-02-08 NOTE — Telephone Encounter (Signed)
 FYI Only or Action Required?: FYI only for provider: appointment scheduled on 03/01/24.  Patient was last seen in primary care on Not established.  Called Nurse Triage reporting Hemorrhoids.  Symptoms began several days ago.  Interventions attempted: Other: Went to UC, has not done care advice.  Symptoms are: unchanged.  Triage Disposition: Home Care (overriding See Physician Within 24 Hours)  Patient/caregiver understands and will follow disposition?: Yes   Reason for Disposition  MODERATE-SEVERE rectal pain (i.e., interferes with school, work, or sleep)  Answer Assessment - Initial Assessment Questions Pt looking to establish care with a good [place] doctor. New pt appointment set up, added to wait list. Care advice given to use ice, witch hazel pads, Miralax  and continue the hydrocortisone  cream given at [facility] on 02/05/24  1. SYMPTOM:  What's the main symptom you're concerned about? (e.g., pain, itching, swelling, rash)     Severe pain  2. ONSET: When did the severe pain start?     4 days ago  3. RECTAL PAIN: Do you have any pain around your rectum? How bad is the pain?  (Scale 0-10; or none, mild, moderate, severe)     8-9/10  4. RECTAL ITCHING: Do you have any itching in this area? How bad is the itching?  (Scale 0-10; or none, mild, moderate, severe)     Denies  5. CONSTIPATION: Do you have constipation? If Yes, ask: How often do you have a bowel movement (BM)?  (Normal range: 3 times a day to every 3 days)  When was your last BM?       Constipation, had a small bowel movement today  6. CAUSE: What do you think is causing the anus symptoms?     Pt thinks its a blood clot, from previous experience  7. OTHER SYMPTOMS: Do you have any other symptoms?  (e.g., abdomen pain, fever, rectal bleeding, vomiting)     Denies  Protocols used: Rectal Symptoms-A-AH  Message from Miquel SAILOR sent at 02/08/2024  2:16 PM EST  Reason for Triage: PT has severe  hemorrhoids/Hard oval shape top of anus/Severe pain on anus

## 2024-02-08 NOTE — ED Triage Notes (Addendum)
 Patient arrives via [facility] from [facility] for left side chest pain that started tonight when he laid down. Pain resolved on ems arrival. He describes the pain as stabbing. Currently receiving chemo infusion for adenocarcinoma - biopsy scheduled for new possible spot on adrenal gland.   BP 160/80, HR 86, SPO2 95%, CBG 170

## 2024-02-09 ENCOUNTER — Emergency Department (HOSPITAL_COMMUNITY)

## 2024-02-09 ENCOUNTER — Other Ambulatory Visit: Payer: Self-pay

## 2024-02-09 ENCOUNTER — Encounter (HOSPITAL_COMMUNITY): Payer: Self-pay | Admitting: Emergency Medicine

## 2024-02-09 LAB — COMPREHENSIVE METABOLIC PANEL WITH GFR
ALT: 5 U/L (ref 0–44)
AST: 17 U/L (ref 15–41)
Albumin: 3.6 g/dL (ref 3.5–5.0)
Alkaline Phosphatase: 71 U/L (ref 38–126)
Anion gap: 11 (ref 5–15)
BUN: 11 mg/dL (ref 8–23)
CO2: 26 mmol/L (ref 22–32)
Calcium: 8.4 mg/dL — ABNORMAL LOW (ref 8.9–10.3)
Chloride: 100 mmol/L (ref 98–111)
Creatinine, Ser: 1.27 mg/dL — ABNORMAL HIGH (ref 0.61–1.24)
GFR, Estimated: >60 mL/min
Glucose, Bld: 109 mg/dL — ABNORMAL HIGH (ref 70–99)
Potassium: 3.4 mmol/L — ABNORMAL LOW (ref 3.5–5.1)
Sodium: 137 mmol/L (ref 135–145)
Total Bilirubin: <0.2 mg/dL (ref 0.0–1.2)
Total Protein: 6.9 g/dL (ref 6.5–8.1)

## 2024-02-09 LAB — CBC WITH DIFFERENTIAL/PLATELET
Abs Immature Granulocytes: 0.02 K/uL (ref 0.00–0.07)
Basophils Absolute: 0 K/uL (ref 0.0–0.1)
Basophils Relative: 1 %
Eosinophils Absolute: 0.2 K/uL (ref 0.0–0.5)
Eosinophils Relative: 3 %
HCT: 33.6 % — ABNORMAL LOW (ref 39.0–52.0)
Hemoglobin: 11.2 g/dL — ABNORMAL LOW (ref 13.0–17.0)
Immature Granulocytes: 0 %
Lymphocytes Relative: 13 %
Lymphs Abs: 1 K/uL (ref 0.7–4.0)
MCH: 31.4 pg (ref 26.0–34.0)
MCHC: 33.3 g/dL (ref 30.0–36.0)
MCV: 94.1 fL (ref 80.0–100.0)
Monocytes Absolute: 0.9 K/uL (ref 0.1–1.0)
Monocytes Relative: 12 %
Neutro Abs: 5 K/uL (ref 1.7–7.7)
Neutrophils Relative %: 71 %
Platelets: 238 K/uL (ref 150–400)
RBC: 3.57 MIL/uL — ABNORMAL LOW (ref 4.22–5.81)
RDW: 13.1 % (ref 11.5–15.5)
WBC: 7.1 K/uL (ref 4.0–10.5)
nRBC: 0 % (ref 0.0–0.2)

## 2024-02-09 LAB — TROPONIN T, HIGH SENSITIVITY
Troponin T High Sensitivity: 14 ng/L (ref 0–19)
Troponin T High Sensitivity: 15 ng/L (ref 0–19)

## 2024-02-09 MED ORDER — POTASSIUM CHLORIDE CRYS ER 20 MEQ PO TBCR: 20.0000 meq | EXTENDED_RELEASE_TABLET | Freq: Two times a day (BID) | ORAL | 0 refills | Status: AC

## 2024-02-09 MED ORDER — CEFDINIR 300 MG PO CAPS: 300.0000 mg | ORAL_CAPSULE | Freq: Two times a day (BID) | ORAL | 0 refills | Status: AC

## 2024-02-09 MED ORDER — LIDOCAINE-EPINEPHRINE (PF) 2 %-1:200000 IJ SOLN
20.0000 mL | Freq: Once | INTRAMUSCULAR | Status: AC
  Administered 2024-02-09: 20 mL
  Filled 2024-02-09: qty 20

## 2024-02-09 MED ORDER — IOHEXOL 350 MG/ML SOLN
75.0000 mL | Freq: Once | INTRAVENOUS | Status: AC | PRN
  Administered 2024-02-09: 50 mL via INTRAVENOUS

## 2024-02-09 MED ORDER — POTASSIUM CHLORIDE CRYS ER 20 MEQ PO TBCR
40.0000 meq | EXTENDED_RELEASE_TABLET | Freq: Once | ORAL | Status: AC
  Administered 2024-02-09: 40 meq via ORAL
  Filled 2024-02-09: qty 2

## 2024-02-09 MED ORDER — CEPHALEXIN 250 MG PO CAPS
500.0000 mg | ORAL_CAPSULE | Freq: Once | ORAL | Status: AC
  Administered 2024-02-09: 500 mg via ORAL
  Filled 2024-02-09: qty 2

## 2024-02-09 NOTE — Discharge Instructions (Addendum)
 You may apply ice and/or heat to the area where you are having chest pain.  You may take acetaminophen  and/or ibuprofen as needed for pain.

## 2024-02-09 NOTE — ED Provider Notes (Signed)
 " [place] EMERGENCY DEPARTMENT AT [facility] Provider Note   CSN: [id number] Arrival date & time: 02/08/24  2357     Patient presents with: Chest Pain   [name] is a 74 y.o. male.   The history is provided by the patient.  Chest Pain  He has history of hypertension, hyperlipidemia, stroke, lung cancer, heart failure with preserved ejection fraction, chronic kidney disease and comes in because of chest pain and anal pain.  He has been having chest pain for several months.  Pain will last 5-10 minutes at a time and is well localized in the midclavicular line in the left anterior lower chest.  He applies heat and it does seem to help.  There is no associated dyspnea, nausea, diaphoresis.  Pain is not related to body position, exertion, eating.  8 has awakened him at times.  It seems to be coming more frequently.  He is a non-smoker denies history of diabetes and denies family history of premature coronary atherosclerosis.  Anal pain has been present for about a week.  He went to urgent care and was told it was a hemorrhoid and he was told to apply hydrocortisone  cream which has not helped.  He denies any bleeding.  He does admit to straining at his bowels.    Prior to Admission medications  Medication Sig Start Date End Date Taking? Authorizing Provider  acetaminophen  (TYLENOL ) 500 MG tablet Take 1-2 tablets (500-1,000 mg total) by mouth every 6 (six) hours as needed for moderate pain. 11/18/21   Edmisten, Kristie L, PA  allopurinol  (ZYLOPRIM ) 100 MG tablet Take 150 mg by mouth daily. 09/14/22   [provider]  amLODipine  (NORVASC ) 10 MG tablet Take 1 tablet (10 mg total) by mouth daily. 07/26/23   Delford Maude BROCKS, MD  busPIRone  (BUSPAR ) 30 MG tablet Take 30 mg by mouth 2 (two) times daily.    [provider]  butalbital-acetaminophen -caffeine (FIORICET) 50-325-40 MG tablet Take 1 tablet by mouth every 6 (six) hours as needed for headache.    [provider]  cetirizine  (ZYRTEC  ALLERGY) 10 MG tablet Take 1 tablet (10 mg total) by mouth daily. 04/25/22   Christopher Savannah, PA-C  clopidogrel  (PLAVIX ) 75 MG tablet Take 1 tablet (75 mg total) by mouth daily. Okay to restart this medication on 08/22/2023. 08/21/23   Shelah Lamar RAMAN, MD  dextromethorphan  (DELSYM ) 30 MG/5ML liquid Take 5 mLs (30 mg total) by mouth 2 (two) times daily as needed for cough. 08/27/23   Randol Simmonds, MD  ferrous sulfate  (FEROSUL) 325 (65 FE) MG tablet TAKE 1 TABLET (325 MG TOTAL) BY MOUTH DAILY WITH BREAKFAST. 01/15/24   Heilingoetter, Cassandra L, PA-C  fluticasone  (FLONASE ) 50 MCG/ACT nasal spray Place 1 spray into both nostrils daily. 09/02/23   Jadine Toribio SQUIBB, MD  folic acid  (FOLVITE ) 1 MG tablet Take 1 mg by mouth daily. 11/10/21   [provider]  furosemide  (LASIX ) 40 MG tablet Take 1 tablet (40 mg total) by mouth daily. 07/13/23   Santo Stanly LABOR, MD  gabapentin  (NEURONTIN ) 300 MG capsule Take 2 capsules (600 mg total) by mouth 2 (two) times daily. 02/07/24   Pickenpack-Cousar, Athena N, NP  hydrALAZINE  (APRESOLINE ) 10 MG tablet Take 1 tablet (10 mg total) by mouth 2 (two) times daily. 11/20/23   Lelon Hamilton T, PA-C  HYDROcodone  bit-homatropine (HYCODAN) 5-1.5 MG/5ML syrup Take 5 mLs by mouth every 4 (four) hours as needed (Persistent cough). 09/01/23   Jadine,  Toribio SQUIBB, MD  hydrocortisone  (ANUSOL -HC) 2.5 % rectal cream Place 1 Application rectally 2 (two) times daily as needed for hemorrhoids. 02/05/24   Mayer, Jodi R, NP  hydrocortisone  (ANUSOL -HC) 25 MG suppository Place 1 suppository (25 mg total) rectally 2 (two) times daily as needed for hemorrhoids. 02/05/24   Mayer, Jodi R, NP  hydrocortisone  cream 1 % Apply 1 Application topically. 11/23/23   [provider]  Hydroxychloroquine  Sulfate 400 MG TABS Take 400 mg by mouth at bedtime.    [provider]  ketoconazole (NIZORAL) 2 % cream Apply 1 Application topically daily. 07/31/23    [provider]  Lactobacillus (ACIDOPHILUS) CAPS capsule Take 1 capsule by mouth daily. 07/03/23   Vann, Jessica U, DO  lidocaine  (LIDODERM ) 5 % Place 1 patch onto the skin daily as needed (for pain- Remove & Discard patch within 12 hours or as directed by MD). 01/16/24   Pickenpack-Cousar, Athena N, NP  lidocaine  (XYLOCAINE ) 5 % ointment Apply 1 Application topically as needed. 11/18/23   Dasie Faden, MD  loperamide (IMODIUM) 2 MG capsule 2 mg as needed.    [provider]  methocarbamol  (ROBAXIN ) 500 MG tablet Take 1 tablet (500 mg total) by mouth 2 (two) times daily. 12/19/23   Wyatt Leeroy HERO, PA-C  Multiple Vitamin (MULTIVITAMIN) TABS TAKE 1 TABLET BY MOUTH DAILY. 11/15/23   Sherrod Sherrod, MD  PARoxetine  (PAXIL -CR) 37.5 MG 24 hr tablet Take 1 tablet (37.5 mg total) by mouth at bedtime. 08/21/23   Shelah Lamar RAMAN, MD  primidone  (MYSOLINE ) 250 MG tablet TAKE 1 TABLET (250 MG TOTAL) BY MOUTH 2 (TWO) TIMES DAILY. 12/06/23   Rosemarie Eather RAMAN, MD  QUEtiapine  (SEROQUEL ) 25 MG tablet Take 25 mg by mouth at bedtime.    [provider]  rosuvastatin  (CRESTOR ) 40 MG tablet Take 40 mg by mouth daily.    [provider]  traZODone  (DESYREL ) 100 MG tablet Take 100 mg by mouth at bedtime.    [provider]  valACYclovir  (VALTREX ) 1000 MG tablet Take 1,000 mg by mouth daily.    [provider]  Vitamin D , Ergocalciferol , (DRISDOL ) 1.25 MG (50000 UNIT) CAPS capsule Take 50,000 Units by mouth every Saturday. 09/17/20   [provider]  Wound Cleansers (RADIAPLEX EX) Apply topically as needed.    [provider]    Allergies: Divalproex sodium, Lamotrigine, Valproic acid, Amitriptyline, Aspirin , Hydrocodone , Lisinopril, Lithium, Morphine , Nsaids, Oxycodone, Oxycodone-acetaminophen , and Pork allergy    Review of Systems  Cardiovascular:  Positive for chest pain.  All other systems reviewed and are negative.   Updated Vital Signs BP (!)  159/98   Pulse 84   Temp 98.9 F (37.2 C) (Oral)   Resp 20   SpO2 97%   Physical Exam Vitals and nursing note reviewed.   74 year old male, resting comfortably and in no acute distress. Vital signs are normal. Oxygen saturation is 92%, which is normal. Head is normocephalic and atraumatic. PERRLA, EOMI.  Lungs are clear without rales, wheezes, or rhonchi. Chest is tender in the midclavicular line on the left in the lower rib cage.  This site corresponds to where he feels his pain. Heart has regular rate and rhythm without murmur. Abdomen is soft, flat, nontender. Rectal: Perianal abscess is present, no hemorrhoid. Extremities have no cyanosis or edema, full range of motion is present. Skin is warm and dry without rash. Neurologic: Mental status is normal, cranial nerves are intact, moves all extremities equally.  (all  labs ordered are listed, but only abnormal results are displayed) Labs Reviewed  CBC WITH DIFFERENTIAL/PLATELET - Abnormal; Notable for the following components:      Result Value   RBC 3.57 (*)    Hemoglobin 11.2 (*)    HCT 33.6 (*)    All other components within normal limits  COMPREHENSIVE METABOLIC PANEL WITH GFR - Abnormal; Notable for the following components:   Potassium 3.4 (*)    Glucose, Bld 109 (*)    Creatinine, Ser 1.27 (*)    Calcium  8.4 (*)    All other components within normal limits  TROPONIN T, HIGH SENSITIVITY  TROPONIN T, HIGH SENSITIVITY    EKG: EKG Interpretation Date/Time:  Friday February 09 2024 00:22:55 EST Ventricular Rate:  81 PR Interval:  178 QRS Duration:  138 QT Interval:  424 QTC Calculation: 492 R Axis:   -73  Text Interpretation: ivc Normal sinus rhythm Right bundle branch block Left anterior fascicular block Bifascicular block Minimal voltage criteria for LVH, may be normal variant ( R in aVL ) Abnormal ECG When compared with ECG of 28-Aug-2023 15:06, Right bundle branch block has replaced Non-specific intra-ventricular  conduction delay Confirmed by Raford Lenis ([id number]) on 02/09/2024 2:00:31 AM  Radiology: CT Angio Chest PE W and/or Wo Contrast Result Date: 02/09/2024 EXAM: CTA of the Chest without and with contrast for PE 02/09/2024 01:54:23 AM TECHNIQUE: CTA of the chest was performed without and with the administration of 50 mL of iohexol  (OMNIPAQUE ) 350 mg/mL intravenous contrast. Multiplanar reformatted images are provided for review. MIP images are provided for review. Automated exposure control, iterative reconstruction, and/or weight based adjustment of the mA/kV was utilized to reduce the radiation dose to as low as reasonably achievable. COMPARISON: 12/29/2023 CLINICAL HISTORY: Pulmonary embolism (PE) suspected, high prob; chest pain, known cancer. Pulmonary embolism suspected, high probability; chest pain; known cancer. FINDINGS: PULMONARY ARTERIES: Pulmonary arteries are adequately opacified for evaluation. No pulmonary embolism. Main pulmonary artery is normal in caliber. MEDIASTINUM: The heart and pericardium demonstrate no acute abnormality. Aortic atherosclerosis. LYMPH NODES: No mediastinal, hilar or axillary lymphadenopathy. LUNGS AND PLEURA: Moderate left pleural effusion. Increasing left upper lobe airspace disease could reflect worsening pneumonia and/or tumor. This could also reflect post-treatment changes. Peripheral subpleural ground glass nodular opacity in the right lower lobe measures 8 mm compared to 9 mm previously. No pneumothorax. UPPER ABDOMEN: Stable low density lesions within the liver. SOFT TISSUES AND BONES: No acute bone or soft tissue abnormality. IMPRESSION: 1. No evidence of pulmonary embolus. 2. Increasing left upper lobe airspace disease, possibly reflecting worsening pneumonia and/or tumor. This also could reflect post-treatment changes. 3. Moderate left pleural effusion. Electronically signed by: Franky Crease MD 02/09/2024 02:04 AM EST RP Workstation: [id number]   DG Chest Port 1  View Result Date: 02/09/2024 EXAM: 1 VIEW(S) XRAY OF THE CHEST 02/09/2024 12:57:58 AM COMPARISON: 11/18/2023 CLINICAL HISTORY: chest pain, Chest pain. FINDINGS: LINES, TUBES AND DEVICES: Right port-a-cath is unchanged. LUNGS AND PLEURA: Small left pleural effusion with left lower lobe airspace disease, both increasing since prior study. No pneumothorax. HEART AND MEDIASTINUM: Cardiomegaly. BONES AND SOFT TISSUES: No acute osseous abnormality. IMPRESSION: 1. Small left pleural effusion with left lower lobe airspace disease. 2. Cardiomegaly. Electronically signed by: Franky Crease MD 02/09/2024 01:00 AM EST RP Workstation: [id number]   Cardiac monitor shows normal sinus rhythm, per my interpretation.  .Incision and Drainage  Date/Time: 02/09/2024 4:38 AM  Performed by: Raford Lenis, MD Authorized by: Raford Lenis, MD  Consent:    Consent obtained:  Verbal   Consent given by:  Patient   Risks, benefits, and alternatives were discussed: yes     Risks discussed:  Bleeding, pain and incomplete drainage   Alternatives discussed:  Alternative treatment Universal protocol:    Procedure explained and questions answered to patient or proxy's satisfaction: yes     Relevant documents present and verified: yes     Required blood products, implants, devices, and special equipment available: yes     Site/side marked: yes     Immediately prior to procedure, a time out was called: yes     Patient identity confirmed:  Verbally with patient and arm band Location:    Type:  Abscess   Size:  3 cm   Location:  Anogenital   Anogenital location:  Perianal Pre-procedure details:    Skin preparation:  Antiseptic wash Sedation:    Sedation type:  None Anesthesia:    Anesthesia method:  Local infiltration   Local anesthetic:  20 mL lidocaine  (XYLOCAINE ) 1 %-EPINEPHrine  1:200,000 (PF) injection Procedure type:    Complexity:  Complex Procedure details:    Ultrasound guidance: no     Needle aspiration: no      Incision types:  Cruciate   Incision depth:  Subcutaneous   Wound management:  Probed and deloculated   Drainage:  Purulent   Drainage amount:  Moderate   Wound treatment:  Wound left open   Packing materials:  None Post-procedure details:    Procedure completion:  Tolerated well, no immediate complications    Medications Ordered in the ED  lidocaine -EPINEPHrine  (XYLOCAINE  W/EPI) 2 %-1:200000 (PF) injection 20 mL (has no administration in time range)  cephALEXin  (KEFLEX ) capsule 500 mg (has no administration in time range)  potassium chloride  SA (KLOR-CON  M) CR tablet 40 mEq (has no administration in time range)  iohexol  (OMNIPAQUE ) 350 MG/ML injection 75 mL (50 mLs Intravenous Contrast Given 02/09/24 0149)                                    Medical Decision Making Risk Prescription drug management.   Chest pain of uncertain cause.  Perianal abscess.  I have reviewed his electrocardiogram, my interpretation is right bundle branch block which has progressed from prior nonspecific intraventricular.  Conduction delay but no ST or T changes.  Chest x-ray shows small left pleural effusion and left lower lobe airspace disease and cardiomegaly.  CT angiogram ordered at triage shows no pulmonary embolism, increasing left upper lobe airspace disease reflecting either pneumonia or tumor versus posttreatment changes, moderate left pleural effusion.  I have independently viewed all the images, and agree with the radiologist's interpretation.  I have reviewed his laboratory tests, my interpretation is stable renal insufficiency, mild hypokalemia, elevated random glucose level which will need to be followed as an outpatient, stable anemia, normal troponin x 2.  Nno evidence of ACS.   I have reviewed his past records and do note urgent care visit on 02/05/2024 with diagnosis of hemorrhoids.  I note oncology office visit on 01/16/2024 stating has a left upper lobe malignancy which is being treated with  immunotherapy, PET scan scheduled.  PET scan on 01/17/2024 showed decreased hypermetabolic Cavity and confluence of the left upper lobe mass, new 1.2 cm right adrenal mass suspicious for metastases..  I have recommended he use topical ice and heat, NSAIDs and acetaminophen  for pain and I  am referring him to cardiology for further outpatient workup.  Perianal abscesses treated with incision and drainage.     Final diagnoses:  Nonspecific chest pain  Perianal abscess  Hypokalemia  Renal insufficiency  Normochromic normocytic anemia  Elevated random blood glucose level    ED Discharge Orders          Ordered    cefdinir  (OMNICEF ) 300 MG capsule  2 times daily        02/09/24 0432    Ambulatory referral to Cardiology       Comments: If you have not heard from the Cardiology office within the next 72 hours please call [phone].   02/09/24 0434    potassium chloride  SA (KLOR-CON  M) 20 MEQ tablet  2 times daily        02/09/24 0434               Raford Lenis, MD 02/09/24 0440  "

## 2024-02-09 NOTE — ED Provider Triage Note (Signed)
 Emergency Medicine Provider Triage Evaluation Note  [name] , a 74 y.o. male  was evaluated in triage.  Pt complains of chest pain. Has known lung cancer, following with cancer center.  Newly found spot on his adrenal s/p bx of that as well.   Denies cough, fever, or URI symptoms  Review of Systems  Positive: Chest pain Negative: fever  Physical Exam  BP (!) 159/98   Pulse 84   Temp 98.9 F (37.2 C) (Oral)   Resp 20   SpO2 97%   Gen:   Awake, no distress   Resp:  Normal effort  MSK:   Moves extremities without difficulty  Other:    Medical Decision Making  Medically screening exam initiated at 12:14 AM.  Appropriate orders placed.  [name] was informed that the remainder of the evaluation will be completed by another provider, this initial triage assessment does not replace that evaluation, and the importance of remaining in the ED until their evaluation is complete.  Chest pain.  Known cancer.  Will get EKG, labs, CTA chest.   Christopher Olam HERO, PA-C 02/09/24 0016

## 2024-02-09 NOTE — ED Notes (Signed)
 Attempted to give report to [facility]. According to staff pt lives in independent section and therefore has no nurse to give report to. Attempted to reach assistant director of facility to let them know he was returning without success.

## 2024-02-11 ENCOUNTER — Other Ambulatory Visit: Payer: Self-pay | Admitting: Radiology

## 2024-02-11 DIAGNOSIS — R918 Other nonspecific abnormal finding of lung field: Secondary | ICD-10-CM

## 2024-02-13 ENCOUNTER — Ambulatory Visit: Admitting: Cardiology

## 2024-02-14 ENCOUNTER — Encounter: Payer: Self-pay | Admitting: Nurse Practitioner

## 2024-02-14 ENCOUNTER — Ambulatory Visit: Attending: Cardiology | Admitting: Nurse Practitioner

## 2024-02-14 ENCOUNTER — Other Ambulatory Visit: Payer: Self-pay

## 2024-02-14 VITALS — BP 128/66 | HR 93 | Ht 67.0 in | Wt 202.0 lb

## 2024-02-14 DIAGNOSIS — Z8673 Personal history of transient ischemic attack (TIA), and cerebral infarction without residual deficits: Secondary | ICD-10-CM | POA: Diagnosis not present

## 2024-02-14 DIAGNOSIS — R072 Precordial pain: Secondary | ICD-10-CM | POA: Diagnosis not present

## 2024-02-14 DIAGNOSIS — E785 Hyperlipidemia, unspecified: Secondary | ICD-10-CM | POA: Diagnosis not present

## 2024-02-14 DIAGNOSIS — I2510 Atherosclerotic heart disease of native coronary artery without angina pectoris: Secondary | ICD-10-CM | POA: Diagnosis not present

## 2024-02-14 DIAGNOSIS — N1831 Chronic kidney disease, stage 3a: Secondary | ICD-10-CM | POA: Insufficient documentation

## 2024-02-14 DIAGNOSIS — C3412 Malignant neoplasm of upper lobe, left bronchus or lung: Secondary | ICD-10-CM | POA: Insufficient documentation

## 2024-02-14 DIAGNOSIS — I5032 Chronic diastolic (congestive) heart failure: Secondary | ICD-10-CM | POA: Diagnosis not present

## 2024-02-14 DIAGNOSIS — I10 Essential (primary) hypertension: Secondary | ICD-10-CM | POA: Diagnosis not present

## 2024-02-14 NOTE — H&P (Incomplete)
 "      Chief Complaint: Patient was seen in consultation today for right adrenal gland at the request of [facility]  Referring Physician(s): Mohamed,Mohamed  Supervising Physician: Jennefer Rover  Patient Status: [facility] - Out-pt  History of Present Illness: [name] is a 74 y.o. male with PMHs of HTN, HF, pre-diabetes, MGUS, CKD, CVA, NSCL on chemoradiation and hypermetabolic right adrenal gland who presents for biopsy.   Patient is known to IR for port placement performed by Dr. Philip on 09/12/23.   Patient was diagnosed with NSCL in August 2025 and has been receiving chemoradiation. PET on 01/22/24 showed decreased hypermetabolic activity and confluence of the left upper lobe mass however also showed new hypermetabolic 1.2 cm right adrenal mass highly suspicious for metastasis. Biopsy of the right adrenal mass was recommended to the patient by his oncology team which he decided to proceed. Case was reviewed and approved by Dr. Karalee, patient presents for [facility] IR today for the biopsy.   ***  Past Medical History:  Diagnosis Date   (HFpEF) heart failure with preserved ejection fraction (HCC)    Allergy    Anxiety    Arthritis    Cancer (HCC)    Chronic kidney disease    Depression    Frequent urination    GERD (gastroesophageal reflux disease)    Gout    Headache    mirgaines in the past   Herpes    History of nuclear stress test    Myoview  05/2021: EF 77, normal perfusion, low risk   Hyperlipidemia    Hypertension    IBS (irritable bowel syndrome)    Memory loss    MGUS (monoclonal gammopathy of unknown significance)    Obesity    Pre-diabetes    Stroke [facility])    Urgency of urination     Past Surgical History:  Procedure Laterality Date   CHOLECYSTECTOMY N/A 12/17/2019   Procedure: LAPAROSCOPIC CHOLECYSTECTOMY WITH ICG INJECTION;  Surgeon: Ebbie Cough, MD;  Location: [facility] OR;  Service: General;  Laterality: N/A;   disckec     IR IMAGING GUIDED PORT  INSERTION  09/12/2023   shouler arthroscopy Left    SPINAL FUSION     TONSILLECTOMY     TOTAL KNEE ARTHROPLASTY Right 11/15/2021   Procedure: TOTAL KNEE ARTHROPLASTY;  Surgeon: Melodi Lerner, MD;  Location: [facility] ORS;  Service: Orthopedics;  Laterality: Right;   VIDEO BRONCHOSCOPY WITH ENDOBRONCHIAL NAVIGATION Left 08/21/2023   Procedure: VIDEO BRONCHOSCOPY WITH ENDOBRONCHIAL NAVIGATION;  Surgeon: Shelah Lamar RAMAN, MD;  Location: [facility] ENDOSCOPY;  Service: Pulmonary;  Laterality: Left;   VIDEO BRONCHOSCOPY WITH ENDOBRONCHIAL ULTRASOUND  08/21/2023   Procedure: BRONCHOSCOPY, WITH EBUS;  Surgeon: Shelah Lamar RAMAN, MD;  Location: [facility] ENDOSCOPY;  Service: Pulmonary;;   VIDEO BRONCHOSCOPY WITH RADIAL ENDOBRONCHIAL ULTRASOUND  08/21/2023   Procedure: VIDEO BRONCHOSCOPY WITH RADIAL ENDOBRONCHIAL ULTRASOUND;  Surgeon: Shelah Lamar RAMAN, MD;  Location: [facility] ENDOSCOPY;  Service: Pulmonary;;   WISDOM TOOTH EXTRACTION      Allergies: Divalproex sodium, Lamotrigine, Valproic acid, Amitriptyline, Aspirin , Hydrocodone , Lisinopril, Lithium, Morphine , Nsaids, Oxycodone, Oxycodone-acetaminophen , and Pork allergy  Medications: Prior to Admission medications  Medication Sig Start Date End Date Taking? Authorizing Provider  acetaminophen  (TYLENOL ) 500 MG tablet Take 1-2 tablets (500-1,000 mg total) by mouth every 6 (six) hours as needed for moderate pain. 11/18/21   Edmisten, Kristie L, PA  allopurinol  (ZYLOPRIM ) 100 MG tablet Take 150 mg by mouth daily. 09/14/22   [provider]  amLODipine  (NORVASC ) 10  MG tablet Take 1 tablet (10 mg total) by mouth daily. 07/26/23   Delford Maude BROCKS, MD  busPIRone  (BUSPAR ) 30 MG tablet Take 30 mg by mouth 2 (two) times daily.    [provider]  butalbital-acetaminophen -caffeine (FIORICET) 50-325-40 MG tablet Take 1 tablet by mouth every 6 (six) hours as needed for headache.    [provider]  cefdinir  (OMNICEF ) 300 MG capsule Take 1 capsule (300 mg total) by mouth 2  (two) times daily. 02/09/24   Raford Lenis, MD  cetirizine  (ZYRTEC  ALLERGY) 10 MG tablet Take 1 tablet (10 mg total) by mouth daily. 04/25/22   Christopher Savannah, PA-C  clopidogrel  (PLAVIX ) 75 MG tablet Take 1 tablet (75 mg total) by mouth daily. Okay to restart this medication on 08/22/2023. 08/21/23   Shelah Lamar RAMAN, MD  dextromethorphan  (DELSYM ) 30 MG/5ML liquid Take 5 mLs (30 mg total) by mouth 2 (two) times daily as needed for cough. 08/27/23   Randol Simmonds, MD  ferrous sulfate  (FEROSUL) 325 (65 FE) MG tablet TAKE 1 TABLET (325 MG TOTAL) BY MOUTH DAILY WITH BREAKFAST. 01/15/24   Heilingoetter, Cassandra L, PA-C  fluticasone  (FLONASE ) 50 MCG/ACT nasal spray Place 1 spray into both nostrils daily. 09/02/23   Jadine Toribio SQUIBB, MD  folic acid  (FOLVITE ) 1 MG tablet Take 1 mg by mouth daily. 11/10/21   [provider]  furosemide  (LASIX ) 40 MG tablet Take 1 tablet (40 mg total) by mouth daily. 07/13/23   Santo Stanly LABOR, MD  gabapentin  (NEURONTIN ) 300 MG capsule Take 2 capsules (600 mg total) by mouth 2 (two) times daily. 02/07/24   Pickenpack-Cousar, Athena N, NP  hydrALAZINE  (APRESOLINE ) 10 MG tablet Take 1 tablet (10 mg total) by mouth 2 (two) times daily. 11/20/23   Lelon Hamilton T, PA-C  HYDROcodone  bit-homatropine (HYCODAN) 5-1.5 MG/5ML syrup Take 5 mLs by mouth every 4 (four) hours as needed (Persistent cough). 09/01/23   Jadine Toribio SQUIBB, MD  hydrocortisone  (ANUSOL -HC) 2.5 % rectal cream Place 1 Application rectally 2 (two) times daily as needed for hemorrhoids. 02/05/24   Mayer, Jodi R, NP  hydrocortisone  (ANUSOL -HC) 25 MG suppository Place 1 suppository (25 mg total) rectally 2 (two) times daily as needed for hemorrhoids. 02/05/24   Mayer, Jodi R, NP  hydrocortisone  cream 1 % Apply 1 Application topically. 11/23/23   [provider]  Hydroxychloroquine  Sulfate 400 MG TABS Take 400 mg by mouth at bedtime.    [provider]  ketoconazole (NIZORAL) 2 % cream Apply 1 Application  topically daily. 07/31/23   [provider]  Lactobacillus (ACIDOPHILUS) CAPS capsule Take 1 capsule by mouth daily. 07/03/23   Vann, Jessica U, DO  lidocaine  (LIDODERM ) 5 % Place 1 patch onto the skin daily as needed (for pain- Remove & Discard patch within 12 hours or as directed by MD). 01/16/24   Pickenpack-Cousar, Athena N, NP  lidocaine  (XYLOCAINE ) 5 % ointment Apply 1 Application topically as needed. 11/18/23   Dasie Faden, MD  loperamide (IMODIUM) 2 MG capsule 2 mg as needed.    [provider]  methocarbamol  (ROBAXIN ) 500 MG tablet Take 1 tablet (500 mg total) by mouth 2 (two) times daily. 12/19/23   Wyatt Leeroy HERO, PA-C  Multiple Vitamin (MULTIVITAMIN) TABS TAKE 1 TABLET BY MOUTH DAILY. 11/15/23   Sherrod Sherrod, MD  PARoxetine  (PAXIL -CR) 37.5 MG 24 hr tablet Take 1 tablet (37.5 mg total) by mouth at bedtime. 08/21/23   Shelah Lamar RAMAN, MD  potassium chloride  SA (KLOR-CON  M)  20 MEQ tablet Take 1 tablet (20 mEq total) by mouth 2 (two) times daily. 02/09/24   Raford Lenis, MD  primidone  (MYSOLINE ) 250 MG tablet TAKE 1 TABLET (250 MG TOTAL) BY MOUTH 2 (TWO) TIMES DAILY. 12/06/23   Rosemarie Eather RAMAN, MD  QUEtiapine  (SEROQUEL ) 25 MG tablet Take 25 mg by mouth at bedtime.    [provider]  rosuvastatin  (CRESTOR ) 40 MG tablet Take 40 mg by mouth daily.    [provider]  traZODone  (DESYREL ) 100 MG tablet Take 100 mg by mouth at bedtime.    [provider]  valACYclovir  (VALTREX ) 1000 MG tablet Take 1,000 mg by mouth daily.    [provider]  Vitamin D , Ergocalciferol , (DRISDOL ) 1.25 MG (50000 UNIT) CAPS capsule Take 50,000 Units by mouth every Saturday. 09/17/20   [provider]  Wound Cleansers (RADIAPLEX EX) Apply topically as needed.    [provider]     Family History  Problem Relation Age of Onset   Hypertension Mother    Hypertension Father    Hypertension Sister    Hypertension Brother    Cancer Maternal Aunt     Cancer - Ovarian Maternal Grandmother    Diabetes Mellitus I Paternal Grandmother    Hypertension Paternal Grandfather    Cancer Maternal Aunt     Social History   Socioeconomic History   Marital status: Divorced    Spouse name: Not on file   Number of children: 1   Years of education: Masters   Highest education level: Not on file  Occupational History   Occupation: retired    Associate Professor: RETIRED  Tobacco Use   Smoking status: Former    Current packs/day: 0.00    Types: Cigarettes    Quit date: 1990    Years since quitting: 36.0   Smokeless tobacco: Never  Vaping Use   Vaping status: Never Used  Substance and Sexual Activity   Alcohol  use: No    Alcohol /week: 1.0 standard drink of alcohol     Types: 1 Cans of beer per week   Drug use: No   Sexual activity: Never  Other Topics Concern   Not on file  Social History Narrative   Patient lives in Independent living @ [facility]   Patient is right-handed.   Patient does not drink any caffeine.   Social Drivers of Health   Tobacco Use: Medium Risk (02/09/2024)   Patient History    Smoking Tobacco Use: Former    Smokeless Tobacco Use: Never    Passive Exposure: Not on file  Financial Resource Strain: Not on file  Food Insecurity: No Food Insecurity (09/07/2023)   Epic    Worried About Programme Researcher, Broadcasting/film/video in the Last Year: Never true    Ran Out of Food in the Last Year: Never true  Transportation Needs: No Transportation Needs (09/07/2023)   Epic    Lack of Transportation (Medical): No    Lack of Transportation (Non-Medical): No  Physical Activity: Not on file  Stress: Not on file  Social Connections: Moderately Isolated (08/28/2023)   Social Connection and Isolation Panel    Frequency of Communication with Friends and Family: More than three times a week    Frequency of Social Gatherings with Friends and Family: Twice a week    Attends Religious Services: Never    Database Administrator or Organizations: Yes     Attends Engineer, Structural: More than 4 times per year  Marital Status: Divorced  Depression (PHQ2-9): Low Risk (11/29/2023)   Depression (PHQ2-9)    PHQ-2 Score: 0  Alcohol  Screen: Not on file  Housing: Unknown (09/07/2023)   Epic    Unable to Pay for Housing in the Last Year: No    Number of Times Moved in the Last Year: Not on file    Homeless in the Last Year: No  Utilities: Not At Risk (09/07/2023)   Epic    Threatened with loss of utilities: No  Health Literacy: Not on file     Review of Systems: A 12 point ROS discussed and pertinent positives are indicated in the HPI above.  All other systems are negative.  Vital Signs: There were no vitals taken for this visit.  *** Physical Exam     Imaging: CT Angio Chest PE W and/or Wo Contrast Result Date: 02/09/2024 EXAM: CTA of the Chest without and with contrast for PE 02/09/2024 01:54:23 AM TECHNIQUE: CTA of the chest was performed without and with the administration of 50 mL of iohexol  (OMNIPAQUE ) 350 mg/mL intravenous contrast. Multiplanar reformatted images are provided for review. MIP images are provided for review. Automated exposure control, iterative reconstruction, and/or weight based adjustment of the mA/kV was utilized to reduce the radiation dose to as low as reasonably achievable. COMPARISON: 12/29/2023 CLINICAL HISTORY: Pulmonary embolism (PE) suspected, high prob; chest pain, known cancer. Pulmonary embolism suspected, high probability; chest pain; known cancer. FINDINGS: PULMONARY ARTERIES: Pulmonary arteries are adequately opacified for evaluation. No pulmonary embolism. Main pulmonary artery is normal in caliber. MEDIASTINUM: The heart and pericardium demonstrate no acute abnormality. Aortic atherosclerosis. LYMPH NODES: No mediastinal, hilar or axillary lymphadenopathy. LUNGS AND PLEURA: Moderate left pleural effusion. Increasing left upper lobe airspace disease could reflect worsening pneumonia and/or  tumor. This could also reflect post-treatment changes. Peripheral subpleural ground glass nodular opacity in the right lower lobe measures 8 mm compared to 9 mm previously. No pneumothorax. UPPER ABDOMEN: Stable low density lesions within the liver. SOFT TISSUES AND BONES: No acute bone or soft tissue abnormality. IMPRESSION: 1. No evidence of pulmonary embolus. 2. Increasing left upper lobe airspace disease, possibly reflecting worsening pneumonia and/or tumor. This also could reflect post-treatment changes. 3. Moderate left pleural effusion. Electronically signed by: Franky Crease MD 02/09/2024 02:04 AM EST RP Workstation: [id number]   DG Chest Port 1 View Result Date: 02/09/2024 EXAM: 1 VIEW(S) XRAY OF THE CHEST 02/09/2024 12:57:58 AM COMPARISON: 11/18/2023 CLINICAL HISTORY: chest pain, Chest pain. FINDINGS: LINES, TUBES AND DEVICES: Right port-a-cath is unchanged. LUNGS AND PLEURA: Small left pleural effusion with left lower lobe airspace disease, both increasing since prior study. No pneumothorax. HEART AND MEDIASTINUM: Cardiomegaly. BONES AND SOFT TISSUES: No acute osseous abnormality. IMPRESSION: 1. Small left pleural effusion with left lower lobe airspace disease. 2. Cardiomegaly. Electronically signed by: Franky Crease MD 02/09/2024 01:00 AM EST RP Workstation: [id number]   NM PET Image Restag (PS) Skull Base To Thigh Result Date: 01/22/2024 EXAM: PET AND CT SKULL BASE TO MID THIGH 01/17/2024 05:52:15 PM TECHNIQUE: RADIOPHARMACEUTICAL: 10.0 mCi F-18 FDG Glucose level 95 mg/dl. Blood pool SUV 1.9. Misregistration of PET and CT data in the head and neck region due to turning of the head by 65 degrees between the CT and PET acquisitions. PET imaging was acquired from the base of the skull to the mid thighs. Non-contrast enhanced computed tomography was obtained for attenuation correction and anatomic localization. COMPARISON: 07/26/2023 and PET CT from 12. CLINICAL HISTORY: Non-small cell lung cancer (NSCLC),  monitor. FINDINGS: HEAD AND NECK: No metabolically active cervical lymphadenopathy. CHEST: Reduced activity and confluence of the left upper lobe mass, current maximum SUV 6.7 and previous 22.5. Surrounding airspace opacity and volume loss in the left upper lobe, much of which may be therapy related. New moderate to large left pleural effusion. 8 mm right lower lobe ground glass density pulmonary nodule on image 33 series 7 without substantial hypermetabolic activity, although low grade adenocarcinoma is not completely excluded. PORT-A-CATH tip: Cavoatrial junction. Atherosclerotic thoracic aorta. No metabolically active lymphadenopathy. ABDOMEN AND PELVIS: New 1.2 cm right adrenal mass with maximum SUV 16.6, suspicion for metastatic lesion given the clinical context. Focally accentuated left posterior anal activity at about the 5 o'clock position with maximum SUV 10.3, formerly 11.6, cannot exclude neoplastic or inflammatory process. Small type 1 hiatal hernia. Hypodense and photopenic hepatic and renal lesions favoring cysts. No further imaging follow up of these lesions is indicated. Cholecystectomy. Systemic atherosclerosis is present, including the aorta and iliac arteries. No metabolically active lymphadenopathy. Physiologic activity within the gastrointestinal and genitourinary systems. BONES AND SOFT TISSUE: Lipoma of the left gluteus minimus muscle. No abnormal FDG activity localizes to the bones. No metabolically active aggressive osseous lesion. IMPRESSION: 1. Decreased hypermetabolic activity and confluence of the left upper lobe mass (maximum SUV 6.7, previously 22.5) with surrounding left upper lobe airspace opacity and volume loss, much of which may be therapy related. 2. New 1.2 cm right adrenal mass with maximum SUV 16.6, highly suspicious for metastasis. 3. New from 07/26/2023 and mildly increased from 12/29/23 moderate to large left pleural effusion. 4. Focally accentuated left posterior anal  activity at about the 5 o'clock position (maximum SUV 10.3, previously 11.6), with neoplastic or inflammatory process not excluded. 5. 8 mm right lower lobe ground-glass pulmonary nodule without substantial hypermetabolic activity, with low-grade adenocarcinoma not excluded. 6. Additional chronic and incidental findings include port catheter tip at the cavoatrial junction, systemic atherosclerosis, small type 1 hiatal hernia, cholecystectomy, and a left gluteus minimus intramuscular lipoma. Electronically signed by: Ryan Salvage MD 01/22/2024 09:47 AM EST RP Workstation: [id number]    Labs:  CBC: Recent Labs    11/29/23 1150 12/26/23 1113 01/23/24 1149 02/09/24 0053  WBC 6.4 4.7 5.6 7.1  HGB 10.8* 11.1* 12.0* 11.2*  HCT 31.4* 32.8* 35.4* 33.6*  PLT 366 285 307 238    COAGS: Recent Labs    08/27/23 1316 08/27/23 2357  INR 1.0 1.1    BMP: Recent Labs    11/29/23 1150 12/26/23 1113 01/23/24 1149 02/09/24 0053  NA 137 139 137 137  K 3.9 3.5 3.7 3.4*  CL 104 104 102 100  CO2 27 25 27 26   GLUCOSE 88 104* 85 109*  BUN 14 11 14 11   CALCIUM  8.9 9.1 9.3 8.4*  CREATININE 1.32* 1.26* 1.31* 1.27*  GFRNONAA 58* >60 58* >60    LIVER FUNCTION TESTS: Recent Labs    11/29/23 1150 12/26/23 1113 01/23/24 1149 02/09/24 0053  BILITOT 0.2 <0.2 0.4 <0.2  AST 14* 18 21 17   ALT 9 7 9 5   ALKPHOS 72 88 79 71  PROT 7.4 7.5 7.7 6.9  ALBUMIN 3.9 4.0 4.3 3.6    TUMOR MARKERS: No results for input(s): AFPTM, CEA, CA199, CHROMGRNA in the last 8760 hours.  Assessment and Plan: 74 y.o. male with lung CA and hypermetabolic right adrenal mass who presents for biopsy.   NPO since *** VSS CBC *** INR *** AC/AP: Plavix , has been held for *** days  Allergies reviewed   Risks and benefits of adrenal mass biopsy was discussed with the patient and/or patient's family including, but not limited to bleeding, infection, damage to adjacent structures or low yield requiring  additional tests.  All of the questions were answered and there is agreement to proceed.  Consent signed and in chart.   Thank you for this interesting consult.  I greatly enjoyed meeting Kodee L Bontempo and look forward to participating in their care.  A copy of this report was sent to the requesting provider on this date.  Electronically Signed: Toya VEAR Cousin, PA-C 02/14/2024, 8:10 AM   I spent a total of  30 Minutes   in face to face in clinical consultation, greater than 50% of which was counseling/coordinating care for right adrenal mass biopsy.   This chart was dictated using voice recognition software.  Despite best efforts to proofread,  errors can occur which can change the documentation meaning.   "

## 2024-02-14 NOTE — Patient Instructions (Signed)
 Medication Instructions:  Your physician recommends that you continue on your current medications as directed. Please refer to the Current Medication list given to you today.  *If you need a refill on your cardiac medications before your next appointment, please call your pharmacy*  Lab Work: NONE ordered at this time of appointment   Testing/Procedures: NONE ordered at this time of appointment    Follow-Up: At [facility], you and your health needs are our priority.  As part of our continuing mission to provide you with exceptional heart care, our providers are all part of one team.  This team includes your primary Cardiologist (physician) and Advanced Practice Providers or APPs (Physician Assistants and Nurse Practitioners) who all work together to provide you with the care you need, when you need it.  Your next appointment:   3--4 month(s)  Provider:   Stanly DELENA Leavens, MD or Damien Braver, NP or Glendia Ferrier, PA-C          We recommend signing up for the patient portal called MyChart.  Sign up information is provided on this After Visit Summary.  MyChart is used to connect with patients for Virtual Visits (Telemedicine).  Patients are able to view lab/test results, encounter notes, upcoming appointments, etc.  Non-urgent messages can be sent to your provider as well.   To learn more about what you can do with MyChart, go to [email or web address].   Other Instructions Monitor BP.  Take BP 2 hours after you take BP medications. Report BP consistently greater than 140/90.

## 2024-02-14 NOTE — Progress Notes (Addendum)
 "  Office Visit    Patient Name: [name] Date of Encounter: 02/14/2024  Primary Care Provider:  [name], MD Primary Cardiologist:  Stanly DELENA Leavens, MD  Chief Complaint    74 year old male with a history of coronary artery calcification noted on prior CT,  HFpEF, first-degree AV block, hypertension, hyperlipidemia, CVA, non-small cell lung cancer, MGUS, COPD, CKD, gout, ED, GERD, and depression who presents for hospital follow-up related to chest pain.  Past Medical History    Past Medical History:  Diagnosis Date   (HFpEF) heart failure with preserved ejection fraction (HCC)    Allergy    Anxiety    Arthritis    Cancer (HCC)    Chronic kidney disease    Depression    Frequent urination    GERD (gastroesophageal reflux disease)    Gout    Headache    mirgaines in the past   Herpes    History of nuclear stress test    Myoview  05/2021: EF 77, normal perfusion, low risk   Hyperlipidemia    Hypertension    IBS (irritable bowel syndrome)    Memory loss    MGUS (monoclonal gammopathy of unknown significance)    Obesity    Pre-diabetes    Stroke [facility])    Urgency of urination    Past Surgical History:  Procedure Laterality Date   CHOLECYSTECTOMY N/A 12/17/2019   Procedure: LAPAROSCOPIC CHOLECYSTECTOMY WITH ICG INJECTION;  Surgeon: Ebbie Cough, MD;  Location: [facility] OR;  Service: General;  Laterality: N/A;   disckec     IR IMAGING GUIDED PORT INSERTION  09/12/2023   shouler arthroscopy Left    SPINAL FUSION     TONSILLECTOMY     TOTAL KNEE ARTHROPLASTY Right 11/15/2021   Procedure: TOTAL KNEE ARTHROPLASTY;  Surgeon: Melodi Lerner, MD;  Location: [facility] ORS;  Service: Orthopedics;  Laterality: Right;   VIDEO BRONCHOSCOPY WITH ENDOBRONCHIAL NAVIGATION Left 08/21/2023   Procedure: VIDEO BRONCHOSCOPY WITH ENDOBRONCHIAL NAVIGATION;  Surgeon: Shelah Lamar RAMAN, MD;  Location: [facility] ENDOSCOPY;  Service: Pulmonary;  Laterality: Left;   VIDEO BRONCHOSCOPY WITH  ENDOBRONCHIAL ULTRASOUND  08/21/2023   Procedure: BRONCHOSCOPY, WITH EBUS;  Surgeon: Shelah Lamar RAMAN, MD;  Location: [facility] ENDOSCOPY;  Service: Pulmonary;;   VIDEO BRONCHOSCOPY WITH RADIAL ENDOBRONCHIAL ULTRASOUND  08/21/2023   Procedure: VIDEO BRONCHOSCOPY WITH RADIAL ENDOBRONCHIAL ULTRASOUND;  Surgeon: Shelah Lamar RAMAN, MD;  Location: [facility] ENDOSCOPY;  Service: Pulmonary;;   WISDOM TOOTH EXTRACTION      Allergies  Allergies[1]   Labs/Other Studies Reviewed    The following studies were reviewed today:  Cardiac Studies & Procedures   ______________________________________________________________________________________________   STRESS TESTS  MYOCARDIAL PERFUSION IMAGING 05/20/2021  Interpretation Summary   The study is normal. The study is low risk.   No ST deviation was noted.   LV perfusion is normal.   Left ventricular function is normal. Nuclear stress EF: 77 %. The left ventricular ejection fraction is hyperdynamic (>65%). End diastolic cavity size is normal. End systolic cavity size is normal.   Prior study not available for comparison.  Normal perfusion. LVEF 77% with normal wall motion. This is a low risk study. No prior for comparison.   ECHOCARDIOGRAM  ECHOCARDIOGRAM COMPLETE 07/01/2023  Narrative ECHOCARDIOGRAM REPORT    Patient Name:   [name] Date of Exam: 07/01/2023 Medical Rec #:  [id number]       Height:       67.0 in Accession #:    [id number]  Weight:       199.8 lb Date of Birth:  04-20-1950       BSA:          2.021 m Patient Age:    4 years        BP:           114/67 mmHg Patient Gender: M               HR:           58 bpm. Exam Location:  Inpatient  Procedure: 2D Echo, 3D Echo, Color Doppler, Cardiac Doppler and Strain Analysis (Both Spectral and Color Flow Doppler were utilized during procedure).  Indications:    Syncope  History:        Patient has prior history of Echocardiogram examinations, most recent 12/15/2019. CHF, Stroke; Risk  Factors:Hypertension, Dyslipidemia, Former Smoker and CKD. GERD.  Sonographer:    Logan Shove RDCS Referring Phys: [id number] SARA-MAIZ A THOMAS  IMPRESSIONS   1. Left ventricular ejection fraction, by estimation, is 65%. Left ventricular ejection fraction by 3D volume is 66 %. The left ventricle has normal function. The left ventricle has no regional wall motion abnormalities. There is mild left ventricular hypertrophy of the inferior and septal segments. Left ventricular diastolic parameters are consistent with Grade II diastolic dysfunction (pseudonormalization). The average left ventricular global longitudinal strain is -22.0 %. The global longitudinal strain is normal. 2. Right ventricular systolic function is normal. The right ventricular size is mildly enlarged. Mildly increased right ventricular wall thickness. 3. Left atrial size was severely dilated. 4. The mitral valve is normal in structure. No evidence of mitral valve regurgitation. No evidence of mitral stenosis. 5. The aortic valve is tricuspid. There is mild calcification of the aortic valve. Aortic valve regurgitation is not visualized. Aortic valve sclerosis is present, with no evidence of aortic valve stenosis. 6. The inferior vena cava is normal in size with greater than 50% respiratory variability, suggesting right atrial pressure of 3 mmHg.  Comparison(s): Prior images reviewed side by side. Left ventricular function is slightly less vigorous.  FINDINGS Left Ventricle: Left ventricular ejection fraction, by estimation, is 65%. Left ventricular ejection fraction by 3D volume is 66 %. The left ventricle has normal function. The left ventricle has no regional wall motion abnormalities. The average left ventricular global longitudinal strain is -22.0 %. Strain was performed and the global longitudinal strain is normal. The left ventricular internal cavity size was normal in size. There is mild left ventricular hypertrophy of the  inferior and septal segments. Left ventricular diastolic parameters are consistent with Grade II diastolic dysfunction (pseudonormalization).  Right Ventricle: The right ventricular size is mildly enlarged. Mildly increased right ventricular wall thickness. Right ventricular systolic function is normal.  Left Atrium: Left atrial size was severely dilated.  Right Atrium: Right atrial size was normal in size.  Pericardium: There is no evidence of pericardial effusion.  Mitral Valve: The mitral valve is normal in structure. No evidence of mitral valve regurgitation. No evidence of mitral valve stenosis.  Tricuspid Valve: The tricuspid valve is normal in structure. Tricuspid valve regurgitation is not demonstrated. No evidence of tricuspid stenosis.  Aortic Valve: The aortic valve is tricuspid. There is mild calcification of the aortic valve. Aortic valve regurgitation is not visualized. Aortic valve sclerosis is present, with no evidence of aortic valve stenosis. Aortic valve mean gradient measures 4.0 mmHg. Aortic valve peak gradient measures 6.6 mmHg. Aortic valve area, by VTI measures 2.98 cm.  Pulmonic Valve: The pulmonic valve was normal in structure. Pulmonic valve regurgitation is not visualized. No evidence of pulmonic stenosis.  Aorta: The aortic root and ascending aorta are structurally normal, with no evidence of dilitation.  Venous: The inferior vena cava is normal in size with greater than 50% respiratory variability, suggesting right atrial pressure of 3 mmHg.  IAS/Shunts: The atrial septum is grossly normal.  Additional Comments: 3D was performed not requiring image post processing on an independent workstation and was normal.   LEFT VENTRICLE PLAX 2D LVIDd:         4.00 cm         Diastology LVIDs:         2.60 cm         LV e' medial:    5.77 cm/s LV PW:         0.80 cm         LV E/e' medial:  16.9 LV IVS:        1.40 cm         LV e' lateral:   6.85 cm/s LVOT diam:      2.00 cm         LV E/e' lateral: 14.2 LV SV:         90 LV SV Index:   44              2D Longitudinal LVOT Area:     3.14 cm        Strain 2D Strain GLS   -22.0 % Avg:  3D Volume EF LV 3D EF:    Left ventricul ar ejection fraction by 3D volume is 66 %.  3D Volume EF: 3D EF:        66 % LV EDV:       115 ml LV ESV:       39 ml LV SV:        76 ml  RIGHT VENTRICLE             IVC RV Basal diam:  4.20 cm     IVC diam: 1.30 cm RV Mid diam:    2.40 cm RV S prime:     11.70 cm/s TAPSE (M-mode): 2.7 cm  LEFT ATRIUM              Index        RIGHT ATRIUM           Index LA diam:        4.60 cm  2.28 cm/m   RA Area:     17.80 cm LA Vol (A2C):   97.4 ml  48.18 ml/m  RA Volume:   54.00 ml  26.71 ml/m LA Vol (A4C):   105.0 ml 51.94 ml/m LA Biplane Vol: 102.0 ml 50.46 ml/m AORTIC VALVE AV Area (Vmax):    3.02 cm AV Area (Vmean):   2.89 cm AV Area (VTI):     2.98 cm AV Vmax:           128.00 cm/s AV Vmean:          87.500 cm/s AV VTI:            0.300 m AV Peak Grad:      6.6 mmHg AV Mean Grad:      4.0 mmHg LVOT Vmax:         123.00 cm/s LVOT Vmean:        80.600 cm/s LVOT VTI:  0.285 m LVOT/AV VTI ratio: 0.95  AORTA Ao Root diam: 3.10 cm Ao Asc diam:  3.40 cm  MITRAL VALVE               TRICUSPID VALVE MV Area (PHT): 3.48 cm    TR Peak grad:   30.9 mmHg MV Decel Time: 218 msec    TR Vmax:        278.00 cm/s MV E velocity: 97.40 cm/s MV A velocity: 98.10 cm/s  SHUNTS MV E/A ratio:  0.99        Systemic VTI:  0.29 m Systemic Diam: 2.00 cm  Stanly Leavens MD Electronically signed by Stanly Leavens MD Signature Date/Time: 07/01/2023/1:11:08 PM    Final          ______________________________________________________________________________________________     Recent Labs: 08/28/2023: Magnesium  2.2 11/21/2023: B Natriuretic Peptide 4.7 01/23/2024: TSH 1.820 02/09/2024: ALT 5; BUN 11; Creatinine, Ser 1.27; Hemoglobin 11.2;  Platelets 238; Potassium 3.4; Sodium 137  Recent Lipid Panel    Component Value Date/Time   CHOL 140 06/04/2020 1320   TRIG 106 06/04/2020 1320   HDL 35 (L) 06/04/2020 1320   CHOLHDL 4.0 06/04/2020 1320   CHOLHDL 5.7 12/17/2019 0241   VLDL 36 12/17/2019 0241   LDLCALC 85 06/04/2020 1320    History of Present Illness    74 year old male with the above past medical history including coronary artery calcification noted on prior CT, HFpEF, first-degree AV block, hypertension, hyperlipidemia, CVA, non-small cell lung cancer, MGUS, COPD, CKD, gout, ED, GERD, and depression.  He has a history of chronic HFpEF, followed by Dr. Arnetha.  Lexiscan  Myoview  in 05/2021 was low risk, no evidence of ischemia.  Most recent echocardiogram in 06/2023 showed EF 65%, normal LV function, no RWMA, mild LVH, G2 DD, normal RV systolic function, mild RVE, severe LAE, aortic valve sclerosis without evidence of aortic stenosis.  He was last seen in the office on 11/20/2023 and was stable from a cardiac standpoint.  He reported worsening shortness of breath, fatigue, lightheadedness.  BP was elevated.  He was started on hydralazine  10 mg twice daily.  He was seen in the ED on 02/09/2024 in the setting of chest pain.  EKG showed RBBB, no acute changes, troponin was negative x 2, x-ray showed small left pleural effusion, cardiomegaly.  CT angio of the chest was negative for PE, but did show moderate left pleural effusion. He was advised to follow-up with cardiology as an outpatient.  He presents today for follow-up.  Since his last visit and since his recent ED visit he has been stable from a cardiac standpoint.  He does not think that his chest pain is cardiac in nature.  He shares that since the time of his radiation, he has experienced intermittent pain under his left scapula that radiates to his left side and occasionally underneath his left breast. The pain that he experienced prior to his ED visit was similar to his  previous pain. He denies any exertional symptoms concerning for angina.  He has stable nonpitting bilateral lower extremity edema, he denies any palpitations, dizziness, dyspnea, PND, orthopnea, weight gain. His Plavix  is currently being held as he is pending adrenal biopsy on 02/15/2024. Overall, he reports feeling well.   Home Medications    Current Outpatient Medications  Medication Sig Dispense Refill   acetaminophen  (TYLENOL ) 500 MG tablet Take 1-2 tablets (500-1,000 mg total) by mouth every 6 (six) hours as needed for moderate pain. 30 tablet 0  allopurinol  (ZYLOPRIM ) 100 MG tablet Take 150 mg by mouth daily.     amLODipine  (NORVASC ) 10 MG tablet Take 1 tablet (10 mg total) by mouth daily. 90 tablet 3   busPIRone  (BUSPAR ) 30 MG tablet Take 30 mg by mouth 2 (two) times daily.     butalbital-acetaminophen -caffeine (FIORICET) 50-325-40 MG tablet Take 1 tablet by mouth every 6 (six) hours as needed for headache.     cefdinir  (OMNICEF ) 300 MG capsule Take 1 capsule (300 mg total) by mouth 2 (two) times daily. 14 capsule 0   cetirizine  (ZYRTEC  ALLERGY) 10 MG tablet Take 1 tablet (10 mg total) by mouth daily. 30 tablet 0   dextromethorphan  (DELSYM ) 30 MG/5ML liquid Take 5 mLs (30 mg total) by mouth 2 (two) times daily as needed for cough. 89 mL 0   ferrous sulfate  (FEROSUL) 325 (65 FE) MG tablet TAKE 1 TABLET (325 MG TOTAL) BY MOUTH DAILY WITH BREAKFAST. 28 tablet 2   fluticasone  (FLONASE ) 50 MCG/ACT nasal spray Place 1 spray into both nostrils daily. 16 g 2   folic acid  (FOLVITE ) 1 MG tablet Take 1 mg by mouth daily.     furosemide  (LASIX ) 40 MG tablet Take 1 tablet (40 mg total) by mouth daily. 90 tablet 3   gabapentin  (NEURONTIN ) 300 MG capsule Take 2 capsules (600 mg total) by mouth 2 (two) times daily. 120 capsule 0   hydrALAZINE  (APRESOLINE ) 10 MG tablet Take 1 tablet (10 mg total) by mouth 2 (two) times daily. 180 tablet 3   HYDROcodone  bit-homatropine (HYCODAN) 5-1.5 MG/5ML syrup Take 5  mLs by mouth every 4 (four) hours as needed (Persistent cough). 120 mL 0   hydrocortisone  (ANUSOL -HC) 2.5 % rectal cream Place 1 Application rectally 2 (two) times daily as needed for hemorrhoids. 30 g 0   hydrocortisone  (ANUSOL -HC) 25 MG suppository Place 1 suppository (25 mg total) rectally 2 (two) times daily as needed for hemorrhoids. 12 suppository 0   hydrocortisone  cream 1 % Apply 1 Application topically.     Hydroxychloroquine  Sulfate 400 MG TABS Take 400 mg by mouth at bedtime.     ketoconazole (NIZORAL) 2 % cream Apply 1 Application topically daily.     Lactobacillus (ACIDOPHILUS) CAPS capsule Take 1 capsule by mouth daily. 100 capsule 0   lidocaine  (LIDODERM ) 5 % Place 1 patch onto the skin daily as needed (for pain- Remove & Discard patch within 12 hours or as directed by MD). 30 patch 3   lidocaine  (XYLOCAINE ) 5 % ointment Apply 1 Application topically as needed. 35.44 g 0   loperamide (IMODIUM) 2 MG capsule 2 mg as needed.     methocarbamol  (ROBAXIN ) 500 MG tablet Take 1 tablet (500 mg total) by mouth 2 (two) times daily. 20 tablet 0   Multiple Vitamin (MULTIVITAMIN) TABS TAKE 1 TABLET BY MOUTH DAILY. 27 tablet 1   PARoxetine  (PAXIL -CR) 37.5 MG 24 hr tablet Take 1 tablet (37.5 mg total) by mouth at bedtime.     potassium chloride  SA (KLOR-CON  M) 20 MEQ tablet Take 1 tablet (20 mEq total) by mouth 2 (two) times daily. 10 tablet 0   primidone  (MYSOLINE ) 250 MG tablet TAKE 1 TABLET (250 MG TOTAL) BY MOUTH 2 (TWO) TIMES DAILY. 60 tablet 3   QUEtiapine  (SEROQUEL ) 25 MG tablet Take 25 mg by mouth at bedtime.     rosuvastatin  (CRESTOR ) 40 MG tablet Take 40 mg by mouth daily.     traZODone  (DESYREL ) 100 MG tablet Take 100 mg by mouth  at bedtime.     valACYclovir  (VALTREX ) 1000 MG tablet Take 1,000 mg by mouth daily.     Vitamin D , Ergocalciferol , (DRISDOL ) 1.25 MG (50000 UNIT) CAPS capsule Take 50,000 Units by mouth every Saturday.     Wound Cleansers (RADIAPLEX EX) Apply topically as  needed.     clopidogrel  (PLAVIX ) 75 MG tablet Take 1 tablet (75 mg total) by mouth daily. Okay to restart this medication on 08/22/2023. (Patient not taking: Reported on 02/14/2024)     No current facility-administered medications for this visit.     Review of Systems    He denies chest pain, palpitations, dyspnea, pnd, orthopnea, n, v, dizziness, syncope, edema, weight gain, or early satiety. All other systems reviewed and are otherwise negative except as noted above.   Physical Exam    VS:  BP 128/66 (BP Location: Left Arm, Patient Position: Sitting, Cuff Size: Normal)   Pulse 93   Ht 5' 7 (1.702 m)   Wt 202 lb (91.6 kg)   SpO2 96%   BMI 31.64 kg/m  GEN: Well nourished, well developed, in no acute distress. HEENT: normal. Neck: Supple, no JVD, carotid bruits, or masses. Cardiac: RRR, no murmurs, rubs, or gallops. No clubbing, cyanosis, nonpitting bilateral lower extremity edema.  Radials/DP/PT 2+ and equal bilaterally.  Respiratory:  Respirations regular and unlabored, clear to auscultation bilaterally. GI: Soft, nontender, nondistended, BS + x 4. MS: no deformity or atrophy. Skin: warm and dry, no rash. Neuro:  Strength and sensation are intact. Psych: Normal affect.  Accessory Clinical Findings    ECG personally reviewed by me today -    - no EKG in office today.    Lab Results  Component Value Date   WBC 7.1 02/09/2024   HGB 11.2 (L) 02/09/2024   HCT 33.6 (L) 02/09/2024   MCV 94.1 02/09/2024   PLT 238 02/09/2024   Lab Results  Component Value Date   CREATININE 1.27 (H) 02/09/2024   BUN 11 02/09/2024   NA 137 02/09/2024   K 3.4 (L) 02/09/2024   CL 100 02/09/2024   CO2 26 02/09/2024   Lab Results  Component Value Date   ALT 5 02/09/2024   AST 17 02/09/2024   ALKPHOS 71 02/09/2024   BILITOT <0.2 02/09/2024   Lab Results  Component Value Date   CHOL 140 06/04/2020   HDL 35 (L) 06/04/2020   LDLCALC 85 06/04/2020   TRIG 106 06/04/2020   CHOLHDL 4.0  06/04/2020    Lab Results  Component Value Date   HGBA1C 5.2 07/01/2023    Assessment & Plan    1. Chest pain/coronary artery calcification: Lexiscan  Myoview  in 05/2021 was low risk, no evidence of ischemia. He does have evidence of coronary artery calcification noted on prior CT scan. Recent ED visit on 02/09/2024 in the setting of chest pain.  Workup was unremarkable.  He does not think that his chest pain is cardiac in nature.  Since his radiation treatment, he has experienced intermittent pain under his left scapula that radiates to his left side indicating underneath his left breast. This is the same pain that he experienced prior to his ED visit.  We discussed possible ischemic evaluation, he declines any additional testing at this time.  Continue to monitor for progressive symptoms.  Continue amlodipine , Crestor .  2. HFpEF: Most recent echocardiogram in 06/2023 showed EF 65%, normal LV function, no RWMA, mild LVH, G2 DD, normal RV systolic function, mild RVE, severe LAE, aortic valve sclerosis without evidence  of aortic stenosis.  Recent CT of the chest showed moderate left pleural effusion.  Generally euvolemic on exam.  He denies any worsening dyspnea, he has stable nonpitting bilateral lower extremity edema.  Generally euvolemic on exam.  We discussed possible titration of Lasix , he declines.  Monitor for progressive symptoms.  Continue Lasix .  3. First-degree AV block/RBBB: Stable on most recent EKG.  4. Hypertension: BP well controlled in office today.  His average BP prior to taking his medications has been 130s/90s mmHg. Encouraged him to check his BP 2 hours after medications and report BP consistently greater than 140/90 mmHg.  For now, continue current antihypertensive regimen.  5. Hyperlipidemia: No recent LDL on file.  LDL goal less than 70.  Monitored and managed per PCP.  Continue Crestor .  6. History of CVA: No residual.  Continue Plavix , Crestor .  7. Lung cancer: S/p chemo,  radiation.  New concern for metastatic process.  Pending adrenal gland biopsy on 02/15/2024.  Followed by pulmonology, oncology.  8. CKD stage IIIa: Creatinine was stable at 1.27 in 01/2024.  9. Disposition: Follow-up in 3 to 4 months, sooner if needed.       Damien JAYSON Braver, NP 02/14/2024, 10:54 AM       [1]  Allergies Allergen Reactions   Divalproex Sodium Hives   Lamotrigine Diarrhea and Other (See Comments)    Shakes and unsteadiness     Valproic Acid Hives and Other (See Comments)    Shakes GI problems, too   Amitriptyline Other (See Comments)    Reaction not known   Aspirin  Nausea And Vomiting and Other (See Comments)    Dizziness, weakness and balance issues, and sweats too   Hydrocodone  Nausea And Vomiting   Lisinopril Cough         Lithium Diarrhea and Other (See Comments)    Falls over and shakes    Morphine  Other (See Comments)     'not tolerated well   Nsaids Other (See Comments)    Kidney disease   Oxycodone Nausea And Vomiting   Oxycodone-Acetaminophen  Nausea And Vomiting and Other (See Comments)    Also causes dizziness   Pork Allergy Nausea Only   "

## 2024-02-15 ENCOUNTER — Ambulatory Visit (HOSPITAL_COMMUNITY)
Admission: RE | Admit: 2024-02-15 | Discharge: 2024-02-15 | Disposition: A | Discharge status: Home / Self Care | Source: Ambulatory Visit | Attending: Internal Medicine | Admitting: Internal Medicine

## 2024-02-16 ENCOUNTER — Ambulatory Visit (HOSPITAL_COMMUNITY)

## 2024-02-21 ENCOUNTER — Inpatient Hospital Stay: Attending: Internal Medicine

## 2024-02-21 ENCOUNTER — Inpatient Hospital Stay

## 2024-02-21 ENCOUNTER — Inpatient Hospital Stay: Attending: Internal Medicine | Admitting: Internal Medicine

## 2024-02-21 VITALS — BP 113/65 | HR 84 | Temp 98.0°F | Resp 16

## 2024-02-21 VITALS — BP 114/71 | HR 85 | Temp 97.9°F | Resp 17 | Ht 67.0 in | Wt 202.2 lb

## 2024-02-21 DIAGNOSIS — C3412 Malignant neoplasm of upper lobe, left bronchus or lung: Secondary | ICD-10-CM | POA: Diagnosis not present

## 2024-02-21 LAB — CMP (CANCER CENTER ONLY)
ALT: 10 U/L (ref 0–44)
AST: 19 U/L (ref 15–41)
Albumin: 4.2 g/dL (ref 3.5–5.0)
Alkaline Phosphatase: 81 U/L (ref 38–126)
Anion gap: 10 (ref 5–15)
BUN: 12 mg/dL (ref 8–23)
CO2: 25 mmol/L (ref 22–32)
Calcium: 9.1 mg/dL (ref 8.9–10.3)
Chloride: 103 mmol/L (ref 98–111)
Creatinine: 1.44 mg/dL — ABNORMAL HIGH (ref 0.61–1.24)
GFR, Estimated: 52 mL/min — ABNORMAL LOW
Glucose, Bld: 94 mg/dL (ref 70–99)
Potassium: 3.9 mmol/L (ref 3.5–5.1)
Sodium: 139 mmol/L (ref 135–145)
Total Bilirubin: <0.2 mg/dL (ref 0.0–1.2)
Total Protein: 7.7 g/dL (ref 6.5–8.1)

## 2024-02-21 LAB — CBC WITH DIFFERENTIAL (CANCER CENTER ONLY)
Abs Immature Granulocytes: 0.01 10*3/uL (ref 0.00–0.07)
Basophils Absolute: 0.1 10*3/uL (ref 0.0–0.1)
Basophils Relative: 1 %
Eosinophils Absolute: 0.2 10*3/uL (ref 0.0–0.5)
Eosinophils Relative: 4 %
HCT: 36.8 % — ABNORMAL LOW (ref 39.0–52.0)
Hemoglobin: 12.3 g/dL — ABNORMAL LOW (ref 13.0–17.0)
Immature Granulocytes: 0 %
Lymphocytes Relative: 19 %
Lymphs Abs: 1.2 10*3/uL (ref 0.7–4.0)
MCH: 30.4 pg (ref 26.0–34.0)
MCHC: 33.4 g/dL (ref 30.0–36.0)
MCV: 90.9 fL (ref 80.0–100.0)
Monocytes Absolute: 0.6 10*3/uL (ref 0.1–1.0)
Monocytes Relative: 9 %
Neutro Abs: 4.2 10*3/uL (ref 1.7–7.7)
Neutrophils Relative %: 67 %
Platelet Count: 315 10*3/uL (ref 150–400)
RBC: 4.05 MIL/uL — ABNORMAL LOW (ref 4.22–5.81)
RDW: 13.4 % (ref 11.5–15.5)
WBC Count: 6.3 10*3/uL (ref 4.0–10.5)
nRBC: 0 % (ref 0.0–0.2)

## 2024-02-21 MED ORDER — SODIUM CHLORIDE 0.9 % IV SOLN: INTRAVENOUS | Status: DC

## 2024-02-21 MED ORDER — SODIUM CHLORIDE 0.9 % IV SOLN
1500.0000 mg | Freq: Once | INTRAVENOUS | Status: AC
  Administered 2024-02-21: 1500 mg via INTRAVENOUS
  Filled 2024-02-21: qty 30

## 2024-02-21 MED ORDER — SODIUM CHLORIDE 0.9% FLUSH: 10.0000 mL | INTRAVENOUS | Status: DC | PRN

## 2024-02-21 NOTE — Patient Instructions (Signed)

## 2024-02-21 NOTE — Progress Notes (Signed)
 "     [facility] [phone]   [phone]  OFFICE PROGRESS NOTE  Christopher Francisco, MD [address] [place] [address]  DIAGNOSIS: Metastatic non-small cell lung cancer initially diagnosed as stage IIIA (T3, N1, M0) non-small cell lung cancer, adenocarcinoma presented with large left upper lobe lung mass in addition to left hilar lymphadenopathy diagnosed in August 2025.    Molecular Studies:  Biomarker Findings Tumor Mutational Burden - 13 Muts/Mb HRD signature - HRDsig Negative Microsatellite status - MS-Equivocal ? Genomic Findings For a complete list of the genes assayed, please refer to the Appendix. MET exon 14 splice site (3028+1G>[id number]), amplification CDK4 amplification MDM2 amplification PIK3CA E453K, E726K   PDL1: 99%   PRIOR THERAPY:  1) Concurrent chemoradiation with carboplatin  for an AUC of 2 and taxol  45 mg/m2. First dose on 09/19/23. Status post 5 cycles.  Last dose was given on 10/16/2023. 2) palliative radiotherapy to right adrenal gland metastasis under the care of Dr. Izell.CURRENT THERAPY:  1) consolidation immunotherapy with durvalumab  1500 mg IV every 4 weeks.  Status post 3 cycle. 2) Fereheme 510 mg weekly x2. First dose on 09/26/23.   INTERVAL HISTORY: [name] 74 y.o. male returns to the clinic today for follow-up visit.  Discussed the use of AI scribe software for clinical note transcription with the patient, who gave verbal consent to proceed.  History of Present Illness [name] is a 74 year old male with metastatic non-small cell lung adenocarcinoma who presents with new confusion and imbalance during ongoing immunotherapy.  He was diagnosed with metastatic non-small cell lung adenocarcinoma in August 2025, characterized by a MET exon 14 splice mutation and high PD-L1 expression. He completed concurrent chemoradiation with weekly carboplatin  and paclitaxel  and is currently receiving consolidation durvalumab   every four weeks, with three cycles completed.  On the morning of the visit, he developed acute confusion, disorientation, fatigue, and imbalance, without associated nausea, vomiting, or other acute symptoms. He maintains adequate oral intake and has otherwise tolerated immunotherapy well.  There is a solitary metastasis to the right adrenal gland, for which radiation therapy has been delayed due to the need to hold clopidogrel  for the biopsy and subsequent equipment malfunction.      MEDICAL HISTORY: Past Medical History:  Diagnosis Date   (HFpEF) heart failure with preserved ejection fraction (HCC)    Allergy    Anxiety    Arthritis    Cancer (HCC)    Chronic kidney disease    Depression    Frequent urination    GERD (gastroesophageal reflux disease)    Gout    Headache    mirgaines in the past   Herpes    History of nuclear stress test    Myoview  05/2021: EF 77, normal perfusion, low risk   Hyperlipidemia    Hypertension    IBS (irritable bowel syndrome)    Memory loss    MGUS (monoclonal gammopathy of unknown significance)    Obesity    Pre-diabetes    Stroke (HCC)    Urgency of urination     ALLERGIES:  is allergic to divalproex sodium, lamotrigine, valproic acid, amitriptyline, aspirin , hydrocodone , lisinopril, lithium, morphine , nsaids, oxycodone, oxycodone-acetaminophen , and pork allergy.  MEDICATIONS:  Current Outpatient Medications  Medication Sig Dispense Refill   acetaminophen  (TYLENOL ) 500 MG tablet Take 1-2 tablets (500-1,000 mg total) by mouth every 6 (six) hours as needed for moderate pain. 30 tablet 0   allopurinol  (ZYLOPRIM ) 100 MG tablet Take 150  mg by mouth daily.     amLODipine  (NORVASC ) 10 MG tablet Take 1 tablet (10 mg total) by mouth daily. 90 tablet 3   busPIRone  (BUSPAR ) 30 MG tablet Take 30 mg by mouth 2 (two) times daily.     butalbital-acetaminophen -caffeine (FIORICET) 50-325-40 MG tablet Take 1 tablet by mouth every 6 (six) hours as needed  for headache.     cefdinir  (OMNICEF ) 300 MG capsule Take 1 capsule (300 mg total) by mouth 2 (two) times daily. 14 capsule 0   cetirizine  (ZYRTEC  ALLERGY) 10 MG tablet Take 1 tablet (10 mg total) by mouth daily. 30 tablet 0   clopidogrel  (PLAVIX ) 75 MG tablet Take 1 tablet (75 mg total) by mouth daily. Okay to restart this medication on 08/22/2023. (Patient not taking: Reported on 02/14/2024)     dextromethorphan  (DELSYM ) 30 MG/5ML liquid Take 5 mLs (30 mg total) by mouth 2 (two) times daily as needed for cough. 89 mL 0   ferrous sulfate  (FEROSUL) 325 (65 FE) MG tablet TAKE 1 TABLET (325 MG TOTAL) BY MOUTH DAILY WITH BREAKFAST. 28 tablet 2   fluticasone  (FLONASE ) 50 MCG/ACT nasal spray Place 1 spray into both nostrils daily. 16 g 2   folic acid  (FOLVITE ) 1 MG tablet Take 1 mg by mouth daily.     furosemide  (LASIX ) 40 MG tablet Take 1 tablet (40 mg total) by mouth daily. 90 tablet 3   gabapentin  (NEURONTIN ) 300 MG capsule Take 2 capsules (600 mg total) by mouth 2 (two) times daily. 120 capsule 0   hydrALAZINE  (APRESOLINE ) 10 MG tablet Take 1 tablet (10 mg total) by mouth 2 (two) times daily. 180 tablet 3   HYDROcodone  bit-homatropine (HYCODAN) 5-1.5 MG/5ML syrup Take 5 mLs by mouth every 4 (four) hours as needed (Persistent cough). 120 mL 0   hydrocortisone  (ANUSOL -HC) 2.5 % rectal cream Place 1 Application rectally 2 (two) times daily as needed for hemorrhoids. 30 g 0   hydrocortisone  (ANUSOL -HC) 25 MG suppository Place 1 suppository (25 mg total) rectally 2 (two) times daily as needed for hemorrhoids. 12 suppository 0   hydrocortisone  cream 1 % Apply 1 Application topically.     Hydroxychloroquine  Sulfate 400 MG TABS Take 400 mg by mouth at bedtime.     ketoconazole (NIZORAL) 2 % cream Apply 1 Application topically daily.     Lactobacillus (ACIDOPHILUS) CAPS capsule Take 1 capsule by mouth daily. 100 capsule 0   lidocaine  (LIDODERM ) 5 % Place 1 patch onto the skin daily as needed (for pain- Remove &  Discard patch within 12 hours or as directed by MD). 30 patch 3   lidocaine  (XYLOCAINE ) 5 % ointment Apply 1 Application topically as needed. 35.44 g 0   loperamide (IMODIUM) 2 MG capsule 2 mg as needed.     methocarbamol  (ROBAXIN ) 500 MG tablet Take 1 tablet (500 mg total) by mouth 2 (two) times daily. 20 tablet 0   Multiple Vitamin (MULTIVITAMIN) TABS TAKE 1 TABLET BY MOUTH DAILY. 27 tablet 1   PARoxetine  (PAXIL -CR) 37.5 MG 24 hr tablet Take 1 tablet (37.5 mg total) by mouth at bedtime.     potassium chloride  SA (KLOR-CON  M) 20 MEQ tablet Take 1 tablet (20 mEq total) by mouth 2 (two) times daily. 10 tablet 0   primidone  (MYSOLINE ) 250 MG tablet TAKE 1 TABLET (250 MG TOTAL) BY MOUTH 2 (TWO) TIMES DAILY. 60 tablet 3   QUEtiapine  (SEROQUEL ) 25 MG tablet Take 25 mg by mouth at bedtime.  rosuvastatin  (CRESTOR ) 40 MG tablet Take 40 mg by mouth daily.     traZODone  (DESYREL ) 100 MG tablet Take 100 mg by mouth at bedtime.     valACYclovir  (VALTREX ) 1000 MG tablet Take 1,000 mg by mouth daily.     Vitamin D , Ergocalciferol , (DRISDOL ) 1.25 MG (50000 UNIT) CAPS capsule Take 50,000 Units by mouth every Saturday.     Wound Cleansers (RADIAPLEX EX) Apply topically as needed.     No current facility-administered medications for this visit.    SURGICAL HISTORY:  Past Surgical History:  Procedure Laterality Date   CHOLECYSTECTOMY N/A 12/17/2019   Procedure: LAPAROSCOPIC CHOLECYSTECTOMY WITH ICG INJECTION;  Surgeon: Ebbie Cough, MD;  Location: [facility] OR;  Service: General;  Laterality: N/A;   disckec     IR IMAGING GUIDED PORT INSERTION  09/12/2023   shouler arthroscopy Left    SPINAL FUSION     TONSILLECTOMY     TOTAL KNEE ARTHROPLASTY Right 11/15/2021   Procedure: TOTAL KNEE ARTHROPLASTY;  Surgeon: Melodi Lerner, MD;  Location: [facility] ORS;  Service: Orthopedics;  Laterality: Right;   VIDEO BRONCHOSCOPY WITH ENDOBRONCHIAL NAVIGATION Left 08/21/2023   Procedure: VIDEO BRONCHOSCOPY WITH ENDOBRONCHIAL  NAVIGATION;  Surgeon: Shelah Lamar RAMAN, MD;  Location: [facility] ENDOSCOPY;  Service: Pulmonary;  Laterality: Left;   VIDEO BRONCHOSCOPY WITH ENDOBRONCHIAL ULTRASOUND  08/21/2023   Procedure: BRONCHOSCOPY, WITH EBUS;  Surgeon: Shelah Lamar RAMAN, MD;  Location: [facility] ENDOSCOPY;  Service: Pulmonary;;   VIDEO BRONCHOSCOPY WITH RADIAL ENDOBRONCHIAL ULTRASOUND  08/21/2023   Procedure: VIDEO BRONCHOSCOPY WITH RADIAL ENDOBRONCHIAL ULTRASOUND;  Surgeon: Shelah Lamar RAMAN, MD;  Location: [facility] ENDOSCOPY;  Service: Pulmonary;;   WISDOM TOOTH EXTRACTION      REVIEW OF SYSTEMS:  Constitutional: positive for fatigue Eyes: negative Ears, nose, mouth, throat, and face: negative Respiratory: negative Cardiovascular: negative Gastrointestinal: negative Genitourinary:negative Integument/breast: negative Hematologic/lymphatic: negative Musculoskeletal:negative Neurological: negative Behavioral/Psych: negative Endocrine: negative Allergic/Immunologic: negative   PHYSICAL EXAMINATION: General appearance: alert, cooperative, fatigued, and no distress Head: Normocephalic, without obvious abnormality, atraumatic Neck: no adenopathy, no JVD, supple, symmetrical, trachea midline, and thyroid  not enlarged, symmetric, no tenderness/mass/nodules Lymph nodes: Cervical, supraclavicular, and axillary nodes normal. Resp: clear to auscultation bilaterally Back: symmetric, no curvature. ROM normal. No CVA tenderness. Cardio: regular rate and rhythm, S1, S2 normal, no murmur, click, rub or gallop GI: soft, non-tender; bowel sounds normal; no masses,  no organomegaly Extremities: extremities normal, atraumatic, no cyanosis or edema Neurologic: Alert and oriented X 3, normal strength and tone. Normal symmetric reflexes. Normal coordination and gait  ECOG PERFORMANCE STATUS: 1 - Symptomatic but completely ambulatory  Blood pressure 114/71, pulse 85, temperature 97.9 F (36.6 C), temperature source Temporal, resp. rate 17, height 5' 7  (1.702 m), weight 202 lb 3.2 oz (91.7 kg), SpO2 96%.  LABORATORY DATA: Lab Results  Component Value Date   WBC 6.3 02/21/2024   HGB 12.3 (L) 02/21/2024   HCT 36.8 (L) 02/21/2024   MCV 90.9 02/21/2024   PLT 315 02/21/2024      Chemistry      Component Value Date/Time   NA 139 02/21/2024 0758   NA 140 07/27/2023 1454   K 3.9 02/21/2024 0758   CL 103 02/21/2024 0758   CO2 25 02/21/2024 0758   BUN 12 02/21/2024 0758   BUN 15 07/27/2023 1454   CREATININE 1.44 (H) 02/21/2024 0758      Component Value Date/Time   CALCIUM  9.1 02/21/2024 0758   ALKPHOS 81 02/21/2024 0758   AST 19 02/21/2024 0758  ALT 10 02/21/2024 0758   BILITOT <0.2 02/21/2024 9241       RADIOGRAPHIC STUDIES: CT Angio Chest PE W and/or Wo Contrast Result Date: 02/09/2024 EXAM: CTA of the Chest without and with contrast for PE 02/09/2024 01:54:23 AM TECHNIQUE: CTA of the chest was performed without and with the administration of 50 mL of iohexol  (OMNIPAQUE ) 350 mg/mL intravenous contrast. Multiplanar reformatted images are provided for review. MIP images are provided for review. Automated exposure control, iterative reconstruction, and/or weight based adjustment of the mA/kV was utilized to reduce the radiation dose to as low as reasonably achievable. COMPARISON: 12/29/2023 CLINICAL HISTORY: Pulmonary embolism (PE) suspected, high prob; chest pain, known cancer. Pulmonary embolism suspected, high probability; chest pain; known cancer. FINDINGS: PULMONARY ARTERIES: Pulmonary arteries are adequately opacified for evaluation. No pulmonary embolism. Main pulmonary artery is normal in caliber. MEDIASTINUM: The heart and pericardium demonstrate no acute abnormality. Aortic atherosclerosis. LYMPH NODES: No mediastinal, hilar or axillary lymphadenopathy. LUNGS AND PLEURA: Moderate left pleural effusion. Increasing left upper lobe airspace disease could reflect worsening pneumonia and/or tumor. This could also reflect  post-treatment changes. Peripheral subpleural ground glass nodular opacity in the right lower lobe measures 8 mm compared to 9 mm previously. No pneumothorax. UPPER ABDOMEN: Stable low density lesions within the liver. SOFT TISSUES AND BONES: No acute bone or soft tissue abnormality. IMPRESSION: 1. No evidence of pulmonary embolus. 2. Increasing left upper lobe airspace disease, possibly reflecting worsening pneumonia and/or tumor. This also could reflect post-treatment changes. 3. Moderate left pleural effusion. Electronically signed by: Franky Crease MD 02/09/2024 02:04 AM EST RP Workstation: [id number]   DG Chest Port 1 View Result Date: 02/09/2024 EXAM: 1 VIEW(S) XRAY OF THE CHEST 02/09/2024 12:57:58 AM COMPARISON: 11/18/2023 CLINICAL HISTORY: chest pain, Chest pain. FINDINGS: LINES, TUBES AND DEVICES: Right port-a-cath is unchanged. LUNGS AND PLEURA: Small left pleural effusion with left lower lobe airspace disease, both increasing since prior study. No pneumothorax. HEART AND MEDIASTINUM: Cardiomegaly. BONES AND SOFT TISSUES: No acute osseous abnormality. IMPRESSION: 1. Small left pleural effusion with left lower lobe airspace disease. 2. Cardiomegaly. Electronically signed by: Franky Crease MD 02/09/2024 01:00 AM EST RP Workstation: [id number]      ASSESSMENT AND PLAN: This is a 74 years old African-American male with metastatic non-small cell lung cancer initially diagnosed as stage IIIA (T3, N1, M0) non-small cell lung cancer, adenocarcinoma presented with large left upper lobe lung mass in addition to left hilar lymphadenopathy diagnosed in August 2025.  Molecular study showed positive MET exon 14 splice mutation and PD-L1 expression of 99%. He underwent a course of concurrent chemoradiation with weekly carboplatin  for AUC of 2 and paclitaxel  45 mg/M2.  Status post 5 cycles.  Last dose of treatment was given on 09/26/2023.  He tolerated the treatment well except for the increasing fatigue and cough.   He is currently undergoing consolidation treatment with immunotherapy with nivolumab 1500 mg IV every 4 weeks status post 3 cycles. He continues to tolerate this treatment fairly well. Assessment and Plan Assessment & Plan Metastatic non-small cell lung cancer, adenocarcinoma He is receiving durvalumab  immunotherapy following prior chemoradiation, with good tolerance and no significant immune-related adverse events. Oral intake remains adequate. Disease is likely upstaged to metastatic due to a solitary adrenal lesion, pending biopsy confirmation. Prognosis is guarded, but additional therapeutic options are available if progression occurs. - Continued durvalumab  immunotherapy every four weeks. - Discussed alternative therapies, including oral agents and addition of chemotherapy to immunotherapy, if progression or lack of  response occurs. - Scheduled follow-up in four weeks.  Right adrenal gland lesion (solitary metastasis) A solitary right adrenal lesion is suspicious for metastatic disease. Radiation therapy has been delayed due to logistical issues and the need to discontinue clopidogrel . Biopsy is planned to confirm diagnosis prior to radiation. - Scheduled biopsy of right adrenal gland lesion on Friday to confirm metastatic disease. - If biopsy confirms metastasis, proceed with radiation therapy for local control/palliation, pending technical feasibility and safety off antiplatelet therapy.  Neoplasm-related pain Pain management is addressed through palliative care services, with ongoing active treatment and symptom management support. - Continued palliative care involvement for pain and symptom management.  Chronic kidney disease He has chronic kidney disease with mildly elevated creatinine. Hydration is emphasized to preserve renal function during ongoing therapy. - Advised increased fluid intake to support renal function. The patient was advised to call immediately if he has any other  concerning symptoms in the interval.  The patient voices understanding of current disease status and treatment options and is in agreement with the current care plan.  All questions were answered. The patient knows to call the clinic with any problems, questions or concerns. We can certainly see the patient much sooner if necessary. The total time spent in the appointment was 30 minutes including review of chart and various tests results, discussions about plan of care and coordination of care plan .   Disclaimer: This note was dictated with voice recognition software. Similar sounding words can inadvertently be transcribed and may not be corrected upon review.        "

## 2024-02-22 ENCOUNTER — Other Ambulatory Visit: Payer: Self-pay

## 2024-02-22 ENCOUNTER — Other Ambulatory Visit: Payer: Self-pay | Admitting: Radiology

## 2024-02-23 ENCOUNTER — Ambulatory Visit (HOSPITAL_COMMUNITY): Admission: RE | Admit: 2024-02-23 | Source: Ambulatory Visit

## 2024-02-23 ENCOUNTER — Encounter (HOSPITAL_COMMUNITY): Payer: Self-pay

## 2024-02-23 ENCOUNTER — Other Ambulatory Visit: Payer: Self-pay

## 2024-02-23 DIAGNOSIS — R918 Other nonspecific abnormal finding of lung field: Secondary | ICD-10-CM

## 2024-02-23 LAB — CBC
HCT: 35.6 % — ABNORMAL LOW (ref 39.0–52.0)
Hemoglobin: 12 g/dL — ABNORMAL LOW (ref 13.0–17.0)
MCH: 31.1 pg (ref 26.0–34.0)
MCHC: 33.7 g/dL (ref 30.0–36.0)
MCV: 92.2 fL (ref 80.0–100.0)
Platelets: 286 10*3/uL (ref 150–400)
RBC: 3.86 MIL/uL — ABNORMAL LOW (ref 4.22–5.81)
RDW: 13.4 % (ref 11.5–15.5)
WBC: 6.9 10*3/uL (ref 4.0–10.5)
nRBC: 0 % (ref 0.0–0.2)

## 2024-02-23 LAB — PROTIME-INR
INR: 1.1 (ref 0.8–1.2)
Prothrombin Time: 14.6 s (ref 11.4–15.2)

## 2024-02-23 MED ORDER — MIDAZOLAM HCL (PF) 2 MG/2ML IJ SOLN
INTRAMUSCULAR | Status: AC | PRN
  Administered 2024-02-23: 1 mg via INTRAVENOUS
  Administered 2024-02-23: .5 mg via INTRAVENOUS
  Administered 2024-02-23: 1 mg via INTRAVENOUS
  Administered 2024-02-23: .5 mg via INTRAVENOUS

## 2024-02-23 MED ORDER — FENTANYL CITRATE (PF) 100 MCG/2ML IJ SOLN
INTRAMUSCULAR | Status: AC
  Filled 2024-02-23: qty 4

## 2024-02-23 MED ORDER — ANTICOAGULANT SODIUM CITRATE 4% (200MG/5ML) IV SOLN
5.0000 mL | Status: AC | PRN
  Filled 2024-02-23: qty 5

## 2024-02-23 MED ORDER — SODIUM CHLORIDE 0.9 % IV SOLN: INTRAVENOUS | Status: AC

## 2024-02-23 MED ORDER — MIDAZOLAM HCL 2 MG/2ML IJ SOLN
INTRAMUSCULAR | Status: AC
  Filled 2024-02-23: qty 4

## 2024-02-23 MED ORDER — FENTANYL CITRATE (PF) 100 MCG/2ML IJ SOLN
INTRAMUSCULAR | Status: AC | PRN
  Administered 2024-02-23 (×4): 25 ug via INTRAVENOUS

## 2024-02-23 NOTE — H&P (Signed)
 "     Chief Complaint: Patient was seen in consultation today for Adrenal mass; Rt adrenal mass biopsy at the request of [facility]  Referring Physician(s): Mohamed,Mohamed  Supervising Physician: Karalee Beat  Patient Status: [facility] - Out-pt  History of Present Illness: [name] is a 74 y.o. male   FULL Code status  Known Lung cancer history Aug 2025 Follows with Dr CHRISTELLA Kluver  Right adrenal mass discovered Now scheduled for Rt adrenal mass biopsy Dr Kluver note 2/4: Right adrenal gland lesion (solitary metastasis) A solitary right adrenal lesion is suspicious for metastatic disease. Radiation therapy has been delayed due to logistical issues and the need to discontinue clopidogrel . Biopsy is planned to confirm diagnosis prior to radiation. - Scheduled biopsy of right adrenal gland lesion on Friday to confirm metastatic disease. - If biopsy confirms metastasis, proceed with radiation therapy for local control/palliation, pending technical feasibility and safety off antiplatelet therapy.  New confusion noted 2/4 in Dr Kluver office Pt now answering all questions with good clarity   Past Medical History:  Diagnosis Date   (HFpEF) heart failure with preserved ejection fraction (HCC)    Allergy    Anxiety    Arthritis    Cancer (HCC)    Chronic kidney disease    Depression    Frequent urination    GERD (gastroesophageal reflux disease)    Gout    Headache    mirgaines in the past   Herpes    History of nuclear stress test    Myoview  05/2021: EF 77, normal perfusion, low risk   Hyperlipidemia    Hypertension    IBS (irritable bowel syndrome)    Memory loss    MGUS (monoclonal gammopathy of unknown significance)    Obesity    Pre-diabetes    Stroke [facility])    Urgency of urination     Past Surgical History:  Procedure Laterality Date   CHOLECYSTECTOMY N/A 12/17/2019   Procedure: LAPAROSCOPIC CHOLECYSTECTOMY WITH ICG INJECTION;  Surgeon: Ebbie Cough, MD;  Location: [facility] OR;  Service: General;  Laterality: N/A;   disckec     IR IMAGING GUIDED PORT INSERTION  09/12/2023   shouler arthroscopy Left    SPINAL FUSION     TONSILLECTOMY     TOTAL KNEE ARTHROPLASTY Right 11/15/2021   Procedure: TOTAL KNEE ARTHROPLASTY;  Surgeon: Melodi Lerner, MD;  Location: [facility] ORS;  Service: Orthopedics;  Laterality: Right;   VIDEO BRONCHOSCOPY WITH ENDOBRONCHIAL NAVIGATION Left 08/21/2023   Procedure: VIDEO BRONCHOSCOPY WITH ENDOBRONCHIAL NAVIGATION;  Surgeon: Shelah Lamar RAMAN, MD;  Location: [facility] ENDOSCOPY;  Service: Pulmonary;  Laterality: Left;   VIDEO BRONCHOSCOPY WITH ENDOBRONCHIAL ULTRASOUND  08/21/2023   Procedure: BRONCHOSCOPY, WITH EBUS;  Surgeon: Shelah Lamar RAMAN, MD;  Location: [facility] ENDOSCOPY;  Service: Pulmonary;;   VIDEO BRONCHOSCOPY WITH RADIAL ENDOBRONCHIAL ULTRASOUND  08/21/2023   Procedure: VIDEO BRONCHOSCOPY WITH RADIAL ENDOBRONCHIAL ULTRASOUND;  Surgeon: Shelah Lamar RAMAN, MD;  Location: [facility] ENDOSCOPY;  Service: Pulmonary;;   WISDOM TOOTH EXTRACTION      Allergies: Divalproex sodium, Lamotrigine, Valproic acid, Amitriptyline, Aspirin , Hydrocodone , Lisinopril, Lithium, Morphine , Nsaids, Oxycodone, Oxycodone-acetaminophen , and Pork allergy  Medications: Prior to Admission medications  Medication Sig Start Date End Date Taking? Authorizing Provider  acetaminophen  (TYLENOL ) 500 MG tablet Take 1-2 tablets (500-1,000 mg total) by mouth every 6 (six) hours as needed for moderate pain. 11/18/21   Edmisten, Kristie L, PA  allopurinol  (ZYLOPRIM ) 100 MG tablet Take 150 mg by mouth daily. 09/14/22   [provider]  amLODipine  (NORVASC ) 10 MG tablet Take 1 tablet (10 mg total) by mouth daily. 07/26/23   Delford Maude BROCKS, MD  busPIRone  (BUSPAR ) 30 MG tablet Take 30 mg by mouth 2 (two) times daily.    [provider]  butalbital-acetaminophen -caffeine (FIORICET) 50-325-40 MG tablet Take 1 tablet by mouth every 6 (six) hours as needed for headache.     [provider]  cefdinir  (OMNICEF ) 300 MG capsule Take 1 capsule (300 mg total) by mouth 2 (two) times daily. Patient not taking: Reported on 02/21/2024 02/09/24   Raford Lenis, MD  cetirizine  (ZYRTEC  ALLERGY) 10 MG tablet Take 1 tablet (10 mg total) by mouth daily. 04/25/22   Christopher Savannah, PA-C  clopidogrel  (PLAVIX ) 75 MG tablet Take 1 tablet (75 mg total) by mouth daily. Okay to restart this medication on 08/22/2023. Patient not taking: Reported on 02/14/2024 08/21/23   Shelah Lamar RAMAN, MD  dextromethorphan  (DELSYM ) 30 MG/5ML liquid Take 5 mLs (30 mg total) by mouth 2 (two) times daily as needed for cough. 08/27/23   Randol Simmonds, MD  ferrous sulfate  (FEROSUL) 325 (65 FE) MG tablet TAKE 1 TABLET (325 MG TOTAL) BY MOUTH DAILY WITH BREAKFAST. 01/15/24   Heilingoetter, Cassandra L, PA-C  fluticasone  (FLONASE ) 50 MCG/ACT nasal spray Place 1 spray into both nostrils daily. 09/02/23   Jadine Toribio SQUIBB, MD  folic acid  (FOLVITE ) 1 MG tablet Take 1 mg by mouth daily. 11/10/21   [provider]  furosemide  (LASIX ) 40 MG tablet Take 1 tablet (40 mg total) by mouth daily. 07/13/23   Santo Stanly LABOR, MD  gabapentin  (NEURONTIN ) 300 MG capsule Take 2 capsules (600 mg total) by mouth 2 (two) times daily. 02/07/24   Pickenpack-Cousar, Athena N, NP  hydrALAZINE  (APRESOLINE ) 10 MG tablet Take 1 tablet (10 mg total) by mouth 2 (two) times daily. 11/20/23   Lelon Hamilton T, PA-C  HYDROcodone  bit-homatropine (HYCODAN) 5-1.5 MG/5ML syrup Take 5 mLs by mouth every 4 (four) hours as needed (Persistent cough). 09/01/23   Jadine Toribio SQUIBB, MD  hydrocortisone  (ANUSOL -HC) 2.5 % rectal cream Place 1 Application rectally 2 (two) times daily as needed for hemorrhoids. 02/05/24   Mayer, Jodi R, NP  hydrocortisone  (ANUSOL -HC) 25 MG suppository Place 1 suppository (25 mg total) rectally 2 (two) times daily as needed for hemorrhoids. 02/05/24   Mayer, Jodi R, NP  hydrocortisone  cream 1 % Apply 1 Application topically.  11/23/23   [provider]  Hydroxychloroquine  Sulfate 400 MG TABS Take 400 mg by mouth at bedtime.    [provider]  ketoconazole (NIZORAL) 2 % cream Apply 1 Application topically daily. 07/31/23   [provider]  Lactobacillus (ACIDOPHILUS) CAPS capsule Take 1 capsule by mouth daily. 07/03/23   Vann, Jessica U, DO  lidocaine  (LIDODERM ) 5 % Place 1 patch onto the skin daily as needed (for pain- Remove & Discard patch within 12 hours or as directed by MD). 01/16/24   Pickenpack-Cousar, Athena N, NP  lidocaine  (XYLOCAINE ) 5 % ointment Apply 1 Application topically as needed. 11/18/23   Dasie Faden, MD  loperamide (IMODIUM) 2 MG capsule 2 mg as needed.    [provider]  methocarbamol  (ROBAXIN ) 500 MG tablet Take 1 tablet (500 mg total) by mouth 2 (two) times daily. 12/19/23   Wyatt Leeroy HERO, PA-C  Multiple Vitamin (MULTIVITAMIN) TABS TAKE 1 TABLET BY MOUTH DAILY. 11/15/23   Sherrod Sherrod, MD  PARoxetine  (PAXIL -CR) 37.5 MG 24 hr tablet Take 1 tablet (37.5 mg  total) by mouth at bedtime. 08/21/23   Shelah Lamar RAMAN, MD  potassium chloride  SA (KLOR-CON  M) 20 MEQ tablet Take 1 tablet (20 mEq total) by mouth 2 (two) times daily. 02/09/24   Raford Lenis, MD  primidone  (MYSOLINE ) 250 MG tablet TAKE 1 TABLET (250 MG TOTAL) BY MOUTH 2 (TWO) TIMES DAILY. 12/06/23   Rosemarie Eather RAMAN, MD  QUEtiapine  (SEROQUEL ) 25 MG tablet Take 25 mg by mouth at bedtime.    [provider]  rosuvastatin  (CRESTOR ) 40 MG tablet Take 40 mg by mouth daily.    [provider]  traZODone  (DESYREL ) 100 MG tablet Take 100 mg by mouth at bedtime.    [provider]  valACYclovir  (VALTREX ) 1000 MG tablet Take 1,000 mg by mouth daily.    [provider]  Vitamin D , Ergocalciferol , (DRISDOL ) 1.25 MG (50000 UNIT) CAPS capsule Take 50,000 Units by mouth every Saturday. 09/17/20   [provider]  Wound Cleansers (RADIAPLEX EX) Apply topically as needed.    [provider]     Family History  Problem Relation Age of Onset   Hypertension Mother    Hypertension Father    Hypertension Sister    Hypertension Brother    Cancer Maternal Aunt    Cancer - Ovarian Maternal Grandmother    Diabetes Mellitus I Paternal Grandmother    Hypertension Paternal Grandfather    Cancer Maternal Aunt     Social History   Socioeconomic History   Marital status: Divorced    Spouse name: Not on file   Number of children: 1   Years of education: Masters   Highest education level: Not on file  Occupational History   Occupation: retired    Associate Professor: RETIRED  Tobacco Use   Smoking status: Former    Current packs/day: 0.00    Types: Cigarettes    Quit date: 1990    Years since quitting: 36.1   Smokeless tobacco: Never  Vaping Use   Vaping status: Never Used  Substance and Sexual Activity   Alcohol  use: No    Alcohol /week: 1.0 standard drink of alcohol     Types: 1 Cans of beer per week   Drug use: No   Sexual activity: Never  Other Topics Concern   Not on file  Social History Narrative   Patient lives in Independent living @ [facility]   Patient is right-handed.   Patient does not drink any caffeine.   Social Drivers of Health   Tobacco Use: Medium Risk (02/23/2024)   Patient History    Smoking Tobacco Use: Former    Smokeless Tobacco Use: Never    Passive Exposure: Not on Actuary Strain: Not on file  Food Insecurity: No Food Insecurity (09/07/2023)   Epic    Worried About Programme Researcher, Broadcasting/film/video in the Last Year: Never true    Ran Out of Food in the Last Year: Never true  Transportation Needs: No Transportation Needs (09/07/2023)   Epic    Lack of Transportation (Medical): No    Lack of Transportation (Non-Medical): No  Physical Activity: Not on file  Stress: Not on file  Social Connections: Moderately Isolated (08/28/2023)   Social Connection and Isolation Panel    Frequency of Communication with Friends and Family:  More than three times a week    Frequency of Social Gatherings with Friends and Family: Twice a week    Attends Religious Services: Never    Database Administrator or Organizations:  Yes    Attends Club or Organization Meetings: More than 4 times per year    Marital Status: Divorced  Depression (PHQ2-9): Low Risk (11/29/2023)   Depression (PHQ2-9)    PHQ-2 Score: 0  Alcohol  Screen: Not on file  Housing: Unknown (09/07/2023)   Epic    Unable to Pay for Housing in the Last Year: No    Number of Times Moved in the Last Year: Not on file    Homeless in the Last Year: No  Utilities: Not At Risk (09/07/2023)   Epic    Threatened with loss of utilities: No  Health Literacy: Not on file    Review of Systems: A 12 point ROS discussed and pertinent positives are indicated in the HPI above.  All other systems are negative.  Review of Systems  Constitutional:  Negative for activity change, fatigue, fever and unexpected weight change.  Respiratory:  Negative for cough and shortness of breath.   Cardiovascular:  Negative for chest pain.  Gastrointestinal:  Negative for abdominal pain, diarrhea and nausea.  Musculoskeletal:  Negative for back pain.  Neurological:  Negative for weakness.  Psychiatric/Behavioral:  Negative for behavioral problems and confusion.     Vital Signs: BP 117/74   Pulse 83   Resp 16   Ht 5' 7 (1.702 m)   Wt 199 lb (90.3 kg)   SpO2 93%   BMI 31.17 kg/m   Advance Care Plan: The advanced care plan/surrogate decision maker was discussed at the time of visit and documented in the medical record.    Physical Exam Vitals reviewed.  HENT:     Mouth/Throat:     Mouth: Mucous membranes are moist.  Cardiovascular:     Rate and Rhythm: Normal rate and regular rhythm.     Heart sounds: Normal heart sounds. No murmur heard. Pulmonary:     Effort: Pulmonary effort is normal.     Breath sounds: Normal breath sounds. No wheezing.  Abdominal:     Palpations: Abdomen is  soft.  Musculoskeletal:        General: Normal range of motion.  Skin:    General: Skin is warm.  Neurological:     Mental Status: He is alert and oriented to person, place, and time.  Psychiatric:        Behavior: Behavior normal.     Imaging: CT Angio Chest PE W and/or Wo Contrast Result Date: 02/09/2024 EXAM: CTA of the Chest without and with contrast for PE 02/09/2024 01:54:23 AM TECHNIQUE: CTA of the chest was performed without and with the administration of 50 mL of iohexol  (OMNIPAQUE ) 350 mg/mL intravenous contrast. Multiplanar reformatted images are provided for review. MIP images are provided for review. Automated exposure control, iterative reconstruction, and/or weight based adjustment of the mA/kV was utilized to reduce the radiation dose to as low as reasonably achievable. COMPARISON: 12/29/2023 CLINICAL HISTORY: Pulmonary embolism (PE) suspected, high prob; chest pain, known cancer. Pulmonary embolism suspected, high probability; chest pain; known cancer. FINDINGS: PULMONARY ARTERIES: Pulmonary arteries are adequately opacified for evaluation. No pulmonary embolism. Main pulmonary artery is normal in caliber. MEDIASTINUM: The heart and pericardium demonstrate no acute abnormality. Aortic atherosclerosis. LYMPH NODES: No mediastinal, hilar or axillary lymphadenopathy. LUNGS AND PLEURA: Moderate left pleural effusion. Increasing left upper lobe airspace disease could reflect worsening pneumonia and/or tumor. This could also reflect post-treatment changes. Peripheral subpleural ground glass nodular opacity in the right lower lobe measures 8 mm compared to 9 mm previously. No pneumothorax. UPPER ABDOMEN:  Stable low density lesions within the liver. SOFT TISSUES AND BONES: No acute bone or soft tissue abnormality. IMPRESSION: 1. No evidence of pulmonary embolus. 2. Increasing left upper lobe airspace disease, possibly reflecting worsening pneumonia and/or tumor. This also could reflect  post-treatment changes. 3. Moderate left pleural effusion. Electronically signed by: Franky Crease MD 02/09/2024 02:04 AM EST RP Workstation: [id number]   DG Chest Port 1 View Result Date: 02/09/2024 EXAM: 1 VIEW(S) XRAY OF THE CHEST 02/09/2024 12:57:58 AM COMPARISON: 11/18/2023 CLINICAL HISTORY: chest pain, Chest pain. FINDINGS: LINES, TUBES AND DEVICES: Right port-a-cath is unchanged. LUNGS AND PLEURA: Small left pleural effusion with left lower lobe airspace disease, both increasing since prior study. No pneumothorax. HEART AND MEDIASTINUM: Cardiomegaly. BONES AND SOFT TISSUES: No acute osseous abnormality. IMPRESSION: 1. Small left pleural effusion with left lower lobe airspace disease. 2. Cardiomegaly. Electronically signed by: Franky Crease MD 02/09/2024 01:00 AM EST RP Workstation: [id number]    Labs:  CBC: Recent Labs    12/26/23 1113 01/23/24 1149 02/09/24 0053 02/21/24 0758  WBC 4.7 5.6 7.1 6.3  HGB 11.1* 12.0* 11.2* 12.3*  HCT 32.8* 35.4* 33.6* 36.8*  PLT 285 307 238 315    COAGS: Recent Labs    08/27/23 1316 08/27/23 2357  INR 1.0 1.1    BMP: Recent Labs    12/26/23 1113 01/23/24 1149 02/09/24 0053 02/21/24 0758  NA 139 137 137 139  K 3.5 3.7 3.4* 3.9  CL 104 102 100 103  CO2 25 27 26 25   GLUCOSE 104* 85 109* 94  BUN 11 14 11 12   CALCIUM  9.1 9.3 8.4* 9.1  CREATININE 1.26* 1.31* 1.27* 1.44*  GFRNONAA >60 58* >60 52*    LIVER FUNCTION TESTS: Recent Labs    12/26/23 1113 01/23/24 1149 02/09/24 0053 02/21/24 0758  BILITOT <0.2 0.4 <0.2 <0.2  AST 18 21 17 19   ALT 7 9 5 10   ALKPHOS 88 79 71 81  PROT 7.5 7.7 6.9 7.7  ALBUMIN 4.0 4.3 3.6 4.2    TUMOR MARKERS: No results for input(s): AFPTM, CEA, CA199, CHROMGRNA in the last 8760 hours.  Assessment and Plan:  Scheduled for Right adrenal mass biopsy Risks and benefits of Rt adrenal mass biopsy was discussed with the patient and/or patient's family including, but not limited to bleeding,  infection, damage to adjacent structures or low yield requiring additional tests.  All of the questions were answered and there is agreement to proceed.  Consent signed and in chart.  Thank you for this interesting consult.  I greatly enjoyed meeting [name] and look forward to participating in their care.  A copy of this report was sent to the requesting provider on this date.  Electronically Signed: Sharlet DELENA Candle, PA-C 02/23/2024, 9:03 AM   I spent a total of  30 Minutes   in face to face in clinical consultation, greater than 50% of which was counseling/coordinating care for Rt adrenal mass biopsy  "

## 2024-02-23 NOTE — Procedures (Signed)
 Interventional Radiology Procedure Note  Procedure: CT guided biopsy of RIGHT ADRENAL gland  Complications: None  Estimated Blood Loss: None  Recommendations: - Bedrest x 1 hr - DC home   Signed,  Wilkie LOIS Lent, MD

## 2024-02-23 NOTE — Discharge Instructions (Signed)
 Drink plenty of fluids over the next 2-3 days.  Needle Biopsy, Care After These instructions tell you how to care for yourself after your procedure. Your doctor may give you more instructions. Call your doctor if you have any problems or questions. What can I expect after the procedure? After a needle biopsy, it is common to have these things at the puncture site: Soreness. Bruising. Mild pain. These things should go away after a few days. Follow these instructions at home: Puncture site care  Wash your hands with soap and water for at least 20 seconds before and after you change your bandage (dressing). If you cannot use soap and water, use hand sanitizer. Follow instructions from your doctor about how to take care of your puncture site. This includes: Remove Dressing in 24 hours.  You May Shower In 24 Hours. DO NOT SCRUB over site.  Check your puncture site every day for signs of infection. Check for: Redness, swelling, or more pain. Fluid or blood. Warmth. Pus or a bad smell. General instructions Go back to your normal activities when your doctor says that it is safe. Ask if there is anything you cannot do as you heal. Take over-the-counter and prescription medicines only as told by your doctor. Do not take baths, swim, or use a hot tub for at least 5 days.  Keep all follow-up visits. Ask if you need an appointment to get your biopsy results. Contact a doctor if: You have a fever. You have redness, swelling, or more pain at the puncture site, and it lasts longer than a few days. You have fluid, blood, or pus coming from the puncture site. Your puncture site feels warm. Get help right away if: You have very bad bleeding from the puncture site. Summary After the procedure, it is common to have soreness, bruising, or mild pain at the puncture site. Check your puncture site every day for signs of infection, such as redness, swelling, or more pain. Get help right away if you have  very bad bleeding from your puncture site. This information is not intended to replace advice given to you by your health care provider. Make sure you discuss any questions you have with your health care provider. Document Revised: 06/24/2020 Document Reviewed: 06/24/2020 Elsevier Patient Education  2024 [employer].

## 2024-03-01 ENCOUNTER — Ambulatory Visit (HOSPITAL_BASED_OUTPATIENT_CLINIC_OR_DEPARTMENT_OTHER)

## 2024-03-19 ENCOUNTER — Inpatient Hospital Stay: Admitting: Internal Medicine

## 2024-03-19 ENCOUNTER — Inpatient Hospital Stay

## 2024-04-17 ENCOUNTER — Inpatient Hospital Stay

## 2024-04-17 ENCOUNTER — Inpatient Hospital Stay: Attending: Internal Medicine

## 2024-04-17 ENCOUNTER — Inpatient Hospital Stay: Admitting: Internal Medicine

## 2024-05-14 ENCOUNTER — Ambulatory Visit: Admitting: Internal Medicine

## 2024-05-15 ENCOUNTER — Inpatient Hospital Stay

## 2024-05-15 ENCOUNTER — Inpatient Hospital Stay: Admitting: Internal Medicine

## 2024-11-28 ENCOUNTER — Ambulatory Visit: Admitting: Neurology
